# Patient Record
Sex: Female | Born: 1945
Health system: Southern US, Community
[De-identification: ages and names within clinical notes are randomized; demographics above are authoritative.]

## PROBLEM LIST (undated history)

## (undated) DIAGNOSIS — G4733 Obstructive sleep apnea (adult) (pediatric): Secondary | ICD-10-CM

## (undated) DIAGNOSIS — R931 Abnormal findings on diagnostic imaging of heart and coronary circulation: Secondary | ICD-10-CM

## (undated) DIAGNOSIS — I447 Left bundle-branch block, unspecified: Secondary | ICD-10-CM

## (undated) DIAGNOSIS — N393 Stress incontinence (female) (male): Secondary | ICD-10-CM

## (undated) DIAGNOSIS — J302 Other seasonal allergic rhinitis: Secondary | ICD-10-CM

## (undated) DIAGNOSIS — C801 Malignant (primary) neoplasm, unspecified: Secondary | ICD-10-CM

## (undated) DIAGNOSIS — F32A Depression, unspecified: Secondary | ICD-10-CM

## (undated) DIAGNOSIS — I2510 Atherosclerotic heart disease of native coronary artery without angina pectoris: Secondary | ICD-10-CM

## (undated) DIAGNOSIS — E785 Hyperlipidemia, unspecified: Secondary | ICD-10-CM

## (undated) DIAGNOSIS — I420 Dilated cardiomyopathy: Secondary | ICD-10-CM

## (undated) DIAGNOSIS — Z87442 Personal history of urinary calculi: Secondary | ICD-10-CM

## (undated) DIAGNOSIS — F419 Anxiety disorder, unspecified: Secondary | ICD-10-CM

## (undated) DIAGNOSIS — F329 Major depressive disorder, single episode, unspecified: Secondary | ICD-10-CM

## (undated) DIAGNOSIS — R413 Other amnesia: Secondary | ICD-10-CM

## (undated) DIAGNOSIS — D649 Anemia, unspecified: Secondary | ICD-10-CM

## (undated) DIAGNOSIS — I10 Essential (primary) hypertension: Secondary | ICD-10-CM

## (undated) DIAGNOSIS — R4586 Emotional lability: Secondary | ICD-10-CM

## (undated) DIAGNOSIS — H269 Unspecified cataract: Secondary | ICD-10-CM

## (undated) DIAGNOSIS — I2699 Other pulmonary embolism without acute cor pulmonale: Secondary | ICD-10-CM

## (undated) DIAGNOSIS — J45909 Unspecified asthma, uncomplicated: Secondary | ICD-10-CM

## (undated) DIAGNOSIS — I5042 Chronic combined systolic (congestive) and diastolic (congestive) heart failure: Secondary | ICD-10-CM

## (undated) DIAGNOSIS — R7303 Prediabetes: Secondary | ICD-10-CM

## (undated) DIAGNOSIS — E039 Hypothyroidism, unspecified: Secondary | ICD-10-CM

## (undated) DIAGNOSIS — R0989 Other specified symptoms and signs involving the circulatory and respiratory systems: Secondary | ICD-10-CM

## (undated) DIAGNOSIS — E162 Hypoglycemia, unspecified: Secondary | ICD-10-CM

## (undated) DIAGNOSIS — I252 Old myocardial infarction: Secondary | ICD-10-CM

## (undated) DIAGNOSIS — G4730 Sleep apnea, unspecified: Secondary | ICD-10-CM

## (undated) HISTORY — DX: Hyperlipidemia, unspecified: E78.5

## (undated) HISTORY — DX: Unspecified cataract: H26.9

## (undated) HISTORY — PX: CATARACT EXTRACTION W/ INTRAOCULAR LENS  IMPLANT, BILATERAL: SHX1307

## (undated) HISTORY — PX: DILATION AND CURETTAGE OF UTERUS: SHX78

## (undated) HISTORY — DX: Hypoglycemia, unspecified: E16.2

## (undated) HISTORY — DX: Other amnesia: R41.3

## (undated) HISTORY — PX: CARDIAC CATHETERIZATION: SHX172

## (undated) HISTORY — DX: Other pulmonary embolism without acute cor pulmonale: I26.99

## (undated) HISTORY — PX: EYE SURGERY: SHX253

## (undated) HISTORY — PX: TRANSTHORACIC ECHOCARDIOGRAM: SHX275

## (undated) HISTORY — DX: Emotional lability: R45.86

---

## 1979-02-28 HISTORY — PX: NASAL SEPTUM SURGERY: SHX37

## 1983-06-30 HISTORY — PX: VAGINAL HYSTERECTOMY: SUR661

## 1983-06-30 HISTORY — PX: ABDOMINAL HYSTERECTOMY: SHX81

## 1998-03-07 ENCOUNTER — Other Ambulatory Visit: Admission: RE | Admit: 1998-03-07 | Discharge: 1998-03-07 | Payer: Self-pay | Admitting: *Deleted

## 1998-12-22 ENCOUNTER — Ambulatory Visit: Admission: RE | Admit: 1998-12-22 | Discharge: 1998-12-22 | Payer: Self-pay | Admitting: Otolaryngology

## 1999-03-11 ENCOUNTER — Inpatient Hospital Stay (HOSPITAL_COMMUNITY): Admission: EM | Admit: 1999-03-11 | Discharge: 1999-03-12 | Payer: Self-pay | Admitting: *Deleted

## 1999-03-11 ENCOUNTER — Encounter: Payer: Self-pay | Admitting: Cardiovascular Disease

## 1999-06-23 ENCOUNTER — Encounter: Payer: Self-pay | Admitting: Emergency Medicine

## 1999-06-23 ENCOUNTER — Emergency Department (HOSPITAL_COMMUNITY): Admission: EM | Admit: 1999-06-23 | Discharge: 1999-06-23 | Payer: Self-pay | Admitting: Emergency Medicine

## 1999-06-24 ENCOUNTER — Encounter: Payer: Self-pay | Admitting: *Deleted

## 1999-06-24 ENCOUNTER — Emergency Department (HOSPITAL_COMMUNITY): Admission: EM | Admit: 1999-06-24 | Discharge: 1999-06-24 | Payer: Self-pay | Admitting: Emergency Medicine

## 1999-09-03 ENCOUNTER — Encounter: Payer: Self-pay | Admitting: *Deleted

## 1999-09-03 ENCOUNTER — Observation Stay (HOSPITAL_COMMUNITY): Admission: EM | Admit: 1999-09-03 | Discharge: 1999-09-05 | Payer: Self-pay | Admitting: *Deleted

## 1999-10-13 ENCOUNTER — Other Ambulatory Visit: Admission: RE | Admit: 1999-10-13 | Discharge: 1999-10-13 | Payer: Self-pay | Admitting: *Deleted

## 2000-08-26 ENCOUNTER — Emergency Department (HOSPITAL_COMMUNITY): Admission: EM | Admit: 2000-08-26 | Discharge: 2000-08-26 | Payer: Self-pay | Admitting: *Deleted

## 2000-08-27 ENCOUNTER — Encounter: Payer: Self-pay | Admitting: *Deleted

## 2000-08-30 ENCOUNTER — Encounter: Payer: Self-pay | Admitting: *Deleted

## 2000-08-31 ENCOUNTER — Observation Stay (HOSPITAL_COMMUNITY): Admission: RE | Admit: 2000-08-31 | Discharge: 2000-09-01 | Payer: Self-pay | Admitting: *Deleted

## 2000-08-31 ENCOUNTER — Encounter: Payer: Self-pay | Admitting: *Deleted

## 2000-08-31 HISTORY — PX: OTHER SURGICAL HISTORY: SHX169

## 2000-09-06 ENCOUNTER — Encounter: Admission: RE | Admit: 2000-09-06 | Discharge: 2000-09-06 | Payer: Self-pay | Admitting: *Deleted

## 2000-09-06 ENCOUNTER — Encounter: Payer: Self-pay | Admitting: *Deleted

## 2000-09-25 ENCOUNTER — Ambulatory Visit (HOSPITAL_COMMUNITY): Admission: RE | Admit: 2000-09-25 | Discharge: 2000-09-25 | Payer: Self-pay | Admitting: *Deleted

## 2000-09-25 ENCOUNTER — Encounter: Payer: Self-pay | Admitting: *Deleted

## 2001-01-06 ENCOUNTER — Encounter: Admission: RE | Admit: 2001-01-06 | Discharge: 2001-01-06 | Payer: Self-pay | Admitting: *Deleted

## 2001-01-06 ENCOUNTER — Encounter: Payer: Self-pay | Admitting: *Deleted

## 2002-08-01 ENCOUNTER — Other Ambulatory Visit: Admission: RE | Admit: 2002-08-01 | Discharge: 2002-08-01 | Payer: Self-pay | Admitting: Obstetrics and Gynecology

## 2003-10-16 ENCOUNTER — Emergency Department (HOSPITAL_COMMUNITY): Admission: EM | Admit: 2003-10-16 | Discharge: 2003-10-16 | Payer: Self-pay | Admitting: Emergency Medicine

## 2003-12-19 ENCOUNTER — Ambulatory Visit (HOSPITAL_COMMUNITY): Admission: RE | Admit: 2003-12-19 | Discharge: 2003-12-19 | Payer: Self-pay | Admitting: Internal Medicine

## 2003-12-27 ENCOUNTER — Encounter: Payer: Self-pay | Admitting: Internal Medicine

## 2003-12-27 ENCOUNTER — Ambulatory Visit (HOSPITAL_BASED_OUTPATIENT_CLINIC_OR_DEPARTMENT_OTHER): Admission: RE | Admit: 2003-12-27 | Discharge: 2003-12-27 | Payer: Self-pay | Admitting: Internal Medicine

## 2004-05-28 ENCOUNTER — Emergency Department (HOSPITAL_COMMUNITY): Admission: EM | Admit: 2004-05-28 | Discharge: 2004-05-29 | Payer: Self-pay | Admitting: Emergency Medicine

## 2004-08-22 ENCOUNTER — Inpatient Hospital Stay (HOSPITAL_COMMUNITY): Admission: AD | Admit: 2004-08-22 | Discharge: 2004-08-25 | Payer: Self-pay | Admitting: Cardiology

## 2004-08-26 ENCOUNTER — Emergency Department (HOSPITAL_COMMUNITY): Admission: EM | Admit: 2004-08-26 | Discharge: 2004-08-26 | Payer: Self-pay | Admitting: Emergency Medicine

## 2004-09-24 ENCOUNTER — Emergency Department (HOSPITAL_COMMUNITY): Admission: EM | Admit: 2004-09-24 | Discharge: 2004-09-24 | Payer: Self-pay | Admitting: Emergency Medicine

## 2005-06-16 ENCOUNTER — Ambulatory Visit: Payer: Self-pay | Admitting: Family Medicine

## 2006-04-26 ENCOUNTER — Emergency Department (HOSPITAL_COMMUNITY): Admission: EM | Admit: 2006-04-26 | Discharge: 2006-04-26 | Payer: Self-pay | Admitting: Emergency Medicine

## 2009-06-29 DIAGNOSIS — I219 Acute myocardial infarction, unspecified: Secondary | ICD-10-CM

## 2009-06-29 HISTORY — DX: Acute myocardial infarction, unspecified: I21.9

## 2009-11-27 DIAGNOSIS — I252 Old myocardial infarction: Secondary | ICD-10-CM

## 2009-11-27 HISTORY — DX: Old myocardial infarction: I25.2

## 2009-12-07 ENCOUNTER — Inpatient Hospital Stay (HOSPITAL_COMMUNITY): Admission: EM | Admit: 2009-12-07 | Discharge: 2009-12-09 | Payer: Self-pay | Admitting: Emergency Medicine

## 2009-12-08 ENCOUNTER — Encounter (INDEPENDENT_AMBULATORY_CARE_PROVIDER_SITE_OTHER): Payer: Self-pay | Admitting: Cardiovascular Disease

## 2009-12-08 ENCOUNTER — Ambulatory Visit: Payer: Self-pay | Admitting: Vascular Surgery

## 2009-12-11 ENCOUNTER — Emergency Department (HOSPITAL_COMMUNITY): Admission: EM | Admit: 2009-12-11 | Discharge: 2009-12-12 | Payer: Self-pay | Admitting: Emergency Medicine

## 2010-09-15 LAB — CBC
HCT: 30.9 % — ABNORMAL LOW (ref 36.0–46.0)
HCT: 34.8 % — ABNORMAL LOW (ref 36.0–46.0)
Hemoglobin: 10.3 g/dL — ABNORMAL LOW (ref 12.0–15.0)
Hemoglobin: 11.8 g/dL — ABNORMAL LOW (ref 12.0–15.0)
MCHC: 33 g/dL (ref 30.0–36.0)
MCHC: 33.1 g/dL (ref 30.0–36.0)
MCHC: 33.4 g/dL (ref 30.0–36.0)
MCV: 79.8 fL (ref 78.0–100.0)
MCV: 80.6 fL (ref 78.0–100.0)
Platelets: 206 10*3/uL (ref 150–400)
Platelets: 246 10*3/uL (ref 150–400)
RBC: 5.24 MIL/uL — ABNORMAL HIGH (ref 3.87–5.11)
RDW: 14.6 % (ref 11.5–15.5)
RDW: 14.6 % (ref 11.5–15.5)
RDW: 14.7 % (ref 11.5–15.5)
WBC: 7.4 10*3/uL (ref 4.0–10.5)

## 2010-09-15 LAB — HEPARIN LEVEL (UNFRACTIONATED)
Heparin Unfractionated: 0.19 IU/mL — ABNORMAL LOW (ref 0.30–0.70)
Heparin Unfractionated: 0.37 IU/mL (ref 0.30–0.70)

## 2010-09-15 LAB — DIFFERENTIAL
Band Neutrophils: 0 % (ref 0–10)
Basophils Relative: 2 % — ABNORMAL HIGH (ref 0–1)
Eosinophils Absolute: 0.1 10*3/uL (ref 0.0–0.7)
Eosinophils Absolute: 0.4 10*3/uL (ref 0.0–0.7)
Eosinophils Relative: 1 % (ref 0–5)
Eosinophils Relative: 5 % (ref 0–5)
Metamyelocytes Relative: 0 %
Monocytes Absolute: 0.1 10*3/uL (ref 0.1–1.0)
Monocytes Relative: 2 % — ABNORMAL LOW (ref 3–12)
Monocytes Relative: 8 % (ref 3–12)
Neutrophils Relative %: 72 % (ref 43–77)

## 2010-09-15 LAB — BASIC METABOLIC PANEL
BUN: 11 mg/dL (ref 6–23)
BUN: 8 mg/dL (ref 6–23)
CO2: 25 mEq/L (ref 19–32)
CO2: 30 mEq/L (ref 19–32)
Calcium: 8.8 mg/dL (ref 8.4–10.5)
Calcium: 8.8 mg/dL (ref 8.4–10.5)
Chloride: 111 mEq/L (ref 96–112)
Creatinine, Ser: 0.68 mg/dL (ref 0.4–1.2)
Creatinine, Ser: 0.78 mg/dL (ref 0.4–1.2)
Creatinine, Ser: 0.81 mg/dL (ref 0.4–1.2)
GFR calc Af Amer: >60 mL/min (ref >60)
GFR calc non Af Amer: >60 mL/min (ref >60)
GFR calc non Af Amer: >60 mL/min (ref >60)
Glucose, Bld: 105 mg/dL — ABNORMAL HIGH (ref 70–99)
Glucose, Bld: 80 mg/dL (ref 70–99)
Potassium: 2.5 mEq/L — CL (ref 3.5–5.1)
Sodium: 143 mEq/L (ref 135–145)

## 2010-09-15 LAB — POCT CARDIAC MARKERS
Myoglobin, poc: 298 ng/mL (ref 12–200)
Myoglobin, poc: 45.6 ng/mL (ref 12–200)

## 2010-09-15 LAB — GLUCOSE, CAPILLARY: Glucose-Capillary: 109 mg/dL — ABNORMAL HIGH (ref 70–99)

## 2010-09-15 LAB — URINALYSIS, ROUTINE W REFLEX MICROSCOPIC
Ketones, ur: NEGATIVE mg/dL
Nitrite: NEGATIVE
Protein, ur: NEGATIVE mg/dL
Urobilinogen, UA: 0.2 mg/dL (ref 0.0–1.0)
pH: 7.5 (ref 5.0–8.0)

## 2010-09-15 LAB — CARDIAC PANEL(CRET KIN+CKTOT+MB+TROPI)
Relative Index: 8.5 — ABNORMAL HIGH (ref 0.0–2.5)
Total CK: 300 U/L — ABNORMAL HIGH (ref 7–177)
Total CK: 531 U/L — ABNORMAL HIGH (ref 7–177)
Troponin I: 5.05 ng/mL (ref 0.00–0.06)
Troponin I: 5.33 ng/mL (ref 0.00–0.06)

## 2010-09-15 LAB — LIPID PANEL
LDL Cholesterol: 120 mg/dL — ABNORMAL HIGH (ref 0–99)
Total CHOL/HDL Ratio: 3.8 RATIO
VLDL: 24 mg/dL (ref 0–40)

## 2010-09-15 LAB — CULTURE, BLOOD (ROUTINE X 2): Culture: NO GROWTH

## 2010-09-15 LAB — HEPATIC FUNCTION PANEL
AST: 34 U/L (ref 0–37)
Albumin: 3.6 g/dL (ref 3.5–5.2)
Alkaline Phosphatase: 52 U/L (ref 39–117)
Bilirubin, Direct: 0.1 mg/dL (ref 0.0–0.3)
Total Bilirubin: 0.5 mg/dL (ref 0.3–1.2)

## 2010-09-15 LAB — D-DIMER, QUANTITATIVE: D-Dimer, Quant: 0.5 ug/mL-FEU — ABNORMAL HIGH (ref 0.00–0.48)

## 2010-09-15 LAB — TROPONIN I: Troponin I: 3.28 ng/mL (ref 0.00–0.06)

## 2010-09-15 LAB — RAPID URINE DRUG SCREEN, HOSP PERFORMED
Cocaine: NOT DETECTED
Tetrahydrocannabinol: NOT DETECTED

## 2010-09-15 LAB — CK TOTAL AND CKMB (NOT AT ARMC): CK, MB: 20.6 ng/mL (ref 0.3–4.0)

## 2010-09-15 LAB — HEMOGLOBIN A1C: Hgb A1c MFr Bld: 6.3 % — ABNORMAL HIGH (ref <5.7)

## 2010-09-15 LAB — PROTIME-INR: INR: 1.02 (ref 0.00–1.49)

## 2010-09-15 LAB — TSH: TSH: 0.637 u[IU]/mL (ref 0.350–4.500)

## 2010-11-14 NOTE — Op Note (Signed)
[facility]  Patient:    Laura [name], Laura [name]                      MRN: [id number] Proc. Date: 08/31/00 Adm. Date:  [id number] Disc. Date: [id number] Attending:  Dalbert Mayotte                           Operative Report  PREOPERATIVE DIAGNOSES: 1. Right ureteral calculus. 2. Left renal calculus. 3. Chronic back pain secondary to degenerative spine disease. 4. Sleep apnea.  POSTOPERATIVE DIAGNOSES: 1. Right ureteral calculus. 2. Left renal calculus. 3. Chronic back pain secondary to degenerative spine disease. 4. Sleep apnea.  OPERATIVE PROCEDURES: 1. Cystoscopy. 2. Right ureteroscopic stone extraction. 3. Insertion of double J ureteral stent (#6 Kwort).  ANESTHESIA:  General.  DESCRIPTION OF PROCEDURE:  This patient age 65, was brought to the operating room and underwent successful induction of general anesthesia and was prepped and draped in the lithotomy position. Using the #22 cystourethroscope, the #70 and #12 degree lenses, the bladder was carefully inspected and no stone, tumor or ulcer was noted in the bladder. She had mild hyperemia and erythema of the bladder. The ureteral orifices appeared normal. The right ureteral orifice accepted an 0.38 Glidewire through a #5 open ended catheter and it passed easily under fluoroscopic control to the region of the right renal pelvis. The open ended catheter was advanced beyond the stone which was about 10 cm above the right ureterovesical junction and dye was injected and outlined a dilated upper ureter and renal pelvis with some blooding of the calices. The open ended catheter was then used to introduce an 0.35 spring guidewire which was used as a safety wire and was attached to the drapes with a forceps. After dilating the lower ureter for 5 minutes with a UroMax 10 cm x 15 mm balloon. The 9.5 short ureteroscope was then passed alongside the safety wire up to the stone, the stone was engaged  with the 15 mm Pfister-Scwartz basket and extracted on the first pass. The safety wire was then used to introduce the open ended catheter and a pyeloureterogram was then performed which showed no extravasation and again showed dilation of the upper ureter and renal collecting system on the right, but no extravasation was noted. The open ended catheter was then used to insert an 0.38 Glidewire and the over 0.38 Glidewire was passed a #6 Kwort type double J ureteral stent under fluoroscopic control and it was noted to have a good curl in the right renal pelvis and distally cystoscopically, it was noted to have a good curl in the bladder. The pull-string was taped to the suprapubic area. The patients bladder was emptied and she was returned to the recovery room.  DISPOSITION:  The plan is to observe the patient overnight since she has sleep apnea. We will give her Tylox for pain, IV fluids, Cipro and if she is stable and afebrile, she will go home September 01, 2000 and return to the office in a few days to have her stent removed. DD:  08/31/00 TD:  09/01/00 Job: [id number]

## 2010-11-14 NOTE — Discharge Summary (Signed)
NAMESHADI, LARNER               ACCOUNT NO.:  [id number]   MEDICAL RECORD NO.:  [id number]          PATIENT TYPE:  INP   LOCATION:  2024                         FACILITY:  [facility]   PHYSICIAN:  Cristy Hilts. Jacinto Halim, MD       DATE OF BIRTH:  17-Jun-1946   DATE OF ADMISSION:  08/22/2004  DATE OF DISCHARGE:  08/25/2004                                 DISCHARGE SUMMARY   Ms. Danalee Flath is a 65 year old female patient seen in the office by Dr.  Elsie Lincoln and Abelino Derrick, P.A.  She was having chest pain intermittently  for several days.  Thus, she was admitted to the hospital.  She ruled out  for MI.  She went on to have a cardiac catheterization on August 25, 2004.  She was found to have normal coronaries and normal LV function.  She did  have some high blood pressure on her admission.  She was put on Norvasc.  She had a lot of complaints with her back pain, which was a chronic issue.  Her total cholesterol was found to be very elevated.  Thus, she was put on  Lipitor medication.  On the day of discharge, she had been perclosed.  Her  discharge is pending getting up and walking and finishing her bed rest.   LABORATORIES:  CK-MBs were negative.  Her TSH was 0.762.  Her sodium was  138, potassium 3.7, chloride 105, glucose 105, BUN 11 and creatinine 0.7.  Her total cholesterol was 252, her LDL was 168, her LDL was 56 and her  triglycerides were 142.   DISCHARGE MEDICATIONS:  1.  Protonix 4 mg one time per day.  2.  Lipitor 40 mg one time per day.  3.  Motrin or ibuprofen 400 mg three times a day for four days.  4.  Norvasc 5 mg one time per day.   ACTIVITY:  She is to do lifting, pushing, pulling or other strenuous  activity x 1 week.   WOUND CARE:  If she has any problems with her groin, please call.  She  should do not tub baths or sitting in pool of water for one week.   FOLLOWUP:  She will follow up on August 28, 2004, for a groin check.   DISCHARGE DIAGNOSES:  1.  Chest pain,  noncardiac in origin with normal coronary arteries.  2.  Hypertension.  3.  Hyperlipidemia.  4.  Asthma.  5.  Chronic back pain.      BB/MEDQ  D:  08/25/2004  T:  08/25/2004  Job:  [id number]   cc:   Ike Bene, M.D.  [address]  [place]  [place] [address]  Fax: [phone]

## 2010-11-14 NOTE — Discharge Summary (Signed)
[place]  Patient:    Laura [name], Laura [name]                      MRN: [id number] Adm. Date:  [id number] Disc. Date: [id number] Attending:  Ruta Hinds Dictator:   [facility] Celina, P.A. CCDaleen Bo R. Felipa Eth, M.D.             Richard A. Alanda Amass, M.D.                           Discharge Summary  ADMISSION DIAGNOSES: 1. Chest pain. 2. Hypertension. 3. Hyperlipidemia. 4. Obesity. 5. Anxiety disorder. 6. Sleep apnea. 7. Status post total abdominal hysterectomy. 8. Nephrolithiasis.  DISCHARGE DIAGNOSES: 1. Chest pain. 2. Hypertension. 3. Hyperlipidemia. 4. Obesity. 5. Anxiety disorder. 6. Sleep apnea. 7. Status post total abdominal hysterectomy. 8. Nephrolithiasis. 9. Status post cardiac catheterization with normal coronary arteries.  HISTORY OF PRESENT ILLNESS:  Laura [name] is a 65 year old white female with no known heart disease, with a negative cardiac for ischemia and scar in September, 2000, who presented to [facility] Emergency Room on September 03, 1999, with complaints of chest pain.  She stated that she was on her way to work thriving when she had sudden onset of heavy sharp pain in her chest.  She said it was a 10/10.  It was continuous. She sat up straight without relief.  She continued driving to work.  Once at work, he called EMS.  She was given aspirin 81 mg x 4 and one nitroglycerin with some relief of her symptoms.  This past fall, she had some symptoms of chest pain.  She had heaviness only. he then had a negative Cardiolite.  Since the fall, she had done well.  She saw Dr. Felipa Eth on the previous last Monday and had been started on antihypertensives nd statins.  On exam at that time, her blood pressure was 165/95, pulse 73.  Her exam was essentially benign.  Her EKG revealed normal sinus rhythm with nonspecific ST changes.  Her enzymes were pending at that time.  She was started on  aspirin products, heparin, as well as Imdur 30.  As well, she was started on Prevacid 30 b.i.d. for GI prophylaxis.  Otherwise, she was continued on her general home medications and was also started on Altace 2.5 q.d. and Lopressor 12.5 b.i.d. he was planned for cardiac catheterization the following day.  HOSPITAL COURSE:  On September 04, 1999, Laura [name] underwent cardiac catheterization by Dr. Susa Griffins.  She was found to have completely normal coronary arteries and normal LV function with EF of 55%.  On the morning of September 05, 1999, Laura [name] is without complaints.  She is afebrile.  Her blood pressure has been 115 to 150 systolically and 60 to 70 diastolic.  Heart rates in the 70s.  Oxygen saturations have been 98% on room air. Her lungs are clear.  Her heart is in a regular rhythm without murmurs, rubs, or gallops.  Her groin has no ecchymosis or hematoma and she has 2+ distal pulses.  Telemetry continues to show normal sinus rhythm.  It is felt that her chest pain was noncardiac in etiology.  We will plan for her to be discharged home today with GI prophylactic therapy and follow up with Dr. Felipa Eth.  HOSPITAL CONSULTS:  None.  HOSPITAL PROCEDURES: 1. Cardiac catheterization  on September 04, 1999, by Dr. Susa Griffins revealing    normal coronary arteries and normal left ventricular function. 2. EKG revealed normal sinus rhythm with nonspecific ST-T wave changes, no acute    change.  HOSPITAL LABS:  BMP is completely normal as well as CBC.  As well, her TSH is normal at 1.512.  Lipid profile revealed a total cholesterol of 198, triglyceride 137, HDL 45, LDL 126.  DISCHARGE MEDICATIONS: 1. Prevacid 15 mg one p.o. b.i.d. 2. Zocor 40 mg each night. 3. Effexor 37.5 mg two each day. 4. Valium 10 mg twice a day.  ACTIVITY:  No strenuous activity, lifting greater than 5 pounds, driving, or sexual activity for three days.  WOUND CARE:  Patient may wash her groin  with warm water and soap.  FOLLOW-UP:  Call the office at [phone] if any bleeding or increased size or pain of the groin.  Make an appointment to see Dr. Felipa Eth in one to two weeks. DD:  09/05/99 TD:  09/06/99 Job: [id number]

## 2010-11-14 NOTE — Cardiovascular Report (Signed)
Laura, [name]               ACCOUNT NO.:  [id number]   MEDICAL RECORD NO.:  [id number]           PATIENT TYPE:  INP   LOCATION:                               FACILITY:  [facility]   PHYSICIAN:  Vonna Kotyk R. Jacinto Halim, MD       DATE OF BIRTH:  1945/09/08   DATE OF PROCEDURE:  08/25/2004  DATE OF DISCHARGE:                              CARDIAC CATHETERIZATION   PROCEDURES PERFORMED:  1.  Left ventriculography.  2.  Left coronary arteriography.  3.  Ascending aortogram.  4.  Right femoral angiography and closure with right femoral artery access      with Angioseal.   INDICATIONS:  [name] is a 65 year old Caucasian female with  history of anxiety disorder who has had prior cardiovascular evaluation for  chest pain.  She was seen by Dr. Elsie Lincoln in the office with recurrent chest  discomfort on August 22, 2004, and she was found to have a new-onset of  left bundle branch block.  Given her abnormal EKG and although the chest  pain was atypical, was referred for further cardiovascular evaluation.  After myocardial infarction was ruled out, she was brought to the Cardiac  Catheterization Lab for definitive diagnosis of coronary disease.  Ascending  aortogram was performed to evaluate for aortic dissection, for evaluation of  chest pain.   HEMODYNAMIC DATA:  The left ventricular pressures were 120/10 with end-  diastolic pressure of 15 mmHg.  The aortic pressure was 114/70 with a mean  of 89 mmHg.  There was no pressure gradient across the aortic valve.   ANGIOGRAPHIC DATA:  Left ventricle.  The left ventricular systolic function  was normal and the ejection fraction was estimated at 55-60%.  There was no  significant mitral regurgitation.   Right coronary artery.  This a large caliber vessel, no dominant vessel.  It  gives origin to large PDA and PLA branches.  It is normal.   Left main coronary artery.  The left main coronary artery is a large caliber  vessel.  It is normal.   Circumflex.  The circumflex is a large caliber vessel.  It gives origin to  large obtuse marginal one, continues to the large obtuse marginal two.  It  is normal.   Left anterior descending coronary artery is a large caliber vessel.  It  gives origin to large diagonal one and a modest diagonal two and ends at the  apex.  It is mildly tortuous in its distal end, but the vessel is smooth and  normal.   IMPRESSIONS:  1.  Normal left ventricular systolic function ejection fraction 55-60%.  No      significant mitral regurgitation.  Normal left ventricular end-diastolic      pressure.  2.  Normal coronary arteries with right dominant circulation.  3.  Ascending aortogram revealing the presence of three aortic valve cusps,      no evidence of aortic dissection or aortic regurgitation.   RECOMMENDATIONS:  Evaluation for noncardiac cause of chest pain is  indicated.  Reassurance is indicated.  The patient will be  started on  nonsteroidal anti-inflammatories for pleuritic type of chest pain.   TECHNIQUE OF THE PROCEDURE:  Under usual sterile precautions, using a 6  French right femoral artery access, a 6 [place] multipurpose B2 catheter was  advanced into the ascending artery with a 0.035 JL.  The catheter was gently  advanced into the left ventricle.  Left ventricular pressure was monitored.  Hand contrast was performed both in the LAO and RAO projection.  The  catheter was flushed and pulled back into the ascending aorta and PG was  monitored.  Right coronary artery was selectively engaged angiography was  performed.  Then the catheter was utilized to perform the aortic root  injection in the LAO projection.  Then the catheter was pulled in the body  in the usual fashion.  A 6 French Judkins left 4 diagnostic catheter was  utilized to engage the left main coronary artery and angiography was  repeated.  Then the catheter was pulled back in the usual fashion.  Right  femoral angiography was  performed through the arterial access sheath and the  access was closed with Angioseal with excellent hemostasis obtained and the  patient was transferred to the recovery in stable condition.      JRG/MEDQ  D:  08/25/2004  T:  08/25/2004  Job:  [id number]   cc:   Madaline Savage, M.D.  [address]  [place]  [place] [address]  Fax: [phone]

## 2010-11-14 NOTE — H&P (Signed)
[facility]  Patient:    Laura [name], Laura [name]                      MRN: [id number] Adm. Date:  [id number] Disc. Date: [id number] Attending:  Carmelina Peal                         History and Physical  REASON FOR ADMISSION:  This 65 year old female is referred to [facility] for cystoscopy, possible right ureteroscopy, possible stent, possible stone extraction, with a chief complaint of severe pain in the left side, onset August 26, 2000.  PRESENT ILLNESS:  This 65 year old female has had stones dating back to 1974 and has been seen on a number of occasions in our office.  She passed a stone from the left side in December of 2000 and had done reasonably well, she states, until August 26, 2000.  She had severe right-sided pain and presented herself in the [facility] Emergency Room, where a CT scan suggested a 5-mm calculus, upper right ureter, with hydronephrosis on the right and no hydronephrosis on the left, but she did have a calcification in the left kidney, which had been previously noted.  The patient has had some frequency and nocturia, voiding every couple of hours pretty much night and day, but she has not seen gross blood, gravel or stone in the urine.  She has had no fever. She was started on Cipro in the [facility] Emergency Room and given Percocet for pain, which she has continued to use and if she does not take her Percocet regularly, her pain recurs.  She was seen in our office on August 30, 2000. Ultrasound at that time revealed grade 1 to 2 hydronephrosis on the right with dilatation of the pelvis and upper right ureter.  There was no dilatation of the left kidney but the calculus was demonstrated and she was advised to have cystoscopy and insertion of stent or ureteroscopy, depending on the location and size of the stone, and this is scheduled for [facility] on August 31, 2000.  ALLERGIES:  CODEINE.  PRESENT MEDICATIONS 1. Maxidone one q.4h. for  chronic back pain. 2. Percocet one or two q.4-6h. p.r.n. for renal colic. 3. Cipro 500 mg b.i.d. 4. Valium 10 mg p.r.n. 5. Zoloft 50 mg p.r.n. 6. She has recently completed a course of Medrol and Vioxx. 7. She sometimes uses ibuprofen.  PAST MEDICAL HISTORY:  She has had sleep apnea and upper respiratory infections in the past associated with headaches.  She has also had chronic degenerative disk disease since at least 1998.  She had a hysterectomy using laparoscopic technique and had had a previous history of tubal ligation, also laparoscopic.  She has had surgery for deviated septum by Dr. Kristine Garbe. Ezzard Standing and she was evaluated by Dr. Izell [place]. [place] in 1998 regarding her back problems with MRI.  REVIEW OF SYSTEMS:  General health has been poor in recent years.  She has had increasing weight problems and back problems.  HEENT:  She has frequent sinusitis, sleep apnea and headaches.  CARDIORESPIRATORY:  She denies any chest pain or heart attack and has never been a smoker.  She was evaluated once for irregular heart beat and chest pains which turned out to be esophageal reflux.  GI:  She denies any hepatitis.  She has had GERD.  Her bowels are sometimes hard to move, especially when she is on pain medicine. BONES,  JOINTS AND MUSCLES:  She does have some mild arthritis and severe back degeneration.  NEUROPSYCHIATRIC:  She denies any stroke, fainting or falling-out spells but does have headaches, depression and chronic back pain, as noted above.  FAMILY HISTORY:  Positive for heart attack in the father who died at age 35. Mother living, in her 58s.  She has one brother and one sister and two children.  To her knowledge, there is no diabetes in the family but there are stones and cancer in her grandfather, hypertension in her mother and kidney disease in father, sister and daughter.  SOCIAL HISTORY:  She does not abuse alcohol or tobacco.  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  PHYSICAL EXAMINATION  GENERAL:  Physical examination reveals a 65 year old female who has pain in her right side.  VITAL SIGNS:  Blood pressure is 170/86, pulse 88, temperature 97.8, respirations 16.  HEENT:  The ears and tympanic membranes are unremarkable.  Eyes react normally to light and accommodation.  Extraocular movements intact.  Pharynx benign. Teeth in fair condition.  Tongue is dry.  NECK:  No enlargement of thyroid.  No nodes are palpable.  CHEST:  Clear to percussion and auscultation.  HEART:  Normal sinus rhythm.  No murmur.  Not enlarged.  BREASTS:  Not examined.  ABDOMEN:  Mildly to moderately obese.  No masses or tenderness except for a little deep right flank tenderness.  She has laparoscopic scars from her surgery.  PELVIC/RECTAL:  Examination reveals the uterus to be absent.  The vagina and vulva are normal.  The perineum and anal areas are normal.  The rectal tone is good.  No rectal or pelvic masses are detected.  EXTREMITIES:  No edema.  Good peripheral pulses.  NEUROLOGIC:  Grossly normal reflexes and sensation.  IMPRESSION 1. Right ureteral calculus with hydronephrosis. 2. Left renal calculus. 3. Recurrent urinary tract infections. 4. Chronic cystitis. 5. Sleep apnea. 6. Depression. 7. Degenerative spine disease.  PLAN:  Cystoscopy and insertion of right ureteral stent or ureteroscopic stone extraction depending on size and location of stone. DD:  08/30/01 TD:  08/31/00 Job: [id number]

## 2010-11-14 NOTE — H&P (Signed)
[place]. [facility]  Patient:    Laura [name], Laura [name]                      MRN: [id number] Adm. Date:  [id number] Attending:  Ruta Hinds Dictator:   Marya Fossa, P.A. CC:         Richard Mervyn Skeeters. Alanda Amass, M.D.             Ravi R. Felipa Eth, M.D.                         History and Physical  ADMISSION DIAGNOSES: 1. Chest pain, rule out myocardial infarction. 2. Hypertension. 3. Hyperlipidemia. 4. Obesity. 5. Family history of coronary artery disease. 6. History of anxiety disorder.  CHIEF COMPLAINT:  Chest pain.  HISTORY OF PRESENT ILLNESS:  This is a 65 year old white female with no prior known coronary artery disease who had a negative Cardiolite in September 2000 for ischemia and scar.  She does have risk factors for coronary artery disease, including family history, hypertension, hyperlipidemia, obesity.  She is a nonsmoker.  On the way to work today while driving she had the sudden onset of heavy sharp ain in the center of her chest about a 10/10 which was continuous.  There were no associated symptoms.  She tried to sit up straighter to get relief and got none. She thought this may have been indigestion and continued driving to work.  Once at work she called EMS.  She was given four baby aspirin and one nitroglycerin with some relief of her symptoms.  The patient was seen in the emergency department last fall for complaints of chest pain, different in nature, however, with heaviness only in the center of the chest. She was admitted for observation during that hospitalization, and had a Cardiolite which was negative for ischemia.  Since the fall she has done well and has had o other symptoms.  She saw Dr. Felipa Eth last Monday and was started on an antihypertensive and Statin therapy.  She denies diabetes.  ALLERGIES:  CODEINE.  MEDICATIONS: 1. Effexor 37.5 mg two a day. 2. Valium 10 mg b.i.d. 3. Zocor 40 mg q.h.s. 4. Altace 2.5 mg  q.d.  PAST MEDICAL HISTORY: 1. Sleep apnea. 2. Hypertension. 3. Hyperlipidemia. 4. Anxiety disorder. 5. History of nephrolithiasis. 6. History of total abdominal hysterectomy.  SOCIAL HISTORY:  The patient is married, mother of two, grandmother of one, who  denies tobacco or alcohol use.  She works full-time at [employer].  FAMILY HISTORY:  Mother has hypertension.  Father deceased at age 44 of an myocardial infarction.  One brother without heart disease.  Strong paternal family history of heart disease.  REVIEW OF SYSTEMS:  Occasional dizziness.  Age-related vision changes. Occasional headache.  Positive reflux.  Negative syncope.  Negative presyncope.  Negative abdominal pain.  Denies hematuria, dysuria, melena, seizure disorder, or cerebrovascular accident.  Has had increased urine frequency recently.  PHYSICAL EXAMINATION: VITAL SIGNS:  Blood pressure 155/95, pulse 73, respirations 20.  The patient has taken her medication this morning. GENERAL:  The patient is alert and oriented x 3 and in no acute distress laying on a stretcher in the emergency room.  Her husband is in attendance. HEENT:  Normocephalic, atraumatic.  Pupils are equal, round and reactive to light. Extraocular movements intact.  Nares patent.  Pharynx benign. NECK:  Supple without bruits. LUNGS:  Clear to auscultation. HEART:  Regular  rate and rhythm without murmurs, rubs, or gallops. ABDOMEN:  Soft, nontender, nondistended, normal bowel sounds x 4 quadrants. EXTREMITIES:  There are 2+ femoral pulses bilaterally without bruits, 2+ tibial  pulses bilaterally without edema.  LABORATORY DATA:  EKG shows normal sinus rhythm without acute abnormality. Enzymes are pending.  The patient is examined by Dr. Alanda Amass.  IMPRESSION AND PLAN:  Atypical chest pain with multiple cardiac risk factors, but no known coronary artery disease.  We will plan to admit the patient, start intravenous  heparin per pharmacy protocol.  We will give her Imdur, start low dose beta blocker therapy, and continue Altace.  We will add aspirin and Plavix.  We  will check cardiac enzymes to rule out myocardial infarction, and plan cardiac catheterization tomorrow at 1:30. DD:  09/03/99 TD:  09/03/99 Job: [id number]

## 2010-11-14 NOTE — Cardiovascular Report (Signed)
[facility]  Patient:    Laura [name], Laura [name]                      MRN: [id number] Proc. Date: 09/04/99 Adm. Date:  [id number] Disc. Date: [id number] Attending:  Ruta Hinds CC:         Lennon Alstrom. Felipa Eth, M.D.             Richard A. Alanda Amass, M.D.             Cardiac Catheterization Lab                        Cardiac Catheterization  PROCEDURES: 1. Retrograde central aortic catheterization. 2. Selective coronary angiography by Judkins technique. 3. Left ventricular angiogram, LAO and RAO projection. 4. Abdominal aortic angiogram, hand injection, PA projection.  DESCRIPTION OF PROCEDURE:  The patient brought to the second floor cardiopulmonary lab in a postabsorptive state after 5 mg Valium p.o. premedication.  She was given 2 mg Versed and allowed for sedation, then 1% Xylocaine was used for local anesthesia.  The RFA was entered with a single anterior puncture, using an 18-gauge thin-walled needle and a 6-French short Terumo sidearm sheath was inserted without difficulty.  Catheterization was performed with 6-French 4 cm taper, preformed Cordis coronary and pigtail catheters; using Visipaque dye throughout the procedure.  LV angiogram was done by hand injection in the LAO and 25 cc at 14 cc/sec in the RAO projection.  Pullback pressures CA was performed; showed no gradient across the aortic valve.  Abdominal aortic angiogram was done in the midstream PA projection by hand, above the level of the renal arteries; demonstrated single, normal, widely patent renal arteries an no significant inferorenal atherosclerotic cirrhosis.  Catheters removed and sidearm sheath was flushed.  Hand injection of the right femoral artery was done; showing entrance  site into the RCFA, with a good, smooth vessel.  The right femoral artery was closed with a 6-French single stitch.  Perclose device successfully under 1% Xylocaine anesthesia, with sterile  re-prepping.  The patient was transported to the holding area for postoperative care, in stable condition.  PRESSURES: 1. Left ventricular pressure:  170/0. 2. Left ventricular end-diastolic pressure:  14/16 mmHg. 3. Central aortic pressure:  170/85 mmHg. 4. There is no gradient across the aortic valve on catheter pullback.  FLUOROSCOPY:  Did not reveal any coronary intracardiac or valvular calcification.  LEFT VENTRICULOGRAM:  (RAO and LAO projections)  Showed normally contracting ventricle, with EF greater than 55%.  No mitral regurgitation or segmental wall  motion abnormality.  CORONARIES: 1. LEFT MAIN:  Normal. 2. LEFT ANTERIOR DESCENDING:  Widely patent and smooth throughout its course. t    gave off two large septal perforators proximally, three moderate-sized    diagonals from the proximal mid vessel (which were normal). 3. CIRCUMFLEX:  Nondominant, moderate-sized vessel, with a normal marginal and    normal circumflex proper.  This bifurcated distally with normal PAVG branch. 4. RIGHT CORONARY:  A large, widely patent, smooth vessel with normal bifurcating    CLA and normal PDA.  DISCUSSION:  Laura [name] is a 65 year old white married mother of two, and one  grandchild.  She is a nonsmoker.  I have seen her many years ago for symptoms of palpitations, treated with reassurance.  She was admitted to the hospital on September 2000 for chest pain; myocardial infarction was ruled out and an outpatient Cardiolite showed  no ischemia.  She came back in on September 03, 1999 for prolonged episode of substernal chest pain while going to work; relieved with nitroglycerin by the Laura [name].  Myocardial infarction was ruled out by serial enzymes and EKG.  Recent diagnosis of hypertension and hyperlipidemia by Dr. Felipa Eth.  There is a strong family history of coronary disease, with her father dying of MI at the age of 25.  Fortunately, cardiac catheterization shows entirely  normal coronary arteries and left ventricle.  She has systemic hypertension, with normal renal arteries.  RECOMMENDATION:  I would recommend continued treatment of her hypertension medically, weight reduction and an exercise program.  Fortunately she is not a smoker.  Follow up lipids (per Dr. Felipa Eth).  Empiric GI therapy, since the most likely cause of her chest symptoms is upper GI, GERD or spasm.  CATHETERIZATION DIAGNOSES: 1. Chest pain, etiology not determined. 2. Normal coronary arteries of the left ventricle. 3. Systemic hypertension, normal renal arteries. 4. Anxiety disorder, on Effexor (trying to get off this now) and chronic Valium    therapy. 5. Exogenous obesity. 6. Hyperlipidemia. 7. Systemic hypertension. 8. Family history of coronary disease. DD:  09/04/99 TD:  09/05/99 Job: [id number]

## 2010-11-21 LAB — HM COLONOSCOPY

## 2011-05-19 ENCOUNTER — Other Ambulatory Visit (HOSPITAL_COMMUNITY): Payer: Self-pay | Admitting: Internal Medicine

## 2011-05-19 DIAGNOSIS — Z1231 Encounter for screening mammogram for malignant neoplasm of breast: Secondary | ICD-10-CM

## 2011-05-19 DIAGNOSIS — Z0000 Encounter for general adult medical examination without abnormal findings: Secondary | ICD-10-CM

## 2011-06-18 ENCOUNTER — Other Ambulatory Visit: Payer: Self-pay

## 2011-06-18 ENCOUNTER — Emergency Department (HOSPITAL_COMMUNITY): Payer: Medicare Other

## 2011-06-18 ENCOUNTER — Observation Stay (HOSPITAL_COMMUNITY)
Admission: EM | Admit: 2011-06-18 | Discharge: 2011-06-19 | Disposition: A | Discharge status: Home / Self Care | Payer: Medicare Other | Attending: Cardiovascular Disease | Admitting: Cardiovascular Disease

## 2011-06-18 DIAGNOSIS — I509 Heart failure, unspecified: Secondary | ICD-10-CM | POA: Insufficient documentation

## 2011-06-18 DIAGNOSIS — R079 Chest pain, unspecified: Principal | ICD-10-CM | POA: Insufficient documentation

## 2011-06-18 DIAGNOSIS — R0602 Shortness of breath: Secondary | ICD-10-CM | POA: Insufficient documentation

## 2011-06-18 DIAGNOSIS — E876 Hypokalemia: Secondary | ICD-10-CM | POA: Insufficient documentation

## 2011-06-18 DIAGNOSIS — I447 Left bundle-branch block, unspecified: Secondary | ICD-10-CM | POA: Insufficient documentation

## 2011-06-18 DIAGNOSIS — I252 Old myocardial infarction: Secondary | ICD-10-CM | POA: Insufficient documentation

## 2011-06-18 DIAGNOSIS — R110 Nausea: Secondary | ICD-10-CM | POA: Insufficient documentation

## 2011-06-18 HISTORY — DX: Essential (primary) hypertension: I10

## 2011-06-18 HISTORY — DX: Hypothyroidism, unspecified: E03.9

## 2011-06-18 LAB — CARDIAC PANEL(CRET KIN+CKTOT+MB+TROPI)
Relative Index: 2.5 (ref 0.0–2.5)
Total CK: 134 U/L (ref 7–177)
Troponin I: <0.3 ng/mL (ref <0.30)

## 2011-06-18 LAB — MAGNESIUM: Magnesium: 2.2 mg/dL (ref 1.5–2.5)

## 2011-06-18 LAB — BASIC METABOLIC PANEL
CO2: 32 mEq/L (ref 19–32)
Glucose, Bld: 126 mg/dL — ABNORMAL HIGH (ref 70–99)
Potassium: 2.4 mEq/L — CL (ref 3.5–5.1)
Sodium: 137 mEq/L (ref 135–145)

## 2011-06-18 LAB — CBC
Platelets: 260 10*3/uL (ref 150–400)
RBC: 4.9 MIL/uL (ref 3.87–5.11)
WBC: 6.8 10*3/uL (ref 4.0–10.5)

## 2011-06-18 LAB — POCT I-STAT TROPONIN I: Troponin i, poc: 0.01 ng/mL (ref 0.00–0.08)

## 2011-06-18 LAB — DIFFERENTIAL
Lymphocytes Relative: 33 % (ref 12–46)
Lymphs Abs: 2.3 10*3/uL (ref 0.7–4.0)
Neutro Abs: 3.8 10*3/uL (ref 1.7–7.7)
Neutrophils Relative %: 55 % (ref 43–77)

## 2011-06-18 LAB — POTASSIUM: Potassium: 2.5 mEq/L — CL (ref 3.5–5.1)

## 2011-06-18 MED ORDER — SODIUM CHLORIDE 0.9 % IV SOLN
Freq: Once | INTRAVENOUS | Status: AC
  Administered 2011-06-18: 14:00:00 via INTRAVENOUS

## 2011-06-18 MED ORDER — BUMETANIDE 1 MG PO TABS
1.0000 mg | ORAL_TABLET | Freq: Every day | ORAL | Status: DC
  Administered 2011-06-19: 1 mg via ORAL
  Filled 2011-06-18: qty 1

## 2011-06-18 MED ORDER — HYDROCHLOROTHIAZIDE 25 MG PO TABS
25.0000 mg | ORAL_TABLET | Freq: Every day | ORAL | Status: DC
  Administered 2011-06-19: 25 mg via ORAL
  Filled 2011-06-18: qty 1

## 2011-06-18 MED ORDER — POTASSIUM CHLORIDE CRYS ER 20 MEQ PO TBCR
40.0000 meq | EXTENDED_RELEASE_TABLET | Freq: Once | ORAL | Status: AC
  Administered 2011-06-18: 40 meq via ORAL
  Filled 2011-06-18: qty 2

## 2011-06-18 MED ORDER — ONDANSETRON HCL 4 MG/2ML IJ SOLN: 4.0000 mg | Freq: Four times a day (QID) | INTRAMUSCULAR | Status: DC | PRN

## 2011-06-18 MED ORDER — POTASSIUM CHLORIDE CRYS ER 20 MEQ PO TBCR
40.0000 meq | EXTENDED_RELEASE_TABLET | Freq: Once | ORAL | Status: AC
  Administered 2011-06-19: 40 meq via ORAL
  Filled 2011-06-18: qty 2

## 2011-06-18 MED ORDER — NITROGLYCERIN 0.4 MG SL SUBL: 0.4000 mg | SUBLINGUAL_TABLET | SUBLINGUAL | Status: DC | PRN

## 2011-06-18 MED ORDER — METOPROLOL SUCCINATE ER 50 MG PO TB24
50.0000 mg | ORAL_TABLET | Freq: Every day | ORAL | Status: DC
  Administered 2011-06-19: 50 mg via ORAL
  Filled 2011-06-18: qty 1

## 2011-06-18 MED ORDER — ROSUVASTATIN CALCIUM 40 MG PO TABS
40.0000 mg | ORAL_TABLET | Freq: Every day | ORAL | Status: DC
  Administered 2011-06-18: 40 mg via ORAL
  Filled 2011-06-18 (×2): qty 1

## 2011-06-18 MED ORDER — LOSARTAN POTASSIUM-HCTZ 100-25 MG PO TABS: 1.0000 | ORAL_TABLET | Freq: Two times a day (BID) | ORAL | Status: DC

## 2011-06-18 MED ORDER — ASPIRIN 81 MG PO CHEW
324.0000 mg | CHEWABLE_TABLET | Freq: Once | ORAL | Status: AC
  Administered 2011-06-18: 324 mg via ORAL

## 2011-06-18 MED ORDER — ASPIRIN EC 81 MG PO TBEC
81.0000 mg | DELAYED_RELEASE_TABLET | Freq: Every day | ORAL | Status: DC
  Administered 2011-06-19: 81 mg via ORAL
  Filled 2011-06-18: qty 1

## 2011-06-18 MED ORDER — LORAZEPAM 1 MG PO TABS: 1.0000 mg | ORAL_TABLET | Freq: Once | ORAL | Status: DC

## 2011-06-18 MED ORDER — LEVOTHYROXINE SODIUM 50 MCG PO TABS
50.0000 ug | ORAL_TABLET | Freq: Every day | ORAL | Status: DC
  Administered 2011-06-19: 50 ug via ORAL
  Filled 2011-06-18 (×2): qty 1

## 2011-06-18 MED ORDER — LORAZEPAM 2 MG/ML IJ SOLN
INTRAMUSCULAR | Status: AC
  Administered 2011-06-18: 1 mg
  Filled 2011-06-18: qty 1

## 2011-06-18 MED ORDER — NITROGLYCERIN 0.4 MG SL SUBL
0.4000 mg | SUBLINGUAL_TABLET | SUBLINGUAL | Status: DC | PRN
  Filled 2011-06-18: qty 25

## 2011-06-18 MED ORDER — ESTRADIOL 1 MG PO TABS
1.0000 mg | ORAL_TABLET | Freq: Every day | ORAL | Status: DC
  Administered 2011-06-19: 1 mg via ORAL
  Filled 2011-06-18: qty 1

## 2011-06-18 MED ORDER — ACETAMINOPHEN 325 MG PO TABS: 650.0000 mg | ORAL_TABLET | ORAL | Status: DC | PRN

## 2011-06-18 MED ORDER — FAMOTIDINE IN NACL 20-0.9 MG/50ML-% IV SOLN
20.0000 mg | Freq: Once | INTRAVENOUS | Status: AC
  Administered 2011-06-18: 20 mg via INTRAVENOUS
  Filled 2011-06-18: qty 50

## 2011-06-18 MED ORDER — ASPIRIN 81 MG PO CHEW
CHEWABLE_TABLET | ORAL | Status: AC
  Filled 2011-06-18: qty 4

## 2011-06-18 MED ORDER — LOSARTAN POTASSIUM 50 MG PO TABS
100.0000 mg | ORAL_TABLET | Freq: Every day | ORAL | Status: DC
  Administered 2011-06-19: 100 mg via ORAL
  Filled 2011-06-18: qty 2

## 2011-06-18 NOTE — H&P (Signed)
Laura [name] is an 65 y.o. female.   Chief Complaint:  Chest pain HPI:  Patient is a 65 year old Caucasian female whose mother recently passed away approximately 2 weeks ago. Patient has a history of Takosubo syndrome, nonischemic cardiomyopathy with an ejection fraction recently checked by echo of 38%. She had a left heart catheterization in June 2001 which showed normal coronary arteries. She presents today with chest pain she describes as heavy and stabbing feeling substernally. The pain was 9/10 in intensity. There is no associated pain radiation to back neck or left arm. She does report that recently having some cough and just had associated nausea and a little shortness of breath. She denies diaphoresis palpitations vomiting or acid reflux headache and lower extremity edema. She did take 2 TUMS during the course. She does have nitroglycerin sublingual at home but forgot that she recently got. Upon presenting to the ER her chest pain resolved just prior to the nurse bringing in some sublingual nitroglycerin. Initial point-of-care troponins negative. EKG shows a chronic left bundle branch block.  Past Medical History  Diagnosis Date  . Myocardial infarct   . Enlarged heart   . Hypertension   . Thyroid disease     History reviewed. No pertinent past surgical history.  No family history on file. Social History:  reports that she has never smoked. She does not have any smokeless tobacco history on file. She reports that she does not drink alcohol or use illicit drugs.  Allergies:  Allergies  Allergen Reactions  . Codeine Hives    Medications Bumetanide 1 mg daily, Nuvigil 250 mg daily, atorvastatin 40 mg daily, levothyroxine 50 mcg daily, metoprolol succinate 50 mg daily, Abilify 5 mg daily, Cymbalta 60 mg daily, losartan/hydrochlorothiazide 100/25 mg twice daily trazodone 150 mg when necessary, estradiol 1 mg daily, calcium 1000 mg daily vitamin D 3 2000 mg daily, Valium when necessary,  hydrocodone when necessary, laxatives when necessary, potassium over-the-counter when necessary   Medications Prior to Admission  Medication Dose Route Frequency Provider Last Rate Last Dose  . 0.9 %  sodium chloride infusion   Intravenous Once Mihai Croitoru 20 mL/hr at 06/18/11 1359    . aspirin chewable tablet 324 mg  324 mg Oral Once Jethro Bastos, NP   324 mg at 06/18/11 1512  . famotidine (PEPCID) IVPB 20 mg  20 mg Intravenous Once Jethro Bastos, NP   20 mg at 06/18/11 1359  . LORazepam (ATIVAN) 2 MG/ML injection        1 mg at 06/18/11 1400  . nitroGLYCERIN (NITROSTAT) SL tablet 0.4 mg  0.4 mg Sublingual Q5 min PRN Jethro Bastos, NP      . potassium chloride SA (K-DUR,KLOR-CON) CR tablet 40 mEq  40 mEq Oral Once Jethro Bastos, NP   40 mEq at 06/18/11 1514  . DISCONTD: LORazepam (ATIVAN) tablet 1 mg  1 mg Oral Once Jethro Bastos, NP       No current outpatient prescriptions on file as of 06/18/2011.    Results for orders placed during the hospital encounter of 06/18/11 (from the past 48 hour(s))  CBC     Status: Abnormal   Collection Time   06/18/11  2:00 PM      Component Value Range Comment   WBC 6.8  4.0 - 10.5 (K/uL)    RBC 4.90  3.87 - 5.11 (MIL/uL)    Hemoglobin 12.7  12.0 - 15.0 (g/dL)    HCT 45.4  09.8 -  46.0 (%)    MCV 79.4  78.0 - 100.0 (fL)    MCH 25.9 (*) 26.0 - 34.0 (pg)    MCHC 32.6  30.0 - 36.0 (g/dL)    RDW 16.1  09.6 - 04.5 (%)    Platelets 260  150 - 400 (K/uL)   DIFFERENTIAL     Status: Normal   Collection Time   06/18/11  2:00 PM      Component Value Range Comment   Neutrophils Relative 55  43 - 77 (%)    Neutro Abs 3.8  1.7 - 7.7 (K/uL)    Lymphocytes Relative 33  12 - 46 (%)    Lymphs Abs 2.3  0.7 - 4.0 (K/uL)    Monocytes Relative 9  3 - 12 (%)    Monocytes Absolute 0.6  0.1 - 1.0 (K/uL)    Eosinophils Relative 3  0 - 5 (%)    Eosinophils Absolute 0.2  0.0 - 0.7 (K/uL)    Basophils Relative 1  0 - 1 (%)    Basophils Absolute 0.1  0.0 -  0.1 (K/uL)   BASIC METABOLIC PANEL     Status: Abnormal   Collection Time   06/18/11  2:00 PM      Component Value Range Comment   Sodium 137  135 - 145 (mEq/L)    Potassium 2.4 (*) 3.5 - 5.1 (mEq/L)    Chloride 94 (*) 96 - 112 (mEq/L)    CO2 32  19 - 32 (mEq/L)    Glucose, Bld 126 (*) 70 - 99 (mg/dL)    BUN 14  6 - 23 (mg/dL)    Creatinine, Ser 4.09  0.50 - 1.10 (mg/dL)    Calcium 9.5  8.4 - 10.5 (mg/dL)    GFR calc non Af Amer >90  >90 (mL/min)    GFR calc Af Amer >90  >90 (mL/min)   POCT I-STAT TROPONIN I     Status: Normal   Collection Time   06/18/11  2:12 PM      Component Value Range Comment   Troponin i, poc 0.01  0.00 - 0.08 (ng/mL)    Comment 3             Dg Chest Port 1 View  06/18/2011  *RADIOLOGY REPORT*  Clinical Data: 65 year old female with chest pain.  PORTABLE CHEST - 1 VIEW  Comparison: 12/11/2009 and earlier.  Findings: Portable AP semi upright view 1404 hours.  Stable lung volumes.  Stable mild cardiomegaly. Other mediastinal contours are within normal limits.  Visualized tracheal air column is within normal limits.  No pneumothorax, pulmonary edema, pleural effusion or confluent pulmonary opacity.  IMPRESSION: No acute cardiopulmonary abnormality.  Original Report Authenticated By: Harley Hallmark, M.D.    Review of Systems  Constitutional: Negative for diaphoresis.  HENT: Negative for neck pain.   Respiratory: Positive for cough and shortness of breath. Negative for sputum production.        A little SOB  Cardiovascular: Positive for chest pain. Negative for palpitations, orthopnea, leg swelling and PND.  Gastrointestinal: Positive for nausea. Negative for vomiting, abdominal pain, diarrhea, constipation, blood in stool and melena.  Genitourinary: Negative for dysuria and hematuria.  Musculoskeletal: Negative for back pain.  Neurological: Negative for dizziness and weakness.  Psychiatric/Behavioral: Negative.   All other systems reviewed and are  negative.    Blood pressure 94/60, pulse 71, temperature 98.4 F (36.9 C), temperature source Oral, resp. rate 16, height 5\' 4"  (1.626 m),  weight 83.462 kg (184 lb), SpO2 100.00%. Physical Exam  Constitutional: She is oriented to person, place, and time. She appears well-nourished. No distress.  HENT:  Head: Normocephalic and atraumatic.  Eyes: EOM are normal. Pupils are equal, round, and reactive to light. No scleral icterus.  Neck: Neck supple. No JVD present.  Cardiovascular: Normal rate and regular rhythm.   No murmur heard. Respiratory: Effort normal and breath sounds normal. She has no wheezes. She has no rales.  GI: Soft. Bowel sounds are normal.  Musculoskeletal: She exhibits no edema.  Lymphadenopathy:    She has no cervical adenopathy.  Neurological: She is alert and oriented to person, place, and time.  Skin: Skin is warm and dry.  Psychiatric: She has a normal mood and affect. Her behavior is normal.     Assessment/Plan Patient Active Hospital Problem List: Chest pain (06/18/2011) Hypokalemia (06/18/2011) Takotsubo cardiomyopathy, history of (06/18/2011) CHF (congestive heart failure), NYHA class I:  (06/18/2011)  Plan:Patient be admitted to telemetry unit to rule out acute coronary syndrome. We'll cycle her cardiac enzymes every 6 hours x3. Will hold off on any nitrates given her chest pain has resolved. Continue home medications as appropriate.   HAGER,BRYAN W 06/18/2011, 5:39 PM   I was present during his interview, personally examined the patient, and reviewed the chart along with Mr. Leron Croak, [place]. I agree with his findings and assessment.   Mrs. Charnley has a history of likely non-ischemic CM - but did have evidence of myocardial injury with normal coronary angiography in June of 2011.  For this reason, I feel it necessary to monitor her over night while checking cardiac biomarkers.  If normal, she can be discharged safely with outpatient evaluation of likely  non-anginal chest pain.  Marland KitchenMarykay Lex, M.D., M.S. [facility] [address] [place], [place]  [address]  [phone]  12/20.2012 6:20 PM

## 2011-06-18 NOTE — ED Notes (Signed)
Attempted to call report, unable.

## 2011-06-18 NOTE — Progress Notes (Signed)
CRITICAL VALUE ALERT  Critical value received:  Potassium 2.5  Date of notification:  06/18/11  Time of notification:  2300  Critical value read back:yes  Nurse who received alert:  France Ravens  MD notified (1st page):  Wilburt Finlay  Time of first page:  2310  MD notified (2nd page):  Time of second page:  Responding MD:  Wilburt Finlay  Time MD responded:  [phone]

## 2011-06-18 NOTE — ED Notes (Signed)
MD at bedside. Dr. Herbie Baltimore with [facility] at bedside.

## 2011-06-18 NOTE — ED Provider Notes (Addendum)
History     CSN: [id number]  Arrival date & time 06/18/11  1313   First MD Initiated Contact with Patient 06/18/11 1338      Chief Complaint  Patient presents with  . Chest Pain    (Consider location/radiation/quality/duration/timing/severity/associated sxs/prior treatment) Patient is a 65 y.o. female presenting with chest pain. The history is provided by the patient. No language interpreter was used.  Chest Pain The chest pain began 1 - 2 hours ago. Duration of episode(s) is 40 minutes. Chest pain occurs intermittently. The chest pain is resolved. At its most intense, the pain is at 9/10. The pain is currently at 1/10. The severity of the pain is moderate. The quality of the pain is described as pressure-like and sharp. The pain does not radiate. Chest pain is worsened by certain positions. Primary symptoms include shortness of breath, cough, abdominal pain and nausea. Pertinent negatives for primary symptoms include no fever, no fatigue, no syncope, no wheezing, no palpitations, no vomiting, no dizziness and no altered mental status.  Associated symptoms include orthopnea and paroxysmal nocturnal dyspnea.  Pertinent negatives for associated symptoms include no diaphoresis, no lower extremity edema, no near-syncope, no numbness and no weakness. Risk factors include obesity, lack of exercise, hormone replacement therapy, being elderly, post-menopausal, sedentary lifestyle and stress.  Her past medical history is significant for anxiety/panic attacks, CHF, hyperlipidemia, hypertension, sleep apnea and thyroid problem.  Pertinent negatives for past medical history include no aneurysm, no diabetes, no DVT, no seizures, no strokes, no TIA and no valve disorder.  Her family medical history is significant for early MI in family, PE in family and sudden death in family.  Pertinent negatives for family medical history include: no PVD in family, no sickle cell disease in family, no stroke in family  and no TIA in family.  Procedure history is positive for cardiac catheterization, echocardiogram and stress echo.  Procedure history is negative for exercise treadmill test.   States that she was sitting at the kitchen table bent over the table when she started having midsternal chest pain that lasted about .  States that she just did not feel well this am and she was nauseated. States that this pain was worse than the pain she had when she had her MI in 6/11.  Recent stressors in her life including the death of her mother 2 weeks ago.  Past Medical History  Diagnosis Date  . Myocardial infarct   . Enlarged heart   . Hypertension   . Thyroid disease     No past surgical history on file.  No family history on file.  History  Substance Use Topics  . Smoking status: Never Smoker   . Smokeless tobacco: Not on file  . Alcohol Use: No    OB History    Grav Para Term Preterm Abortions TAB SAB Ect Mult Living                  Review of Systems  Constitutional: Negative for fever, diaphoresis and fatigue.  Respiratory: Positive for cough and shortness of breath. Negative for wheezing.   Cardiovascular: Positive for chest pain and orthopnea. Negative for palpitations, syncope and near-syncope.  Gastrointestinal: Positive for nausea and abdominal pain. Negative for vomiting.  Musculoskeletal: Negative for back pain and gait problem.  Neurological: Negative for dizziness, seizures, weakness and numbness.  Psychiatric/Behavioral: Positive for agitation. Negative for behavioral problems, confusion and altered mental status.  All other systems reviewed and are negative.  Allergies  Codeine  Home Medications   Current Outpatient Rx  Name Route Sig Dispense Refill  . ATORVASTATIN CALCIUM 80 MG PO TABS Oral Take 80 mg by mouth daily.      . BUMETANIDE 1 MG PO TABS Oral Take 1 mg by mouth daily.      Marland Kitchen ESTRADIOL 1 MG PO TABS Oral Take 1 mg by mouth daily.      Marland Kitchen  LEVOTHYROXINE SODIUM 50 MCG PO TABS Oral Take 50 mcg by mouth daily.      Marland Kitchen LOSARTAN POTASSIUM-HCTZ 100-25 MG PO TABS Oral Take 1 tablet by mouth 2 (two) times daily.      Marland Kitchen METOPROLOL SUCCINATE ER 50 MG PO TB24 Oral Take 50 mg by mouth daily.        BP 117/69  Pulse 86  Temp(Src) 98.4 F (36.9 C) (Oral)  Resp 13  Ht 5\' 4"  (1.626 m)  Wt 184 lb (83.462 kg)  BMI 31.58 kg/m2  SpO2 99%  Physical Exam  Nursing note and vitals reviewed. Constitutional: She appears well-developed and well-nourished. She appears distressed.  Cardiovascular: Normal pulses.  Exam reveals no gallop, no distant heart sounds and no friction rub.   No murmur heard. Pulses:      Carotid pulses are 2+ on the right side, and 2+ on the left side.      Radial pulses are 2+ on the right side, and 2+ on the left side.       Dorsalis pedis pulses are 2+ on the right side, and 2+ on the left side.       Posterior tibial pulses are 2+ on the right side.    ED Course  Procedures (including critical care time)   Labs Reviewed  CBC  DIFFERENTIAL  BASIC METABOLIC PANEL  I-STAT TROPONIN I   Dg Chest Port 1 View  06/18/2011  *RADIOLOGY REPORT*  Clinical Data: 65 year old female with chest pain.  PORTABLE CHEST - 1 VIEW  Comparison: 12/11/2009 and earlier.  Findings: Portable AP semi upright view 1404 hours.  Stable lung volumes.  Stable mild cardiomegaly. Other mediastinal contours are within normal limits.  Visualized tracheal air column is within normal limits.  No pneumothorax, pulmonary edema, pleural effusion or confluent pulmonary opacity.  IMPRESSION: No acute cardiopulmonary abnormality.  Original Report Authenticated By: Harley Hallmark, M.D.     No diagnosis found.    MDM   Date: 06/18/2011  Rate: 86  Rhythm: normal sinus rhythm  QRS Axis: normal  Intervals: normal  ST/T Wave abnormalities: normal  Conduction Disutrbances:right bundle branch block  Narrative Interpretation:   Old EKG Reviewed:  unchanged  1600:   Patient here today for chest pain midsternal like the pain she had when she had her MI a year and a half ago. Troponins negative and EKG had no changes. Chest x-ray no acute process. Potassium was 2.4 she received of KCL in the ER  for that. All other labs unremarkable abnormals as follows. Pain free presently.  No nitro given in the ER.  Sydell Axon. for [facility] cardiology. Patient will be evaluated and possibly admitted today. Ativan was given in the ER today for crying and anxiety.    Labs Reviewed  CBC - Abnormal; Notable for the following:    MCH 25.9 (*)    All other components within normal limits  BASIC METABOLIC PANEL - Abnormal; Notable for the following:    Potassium 2.4 (*)    Chloride 94 (*)  Glucose, Bld 126 (*)    All other components within normal limits  DIFFERENTIAL  POCT I-STAT TROPONIN I  I-STAT TROPONIN I           Jethro Bastos, NP 06/18/11 1601  Jethro Bastos, NP 06/18/11 1610

## 2011-06-18 NOTE — ED Notes (Signed)
2027-01 READY

## 2011-06-18 NOTE — ED Notes (Signed)
Diet tray ordered 

## 2011-06-18 NOTE — ED Notes (Signed)
2010-01 READY

## 2011-06-18 NOTE — ED Notes (Signed)
Pt reports recent stressors, buried her mother last week.  Pt reports productive cough with clear phlegm.

## 2011-06-18 NOTE — ED Provider Notes (Signed)
Medical screening examination/treatment/procedure(s) were conducted as a shared visit with non-physician practitioner(s) and myself.  I personally evaluated the patient during the encounter.  Chest pain with good history. Will consult cardiologist  Donnetta Hutching, MD 06/18/11 [phone]

## 2011-06-18 NOTE — ED Notes (Signed)
Pt presents with onset of epigastric pain that has been constant x 1 hour.  Pt reports pain has lessened.  Pt reports shortness of breath and nausea.

## 2011-06-18 NOTE — ED Notes (Signed)
Patient denies pain and is resting comfortably. Remains NSR on monitor, Respirations even & unlabored, no distress.

## 2011-06-18 NOTE — ED Notes (Signed)
C/o SSCP radiating thru to back onset 1100. "It feels like a burning pain".  +SOB, nausea, chills. Denies diaphoresis, vomiting.  Also c/o cough onset today. Denies SOB presently. States CP "easing off now".

## 2011-06-18 NOTE — ED Notes (Signed)
Patient denies chest pain and is resting comfortably.

## 2011-06-19 ENCOUNTER — Ambulatory Visit (HOSPITAL_COMMUNITY): Payer: Medicaid Other

## 2011-06-19 ENCOUNTER — Other Ambulatory Visit: Payer: Self-pay

## 2011-06-19 LAB — BASIC METABOLIC PANEL
BUN: 14 mg/dL (ref 6–23)
Creatinine, Ser: 0.87 mg/dL (ref 0.50–1.10)
GFR calc Af Amer: 79 mL/min — ABNORMAL LOW (ref >90)
GFR calc non Af Amer: 68 mL/min — ABNORMAL LOW (ref >90)
Glucose, Bld: 111 mg/dL — ABNORMAL HIGH (ref 70–99)
Potassium: 3.7 mEq/L (ref 3.5–5.1)

## 2011-06-19 LAB — LIPID PANEL
HDL: 50 mg/dL (ref >39)
LDL Cholesterol: 71 mg/dL (ref 0–99)
VLDL: 33 mg/dL (ref 0–40)

## 2011-06-19 LAB — CARDIAC PANEL(CRET KIN+CKTOT+MB+TROPI)
CK, MB: 2.9 ng/mL (ref 0.3–4.0)
Relative Index: 2.6 — ABNORMAL HIGH (ref 0.0–2.5)
Relative Index: 2.2 (ref 0.0–2.5)
Troponin I: <0.3 ng/mL (ref <0.30)

## 2011-06-19 LAB — HEMOGLOBIN A1C: Hgb A1c MFr Bld: 6.6 % — ABNORMAL HIGH (ref <5.7)

## 2011-06-19 MED ORDER — POTASSIUM CHLORIDE ER 10 MEQ PO TBCR: 10.0000 meq | EXTENDED_RELEASE_TABLET | Freq: Every day | ORAL | Status: DC

## 2011-06-19 MED ORDER — ACETAMINOPHEN 325 MG PO TABS: 650.0000 mg | ORAL_TABLET | ORAL | Status: AC | PRN

## 2011-06-19 MED ORDER — ASPIRIN 81 MG PO TBEC: 81.0000 mg | DELAYED_RELEASE_TABLET | Freq: Every day | ORAL | Status: DC

## 2011-06-19 NOTE — Progress Notes (Signed)
Subjective: No chest pain, no SOB.  Objective: Vital signs in last 24 hours: Temp:  [98 F (36.7 C)-98.6 F (37 C)] 98 F (36.7 C) (12/21 0404) Pulse Rate:  [51-88] 70  (12/21 0404) Resp:  [13-22] 16  (12/21 0404) BP: (94-132)/(60-85) 130/77 mmHg (12/21 0404) SpO2:  [97 %-100 %] 99 % (12/21 0404) Weight:  [83.462 kg (184 lb)-86.9 kg (191 lb 9.3 oz)] 191 lb 9.3 oz (86.9 kg) (12/20 2131) Weight change:  Last BM Date: 06/18/11 Intake/Output from previous day: not noted   Intake/Output this shift:    PE: General:A&O X3, pleasant affect. Heart:S1S2, rrr. No obvious murmur. Lungs:clear no wheezes. Abd:+BS, soft non-tender. Ext:no edema.    Lab Results:  Basename 06/18/11 1400  WBC 6.8  HGB 12.7  HCT 38.9  PLT 260   BMET  Basename 06/19/11 0355 06/18/11 2202 06/18/11 1400  NA 139 -- 137  K 3.7 2.5* --  CL 100 -- 94*  CO2 34* -- 32  GLUCOSE 111* -- 126*  BUN 14 -- 14  CREATININE 0.87 -- 0.59  CALCIUM 9.1 -- 9.5    Basename 06/19/11 0355 06/18/11 2202  TROPONINI <0.30 <0.30    Lab Results  Component Value Date   CHOL 154 06/19/2011   HDL 50 06/19/2011   LDLCALC 71 06/19/2011   TRIG 164* 06/19/2011   CHOLHDL 3.1 06/19/2011   Lab Results  Component Value Date   HGBA1C  Value: 6.0 (NOTE)                                                                       According to the ADA Clinical Practice Recommendations for 2011, when HbA1c is used as a screening test:   >=6.5%   Diagnostic of Diabetes Mellitus           (if abnormal result  is confirmed)  5.7-6.4%   Increased risk of developing Diabetes Mellitus  References:Diagnosis and Classification of Diabetes Mellitus,Diabetes Care,2011,34(Suppl 1):S62-S69 and Standards of Medical Care in         Diabetes - 2011,Diabetes Care,2011,34  (Suppl 1):S11-S61.* 12/08/2009       Hepatic Function Panel No results found for this basename: PROT,ALBUMIN,AST,ALT,ALKPHOS,BILITOT,BILIDIR,IBILI in the last 72 hours  Basename  06/19/11 0355  CHOL 154   No results found for this basename: PROTIME in the last 72 hours    EKG: Orders placed during the hospital encounter of 06/18/11  . EKG 12-LEAD  . EKG 12-LEAD    Studies/Results: Dg Chest Port 1 View  06/18/2011  *RADIOLOGY REPORT*  Clinical Data: 65 year old female with chest pain.  PORTABLE CHEST - 1 VIEW  Comparison: 12/11/2009 and earlier.  Findings: Portable AP semi upright view 1404 hours.  Stable lung volumes.  Stable mild cardiomegaly. Other mediastinal contours are within normal limits.  Visualized tracheal air column is within normal limits.  No pneumothorax, pulmonary edema, pleural effusion or confluent pulmonary opacity.  IMPRESSION: No acute cardiopulmonary abnormality.  Original Report Authenticated By: Harley Hallmark, M.D.    Medications: I have reviewed the patient's current medications.  Assessment/Plan: Patient Active Problem List  Diagnoses  . Chest pain  . Hypokalemia  . Takotsubo cardiomyopathy, history of  . CHF (congestive heart failure), NYHA class I:  PLAN: Negative MI, VS stable, Hypokalemia resolved. Was not on K+ as outpt. Is on Bumex and losartan-hctz  No acute chf this admit.  Pain came on suddenly and felt like someone punched her in the chest, then a twisting stabbing pain.  None now. Ambulate in hall, d/c o2.  ? D/c home?    LOS: 1 day   Ciaran Begay R 06/19/2011, 8:54 AM

## 2011-06-19 NOTE — Discharge Summary (Signed)
Physician Discharge Summary  Patient ID: Laura [name] MRN: [id number] DOB/AGE: [birth year] 65 y.o.  Admit date: 06/18/2011 Discharge date: 06/19/2011  Discharge Diagnoses:  Principal Problem:  *Chest pain neg. MI, hx of patent coronary arteries.  Active Problems:  Hypokalemia resolved  Takotsubo cardiomyopathy, history of  CHF (congestive heart failure), NYHA class I:    Discharged Condition: good  Hospital Course: 65 year old white female was admitted 06/18/2011 with chest pain which she described as heavy and stabbing feeling substernally. Initially she said it felt like someone had punched her in the chest and that it then developed into more of a stabbing pain. She's had pain like this before but it is very brief.  With this pain he continued it would not resolve on its own.she rated it a 9 on 1-10 scale and intensity but there was no radiation though she did have mild nausea and mild shortness of breath. She did take 2 tums with her pain which did not relieve the symptoms.  The pain resolved in the emergency room prior to any treatment. EKG revealed a chronic left bundle branch block.  The patient does have a history of Takasubo syndrome with nonischemic cardiomyopathy and an ejection fraction of 38%. Her last heart catheterization was June of 2001 revealing normal coronary arteries.  She also has been under stress recently her mother died 2 weeks ago.  Cardiac enzymes have been negative during this hospitalization.  She's had no further complaints. Her potassium on admission was very low at 2.4 this has been replaced. We have also added potassium to her daily medical regimen.  She was seen by Dr. Rennis Golden today day of discharge and is stable and ready for discharge home. Will have a basic metabolic panel done in a week.  She will followup with her primary cardiologist Dr. Royann Shivers.  She will ambulate in the hall prior to discharge as well.       Consults: none  Significant  Diagnostic Studies: laboratory data admission sodium 137 potassium 2.5 chloride 94 CO2 32 BUN 14 creatinine 0.9 calcium 9.5 and magnesium 2.2  At discharge sodium 139 potassium 3.7 chloride 100 CO2 34 BUN 14 creatinine 0.87 calcium 9.1.   Cardiac enzymes CK ranged 113-134 M.D. 2.9-3.3 a troponin less than 0.30x3.  Triglycerides were 164 cholesterol 154 HDL 50 LDL 71. Hemoglobin 12.7 hematocrit 38.9 WBC 6.8 platelets 260 TSH 1.8 on 4 Portable  chest x-ray: without acute abnormality.  EKG is sinus rhythm with left bundle branch block no acute changes.    Discharge Exam: Blood pressure 130/77, pulse 70, temperature 98 F (36.7 C), temperature source Oral, resp. rate 16, height 5\' 4"  (1.626 m), weight 86.9 kg (191 lb 9.3 oz), SpO2 99.00%.  PE: General:A&O X3, pleasant affect.  Heart:S1S2, rrr. No obvious murmur.  Lungs:clear no wheezes.  Abd:+BS, soft non-tender.  Ext:no edema.    Disposition: To Home   Current Discharge Medication List    START taking these medications   Details  acetaminophen (TYLENOL) 325 MG tablet Take 2 tablets (650 mg total) by mouth every 4 (four) hours as needed. Qty: 30 tablet    aspirin EC 81 MG EC tablet Take 1 tablet (81 mg total) by mouth daily.    potassium chloride (K-DUR) 10 MEQ tablet Take 1 tablet (10 mEq total) by mouth daily. Qty: 30 tablet, Refills: 11      CONTINUE these medications which have NOT CHANGED   Details  atorvastatin (LIPITOR) 80 MG tablet Take 80 mg by  mouth daily.      bumetanide (BUMEX) 1 MG tablet Take 1 mg by mouth daily.      estradiol (ESTRACE) 1 MG tablet Take 1 mg by mouth daily.      levothyroxine (SYNTHROID, LEVOTHROID) 50 MCG tablet Take 50 mcg by mouth daily.      losartan-hydrochlorothiazide (HYZAAR) 100-25 MG per tablet Take 1 tablet by mouth 2 (two) times daily.      metoprolol (TOPROL-XL) 50 MG 24 hr tablet Take 50 mg by mouth daily.         Follow-up Information    Follow up with  CROITORU,MIHAI. (The office will call with the date and time.  If you have not heard from our office by 06/25/11 then please call the office. )    Contact information:   [address] [place] [address] [phone]          Signed: Leone Brand 06/19/2011, 12:17 PM

## 2011-06-19 NOTE — Progress Notes (Signed)
Pt/family given discharge instructions, follow up appts, and medication list.  Pt/family verbalizes understanding.Laura [name]

## 2011-06-19 NOTE — Discharge Summary (Signed)
Kenneth C. Hilty, MD Attending Cardiologist [facility]  

## 2011-06-19 NOTE — Progress Notes (Signed)
Pt. Seen and examined. Agree with the NP/PA-C note as written.  Pain has totally resolved. Was sharp, stabbing, in the lower subxiphoid area. She reports it "hits her all of a sudden" then goes away.  EKG shows no changes. Cardiac enzymes are negative. I feel she could be safely discharged. Follow-up in our office with Dr. Salena Saner after the [facility].  Chrystie Nose, MD Attending Cardiologist [facility]

## 2011-06-24 NOTE — ED Provider Notes (Signed)
Medical screening examination/treatment/procedure(s) were conducted as a shared visit with non-physician practitioner(s) and myself.  I personally evaluated the patient during the encounter.  Patient has history of MI.  Chest pain history of similar. Consult cardiology  Donnetta Hutching, MD 06/24/11 [phone]

## 2011-07-08 DIAGNOSIS — I509 Heart failure, unspecified: Secondary | ICD-10-CM | POA: Diagnosis not present

## 2011-07-08 DIAGNOSIS — I10 Essential (primary) hypertension: Secondary | ICD-10-CM | POA: Diagnosis not present

## 2011-07-16 DIAGNOSIS — IMO0002 Reserved for concepts with insufficient information to code with codable children: Secondary | ICD-10-CM | POA: Diagnosis not present

## 2011-07-20 DIAGNOSIS — E782 Mixed hyperlipidemia: Secondary | ICD-10-CM | POA: Diagnosis not present

## 2011-07-20 DIAGNOSIS — R7309 Other abnormal glucose: Secondary | ICD-10-CM | POA: Diagnosis not present

## 2011-07-20 DIAGNOSIS — I10 Essential (primary) hypertension: Secondary | ICD-10-CM | POA: Diagnosis not present

## 2011-07-20 DIAGNOSIS — E559 Vitamin D deficiency, unspecified: Secondary | ICD-10-CM | POA: Diagnosis not present

## 2011-07-20 DIAGNOSIS — Z79899 Other long term (current) drug therapy: Secondary | ICD-10-CM | POA: Diagnosis not present

## 2011-07-21 ENCOUNTER — Encounter: Payer: Self-pay | Admitting: Physician Assistant

## 2011-07-21 ENCOUNTER — Other Ambulatory Visit: Payer: Self-pay | Admitting: Physician Assistant

## 2011-07-21 ENCOUNTER — Encounter (HOSPITAL_BASED_OUTPATIENT_CLINIC_OR_DEPARTMENT_OTHER): Payer: Self-pay | Admitting: *Deleted

## 2011-07-21 DIAGNOSIS — S83249A Other tear of medial meniscus, current injury, unspecified knee, initial encounter: Secondary | ICD-10-CM

## 2011-07-21 NOTE — Pre-Procedure Instructions (Signed)
To come for BMET 

## 2011-07-21 NOTE — Pre-Procedure Instructions (Addendum)
History discussed with Dr. Chaney Malling, pt. OK to come for surg.  Pt's 1-view CXR from 06/18/2011 is sufficient.

## 2011-07-21 NOTE — H&P (Signed)
Laura [name] is an 66 y.o. female.   Chief Complaint: right knee pain HPI: Laura [name] is a 66 year old seen for evaluation for significant increasing right knee pain. She saw Dr. Kramer on 06/11/11 with medial meniscus tear, her symptoms had improved but over the past 2 weeks pain has gotten significantly worse. She had an MRI on 06/09/11 that showed a medial meniscus tear.  She cannot walk without significant medial joint line pain.   Past Medical History  Diagnosis Date  . Myocardial infarct   . Enlarged heart   . Hypertension   . Thyroid disease   . Hypothyroidism     Past Surgical History  Procedure Date  . Abdominal hysterectomy   . Nasal septum surgery 1980's    Family History  Problem Relation Age of Onset  . Pneumonia Mother   . Hypertension Mother   . Heart attack Father   . Fibromyalgia Brother   . Pulmonary embolism Brother   . Hypertension Brother    Social History:  reports that she has never smoked. She does not have any smokeless tobacco history on file. She reports that she does not drink alcohol or use illicit drugs.  Allergies:  Allergies  Allergen Reactions  . Codeine Hives   Current Outpatient Prescriptions on File Prior to Visit  Medication Sig Dispense Refill  . atorvastatin (LIPITOR) 80 MG tablet Take 80 mg by mouth daily.        . bumetanide (BUMEX) 1 MG tablet Take 1 mg by mouth daily.        . estradiol (ESTRACE) 1 MG tablet Take 1 mg by mouth daily.        . levothyroxine (SYNTHROID, LEVOTHROID) 50 MCG tablet Take 50 mcg by mouth daily.        . losartan-hydrochlorothiazide (HYZAAR) 100-25 MG per tablet Take 1 tablet by mouth 2 (two) times daily.        . metoprolol (TOPROL-XL) 50 MG 24 hr tablet Take 50 mg by mouth daily.        . potassium chloride (K-DUR) 10 MEQ tablet Take 1 tablet (10 mEq total) by mouth daily.  30 tablet  11  . aspirin EC 81 MG EC tablet Take 1 tablet (81 mg total) by mouth daily.        No results found for this or  any previous visit (from the past 48 hour(s)). No results found.  Review of Systems  Constitutional: Negative.   HENT: Negative.   Eyes: Negative.   Respiratory: Negative.   Cardiovascular: Negative.   Gastrointestinal: Negative.   Genitourinary: Negative.   Musculoskeletal:       Right knee medial pain  Skin: Negative.   Neurological: Negative.   Endo/Heme/Allergies: Negative.   Psychiatric/Behavioral: Negative.     There were no vitals taken for this visit. Physical Exam  Constitutional: She is oriented to person, place, and time. She appears well-developed and well-nourished.  HENT:  Head: Normocephalic and atraumatic.  Mouth/Throat: Oropharynx is clear and moist.  Eyes: EOM are normal. Pupils are equal, round, and reactive to light.  Neck: Neck supple.  Cardiovascular: Normal rate.   Respiratory: Effort normal.  GI: Soft.  Genitourinary:       Not pertinent to current symptomatology therefore not examined.  Musculoskeletal:       Examination of her right knee reveals pain over the medial joint line positive medial McMurray's 1+ effusion, range of motion 0-100 degrees knee is stable. Exam of the   left knee reveals full range of motion without pain swelling weakness or instability  Neurological: She is alert and oriented to person, place, and time.  Skin: Skin is warm and dry.  Psychiatric: She has a normal mood and affect. Her behavior is normal. Thought content normal.     Assessment Patient Active Problem List  Diagnoses  . Chest pain neg. MI, hx of patent coronary arteries.   . Hypokalemia resolved  . Takotsubo cardiomyopathy, history of  . CHF (congestive heart failure), NYHA class I:   . Medial meniscus tear right knee     Plan Right knee arthroscopy with medial menisectomy.  The risks, benefits, and possible complications of the procedure were discussed in detail with the patient.  The patient is without question.  Karys Meckley J 07/21/2011, 10:29  AM    

## 2011-07-22 ENCOUNTER — Encounter (HOSPITAL_BASED_OUTPATIENT_CLINIC_OR_DEPARTMENT_OTHER)
Admission: RE | Admit: 2011-07-22 | Discharge: 2011-07-22 | Disposition: A | Discharge status: Home / Self Care | Payer: Medicare Other | Source: Ambulatory Visit | Attending: Orthopedic Surgery | Admitting: Orthopedic Surgery

## 2011-07-22 DIAGNOSIS — G4730 Sleep apnea, unspecified: Secondary | ICD-10-CM | POA: Diagnosis not present

## 2011-07-22 DIAGNOSIS — Z79899 Other long term (current) drug therapy: Secondary | ICD-10-CM | POA: Diagnosis not present

## 2011-07-22 DIAGNOSIS — F329 Major depressive disorder, single episode, unspecified: Secondary | ICD-10-CM | POA: Diagnosis not present

## 2011-07-22 DIAGNOSIS — M23359 Other meniscus derangements, posterior horn of lateral meniscus, unspecified knee: Secondary | ICD-10-CM | POA: Diagnosis not present

## 2011-07-22 DIAGNOSIS — E039 Hypothyroidism, unspecified: Secondary | ICD-10-CM | POA: Diagnosis not present

## 2011-07-22 DIAGNOSIS — M2240 Chondromalacia patellae, unspecified knee: Secondary | ICD-10-CM | POA: Diagnosis not present

## 2011-07-22 DIAGNOSIS — I252 Old myocardial infarction: Secondary | ICD-10-CM | POA: Diagnosis not present

## 2011-07-22 DIAGNOSIS — I10 Essential (primary) hypertension: Secondary | ICD-10-CM | POA: Diagnosis not present

## 2011-07-22 DIAGNOSIS — M23329 Other meniscus derangements, posterior horn of medial meniscus, unspecified knee: Secondary | ICD-10-CM | POA: Diagnosis not present

## 2011-07-22 LAB — BASIC METABOLIC PANEL
BUN: 15 mg/dL (ref 6–23)
Calcium: 9.8 mg/dL (ref 8.4–10.5)
GFR calc Af Amer: >90 mL/min (ref >90)
GFR calc non Af Amer: 88 mL/min — ABNORMAL LOW (ref >90)
Glucose, Bld: 104 mg/dL — ABNORMAL HIGH (ref 70–99)

## 2011-07-27 ENCOUNTER — Encounter (HOSPITAL_BASED_OUTPATIENT_CLINIC_OR_DEPARTMENT_OTHER)
Admission: RE | Disposition: A | Discharge status: Home / Self Care | Payer: Self-pay | Source: Ambulatory Visit | Attending: Orthopedic Surgery

## 2011-07-27 ENCOUNTER — Encounter (HOSPITAL_BASED_OUTPATIENT_CLINIC_OR_DEPARTMENT_OTHER): Payer: Self-pay | Admitting: Certified Registered Nurse Anesthetist

## 2011-07-27 ENCOUNTER — Encounter (HOSPITAL_BASED_OUTPATIENT_CLINIC_OR_DEPARTMENT_OTHER): Payer: Self-pay | Admitting: *Deleted

## 2011-07-27 ENCOUNTER — Ambulatory Visit (HOSPITAL_BASED_OUTPATIENT_CLINIC_OR_DEPARTMENT_OTHER): Payer: Medicare Other | Admitting: Certified Registered Nurse Anesthetist

## 2011-07-27 ENCOUNTER — Ambulatory Visit (HOSPITAL_BASED_OUTPATIENT_CLINIC_OR_DEPARTMENT_OTHER)
Admission: RE | Admit: 2011-07-27 | Discharge: 2011-07-27 | Disposition: A | Discharge status: Home / Self Care | Payer: Medicare Other | Source: Ambulatory Visit | Attending: Orthopedic Surgery | Admitting: Orthopedic Surgery

## 2011-07-27 DIAGNOSIS — E039 Hypothyroidism, unspecified: Secondary | ICD-10-CM | POA: Insufficient documentation

## 2011-07-27 DIAGNOSIS — Z79899 Other long term (current) drug therapy: Secondary | ICD-10-CM | POA: Insufficient documentation

## 2011-07-27 DIAGNOSIS — I252 Old myocardial infarction: Secondary | ICD-10-CM | POA: Insufficient documentation

## 2011-07-27 DIAGNOSIS — M23329 Other meniscus derangements, posterior horn of medial meniscus, unspecified knee: Secondary | ICD-10-CM | POA: Diagnosis not present

## 2011-07-27 DIAGNOSIS — I10 Essential (primary) hypertension: Secondary | ICD-10-CM | POA: Insufficient documentation

## 2011-07-27 DIAGNOSIS — M23359 Other meniscus derangements, posterior horn of lateral meniscus, unspecified knee: Secondary | ICD-10-CM | POA: Insufficient documentation

## 2011-07-27 DIAGNOSIS — F329 Major depressive disorder, single episode, unspecified: Secondary | ICD-10-CM | POA: Insufficient documentation

## 2011-07-27 DIAGNOSIS — IMO0002 Reserved for concepts with insufficient information to code with codable children: Secondary | ICD-10-CM | POA: Diagnosis not present

## 2011-07-27 DIAGNOSIS — S83289A Other tear of lateral meniscus, current injury, unspecified knee, initial encounter: Secondary | ICD-10-CM | POA: Diagnosis not present

## 2011-07-27 DIAGNOSIS — M2240 Chondromalacia patellae, unspecified knee: Secondary | ICD-10-CM | POA: Diagnosis not present

## 2011-07-27 DIAGNOSIS — G4730 Sleep apnea, unspecified: Secondary | ICD-10-CM | POA: Diagnosis not present

## 2011-07-27 DIAGNOSIS — F3289 Other specified depressive episodes: Secondary | ICD-10-CM | POA: Insufficient documentation

## 2011-07-27 HISTORY — DX: Depression, unspecified: F32.A

## 2011-07-27 HISTORY — DX: Major depressive disorder, single episode, unspecified: F32.9

## 2011-07-27 HISTORY — DX: Sleep apnea, unspecified: G47.30

## 2011-07-27 HISTORY — DX: Anemia, unspecified: D64.9

## 2011-07-27 HISTORY — DX: Other seasonal allergic rhinitis: J30.2

## 2011-07-27 HISTORY — PX: KNEE ARTHROSCOPY W/ MENISCECTOMY: SHX1879

## 2011-07-27 SURGERY — ARTHROSCOPY, KNEE, WITH MEDIAL MENISCECTOMY
Anesthesia: General | Site: Knee | Laterality: Right | Wound class: Clean

## 2011-07-27 MED ORDER — PROPOFOL 10 MG/ML IV EMUL
INTRAVENOUS | Status: DC | PRN
  Administered 2011-07-27: 160 mg via INTRAVENOUS

## 2011-07-27 MED ORDER — ONDANSETRON HCL 4 MG/2ML IJ SOLN
INTRAMUSCULAR | Status: DC | PRN
  Administered 2011-07-27: 4 mg via INTRAVENOUS

## 2011-07-27 MED ORDER — LACTATED RINGERS IV SOLN: INTRAVENOUS | Status: DC

## 2011-07-27 MED ORDER — PROMETHAZINE HCL 25 MG/ML IJ SOLN: 6.2500 mg | INTRAMUSCULAR | Status: DC | PRN

## 2011-07-27 MED ORDER — CHLORHEXIDINE GLUCONATE 4 % EX LIQD: 60.0000 mL | Freq: Once | CUTANEOUS | Status: DC

## 2011-07-27 MED ORDER — BUPIVACAINE-EPINEPHRINE PF 0.5-1:200000 % IJ SOLN
INTRAMUSCULAR | Status: DC | PRN
  Administered 2011-07-27: 15 mL
  Administered 2011-07-27: 20 mL

## 2011-07-27 MED ORDER — CEFAZOLIN SODIUM-DEXTROSE 2-3 GM-% IV SOLR
2.0000 g | INTRAVENOUS | Status: AC
  Administered 2011-07-27: 2 g via INTRAVENOUS

## 2011-07-27 MED ORDER — LACTATED RINGERS IV SOLN
INTRAVENOUS | Status: DC
  Administered 2011-07-27: 09:00:00 via INTRAVENOUS

## 2011-07-27 MED ORDER — DEXAMETHASONE SODIUM PHOSPHATE 4 MG/ML IJ SOLN
INTRAMUSCULAR | Status: DC | PRN
  Administered 2011-07-27: 4 mg via INTRAVENOUS

## 2011-07-27 MED ORDER — MIDAZOLAM HCL 2 MG/2ML IJ SOLN
1.0000 mg | INTRAMUSCULAR | Status: DC | PRN
  Administered 2011-07-27: 2 mg via INTRAVENOUS

## 2011-07-27 MED ORDER — HYDROCODONE-ACETAMINOPHEN 5-325 MG PO TABS
1.0000 | ORAL_TABLET | Freq: Once | ORAL | Status: AC
  Administered 2011-07-27: 1 via ORAL

## 2011-07-27 MED ORDER — POVIDONE-IODINE 7.5 % EX SOLN: Freq: Once | CUTANEOUS | Status: DC

## 2011-07-27 MED ORDER — HYDROMORPHONE HCL PF 1 MG/ML IJ SOLN: 0.2500 mg | INTRAMUSCULAR | Status: DC | PRN

## 2011-07-27 MED ORDER — FENTANYL CITRATE 0.05 MG/ML IJ SOLN
50.0000 ug | INTRAMUSCULAR | Status: DC | PRN
  Administered 2011-07-27: 100 ug via INTRAVENOUS

## 2011-07-27 MED ORDER — LIDOCAINE HCL 1.5 % IJ SOLN
INTRAMUSCULAR | Status: DC | PRN
  Administered 2011-07-27: 155 mL via INTRADERMAL

## 2011-07-27 MED ORDER — SODIUM CHLORIDE 0.9 % IR SOLN
Status: DC | PRN
  Administered 2011-07-27: 6000 mL

## 2011-07-27 MED ORDER — EPINEPHRINE HCL 1 MG/ML IJ SOLN
INTRAMUSCULAR | Status: DC | PRN
  Administered 2011-07-27: 1 mg

## 2011-07-27 MED ORDER — EPHEDRINE SULFATE 50 MG/ML IJ SOLN
INTRAMUSCULAR | Status: DC | PRN
  Administered 2011-07-27 (×3): 10 mg via INTRAVENOUS

## 2011-07-27 MED ORDER — FENTANYL CITRATE 0.05 MG/ML IJ SOLN
INTRAMUSCULAR | Status: DC | PRN
  Administered 2011-07-27: 50 ug via INTRAVENOUS

## 2011-07-27 MED ORDER — MIDAZOLAM HCL 5 MG/5ML IJ SOLN
INTRAMUSCULAR | Status: DC | PRN
  Administered 2011-07-27: 1 mg via INTRAVENOUS

## 2011-07-27 MED ORDER — LORAZEPAM 2 MG/ML IJ SOLN: 1.0000 mg | Freq: Once | INTRAMUSCULAR | Status: DC | PRN

## 2011-07-27 MED ORDER — LIDOCAINE HCL (CARDIAC) 20 MG/ML IV SOLN
INTRAVENOUS | Status: DC | PRN
  Administered 2011-07-27: 60 mg via INTRAVENOUS

## 2011-07-27 SURGICAL SUPPLY — 41 items
BANDAGE ELASTIC 6 VELCRO ST LF (GAUZE/BANDAGES/DRESSINGS) ×2 IMPLANT
BLADE CUTTER GATOR 3.5 (BLADE) ×1 IMPLANT
BLADE GREAT WHITE 4.2 (BLADE) ×2 IMPLANT
BLADE SURG 15 STRL LF DISP TIS (BLADE) IMPLANT
BLADE SURG 15 STRL SS (BLADE)
BNDG COHESIVE 4X5 TAN STRL (GAUZE/BANDAGES/DRESSINGS) IMPLANT
CANISTER OMNI JUG 16 LITER (MISCELLANEOUS) IMPLANT
CANISTER SUCTION 2500CC (MISCELLANEOUS) IMPLANT
DRAPE ARTHROSCOPY W/POUCH 90 (DRAPES) ×2 IMPLANT
DURAPREP 26ML APPLICATOR (WOUND CARE) ×2 IMPLANT
GAUZE XEROFORM 1X8 LF (GAUZE/BANDAGES/DRESSINGS) ×1 IMPLANT
GLOVE BIO SURGEON STRL SZ7 (GLOVE) ×1 IMPLANT
GLOVE BIOGEL M 6.5 STRL (GLOVE) ×2 IMPLANT
GLOVE BIOGEL PI IND STRL 7.0 (GLOVE) ×1 IMPLANT
GLOVE BIOGEL PI IND STRL 7.5 (GLOVE) ×1 IMPLANT
GLOVE BIOGEL PI INDICATOR 7.0 (GLOVE) ×2
GLOVE BIOGEL PI INDICATOR 7.5 (GLOVE) ×1
GLOVE SS BIOGEL STRL SZ 7.5 (GLOVE) ×1 IMPLANT
GLOVE SUPERSENSE BIOGEL SZ 7.5 (GLOVE) ×1
GOWN PREVENTION PLUS XLARGE (GOWN DISPOSABLE) ×2 IMPLANT
HOLDER KNEE FOAM BLUE (MISCELLANEOUS) ×2 IMPLANT
KNEE WRAP E Z 3 GEL PACK (MISCELLANEOUS) ×2 IMPLANT
NDL SAFETY ECLIPSE 18X1.5 (NEEDLE) ×2 IMPLANT
NEEDLE HYPO 18GX1.5 SHARP (NEEDLE)
NEEDLE HYPO 22GX1.5 SAFETY (NEEDLE) IMPLANT
PACK ARTHROSCOPY DSU (CUSTOM PROCEDURE TRAY) ×2 IMPLANT
PACK BASIN DAY SURGERY FS (CUSTOM PROCEDURE TRAY) ×2 IMPLANT
PAD ALCOHOL SWAB (MISCELLANEOUS) IMPLANT
SET ARTHROSCOPY TUBING (MISCELLANEOUS) ×2
SET ARTHROSCOPY TUBING LN (MISCELLANEOUS) ×1 IMPLANT
SPONGE GAUZE 4X4 12PLY (GAUZE/BANDAGES/DRESSINGS) ×2 IMPLANT
SUCTION FRAZIER TIP 10 FR DISP (SUCTIONS) IMPLANT
SUT ETHILON 4 0 PS 2 18 (SUTURE) ×2 IMPLANT
SUT PROLENE 3 0 PS 2 (SUTURE) IMPLANT
SUT VIC AB 3-0 PS1 18 (SUTURE)
SUT VIC AB 3-0 PS1 18XBRD (SUTURE) IMPLANT
SYR 20CC LL (SYRINGE) IMPLANT
SYR 5ML LL (SYRINGE) ×2 IMPLANT
TOWEL OR 17X24 6PK STRL BLUE (TOWEL DISPOSABLE) ×2 IMPLANT
WAND STAR VAC 90 (SURGICAL WAND) IMPLANT
WATER STERILE IRR 1000ML POUR (IV SOLUTION) ×2 IMPLANT

## 2011-07-27 NOTE — Interval H&P Note (Signed)
History and Physical Interval Note:  07/27/2011 9:22 AM  Laura [name]  has presented today for surgery, with the diagnosis of right knee mmt  The various methods of treatment have been discussed with the patient and family. After consideration of risks, benefits and other options for treatment, the patient has consented to  Procedure(s): KNEE ARTHROSCOPY RIGHT  WITH MEDIAL MENISECTOMY as a surgical intervention .  The patients' history has been reviewed, patient examined, no change in status, stable for surgery.  I have reviewed the patients' chart and labs.  Questions were answered to the patient's satisfaction.     Salvatore Marvel A

## 2011-07-27 NOTE — H&P (View-Only) (Signed)
Laura [name] is an 66 y.o. female.   Chief Complaint: right knee pain HPI: Laura [name] is a 66 year old seen for evaluation for significant increasing right knee pain. She saw Dr. Farris Has on 06/11/11 with medial meniscus tear, her symptoms had improved but over the past 2 weeks pain has gotten significantly worse. She had an MRI on 06/09/11 that showed a medial meniscus tear.  She cannot walk without significant medial joint line pain.   Past Medical History  Diagnosis Date  . Myocardial infarct   . Enlarged heart   . Hypertension   . Thyroid disease   . Hypothyroidism     Past Surgical History  Procedure Date  . Abdominal hysterectomy   . Nasal septum surgery 1980's    Family History  Problem Relation Age of Onset  . Pneumonia Mother   . Hypertension Mother   . Heart attack Father   . Fibromyalgia Brother   . Pulmonary embolism Brother   . Hypertension Brother    Social History:  reports that she has never smoked. She does not have any smokeless tobacco history on file. She reports that she does not drink alcohol or use illicit drugs.  Allergies:  Allergies  Allergen Reactions  . Codeine Hives   Current Outpatient Prescriptions on File Prior to Visit  Medication Sig Dispense Refill  . atorvastatin (LIPITOR) 80 MG tablet Take 80 mg by mouth daily.        . bumetanide (BUMEX) 1 MG tablet Take 1 mg by mouth daily.        Marland Kitchen estradiol (ESTRACE) 1 MG tablet Take 1 mg by mouth daily.        Marland Kitchen levothyroxine (SYNTHROID, LEVOTHROID) 50 MCG tablet Take 50 mcg by mouth daily.        Marland Kitchen losartan-hydrochlorothiazide (HYZAAR) 100-25 MG per tablet Take 1 tablet by mouth 2 (two) times daily.        . metoprolol (TOPROL-XL) 50 MG 24 hr tablet Take 50 mg by mouth daily.        . potassium chloride (K-DUR) 10 MEQ tablet Take 1 tablet (10 mEq total) by mouth daily.  30 tablet  11  . aspirin EC 81 MG EC tablet Take 1 tablet (81 mg total) by mouth daily.        No results found for this or  any previous visit (from the past 48 hour(s)). No results found.  Review of Systems  Constitutional: Negative.   HENT: Negative.   Eyes: Negative.   Respiratory: Negative.   Cardiovascular: Negative.   Gastrointestinal: Negative.   Genitourinary: Negative.   Musculoskeletal:       Right knee medial pain  Skin: Negative.   Neurological: Negative.   Endo/Heme/Allergies: Negative.   Psychiatric/Behavioral: Negative.     There were no vitals taken for this visit. Physical Exam  Constitutional: She is oriented to person, place, and time. She appears well-developed and well-nourished.  HENT:  Head: Normocephalic and atraumatic.  Mouth/Throat: Oropharynx is clear and moist.  Eyes: EOM are normal. Pupils are equal, round, and reactive to light.  Neck: Neck supple.  Cardiovascular: Normal rate.   Respiratory: Effort normal.  GI: Soft.  Genitourinary:       Not pertinent to current symptomatology therefore not examined.  Musculoskeletal:       Examination of her right knee reveals pain over the medial joint line positive medial McMurray's 1+ effusion, range of motion 0-100 degrees knee is stable. Exam of the  left knee reveals full range of motion without pain swelling weakness or instability  Neurological: She is alert and oriented to person, place, and time.  Skin: Skin is warm and dry.  Psychiatric: She has a normal mood and affect. Her behavior is normal. Thought content normal.     Assessment Patient Active Problem List  Diagnoses  . Chest pain neg. MI, hx of patent coronary arteries.   . Hypokalemia resolved  . Takotsubo cardiomyopathy, history of  . CHF (congestive heart failure), NYHA class I:   . Medial meniscus tear right knee     Plan Right knee arthroscopy with medial menisectomy.  The risks, benefits, and possible complications of the procedure were discussed in detail with the patient.  The patient is without question.  Alaya Iverson J 07/21/2011, 10:29  AM

## 2011-07-27 NOTE — Op Note (Signed)
NAME:  Laura [name], Laura [name]                    ACCOUNT NO.:  MEDICAL RECORD NO.:  [id number]  LOCATION:                                 FACILITY:  PHYSICIAN:  Mlissa Tamayo A. Thurston Hole, M.D. DATE OF BIRTH:  1945-10-13  DATE OF PROCEDURE:  07/27/2011 DATE OF DISCHARGE:                              OPERATIVE REPORT   PREOPERATIVE DIAGNOSIS:  Right knee medial and lateral meniscal tears with chondromalacia.  POSTOPERATIVE DIAGNOSIS:  Right knee medial and lateral meniscal tears with chondromalacia.  PROCEDURE:  Right knee EUA, followed by arthroscopic partial medial and lateral meniscectomies with chondroplasty.  SURGEON:  Elana Alm. Thurston Hole, M.D.  ANESTHESIA:  General.  OPERATIVE TIME:  30 minutes.  COMPLICATIONS:  None.  INDICATION FOR PROCEDURE:  Laura [name] is a 66 year old woman who has had painful recurrent locking and catching in her right knee for the past 3-4 months with exam and MRI documenting meniscal tearing with chondromalacia.  She has failed multiple surgical modalities including medications and injections.  She has significant disability and significant limping and thus, she is now to undergo arthroscopy.  DESCRIPTION OF PROCEDURE:  Laura [name] was brought to the operating room on July 27, 2011, after knee block was placed in the holding by Anesthesia.  She was placed on the operative table in supine position. She received Ancef 1 g IV preoperatively for prophylaxis.  After being placed under general anesthesia, her right knee was examined.  She had full range of motion.  Knee was stable.  Ligamentous exam with normal patellar tracking.  The right leg was prepped using sterile DuraPrep and draped using sterile technique.  A time-out procedure was called and the correct right knee was identified.  Initially, through an anterolateral portal, the arthroscope with a pump attached was placed into an anteromedial portal and an arthroscopic probe was placed.  On  initial inspection of the medial compartment, she had 30% grade 3 chondromalacia, which was debrided.  She had a complex tearing of the posterior medial horn of the medial meniscus of which 40-50% was resected back to a stable rim.  Intercondylar notch was inspected. Anterior and posterior cruciate ligaments were normal.  Lateral compartment was inspected.  The articular cartilage showed 30% grade 3 chondromalacia, which was debrided.  Lateral meniscus showed a partial tearing 25% posterolateral corner, which was resected back to a stable rim.  Patellofemoral joint showed 50% grade 3 chondromalacia on the patella, which was debrided.  Grade 1 and 2 changes noted in the femoral groove.  The patella tracked normally.  Medial and lateral gutters were otherwise free of pathology.  After this was done, it was felt that all pathology had been satisfactorily addressed.  The instruments were removed.  Portals were closed with 3-0 nylon suture.  Sterile dressings were applied.  The patient awakened and taken to recovery room in stable condition.  FOLLOWUP CARE:  Laura [name] will be followed as an outpatient, on Norco for pain.  She will be seen back in the office in a week for sutures out and followup.     Anjelique Makar A. Thurston Hole, M.D.     RAW/MEDQ  D:  07/27/2011  T:  07/27/2011  Job:  [id number]

## 2011-07-27 NOTE — Progress Notes (Signed)
Patient does not have and can not wear CPAP.  Warned patient of increased sedation tonight, tomorrow night due to anesthesia and pain meds.

## 2011-07-27 NOTE — Transfer of Care (Signed)
Immediate Anesthesia Transfer of Care Note  Patient: Laura [name]  Procedure(s) Performed:  KNEE ARTHROSCOPY WITH MEDIAL MENISECTOMY  Patient Location: PACU  Anesthesia Type: General and Regional  Level of Consciousness: awake, alert , oriented and patient cooperative  Airway & Oxygen Therapy: Patient Spontanous Breathing and Patient connected to face mask oxygen  Post-op Assessment: Report given to PACU RN and Post -op Vital signs reviewed and stable  Post vital signs: Reviewed and stable  Complications: No apparent anesthesia complications

## 2011-07-27 NOTE — Anesthesia Postprocedure Evaluation (Signed)
  Anesthesia Post-op Note  Patient: Laura [name]  Procedure(s) Performed:  KNEE ARTHROSCOPY WITH MEDIAL MENISECTOMY - Medial and Lateral Partial Meniscectomy, Chondroplasty  Patient Location: PACU  Anesthesia Type: General  Level of Consciousness: awake and alert   Airway and Oxygen Therapy: Patient Spontanous Breathing  Post-op Pain: mild  Post-op Assessment: Post-op Vital signs reviewed, Patient's Cardiovascular Status Stable, Respiratory Function Stable, Patent Airway, No signs of Nausea or vomiting and Pain level controlled  Post-op Vital Signs: stable  Complications: No apparent anesthesia complications

## 2011-07-27 NOTE — Progress Notes (Signed)
Assisted Dr. Kasik with right knee block. Side rails up, monitors on throughout procedure. See vital signs in flow sheet. Tolerated Procedure well. 

## 2011-07-27 NOTE — Brief Op Note (Signed)
07/27/2011  10:00 AM  PATIENT:  Laura [name]  66 y.o. female  PRE-OPERATIVE DIAGNOSIS:  Right Knee Medial Meniscal Tear  POST-OPERATIVE DIAGNOSIS:  Right Knee Medial and lateral Meniscal Tear  PROCEDURE:  Procedure(s): KNEE ARTHROSCOPY WITH MEDIAL AND LATERAL MENISECTOMY  SURGEON:  Surgeon(s): Nilda Simmer, MD  PHYSICIAN ASSISTANT: NONE  ASSISTANTS: none   ANESTHESIA:   general  EBL:  Total I/O In: 700 [I.V.:700] Out: -   BLOOD ADMINISTERED:none  DRAINS: none   LOCAL MEDICATIONS USED:  NONE  SPECIMEN:  No Specimen  DISPOSITION OF SPECIMEN:  N/A  COUNTS:  YES  TOURNIQUET:  * No tourniquets in log *  DICTATION: .Other Dictation: Dictation Number [id number]  PLAN OF CARE: Discharge to home after PACU  PATIENT DISPOSITION:  PACU - hemodynamically stable.   Delay start of Pharmacological VTE agent (>24hrs) due to surgical blood loss or risk of bleeding:  NA

## 2011-07-27 NOTE — Anesthesia Preprocedure Evaluation (Signed)
Anesthesia Evaluation  Patient identified by MRN, date of birth, ID band Patient awake    Reviewed: Allergy & Precautions, H&P , NPO status , Patient's Chart, lab work & pertinent test results  Airway Mallampati: II TM Distance: >3 FB Neck ROM: Full    Dental   Pulmonary sleep apnea ,    Pulmonary exam normal       Cardiovascular hypertension, +CHF     Neuro/Psych Depression    GI/Hepatic   Endo/Other  Hypothyroidism   Renal/GU      Musculoskeletal   Abdominal (+) obese,   Peds  Hematology   Anesthesia Other Findings   Reproductive/Obstetrics                           Anesthesia Physical Anesthesia Plan  ASA: III  Anesthesia Plan: General   Post-op Pain Management:    Induction: Intravenous  Airway Management Planned: LMA  Additional Equipment:   Intra-op Plan:   Post-operative Plan: Extubation in OR  Informed Consent:   Plan Discussed with: CRNA and Surgeon  Anesthesia Plan Comments: (Will do preop portal infiltration)        Anesthesia Quick Evaluation

## 2011-07-27 NOTE — Anesthesia Procedure Notes (Addendum)
Anesthesia Regional Block:    Pre-Anesthetic Checklist: ,, timeout performed, Correct Patient, Correct Site, Correct Laterality, Correct Procedure, Correct Position, site marked, Risks and benefits discussed, Surgical consent,  Pre-op evaluation,  At surgeon's request  Laterality: Right  Prep: chloraprep       Needles:  Injection technique: Single-shot  Needle Type: Other   (22g straight)      Needle insertion depth: 3 cm   Additional Needles:  Procedures: other  (infiltrative)  Narrative:  Start time: 07/27/2011 8:26 AM End time: 07/27/2011 8:38 AM Anesthesiologist: Dr Gypsy Balsam  Additional Notes: R knee bil inf portal block Fbp, sterile tech, chg prep 22 straight needle with bil inf portal location infiltration Multiple neg asp No compl Marc 0.5% w/epi 1:200000 15cc+ Lido 1.5% 15cc total Dr Gypsy Balsam   Procedure Name: LMA Insertion Date/Time: 07/27/2011 9:35 AM Performed by: Jacksen Isip D Pre-anesthesia Checklist: Patient identified, Emergency Drugs available, Suction available and Patient being monitored Patient Re-evaluated:Patient Re-evaluated prior to inductionOxygen Delivery Method: Circle System Utilized Preoxygenation: Pre-oxygenation with 100% oxygen Intubation Type: IV induction Ventilation: Mask ventilation without difficulty LMA: LMA inserted LMA Size: 4.0 Number of attempts: 1 Placement Confirmation: positive ETCO2 Tube secured with: Tape Dental Injury: Teeth and Oropharynx as per pre-operative assessment

## 2011-09-02 DIAGNOSIS — R0602 Shortness of breath: Secondary | ICD-10-CM | POA: Diagnosis not present

## 2011-09-02 DIAGNOSIS — Z79899 Other long term (current) drug therapy: Secondary | ICD-10-CM | POA: Diagnosis not present

## 2011-09-02 DIAGNOSIS — I447 Left bundle-branch block, unspecified: Secondary | ICD-10-CM | POA: Diagnosis not present

## 2011-10-02 DIAGNOSIS — E782 Mixed hyperlipidemia: Secondary | ICD-10-CM | POA: Diagnosis not present

## 2011-10-02 DIAGNOSIS — R7309 Other abnormal glucose: Secondary | ICD-10-CM | POA: Diagnosis not present

## 2011-10-02 DIAGNOSIS — Z79899 Other long term (current) drug therapy: Secondary | ICD-10-CM | POA: Diagnosis not present

## 2011-10-02 DIAGNOSIS — E559 Vitamin D deficiency, unspecified: Secondary | ICD-10-CM | POA: Diagnosis not present

## 2011-10-02 DIAGNOSIS — R0602 Shortness of breath: Secondary | ICD-10-CM | POA: Diagnosis not present

## 2011-10-02 DIAGNOSIS — I10 Essential (primary) hypertension: Secondary | ICD-10-CM | POA: Diagnosis not present

## 2011-10-02 DIAGNOSIS — J209 Acute bronchitis, unspecified: Secondary | ICD-10-CM | POA: Diagnosis not present

## 2011-10-14 DIAGNOSIS — G4730 Sleep apnea, unspecified: Secondary | ICD-10-CM | POA: Diagnosis not present

## 2011-10-14 DIAGNOSIS — R0609 Other forms of dyspnea: Secondary | ICD-10-CM | POA: Diagnosis not present

## 2011-10-14 DIAGNOSIS — I10 Essential (primary) hypertension: Secondary | ICD-10-CM | POA: Diagnosis not present

## 2011-10-14 DIAGNOSIS — G4710 Hypersomnia, unspecified: Secondary | ICD-10-CM | POA: Diagnosis not present

## 2011-10-21 ENCOUNTER — Other Ambulatory Visit (HOSPITAL_COMMUNITY): Payer: Self-pay | Admitting: Internal Medicine

## 2011-10-21 ENCOUNTER — Ambulatory Visit (HOSPITAL_COMMUNITY)
Admission: RE | Admit: 2011-10-21 | Discharge: 2011-10-21 | Disposition: A | Discharge status: Home / Self Care | Payer: Medicare Other | Source: Ambulatory Visit | Attending: Internal Medicine | Admitting: Internal Medicine

## 2011-10-21 DIAGNOSIS — R05 Cough: Secondary | ICD-10-CM

## 2011-10-21 DIAGNOSIS — R059 Cough, unspecified: Secondary | ICD-10-CM | POA: Diagnosis not present

## 2011-10-21 DIAGNOSIS — I10 Essential (primary) hypertension: Secondary | ICD-10-CM | POA: Diagnosis not present

## 2011-10-28 DIAGNOSIS — M7989 Other specified soft tissue disorders: Secondary | ICD-10-CM | POA: Diagnosis not present

## 2011-10-30 DIAGNOSIS — I509 Heart failure, unspecified: Secondary | ICD-10-CM | POA: Diagnosis not present

## 2011-10-30 DIAGNOSIS — I10 Essential (primary) hypertension: Secondary | ICD-10-CM | POA: Diagnosis not present

## 2011-11-17 DIAGNOSIS — I10 Essential (primary) hypertension: Secondary | ICD-10-CM | POA: Diagnosis not present

## 2011-11-17 DIAGNOSIS — E782 Mixed hyperlipidemia: Secondary | ICD-10-CM | POA: Diagnosis not present

## 2011-11-17 DIAGNOSIS — E559 Vitamin D deficiency, unspecified: Secondary | ICD-10-CM | POA: Diagnosis not present

## 2011-11-17 DIAGNOSIS — Z79899 Other long term (current) drug therapy: Secondary | ICD-10-CM | POA: Diagnosis not present

## 2011-12-22 DIAGNOSIS — M2240 Chondromalacia patellae, unspecified knee: Secondary | ICD-10-CM | POA: Diagnosis not present

## 2011-12-22 DIAGNOSIS — M1710 Unilateral primary osteoarthritis, unspecified knee: Secondary | ICD-10-CM | POA: Diagnosis not present

## 2012-01-12 DIAGNOSIS — M1710 Unilateral primary osteoarthritis, unspecified knee: Secondary | ICD-10-CM | POA: Diagnosis not present

## 2012-01-12 DIAGNOSIS — M19079 Primary osteoarthritis, unspecified ankle and foot: Secondary | ICD-10-CM | POA: Diagnosis not present

## 2012-02-08 DIAGNOSIS — M19079 Primary osteoarthritis, unspecified ankle and foot: Secondary | ICD-10-CM | POA: Diagnosis not present

## 2012-02-08 DIAGNOSIS — M1710 Unilateral primary osteoarthritis, unspecified knee: Secondary | ICD-10-CM | POA: Diagnosis not present

## 2012-02-16 DIAGNOSIS — E782 Mixed hyperlipidemia: Secondary | ICD-10-CM | POA: Diagnosis not present

## 2012-02-16 DIAGNOSIS — R5381 Other malaise: Secondary | ICD-10-CM | POA: Diagnosis not present

## 2012-02-16 DIAGNOSIS — I10 Essential (primary) hypertension: Secondary | ICD-10-CM | POA: Diagnosis not present

## 2012-02-16 DIAGNOSIS — R5383 Other fatigue: Secondary | ICD-10-CM | POA: Diagnosis not present

## 2012-02-16 DIAGNOSIS — D510 Vitamin B12 deficiency anemia due to intrinsic factor deficiency: Secondary | ICD-10-CM | POA: Diagnosis not present

## 2012-02-16 DIAGNOSIS — Z79899 Other long term (current) drug therapy: Secondary | ICD-10-CM | POA: Diagnosis not present

## 2012-02-16 DIAGNOSIS — N3000 Acute cystitis without hematuria: Secondary | ICD-10-CM | POA: Diagnosis not present

## 2012-02-16 DIAGNOSIS — E559 Vitamin D deficiency, unspecified: Secondary | ICD-10-CM | POA: Diagnosis not present

## 2012-02-22 DIAGNOSIS — G4710 Hypersomnia, unspecified: Secondary | ICD-10-CM | POA: Diagnosis not present

## 2012-03-10 DIAGNOSIS — H04129 Dry eye syndrome of unspecified lacrimal gland: Secondary | ICD-10-CM | POA: Diagnosis not present

## 2012-03-31 DIAGNOSIS — H53039 Strabismic amblyopia, unspecified eye: Secondary | ICD-10-CM | POA: Diagnosis not present

## 2012-03-31 DIAGNOSIS — H43819 Vitreous degeneration, unspecified eye: Secondary | ICD-10-CM | POA: Diagnosis not present

## 2012-03-31 DIAGNOSIS — H5010 Unspecified exotropia: Secondary | ICD-10-CM | POA: Diagnosis not present

## 2012-03-31 DIAGNOSIS — Z23 Encounter for immunization: Secondary | ICD-10-CM | POA: Diagnosis not present

## 2012-04-13 DIAGNOSIS — H11829 Conjunctivochalasis, unspecified eye: Secondary | ICD-10-CM | POA: Diagnosis not present

## 2012-04-14 DIAGNOSIS — IMO0002 Reserved for concepts with insufficient information to code with codable children: Secondary | ICD-10-CM | POA: Diagnosis not present

## 2012-04-19 DIAGNOSIS — M1710 Unilateral primary osteoarthritis, unspecified knee: Secondary | ICD-10-CM | POA: Diagnosis not present

## 2012-04-20 DIAGNOSIS — N318 Other neuromuscular dysfunction of bladder: Secondary | ICD-10-CM | POA: Diagnosis not present

## 2012-04-20 DIAGNOSIS — Z130 Encounter for screening for diseases of the blood and blood-forming organs and certain disorders involving the immune mechanism: Secondary | ICD-10-CM | POA: Diagnosis not present

## 2012-04-20 DIAGNOSIS — Z1231 Encounter for screening mammogram for malignant neoplasm of breast: Secondary | ICD-10-CM | POA: Diagnosis not present

## 2012-04-20 DIAGNOSIS — Z01419 Encounter for gynecological examination (general) (routine) without abnormal findings: Secondary | ICD-10-CM | POA: Diagnosis not present

## 2012-04-23 ENCOUNTER — Encounter (HOSPITAL_COMMUNITY): Payer: Self-pay | Admitting: *Deleted

## 2012-04-23 ENCOUNTER — Emergency Department (HOSPITAL_COMMUNITY)
Admission: EM | Admit: 2012-04-23 | Discharge: 2012-04-24 | Disposition: A | Discharge status: Home / Self Care | Payer: Medicare Other | Attending: Emergency Medicine | Admitting: Emergency Medicine

## 2012-04-23 DIAGNOSIS — Z79899 Other long term (current) drug therapy: Secondary | ICD-10-CM | POA: Diagnosis not present

## 2012-04-23 DIAGNOSIS — R079 Chest pain, unspecified: Secondary | ICD-10-CM | POA: Diagnosis not present

## 2012-04-23 DIAGNOSIS — F329 Major depressive disorder, single episode, unspecified: Secondary | ICD-10-CM | POA: Insufficient documentation

## 2012-04-23 DIAGNOSIS — F3289 Other specified depressive episodes: Secondary | ICD-10-CM | POA: Insufficient documentation

## 2012-04-23 DIAGNOSIS — E039 Hypothyroidism, unspecified: Secondary | ICD-10-CM | POA: Diagnosis not present

## 2012-04-23 DIAGNOSIS — G4730 Sleep apnea, unspecified: Secondary | ICD-10-CM | POA: Insufficient documentation

## 2012-04-23 DIAGNOSIS — J301 Allergic rhinitis due to pollen: Secondary | ICD-10-CM | POA: Diagnosis not present

## 2012-04-23 DIAGNOSIS — Z87442 Personal history of urinary calculi: Secondary | ICD-10-CM | POA: Diagnosis not present

## 2012-04-23 DIAGNOSIS — R0602 Shortness of breath: Secondary | ICD-10-CM | POA: Insufficient documentation

## 2012-04-23 DIAGNOSIS — I10 Essential (primary) hypertension: Secondary | ICD-10-CM | POA: Insufficient documentation

## 2012-04-23 DIAGNOSIS — D649 Anemia, unspecified: Secondary | ICD-10-CM | POA: Insufficient documentation

## 2012-04-23 DIAGNOSIS — I509 Heart failure, unspecified: Secondary | ICD-10-CM | POA: Diagnosis not present

## 2012-04-23 LAB — CBC
HCT: 38.4 % (ref 36.0–46.0)
Hemoglobin: 12.2 g/dL (ref 12.0–15.0)
MCH: 24.5 pg — ABNORMAL LOW (ref 26.0–34.0)
MCHC: 31.8 g/dL (ref 30.0–36.0)
MCV: 77.3 fL — ABNORMAL LOW (ref 78.0–100.0)
Platelets: 293 10*3/uL (ref 150–400)
RBC: 4.97 MIL/uL (ref 3.87–5.11)
RDW: 14.1 % (ref 11.5–15.5)
WBC: 7.4 K/uL (ref 4.0–10.5)

## 2012-04-23 LAB — POCT I-STAT TROPONIN I: Troponin i, poc: 0.02 ng/mL (ref 0.00–0.08)

## 2012-04-23 NOTE — ED Notes (Signed)
Pt with hx of MI in 2011 to ED c/o acute onset chest pain at 2330.  She was laying in bed when pain hit the L side of chest.  Pt states feels like her previous MI.  Pt took 1 81 mg asa and  1 0.4 mg SL with some relief.

## 2012-04-24 ENCOUNTER — Encounter (HOSPITAL_COMMUNITY): Payer: Self-pay | Admitting: Emergency Medicine

## 2012-04-24 ENCOUNTER — Emergency Department (HOSPITAL_COMMUNITY): Payer: Medicare Other

## 2012-04-24 DIAGNOSIS — R079 Chest pain, unspecified: Secondary | ICD-10-CM | POA: Diagnosis not present

## 2012-04-24 DIAGNOSIS — R0602 Shortness of breath: Secondary | ICD-10-CM | POA: Diagnosis not present

## 2012-04-24 LAB — COMPREHENSIVE METABOLIC PANEL WITH GFR
Alkaline Phosphatase: 60 U/L (ref 39–117)
BUN: 15 mg/dL (ref 6–23)
CO2: 34 meq/L — ABNORMAL HIGH (ref 19–32)
Chloride: 97 meq/L (ref 96–112)
Creatinine, Ser: 0.71 mg/dL (ref 0.50–1.10)
GFR calc non Af Amer: 88 mL/min — ABNORMAL LOW (ref >90)
Glucose, Bld: 161 mg/dL — ABNORMAL HIGH (ref 70–99)
Potassium: 3.6 meq/L (ref 3.5–5.1)
Total Bilirubin: 0.1 mg/dL — ABNORMAL LOW (ref 0.3–1.2)

## 2012-04-24 LAB — COMPREHENSIVE METABOLIC PANEL
ALT: 20 U/L (ref 0–35)
AST: 22 U/L (ref 0–37)
Albumin: 3.6 g/dL (ref 3.5–5.2)
Calcium: 9.7 mg/dL (ref 8.4–10.5)
GFR calc Af Amer: >90 mL/min (ref >90)
Sodium: 139 mEq/L (ref 135–145)
Total Protein: 7.1 g/dL (ref 6.0–8.3)

## 2012-04-24 LAB — POCT I-STAT TROPONIN I: Troponin i, poc: 0.02 ng/mL (ref 0.00–0.08)

## 2012-04-24 NOTE — Discharge Instructions (Signed)
Chest Pain (Nonspecific) It is often hard to give a specific diagnosis for the cause of chest pain. There is always a chance that your pain could be related to something serious, such as a heart attack or a blood clot in the lungs. You need to follow up with your caregiver for further evaluation. CAUSES   Heartburn.  Pneumonia or bronchitis.  Anxiety or stress.  Inflammation around your heart (pericarditis) or lung (pleuritis or pleurisy).  A blood clot in the lung.  A collapsed lung (pneumothorax). It can develop suddenly on its own (spontaneous pneumothorax) or from injury (trauma) to the chest.  Shingles infection (herpes zoster virus). The chest wall is composed of bones, muscles, and cartilage. Any of these can be the source of the pain.  The bones can be bruised by injury.  The muscles or cartilage can be strained by coughing or overwork.  The cartilage can be affected by inflammation and become sore (costochondritis). DIAGNOSIS  Lab tests or other studies, such as X-rays, electrocardiography, stress testing, or cardiac imaging, may be needed to find the cause of your pain.  TREATMENT   Treatment depends on what may be causing your chest pain. Treatment may include:  Acid blockers for heartburn.  Anti-inflammatory medicine.  Pain medicine for inflammatory conditions.  Antibiotics if an infection is present.  You may be advised to change lifestyle habits. This includes stopping smoking and avoiding alcohol, caffeine, and chocolate.  You may be advised to keep your head raised (elevated) when sleeping. This reduces the chance of acid going backward from your stomach into your esophagus.  Most of the time, nonspecific chest pain will improve within 2 to 3 days with rest and mild pain medicine. HOME CARE INSTRUCTIONS   If antibiotics were prescribed, take your antibiotics as directed. Finish them even if you start to feel better.  For the next few days, avoid physical  activities that bring on chest pain. Continue physical activities as directed.  Do not smoke.  Avoid drinking alcohol.  Only take over-the-counter or prescription medicine for pain, discomfort, or fever as directed by your caregiver.  Follow your caregiver's suggestions for further testing if your chest pain does not go away.  Keep any follow-up appointments you made. If you do not go to an appointment, you could develop lasting (chronic) problems with pain. If there is any problem keeping an appointment, you must call to reschedule. SEEK MEDICAL CARE IF:   You think you are having problems from the medicine you are taking. Read your medicine instructions carefully.  Your chest pain does not go away, even after treatment.  You develop a rash with blisters on your chest. SEEK IMMEDIATE MEDICAL CARE IF:   You have increased chest pain or pain that spreads to your arm, neck, jaw, back, or abdomen.  You develop shortness of breath, an increasing cough, or you are coughing up blood.  You have severe back or abdominal pain, feel nauseous, or vomit.  You develop severe weakness, fainting, or chills.  You have a fever. THIS IS AN EMERGENCY. Do not wait to see if the pain will go away. Get medical help at once. Call your local emergency services (911 in [place] Do not drive yourself to the hospital. MAKE SURE YOU:   Understand these instructions.  Will watch your condition.  Will get help right away if you are not doing well or get worse. Document Released: 03/25/2005 Document Revised: 09/07/2011 Document Reviewed: 01/19/2008 Exit[employer] Patient Information 2013 [employer].  

## 2012-04-24 NOTE — ED Provider Notes (Addendum)
History     CSN: [id number]  Arrival date & time 04/23/12  2312   First MD Initiated Contact with Patient 04/23/12 2327      Chief Complaint  Patient presents with  . Chest Pain  . Shortness of Breath    (Consider location/radiation/quality/duration/timing/severity/associated sxs/prior treatment) HPI Comments: Pt with h/o suspected Takosubo syndrome in the past,  and followed by Dr. Royann Shivers, had a NSTEMI MI with normal cath in June 2011 (with my review, saw a max troponin of >5) normally doesn't have GERD or anginal CP, was in bed, getting settled with CPAP machine and not quite asleep, and had sudden chest pressure and SOB similar to her MI in 2011.  She tried antacids and Coke product which didn't help.  Pain did not radiate to jaw or back.  She took 1 SL NTG which resolved pain.  She has a h/o mild asthma as well, no wheezing, but has a chronic cough for a few months.  No fevers, chills.  No pleuritic CP.  No long distance travel, lower leg pain or swelling. No smoking history   Patient is a 66 y.o. female presenting with chest pain and shortness of breath. The history is provided by the patient and the spouse.  Chest Pain Primary symptoms include shortness of breath and cough. Pertinent negatives for primary symptoms include no fever, no palpitations, no nausea and no vomiting.  Pertinent negatives for associated symptoms include no diaphoresis.    Shortness of Breath  Associated symptoms include chest pain, cough and shortness of breath. Pertinent negatives include no fever.    Past Medical History  Diagnosis Date  . Hypothyroidism   . Anemia   . Seasonal allergies   . Depression   . Sleep apnea     no CPAP use - has apnea, wakes up gasping/choking  . CHF (congestive heart failure)   . Takotsubo cardiomyopathy   . Hypertension     under control; has been on med. x 3 yrs.  . Ejection fraction < 50%     38%  . Kidney stone 2002    Past Surgical History  Procedure  Date  . Nasal septum surgery 1980's  . Abdominal hysterectomy 1985    partial  . Cystoscopy w/ ureteral stent placement 08/31/2000    ureteroscopic extraction of stone  . Cardiac catheterization 08/25/2004; 12/09/2009  . Cataract extraction w/ intraocular lens  implant, bilateral   . Knee surgery   . Cartilage repair     r knee    Family History  Problem Relation Age of Onset  . Pneumonia Mother   . Hypertension Mother   . Heart attack Father   . Fibromyalgia Brother   . Pulmonary embolism Brother   . Hypertension Brother     History  Substance Use Topics  . Smoking status: Never Smoker   . Smokeless tobacco: Never Used  . Alcohol Use: No    OB History    Grav Para Term Preterm Abortions TAB SAB Ect Mult Living                  Review of Systems  Constitutional: Negative for fever, chills and diaphoresis.  Respiratory: Positive for cough and shortness of breath.   Cardiovascular: Positive for chest pain. Negative for palpitations and leg swelling.  Gastrointestinal: Negative for nausea and vomiting.  Musculoskeletal: Negative for back pain.  All other systems reviewed and are negative.    Allergies  Codeine  Home Medications  Current Outpatient Rx  Name Route Sig Dispense Refill  . ALBUTEROL SULFATE HFA 108 (90 BASE) MCG/ACT IN AERS Inhalation Inhale 2 puffs into the lungs as needed. Has not needed inhaler in 2 yrs.    . ARIPIPRAZOLE 5 MG PO TABS Oral Take 5 mg by mouth daily.    . ARMODAFINIL 250 MG PO TABS Oral Take 250 mg by mouth daily. AM    . ATORVASTATIN CALCIUM 80 MG PO TABS Oral Take 40 mg by mouth daily. AM    . BUMETANIDE 1 MG PO TABS Oral Take 1 mg by mouth daily. AM    . CALCIUM CARBONATE-VITAMIN D 500-200 MG-UNIT PO TABS Oral Take 1 tablet by mouth daily.    . DULOXETINE HCL 60 MG PO CPEP Oral Take 60 mg by mouth daily. AM    . ESTRADIOL 1 MG PO TABS Oral Take 1 mg by mouth daily. AM    . HYDROCODONE-ACETAMINOPHEN 5-325 MG PO TABS Oral Take 1  tablet by mouth every 6 (six) hours as needed. For pain    . LEVOTHYROXINE SODIUM 50 MCG PO TABS Oral Take 50 mcg by mouth daily. AM    . LOSARTAN POTASSIUM-HCTZ 100-25 MG PO TABS Oral Take 1 tablet by mouth 2 (two) times daily.     Marland Kitchen METOPROLOL SUCCINATE ER 50 MG PO TB24 Oral Take 50 mg by mouth daily. AM    . POTASSIUM CHLORIDE ER 10 MEQ PO TBCR Oral Take 1 tablet (10 mEq total) by mouth daily. 30 tablet 11    BP 107/60  Pulse 60  Temp 97.9 F (36.6 C) (Oral)  Resp 15  Ht 5\' 4"  (1.626 m)  Wt 185 lb (83.915 kg)  BMI 31.76 kg/m2  SpO2 96%  Physical Exam  Nursing note and vitals reviewed. Constitutional: She appears well-developed and well-nourished.  Non-toxic appearance. She does not have a sickly appearance. She does not appear ill.  HENT:  Head: Normocephalic and atraumatic.  Eyes: Pupils are equal, round, and reactive to light. No scleral icterus.  Neck: Normal range of motion. Neck supple.  Cardiovascular: Normal rate and regular rhythm.   Pulmonary/Chest: Effort normal. No respiratory distress. She has no wheezes. She has no rales.  Abdominal: Soft. She exhibits no distension. There is no tenderness.  Musculoskeletal: She exhibits no edema and no tenderness.       Neg Homan's  Neurological: She is alert. She exhibits normal muscle tone. Coordination and gait normal.  Skin: Skin is warm and dry.  Psychiatric: She has a normal mood and affect.    ED Course  Procedures (including critical care time)  Labs Reviewed  CBC - Abnormal; Notable for the following:    MCV 77.3 (*)     MCH 24.5 (*)     All other components within normal limits  COMPREHENSIVE METABOLIC PANEL - Abnormal; Notable for the following:    CO2 34 (*)     Glucose, Bld 161 (*)     Total Bilirubin 0.1 (*)     GFR calc non Af Amer 88 (*)     All other components within normal limits  POCT I-STAT TROPONIN I  POCT I-STAT TROPONIN I   Dg Chest 2 View  04/24/2012  *RADIOLOGY REPORT*  Clinical Data:  Acute onset chest pain and shortness of breath.  CHEST - 2 VIEW  Comparison: 10/21/2011  Findings: Linear fibrosis in the left midlung is stable since previous study. The heart size and pulmonary vascularity are normal. The  lungs appear clear and expanded without focal air space disease or consolidation. No blunting of the costophrenic angles. No pneumothorax.  Mediastinal contours appear intact. Calcification of the aorta.  Degenerative changes in the thoracic spine.  No significant change since previous study.  IMPRESSION: No evidence of active pulmonary disease.   Original Report Authenticated By: Marlon Pel, M.D.      1. Chest pain     Room air saturation is 93% which is low normal by my interpretation.  EKG performed at time 23:17, shows a normal sinus rhythm at a rate of 66, left bundle branch block is present. Otherwise normal intervals. There is no significant change compared to EKG from 06/19/2011.    3:27 AM Second troponin i stat stable at 0.02.  I spoke to [facility] cardiologist on call who would like to see a third at 0400, and if neg, ok to go home and he will let his clininc know that pt needs follow up with Dr. Salena Saner.  Pt remains symptom free.    4:01 AM Pt reports she is very worried about grandkids at home.  She feels well.  She would rather go home.  She will call cardiologist on Monday.  Given cardiology recommends 3 sets, will sign pt out AMA.    MDM     Pt with h/o NSTEMI and cardiomyopathy.  Pt had CP similar to prior NSTEMI.  However after 1 SL NTG, pain free and symptom free.  Will get 2 serial troponins, if stable, pt would rather go home.  Will discuss with cardiologist for recommendations.       Gavin Pound. Oletta Lamas, MD 04/24/12 0401  Gavin Pound. Oletta Lamas, MD 04/24/12 0401  Gavin Pound. Oletta Lamas, MD 04/24/12 0981

## 2012-04-28 DIAGNOSIS — H16259 Phlyctenular keratoconjunctivitis, unspecified eye: Secondary | ICD-10-CM | POA: Diagnosis not present

## 2012-05-02 DIAGNOSIS — I509 Heart failure, unspecified: Secondary | ICD-10-CM | POA: Diagnosis not present

## 2012-05-12 DIAGNOSIS — H16259 Phlyctenular keratoconjunctivitis, unspecified eye: Secondary | ICD-10-CM | POA: Diagnosis not present

## 2012-06-14 DIAGNOSIS — E782 Mixed hyperlipidemia: Secondary | ICD-10-CM | POA: Diagnosis not present

## 2012-06-14 DIAGNOSIS — R0602 Shortness of breath: Secondary | ICD-10-CM | POA: Diagnosis not present

## 2012-06-14 DIAGNOSIS — D649 Anemia, unspecified: Secondary | ICD-10-CM | POA: Diagnosis not present

## 2012-06-14 DIAGNOSIS — E559 Vitamin D deficiency, unspecified: Secondary | ICD-10-CM | POA: Diagnosis not present

## 2012-06-14 DIAGNOSIS — E538 Deficiency of other specified B group vitamins: Secondary | ICD-10-CM | POA: Diagnosis not present

## 2012-06-14 DIAGNOSIS — R7309 Other abnormal glucose: Secondary | ICD-10-CM | POA: Diagnosis not present

## 2012-06-14 DIAGNOSIS — Z1212 Encounter for screening for malignant neoplasm of rectum: Secondary | ICD-10-CM | POA: Diagnosis not present

## 2012-06-14 DIAGNOSIS — I10 Essential (primary) hypertension: Secondary | ICD-10-CM | POA: Diagnosis not present

## 2012-06-14 DIAGNOSIS — Z79899 Other long term (current) drug therapy: Secondary | ICD-10-CM | POA: Diagnosis not present

## 2012-06-16 ENCOUNTER — Other Ambulatory Visit: Payer: Self-pay | Admitting: Internal Medicine

## 2012-06-16 DIAGNOSIS — R188 Other ascites: Secondary | ICD-10-CM

## 2012-06-16 DIAGNOSIS — I10 Essential (primary) hypertension: Secondary | ICD-10-CM | POA: Diagnosis not present

## 2012-06-16 DIAGNOSIS — Z79899 Other long term (current) drug therapy: Secondary | ICD-10-CM | POA: Diagnosis not present

## 2012-06-17 ENCOUNTER — Ambulatory Visit
Admission: RE | Admit: 2012-06-17 | Discharge: 2012-06-17 | Disposition: A | Discharge status: Home / Self Care | Payer: Medicare Other | Source: Ambulatory Visit | Attending: Internal Medicine | Admitting: Internal Medicine

## 2012-06-17 DIAGNOSIS — R188 Other ascites: Secondary | ICD-10-CM

## 2012-06-27 ENCOUNTER — Ambulatory Visit
Admission: RE | Admit: 2012-06-27 | Discharge: 2012-06-27 | Disposition: A | Discharge status: Home / Self Care | Payer: Medicare Other | Source: Ambulatory Visit | Attending: Internal Medicine | Admitting: Internal Medicine

## 2012-06-27 DIAGNOSIS — K8020 Calculus of gallbladder without cholecystitis without obstruction: Secondary | ICD-10-CM | POA: Diagnosis not present

## 2012-06-27 DIAGNOSIS — K7689 Other specified diseases of liver: Secondary | ICD-10-CM | POA: Diagnosis not present

## 2012-07-12 DIAGNOSIS — G4730 Sleep apnea, unspecified: Secondary | ICD-10-CM | POA: Diagnosis not present

## 2012-07-14 DIAGNOSIS — D649 Anemia, unspecified: Secondary | ICD-10-CM | POA: Diagnosis not present

## 2012-07-14 DIAGNOSIS — I10 Essential (primary) hypertension: Secondary | ICD-10-CM | POA: Diagnosis not present

## 2012-08-09 DIAGNOSIS — G4730 Sleep apnea, unspecified: Secondary | ICD-10-CM | POA: Diagnosis not present

## 2012-08-09 DIAGNOSIS — G4710 Hypersomnia, unspecified: Secondary | ICD-10-CM | POA: Diagnosis not present

## 2012-08-17 DIAGNOSIS — E039 Hypothyroidism, unspecified: Secondary | ICD-10-CM | POA: Diagnosis not present

## 2012-08-17 DIAGNOSIS — Z79899 Other long term (current) drug therapy: Secondary | ICD-10-CM | POA: Diagnosis not present

## 2012-08-17 DIAGNOSIS — R0602 Shortness of breath: Secondary | ICD-10-CM | POA: Diagnosis not present

## 2012-08-23 ENCOUNTER — Other Ambulatory Visit: Payer: Self-pay | Admitting: Urology

## 2012-09-12 ENCOUNTER — Emergency Department (HOSPITAL_COMMUNITY)
Admission: EM | Admit: 2012-09-12 | Discharge: 2012-09-12 | Disposition: A | Discharge status: Home / Self Care | Payer: Medicare Other | Attending: Emergency Medicine | Admitting: Emergency Medicine

## 2012-09-12 ENCOUNTER — Encounter (HOSPITAL_COMMUNITY): Payer: Self-pay | Admitting: Emergency Medicine

## 2012-09-12 ENCOUNTER — Emergency Department (HOSPITAL_COMMUNITY): Payer: Medicare Other

## 2012-09-12 ENCOUNTER — Encounter (HOSPITAL_BASED_OUTPATIENT_CLINIC_OR_DEPARTMENT_OTHER): Payer: Self-pay | Admitting: *Deleted

## 2012-09-12 DIAGNOSIS — R5381 Other malaise: Secondary | ICD-10-CM | POA: Diagnosis not present

## 2012-09-12 DIAGNOSIS — I10 Essential (primary) hypertension: Secondary | ICD-10-CM | POA: Diagnosis present

## 2012-09-12 DIAGNOSIS — R479 Unspecified speech disturbances: Secondary | ICD-10-CM

## 2012-09-12 DIAGNOSIS — Z87442 Personal history of urinary calculi: Secondary | ICD-10-CM | POA: Insufficient documentation

## 2012-09-12 DIAGNOSIS — R072 Precordial pain: Secondary | ICD-10-CM | POA: Insufficient documentation

## 2012-09-12 DIAGNOSIS — R002 Palpitations: Secondary | ICD-10-CM | POA: Insufficient documentation

## 2012-09-12 DIAGNOSIS — I252 Old myocardial infarction: Secondary | ICD-10-CM | POA: Insufficient documentation

## 2012-09-12 DIAGNOSIS — Z8679 Personal history of other diseases of the circulatory system: Secondary | ICD-10-CM | POA: Diagnosis not present

## 2012-09-12 DIAGNOSIS — I5181 Takotsubo syndrome: Secondary | ICD-10-CM | POA: Diagnosis not present

## 2012-09-12 DIAGNOSIS — Z9861 Coronary angioplasty status: Secondary | ICD-10-CM | POA: Insufficient documentation

## 2012-09-12 DIAGNOSIS — Z79899 Other long term (current) drug therapy: Secondary | ICD-10-CM | POA: Diagnosis not present

## 2012-09-12 DIAGNOSIS — G4733 Obstructive sleep apnea (adult) (pediatric): Secondary | ICD-10-CM | POA: Insufficient documentation

## 2012-09-12 DIAGNOSIS — Z8709 Personal history of other diseases of the respiratory system: Secondary | ICD-10-CM | POA: Diagnosis not present

## 2012-09-12 DIAGNOSIS — I5042 Chronic combined systolic (congestive) and diastolic (congestive) heart failure: Secondary | ICD-10-CM | POA: Insufficient documentation

## 2012-09-12 DIAGNOSIS — R000 Tachycardia, unspecified: Secondary | ICD-10-CM | POA: Insufficient documentation

## 2012-09-12 DIAGNOSIS — R4789 Other speech disturbances: Secondary | ICD-10-CM | POA: Insufficient documentation

## 2012-09-12 DIAGNOSIS — Z862 Personal history of diseases of the blood and blood-forming organs and certain disorders involving the immune mechanism: Secondary | ICD-10-CM | POA: Insufficient documentation

## 2012-09-12 DIAGNOSIS — I429 Cardiomyopathy, unspecified: Secondary | ICD-10-CM | POA: Diagnosis present

## 2012-09-12 DIAGNOSIS — E039 Hypothyroidism, unspecified: Secondary | ICD-10-CM | POA: Diagnosis not present

## 2012-09-12 DIAGNOSIS — Z87448 Personal history of other diseases of urinary system: Secondary | ICD-10-CM | POA: Insufficient documentation

## 2012-09-12 DIAGNOSIS — R079 Chest pain, unspecified: Secondary | ICD-10-CM

## 2012-09-12 DIAGNOSIS — J45909 Unspecified asthma, uncomplicated: Secondary | ICD-10-CM | POA: Diagnosis not present

## 2012-09-12 DIAGNOSIS — F411 Generalized anxiety disorder: Secondary | ICD-10-CM | POA: Insufficient documentation

## 2012-09-12 DIAGNOSIS — F419 Anxiety disorder, unspecified: Secondary | ICD-10-CM | POA: Diagnosis present

## 2012-09-12 DIAGNOSIS — Z9119 Patient's noncompliance with other medical treatment and regimen: Secondary | ICD-10-CM | POA: Diagnosis not present

## 2012-09-12 DIAGNOSIS — E669 Obesity, unspecified: Secondary | ICD-10-CM | POA: Diagnosis present

## 2012-09-12 DIAGNOSIS — I428 Other cardiomyopathies: Secondary | ICD-10-CM | POA: Diagnosis present

## 2012-09-12 DIAGNOSIS — I447 Left bundle-branch block, unspecified: Secondary | ICD-10-CM | POA: Diagnosis present

## 2012-09-12 DIAGNOSIS — F329 Major depressive disorder, single episode, unspecified: Secondary | ICD-10-CM | POA: Diagnosis not present

## 2012-09-12 DIAGNOSIS — F3289 Other specified depressive episodes: Secondary | ICD-10-CM | POA: Insufficient documentation

## 2012-09-12 DIAGNOSIS — Z91199 Patient's noncompliance with other medical treatment and regimen due to unspecified reason: Secondary | ICD-10-CM | POA: Insufficient documentation

## 2012-09-12 DIAGNOSIS — G4730 Sleep apnea, unspecified: Secondary | ICD-10-CM | POA: Insufficient documentation

## 2012-09-12 LAB — COMPREHENSIVE METABOLIC PANEL
AST: 25 U/L (ref 0–37)
Albumin: 4 g/dL (ref 3.5–5.2)
Chloride: 97 mEq/L (ref 96–112)
Creatinine, Ser: 0.85 mg/dL (ref 0.50–1.10)
Potassium: 2.9 mEq/L — ABNORMAL LOW (ref 3.5–5.1)
Sodium: 136 mEq/L (ref 135–145)
Total Bilirubin: 0.4 mg/dL (ref 0.3–1.2)

## 2012-09-12 LAB — CBC WITH DIFFERENTIAL/PLATELET
Basophils Absolute: 0.1 K/uL (ref 0.0–0.1)
Basophils Relative: 1 % (ref 0–1)
Eosinophils Absolute: 0.1 10*3/uL (ref 0.0–0.7)
Eosinophils Relative: 1 % (ref 0–5)
HCT: 40.9 % (ref 36.0–46.0)
Hemoglobin: 14.1 g/dL (ref 12.0–15.0)
Lymphocytes Relative: 23 % (ref 12–46)
Lymphs Abs: 2.3 10*3/uL (ref 0.7–4.0)
MCH: 25.9 pg — ABNORMAL LOW (ref 26.0–34.0)
MCHC: 34.5 g/dL (ref 30.0–36.0)
MCV: 75.2 fL — ABNORMAL LOW (ref 78.0–100.0)
Monocytes Absolute: 0.9 K/uL (ref 0.1–1.0)
Monocytes Relative: 9 % (ref 3–12)
Neutro Abs: 6.6 K/uL (ref 1.7–7.7)
Neutrophils Relative %: 66 % (ref 43–77)
Platelets: 259 10*3/uL (ref 150–400)
RBC: 5.44 MIL/uL — ABNORMAL HIGH (ref 3.87–5.11)
RDW: 14.4 % (ref 11.5–15.5)
WBC: 10 K/uL (ref 4.0–10.5)

## 2012-09-12 LAB — COMPREHENSIVE METABOLIC PANEL WITH GFR
ALT: 23 U/L (ref 0–35)
Alkaline Phosphatase: 64 U/L (ref 39–117)
BUN: 15 mg/dL (ref 6–23)
CO2: 26 meq/L (ref 19–32)
Calcium: 9.8 mg/dL (ref 8.4–10.5)
GFR calc Af Amer: 80 mL/min — ABNORMAL LOW (ref >90)
GFR calc non Af Amer: 69 mL/min — ABNORMAL LOW (ref >90)
Glucose, Bld: 108 mg/dL — ABNORMAL HIGH (ref 70–99)
Total Protein: 7.5 g/dL (ref 6.0–8.3)

## 2012-09-12 LAB — URINALYSIS, ROUTINE W REFLEX MICROSCOPIC
Hgb urine dipstick: NEGATIVE
Nitrite: NEGATIVE
Specific Gravity, Urine: 1.017 (ref 1.005–1.030)
Urobilinogen, UA: 1 mg/dL (ref 0.0–1.0)

## 2012-09-12 LAB — TROPONIN I: Troponin I: <0.3 ng/mL (ref <0.30)

## 2012-09-12 LAB — PRO B NATRIURETIC PEPTIDE: Pro B Natriuretic peptide (BNP): 69.2 pg/mL (ref 0–125)

## 2012-09-12 LAB — POCT I-STAT TROPONIN I: Troponin i, poc: 0 ng/mL (ref 0.00–0.08)

## 2012-09-12 MED ORDER — NITROGLYCERIN 0.4 MG SL SUBL: 0.4000 mg | SUBLINGUAL_TABLET | SUBLINGUAL | Status: DC | PRN

## 2012-09-12 MED ORDER — LORAZEPAM 2 MG/ML IJ SOLN
1.0000 mg | Freq: Once | INTRAMUSCULAR | Status: AC
  Administered 2012-09-12: 1 mg via INTRAVENOUS
  Filled 2012-09-12: qty 1

## 2012-09-12 MED ORDER — ASPIRIN EC 325 MG PO TBEC
325.0000 mg | DELAYED_RELEASE_TABLET | Freq: Once | ORAL | Status: AC
  Administered 2012-09-12: 325 mg via ORAL
  Filled 2012-09-12: qty 1

## 2012-09-12 MED ORDER — POTASSIUM CHLORIDE CRYS ER 20 MEQ PO TBCR
40.0000 meq | EXTENDED_RELEASE_TABLET | Freq: Once | ORAL | Status: AC
  Administered 2012-09-12: 40 meq via ORAL
  Filled 2012-09-12: qty 2

## 2012-09-12 NOTE — Consult Note (Signed)
Reason for Consult: SOB  Requesting Physician: ER/ family  HPI: This is a 67 y.o. female with a past medical history significant for Takotsubo cardiomyopathy with an EF of 38% by echo. She has had two prior caths showing no significant epicardial CAD, in 2006 and again 6/11. During her 6/11/ admission she had a Troponin of 3.0 and a new EF of 38%. She also has significant anxiety disorder and has required hospitalizations for this in the past. Today she had some SOB at her psychiatrist office and then began speaking gibberish. She is seen in the ER now at the request of the family. There is no history of chest pain. Labs, and  CXR pending but she does not appear to be in CHF on exam. Her EKG shows sinus tach with LBBB (old). She continues to speak gibberish so history is from her daughter.  PMHx:  Past Medical History  Diagnosis Date  . Hypothyroidism   . Anemia   . Seasonal allergies   . Depression   . SUI (stress urinary incontinence, female)   . OSA (obstructive sleep apnea) MODERATE PER STUDY 2005    CPAP NONCOMPLIANT  . History of kidney stones   . Nonischemic dilated cardiomyopathy     MODERATELY DEPRESSED LVF  . LBBB (left bundle branch block)   . Left ventricular ejection fraction less than 40%     38% PER CARDIOLOGIST NOTE (DR CROITORU)  . Short of breath on exertion   . Chronic combined systolic and diastolic CHF, NYHA class 2 CARDIOLOGIST-  DR ZOXWRUEA  . Hypertension   . History of non-ST elevation myocardial infarction (NSTEMI) JUNE 2011    SECONDARY TO TAKOTSUDO SYNDROME (CARDIAC CATH NORMAL)   Past Surgical History  Procedure Laterality Date  . Nasal septum surgery  1980's  . Abdominal hysterectomy  1985    partial  . Cataract extraction w/ intraocular lens  implant, bilateral    . Right ureteroscopic stone extraction  08-31-2000  . Knee arthroscopy w/ meniscectomy  07-27-2011    MEDIAL AND LATERAL  . Transthoracic echocardiogram  05-27-2011  DR CROITORU   MODERATELY DEPRESSED LVF DUE TO GLOBAL HYPOKINESIS AND MARKED SYSTOLIC ASYNCHRONY/ EF 38%/ MILD LEFT ATRIAL DILATATION  . Cardiac catheterization  09-04-1999;  08/25/2004;   12/09/2009  DR CROITORU    NORMAL CORONARIES/  APICAL BALLOONING OF LV CONSISTENT WITH TAKOTSUBO SYMPTOMS/ EF 30-35%    FAMHx: Family History  Problem Relation Age of Onset  . Pneumonia Mother   . Hypertension Mother   . Heart attack Father   . Fibromyalgia Brother   . Pulmonary embolism Brother   . Hypertension Brother     SOCHx:  reports that she has never smoked. She has never used smokeless tobacco. She reports that she does not drink alcohol or use illicit drugs.  ALLERGIES: Allergies  Allergen Reactions  . Lasix (Furosemide) Other (See Comments)    HEADACHE  . Codeine Hives and Other (See Comments)    Headache     ROS: Review of systems not obtained due to patient factors.  HOME MEDICATIONS:  (Not in a hospital admission)  HOSPITAL MEDICATIONS: I have reviewed the patient's current medications.  VITALS: Blood pressure 115/68, pulse 124, temperature 98.1 F (36.7 C), temperature source Oral, resp. rate 26, SpO2 100.00%.  PHYSICAL EXAM: General appearance: alert, cooperative, no distress and moderately obese Neck: no carotid bruit and no JVD Lungs: clear to auscultation bilaterally Heart: regular rate and rhythm Abdomen: obese Extremities: no edema Pulses:  2+ and symmetric Skin: Skin color, texture, turgor normal. No rashes or lesions or pale Neurologic: awake and alert, calm  LABS: Results for orders placed during the hospital encounter of 09/12/12 (from the past 48 hour(s))  CBC WITH DIFFERENTIAL     Status: Abnormal   Collection Time    09/12/12  3:36 PM      Result Value Range   WBC 10.0  4.0 - 10.5 K/uL   RBC 5.44 (*) 3.87 - 5.11 MIL/uL   Hemoglobin 14.1  12.0 - 15.0 g/dL   HCT 62.1  30.8 - 65.7 %   MCV 75.2 (*) 78.0 - 100.0 fL   MCH 25.9 (*) 26.0 - 34.0 pg   MCHC 34.5  30.0  - 36.0 g/dL   RDW 84.6  96.2 - 95.2 %   Platelets 259  150 - 400 K/uL   Neutrophils Relative 66  43 - 77 %   Neutro Abs 6.6  1.7 - 7.7 K/uL   Lymphocytes Relative 23  12 - 46 %   Lymphs Abs 2.3  0.7 - 4.0 K/uL   Monocytes Relative 9  3 - 12 %   Monocytes Absolute 0.9  0.1 - 1.0 K/uL   Eosinophils Relative 1  0 - 5 %   Eosinophils Absolute 0.1  0.0 - 0.7 K/uL   Basophils Relative 1  0 - 1 %   Basophils Absolute 0.1  0.0 - 0.1 K/uL  POCT I-STAT TROPONIN I     Status: None   Collection Time    09/12/12  3:53 PM      Result Value Range   Troponin i, poc 0.00  0.00 - 0.08 ng/mL   Comment 3            Comment: Due to the release kinetics of cTnI,     a negative result within the first hours     of the onset of symptoms does not rule out     myocardial infarction with certainty.     If myocardial infarction is still suspected,     repeat the test at appropriate intervals.    IMAGING: No results found.  IMPRESSION: Principal Problem:   Dyspnea- probably related to acute psychosis Active Problems:   Anxiety disorder with acute ? medication reaction   Takotsubo syndrome, June 2011.   Cardiomyopathy- EF 38%   HTN (hypertension)   Dyslipidemia   LBBB (left bundle branch block)   Obesity   RECOMMENDATION: Check Troponin, check CXR for CHF. I will notify Dr Royann Shivers. We will be available if needed.  Time Spent Directly with Patient: 45 minutes  Falicia Lizotte K 09/12/2012, 4:23 PM

## 2012-09-12 NOTE — ED Notes (Signed)
Pt ambulated to the bathroom without any assistances. Sister did stay in the bathroom with the patient.

## 2012-09-12 NOTE — ED Notes (Signed)
Pt followed commands such as standing up to get undressed, she undressed herself, somewhat unsteady while standing. Sister is at the bedside. Pt has calmed down since arrival.

## 2012-09-12 NOTE — ED Provider Notes (Signed)
History     CSN: [id number]  Arrival date & time 09/12/12  1428   First MD Initiated Contact with Patient 09/12/12 1516      No chief complaint on file.   (Consider location/radiation/quality/duration/timing/severity/associated sxs/prior treatment) HPI Comments: 67 yo female presenting from her psychiatrist's office secondary to chest pain.  At approximately the time she arrived in the ED, she began "talking gibberish" according to her sister.  Pt does not give any history herself.  Sister and Husband note that she presented similarly when she was told that she had Takotsubo Cardiomyopathy.    Her sister provides most of her history, noting that the patient was feeling poorly this weekend, complaining mostly of fatigue and palpitations.  She did not develop chest pain until she arrived at her psychiatrist's office.    History significantly limited by patient's condition.   Patient is a 67 y.o. female presenting with chest pain. The history is provided by the spouse and a relative. The history is limited by the condition of the patient.  Chest Pain Pain location:  Substernal area Pain quality: pressure   Pain radiates to:  Does not radiate Pain severity:  Severe Onset quality:  Sudden Timing:  Intermittent   Past Medical History  Diagnosis Date  . Hypothyroidism   . Anemia   . Seasonal allergies   . Depression   . SUI (stress urinary incontinence, female)   . OSA (obstructive sleep apnea) MODERATE PER STUDY 2005    CPAP NONCOMPLIANT  . History of kidney stones   . Nonischemic dilated cardiomyopathy     MODERATELY DEPRESSED LVF  . LBBB (left bundle branch block)   . Left ventricular ejection fraction less than 40%     38% PER CARDIOLOGIST NOTE (DR CROITORU)  . Short of breath on exertion   . Chronic combined systolic and diastolic CHF, NYHA class 2 CARDIOLOGIST-  DR [id number]  . Hypertension   . History of non-ST elevation myocardial infarction (NSTEMI) JUNE 2011   SECONDARY TO TAKOTSUDO SYNDROME (CARDIAC CATH NORMAL)    Past Surgical History  Procedure Laterality Date  . Nasal septum surgery  1980's  . Abdominal hysterectomy  1985    partial  . Cataract extraction w/ intraocular lens  implant, bilateral    . Right ureteroscopic stone extraction  08-31-2000  . Knee arthroscopy w/ meniscectomy  07-27-2011    MEDIAL AND LATERAL  . Transthoracic echocardiogram  05-27-2011  DR CROITORU    MODERATELY DEPRESSED LVF DUE TO GLOBAL HYPOKINESIS AND MARKED SYSTOLIC ASYNCHRONY/ EF 38%/ MILD LEFT ATRIAL DILATATION  . Cardiac catheterization  09-04-1999;  08/25/2004;   12/09/2009  DR CROITORU    NORMAL CORONARIES/  APICAL BALLOONING OF LV CONSISTENT WITH TAKOTSUBO SYMPTOMS/ EF 30-35%    Family History  Problem Relation Age of Onset  . Pneumonia Mother   . Hypertension Mother   . Heart attack Father   . Fibromyalgia Brother   . Pulmonary embolism Brother   . Hypertension Brother     History  Substance Use Topics  . Smoking status: Never Smoker   . Smokeless tobacco: Never Used  . Alcohol Use: No    OB History   Grav Para Term Preterm Abortions TAB SAB Ect Mult Living                  Review of Systems  Unable to perform ROS: Patient nonverbal  Cardiovascular: Positive for chest pain.    Allergies  Lasix and Codeine  Home  Medications   Current Outpatient Rx  Name  Route  Sig  Dispense  Refill  . albuterol (PROVENTIL HFA;VENTOLIN HFA) 108 (90 BASE) MCG/ACT inhaler   Inhalation   Inhale 2 puffs into the lungs as needed. Has not needed inhaler in 2 yrs.         . Armodafinil (NUVIGIL) 250 MG tablet   Oral   Take 250 mg by mouth daily. AM         . atorvastatin (LIPITOR) 80 MG tablet   Oral   Take 40 mg by mouth daily. AM         . bumetanide (BUMEX) 1 MG tablet   Oral   Take 1 mg by mouth daily. AM         . calcium-vitamin D (OSCAL WITH D) 500-200 MG-UNIT per tablet   Oral   Take 1 tablet by mouth daily.         .  DULoxetine (CYMBALTA) 60 MG capsule   Oral   Take 60 mg by mouth daily. AM         . estradiol (ESTRACE) 1 MG tablet   Oral   Take 1 mg by mouth daily. AM         . HYDROcodone-acetaminophen (NORCO) 5-325 MG per tablet   Oral   Take 1 tablet by mouth every 6 (six) hours as needed. For pain         . levothyroxine (SYNTHROID, LEVOTHROID) 50 MCG tablet   Oral   Take 50 mcg by mouth daily. AM         . losartan-hydrochlorothiazide (HYZAAR) 100-25 MG per tablet   Oral   Take 1 tablet by mouth 2 (two) times daily.          . metoprolol (TOPROL-XL) 50 MG 24 hr tablet   Oral   Take 50 mg by mouth daily. AM         . EXPIRED: potassium chloride (K-DUR) 10 MEQ tablet   Oral   Take 1 tablet (10 mEq total) by mouth daily.   30 tablet   11     BP 115/68  Pulse 124  Temp(Src) 98.1 F (36.7 C) (Oral)  Resp 26  SpO2 100%  Physical Exam  Nursing note and vitals reviewed. Constitutional: She is oriented to person, place, and time. She appears well-developed and well-nourished. No distress.  HENT:  Head: Normocephalic and atraumatic.  Mouth/Throat: Oropharynx is clear and moist.  Eyes: Conjunctivae are normal. Pupils are equal, round, and reactive to light. No scleral icterus.  Neck: Neck supple.  Cardiovascular: Regular rhythm, normal heart sounds and intact distal pulses.  Tachycardia present.   No murmur heard. Pulmonary/Chest: Effort normal and breath sounds normal. No stridor. No respiratory distress. She has no rales.  Abdominal: Soft. Bowel sounds are normal. She exhibits no distension. There is no tenderness. There is no rebound and no guarding.  Musculoskeletal: Normal range of motion. She exhibits no edema.  Neurological: She is alert and oriented to person, place, and time.  Skin: Skin is warm and dry. No rash noted.  Psychiatric: She has a normal mood and affect. Her behavior is normal.    ED Course  Procedures (including critical care time)  Labs  Reviewed  COMPREHENSIVE METABOLIC PANEL - Abnormal; Notable for the following:    Potassium 2.9 (*)    Glucose, Bld 108 (*)    GFR calc non Af Amer 69 (*)    GFR calc Af Denyse Dago  80 (*)    All other components within normal limits  CBC WITH DIFFERENTIAL - Abnormal; Notable for the following:    RBC 5.44 (*)    MCV 75.2 (*)    MCH 25.9 (*)    All other components within normal limits  URINALYSIS, ROUTINE W REFLEX MICROSCOPIC - Abnormal; Notable for the following:    Ketones, ur 15 (*)    All other components within normal limits  TROPONIN I  PRO B NATRIURETIC PEPTIDE  TROPONIN I  POCT I-STAT TROPONIN I   Ct Head Wo Contrast  09/12/2012  *RADIOLOGY REPORT*  Clinical Data: Headache.  Abnormal speech  CT HEAD WITHOUT CONTRAST  Technique:  Contiguous axial images were obtained from the base of the skull through the vertex without contrast.  Comparison: 12/07/2009  Findings: Ventricle size is normal.  Negative for acute infarct. Negative for hemorrhage or mass.  Calvarium is intact.  IMPRESSION: No acute abnormality.   Original Report Authenticated By: Janeece Riggers, M.D.    Dg Chest Portable 1 View  09/12/2012  *RADIOLOGY REPORT*  Clinical Data: Chest pain  PORTABLE CHEST - 1 VIEW  Comparison: 04/24/2012  Findings: Normal heart size and vascularity.  Chronic left midlung peripheral scarring.  Negative for CHF, pneumonia, collapse, consolidation, effusion or pneumothorax.  Trachea midline.  IMPRESSION: Stable exam.  No superimposed acute process   Original Report Authenticated By: Judie Petit. Miles Costain, M.D.   All radiology studies independently viewed by me.     EKG - Sinus tachy, rate 116, LBBB, no sgarbossa criteria, axis deviated leftward compared to prior.    1. Chest pain   2. Speech problem       MDM  67 yo female with hx of nonischemic cardiomyopathy presenting with chest pain and speech changes.  Her speech changes are felt to reflect an anxiety reaction and not a CVA.  (described by family as  talking gibberish, gradual onset, very atypical presentation).  History is significantly limited because of this.  But plan to check head CT and perform cardiac workup.  3:52 PM sister reports "she can understand [place] when we ask her questions.  I just asked her if she could take aspirin and she said 'yes'."    11:03 PM Pt's speech difficulty completely resolved after several hours in the ED.  Head CT negative.  Delta troponin better.  Pt said CP completely resolved after eating dinner.  Possible GI component.  Her Cardiologist evaluated her in the ED and felt that a negative delta troponin was sufficient to rule out ACS.  Discussed her medication changes with Pharm D, who noted no known medication effect to explain her symptoms, but did suggest that her Burr Medico might be the cause of her persistent (although improved) tachycardia.  Pt completely asymptomatic and tolerating PO's.  DC'd home with PCP and Cardiology followup.        Rennis Petty, MD 09/12/12 2308  Rennis Petty, MD 09/12/12 [phone]

## 2012-09-12 NOTE — ED Provider Notes (Signed)
I saw and evaluated the patient, reviewed the resident's note and I agree with the findings and plan.  I reviewed and agree with ECG interpretation by Dr. Loretha Stapler.   Pt with anxiety, not speaking, prior h/o same, otherwise no weakness, no facial droop.  Symptoms resolved of CP, not speaking after time and IV ativan, IVF's.  Work up for cardiac neg, spoke to her cardiologist team who report due to recent cath that was normal and 2 serial neg troponins, non cardiac chest pain was their assessment.  I spoke to pharmacy who does not feel that her new medication is interacting with any others to cause serotonin or other significant side effect.  Will discharge to home with family and encourage close follow up with her psychiatrist.    Gavin Pound. Oletta Lamas, MD 09/12/12 2318

## 2012-09-12 NOTE — Consult Note (Signed)
I have seen and examined the patient along with Abelino Derrick, PA.  I have reviewed the chart, notes and new data.  I agree with NP's note.  Key new complaints: No complaints of either angina or dyspnea Key examination changes: No evidence of jugular venous distention pulmonary rales or gallop  Key new findings / data: ECG shows a chronic left bundle branch block; the cardiac enzymes are normal and BNP is very low; chest x-ray does not show evidence of congestive heart failure  PLAN: There is no evidence of any acute worsening of her chronic cardiac condition. I'm not sure if her current disorder is neurological or psychiatric. She is going to have a CT of her head shortly. At this point in time there appears to be no reason to change the management of a cardiac disorder. Diuretics should be held until her severe hypokalemia is corrected.  Thurmon Fair, MD, [facility] [phone] 09/12/2012, 6:05 PM

## 2012-09-12 NOTE — ED Notes (Signed)
Sister (darlene) at bedside. Husband on the way (richard)

## 2012-09-12 NOTE — ED Notes (Signed)
Pt presents w/ ems from a dr( psychologist) office for ? Reaction to med Burr Medico that she started on 3/5. Then pt had sudden onset of cp at the dr office took 1 nitro on her. Then she got anxious and tearful  On upon arrival to er pt starting w/gibberish lauguage, sister is w/ pt and sister states she has done this one time in past. Sister Rosalyn Charters states that pt has been out of nuvigil ? X 3-4 days that med is for narcolepsy

## 2012-09-12 NOTE — ED Notes (Signed)
On Friday she was having chills w/ hot and cold flashes it cont and went to see dr Oneta Rack today and then was sent to her pschy dr Evelene Croon) and then was sent to er

## 2012-09-13 ENCOUNTER — Other Ambulatory Visit (HOSPITAL_COMMUNITY): Payer: Self-pay | Admitting: Cardiology

## 2012-09-13 DIAGNOSIS — I5181 Takotsubo syndrome: Secondary | ICD-10-CM

## 2012-09-14 ENCOUNTER — Encounter: Payer: Self-pay | Admitting: *Deleted

## 2012-09-16 ENCOUNTER — Encounter (HOSPITAL_BASED_OUTPATIENT_CLINIC_OR_DEPARTMENT_OTHER): Payer: Self-pay | Admitting: *Deleted

## 2012-09-16 NOTE — Progress Notes (Signed)
Reviewed chart with Dr Inda Merlin to proceed at [facility] arrive at 0730-Istat on arrival-Ekg,Cxr,clearance with chart.Npo after Mn-instructed to take metoprolol,losartan,levothyroxine,valium with small amt water that am-to bring,inhaler ,cpap and mask.Guidelines for RCC reviewed.

## 2012-09-16 NOTE — H&P (Signed)
Patient presents today for elective sub-urethral sling for straightforward stress urinary incontinence.   Reason For Visit  Laura [name] presents today to discuss her recent video urodynamics. She presented with mixed urinary incontinence. There were no obstructive symptoms. On her urodynamic she was initially found to have a minimal postvoid residual. Bladder sensation was hypersensitive and she really did not develop much of a sensation of void until past 400 mL. Her bladder appeared completely stable and there is no evidence of uninhibited bladder contractions. The patient was documented to have stress urinary incontinence. There was evidence of urethral hypermobility. Her leak point pressures were just around 100 cm of water pressure over various fill levels.   History of Present Illness     Laura [name] presented recently to reestablish as a new patient.  In the past, she was seen by Dr. Elige Radon and I did see her on one occasion in 2005.  Her urologic problems in the past have really been related to nephrolithiasis and she has required at least1 ureteroscopic procedure.  She comes in today with a 1+ year history of mixed urinary incontinence.  She clearly has what sounds like moderate to severe stress incontinence, but also seems to have a component of urge leakage as well.  She does wear multiple pads on a regular basis and it is not usual for her to soak herself on occasion.  This usually involves lifting something heavy.  She does not have any pain with voiding and has no obstructive symptoms.  She has little in the way of nocturia, but does have nighttime incontinence as well.  She has had a hysterectomy in the past.  Urinalysis today shows nothing of concern.    Past Medical History Problems  1. History of  Acute Myocardial Infarction V12.59 2. History of  Anxiety (Symptom) 300.00 3. History of  Asthma 493.90 4. History of  Atrial Fibrillation 427.31 5. History of  Congestive Heart Failure  428.0 6. History of  Depression 311 7. History of  Heart Disease 429.9 8. History of  Hypercholesterolemia 272.0 9. History of  Hypertension 401.9 10. History of  Hypothyroidism 244.9 11. History of  Sleep Apnea 780.57  Surgical History Problems  1. History of  Hysterectomy V45.77 2. History of  Knee Surgery 3. History of  Nasal Septal Deviation Repair  Current Meds 1. Abilify 5 MG Oral Tablet; Therapy: 29Oct2013 to 2. Atorvastatin Calcium 80 MG Oral Tablet; Therapy: 07Oct2013 to 3. Bumetanide 1 MG Oral Tablet; Therapy: 29Jul2013 to 4. Calcium TABS; Therapy: (Recorded:28Jan2014) to 5. Cymbalta 60 MG Oral Capsule Delayed Release Particles; Therapy: 21Oct2013 to 6. Estradiol 1 MG Oral Tablet; Therapy: 29Sep2013 to 7. Levothyroxine Sodium 50 MCG Oral Tablet; Therapy: 06Oct2013 to 8. Losartan Potassium-HCTZ 100-25 MG Oral Tablet; Therapy: 09Dec2013 to 9. Metoprolol Succinate ER 50 MG Oral Tablet Extended Release 24 Hour; Therapy: 27Aug2013 to 10. Nitrostat 0.4 MG Sublingual Tablet Sublingual; Therapy: 27Oct2013 to 11. Nuvigil 250 MG Oral Tablet; Therapy: 05Nov2013 to 12. Potassium Chloride Crys ER 10 MEQ Oral Tablet Extended Release; Therapy: 10Oct2013 to 13. Spironolactone 25 MG Oral Tablet; Therapy: 11Jan2014 to 14. Toviaz 4 MG Oral Tablet Extended Release 24 Hour; Therapy: 28Oct2013 to 15. Vitamin D3 1000 UNIT Oral Capsule; Therapy: (Recorded:28Jan2014) to  Allergies Medication  1. Codeine Derivatives  Family History Problems  1. Paternal history of  Acute Myocardial Infarction V17.3 2. Paternal history of  Death In The Family Father 37yrs, MI 3. Maternal history of  Death In The Family Mother 22yrs  4. Family history of  Family Health Status Number Of Children 2 daughters 5. Paternal history of  Heart Disease V17.49 6. Paternal history of  Nephrolithiasis  Social History Problems  1. Caffeine Use 2 qd 2. Marital History - Currently Married 3. Never A Smoker 4.  Occupation: retired Nurse, children's  5. History of  Alcohol Use 6. History of  Tobacco Use  Review of Systems Genitourinary, constitutional, skin, eye, otolaryngeal, hematologic/lymphatic, cardiovascular, pulmonary, endocrine, musculoskeletal, gastrointestinal, neurological and psychiatric system(s) were reviewed and pertinent findings if present are noted.  Genitourinary: urinary frequency, urinary urgency, nocturia and incontinence, but no dysuria, no urinary hesitancy, no hematuria and no pelvic pain.  Gastrointestinal: heartburn and constipation.  Constitutional: feeling tired (fatigue).  Integumentary: pruritus.  Eyes: blurred vision.  Hematologic/Lymphatic: a tendency to easily bruise.  Cardiovascular: chest pain and leg swelling.  Respiratory: shortness of breath and cough.  Endocrine: polydipsia.  Musculoskeletal: back pain and joint pain.  Neurological: dizziness.  Psychiatric: depression and anxiety.    Vitals Vital Signs [Data Includes: Last 1 Day]  24Feb2014 02:46PM  Blood Pressure: 127 / 81 Temperature: 98.6 F Heart Rate: 84  Physical Exam Constitutional: Well nourished and well developed . No acute distress.  ENT:. The ears and nose are normal in appearance.  Neck: The appearance of the neck is normal and no neck mass is present.  Cardiovascular: Heart rate and rhythm are normal . No peripheral edema.  Abdomen: The abdomen is obese. The abdomen is soft and nontender. No masses are palpated. No CVA tenderness. No hernias are palpable. No hepatosplenomegaly noted.  Genitourinary:. [name].  Chaperone Present: brandy. Urethral hypermobility is present. Vaginal exam demonstrates atrophy.  Skin: Normal skin turgor, no visible rash and no visible skin lesions.  Neuro/Psych:. Mood and affect are appropriate.   Assessment Assessed  1. Urge And Stress Incontinence 788.33  Plan Urge And Stress Incontinence (788.33)  1. Follow-up Schedule Surgery Office  Follow-up  Requested  for: 24Feb2014  Discussion/Summary   Evelena has really had some longstanding stress incontinence that really has worsened over the last year. This does seem to be greatly affecting her quality of life. Urodynamics really suggest that she primarily has urethral hypomobility with primary stress incontinence. She does have some urinary frequency during the day, although I suspect that is more due to diuretic use than anything else. She clearly has significant stress incontinence on the urodynamics without any overt evidence of bladder instability. She is unlikely to benefit from any conservative therapy and medication is not going to be helpful in this case. She is a good candidate for suburethral sling. I did explain that procedure to her. We went over the success rates, potential complications. We talked about the issues with regard to mesh. I would keep her overnight for the procedure. She is interested in proceeding. She does have a history of apparently a cardiomyopathy with a reduced ejection fraction and it would be reasonable to make sure that [facility] does not feel she needs any additional testing prior to this procedure. Again, I would anticipate about 30-40 minutes of anesthesia time with no significant fluid shifts or significant blood loss on most situations. Again, she is interested in proceeding and we will get this scheduled for sometime in the next several weeks.

## 2012-09-19 ENCOUNTER — Encounter (HOSPITAL_BASED_OUTPATIENT_CLINIC_OR_DEPARTMENT_OTHER): Payer: Self-pay | Admitting: Anesthesiology

## 2012-09-19 ENCOUNTER — Encounter (HOSPITAL_BASED_OUTPATIENT_CLINIC_OR_DEPARTMENT_OTHER)
Admission: RE | Disposition: A | Discharge status: Home / Self Care | Payer: Self-pay | Source: Ambulatory Visit | Attending: Urology

## 2012-09-19 ENCOUNTER — Ambulatory Visit (HOSPITAL_BASED_OUTPATIENT_CLINIC_OR_DEPARTMENT_OTHER): Payer: Medicare Other | Admitting: Anesthesiology

## 2012-09-19 ENCOUNTER — Encounter (HOSPITAL_BASED_OUTPATIENT_CLINIC_OR_DEPARTMENT_OTHER): Payer: Self-pay

## 2012-09-19 ENCOUNTER — Ambulatory Visit (HOSPITAL_BASED_OUTPATIENT_CLINIC_OR_DEPARTMENT_OTHER)
Admission: RE | Admit: 2012-09-19 | Discharge: 2012-09-20 | Disposition: A | Discharge status: Home / Self Care | Payer: Medicare Other | Source: Ambulatory Visit | Attending: Urology | Admitting: Urology

## 2012-09-19 DIAGNOSIS — E039 Hypothyroidism, unspecified: Secondary | ICD-10-CM | POA: Insufficient documentation

## 2012-09-19 DIAGNOSIS — Z90710 Acquired absence of both cervix and uterus: Secondary | ICD-10-CM | POA: Diagnosis not present

## 2012-09-19 DIAGNOSIS — F3289 Other specified depressive episodes: Secondary | ICD-10-CM | POA: Insufficient documentation

## 2012-09-19 DIAGNOSIS — N393 Stress incontinence (female) (male): Secondary | ICD-10-CM | POA: Diagnosis not present

## 2012-09-19 DIAGNOSIS — I428 Other cardiomyopathies: Secondary | ICD-10-CM | POA: Diagnosis not present

## 2012-09-19 DIAGNOSIS — Z79899 Other long term (current) drug therapy: Secondary | ICD-10-CM | POA: Diagnosis not present

## 2012-09-19 DIAGNOSIS — I10 Essential (primary) hypertension: Secondary | ICD-10-CM | POA: Insufficient documentation

## 2012-09-19 DIAGNOSIS — E7800 Pure hypercholesterolemia, unspecified: Secondary | ICD-10-CM | POA: Diagnosis not present

## 2012-09-19 DIAGNOSIS — I4891 Unspecified atrial fibrillation: Secondary | ICD-10-CM | POA: Insufficient documentation

## 2012-09-19 DIAGNOSIS — G4730 Sleep apnea, unspecified: Secondary | ICD-10-CM | POA: Insufficient documentation

## 2012-09-19 DIAGNOSIS — I509 Heart failure, unspecified: Secondary | ICD-10-CM | POA: Diagnosis not present

## 2012-09-19 DIAGNOSIS — F329 Major depressive disorder, single episode, unspecified: Secondary | ICD-10-CM | POA: Insufficient documentation

## 2012-09-19 DIAGNOSIS — I252 Old myocardial infarction: Secondary | ICD-10-CM | POA: Insufficient documentation

## 2012-09-19 DIAGNOSIS — N3946 Mixed incontinence: Secondary | ICD-10-CM | POA: Diagnosis not present

## 2012-09-19 DIAGNOSIS — J45909 Unspecified asthma, uncomplicated: Secondary | ICD-10-CM | POA: Diagnosis not present

## 2012-09-19 DIAGNOSIS — F411 Generalized anxiety disorder: Secondary | ICD-10-CM | POA: Diagnosis not present

## 2012-09-19 HISTORY — PX: PUBOVAGINAL SLING: SHX1035

## 2012-09-19 HISTORY — DX: Dilated cardiomyopathy: I42.0

## 2012-09-19 HISTORY — DX: Unspecified asthma, uncomplicated: J45.909

## 2012-09-19 HISTORY — DX: Anxiety disorder, unspecified: F41.9

## 2012-09-19 HISTORY — DX: Left bundle-branch block, unspecified: I44.7

## 2012-09-19 HISTORY — DX: Obstructive sleep apnea (adult) (pediatric): G47.33

## 2012-09-19 HISTORY — DX: Chronic combined systolic (congestive) and diastolic (congestive) heart failure: I50.42

## 2012-09-19 HISTORY — DX: Other specified symptoms and signs involving the circulatory and respiratory systems: R09.89

## 2012-09-19 HISTORY — PX: CYSTOSCOPY: SHX5120

## 2012-09-19 HISTORY — DX: Abnormal findings on diagnostic imaging of heart and coronary circulation: R93.1

## 2012-09-19 HISTORY — DX: Stress incontinence (female) (male): N39.3

## 2012-09-19 HISTORY — DX: Old myocardial infarction: I25.2

## 2012-09-19 HISTORY — DX: Personal history of urinary calculi: Z87.442

## 2012-09-19 LAB — POCT I-STAT 4, (NA,K, GLUC, HGB,HCT)
Glucose, Bld: 112 mg/dL — ABNORMAL HIGH (ref 70–99)
HCT: 39 % (ref 36.0–46.0)
Hemoglobin: 12.6 g/dL (ref 12.0–15.0)
Hemoglobin: 13.3 g/dL (ref 12.0–15.0)
Potassium: 2.8 mEq/L — ABNORMAL LOW (ref 3.5–5.1)
Sodium: 137 mEq/L (ref 135–145)

## 2012-09-19 SURGERY — CREATION, PUBOVAGINAL SLING
Anesthesia: General | Site: Vagina | Wound class: Clean Contaminated

## 2012-09-19 MED ORDER — LIDOCAINE HCL (CARDIAC) 20 MG/ML IV SOLN
INTRAVENOUS | Status: DC | PRN
  Administered 2012-09-19: 60 mg via INTRAVENOUS

## 2012-09-19 MED ORDER — LACTATED RINGERS IV SOLN
INTRAVENOUS | Status: DC
  Filled 2012-09-19: qty 1000

## 2012-09-19 MED ORDER — POTASSIUM CHLORIDE ER 10 MEQ PO TBCR
10.0000 meq | EXTENDED_RELEASE_TABLET | Freq: Every day | ORAL | Status: DC
  Filled 2012-09-19: qty 1

## 2012-09-19 MED ORDER — DIAZEPAM 5 MG PO TABS
10.0000 mg | ORAL_TABLET | Freq: Four times a day (QID) | ORAL | Status: DC | PRN
  Filled 2012-09-19: qty 2

## 2012-09-19 MED ORDER — ATORVASTATIN CALCIUM 40 MG PO TABS
40.0000 mg | ORAL_TABLET | Freq: Every day | ORAL | Status: DC
Start: 2012-09-19 — End: 2012-09-20
  Filled 2012-09-19: qty 1

## 2012-09-19 MED ORDER — HYDROCODONE-ACETAMINOPHEN 5-325 MG PO TABS
1.0000 | ORAL_TABLET | ORAL | Status: DC | PRN
  Administered 2012-09-19 (×2): 1 via ORAL
  Filled 2012-09-19: qty 2

## 2012-09-19 MED ORDER — ONDANSETRON HCL 4 MG/2ML IJ SOLN
INTRAMUSCULAR | Status: DC | PRN
  Administered 2012-09-19: 4 mg via INTRAVENOUS

## 2012-09-19 MED ORDER — LACTATED RINGERS IV SOLN
INTRAVENOUS | Status: DC | PRN
  Administered 2012-09-19 (×2): via INTRAVENOUS

## 2012-09-19 MED ORDER — ESTRADIOL 0.1 MG/GM VA CREA
TOPICAL_CREAM | VAGINAL | Status: DC | PRN
  Administered 2012-09-19: 1 via VAGINAL

## 2012-09-19 MED ORDER — LIDOCAINE-EPINEPHRINE (PF) 1 %-1:200000 IJ SOLN
INTRAMUSCULAR | Status: DC | PRN
  Administered 2012-09-19: 5 mL

## 2012-09-19 MED ORDER — BUMETANIDE 1 MG PO TABS
1.0000 mg | ORAL_TABLET | Freq: Every day | ORAL | Status: DC
  Filled 2012-09-19: qty 1

## 2012-09-19 MED ORDER — CEFAZOLIN SODIUM-DEXTROSE 2-3 GM-% IV SOLR
2.0000 g | INTRAVENOUS | Status: DC
  Filled 2012-09-19: qty 50

## 2012-09-19 MED ORDER — CEFAZOLIN SODIUM-DEXTROSE 2-3 GM-% IV SOLR
INTRAVENOUS | Status: DC | PRN
  Administered 2012-09-19: 2 g via INTRAVENOUS

## 2012-09-19 MED ORDER — PROPOFOL 10 MG/ML IV BOLUS
INTRAVENOUS | Status: DC | PRN
  Administered 2012-09-19: 150 mg via INTRAVENOUS

## 2012-09-19 MED ORDER — KCL IN DEXTROSE-NACL 20-5-0.45 MEQ/L-%-% IV SOLN
INTRAVENOUS | Status: DC
  Administered 2012-09-19: 17:00:00 via INTRAVENOUS
  Filled 2012-09-19: qty 1000

## 2012-09-19 MED ORDER — CEFAZOLIN SODIUM 1-5 GM-% IV SOLN
1.0000 g | INTRAVENOUS | Status: DC
  Filled 2012-09-19: qty 50

## 2012-09-19 MED ORDER — SPIRONOLACTONE 25 MG PO TABS
25.0000 mg | ORAL_TABLET | Freq: Every day | ORAL | Status: DC
  Filled 2012-09-19: qty 1

## 2012-09-19 MED ORDER — LACTATED RINGERS IV SOLN
INTRAVENOUS | Status: DC
  Administered 2012-09-19: 08:00:00 via INTRAVENOUS
  Filled 2012-09-19: qty 1000

## 2012-09-19 MED ORDER — CEFAZOLIN SODIUM 1-5 GM-% IV SOLN
1.0000 g | Freq: Three times a day (TID) | INTRAVENOUS | Status: DC
  Filled 2012-09-19: qty 50

## 2012-09-19 MED ORDER — FENTANYL CITRATE 0.05 MG/ML IJ SOLN
INTRAMUSCULAR | Status: DC | PRN
  Administered 2012-09-19: 12.5 ug via INTRAVENOUS
  Administered 2012-09-19: 25 ug via INTRAVENOUS
  Administered 2012-09-19 (×3): 12.5 ug via INTRAVENOUS
  Administered 2012-09-19: 25 ug via INTRAVENOUS

## 2012-09-19 MED ORDER — METOPROLOL SUCCINATE ER 50 MG PO TB24
50.0000 mg | ORAL_TABLET | Freq: Every day | ORAL | Status: DC
  Filled 2012-09-19: qty 1

## 2012-09-19 MED ORDER — MIDAZOLAM HCL 5 MG/5ML IJ SOLN
INTRAMUSCULAR | Status: DC | PRN
  Administered 2012-09-19: 1 mg via INTRAVENOUS
  Administered 2012-09-19 (×2): 0.5 mg via INTRAVENOUS

## 2012-09-19 MED ORDER — CEFAZOLIN SODIUM 1-5 GM-% IV SOLN
1.0000 g | Freq: Three times a day (TID) | INTRAVENOUS | Status: DC
  Administered 2012-09-19 (×2): 1 g via INTRAVENOUS
  Filled 2012-09-19: qty 50

## 2012-09-19 MED ORDER — HEMOSTATIC AGENTS (NO CHARGE) OPTIME
TOPICAL | Status: DC | PRN
  Administered 2012-09-19: 1 via TOPICAL

## 2012-09-19 MED ORDER — STERILE WATER FOR IRRIGATION IR SOLN
Status: DC | PRN
  Administered 2012-09-19: 3000 mL

## 2012-09-19 MED ORDER — DEXAMETHASONE SODIUM PHOSPHATE 4 MG/ML IJ SOLN
INTRAMUSCULAR | Status: DC | PRN
  Administered 2012-09-19: 4 mg via INTRAVENOUS

## 2012-09-19 MED ORDER — KETOROLAC TROMETHAMINE 30 MG/ML IJ SOLN
INTRAMUSCULAR | Status: DC | PRN
  Administered 2012-09-19: 30 mg via INTRAVENOUS

## 2012-09-19 MED ORDER — MORPHINE SULFATE 2 MG/ML IJ SOLN
2.0000 mg | INTRAMUSCULAR | Status: DC | PRN
  Filled 2012-09-19: qty 2

## 2012-09-19 MED ORDER — LEVOTHYROXINE SODIUM 50 MCG PO TABS
50.0000 ug | ORAL_TABLET | Freq: Every day | ORAL | Status: DC
  Filled 2012-09-19: qty 1

## 2012-09-19 MED ORDER — LOSARTAN POTASSIUM-HCTZ 100-25 MG PO TABS: 1.0000 | ORAL_TABLET | Freq: Two times a day (BID) | ORAL | Status: DC

## 2012-09-19 MED ORDER — SODIUM CHLORIDE 0.9 % IR SOLN
Status: DC | PRN
  Administered 2012-09-19: 09:00:00

## 2012-09-19 MED ORDER — PROMETHAZINE HCL 25 MG/ML IJ SOLN
6.2500 mg | INTRAMUSCULAR | Status: DC | PRN
  Filled 2012-09-19: qty 1

## 2012-09-19 MED ORDER — ONDANSETRON HCL 4 MG/2ML IJ SOLN
4.0000 mg | INTRAMUSCULAR | Status: DC | PRN
  Filled 2012-09-19: qty 2

## 2012-09-19 SURGICAL SUPPLY — 41 items
ADH SKN CLS APL DERMABOND .7 (GAUZE/BANDAGES/DRESSINGS) ×2
BAG DRAIN URO-CYSTO SKYTR STRL (DRAIN) ×3 IMPLANT
BAG DRN ANRFLXCHMBR STRAP LEK (BAG)
BAG DRN UROCATH (DRAIN) ×2
BAG URINE LEG 19OZ MD ST LTX (BAG) IMPLANT
BLADE SURG 10 STRL SS (BLADE) ×3 IMPLANT
BLADE SURG 15 STRL LF DISP TIS (BLADE) ×2 IMPLANT
BLADE SURG 15 STRL SS (BLADE) ×3
BLADE SURG ROTATE 9660 (MISCELLANEOUS) ×3 IMPLANT
CATH FOLEY 2WAY SLVR  5CC 16FR (CATHETERS) ×1
CATH FOLEY 2WAY SLVR 5CC 16FR (CATHETERS) ×2 IMPLANT
CLOTH BEACON ORANGE TIMEOUT ST (SAFETY) ×3 IMPLANT
COVER MAYO STAND STRL (DRAPES) ×3 IMPLANT
DERMABOND ADVANCED (GAUZE/BANDAGES/DRESSINGS) ×1
DERMABOND ADVANCED .7 DNX12 (GAUZE/BANDAGES/DRESSINGS) ×2 IMPLANT
ELECT REM PT RETURN 9FT ADLT (ELECTROSURGICAL) ×3
ELECTRODE REM PT RTRN 9FT ADLT (ELECTROSURGICAL) ×2 IMPLANT
FLOSEAL 10ML (HEMOSTASIS) IMPLANT
GLOVE BIO SURGEON STRL SZ7.5 (GLOVE) ×4 IMPLANT
GLOVE BIOGEL PI IND STRL 7.5 (GLOVE) IMPLANT
GLOVE BIOGEL PI INDICATOR 7.5 (GLOVE) ×1
GLOVE ECLIPSE 7.0 STRL STRAW (GLOVE) ×1 IMPLANT
GOWN PREVENTION PLUS LG XLONG (DISPOSABLE) ×3 IMPLANT
GOWN STRL REIN XL XLG (GOWN DISPOSABLE) ×3 IMPLANT
HEMOSTAT SURGICEL 2X14 (HEMOSTASIS) IMPLANT
HEMOSTAT SURGICEL 4X8 (HEMOSTASIS) IMPLANT
NEEDLE HYPO 22GX1.5 SAFETY (NEEDLE) ×3 IMPLANT
NS IRRIG 500ML POUR BTL (IV SOLUTION) IMPLANT
PACK BASIN DAY SURGERY FS (CUSTOM PROCEDURE TRAY) ×3 IMPLANT
PACK CYSTOSCOPY (CUSTOM PROCEDURE TRAY) ×3 IMPLANT
PACKING VAGINAL (PACKING) ×3 IMPLANT
PENCIL BUTTON HOLSTER BLD 10FT (ELECTRODE) ×3 IMPLANT
PLUG CATH AND CAP STER (CATHETERS) ×3 IMPLANT
SLING LYNX SUPRAPUBIC (Sling) ×3 IMPLANT
SUT VIC AB 2-0 UR5 27 (SUTURE) ×3 IMPLANT
SUT VICRYL 3 0 UR 6 27 (SUTURE) IMPLANT
SYR BULB IRRIGATION 50ML (SYRINGE) ×3 IMPLANT
SYRINGE 10CC LL (SYRINGE) ×3 IMPLANT
TUBE CONNECTING 12X1/4 (SUCTIONS) ×3 IMPLANT
WATER STERILE IRR 500ML POUR (IV SOLUTION) ×3 IMPLANT
YANKAUER SUCT BULB TIP NO VENT (SUCTIONS) ×3 IMPLANT

## 2012-09-19 NOTE — Progress Notes (Signed)
Assessment completed Patient alert and oriented  Spouse at bedside repositioned for comfort slight discomfort  At incisional site.  Packing in place peri pad clean and dry no bleeding  Noted. Will continue to monitor.

## 2012-09-19 NOTE — Transfer of Care (Signed)
Immediate Anesthesia Transfer of Care Note  Patient: Laura [name]  Procedure(s) Performed: Procedure(s) (LRB): SUBURETHRAL LYNX SLING (N/A) CYSTOSCOPY FLEXIBLE (N/A)  Patient Location: PACU  Anesthesia Type: General  Level of Consciousness: awake, sedated, patient cooperative and responds to stimulation  Airway & Oxygen Therapy: Patient Spontanous Breathing and Patient connected to face mask oxygen  Post-op Assessment: Report given to PACU RN, Post -op Vital signs reviewed and stable and Patient moving all extremities  Post vital signs: Reviewed and stable  Complications: No apparent anesthesia complications

## 2012-09-19 NOTE — Anesthesia Preprocedure Evaluation (Addendum)
Anesthesia Evaluation  Patient identified by MRN, date of birth, ID band Patient awake    Reviewed: Allergy & Precautions, H&P , NPO status , Patient's Chart, lab work & pertinent test results  Airway Mallampati: II TM Distance: >3 FB Neck ROM: Full    Dental  (+) Teeth Intact and Dental Advisory Given   Pulmonary shortness of breath, asthma , Sleep apnea: Noncompliant with CPAP. ,  breath sounds clear to auscultation  Pulmonary exam normal       Cardiovascular hypertension, + Past MI (NonST elevated MI June 2011) and +CHF + dysrhythmias Rhythm:Regular Rate:Normal  Prior history of Tokotsudo Syndrome with combined systolic, diastolic CHF   Neuro/Psych Anxiety Depression negative neurological ROS  negative psych ROS   GI/Hepatic negative GI ROS, Neg liver ROS,   Endo/Other  Hypothyroidism   Renal/GU negative Renal ROS  negative genitourinary   Musculoskeletal negative musculoskeletal ROS (+)   Abdominal   Peds  Hematology negative hematology ROS (+)   Anesthesia Other Findings Patient states she took her 10mg  dose of Valium this AM  Reproductive/Obstetrics negative OB ROS                         Anesthesia Physical Anesthesia Plan  ASA: III  Anesthesia Plan: General   Post-op Pain Management:    Induction: Intravenous  Airway Management Planned: LMA  Additional Equipment:   Intra-op Plan:   Post-operative Plan: Extubation in OR  Informed Consent: I have reviewed the patients History and Physical, chart, labs and discussed the procedure including the risks, benefits and alternatives for the proposed anesthesia with the patient or authorized representative who has indicated his/her understanding and acceptance.   Dental advisory given  Plan Discussed with: CRNA  Anesthesia Plan Comments:         Anesthesia Quick Evaluation

## 2012-09-19 NOTE — Interval H&P Note (Signed)
History and Physical Interval Note:  09/19/2012 8:37 AM  Laura [name]  has presented today for surgery, with the diagnosis of anal polyp, rectal mucopexy prolapse  The various methods of treatment have been discussed with the patient and family. After consideration of risks, benefits and other options for treatment, the patient has consented to  Procedure(s): SUBURETHRAL LYNX SLING (N/A) as a surgical intervention .  The patient's history has been reviewed, patient examined, no change in status, stable for surgery.  I have reviewed the patient's chart and labs.  Questions were answered to the patient's satisfaction.     Chamberlain Steinborn S

## 2012-09-19 NOTE — Anesthesia Postprocedure Evaluation (Signed)
Anesthesia Post Note  Patient: Laura [name]  Procedure(s) Performed: Procedure(s) (LRB): SUBURETHRAL LYNX SLING (N/A) CYSTOSCOPY FLEXIBLE (N/A)  Anesthesia type: General  Patient location: PACU  Post pain: Pain level controlled  Post assessment: Post-op Vital signs reviewed  Last Vitals:  Filed Vitals:   09/19/12 1006  BP: 106/66  Pulse: 60  Temp: 36.6 C  Resp: 15    Post vital signs: Reviewed  Level of consciousness: sedated  Complications: No apparent anesthesia complications

## 2012-09-19 NOTE — Op Note (Signed)
Preoperative diagnosis: Stress urinary incontinence  Postoperative diagnosis: Same   Procedure: Suburethral retropubic sling Virgina Jock)  Surgeon Valetta Fuller, M.D.  Anesthesia: Gen.  Indication: The patient has had a long-standing stress urinary incontinence. The patient underwent video urodynamics which showed mild bladder instability. Stress urinary incontinence was demonstrated. We discussed the advantages disadvantages as well as the risks of suburethral sling surgery. We went over success rates as well as complications. We discussed the possibility of postoperative urinary retention as well as possible ongoing stress incontinence. She appeared to understand the risks and benefits of this procedure and full informed consent has been obtained.  Technique and findings: The patient was brought to the operating room where she had successful induction of general anesthesia. She had placement of PAS compression boots and received a perioperative antibiotic. She was placed in lithotomy position with all extremities carefully padded. She was then prepped and draped in the usual manner. Appropriate surgical timeout was performed. A weighted vaginal speculum was used. A Foley catheter was inserted and the bladder was completely drained. The anterior vaginal mucosa at the mid urethral level was infiltrated with lidocaine. A small incision was then made over the mid urethra and the dissection carried out bilaterally until we were able to palpate underneath the retropubic space on both sides. 2 small stab incisions were made in the retro pubic area just to the lateral of the midline. Then utilizing direct digital finger control, the needles were passed from the retropubic incision out the right and left sides of the vaginal incision. Flexible cystoscopy was then performed after removal of Foley catheter. Needle passage appeared to be in good position and there was no evidence of perforation of the bladder. A Foley  catheter was reinserted and the bladder again completely drained. The [place] sling was then attached to the needles and the sling was brought out the retropubic incision bilaterally. A right angle clamp was placed at the mid urethra up behind the sling to assure proper tensioning. The sleeves on the sling were then removed in a standard manner and the redundant sling cut at the level of the skin in the retropubic region. The vaginal incision was copiously irrigated with antibiotic solution. A small piece of Surgicel was placed up for some mild vaginal oozing. The vaginal mucosa was then closed with a running 2-0 Vicryl suture. Vaginal packing utilizing bacitracin ointment was then placed. The retropubic incisions were closed with Dermabond. The Foley catheter was left indwelling to gravity drainage. All sponge and needle counts were correct. The patient was brought to recovery room in stable condition.

## 2012-09-19 NOTE — Anesthesia Procedure Notes (Signed)
Procedure Name: LMA Insertion Date/Time: 09/19/2012 9:10 AM Performed by: Jessica Priest Pre-anesthesia Checklist: Patient identified, Emergency Drugs available, Suction available and Patient being monitored Patient Re-evaluated:Patient Re-evaluated prior to inductionOxygen Delivery Method: Circle System Utilized Preoxygenation: Pre-oxygenation with 100% oxygen Intubation Type: IV induction Ventilation: Mask ventilation without difficulty LMA: LMA inserted LMA Size: 4.0 Number of attempts: 1 Airway Equipment and Method: bite block Placement Confirmation: positive ETCO2 Tube secured with: Tape Dental Injury: Teeth and Oropharynx as per pre-operative assessment

## 2012-09-20 ENCOUNTER — Encounter (HOSPITAL_BASED_OUTPATIENT_CLINIC_OR_DEPARTMENT_OTHER): Payer: Self-pay | Admitting: Urology

## 2012-09-20 LAB — BASIC METABOLIC PANEL
CO2: 27 mEq/L (ref 19–32)
Chloride: 97 mEq/L (ref 96–112)
Potassium: 3.2 mEq/L — ABNORMAL LOW (ref 3.5–5.1)
Sodium: 134 mEq/L — ABNORMAL LOW (ref 135–145)

## 2012-09-20 LAB — HEMOGLOBIN AND HEMATOCRIT, BLOOD
HCT: 32.1 % — ABNORMAL LOW (ref 36.0–46.0)
Hemoglobin: 10.8 g/dL — ABNORMAL LOW (ref 12.0–15.0)

## 2012-09-20 MED ORDER — HYDROCODONE-ACETAMINOPHEN 5-325 MG PO TABS: 1.0000 | ORAL_TABLET | ORAL | Status: DC | PRN

## 2012-09-20 NOTE — Discharge Summary (Signed)
Physician Discharge Summary  Patient ID: Laura [name] MRN: [id number] DOB/AGE: [birth year] 67 y.o.  Admit date: 09/19/2012 Discharge date: 09/20/2012  Admission Diagnoses:  Discharge Diagnoses:  Active Problems:   Female stress incontinence   Discharged Condition: good  Hospital Course: Pt. Had sub-urethral sling surgery and kept overnight for observation.No complications. Consults: None  Significant Diagnostic Studies: none  Treatments: surgery: Sling surgery  Discharge Exam: Blood pressure 93/54, pulse 80, temperature 98.3 F (36.8 C), temperature source Oral, resp. rate 18, height 5\' 4"  (1.626 m), weight 90.493 kg (199 lb 8 oz), SpO2 90.00%. General appearance: alert and cooperative Abd: soft NT Resp: nl effort Ext: NT/ no edema  Disposition: 01-Home or Self Care     Medication List    TAKE these medications       albuterol 108 (90 BASE) MCG/ACT inhaler  Commonly known as:  PROVENTIL HFA;VENTOLIN HFA  Inhale 2 puffs into the lungs as needed. Has not needed inhaler in 2 yrs.     atorvastatin 80 MG tablet  Commonly known as:  LIPITOR  Take 40 mg by mouth daily. AM     bumetanide 1 MG tablet  Commonly known as:  BUMEX  Take 1 mg by mouth daily. AM     CALCIUM PO  Take 1 tablet by mouth daily.     diazepam 10 MG tablet  Commonly known as:  VALIUM  Take 10 mg by mouth every 6 (six) hours as needed for anxiety.     DULoxetine 60 MG capsule  Commonly known as:  CYMBALTA  Take 60 mg by mouth daily.     estradiol 1 MG tablet  Commonly known as:  ESTRACE  Take 1 mg by mouth daily. AM     FETZIMA 40 MG Cp24  Generic drug:  Levomilnacipran HCl ER  Take 40 mg by mouth daily.     HYDROcodone-acetaminophen 5-325 MG per tablet  Commonly known as:  NORCO/VICODIN  Take 1-2 tablets by mouth every 4 (four) hours as needed.     levothyroxine 50 MCG tablet  Commonly known as:  SYNTHROID, LEVOTHROID  Take 50 mcg by mouth daily. AM     losartan-hydrochlorothiazide 100-25 MG per tablet  Commonly known as:  HYZAAR  Take 1 tablet by mouth 2 (two) times daily.     metoprolol succinate 50 MG 24 hr tablet  Commonly known as:  TOPROL-XL  Take 50 mg by mouth daily. AM     NUVIGIL 250 MG tablet  Generic drug:  Armodafinil  Take 250 mg by mouth daily. AM     potassium chloride 10 MEQ tablet  Commonly known as:  K-DUR  Take 10 mEq by mouth daily.     spironolactone 25 MG tablet  Commonly known as:  ALDACTONE  Take 25 mg by mouth daily.     Vitamin D3 2000 UNITS capsule  Take 2,000 Units by mouth daily.           Follow-up Information   Follow up On 10/11/2012.      SignedBarron Alvine S 09/20/2012, 7:39 AM

## 2012-09-20 NOTE — Progress Notes (Signed)
Foley cath removed as ordered. Clear yellow urine in drainage bag. Vaginal packing removed. Patient tolerated well.

## 2012-09-21 DIAGNOSIS — R339 Retention of urine, unspecified: Secondary | ICD-10-CM | POA: Diagnosis not present

## 2012-09-26 ENCOUNTER — Ambulatory Visit (HOSPITAL_COMMUNITY)
Admission: RE | Admit: 2012-09-26 | Discharge: 2012-09-26 | Disposition: A | Discharge status: Home / Self Care | Payer: Medicare Other | Source: Ambulatory Visit | Attending: Cardiovascular Disease | Admitting: Cardiovascular Disease

## 2012-09-26 DIAGNOSIS — R609 Edema, unspecified: Secondary | ICD-10-CM | POA: Insufficient documentation

## 2012-09-26 DIAGNOSIS — I517 Cardiomegaly: Secondary | ICD-10-CM | POA: Insufficient documentation

## 2012-09-26 DIAGNOSIS — I447 Left bundle-branch block, unspecified: Secondary | ICD-10-CM | POA: Insufficient documentation

## 2012-09-26 DIAGNOSIS — R079 Chest pain, unspecified: Secondary | ICD-10-CM | POA: Insufficient documentation

## 2012-09-26 DIAGNOSIS — I5181 Takotsubo syndrome: Secondary | ICD-10-CM

## 2012-09-26 DIAGNOSIS — E785 Hyperlipidemia, unspecified: Secondary | ICD-10-CM | POA: Insufficient documentation

## 2012-09-26 DIAGNOSIS — I428 Other cardiomyopathies: Secondary | ICD-10-CM | POA: Insufficient documentation

## 2012-09-26 DIAGNOSIS — I10 Essential (primary) hypertension: Secondary | ICD-10-CM | POA: Insufficient documentation

## 2012-09-26 NOTE — Progress Notes (Signed)
2D Echo Performed 09/26/2012    Ophia Shamoon, RCS  

## 2012-09-27 DIAGNOSIS — E782 Mixed hyperlipidemia: Secondary | ICD-10-CM | POA: Diagnosis not present

## 2012-09-27 DIAGNOSIS — I10 Essential (primary) hypertension: Secondary | ICD-10-CM | POA: Diagnosis not present

## 2012-09-27 DIAGNOSIS — E559 Vitamin D deficiency, unspecified: Secondary | ICD-10-CM | POA: Diagnosis not present

## 2012-09-27 DIAGNOSIS — D649 Anemia, unspecified: Secondary | ICD-10-CM | POA: Diagnosis not present

## 2012-09-27 DIAGNOSIS — R7309 Other abnormal glucose: Secondary | ICD-10-CM | POA: Diagnosis not present

## 2012-09-27 DIAGNOSIS — Z79899 Other long term (current) drug therapy: Secondary | ICD-10-CM | POA: Diagnosis not present

## 2012-09-28 ENCOUNTER — Encounter (HOSPITAL_COMMUNITY): Payer: Self-pay | Admitting: Emergency Medicine

## 2012-09-28 ENCOUNTER — Emergency Department (HOSPITAL_COMMUNITY): Payer: Medicare Other

## 2012-09-28 ENCOUNTER — Inpatient Hospital Stay (HOSPITAL_COMMUNITY)
Admission: EM | Admit: 2012-09-28 | Discharge: 2012-10-01 | DRG: 418 | Disposition: A | Discharge status: Home / Self Care | Payer: Medicare Other | Attending: Surgery | Admitting: Surgery

## 2012-09-28 ENCOUNTER — Other Ambulatory Visit: Payer: Self-pay

## 2012-09-28 DIAGNOSIS — E039 Hypothyroidism, unspecified: Secondary | ICD-10-CM | POA: Diagnosis present

## 2012-09-28 DIAGNOSIS — K8010 Calculus of gallbladder with chronic cholecystitis without obstruction: Secondary | ICD-10-CM | POA: Diagnosis not present

## 2012-09-28 DIAGNOSIS — Z961 Presence of intraocular lens: Secondary | ICD-10-CM | POA: Diagnosis not present

## 2012-09-28 DIAGNOSIS — Z888 Allergy status to other drugs, medicaments and biological substances status: Secondary | ICD-10-CM | POA: Diagnosis not present

## 2012-09-28 DIAGNOSIS — I447 Left bundle-branch block, unspecified: Secondary | ICD-10-CM | POA: Diagnosis present

## 2012-09-28 DIAGNOSIS — K8020 Calculus of gallbladder without cholecystitis without obstruction: Secondary | ICD-10-CM | POA: Diagnosis not present

## 2012-09-28 DIAGNOSIS — I428 Other cardiomyopathies: Secondary | ICD-10-CM | POA: Diagnosis not present

## 2012-09-28 DIAGNOSIS — F411 Generalized anxiety disorder: Secondary | ICD-10-CM | POA: Diagnosis present

## 2012-09-28 DIAGNOSIS — F329 Major depressive disorder, single episode, unspecified: Secondary | ICD-10-CM | POA: Diagnosis present

## 2012-09-28 DIAGNOSIS — E785 Hyperlipidemia, unspecified: Secondary | ICD-10-CM | POA: Diagnosis present

## 2012-09-28 DIAGNOSIS — D360 Benign neoplasm of lymph nodes: Secondary | ICD-10-CM | POA: Diagnosis not present

## 2012-09-28 DIAGNOSIS — I509 Heart failure, unspecified: Secondary | ICD-10-CM | POA: Diagnosis present

## 2012-09-28 DIAGNOSIS — K8000 Calculus of gallbladder with acute cholecystitis without obstruction: Principal | ICD-10-CM | POA: Diagnosis present

## 2012-09-28 DIAGNOSIS — R079 Chest pain, unspecified: Secondary | ICD-10-CM | POA: Diagnosis not present

## 2012-09-28 DIAGNOSIS — G4730 Sleep apnea, unspecified: Secondary | ICD-10-CM | POA: Diagnosis present

## 2012-09-28 DIAGNOSIS — Z9849 Cataract extraction status, unspecified eye: Secondary | ICD-10-CM

## 2012-09-28 DIAGNOSIS — R1013 Epigastric pain: Secondary | ICD-10-CM | POA: Diagnosis not present

## 2012-09-28 DIAGNOSIS — I252 Old myocardial infarction: Secondary | ICD-10-CM | POA: Diagnosis not present

## 2012-09-28 DIAGNOSIS — I10 Essential (primary) hypertension: Secondary | ICD-10-CM | POA: Diagnosis not present

## 2012-09-28 DIAGNOSIS — Z01810 Encounter for preprocedural cardiovascular examination: Secondary | ICD-10-CM | POA: Diagnosis not present

## 2012-09-28 DIAGNOSIS — I429 Cardiomyopathy, unspecified: Secondary | ICD-10-CM | POA: Diagnosis present

## 2012-09-28 DIAGNOSIS — K7581 Nonalcoholic steatohepatitis (NASH): Secondary | ICD-10-CM

## 2012-09-28 DIAGNOSIS — K7689 Other specified diseases of liver: Secondary | ICD-10-CM | POA: Diagnosis present

## 2012-09-28 DIAGNOSIS — I5042 Chronic combined systolic (congestive) and diastolic (congestive) heart failure: Secondary | ICD-10-CM | POA: Diagnosis not present

## 2012-09-28 DIAGNOSIS — G4733 Obstructive sleep apnea (adult) (pediatric): Secondary | ICD-10-CM | POA: Diagnosis present

## 2012-09-28 DIAGNOSIS — J9819 Other pulmonary collapse: Secondary | ICD-10-CM | POA: Diagnosis not present

## 2012-09-28 DIAGNOSIS — Z6833 Body mass index (BMI) 33.0-33.9, adult: Secondary | ICD-10-CM | POA: Diagnosis not present

## 2012-09-28 DIAGNOSIS — K810 Acute cholecystitis: Secondary | ICD-10-CM | POA: Diagnosis not present

## 2012-09-28 DIAGNOSIS — Z8249 Family history of ischemic heart disease and other diseases of the circulatory system: Secondary | ICD-10-CM

## 2012-09-28 DIAGNOSIS — F3289 Other specified depressive episodes: Secondary | ICD-10-CM | POA: Diagnosis present

## 2012-09-28 DIAGNOSIS — F419 Anxiety disorder, unspecified: Secondary | ICD-10-CM | POA: Diagnosis present

## 2012-09-28 DIAGNOSIS — I5181 Takotsubo syndrome: Secondary | ICD-10-CM | POA: Diagnosis present

## 2012-09-28 DIAGNOSIS — E669 Obesity, unspecified: Secondary | ICD-10-CM | POA: Diagnosis present

## 2012-09-28 DIAGNOSIS — K8050 Calculus of bile duct without cholangitis or cholecystitis without obstruction: Secondary | ICD-10-CM

## 2012-09-28 DIAGNOSIS — R109 Unspecified abdominal pain: Secondary | ICD-10-CM | POA: Diagnosis not present

## 2012-09-28 DIAGNOSIS — K829 Disease of gallbladder, unspecified: Secondary | ICD-10-CM | POA: Diagnosis not present

## 2012-09-28 DIAGNOSIS — J45909 Unspecified asthma, uncomplicated: Secondary | ICD-10-CM | POA: Diagnosis not present

## 2012-09-28 LAB — TROPONIN I
Troponin I: <0.3 ng/mL (ref <0.30)
Troponin I: <0.3 ng/mL (ref <0.30)

## 2012-09-28 LAB — BASIC METABOLIC PANEL
CO2: 25 mEq/L (ref 19–32)
GFR calc non Af Amer: 65 mL/min — ABNORMAL LOW (ref >90)
Glucose, Bld: 163 mg/dL — ABNORMAL HIGH (ref 70–99)
Potassium: 3.6 mEq/L (ref 3.5–5.1)
Sodium: 135 mEq/L (ref 135–145)

## 2012-09-28 LAB — CBC
Hemoglobin: 12.3 g/dL (ref 12.0–15.0)
MCHC: 33.5 g/dL (ref 30.0–36.0)
RBC: 4.99 MIL/uL (ref 3.87–5.11)

## 2012-09-28 LAB — HEPATIC FUNCTION PANEL
ALT: 14 U/L (ref 0–35)
Total Protein: 7.4 g/dL (ref 6.0–8.3)

## 2012-09-28 MED ORDER — HYDROMORPHONE HCL PF 1 MG/ML IJ SOLN
0.5000 mg | INTRAMUSCULAR | Status: DC | PRN
  Administered 2012-09-28 (×2): 1 mg via INTRAVENOUS
  Filled 2012-09-28 (×2): qty 1

## 2012-09-28 MED ORDER — MORPHINE SULFATE 4 MG/ML IJ SOLN
4.0000 mg | Freq: Once | INTRAMUSCULAR | Status: AC
  Administered 2012-09-28: 4 mg via INTRAVENOUS
  Filled 2012-09-28: qty 1

## 2012-09-28 MED ORDER — LACTATED RINGERS IV BOLUS (SEPSIS)
1000.0000 mL | Freq: Once | INTRAVENOUS | Status: AC
  Administered 2012-09-28: 1000 mL via INTRAVENOUS

## 2012-09-28 MED ORDER — MORPHINE SULFATE 4 MG/ML IJ SOLN
4.0000 mg | Freq: Once | INTRAMUSCULAR | Status: DC
  Filled 2012-09-28: qty 1

## 2012-09-28 MED ORDER — DIAZEPAM 5 MG PO TABS
5.0000 mg | ORAL_TABLET | Freq: Three times a day (TID) | ORAL | Status: DC | PRN
  Administered 2012-09-29 – 2012-09-30 (×2): 5 mg via ORAL
  Filled 2012-09-28 (×2): qty 1

## 2012-09-28 MED ORDER — ACETAMINOPHEN 325 MG PO TABS
325.0000 mg | ORAL_TABLET | Freq: Four times a day (QID) | ORAL | Status: DC | PRN
  Administered 2012-09-28 – 2012-09-30 (×2): 650 mg via ORAL
  Filled 2012-09-28 (×2): qty 2

## 2012-09-28 MED ORDER — LOSARTAN POTASSIUM-HCTZ 100-25 MG PO TABS: 1.0000 | ORAL_TABLET | Freq: Two times a day (BID) | ORAL | Status: DC

## 2012-09-28 MED ORDER — METOCLOPRAMIDE HCL 5 MG/ML IJ SOLN
10.0000 mg | Freq: Once | INTRAMUSCULAR | Status: AC
  Administered 2012-09-28: 10 mg via INTRAVENOUS
  Filled 2012-09-28: qty 2

## 2012-09-28 MED ORDER — POTASSIUM CHLORIDE ER 10 MEQ PO TBCR
10.0000 meq | EXTENDED_RELEASE_TABLET | Freq: Every day | ORAL | Status: DC
  Administered 2012-09-28: 10 meq via ORAL
  Filled 2012-09-28: qty 1

## 2012-09-28 MED ORDER — SODIUM CHLORIDE 0.9 % IV SOLN
3.0000 g | Freq: Four times a day (QID) | INTRAVENOUS | Status: DC
  Filled 2012-09-28 (×3): qty 3

## 2012-09-28 MED ORDER — DIPHENHYDRAMINE HCL 12.5 MG/5ML PO ELIX: 12.5000 mg | ORAL_SOLUTION | Freq: Four times a day (QID) | ORAL | Status: DC | PRN

## 2012-09-28 MED ORDER — METOPROLOL TARTRATE 12.5 MG HALF TABLET: 12.5000 mg | ORAL_TABLET | Freq: Once | ORAL | Status: DC

## 2012-09-28 MED ORDER — METOPROLOL TARTRATE 12.5 MG HALF TABLET
12.5000 mg | ORAL_TABLET | Freq: Two times a day (BID) | ORAL | Status: DC
  Administered 2012-09-28 – 2012-10-01 (×4): 12.5 mg via ORAL
  Filled 2012-09-28 (×8): qty 1

## 2012-09-28 MED ORDER — FENTANYL CITRATE 0.05 MG/ML IJ SOLN
25.0000 ug | INTRAMUSCULAR | Status: DC | PRN
  Administered 2012-09-29: 23:00:00 via INTRAVENOUS
  Administered 2012-09-29 – 2012-09-30 (×5): 50 ug via INTRAVENOUS
  Filled 2012-09-28 (×7): qty 2

## 2012-09-28 MED ORDER — LOSARTAN POTASSIUM 50 MG PO TABS
100.0000 mg | ORAL_TABLET | Freq: Two times a day (BID) | ORAL | Status: DC
  Administered 2012-09-28 – 2012-10-01 (×5): 100 mg via ORAL
  Filled 2012-09-28 (×7): qty 2

## 2012-09-28 MED ORDER — PANTOPRAZOLE SODIUM 40 MG IV SOLR
40.0000 mg | Freq: Every day | INTRAVENOUS | Status: DC
  Administered 2012-09-28 – 2012-09-30 (×3): 40 mg via INTRAVENOUS
  Filled 2012-09-28 (×4): qty 40

## 2012-09-28 MED ORDER — SODIUM CHLORIDE 0.9 % IV SOLN
1000.0000 mL | Freq: Once | INTRAVENOUS | Status: AC
  Administered 2012-09-28: 1000 mL via INTRAVENOUS

## 2012-09-28 MED ORDER — HYDROMORPHONE HCL PF 1 MG/ML IJ SOLN
1.0000 mg | INTRAMUSCULAR | Status: DC | PRN
Start: 2012-09-28 — End: 2012-09-28

## 2012-09-28 MED ORDER — HEPARIN SODIUM (PORCINE) 5000 UNIT/ML IJ SOLN: 5000.0000 [IU] | Freq: Three times a day (TID) | INTRAMUSCULAR | Status: DC

## 2012-09-28 MED ORDER — HYDROMORPHONE HCL PF 1 MG/ML IJ SOLN: 0.5000 mg | INTRAMUSCULAR | Status: DC | PRN

## 2012-09-28 MED ORDER — SODIUM CHLORIDE 0.9 % IV SOLN
1000.0000 mL | INTRAVENOUS | Status: DC
  Administered 2012-09-28 – 2012-10-01 (×3): 1000 mL via INTRAVENOUS

## 2012-09-28 MED ORDER — ONDANSETRON HCL 4 MG/2ML IJ SOLN
4.0000 mg | Freq: Four times a day (QID) | INTRAMUSCULAR | Status: DC | PRN
  Administered 2012-09-28: 4 mg via INTRAVENOUS
  Filled 2012-09-28: qty 2

## 2012-09-28 MED ORDER — SODIUM CHLORIDE 0.9 % IV BOLUS (SEPSIS)
1000.0000 mL | Freq: Once | INTRAVENOUS | Status: AC
  Administered 2012-09-28: 1000 mL via INTRAVENOUS

## 2012-09-28 MED ORDER — HYDROCHLOROTHIAZIDE 25 MG PO TABS: 25.0000 mg | ORAL_TABLET | Freq: Two times a day (BID) | ORAL | Status: DC

## 2012-09-28 MED ORDER — ALBUTEROL SULFATE HFA 108 (90 BASE) MCG/ACT IN AERS
2.0000 | INHALATION_SPRAY | RESPIRATORY_TRACT | Status: DC | PRN
  Filled 2012-09-28: qty 6.7

## 2012-09-28 MED ORDER — AMPICILLIN-SULBACTAM SODIUM 3 (2-1) G IJ SOLR
3.0000 g | Freq: Four times a day (QID) | INTRAMUSCULAR | Status: DC
  Administered 2012-09-28 – 2012-10-01 (×10): 3 g via INTRAVENOUS
  Filled 2012-09-28 (×14): qty 3

## 2012-09-28 MED ORDER — METOPROLOL TARTRATE 12.5 MG HALF TABLET
12.5000 mg | ORAL_TABLET | Freq: Two times a day (BID) | ORAL | Status: DC | PRN
  Administered 2012-09-30: 12.5 mg via ORAL

## 2012-09-28 MED ORDER — LEVOTHYROXINE SODIUM 50 MCG PO TABS
50.0000 ug | ORAL_TABLET | Freq: Every day | ORAL | Status: DC
  Administered 2012-09-28 – 2012-10-01 (×4): 50 ug via ORAL
  Filled 2012-09-28 (×5): qty 1

## 2012-09-28 MED ORDER — METOPROLOL SUCCINATE ER 50 MG PO TB24
50.0000 mg | ORAL_TABLET | Freq: Every day | ORAL | Status: DC
  Administered 2012-09-28: 50 mg via ORAL
  Filled 2012-09-28: qty 1

## 2012-09-28 MED ORDER — SODIUM CHLORIDE 0.9 % IV SOLN
1.0000 g | INTRAVENOUS | Status: DC
  Administered 2012-09-28: 1 g via INTRAVENOUS
  Filled 2012-09-28 (×2): qty 1

## 2012-09-28 MED ORDER — KETOROLAC TROMETHAMINE 15 MG/ML IJ SOLN
30.0000 mg | Freq: Once | INTRAMUSCULAR | Status: AC
  Administered 2012-09-28: 30 mg via INTRAVENOUS
  Filled 2012-09-28 (×2): qty 1

## 2012-09-28 MED ORDER — ACETAMINOPHEN 650 MG RE SUPP: 650.0000 mg | Freq: Four times a day (QID) | RECTAL | Status: DC | PRN

## 2012-09-28 MED ORDER — POTASSIUM CHLORIDE ER 10 MEQ PO TBCR
10.0000 meq | EXTENDED_RELEASE_TABLET | Freq: Every day | ORAL | Status: DC
  Administered 2012-09-30: 10 meq via ORAL
  Filled 2012-09-28 (×3): qty 1

## 2012-09-28 MED ORDER — PROMETHAZINE HCL 25 MG/ML IJ SOLN
12.5000 mg | Freq: Four times a day (QID) | INTRAMUSCULAR | Status: DC | PRN
  Administered 2012-09-28: 12.5 mg via INTRAVENOUS
  Filled 2012-09-28 (×2): qty 1

## 2012-09-28 MED ORDER — DIPHENHYDRAMINE HCL 50 MG/ML IJ SOLN: 12.5000 mg | Freq: Four times a day (QID) | INTRAMUSCULAR | Status: DC | PRN

## 2012-09-28 MED ORDER — HEPARIN SODIUM (PORCINE) 5000 UNIT/ML IJ SOLN
5000.0000 [IU] | Freq: Three times a day (TID) | INTRAMUSCULAR | Status: AC
  Administered 2012-09-28 (×2): 5000 [IU] via SUBCUTANEOUS

## 2012-09-28 MED ORDER — LOSARTAN POTASSIUM 50 MG PO TABS: 100.0000 mg | ORAL_TABLET | Freq: Two times a day (BID) | ORAL | Status: DC

## 2012-09-28 MED ORDER — MORPHINE SULFATE 4 MG/ML IJ SOLN
6.0000 mg | Freq: Once | INTRAMUSCULAR | Status: AC
  Administered 2012-09-28: 6 mg via INTRAVENOUS
  Filled 2012-09-28: qty 1

## 2012-09-28 MED ORDER — BUMETANIDE 1 MG PO TABS
1.0000 mg | ORAL_TABLET | Freq: Every day | ORAL | Status: DC
  Administered 2012-09-28 – 2012-10-01 (×2): 1 mg via ORAL
  Filled 2012-09-28 (×4): qty 1

## 2012-09-28 MED ORDER — ARMODAFINIL 250 MG PO TABS: 250.0000 mg | ORAL_TABLET | Freq: Every day | ORAL | Status: DC

## 2012-09-28 MED ORDER — SPIRONOLACTONE 25 MG PO TABS: 25.0000 mg | ORAL_TABLET | Freq: Every day | ORAL | Status: DC

## 2012-09-28 NOTE — H&P (Signed)
Laura [name] is an 67 y.o. female.    Chief Complaint: Biliary colic, abdominal pain, nausea, vomiting Requesting MD: Dr. Patria Mane  HPI: 67 yr old female who presented to [facility] with severe chest and upper abdominal pain radiating through to her back that began last night approximate 7 PM. She had severe nausea and vomiting. EMS brought the patient to the emergency department.  She states she's had similar symptoms to this over the past several weeks it is associated with food.  She was in the ER on 3/17 with chest pain symptoms similar but not as severe.  She has not had a history of gallstones as far she knows. She denies fevers or chills.  Her pain is severe and is not being relieved by morphine, she is also continuing to have severe nausea.  She has been having normal BMs.  Food definitely makes her symptoms worse.  She has a history of Takuysubo cardiomyopathy has had 2 heart catheterizations with normal coronary arteries. The most recent catheter in 2011. She is followed by [facility].    She is s/p bladder sling last Monday, 09/19/12, by Dr. Isabel Caprice.  She did have cardiac clearance prior to this procedure.   Past Medical History  Diagnosis Date  . Hypothyroidism   . Anemia   . Seasonal allergies   . Depression   . SUI (stress urinary incontinence, female)   . OSA (obstructive sleep apnea) MODERATE PER STUDY 2005    CPAP NONCOMPLIANT  . History of kidney stones   . Nonischemic dilated cardiomyopathy     MODERATELY DEPRESSED LVF;EF 35-45% by Echo 05/27/11  . LBBB (left bundle branch block)   . Left ventricular ejection fraction less than 40%     38% PER CARDIOLOGIST NOTE (DR CROITORU)  . Short of breath on exertion   . Chronic combined systolic and diastolic CHF, NYHA class 2 CARDIOLOGIST-  DR [id number]  . Hypertension   . History of non-ST elevation myocardial infarction (NSTEMI) JUNE 2011    SECONDARY TO TAKOTSUDO SYNDROME (CARDIAC CATH NORMAL)  . Hyperlipemia   . Anxiety    . Asthma     related to sesonal allergies    Past Surgical History  Procedure Laterality Date  . Nasal septum surgery  1980's  . Abdominal hysterectomy  1985    partial  . Cataract extraction w/ intraocular lens  implant, bilateral    . Right ureteroscopic stone extraction  08-31-2000  . Knee arthroscopy w/ meniscectomy  07-27-2011    MEDIAL AND LATERAL  . Transthoracic echocardiogram  05-27-2011  DR CROITORU    MODERATELY DEPRESSED LVF DUE TO GLOBAL HYPOKINESIS AND MARKED SYSTOLIC ASYNCHRONY/ EF 38%/ MILD LEFT ATRIAL DILATATION  . Cardiac catheterization  09-04-1999;  08/25/2004;   12/09/2009  DR CROITORU    NORMAL CORONARIES/  APICAL BALLOONING OF LV CONSISTENT WITH TAKOTSUBO SYMPTOMS/ EF 30-35%  . Pubovaginal sling N/A 09/19/2012    Procedure: SUBURETHRAL Elio Forget;  Surgeon: Valetta Fuller, MD;  Location: [facility];  Service: Urology;  Laterality: N/A;  . Cystoscopy N/A 09/19/2012    Procedure: CYSTOSCOPY FLEXIBLE;  Surgeon: Valetta Fuller, MD;  Location: [facility];  Service: Urology;  Laterality: N/A;    Family History  Problem Relation Age of Onset  . Pneumonia Mother   . Hypertension Mother   . Heart attack Father   . Fibromyalgia Brother   . Pulmonary embolism Brother   . Hypertension Brother    Social History:  reports that she has never smoked. She has never used smokeless tobacco. She reports that she does not drink alcohol or use illicit drugs.  Allergies:  Allergies  Allergen Reactions  . Ace Inhibitors     unknown  . Fetzima (Levomilnacipran) Other (See Comments)    "Talking out of my head"  . Lasix (Furosemide) Other (See Comments)    HEADACHE  . Codeine Hives and Other (See Comments)    Headache      (Not in a hospital admission)  Results for orders placed during the hospital encounter of 09/28/12 (from the past 48 hour(s))  CBC     Status: Abnormal   Collection Time    09/28/12  1:09 AM      Result Value Range    WBC 9.8  4.0 - 10.5 K/uL   RBC 4.99  3.87 - 5.11 MIL/uL   Hemoglobin 12.3  12.0 - 15.0 g/dL   HCT 16.1  09.6 - 04.5 %   MCV 73.5 (*) 78.0 - 100.0 fL   MCH 24.6 (*) 26.0 - 34.0 pg   MCHC 33.5  30.0 - 36.0 g/dL   RDW 40.9  81.1 - 91.4 %   Platelets 282  150 - 400 K/uL  BASIC METABOLIC PANEL     Status: Abnormal   Collection Time    09/28/12  1:09 AM      Result Value Range   Sodium 135  135 - 145 mEq/L   Potassium 3.6  3.5 - 5.1 mEq/L   Chloride 98  96 - 112 mEq/L   CO2 25  19 - 32 mEq/L   Glucose, Bld 163 (*) 70 - 99 mg/dL   BUN 16  6 - 23 mg/dL   Creatinine, Ser 7.82  0.50 - 1.10 mg/dL   Calcium 9.8  8.4 - 95.6 mg/dL   GFR calc non Af Amer 65 (*) >90 mL/min   GFR calc Af Amer 75 (*) >90 mL/min   Comment:            The eGFR has been calculated     using the CKD EPI equation.     This calculation has not been     validated in all clinical     situations.     eGFR's persistently     <90 mL/min signify     possible Chronic Kidney Disease.  TROPONIN I     Status: None   Collection Time    09/28/12  1:12 AM      Result Value Range   Troponin I <0.30  <0.30 ng/mL   Comment:            Due to the release kinetics of cTnI,     a negative result within the first hours     of the onset of symptoms does not rule out     myocardial infarction with certainty.     If myocardial infarction is still suspected,     repeat the test at appropriate intervals.  TROPONIN I     Status: None   Collection Time    09/28/12  4:17 AM      Result Value Range   Troponin I <0.30  <0.30 ng/mL   Comment:            Due to the release kinetics of cTnI,     a negative result within the first hours     of the onset of symptoms does not rule out  myocardial infarction with certainty.     If myocardial infarction is still suspected,     repeat the test at appropriate intervals.  HEPATIC FUNCTION PANEL     Status: Abnormal   Collection Time    09/28/12  4:17 AM      Result Value Range   Total  Protein 7.4  6.0 - 8.3 g/dL   Albumin 3.7  3.5 - 5.2 g/dL   AST 19  0 - 37 U/L   ALT 14  0 - 35 U/L   Alkaline Phosphatase 76  39 - 117 U/L   Total Bilirubin 0.2 (*) 0.3 - 1.2 mg/dL   Bilirubin, Direct <1.6  0.0 - 0.3 mg/dL   Indirect Bilirubin NOT CALCULATED  0.3 - 0.9 mg/dL  LIPASE, BLOOD     Status: None   Collection Time    09/28/12  4:17 AM      Result Value Range   Lipase 43  11 - 59 U/L   US Abdomen Complete  09/28/2012  *RADIOLOGY REPORT*  Clinical Data: Pain  ABDOMEN ULTRASOUND  Technique:  Complete abdominal ultrasound examination was performed including evaluation of the liver, gallbladder, bile ducts, pancreas, kidneys, spleen, IVC, and abdominal aorta.  Comparison: 06/27/2012  Findings:  Gallbladder:  Multiple echogenic shadowing stones are seen within the lumen of the gallbladder.  Gallbladder wall is mildly thickened at 3 - 4 mm.  No pericholecystic fluid.  The sonographer reports no sonographic Murphy's sign.  Common Bile Duct:  Upper normal diameter is 6 mm.  Liver:  Coarsening of the echotexture suggest fatty infiltration. No intrahepatic biliary dilatation.  No focal intraparenchymal abnormality.  IVC:  Poorly visualized secondary overlying bowel gas.  Pancreas:  Poorly visualized secondary overlying bowel gas.  Spleen:  Normal.  Right kidney:  10.2 cm in long axis.  Normal.  Left kidney:  11.6 cm in long axis. Normal.  Abdominal Aorta:  Difficult to discern given the central abdominal bowel gas.  The visualized portions of the abdominal aorta show no evidence for aneurysm.  IMPRESSION: Cholelithiasis with mild gallbladder wall thickening.  No pericholecystic fluid or sonographic Murphy's sign.  If clinical picture is equivocal for acute cholecystitis, nuclear scintigraphy may prove helpful to further evaluate.   Original Report Authenticated By: Kennith Center, M.D.    Dg Chest Portable 1 View  09/28/2012   *RADIOLOGY REPORT*  Clinical Data: Mid chest pain and nausea.  PORTABLE  CHEST - 1 VIEW  Comparison: 09/12/2012.  Findings: Shallow inspiration with elevation of the left hemidiaphragm.  Probable atelectasis in the left lung base. Interstitial changes likely representing fibrosis.  Normal heart size and pulmonary vascularity.  No focal consolidation or airspace disease.  No blunting of costophrenic angles.  Tortuous and calcified aorta.  No pneumothorax.  No significant change since previous study.  IMPRESSION: Elevation of left hemidiaphragm with atelectasis in the left lung base. No active disease.   Original Report Authenticated By: Burman Nieves, M.D.     Review of Systems  Constitutional: Positive for malaise/fatigue. Negative for fever, chills and weight loss.  HENT: Negative.        Left jaw/ear pain that started in the ambulance today  Eyes: Negative.   Respiratory: Negative.   Cardiovascular: Positive for chest pain (epigastric to lower chest). Negative for palpitations and orthopnea.  Gastrointestinal: Positive for heartburn, nausea, vomiting and abdominal pain.  Genitourinary: Positive for dysuria (recent bladder sling, had to have foley post-op).  Musculoskeletal: Negative.   Skin: Negative.  Neurological: Negative.   Endo/Heme/Allergies: Negative.   Psychiatric/Behavioral: Negative.     Blood pressure 146/69, pulse 91, temperature 98.4 F (36.9 C), temperature source Oral, resp. rate 19, SpO2 99.00%. Physical Exam  Constitutional: She is oriented to person, place, and time. She appears well-developed and well-nourished.  Appears in pain and nauseated.   HENT:  Head: Normocephalic and atraumatic.  Eyes: Conjunctivae are normal. Pupils are equal, round, and reactive to light.  Neck: Normal range of motion. Neck supple.  Cardiovascular: Normal rate and regular rhythm.   Respiratory: Effort normal and breath sounds normal.  GI: Bowel sounds are normal. There is tenderness. There is guarding.  Pain in epigastric/RUQ/lower chest at ribs    Genitourinary:  deferred  Musculoskeletal: Normal range of motion.  Neurological: She is alert and oriented to person, place, and time.  Skin: Skin is warm and dry.  Psychiatric: She has a normal mood and affect. Her behavior is normal. Thought content normal.     Assessment/Plan Biliary colic: the patient's story does seem classic with bilary colic.  Her labs are normal however given her pain level it is concerning that there may be underlying acute cholecystitis that is not seen on U/S.  We will admit the patient for pain control, antiemetics, IV abx and plan for lap cholecystectomy tomorrow.  Howvever given her cardiac history we have asked [facility] to see the patient and give cardiac clearance.  We allow ice chips today but NPO after MN.    WHITE, ELIZABETH 09/28/2012, 10:59 AM

## 2012-09-28 NOTE — ED Provider Notes (Signed)
History     CSN: [id number]  Arrival date & time 09/28/12  4098   First MD Initiated Contact with Patient 09/28/12 0105      Chief Complaint  Patient presents with  . Chest Pain    The history is provided by the patient and medical records.   patient reports developing severe chest and upper abdominal pain radiating through to her back that began last night approximate 7 PM.  She had severe nausea and vomiting.  EMS brought the patient to the emergency department.  They've given aspirin but states because she was vomiting so much they're not sure she kept this down.  She states she's had similar symptoms to this over the past several weeks it is associated with food.  She has not had a history of gallstones as far she knows.  She has a history of Takuysubo cardiomyopathy has had 2 heart catheterizations with normal coronary arteries.  The most recent catheter in 2011.  No fevers or chills.  Past Medical History  Diagnosis Date  . Hypothyroidism   . Anemia   . Seasonal allergies   . Depression   . SUI (stress urinary incontinence, female)   . OSA (obstructive sleep apnea) MODERATE PER STUDY 2005    CPAP NONCOMPLIANT  . History of kidney stones   . Nonischemic dilated cardiomyopathy     MODERATELY DEPRESSED LVF;EF 35-45% by Echo 05/27/11  . LBBB (left bundle branch block)   . Left ventricular ejection fraction less than 40%     38% PER CARDIOLOGIST NOTE (DR CROITORU)  . Short of breath on exertion   . Chronic combined systolic and diastolic CHF, NYHA class 2 CARDIOLOGIST-  DR [id number]  . Hypertension   . History of non-ST elevation myocardial infarction (NSTEMI) JUNE 2011    SECONDARY TO TAKOTSUDO SYNDROME (CARDIAC CATH NORMAL)  . Hyperlipemia   . Anxiety   . Asthma     related to sesonal allergies    Past Surgical History  Procedure Laterality Date  . Nasal septum surgery  1980's  . Abdominal hysterectomy  1985    partial  . Cataract extraction w/ intraocular lens   implant, bilateral    . Right ureteroscopic stone extraction  08-31-2000  . Knee arthroscopy w/ meniscectomy  07-27-2011    MEDIAL AND LATERAL  . Transthoracic echocardiogram  05-27-2011  DR CROITORU    MODERATELY DEPRESSED LVF DUE TO GLOBAL HYPOKINESIS AND MARKED SYSTOLIC ASYNCHRONY/ EF 38%/ MILD LEFT ATRIAL DILATATION  . Cardiac catheterization  09-04-1999;  08/25/2004;   12/09/2009  DR CROITORU    NORMAL CORONARIES/  APICAL BALLOONING OF LV CONSISTENT WITH TAKOTSUBO SYMPTOMS/ EF 30-35%  . Pubovaginal sling N/A 09/19/2012    Procedure: SUBURETHRAL Elio Forget;  Surgeon: Valetta Fuller, MD;  Location: [facility];  Service: Urology;  Laterality: N/A;  . Cystoscopy N/A 09/19/2012    Procedure: CYSTOSCOPY FLEXIBLE;  Surgeon: Valetta Fuller, MD;  Location: [facility];  Service: Urology;  Laterality: N/A;    Family History  Problem Relation Age of Onset  . Pneumonia Mother   . Hypertension Mother   . Heart attack Father   . Fibromyalgia Brother   . Pulmonary embolism Brother   . Hypertension Brother     History  Substance Use Topics  . Smoking status: Never Smoker   . Smokeless tobacco: Never Used  . Alcohol Use: No    OB History   Grav Para Term Preterm Abortions TAB SAB  Ect Mult Living                  Review of Systems  Cardiovascular: Positive for chest pain.  All other systems reviewed and are negative.    Allergies  Ace inhibitors; Fetzima; Lasix; and Codeine  Home Medications   Current Outpatient Rx  Name  Route  Sig  Dispense  Refill  . albuterol (PROVENTIL HFA;VENTOLIN HFA) 108 (90 BASE) MCG/ACT inhaler   Inhalation   Inhale 2 puffs into the lungs as needed. Has not needed inhaler in 2 yrs.         . Armodafinil (NUVIGIL) 250 MG tablet   Oral   Take 250 mg by mouth daily. AM         . atorvastatin (LIPITOR) 80 MG tablet   Oral   Take 40 mg by mouth daily. AM         . bumetanide (BUMEX) 1 MG tablet   Oral   Take 1  mg by mouth daily. AM         . CALCIUM PO   Oral   Take 1 tablet by mouth daily.         . Cholecalciferol (VITAMIN D3) 2000 UNITS capsule   Oral   Take 2,000 Units by mouth daily.         . diazepam (VALIUM) 10 MG tablet   Oral   Take 10 mg by mouth every 6 (six) hours as needed for anxiety.         Marland Kitchen estradiol (ESTRACE) 1 MG tablet   Oral   Take 1 mg by mouth daily. AM         . levothyroxine (SYNTHROID, LEVOTHROID) 50 MCG tablet   Oral   Take 50 mcg by mouth daily. AM         . losartan-hydrochlorothiazide (HYZAAR) 100-25 MG per tablet   Oral   Take 1 tablet by mouth 2 (two) times daily.          . metoprolol (TOPROL-XL) 50 MG 24 hr tablet   Oral   Take 50 mg by mouth daily. AM         . potassium chloride (K-DUR) 10 MEQ tablet   Oral   Take 10 mEq by mouth daily.         Marland Kitchen spironolactone (ALDACTONE) 25 MG tablet   Oral   Take 25 mg by mouth daily.           BP 126/54  Pulse 80  Temp(Src) 97.1 F (36.2 C) (Oral)  Resp 20  SpO2 98%  Physical Exam  Nursing note and vitals reviewed. Constitutional: She is oriented to person, place, and time. She appears well-developed and well-nourished. No distress.  HENT:  Head: Normocephalic and atraumatic.  Eyes: EOM are normal.  Neck: Normal range of motion.  Cardiovascular: Normal rate, regular rhythm and normal heart sounds.   Pulmonary/Chest: Effort normal and breath sounds normal.  Abdominal: Soft. She exhibits no distension.  Mild epigastric tenderness.  No frank right upper quadrant tenderness  Musculoskeletal: Normal range of motion.  Neurological: She is alert and oriented to person, place, and time.  Skin: Skin is warm and dry.  Psychiatric: She has a normal mood and affect. Judgment normal.    ED Course  Procedures (including critical care time)  Labs Reviewed  CBC - Abnormal; Notable for the following:    MCV 73.5 (*)    MCH 24.6 (*)    All  other components within normal  limits  BASIC METABOLIC PANEL - Abnormal; Notable for the following:    Glucose, Bld 163 (*)    GFR calc non Af Amer 65 (*)    GFR calc Af Amer 75 (*)    All other components within normal limits  HEPATIC FUNCTION PANEL - Abnormal; Notable for the following:    Total Bilirubin 0.2 (*)    All other components within normal limits  TROPONIN I  TROPONIN I  LIPASE, BLOOD   US Abdomen Complete  09/28/2012  *RADIOLOGY REPORT*  Clinical Data: Pain  ABDOMEN ULTRASOUND  Technique:  Complete abdominal ultrasound examination was performed including evaluation of the liver, gallbladder, bile ducts, pancreas, kidneys, spleen, IVC, and abdominal aorta.  Comparison: 06/27/2012  Findings:  Gallbladder:  Multiple echogenic shadowing stones are seen within the lumen of the gallbladder.  Gallbladder wall is mildly thickened at 3 - 4 mm.  No pericholecystic fluid.  The sonographer reports no sonographic Murphy's sign.  Common Bile Duct:  Upper normal diameter is 6 mm.  Liver:  Coarsening of the echotexture suggest fatty infiltration. No intrahepatic biliary dilatation.  No focal intraparenchymal abnormality.  IVC:  Poorly visualized secondary overlying bowel gas.  Pancreas:  Poorly visualized secondary overlying bowel gas.  Spleen:  Normal.  Right kidney:  10.2 cm in long axis.  Normal.  Left kidney:  11.6 cm in long axis. Normal.  Abdominal Aorta:  Difficult to discern given the central abdominal bowel gas.  The visualized portions of the abdominal aorta show no evidence for aneurysm.  IMPRESSION: Cholelithiasis with mild gallbladder wall thickening.  No pericholecystic fluid or sonographic Murphy's sign.  If clinical picture is equivocal for acute cholecystitis, nuclear scintigraphy may prove helpful to further evaluate.   Original Report Authenticated By: Kennith Center, M.D.    Dg Chest Portable 1 View  09/28/2012   *RADIOLOGY REPORT*  Clinical Data: Mid chest pain and nausea.  PORTABLE CHEST - 1 VIEW  Comparison:  09/12/2012.  Findings: Shallow inspiration with elevation of the left hemidiaphragm.  Probable atelectasis in the left lung base. Interstitial changes likely representing fibrosis.  Normal heart size and pulmonary vascularity.  No focal consolidation or airspace disease.  No blunting of costophrenic angles.  Tortuous and calcified aorta.  No pneumothorax.  No significant change since previous study.  IMPRESSION: Elevation of left hemidiaphragm with atelectasis in the left lung base. No active disease.   Original Report Authenticated By: Burman Nieves, M.D.    I personally reviewed the imaging tests through PACS system I reviewed available ER/hospitalization records through the EMR   No diagnosis found.    MDM  8:00 AM Patient's had a recurrence of her abdominal pain at this time.  She'll have more morphine at this time.  Given her symptoms are worse with eating and she has gallstones with mild gallbladder wall thickening without pericholecystic fluid or thick with the surgeons regarding symptomatic biliary colic.  She may benefit from having her gallbladder out sooner than later.  From a cardiac standpoint she is clear with 2 negative troponins and a normal EKG         Lyanne Co, MD 09/28/12 [phone]

## 2012-09-28 NOTE — Progress Notes (Signed)
Pt with chills/shakes & temp increased.  HR increased. Groggy & snoring w dilaudid at 1st- better s/p switching to fentanyl/Toradol given Tylenol ordered Unasyn pending EKG c/w ST Cardiology following - ECHO OK Will give IVF bolus & hold diuretics for now.  Keep in B blocker but at intermittent doses Try to get pt to comply w CPAP!

## 2012-09-28 NOTE — ED Notes (Signed)
PT. ARRIVED WITH EMS FROM HOME REPORTS MID CHEST PAIN RADIATING TO MID BACK ONSET 7 PM LAST NIGHT , RECEIVED 3 NTG SL PRIOR TO ARRIVAL WITH SLIGHT RELIEF , EMESIS AT ARRIVAL , ALSO  RECEIVED 4 MG IV ZOFRAN / 324 MG ASA . DESCRIBES PAIN " BURNING".

## 2012-09-28 NOTE — H&P (Addendum)
Story c/w biliary colic but with persistent pain = developing cholecystitis.  Plan lap chole w IOC if cardiology can clear her.  Probably OK given recent surgery OK, but we are cautious w new left jaw/chest pain  The anatomy & physiology of hepatobiliary & pancreatic function was discussed.  The pathophysiology of gallbladder dysfunction was discussed.  Natural history risks without surgery was discussed.   I feel the risks of no intervention will lead to serious problems that outweigh the operative risks; therefore, I recommended cholecystectomy to remove the pathology.  I explained laparoscopic techniques with possible need for an open approach.  Probable cholangiogram to evaluate the bilary tract was explained as well.    Risks such as bleeding, infection, abscess, leak, injury to other organs, need for further treatment, heart attack, death, and other risks were discussed.  I noted a good likelihood this will help address the problem.  Possibility that this will not correct all abdominal symptoms was explained.  Goals of post-operative recovery were discussed as well.  We will work to minimize complications.  An educational handout further explaining the pathology and treatment options was given as well.  Questions were answered.  The patient's sister/husband express understanding & wishes to proceed with surgery.  Hold on diuretics w cholecystitis pre-op - prob OK to resume post-op.  Watch out for dec BP   OSA - NNEDS HER CPAP!!  Groggy w dilaudid - switch to fentanyl.    Chills - try ketorolac x 1 & follow.

## 2012-09-28 NOTE — ED Notes (Signed)
Patient transported to Ultrasound 

## 2012-09-28 NOTE — Progress Notes (Signed)
ANTIBIOTIC CONSULT NOTE - INITIAL  Pharmacy Consult for Unasyn Indication: Cholecystitis - pre-op empiric coverage  Allergies  Allergen Reactions  . Ace Inhibitors     unknown  . Fetzima (Levomilnacipran) Other (See Comments)    "Talking out of my head"  . Lasix (Furosemide) Other (See Comments)    HEADACHE  . Codeine Hives and Other (See Comments)    Headache    Patient Measurements:  Wt Readings from Last 3 Encounters:  09/19/12 199 lb 8 oz (90.493 kg)  09/19/12 199 lb 8 oz (90.493 kg)  04/23/12 185 lb (83.915 kg)   Vital Signs: Temp: 103.1 F (39.5 C) (04/02 1555) Temp src: Oral (04/02 1542) BP: 116/55 mmHg (04/02 1542) Pulse Rate: 136 (04/02 1542)  Labs:  Recent Labs  09/28/12 0109  WBC 9.8  HGB 12.3  PLT 282  CREATININE 0.90    Medical History: Past Medical History  Diagnosis Date  . Hypothyroidism   . Anemia   . Seasonal allergies   . Depression   . SUI (stress urinary incontinence, female)   . OSA (obstructive sleep apnea) MODERATE PER STUDY 2005    CPAP NONCOMPLIANT  . History of kidney stones   . Nonischemic dilated cardiomyopathy     MODERATELY DEPRESSED LVF;EF 35-45% by Echo 05/27/11  . LBBB (left bundle branch block)   . Left ventricular ejection fraction less than 40%     38% PER CARDIOLOGIST NOTE (DR CROITORU)  . Short of breath on exertion   . Chronic combined systolic and diastolic CHF, NYHA class 2 CARDIOLOGIST-  DR [id number]  . Hypertension   . History of non-ST elevation myocardial infarction (NSTEMI) JUNE 2011    SECONDARY TO TAKOTSUDO SYNDROME (CARDIAC CATH NORMAL)  . Hyperlipemia   . Anxiety   . Asthma     related to sesonal allergies   Medications:  Anti-infectives   Start     Dose/Rate Route Frequency Ordered Stop   09/28/12 1800  Ampicillin-Sulbactam (UNASYN) 3 g in sodium chloride 0.9 % 100 mL IVPB  Status:  Discontinued     3 g 100 mL/hr over 60 Minutes Intravenous Every 6 hours 09/28/12 1402 09/28/12 1600   09/28/12  1700  Ampicillin-Sulbactam (UNASYN) 3 g in sodium chloride 0.9 % 100 mL IVPB     3 g 100 mL/hr over 60 Minutes Intravenous Every 6 hours 09/28/12 1600     09/28/12 1200  ertapenem (INVANZ) 1 g in sodium chloride 0.9 % 50 mL IVPB  Status:  Discontinued     1 g 100 mL/hr over 30 Minutes Intravenous Every 24 hours 09/28/12 1101 09/28/12 1402     Assessment: 67 yo who presents with c/o abdominal pain, nausea and vomiting.  She has had some fever, chills and some tachycardia.  She recently had a bladder sling on 3/24 for stress urinary incontinence.  She has been found to have gallbladder dysfunction and will be going to the OR.  We have been asked to maximize her antibiotics for her renal function.  She has a creatinine of ~ 1.0 with an estimated clearance of 60 ml/min.    Goal of Therapy:  Appropriate empiric antibiotics  Plan:  1.  Continue with Unasyn 3 gm every 6 hours. 2.  Monitor renal function and plans for antibiotic de-escalation.  Nadara Mustard, PharmD., MS Clinical Pharmacist Pager:  [phone] Thank you for allowing pharmacy to be part of this patients care team. 09/28/2012,4:53 PM

## 2012-09-28 NOTE — Progress Notes (Signed)
Called to see for tachycardia. EKG shows NSR/ST/LBBB. S Temp 103 rectal  Will follow, Rx Temp with Tylenol.  Corine Shelter PA-C 09/28/2012 3:55 PM

## 2012-09-28 NOTE — Progress Notes (Signed)
Patient with a sustained heart rate of 135. Rhythm SVT. Patient sleeping arouses to voice.No c/os of chest pain or abdominal pain. Kilroy P.A. Notified. Obtain EKG.

## 2012-09-28 NOTE — ED Notes (Signed)
Re[port given to [employer]. To be transported by Fayrene Fearing.

## 2012-09-28 NOTE — Consult Note (Signed)
Reason for Consult: Pre Op cardiac clearance  Requesting Physician: [facility]  HPI: This is a 67 y.o. female with a past medical history significant for Takotsubo Syndrome June 2011. Her EF had recently improved to 45-50% by echo 09/26/12. We recently saw her in there ER after she had a reaction to her psychiatric medications. The family reported she had had some chest pain. We did not feel she had any active cardiac issues at that time. She is admitted now with complaints of epigastric pain. Work up suggest cholecystis. We are asked to see for pre op cardiac clearance. When I saw the pt she had been medicated for pain. She denied any recent chest pain consistent with angina.  PMHx:  Past Medical History  Diagnosis Date  . Hypothyroidism   . Anemia   . Seasonal allergies   . Depression   . SUI (stress urinary incontinence, female)   . OSA (obstructive sleep apnea) MODERATE PER STUDY 2005    CPAP NONCOMPLIANT  . History of kidney stones   . Nonischemic dilated cardiomyopathy     MODERATELY DEPRESSED LVF;EF 35-45% by Echo 05/27/11  . LBBB (left bundle branch block)   . Left ventricular ejection fraction less than 40%     38% PER CARDIOLOGIST NOTE (DR CROITORU)  . Short of breath on exertion   . Chronic combined systolic and diastolic CHF, NYHA class 2 CARDIOLOGIST-  DR ZOXWRUEA  . Hypertension   . History of non-ST elevation myocardial infarction (NSTEMI) JUNE 2011    SECONDARY TO TAKOTSUDO SYNDROME (CARDIAC CATH NORMAL)  . Hyperlipemia   . Anxiety   . Asthma     related to sesonal allergies   Past Surgical History  Procedure Laterality Date  . Nasal septum surgery  1980's  . Abdominal hysterectomy  1985    partial  . Cataract extraction w/ intraocular lens  implant, bilateral    . Right ureteroscopic stone extraction  08-31-2000  . Knee arthroscopy w/ meniscectomy  07-27-2011    MEDIAL AND LATERAL  . Transthoracic echocardiogram  05-27-2011  DR CROITORU    MODERATELY  DEPRESSED LVF DUE TO GLOBAL HYPOKINESIS AND MARKED SYSTOLIC ASYNCHRONY/ EF 38%/ MILD LEFT ATRIAL DILATATION  . Cardiac catheterization  09-04-1999;  08/25/2004;   12/09/2009  DR CROITORU    NORMAL CORONARIES/  APICAL BALLOONING OF LV CONSISTENT WITH TAKOTSUBO SYMPTOMS/ EF 30-35%  . Pubovaginal sling N/A 09/19/2012    Procedure: SUBURETHRAL Elio Forget;  Surgeon: Valetta Fuller, MD;  Location: [facility];  Service: Urology;  Laterality: N/A;  . Cystoscopy N/A 09/19/2012    Procedure: CYSTOSCOPY FLEXIBLE;  Surgeon: Valetta Fuller, MD;  Location: [facility];  Service: Urology;  Laterality: N/A;    FAMHx: Family History  Problem Relation Age of Onset  . Pneumonia Mother   . Hypertension Mother   . Heart attack Father   . Fibromyalgia Brother   . Pulmonary embolism Brother   . Hypertension Brother     SOCHx:  reports that she has never smoked. She has never used smokeless tobacco. She reports that she does not drink alcohol or use illicit drugs.  ALLERGIES: Allergies  Allergen Reactions  . Ace Inhibitors     unknown  . Fetzima (Levomilnacipran) Other (See Comments)    "Talking out of my head"  . Lasix (Furosemide) Other (See Comments)    HEADACHE  . Codeine Hives and Other (See Comments)    Headache     ROS: Pertinent  items are noted in HPI.  HOME MEDICATIONS: Prescriptions prior to admission  Medication Sig Dispense Refill  . albuterol (PROVENTIL HFA;VENTOLIN HFA) 108 (90 BASE) MCG/ACT inhaler Inhale 2 puffs into the lungs as needed. Has not needed inhaler in 2 yrs.      . Armodafinil (NUVIGIL) 250 MG tablet Take 250 mg by mouth daily. AM      . atorvastatin (LIPITOR) 80 MG tablet Take 40 mg by mouth daily. AM      . bumetanide (BUMEX) 1 MG tablet Take 1 mg by mouth daily. AM      . CALCIUM PO Take 1 tablet by mouth daily.      . Cholecalciferol (VITAMIN D3) 2000 UNITS capsule Take 2,000 Units by mouth daily.      . diazepam (VALIUM) 10 MG tablet  Take 10 mg by mouth every 6 (six) hours as needed for anxiety.      Marland Kitchen estradiol (ESTRACE) 1 MG tablet Take 1 mg by mouth daily. AM      . levothyroxine (SYNTHROID, LEVOTHROID) 50 MCG tablet Take 50 mcg by mouth daily. AM      . losartan-hydrochlorothiazide (HYZAAR) 100-25 MG per tablet Take 1 tablet by mouth 2 (two) times daily.       . metoprolol (TOPROL-XL) 50 MG 24 hr tablet Take 50 mg by mouth daily. AM      . potassium chloride (K-DUR) 10 MEQ tablet Take 10 mEq by mouth daily.      Marland Kitchen spironolactone (ALDACTONE) 25 MG tablet Take 25 mg by mouth daily.        HOSPITAL MEDICATIONS: I have reviewed the patient's current medications.  VITALS: Blood pressure 145/69, pulse 91, temperature 99.1 F (37.3 C), temperature source Oral, resp. rate 16, SpO2 93.00%.  PHYSICAL EXAM: General appearance: cooperative, no distress, moderately obese and slowed mentation Neck: no carotid bruit and no JVD Lungs: clear to auscultation bilaterally Heart: regular rate and rhythm, S1, S2 normal, no murmur, click, rub or gallop Abdomen: obese, no RUQ tenderness Extremities: no edema Pulses: 2+ and symmetric Skin: pale. cool and dry Neurologic: Grossly normal, lethargic  LABS: Results for orders placed during the hospital encounter of 09/28/12 (from the past 48 hour(s))  CBC     Status: Abnormal   Collection Time    09/28/12  1:09 AM      Result Value Range   WBC 9.8  4.0 - 10.5 K/uL   RBC 4.99  3.87 - 5.11 MIL/uL   Hemoglobin 12.3  12.0 - 15.0 g/dL   HCT 16.1  09.6 - 04.5 %   MCV 73.5 (*) 78.0 - 100.0 fL   MCH 24.6 (*) 26.0 - 34.0 pg   MCHC 33.5  30.0 - 36.0 g/dL   RDW 40.9  81.1 - 91.4 %   Platelets 282  150 - 400 K/uL  BASIC METABOLIC PANEL     Status: Abnormal   Collection Time    09/28/12  1:09 AM      Result Value Range   Sodium 135  135 - 145 mEq/L   Potassium 3.6  3.5 - 5.1 mEq/L   Chloride 98  96 - 112 mEq/L   CO2 25  19 - 32 mEq/L   Glucose, Bld 163 (*) 70 - 99 mg/dL   BUN 16  6 -  23 mg/dL   Creatinine, Ser 7.82  0.50 - 1.10 mg/dL   Calcium 9.8  8.4 - 95.6 mg/dL   GFR calc non Af Denyse Dago  65 (*) >90 mL/min   GFR calc Af Amer 75 (*) >90 mL/min   Comment:            The eGFR has been calculated     using the CKD EPI equation.     This calculation has not been     validated in all clinical     situations.     eGFR's persistently     <90 mL/min signify     possible Chronic Kidney Disease.  TROPONIN I     Status: None   Collection Time    09/28/12  1:12 AM      Result Value Range   Troponin I <0.30  <0.30 ng/mL   Comment:            Due to the release kinetics of cTnI,     a negative result within the first hours     of the onset of symptoms does not rule out     myocardial infarction with certainty.     If myocardial infarction is still suspected,     repeat the test at appropriate intervals.  TROPONIN I     Status: None   Collection Time    09/28/12  4:17 AM      Result Value Range   Troponin I <0.30  <0.30 ng/mL   Comment:            Due to the release kinetics of cTnI,     a negative result within the first hours     of the onset of symptoms does not rule out     myocardial infarction with certainty.     If myocardial infarction is still suspected,     repeat the test at appropriate intervals.  HEPATIC FUNCTION PANEL     Status: Abnormal   Collection Time    09/28/12  4:17 AM      Result Value Range   Total Protein 7.4  6.0 - 8.3 g/dL   Albumin 3.7  3.5 - 5.2 g/dL   AST 19  0 - 37 U/L   ALT 14  0 - 35 U/L   Alkaline Phosphatase 76  39 - 117 U/L   Total Bilirubin 0.2 (*) 0.3 - 1.2 mg/dL   Bilirubin, Direct <4.5  0.0 - 0.3 mg/dL   Indirect Bilirubin NOT CALCULATED  0.3 - 0.9 mg/dL  LIPASE, BLOOD     Status: None   Collection Time    09/28/12  4:17 AM      Result Value Range   Lipase 43  11 - 59 U/L    IMAGING: US Abdomen Complete  09/28/2012  *RADIOLOGY REPORT*  Clinical Data: Pain  ABDOMEN ULTRASOUND  Technique:  Complete abdominal  ultrasound examination was performed including evaluation of the liver, gallbladder, bile ducts, pancreas, kidneys, spleen, IVC, and abdominal aorta.  Comparison: 06/27/2012  Findings:  Gallbladder:  Multiple echogenic shadowing stones are seen within the lumen of the gallbladder.  Gallbladder wall is mildly thickened at 3 - 4 mm.  No pericholecystic fluid.  The sonographer reports no sonographic Murphy's sign.  Common Bile Duct:  Upper normal diameter is 6 mm.  Liver:  Coarsening of the echotexture suggest fatty infiltration. No intrahepatic biliary dilatation.  No focal intraparenchymal abnormality.  IVC:  Poorly visualized secondary overlying bowel gas.  Pancreas:  Poorly visualized secondary overlying bowel gas.  Spleen:  Normal.  Right kidney:  10.2 cm in long axis.  Normal.  Left kidney:  11.6 cm in long axis. Normal.  Abdominal Aorta:  Difficult to discern given the central abdominal bowel gas.  The visualized portions of the abdominal aorta show no evidence for aneurysm.  IMPRESSION: Cholelithiasis with mild gallbladder wall thickening.  No pericholecystic fluid or sonographic Murphy's sign.  If clinical picture is equivocal for acute cholecystitis, nuclear scintigraphy may prove helpful to further evaluate.   Original Report Authenticated By: Kennith Center, M.D.    Dg Chest Portable 1 View  09/28/2012   *RADIOLOGY REPORT*  Clinical Data: Mid chest pain and nausea.  PORTABLE CHEST - 1 VIEW  Comparison: 09/12/2012.  Findings: Shallow inspiration with elevation of the left hemidiaphragm.  Probable atelectasis in the left lung base. Interstitial changes likely representing fibrosis.  Normal heart size and pulmonary vascularity.  No focal consolidation or airspace disease.  No blunting of costophrenic angles.  Tortuous and calcified aorta.  No pneumothorax.  No significant change since previous study.  IMPRESSION: Elevation of left hemidiaphragm with atelectasis in the left lung base. No active disease.    Original Report Authenticated By: Burman Nieves, M.D.    EKG- NSR, LBBB  IMPRESSION: Principal Problem:   Pre-operative cardiovascular examination Active Problems:   Cholecystitis, acute   Takotsubo syndrome, June 2011.   HTN (hypertension)   Cardiomyopathy- EF 45-50% by echo 09/16/12   Anxiety disorder    Dyslipidemia   LBBB (left bundle branch block)   Obesity   Sleep apnea- non compliant with C-pap   RECOMMENDATION: MD to see, doubt she needs any further cardiac eval pre op. We will be available as needed.  Time Spent Directly with Patient: 35 minutes  KILROY,LUKE K 09/28/2012, 1:12 PM  Agree with note written by [facility]  Takatsubo syndrome with clean cors now with mild LV dysfunction admitted with upper abdominal pain and imaging findings c/w cholelithiasis. Exam benign except for mild epigastric tenderness to palp. EKG chronic LBBB. OK for surgery at low CV risk. Will be available for any questions .  Runell Gess 09/28/2012 2:27 PM

## 2012-09-28 NOTE — Progress Notes (Signed)
Patient placed on CPAP using her home mask and tubing and hospital machine.  Patient was unsure of her settings so RT set machine up in Auto Titration mode with a minimum pressure of 4CMH2O and a maximium pressure of 15 cmH20.  RT will continue to monitor patient.

## 2012-09-28 NOTE — ED Notes (Addendum)
Pt moved from D31 to C27. Family with pt.

## 2012-09-28 NOTE — ED Notes (Signed)
Family at bedside. 

## 2012-09-28 NOTE — ED Notes (Signed)
Report received from primary RN, now assuming care of pt.

## 2012-09-29 ENCOUNTER — Inpatient Hospital Stay (HOSPITAL_COMMUNITY): Payer: Medicare Other | Admitting: Anesthesiology

## 2012-09-29 ENCOUNTER — Encounter (HOSPITAL_COMMUNITY): Payer: Self-pay | Admitting: Certified Registered"

## 2012-09-29 ENCOUNTER — Inpatient Hospital Stay (HOSPITAL_COMMUNITY): Payer: Medicare Other

## 2012-09-29 ENCOUNTER — Encounter (HOSPITAL_COMMUNITY): Payer: Self-pay | Admitting: Anesthesiology

## 2012-09-29 ENCOUNTER — Encounter (HOSPITAL_COMMUNITY): Admission: EM | Disposition: A | Discharge status: Home / Self Care | Payer: Self-pay | Source: Home / Self Care

## 2012-09-29 DIAGNOSIS — I5042 Chronic combined systolic (congestive) and diastolic (congestive) heart failure: Secondary | ICD-10-CM | POA: Diagnosis not present

## 2012-09-29 DIAGNOSIS — K810 Acute cholecystitis: Secondary | ICD-10-CM | POA: Diagnosis not present

## 2012-09-29 DIAGNOSIS — K829 Disease of gallbladder, unspecified: Secondary | ICD-10-CM | POA: Diagnosis not present

## 2012-09-29 DIAGNOSIS — I5181 Takotsubo syndrome: Secondary | ICD-10-CM | POA: Diagnosis not present

## 2012-09-29 DIAGNOSIS — K8010 Calculus of gallbladder with chronic cholecystitis without obstruction: Secondary | ICD-10-CM | POA: Diagnosis not present

## 2012-09-29 DIAGNOSIS — K8000 Calculus of gallbladder with acute cholecystitis without obstruction: Secondary | ICD-10-CM | POA: Diagnosis not present

## 2012-09-29 DIAGNOSIS — D360 Benign neoplasm of lymph nodes: Secondary | ICD-10-CM | POA: Diagnosis not present

## 2012-09-29 DIAGNOSIS — I10 Essential (primary) hypertension: Secondary | ICD-10-CM | POA: Diagnosis not present

## 2012-09-29 DIAGNOSIS — J45909 Unspecified asthma, uncomplicated: Secondary | ICD-10-CM | POA: Diagnosis not present

## 2012-09-29 DIAGNOSIS — I428 Other cardiomyopathies: Secondary | ICD-10-CM | POA: Diagnosis not present

## 2012-09-29 DIAGNOSIS — K7581 Nonalcoholic steatohepatitis (NASH): Secondary | ICD-10-CM

## 2012-09-29 HISTORY — PX: CHOLECYSTECTOMY: SHX55

## 2012-09-29 LAB — CBC
Platelets: 193 10*3/uL (ref 150–400)
RBC: 4.12 MIL/uL (ref 3.87–5.11)
RDW: 15.7 % — ABNORMAL HIGH (ref 11.5–15.5)
WBC: 13.1 10*3/uL — ABNORMAL HIGH (ref 4.0–10.5)

## 2012-09-29 LAB — COMPREHENSIVE METABOLIC PANEL
ALT: 125 U/L — ABNORMAL HIGH (ref 0–35)
AST: 97 U/L — ABNORMAL HIGH (ref 0–37)
Albumin: 2.7 g/dL — ABNORMAL LOW (ref 3.5–5.2)
Chloride: 106 mEq/L (ref 96–112)
Creatinine, Ser: 0.82 mg/dL (ref 0.50–1.10)
Potassium: 4.2 mEq/L (ref 3.5–5.1)
Sodium: 140 mEq/L (ref 135–145)
Total Bilirubin: 1.8 mg/dL — ABNORMAL HIGH (ref 0.3–1.2)

## 2012-09-29 LAB — SURGICAL PCR SCREEN
MRSA, PCR: NEGATIVE
Staphylococcus aureus: NEGATIVE

## 2012-09-29 SURGERY — LAPAROSCOPIC CHOLECYSTECTOMY WITH INTRAOPERATIVE CHOLANGIOGRAM
Anesthesia: General | Site: Abdomen | Wound class: Contaminated

## 2012-09-29 MED ORDER — ROCURONIUM BROMIDE 100 MG/10ML IV SOLN
INTRAVENOUS | Status: DC | PRN
  Administered 2012-09-29: 50 mg via INTRAVENOUS

## 2012-09-29 MED ORDER — SODIUM CHLORIDE 0.9 % IV SOLN
INTRAVENOUS | Status: DC | PRN
  Administered 2012-09-29: 11:00:00

## 2012-09-29 MED ORDER — BUPIVACAINE-EPINEPHRINE 0.25% -1:200000 IJ SOLN
INTRAMUSCULAR | Status: DC | PRN
  Administered 2012-09-29: 50 mL

## 2012-09-29 MED ORDER — FENTANYL CITRATE 0.05 MG/ML IJ SOLN
INTRAMUSCULAR | Status: DC | PRN
  Administered 2012-09-29: 150 ug via INTRAVENOUS
  Administered 2012-09-29: 50 ug via INTRAVENOUS

## 2012-09-29 MED ORDER — 0.9 % SODIUM CHLORIDE (POUR BTL) OPTIME
TOPICAL | Status: DC | PRN
  Administered 2012-09-29: 1000 mL

## 2012-09-29 MED ORDER — LACTATED RINGERS IV SOLN
INTRAVENOUS | Status: DC
  Administered 2012-09-29 – 2012-09-30 (×2): via INTRAVENOUS

## 2012-09-29 MED ORDER — VITAMIN D3 25 MCG (1000 UNIT) PO TABS
2000.0000 [IU] | ORAL_TABLET | Freq: Every day | ORAL | Status: DC
  Administered 2012-09-29 – 2012-10-01 (×3): 2000 [IU] via ORAL
  Filled 2012-09-29 (×3): qty 2

## 2012-09-29 MED ORDER — LIDOCAINE HCL (CARDIAC) 20 MG/ML IV SOLN
INTRAVENOUS | Status: DC | PRN
  Administered 2012-09-29: 80 mg via INTRAVENOUS

## 2012-09-29 MED ORDER — OXYCODONE HCL 5 MG PO TABS: 5.0000 mg | ORAL_TABLET | Freq: Once | ORAL | Status: DC | PRN

## 2012-09-29 MED ORDER — GLYCOPYRROLATE 0.2 MG/ML IJ SOLN
INTRAMUSCULAR | Status: DC | PRN
  Administered 2012-09-29: 0.4 mg via INTRAVENOUS

## 2012-09-29 MED ORDER — PROPOFOL 10 MG/ML IV BOLUS
INTRAVENOUS | Status: DC | PRN
  Administered 2012-09-29: 100 mg via INTRAVENOUS

## 2012-09-29 MED ORDER — OXYCODONE HCL 5 MG PO TABS: 5.0000 mg | ORAL_TABLET | ORAL | Status: DC | PRN

## 2012-09-29 MED ORDER — PROMETHAZINE HCL 12.5 MG PO TABS: 12.5000 mg | ORAL_TABLET | Freq: Four times a day (QID) | ORAL | Status: DC | PRN

## 2012-09-29 MED ORDER — ONDANSETRON HCL 4 MG/2ML IJ SOLN
INTRAMUSCULAR | Status: DC | PRN
  Administered 2012-09-29: 4 mg via INTRAVENOUS

## 2012-09-29 MED ORDER — NEOSTIGMINE METHYLSULFATE 1 MG/ML IJ SOLN
INTRAMUSCULAR | Status: DC | PRN
  Administered 2012-09-29: 3 mg via INTRAVENOUS

## 2012-09-29 MED ORDER — HYDROMORPHONE HCL PF 1 MG/ML IJ SOLN
INTRAMUSCULAR | Status: AC
  Filled 2012-09-29: qty 1

## 2012-09-29 MED ORDER — ACETAMINOPHEN 10 MG/ML IV SOLN
INTRAVENOUS | Status: AC
  Filled 2012-09-29: qty 100

## 2012-09-29 MED ORDER — SODIUM CHLORIDE 0.9 % IR SOLN
Status: DC | PRN
  Administered 2012-09-29: 1000 mL

## 2012-09-29 MED ORDER — ACETAMINOPHEN 10 MG/ML IV SOLN
INTRAVENOUS | Status: DC | PRN
  Administered 2012-09-29: 1000 mg via INTRAVENOUS

## 2012-09-29 MED ORDER — OXYCODONE HCL 5 MG/5ML PO SOLN: 5.0000 mg | Freq: Once | ORAL | Status: DC | PRN

## 2012-09-29 MED ORDER — MIDAZOLAM HCL 5 MG/5ML IJ SOLN
INTRAMUSCULAR | Status: DC | PRN
  Administered 2012-09-29: 2 mg via INTRAVENOUS

## 2012-09-29 MED ORDER — PHENYLEPHRINE HCL 10 MG/ML IJ SOLN
INTRAMUSCULAR | Status: DC | PRN
  Administered 2012-09-29: 120 ug via INTRAVENOUS
  Administered 2012-09-29: 160 ug via INTRAVENOUS
  Administered 2012-09-29: 120 ug via INTRAVENOUS

## 2012-09-29 MED ORDER — HYDROMORPHONE HCL PF 1 MG/ML IJ SOLN
0.2500 mg | INTRAMUSCULAR | Status: DC | PRN
  Administered 2012-09-29: 0.5 mg via INTRAVENOUS

## 2012-09-29 MED ORDER — LACTATED RINGERS IV SOLN
INTRAVENOUS | Status: DC | PRN
  Administered 2012-09-29: 10:00:00 via INTRAVENOUS

## 2012-09-29 MED ORDER — OXYCODONE HCL 5 MG PO TABS
5.0000 mg | ORAL_TABLET | ORAL | Status: DC | PRN
  Administered 2012-09-29 – 2012-10-01 (×5): 10 mg via ORAL
  Filled 2012-09-29 (×5): qty 2

## 2012-09-29 MED ORDER — VITAMIN D3 50 MCG (2000 UT) PO CAPS: 2000.0000 [IU] | ORAL_CAPSULE | Freq: Every day | ORAL | Status: DC

## 2012-09-29 MED ORDER — MEPERIDINE HCL 25 MG/ML IJ SOLN: 6.2500 mg | INTRAMUSCULAR | Status: DC | PRN

## 2012-09-29 MED ORDER — ATORVASTATIN CALCIUM 40 MG PO TABS
40.0000 mg | ORAL_TABLET | Freq: Every day | ORAL | Status: DC
  Administered 2012-09-29 – 2012-09-30 (×2): 40 mg via ORAL
  Filled 2012-09-29 (×3): qty 1

## 2012-09-29 MED ORDER — ONDANSETRON HCL 4 MG/2ML IJ SOLN: 4.0000 mg | Freq: Once | INTRAMUSCULAR | Status: DC | PRN

## 2012-09-29 SURGICAL SUPPLY — 56 items
APPLIER CLIP 5 13 M/L LIGAMAX5 (MISCELLANEOUS) ×2
APPLIER CLIP ROT 10 11.4 M/L (STAPLE) ×2
APR CLP MED LRG 11.4X10 (STAPLE) ×1
APR CLP MED LRG 5 ANG JAW (MISCELLANEOUS) ×1
BAG SPEC RTRVL LRG 6X4 10 (ENDOMECHANICALS) ×1
BLADE SURG ROTATE 9660 (MISCELLANEOUS) IMPLANT
CANISTER SUCTION 2500CC (MISCELLANEOUS) ×2 IMPLANT
CLIP APPLIE 5 13 M/L LIGAMAX5 (MISCELLANEOUS) IMPLANT
CLIP APPLIE ROT 10 11.4 M/L (STAPLE) ×1 IMPLANT
CLOTH BEACON ORANGE TIMEOUT ST (SAFETY) ×2 IMPLANT
COVER MAYO STAND STRL (DRAPES) ×2 IMPLANT
COVER SURGICAL LIGHT HANDLE (MISCELLANEOUS) ×2 IMPLANT
DECANTER SPIKE VIAL GLASS SM (MISCELLANEOUS) ×2 IMPLANT
DRAPE C-ARM 42X72 X-RAY (DRAPES) ×2 IMPLANT
DRAPE WARM FLUID 44X44 (DRAPE) ×2 IMPLANT
DRSG TEGADERM 4X4.75 (GAUZE/BANDAGES/DRESSINGS) ×2 IMPLANT
ELECT REM PT RETURN 9FT ADLT (ELECTROSURGICAL) ×2
ELECTRODE REM PT RTRN 9FT ADLT (ELECTROSURGICAL) ×1 IMPLANT
ENDOLOOP SUT PDS II  0 18 (SUTURE)
ENDOLOOP SUT PDS II 0 18 (SUTURE) IMPLANT
GAUZE SPONGE 2X2 8PLY STRL LF (GAUZE/BANDAGES/DRESSINGS) IMPLANT
GLOVE BIO SURGEON STRL SZ 6.5 (GLOVE) ×1 IMPLANT
GLOVE BIO SURGEON STRL SZ7 (GLOVE) ×2 IMPLANT
GLOVE BIOGEL PI IND STRL 6.5 (GLOVE) IMPLANT
GLOVE BIOGEL PI IND STRL 7.0 (GLOVE) IMPLANT
GLOVE BIOGEL PI IND STRL 8 (GLOVE) ×1 IMPLANT
GLOVE BIOGEL PI INDICATOR 6.5 (GLOVE) ×1
GLOVE BIOGEL PI INDICATOR 7.0 (GLOVE) ×3
GLOVE BIOGEL PI INDICATOR 8 (GLOVE) ×1
GLOVE ECLIPSE 8.0 STRL XLNG CF (GLOVE) ×2 IMPLANT
GLOVE SURG SS PI 7.0 STRL IVOR (GLOVE) ×1 IMPLANT
GOWN PREVENTION PLUS XLARGE (GOWN DISPOSABLE) ×2 IMPLANT
GOWN STRL NON-REIN LRG LVL3 (GOWN DISPOSABLE) ×6 IMPLANT
KIT BASIN OR (CUSTOM PROCEDURE TRAY) ×2 IMPLANT
KIT ROOM TURNOVER OR (KITS) ×2 IMPLANT
NEEDLE 22X1 1/2 (OR ONLY) (NEEDLE) ×2 IMPLANT
NS IRRIG 1000ML POUR BTL (IV SOLUTION) ×2 IMPLANT
PAD ARMBOARD 7.5X6 YLW CONV (MISCELLANEOUS) ×4 IMPLANT
POUCH SPECIMEN RETRIEVAL 10MM (ENDOMECHANICALS) ×1 IMPLANT
SCALPEL HARMONIC ACE (MISCELLANEOUS) ×1 IMPLANT
SCISSORS LAP 5X35 DISP (ENDOMECHANICALS) ×2 IMPLANT
SET CHOLANGIOGRAPH 5 50 .035 (SET/KITS/TRAYS/PACK) ×2 IMPLANT
SET IRRIG TUBING LAPAROSCOPIC (IRRIGATION / IRRIGATOR) ×2 IMPLANT
SLEEVE ENDOPATH XCEL 5M (ENDOMECHANICALS) ×4 IMPLANT
SPECIMEN JAR SMALL (MISCELLANEOUS) ×2 IMPLANT
SPONGE GAUZE 2X2 STER 10/PKG (GAUZE/BANDAGES/DRESSINGS) ×1
SUT MNCRL AB 4-0 PS2 18 (SUTURE) ×2 IMPLANT
SUT VIC AB 0 UR5 27 (SUTURE) ×1 IMPLANT
SUT VICRYL 0 TIES 12 18 (SUTURE) IMPLANT
TOWEL OR 17X24 6PK STRL BLUE (TOWEL DISPOSABLE) ×2 IMPLANT
TOWEL OR 17X26 10 PK STRL BLUE (TOWEL DISPOSABLE) ×2 IMPLANT
TRAY LAPAROSCOPIC (CUSTOM PROCEDURE TRAY) ×2 IMPLANT
TROCAR 5M 150ML BLDLS (TROCAR) ×1 IMPLANT
TROCAR XCEL NON-BLD 11X100MML (ENDOMECHANICALS) ×2 IMPLANT
TROCAR XCEL NON-BLD 5MMX100MML (ENDOMECHANICALS) ×2 IMPLANT
WATER STERILE IRR 1000ML POUR (IV SOLUTION) IMPLANT

## 2012-09-29 NOTE — Progress Notes (Signed)
Received report from Elise RN 

## 2012-09-29 NOTE — Progress Notes (Signed)
The anatomy & physiology of hepatobiliary & pancreatic function was discussed.  The pathophysiology of gallbladder dysfunction was discussed.  Natural history risks without surgery was discussed.   I feel the risks of no intervention will lead to serious problems that outweigh the operative risks; therefore, I recommended cholecystectomy to remove the pathology.  I explained laparoscopic techniques with possible need for an open approach.  Probable cholangiogram to evaluate the bilary tract was explained as well.    Risks such as bleeding, infection, abscess, leak, injury to other organs, need for further treatment, heart attack, death, and other risks were discussed.  I noted a good likelihood this will help address the problem.  Possibility that this will not correct all abdominal symptoms was explained.  Goals of post-operative recovery were discussed as well.  We will work to minimize complications.  An educational handout further explaining the pathology and treatment options was given as well.  Questions were answered.  The patient expresses understanding & wishes to proceed with surgery.  Ardeth Sportsman, M.D., F.A.C.S. Gastrointestinal and Minimally Invasive Surgery [facility] [address], [address] [place], [place] [address] [phone] Main / Paging

## 2012-09-29 NOTE — Anesthesia Preprocedure Evaluation (Signed)
Anesthesia Evaluation    Airway Mallampati: I TM Distance: >3 FB Neck ROM: Full    Dental   Pulmonary asthma , sleep apnea ,          Cardiovascular hypertension, Pt. on medications + Past MI and +CHF + dysrhythmias     Neuro/Psych Anxiety Depression    GI/Hepatic   Endo/Other    Renal/GU      Musculoskeletal   Abdominal   Peds  Hematology   Anesthesia Other Findings   Reproductive/Obstetrics                           Anesthesia Physical Anesthesia Plan  ASA: III  Anesthesia Plan: General   Post-op Pain Management:    Induction: Intravenous  Airway Management Planned: Oral ETT  Additional Equipment:   Intra-op Plan:   Post-operative Plan: Extubation in OR  Informed Consent: I have reviewed the patients History and Physical, chart, labs and discussed the procedure including the risks, benefits and alternatives for the proposed anesthesia with the patient or authorized representative who has indicated his/her understanding and acceptance.     Plan Discussed with: CRNA and Surgeon  Anesthesia Plan Comments:         Anesthesia Quick Evaluation

## 2012-09-29 NOTE — Anesthesia Procedure Notes (Signed)
Procedure Name: Intubation Date/Time: 09/29/2012 10:35 AM Performed by: Jerilee Hoh Pre-anesthesia Checklist: Patient identified, Emergency Drugs available, Suction available and Patient being monitored Patient Re-evaluated:Patient Re-evaluated prior to inductionOxygen Delivery Method: Circle system utilized Preoxygenation: Pre-oxygenation with 100% oxygen Intubation Type: IV induction Ventilation: Mask ventilation without difficulty and Oral airway inserted - appropriate to patient size Laryngoscope Size: Mac and 3 Grade View: Grade I Tube type: Oral Tube size: 7.0 mm Number of attempts: 1 Airway Equipment and Method: Stylet Placement Confirmation: ETT inserted through vocal cords under direct vision,  positive ETCO2 and breath sounds checked- equal and bilateral Secured at: 22 cm Tube secured with: Tape Dental Injury: Teeth and Oropharynx as per pre-operative assessment

## 2012-09-29 NOTE — Anesthesia Postprocedure Evaluation (Signed)
Anesthesia Post Note  Patient: Laura [name]  Procedure(s) Performed: Procedure(s) (LRB): LAPAROSCOPIC CHOLECYSTECTOMY WITH INTRAOPERATIVE CHOLANGIOGRAM (N/A)  Anesthesia type: general  Patient location: PACU  Post pain: Pain level controlled  Post assessment: Patient's Cardiovascular Status Stable  Last Vitals:  Filed Vitals:   09/29/12 1315  BP: 89/51  Pulse: 82  Temp: 36.9 C  Resp: 18    Post vital signs: Reviewed and stable  Level of consciousness: sedated  Complications: No apparent anesthesia complications

## 2012-09-29 NOTE — Progress Notes (Signed)
Subjective: Pt feeling better than yesterday.  More alert today.  Pt's vitals have been much more stable, but BP has been a little low overnight.  No longer febrile or tachycardic.  Pt used her CPAP only part of last night due to discomfort.  Objective: Vital signs in last 24 hours: Temp:  [97.3 F (36.3 C)-103.1 F (39.5 C)] 98.7 F (37.1 C) (04/03 0546) Pulse Rate:  [78-136] 83 (04/03 0546) Resp:  [13-19] 18 (04/03 0546) BP: (79-151)/(46-69) 92/52 mmHg (04/03 0546) SpO2:  [92 %-100 %] 93 % (04/03 0546) Weight:  [194 lb (87.998 kg)] 194 lb (87.998 kg) (04/02 1230) Last BM Date: 09/27/12  Intake/Output from previous day: 04/02 0701 - 04/03 0700 In: 2387.3 [I.V.:2387.3] Out: -  Intake/Output this shift:    PE: Gen:  Alert, NAD, pleasant Card:  RRR, no M/G/R heard Pulm:  CTA, no W/R/R Abd: Soft, NT/ND, +BS, no HSM Ext:  No erythema, edema, or tenderness   Lab Results:   Recent Labs  09/28/12 0109  WBC 9.8  HGB 12.3  HCT 36.7  PLT 282   BMET  Recent Labs  09/28/12 0109  NA 135  K 3.6  CL 98  CO2 25  GLUCOSE 163*  BUN 16  CREATININE 0.90  CALCIUM 9.8   PT/INR No results found for this basename: LABPROT, INR,  in the last 72 hours CMP     Component Value Date/Time   NA 135 09/28/2012 0109   K 3.6 09/28/2012 0109   CL 98 09/28/2012 0109   CO2 25 09/28/2012 0109   GLUCOSE 163* 09/28/2012 0109   BUN 16 09/28/2012 0109   CREATININE 0.90 09/28/2012 0109   CALCIUM 9.8 09/28/2012 0109   PROT 7.4 09/28/2012 0417   ALBUMIN 3.7 09/28/2012 0417   AST 19 09/28/2012 0417   ALT 14 09/28/2012 0417   ALKPHOS 76 09/28/2012 0417   BILITOT 0.2* 09/28/2012 0417   GFRNONAA 65* 09/28/2012 0109   GFRAA 75* 09/28/2012 0109   Lipase     Component Value Date/Time   LIPASE 43 09/28/2012 0417       Studies/Results: US Abdomen Complete  09/28/2012  *RADIOLOGY REPORT*  Clinical Data: Pain  ABDOMEN ULTRASOUND  Technique:  Complete abdominal ultrasound examination was performed including  evaluation of the liver, gallbladder, bile ducts, pancreas, kidneys, spleen, IVC, and abdominal aorta.  Comparison: 06/27/2012  Findings:  Gallbladder:  Multiple echogenic shadowing stones are seen within the lumen of the gallbladder.  Gallbladder wall is mildly thickened at 3 - 4 mm.  No pericholecystic fluid.  The sonographer reports no sonographic Murphy's sign.  Common Bile Duct:  Upper normal diameter is 6 mm.  Liver:  Coarsening of the echotexture suggest fatty infiltration. No intrahepatic biliary dilatation.  No focal intraparenchymal abnormality.  IVC:  Poorly visualized secondary overlying bowel gas.  Pancreas:  Poorly visualized secondary overlying bowel gas.  Spleen:  Normal.  Right kidney:  10.2 cm in long axis.  Normal.  Left kidney:  11.6 cm in long axis. Normal.  Abdominal Aorta:  Difficult to discern given the central abdominal bowel gas.  The visualized portions of the abdominal aorta show no evidence for aneurysm.  IMPRESSION: Cholelithiasis with mild gallbladder wall thickening.  No pericholecystic fluid or sonographic Murphy's sign.  If clinical picture is equivocal for acute cholecystitis, nuclear scintigraphy may prove helpful to further evaluate.   Original Report Authenticated By: Kennith Center, M.D.    Dg Chest Portable 1 View  09/28/2012   *  RADIOLOGY REPORT*  Clinical Data: Mid chest pain and nausea.  PORTABLE CHEST - 1 VIEW  Comparison: 09/12/2012.  Findings: Shallow inspiration with elevation of the left hemidiaphragm.  Probable atelectasis in the left lung base. Interstitial changes likely representing fibrosis.  Normal heart size and pulmonary vascularity.  No focal consolidation or airspace disease.  No blunting of costophrenic angles.  Tortuous and calcified aorta.  No pneumothorax.  No significant change since previous study.  IMPRESSION: Elevation of left hemidiaphragm with atelectasis in the left lung base. No active disease.   Original Report Authenticated By: Burman Nieves,  M.D.     Anti-infectives: Anti-infectives   Start     Dose/Rate Route Frequency Ordered Stop   09/28/12 1800  Ampicillin-Sulbactam (UNASYN) 3 g in sodium chloride 0.9 % 100 mL IVPB  Status:  Discontinued     3 g 100 mL/hr over 60 Minutes Intravenous Every 6 hours 09/28/12 1402 09/28/12 1600   09/28/12 1700  Ampicillin-Sulbactam (UNASYN) 3 g in sodium chloride 0.9 % 100 mL IVPB     3 g 100 mL/hr over 60 Minutes Intravenous Every 6 hours 09/28/12 1600     09/28/12 1200  ertapenem (INVANZ) 1 g in sodium chloride 0.9 % 50 mL IVPB  Status:  Discontinued     1 g 100 mL/hr over 30 Minutes Intravenous Every 24 hours 09/28/12 1101 09/28/12 1402       Assessment/Plan Biliary colic and likely cholecystitis with cholelithiasis 1.  Plan for or today at 10:00am 2.  Held heparin, NPO, and consent form done 3.  Discussed with family and patient who has decided to proceed with surgery  Cardiomyopathy EF 45-50% - cards cleared for surgery, appreciate Dr Allyson Sabal and PA following HTN HLD LBBB Obesity Sleep apnea on Cpap - respiratory following Tokotsubo syndrome Anxiety   LOS: 1 day    DORT, Julianne Chamberlin 09/29/2012, 8:06 AM Pager: [phone]

## 2012-09-29 NOTE — Progress Notes (Signed)
Pt. Refused cpap for tonight. 

## 2012-09-29 NOTE — Op Note (Signed)
09/28/2012 - 09/29/2012  12:14 PM  PATIENT:  Laura [name]  67 y.o. female  Patient Care Team: Lucky Cowboy, MD as PCP - General (Internal Medicine)  PRE-OPERATIVE DIAGNOSIS:  Acute Cholecystitis  POST-OPERATIVE DIAGNOSIS:  Acute Cholecystitis with gallstones Steatohepatitis (fatty liver)  PROCEDURE:  Procedure(s): LAPAROSCOPIC CHOLECYSTECTOMY WITH INTRAOPERATIVE CHOLANGIOGRAM  SURGEON:  Surgeon(s): Ardeth Sportsman, MD  ASSISTANTS:   Megan Dort, PA-C Emina Riebock, ANP   ANESTHESIA:   local and general  EBL:  Total I/O In: 550 [I.V.:550] Out: -   Delay start of Pharmacological VTE agent (>24hrs) due to surgical blood loss or risk of bleeding:  no  DRAINS: none   SPECIMEN:  Source of Specimen:  Gallbladder  DISPOSITION OF SPECIMEN:  PATHOLOGY  COUNTS:  YES  PLAN OF CARE: Admit to inpatient   PATIENT DISPOSITION:  PACU - hemodynamically stable.  INDICATION: Obese female with biliary colic persistent.  Concerning for cholecystitis.  We recommended surgery.  The anatomy & physiology of hepatobiliary & pancreatic function was discussed.  The pathophysiology of gallbladder dysfunction was discussed.  Natural history risks without surgery was discussed.   I feel the risks of no intervention will lead to serious problems that outweigh the operative risks; therefore, I recommended cholecystectomy to remove the pathology.  I explained laparoscopic techniques with possible need for an open approach.  Probable cholangiogram to evaluate the bilary tract was explained as well.    Risks such as bleeding, infection, abscess, leak, injury to other organs, need for further treatment, heart attack, death, and other risks were discussed.  I noted a good likelihood this will help address the problem.  Possibility that this will not correct all abdominal symptoms was explained.  Goals of post-operative recovery were discussed as well.  We will work to minimize complications.  An  educational handout further explaining the pathology and treatment options was given as well.  Questions were answered.  The patient expresses understanding & wishes to proceed with surgery.   OR FINDINGS: Gallbladder wall thickening and edema consistent with acute cholecystitis.  Small gallstones within the gallbladder.  Cholangiogram showing classic anatomy.  No evidence of obstruction or common bile duct stones or other abnormalities.  DESCRIPTION:   The patient was identified & brought in the operating room. The patient was positioned supine with arms tucked. SCDs were active during the entire case. The patient underwent general anesthesia without any difficulty.  Patient was artery on IV Unasyn.  The abdomen was prepped and draped in a sterile fashion. A Surgical Timeout confirmed our plan.  I made a transverse curvilinear incision through the superior umbilical fold.  I placed a 5mm long port through the supraumbilical fascia using a modified Hassan cutdown technique. I began carbon dioxide insufflation. Camera inspection revealed no injury. There were no adhesions to the anterior abdominal wall supraumbilically.  I proceeded to continue with single site technique. I placed a #5 port in left upper aspect of the wound. I placed a 5 mm atraumatic grasper in the right inferior aspect of the wound.  I turned attention to the right upper quadrant.  She had a swollen liver consistent with fatty steatohepatitis.  Somewhat firm but not rigid.  No evidence of cirrhosis grossly.  The greater omentum had dense adhesions to the gallbladder consistent with at least chronic cholecystitis.  Gallbladder had edema consistent with acute cholecystitis.   The gallbladder fundus was elevated cephalad. I freed up adhesions of the greater omentum to the liver and gallbladder  carefully.  I freed a vascular lesions of the duodenal bulb off the gallbladder as well.  I primarily used left eye her dissection and focused  harmonic ultrasonic scalpel dissection.  I freed the peritoneal coverings between the gallbladder and the liver on the posteriolateral and anteriomedial walls. I alternated between Harmonic & blunt Maryland dissection to help get a good critical view of the cystic artery and cystic duct. I did further dissection to free a few centimeters of the  gallbladder off the liver bed to get a good critical view of the infundibulum and cystic duct. I mobilized the cystic artery; and, after getting a good 360 view, ligated the cystic artery using the Harmonic ultrasonic dissection. I skeletonized the cystic duct.  I placed a clip on the infundibulum. I did a partial cystic duct-otomy and ensured patency. I placed a 5 [place] cholangiocatheter through a puncture site at the right subcostal ridge of the abdominal wall and directed it into the cystic duct.  Initially the catheter would not pass.  I repeated ductotomy below an obvious valve.  Catheter advanced much more easily.  We ran a cholangiogram with dilute radio-opaque contrast and continuous fluoroscopy.  Contrast flowed from a side branch consistent with cystic duct cannulization. Contrast flowed up the common hepatic duct into the right and left intrahepatic chains out to secondary radicals. Contrast flowed down the common bile duct easily across the normal ampulla into the duodenum.  Some mild reflux up into the pancreatic duct as well.  This was consistent with a normal cholangiogram.  I removed the cholangiocatheter. I placed clips on the cystic duct x4.   I completed cystic duct transection. I freed the gallbladder from its remaining attachments to the liver. I ensured hemostasis on the gallbladder fossa of the liver and elsewhere. I inspected the rest of the abdomen & detected no injury nor bleeding elsewhere.  I placed the gallbladder inside an Endo Catch bag.  I removed the gallbladder out the supraumbilical fascia. I did not need to dilate the fascia.  A  1 cm fascial defect resulted.  I closed the fascia transversely using 0 Vicryl interrupted stitches. I closed the skin using 4-0 monocryl stitch.  Sterile dressing was applied. The patient was extubated & arrived in the PACU in stable condition..  I had discussed postoperative care with the patient in the holding area.  I am about to locate the patient's family and discuss operative findings and postoperative goals / instructions.  Instructions are written in the chart as well.

## 2012-09-29 NOTE — Preoperative (Signed)
Beta Blockers   Reason not to administer Beta Blockers:Not Applicable 

## 2012-09-29 NOTE — Transfer of Care (Signed)
Immediate Anesthesia Transfer of Care Note  Patient: Laura [name]  Procedure(s) Performed: Procedure(s): LAPAROSCOPIC CHOLECYSTECTOMY WITH INTRAOPERATIVE CHOLANGIOGRAM (N/A)  Patient Location: PACU  Anesthesia Type:General  Level of Consciousness: awake, alert , oriented and patient cooperative  Airway & Oxygen Therapy: Patient Spontanous Breathing and Patient connected to face mask oxygen  Post-op Assessment: Report given to PACU RN, Post -op Vital signs reviewed and stable and Patient moving all extremities  Post vital signs: Reviewed and stable  Complications: No apparent anesthesia complications

## 2012-09-30 ENCOUNTER — Encounter (HOSPITAL_COMMUNITY): Payer: Self-pay | Admitting: Surgery

## 2012-09-30 MED ORDER — ACETAMINOPHEN 500 MG PO TABS
1000.0000 mg | ORAL_TABLET | Freq: Three times a day (TID) | ORAL | Status: DC
  Administered 2012-09-30 – 2012-10-01 (×3): 1000 mg via ORAL
  Filled 2012-09-30 (×6): qty 2

## 2012-09-30 MED ORDER — LACTATED RINGERS IV BOLUS (SEPSIS): 1000.0000 mL | Freq: Three times a day (TID) | INTRAVENOUS | Status: DC | PRN

## 2012-09-30 MED ORDER — WHITE PETROLATUM GEL
Status: AC
  Administered 2012-09-30: 0.2
  Filled 2012-09-30: qty 5

## 2012-09-30 NOTE — Progress Notes (Signed)
ANTIBIOTIC CONSULT NOTE - FOLLOW UP  Pharmacy Consult for Unasyn Indication: Empiric cholecystitis coverage -- now s/p cholecystectomy on 4/3.   Allergies  Allergen Reactions  . Ace Inhibitors     unknown  . Fetzima (Levomilnacipran) Other (See Comments)    "Talking out of my head"  . Lasix (Furosemide) Other (See Comments)    HEADACHE  . Codeine Hives and Other (See Comments)    Headache     Patient Measurements: Height: 5\' 4"  (162.6 cm) Weight: 194 lb (87.998 kg) IBW/kg (Calculated) : 54.7  Vital Signs: Temp: 98.8 F (37.1 C) (04/04 0613) Temp src: Oral (04/04 0613) BP: 105/55 mmHg (04/04 0613) Pulse Rate: 107 (04/04 0613) Intake/Output from previous day: 04/03 0701 - 04/04 0700 In: 2642 [P.O.:240; I.V.:2402] Out: 1700 [Urine:1700] Intake/Output from this shift:    Labs:  Recent Labs  09/28/12 0109 09/29/12 0825  WBC 9.8 13.1*  HGB 12.3 10.2*  PLT 282 193  CREATININE 0.90 0.82   Estimated Creatinine Clearance: 71.5 ml/min (by C-G formula based on Cr of 0.82). No results found for this basename: VANCOTROUGH, Leodis Binet, VANCORANDOM, GENTTROUGH, GENTPEAK, GENTRANDOM, TOBRATROUGH, TOBRAPEAK, TOBRARND, AMIKACINPEAK, AMIKACINTROU, AMIKACIN,  in the last 72 hours   Microbiology: Recent Results (from the past 720 hour(s))  SURGICAL PCR SCREEN     Status: None   Collection Time    09/29/12  6:35 AM      Result Value Range Status   MRSA, PCR NEGATIVE  NEGATIVE Final   Staphylococcus aureus NEGATIVE  NEGATIVE Final   Comment:            The Xpert SA Assay (FDA     approved for NASAL specimens     in patients over 26 years of age),     is one component of     a comprehensive surveillance     program.  Test performance has     been validated by [employer] for patients greater     than or equal to 12 year old.     It is not intended     to diagnose infection nor to     guide or monitor treatment.    Anti-infectives   Start     Dose/Rate Route  Frequency Ordered Stop   09/28/12 1800  Ampicillin-Sulbactam (UNASYN) 3 g in sodium chloride 0.9 % 100 mL IVPB  Status:  Discontinued     3 g 100 mL/hr over 60 Minutes Intravenous Every 6 hours 09/28/12 1402 09/28/12 1600   09/28/12 1700  Ampicillin-Sulbactam (UNASYN) 3 g in sodium chloride 0.9 % 100 mL IVPB     3 g 100 mL/hr over 60 Minutes Intravenous Every 6 hours 09/28/12 1600     09/28/12 1200  ertapenem (INVANZ) 1 g in sodium chloride 0.9 % 50 mL IVPB  Status:  Discontinued     1 g 100 mL/hr over 30 Minutes Intravenous Every 24 hours 09/28/12 1101 09/28/12 1402      Assessment: 67 y.o. F who continues on Unasyn for empiric coverage for cholecystitis now s/p cholecystectomy on 4/3. The patient is now afebrile, no new CBC has been drawn this morning. Consider d/cing or addressing LOT of antibiotics now that the patient is post-op. SCr 0.82, CrCl~70-75 ml/min. Doses remain appropriate.   Goal of Therapy:  Proper antibiotics for infection/cultures adjusted for renal/hepatic function   Plan:  1. Continue Unasyn 3g IV every 6 hours 2. Consider d/cing or addressing LOT of antibiotics now  that the patient is post-op. 3. Will continue to follow renal function, culture results, LOT, and antibiotic de-escalation plans   Georgina Pillion, PharmD, BCPS Clinical Pharmacist Pager: [phone] 09/30/2012 9:18 AM

## 2012-09-30 NOTE — Progress Notes (Addendum)
IV ABX x 5 days  Recheck labs in AM  Crouse diet LOW FAT, sips if worse    -D/C patient from hospital when patient meets criteria:  Tolerating oral intake well Ambulating in walkways Adequate pain control without IV medications Urinating  Having flatus

## 2012-09-30 NOTE — Progress Notes (Signed)
Resp Care Note: Pt refusing to wear CPAP tonight. 

## 2012-09-30 NOTE — Progress Notes (Signed)
1 Day Post-Op  Subjective: Pt in moderate pain still requiring IV pain meds.  Didn't eat anything this am.  Tolerating water and ice chips.  Ambulating in room.  Pt denies flatus or BM.    Objective: Vital signs in last 24 hours: Temp:  [97.2 F (36.2 C)-101.1 F (38.4 C)] 98.6 F (37 C) (04/04 0931) Pulse Rate:  [82-123] 90 (04/04 0931) Resp:  [16-23] 18 (04/04 0931) BP: (85-135)/(49-70) 100/56 mmHg (04/04 0931) SpO2:  [92 %-100 %] 95 % (04/04 0931) Last BM Date: 09/27/12  Intake/Output from previous day: 04/03 0701 - 04/04 0700 In: 2642 [P.O.:240; I.V.:2402] Out: 1700 [Urine:1700] Intake/Output this shift:    PE: Gen:  Alert, NAD, pleasant Abd: Soft, tender especially over periumbilical incision site, mildly distended, +BS, no HSM   Lab Results:   Recent Labs  09/28/12 0109 09/29/12 0825  WBC 9.8 13.1*  HGB 12.3 10.2*  HCT 36.7 31.8*  PLT 282 193   BMET  Recent Labs  09/28/12 0109 09/29/12 0825  NA 135 140  K 3.6 4.2  CL 98 106  CO2 25 25  GLUCOSE 163* 94  BUN 16 15  CREATININE 0.90 0.82  CALCIUM 9.8 8.3*   PT/INR No results found for this basename: LABPROT, INR,  in the last 72 hours CMP     Component Value Date/Time   NA 140 09/29/2012 0825   K 4.2 09/29/2012 0825   CL 106 09/29/2012 0825   CO2 25 09/29/2012 0825   GLUCOSE 94 09/29/2012 0825   BUN 15 09/29/2012 0825   CREATININE 0.82 09/29/2012 0825   CALCIUM 8.3* 09/29/2012 0825   PROT 6.1 09/29/2012 0825   ALBUMIN 2.7* 09/29/2012 0825   AST 97* 09/29/2012 0825   ALT 125* 09/29/2012 0825   ALKPHOS 157* 09/29/2012 0825   BILITOT 1.8* 09/29/2012 0825   GFRNONAA 72* 09/29/2012 0825   GFRAA 84* 09/29/2012 0825   Lipase     Component Value Date/Time   LIPASE 43 09/28/2012 0417       Studies/Results: Dg Cholangiogram Operative  09/29/2012  *RADIOLOGY REPORT*  Intraoperative cholangiogram  History:  Cholecystitis  Findings:  The gallbladder is been removed, and the cystic duct has been cannulated.  The intrahepatic  and extrahepatic biliary ducts appear normal.  No mass or calculus.  There is apparent free flow of contrast via the common bile duct into the duodenum.  There is some retrograde filling of the pancreatic duct; visualized portions of the pancreatic duct are normal.  Impression: No obstructing lesion.  No mass or calculus seen.   Original Report Authenticated By: Bretta Bang, M.D.     Anti-infectives: Anti-infectives   Start     Dose/Rate Route Frequency Ordered Stop   09/28/12 1800  Ampicillin-Sulbactam (UNASYN) 3 g in sodium chloride 0.9 % 100 mL IVPB  Status:  Discontinued     3 g 100 mL/hr over 60 Minutes Intravenous Every 6 hours 09/28/12 1402 09/28/12 1600   09/28/12 1700  Ampicillin-Sulbactam (UNASYN) 3 g in sodium chloride 0.9 % 100 mL IVPB     3 g 100 mL/hr over 60 Minutes Intravenous Every 6 hours 09/28/12 1600     09/28/12 1200  ertapenem (INVANZ) 1 g in sodium chloride 0.9 % 50 mL IVPB  Status:  Discontinued     1 g 100 mL/hr over 30 Minutes Intravenous Every 24 hours 09/28/12 1101 09/28/12 1402       Assessment/Plan Acute cholecystitis with gallstones POD #1 s/p lap chole  with IOC 1.  IVF, pain control, antiemetics 2.  Ambulation, IS, Heparin, scd's 3.  Ordered PT/OT 4.  Can go home when pain controlled on orals, tolerating diet, passing flatus, ambulating; maybe today more likely tomorrow    LOS: 2 days    DORT, Yousef Huge 09/30/2012, 11:02 AM Pager: [phone]

## 2012-10-01 LAB — CBC
HCT: 29.5 % — ABNORMAL LOW (ref 36.0–46.0)
MCHC: 32.2 g/dL (ref 30.0–36.0)
MCV: 77 fL — ABNORMAL LOW (ref 78.0–100.0)
RDW: 15.8 % — ABNORMAL HIGH (ref 11.5–15.5)

## 2012-10-01 LAB — COMPREHENSIVE METABOLIC PANEL
Albumin: 2.5 g/dL — ABNORMAL LOW (ref 3.5–5.2)
BUN: 7 mg/dL (ref 6–23)
Calcium: 8.5 mg/dL (ref 8.4–10.5)
Creatinine, Ser: 0.82 mg/dL (ref 0.50–1.10)
Potassium: 3.2 mEq/L — ABNORMAL LOW (ref 3.5–5.1)
Total Protein: 5.9 g/dL — ABNORMAL LOW (ref 6.0–8.3)

## 2012-10-01 MED ORDER — OXYCODONE HCL 5 MG PO TABS: 5.0000 mg | ORAL_TABLET | Freq: Four times a day (QID) | ORAL | Status: DC | PRN

## 2012-10-01 MED ORDER — POTASSIUM CHLORIDE ER 10 MEQ PO TBCR
40.0000 meq | EXTENDED_RELEASE_TABLET | Freq: Every day | ORAL | Status: DC
  Administered 2012-10-01: 40 meq via ORAL
  Filled 2012-10-01: qty 4

## 2012-10-01 NOTE — Progress Notes (Signed)
Physician Discharge Summary  Patient ID: Laura [name] MRN: [id number] DOB/AGE: April 11, 1946 67 y.o.  Admit date: 09/28/2012 Discharge date: 10/01/2012  Admitting Diagnosis: Abdominal pain Cholelithiasis Biliary colic  Discharge Diagnosis Patient Active Problem List   Diagnosis Date Noted  . Steatohepatitis, nonalcoholic 09/29/2012  . Cholecystitis, acute 09/28/2012  . Pre-operative cardiovascular examination 09/28/2012  . Female stress incontinence 09/19/2012  . Takotsubo syndrome, June 2011.(normal coronaries) 09/12/2012  . Cardiomyopathy- EF 45-50% by echo 09/26/12 09/12/2012  . Anxiety disorder  09/12/2012  . HTN (hypertension) 09/12/2012  . Dyslipidemia 09/12/2012  . LBBB (left bundle branch block) 09/12/2012  . Obesity 09/12/2012  . Sleep apnea- non compliant with C-pap 09/12/2012    Consultants Cardiology  Imaging: Dg Cholangiogram Operative  09/29/2012  *RADIOLOGY REPORT*  Intraoperative cholangiogram  History:  Cholecystitis  Findings:  The gallbladder is been removed, and the cystic duct has been cannulated.  The intrahepatic and extrahepatic biliary ducts appear normal.  No mass or calculus.  There is apparent free flow of contrast via the common bile duct into the duodenum.  There is some retrograde filling of the pancreatic duct; visualized portions of the pancreatic duct are normal.  Impression: No obstructing lesion.  No mass or calculus seen.   Original Report Authenticated By: Bretta Bang, M.D.     Procedures Single site laparoscopic cholecystectomy with IOC (Dr. Michaell Cowing - 09/29/12)  Hospital Course:  67 yr old female who presented to [facility] with severe chest and upper abdominal pain radiating through to her back that began last night approximate 7 PM on 09/27/12. She had severe nausea and vomiting. EMS brought the patient to the emergency department. She states she's had similar symptoms to this over the past several weeks it is associated with food. She was in the  ER on 3/17 with chest pain symptoms similar but not as severe. She has not had a history of gallstones as far she knows. She denies fevers or chills. Her pain is severe and is not being relieved by morphine, she is also continuing to have severe nausea. She has been having normal BMs. Food definitely makes her symptoms worse. She has a history of Takuysubo cardiomyopathy has had 2 heart catheterizations with normal coronary arteries. The most recent catheter in 2011. She is followed by [facility].   Workup showed biliary colic and cholelithiasis and likely cholecystitis.  Cardiology was consulted and they gave recommendation to proceed with the surgery.  Patient was admitted and underwent procedure listed above.  Tolerated procedure well and was transferred to the floor.  Diet was advanced as tolerated.  The patient was seen by Physical therapy and was told she was safe to go home.  The patient had hypokalemia and was supplemented prior to discharge.  She was instructed to eat a banana every day for a week and resume her kdur at home.  On POD #2, the patient was voiding well, tolerating diet, ambulating well, pain well controlled, vital signs stable, incisions c/d/i, labs improve, and felt stable for discharge home.  Patient will follow up in our office in 2-3 weeks and knows to call with questions or concerns.  Physical Exam: General:  Alert, NAD, pleasant, comfortable Abd:  Soft, ND, mild tenderness, incisions C/D/I    Medication List    TAKE these medications       albuterol 108 (90 BASE) MCG/ACT inhaler  Commonly known as:  PROVENTIL HFA;VENTOLIN HFA  Inhale 2 puffs into the lungs as needed. Has not needed inhaler  in 2 yrs.     atorvastatin 80 MG tablet  Commonly known as:  LIPITOR  Take 40 mg by mouth daily. AM     bumetanide 1 MG tablet  Commonly known as:  BUMEX  Take 1 mg by mouth daily. AM     CALCIUM PO  Take 1 tablet by mouth daily.     diazepam 10 MG tablet  Commonly  known as:  VALIUM  Take 10 mg by mouth every 6 (six) hours as needed for anxiety.     estradiol 1 MG tablet  Commonly known as:  ESTRACE  Take 1 mg by mouth daily. AM     levothyroxine 50 MCG tablet  Commonly known as:  SYNTHROID, LEVOTHROID  Take 50 mcg by mouth daily. AM     losartan-hydrochlorothiazide 100-25 MG per tablet  Commonly known as:  HYZAAR  Take 1 tablet by mouth 2 (two) times daily.     metoprolol succinate 50 MG 24 hr tablet  Commonly known as:  TOPROL-XL  Take 50 mg by mouth daily. AM     NUVIGIL 250 MG tablet  Generic drug:  Armodafinil  Take 250 mg by mouth daily. AM     oxyCODONE 5 MG immediate release tablet  Commonly known as:  Oxy IR/ROXICODONE  Take 1-2 tablets (5-10 mg total) by mouth every 6 (six) hours as needed for pain.     potassium chloride 10 MEQ tablet  Commonly known as:  K-DUR  Take 10 mEq by mouth daily.     promethazine 12.5 MG tablet  Commonly known as:  PHENERGAN  Take 1-2 tablets (12.5-25 mg total) by mouth every 6 (six) hours as needed for nausea.     spironolactone 25 MG tablet  Commonly known as:  ALDACTONE  Take 25 mg by mouth daily.     Vitamin D3 2000 UNITS capsule  Take 2,000 Units by mouth daily.         Follow-up Information   Follow up with [facility]. Schedule an appointment as soon as possible for a visit on 10/18/2012. (APPT AT 11:00am, please arrive at 10:30am for check in)    Contact information:   [address]   [place] [address] [phone]       Follow up with Nadean Corwin, MD.   Contact information:   [address] [place] [address] [phone]       Signed: Aris [place], [facility] [phone]  10/01/2012, 10:07 AM

## 2012-10-01 NOTE — Evaluation (Signed)
Occupational Therapy Evaluation and Discharge Patient Details Name: Laura [name] MRN: [id number] DOB: 02-08-1946 Today's Date: 10/01/2012 Time: 6295-2841 OT Time Calculation (min): 30 min  OT Assessment / Plan / Recommendation Clinical Impression  This 67 yo female s/p cholesectomy presents to acute OT with all education completed. Informed PT that pt moving well and was able to do the steps without issue "foot over foot". Acute OT will sign off.    OT Assessment  Patient does not need any further OT services    Follow Up Recommendations  No OT follow up       Equipment Recommendations  None recommended by OT          Precautions / Restrictions Precautions Precaution Comments: midline surgical wound Restrictions Weight Bearing Restrictions: No   Pertinent Vitals/Pain Sore at incision area    ADL  Transfers/Ambulation Related to ADLs: Mod I due to increased time due to soreness from abdominal incision ADL Comments: Cannot don her socks right now, but per her report someone can help her with this until she can do it by herself again        Visit Information  Last OT Received On: 10/01/12 Assistance Needed: +1       Prior Functioning     Home Living Lives With: Spouse Available Help at Discharge: Family;Available PRN/intermittently Type of Home: House Home Access: Stairs to enter [employer] of Steps: 3 Entrance Stairs-Rails: Right;Left;Can reach both Home Layout: Two level;Able to live on main level with bedroom/bathroom Alternate Level Stairs-Rails: Right Bathroom Shower/Tub: Walk-in shower;Curtain Bathroom Toilet: Standard Home Adaptive Equipment: Built-in shower seat Additional Comments:  (hand held shower can be changed from other shower) Prior Function Level of Independence: Independent Able to Take Stairs?: Yes Driving: Yes Vocation: Retired Musician: No difficulties Dominant Hand: Right             Cognition  Cognition Overall Cognitive Status: Appears within functional limits for tasks assessed/performed Arousal/Alertness: Awake/alert Orientation Level: Appears intact for tasks assessed Behavior During Session: [facility] for tasks performed    Extremity/Trunk Assessment Right Upper Extremity Assessment RUE ROM/Strength/Tone: Within functional levels Left Upper Extremity Assessment LUE ROM/Strength/Tone: Within functional levels     Mobility Bed Mobility Bed Mobility: Rolling Left;Left Sidelying to Sit;Sitting - Scoot to Edge of Bed Rolling Left: 6: Modified independent (Device/Increase time) Left Sidelying to Sit: 6: Modified independent (Device/Increase time);HOB flat Sitting - Scoot to Edge of Bed: 6: Modified independent (Device/Increase time) Transfers Transfers: Stand to Sit;Sit to Stand Sit to Stand: 6: Modified independent (Device/Increase time);With upper extremity assist;From bed Stand to Sit: 6: Modified independent (Device/Increase time);With upper extremity assist;With armrests;To chair/3-in-1           End of Session OT - End of Session Equipment Utilized During Treatment:  (None) Activity Tolerance: Patient tolerated treatment well Patient left: in chair;with call bell/phone within reach;with family/visitor present       Evette Georges [phone] 10/01/2012, 10:32 AM

## 2012-10-01 NOTE — Progress Notes (Signed)
Climmie Buelow, MD, MPH, FACS Pager: [phone]  

## 2012-10-10 NOTE — Discharge Summary (Signed)
Physician Discharge Summary  Patient ID: Laura [name] MRN: [id number] DOB/AGE: 02/20/1946 67 y.o.  Admit date: 09/28/2012 Discharge date: 10/01/2012  Admitting Diagnosis: Abdominal pain Cholelithiasis Biliary colic  Discharge Diagnosis Patient Active Problem List   Diagnosis Date Noted  . Steatohepatitis, nonalcoholic 09/29/2012  . Cholecystitis, acute 09/28/2012  . Pre-operative cardiovascular examination 09/28/2012  . Female stress incontinence 09/19/2012  . Takotsubo syndrome, June 2011.(normal coronaries) 09/12/2012  . Cardiomyopathy- EF 45-50% by echo 09/26/12 09/12/2012  . Anxiety disorder  09/12/2012  . HTN (hypertension) 09/12/2012  . Dyslipidemia 09/12/2012  . LBBB (left bundle branch block) 09/12/2012  . Obesity 09/12/2012  . Sleep apnea- non compliant with C-pap 09/12/2012    Consultants Cardiology  Imaging: Dg Cholangiogram Operative  09/29/2012  *RADIOLOGY REPORT*  Intraoperative cholangiogram  History:  Cholecystitis  Findings:  The gallbladder is been removed, and the cystic duct has been cannulated.  The intrahepatic and extrahepatic biliary ducts appear normal.  No mass or calculus.  There is apparent free flow of contrast via the common bile duct into the duodenum.  There is some retrograde filling of the pancreatic duct; visualized portions of the pancreatic duct are normal.  Impression: No obstructing lesion.  No mass or calculus seen.   Original Report Authenticated By: William Woodruff, M.D.     Procedures Single site laparoscopic cholecystectomy with IOC (Dr. Gross - 09/29/12)  Hospital Course:  67 yr old female who presented to [facility] with severe chest and upper abdominal pain radiating through to her back that began last night approximate 7 PM on 09/27/12. She had severe nausea and vomiting. EMS brought the patient to the emergency department. She states she's had similar symptoms to this over the past several weeks it is associated with food. She was in the  ER on 3/17 with chest pain symptoms similar but not as severe. She has not had a history of gallstones as far she knows. She denies fevers or chills. Her pain is severe and is not being relieved by morphine, she is also continuing to have severe nausea. She has been having normal BMs. Food definitely makes her symptoms worse. She has a history of Takuysubo cardiomyopathy has had 2 heart catheterizations with normal coronary arteries. The most recent catheter in 2011. She is followed by [facility].   Workup showed biliary colic and cholelithiasis and likely cholecystitis.  Cardiology was consulted and they gave recommendation to proceed with the surgery.  Patient was admitted and underwent procedure listed above.  Tolerated procedure well and was transferred to the floor.  Diet was advanced as tolerated.  The patient was seen by Physical therapy and was told she was safe to go home.  The patient had hypokalemia and was supplemented prior to discharge.  She was instructed to eat a banana every day for a week and resume her kdur at home.  On POD #2, the patient was voiding well, tolerating diet, ambulating well, pain well controlled, vital signs stable, incisions c/d/i, labs improve, and felt stable for discharge home.  Patient will follow up in our office in 2-3 weeks and knows to call with questions or concerns.  Physical Exam: General:  Alert, NAD, pleasant, comfortable Abd:  Soft, ND, mild tenderness, incisions C/D/I    Medication List    TAKE these medications       albuterol 108 (90 BASE) MCG/ACT inhaler  Commonly known as:  PROVENTIL HFA;VENTOLIN HFA  Inhale 2 puffs into the lungs as needed. Has not needed inhaler   in 2 yrs.     atorvastatin 80 MG tablet  Commonly known as:  LIPITOR  Take 40 mg by mouth daily. AM     bumetanide 1 MG tablet  Commonly known as:  BUMEX  Take 1 mg by mouth daily. AM     CALCIUM PO  Take 1 tablet by mouth daily.     diazepam 10 MG tablet  Commonly  known as:  VALIUM  Take 10 mg by mouth every 6 (six) hours as needed for anxiety.     estradiol 1 MG tablet  Commonly known as:  ESTRACE  Take 1 mg by mouth daily. AM     levothyroxine 50 MCG tablet  Commonly known as:  SYNTHROID, LEVOTHROID  Take 50 mcg by mouth daily. AM     losartan-hydrochlorothiazide 100-25 MG per tablet  Commonly known as:  HYZAAR  Take 1 tablet by mouth 2 (two) times daily.     metoprolol succinate 50 MG 24 hr tablet  Commonly known as:  TOPROL-XL  Take 50 mg by mouth daily. AM     NUVIGIL 250 MG tablet  Generic drug:  Armodafinil  Take 250 mg by mouth daily. AM     oxyCODONE 5 MG immediate release tablet  Commonly known as:  Oxy IR/ROXICODONE  Take 1-2 tablets (5-10 mg total) by mouth every 6 (six) hours as needed for pain.     potassium chloride 10 MEQ tablet  Commonly known as:  K-DUR  Take 10 mEq by mouth daily.     promethazine 12.5 MG tablet  Commonly known as:  PHENERGAN  Take 1-2 tablets (12.5-25 mg total) by mouth every 6 (six) hours as needed for nausea.     spironolactone 25 MG tablet  Commonly known as:  ALDACTONE  Take 25 mg by mouth daily.     Vitamin D3 2000 UNITS capsule  Take 2,000 Units by mouth daily.         Follow-up Information   Follow up with [facility]. Schedule an appointment as soon as possible for a visit on 10/18/2012. (APPT AT 11:00am, please arrive at 10:30am for check in)    Contact information:   [address]   [place] [address] [phone]       Follow up with MCKEOWN,WILLIAM DAVID, MD.   Contact information:   [address] [place]  [address] [phone]       Signed: Deniz Hannan Dort, PA-C [facility] [phone]  10/01/2012, 10:07 AM     

## 2012-10-10 NOTE — Discharge Summary (Signed)
Laura Adcox, MD, MPH, FACS Pager: [phone]  

## 2012-10-11 DIAGNOSIS — R339 Retention of urine, unspecified: Secondary | ICD-10-CM | POA: Diagnosis not present

## 2012-10-11 DIAGNOSIS — N3946 Mixed incontinence: Secondary | ICD-10-CM | POA: Diagnosis not present

## 2012-10-13 ENCOUNTER — Telehealth (INDEPENDENT_AMBULATORY_CARE_PROVIDER_SITE_OTHER): Payer: Self-pay | Admitting: *Deleted

## 2012-10-13 NOTE — Telephone Encounter (Signed)
Rosenbower MD approved to call in Zofran 4mg  1 tablet every 6 hours as needed for nausea #30 no refills.  [facility] [phone]

## 2012-10-13 NOTE — Telephone Encounter (Signed)
Patient called to state that the Phenergan is really helping however it makes her very sleepy.  Patient asking if there is something else that will help with the nausea however not have the side effect of making her sleepy.

## 2012-10-14 ENCOUNTER — Inpatient Hospital Stay (HOSPITAL_COMMUNITY)
Admission: EM | Admit: 2012-10-14 | Discharge: 2012-10-18 | DRG: 392 | Disposition: A | Discharge status: Home / Self Care | Payer: Medicare Other | Attending: General Surgery | Admitting: General Surgery

## 2012-10-14 ENCOUNTER — Emergency Department (HOSPITAL_COMMUNITY): Payer: Medicare Other

## 2012-10-14 ENCOUNTER — Encounter (HOSPITAL_COMMUNITY): Payer: Self-pay | Admitting: Emergency Medicine

## 2012-10-14 DIAGNOSIS — G4733 Obstructive sleep apnea (adult) (pediatric): Secondary | ICD-10-CM | POA: Diagnosis present

## 2012-10-14 DIAGNOSIS — N393 Stress incontinence (female) (male): Secondary | ICD-10-CM | POA: Diagnosis present

## 2012-10-14 DIAGNOSIS — Z9119 Patient's noncompliance with other medical treatment and regimen: Secondary | ICD-10-CM | POA: Diagnosis not present

## 2012-10-14 DIAGNOSIS — I252 Old myocardial infarction: Secondary | ICD-10-CM | POA: Diagnosis not present

## 2012-10-14 DIAGNOSIS — F411 Generalized anxiety disorder: Secondary | ICD-10-CM | POA: Diagnosis present

## 2012-10-14 DIAGNOSIS — D649 Anemia, unspecified: Secondary | ICD-10-CM | POA: Diagnosis present

## 2012-10-14 DIAGNOSIS — K7689 Other specified diseases of liver: Secondary | ICD-10-CM | POA: Diagnosis present

## 2012-10-14 DIAGNOSIS — E785 Hyperlipidemia, unspecified: Secondary | ICD-10-CM | POA: Diagnosis present

## 2012-10-14 DIAGNOSIS — I5042 Chronic combined systolic (congestive) and diastolic (congestive) heart failure: Secondary | ICD-10-CM | POA: Diagnosis present

## 2012-10-14 DIAGNOSIS — I10 Essential (primary) hypertension: Secondary | ICD-10-CM | POA: Diagnosis present

## 2012-10-14 DIAGNOSIS — K859 Acute pancreatitis without necrosis or infection, unspecified: Secondary | ICD-10-CM

## 2012-10-14 DIAGNOSIS — F3289 Other specified depressive episodes: Secondary | ICD-10-CM | POA: Diagnosis present

## 2012-10-14 DIAGNOSIS — I447 Left bundle-branch block, unspecified: Secondary | ICD-10-CM | POA: Diagnosis present

## 2012-10-14 DIAGNOSIS — I509 Heart failure, unspecified: Secondary | ICD-10-CM | POA: Diagnosis present

## 2012-10-14 DIAGNOSIS — E039 Hypothyroidism, unspecified: Secondary | ICD-10-CM | POA: Diagnosis present

## 2012-10-14 DIAGNOSIS — R110 Nausea: Secondary | ICD-10-CM | POA: Diagnosis not present

## 2012-10-14 DIAGNOSIS — F329 Major depressive disorder, single episode, unspecified: Secondary | ICD-10-CM | POA: Diagnosis present

## 2012-10-14 DIAGNOSIS — I428 Other cardiomyopathies: Secondary | ICD-10-CM | POA: Diagnosis present

## 2012-10-14 DIAGNOSIS — Z91199 Patient's noncompliance with other medical treatment and regimen due to unspecified reason: Secondary | ICD-10-CM

## 2012-10-14 DIAGNOSIS — K3189 Other diseases of stomach and duodenum: Principal | ICD-10-CM | POA: Diagnosis present

## 2012-10-14 DIAGNOSIS — R112 Nausea with vomiting, unspecified: Secondary | ICD-10-CM | POA: Diagnosis not present

## 2012-10-14 DIAGNOSIS — R1013 Epigastric pain: Principal | ICD-10-CM | POA: Diagnosis present

## 2012-10-14 DIAGNOSIS — R109 Unspecified abdominal pain: Secondary | ICD-10-CM | POA: Diagnosis not present

## 2012-10-14 DIAGNOSIS — R6881 Early satiety: Secondary | ICD-10-CM | POA: Diagnosis not present

## 2012-10-14 DIAGNOSIS — K30 Functional dyspepsia: Secondary | ICD-10-CM | POA: Clinically undetermined

## 2012-10-14 LAB — URINALYSIS, ROUTINE W REFLEX MICROSCOPIC
Bilirubin Urine: NEGATIVE
Ketones, ur: NEGATIVE mg/dL
Nitrite: NEGATIVE
Protein, ur: NEGATIVE mg/dL
Urobilinogen, UA: 0.2 mg/dL (ref 0.0–1.0)
pH: 7.5 (ref 5.0–8.0)

## 2012-10-14 LAB — COMPREHENSIVE METABOLIC PANEL
Alkaline Phosphatase: 85 U/L (ref 39–117)
BUN: 14 mg/dL (ref 6–23)
CO2: 28 mEq/L (ref 19–32)
GFR calc Af Amer: 66 mL/min — ABNORMAL LOW (ref >90)
GFR calc non Af Amer: 57 mL/min — ABNORMAL LOW (ref >90)
Glucose, Bld: 107 mg/dL — ABNORMAL HIGH (ref 70–99)
Potassium: 3.7 mEq/L (ref 3.5–5.1)
Total Bilirubin: 0.3 mg/dL (ref 0.3–1.2)
Total Protein: 6.9 g/dL (ref 6.0–8.3)

## 2012-10-14 LAB — CBC WITH DIFFERENTIAL/PLATELET
Basophils Relative: 1 % (ref 0–1)
Eosinophils Absolute: 0.1 10*3/uL (ref 0.0–0.7)
Eosinophils Relative: 1 % (ref 0–5)
HCT: 36.9 % (ref 36.0–46.0)
Hemoglobin: 12.4 g/dL (ref 12.0–15.0)
Lymphocytes Relative: 15 % (ref 12–46)
MCH: 25.1 pg — ABNORMAL LOW (ref 26.0–34.0)
MCHC: 33.6 g/dL (ref 30.0–36.0)
Neutro Abs: 6.9 10*3/uL (ref 1.7–7.7)

## 2012-10-14 LAB — CBC
Hemoglobin: 13 g/dL (ref 12.0–15.0)
MCHC: 33.4 g/dL (ref 30.0–36.0)
RDW: 14.8 % (ref 11.5–15.5)
WBC: 7.4 10*3/uL (ref 4.0–10.5)

## 2012-10-14 LAB — CREATININE, SERUM
Creatinine, Ser: 1.04 mg/dL (ref 0.50–1.10)
GFR calc Af Amer: 63 mL/min — ABNORMAL LOW (ref >90)
GFR calc non Af Amer: 54 mL/min — ABNORMAL LOW (ref >90)

## 2012-10-14 LAB — URINE MICROSCOPIC-ADD ON

## 2012-10-14 MED ORDER — MORPHINE SULFATE 2 MG/ML IJ SOLN: 1.0000 mg | INTRAMUSCULAR | Status: DC | PRN

## 2012-10-14 MED ORDER — DIPHENHYDRAMINE HCL 50 MG/ML IJ SOLN: 12.5000 mg | Freq: Four times a day (QID) | INTRAMUSCULAR | Status: DC | PRN

## 2012-10-14 MED ORDER — ATORVASTATIN CALCIUM 40 MG PO TABS
40.0000 mg | ORAL_TABLET | Freq: Every day | ORAL | Status: DC
  Administered 2012-10-15 – 2012-10-18 (×4): 40 mg via ORAL
  Filled 2012-10-14 (×4): qty 1

## 2012-10-14 MED ORDER — SPIRONOLACTONE 25 MG PO TABS
25.0000 mg | ORAL_TABLET | Freq: Every day | ORAL | Status: DC
  Administered 2012-10-15 – 2012-10-18 (×4): 25 mg via ORAL
  Filled 2012-10-14 (×4): qty 1

## 2012-10-14 MED ORDER — LOSARTAN POTASSIUM-HCTZ 100-25 MG PO TABS
1.0000 | ORAL_TABLET | Freq: Two times a day (BID) | ORAL | Status: DC
Start: 2012-10-14 — End: 2012-10-14

## 2012-10-14 MED ORDER — ACETAMINOPHEN 325 MG PO TABS
650.0000 mg | ORAL_TABLET | Freq: Four times a day (QID) | ORAL | Status: DC | PRN
  Administered 2012-10-14 – 2012-10-15 (×2): 650 mg via ORAL
  Filled 2012-10-14 (×2): qty 2

## 2012-10-14 MED ORDER — ARMODAFINIL 250 MG PO TABS: 250.0000 mg | ORAL_TABLET | Freq: Every day | ORAL | Status: DC

## 2012-10-14 MED ORDER — HYDROCHLOROTHIAZIDE 25 MG PO TABS
25.0000 mg | ORAL_TABLET | Freq: Two times a day (BID) | ORAL | Status: DC
  Administered 2012-10-15 – 2012-10-18 (×6): 25 mg via ORAL
  Filled 2012-10-14 (×9): qty 1

## 2012-10-14 MED ORDER — SODIUM CHLORIDE 0.9 % IV BOLUS (SEPSIS)
500.0000 mL | Freq: Once | INTRAVENOUS | Status: AC
  Administered 2012-10-14: 500 mL via INTRAVENOUS

## 2012-10-14 MED ORDER — ONDANSETRON HCL 4 MG/2ML IJ SOLN
4.0000 mg | Freq: Once | INTRAMUSCULAR | Status: AC
  Administered 2012-10-14: 4 mg via INTRAVENOUS
  Filled 2012-10-14: qty 2

## 2012-10-14 MED ORDER — PANTOPRAZOLE SODIUM 40 MG IV SOLR
40.0000 mg | Freq: Every day | INTRAVENOUS | Status: DC
  Administered 2012-10-14 – 2012-10-15 (×2): 40 mg via INTRAVENOUS
  Filled 2012-10-14 (×3): qty 40

## 2012-10-14 MED ORDER — DIPHENHYDRAMINE HCL 12.5 MG/5ML PO ELIX: 12.5000 mg | ORAL_SOLUTION | Freq: Four times a day (QID) | ORAL | Status: DC | PRN

## 2012-10-14 MED ORDER — ONDANSETRON HCL 4 MG/2ML IJ SOLN
4.0000 mg | Freq: Four times a day (QID) | INTRAMUSCULAR | Status: DC | PRN
  Administered 2012-10-15 – 2012-10-17 (×2): 4 mg via INTRAVENOUS
  Filled 2012-10-14 (×2): qty 2

## 2012-10-14 MED ORDER — DIAZEPAM 5 MG PO TABS
10.0000 mg | ORAL_TABLET | Freq: Four times a day (QID) | ORAL | Status: DC | PRN
  Administered 2012-10-14 – 2012-10-17 (×6): 10 mg via ORAL
  Filled 2012-10-14 (×2): qty 2
  Filled 2012-10-14: qty 1
  Filled 2012-10-14 (×2): qty 2
  Filled 2012-10-14: qty 1
  Filled 2012-10-14: qty 2

## 2012-10-14 MED ORDER — ACETAMINOPHEN 650 MG RE SUPP: 650.0000 mg | Freq: Four times a day (QID) | RECTAL | Status: DC | PRN

## 2012-10-14 MED ORDER — KCL IN DEXTROSE-NACL 20-5-0.45 MEQ/L-%-% IV SOLN
INTRAVENOUS | Status: DC
  Administered 2012-10-14 – 2012-10-16 (×4): via INTRAVENOUS
  Administered 2012-10-17: 1000 mL via INTRAVENOUS
  Administered 2012-10-18: 06:00:00 via INTRAVENOUS
  Filled 2012-10-14 (×7): qty 1000

## 2012-10-14 MED ORDER — ALBUTEROL SULFATE HFA 108 (90 BASE) MCG/ACT IN AERS
2.0000 | INHALATION_SPRAY | RESPIRATORY_TRACT | Status: DC | PRN
  Filled 2012-10-14: qty 6.7

## 2012-10-14 MED ORDER — METOPROLOL SUCCINATE ER 50 MG PO TB24
50.0000 mg | ORAL_TABLET | Freq: Every day | ORAL | Status: DC
  Administered 2012-10-15 – 2012-10-18 (×4): 50 mg via ORAL
  Filled 2012-10-14 (×4): qty 1

## 2012-10-14 MED ORDER — LEVOTHYROXINE SODIUM 50 MCG PO TABS
50.0000 ug | ORAL_TABLET | Freq: Every day | ORAL | Status: DC
  Administered 2012-10-15 – 2012-10-18 (×4): 50 ug via ORAL
  Filled 2012-10-14 (×5): qty 1

## 2012-10-14 MED ORDER — ENOXAPARIN SODIUM 40 MG/0.4ML ~~LOC~~ SOLN
40.0000 mg | SUBCUTANEOUS | Status: DC
  Administered 2012-10-14 – 2012-10-17 (×4): 40 mg via SUBCUTANEOUS
  Filled 2012-10-14 (×7): qty 0.4

## 2012-10-14 MED ORDER — CITALOPRAM HYDROBROMIDE 20 MG PO TABS
20.0000 mg | ORAL_TABLET | Freq: Every day | ORAL | Status: DC
  Administered 2012-10-14 – 2012-10-18 (×5): 20 mg via ORAL
  Filled 2012-10-14 (×6): qty 1

## 2012-10-14 MED ORDER — LOSARTAN POTASSIUM 50 MG PO TABS
100.0000 mg | ORAL_TABLET | Freq: Two times a day (BID) | ORAL | Status: DC
  Administered 2012-10-15 – 2012-10-18 (×7): 100 mg via ORAL
  Filled 2012-10-14 (×8): qty 2

## 2012-10-14 NOTE — H&P (Signed)
Laura [name] is an 67 y.o. female.   Chief Complaint: nausea and vomiting HPI:  Pt is a 67 yo female who presents with worsening nausea and vomiting post op from lap chole.  She says she felt good the first 24 hours after surgery with no nausea and with tolerable post op pain, but she stated that she had nausea and vomiting after that every day.  She was given scripts for nausea that helped at first, but got to where they haven't helped at all.  She is no longer able to keep anything down.  She is also having diarrhea with every BM.  She describes this as non bloody and small volume.  She has not taken her temperature, but has had subjective sweats and shaking chills.  She is having minimal abdominal pain, if at all.    Past Medical History  Diagnosis Date  . Hypothyroidism   . Anemia   . Seasonal allergies   . Depression   . SUI (stress urinary incontinence, female)   . OSA (obstructive sleep apnea) MODERATE PER STUDY 2005    CPAP NONCOMPLIANT  . History of kidney stones   . Nonischemic dilated cardiomyopathy     MODERATELY DEPRESSED LVF;EF 35-45% by Echo 05/27/11  . LBBB (left bundle branch block)   . Left ventricular ejection fraction less than 40%     38% PER CARDIOLOGIST NOTE (DR CROITORU)  . Short of breath on exertion   . Chronic combined systolic and diastolic CHF, NYHA class 2 CARDIOLOGIST-  DR [id number]  . Hypertension   . History of non-ST elevation myocardial infarction (NSTEMI) JUNE 2011    SECONDARY TO TAKOTSUDO SYNDROME (CARDIAC CATH NORMAL)  . Hyperlipemia   . Anxiety   . Asthma     related to sesonal allergies    Past Surgical History  Procedure Laterality Date  . Nasal septum surgery  1980's  . Abdominal hysterectomy  1985    partial  . Cataract extraction w/ intraocular lens  implant, bilateral    . Right ureteroscopic stone extraction  08-31-2000  . Knee arthroscopy w/ meniscectomy  07-27-2011    MEDIAL AND LATERAL  . Transthoracic echocardiogram   05-27-2011  DR CROITORU    MODERATELY DEPRESSED LVF DUE TO GLOBAL HYPOKINESIS AND MARKED SYSTOLIC ASYNCHRONY/ EF 38%/ MILD LEFT ATRIAL DILATATION  . Cardiac catheterization  09-04-1999;  08/25/2004;   12/09/2009  DR CROITORU    NORMAL CORONARIES/  APICAL BALLOONING OF LV CONSISTENT WITH TAKOTSUBO SYMPTOMS/ EF 30-35%  . Pubovaginal sling N/A 09/19/2012    Procedure: SUBURETHRAL Elio Forget;  Surgeon: Valetta Fuller, MD;  Location: [facility];  Service: Urology;  Laterality: N/A;  . Cystoscopy N/A 09/19/2012    Procedure: CYSTOSCOPY FLEXIBLE;  Surgeon: Valetta Fuller, MD;  Location: [facility];  Service: Urology;  Laterality: N/A;  . Cholecystectomy N/A 09/29/2012    Procedure: LAPAROSCOPIC CHOLECYSTECTOMY WITH INTRAOPERATIVE CHOLANGIOGRAM;  Surgeon: Ardeth Sportsman, MD;  Location: [facility];  Service: General;  Laterality: N/A;    Family History  Problem Relation Age of Onset  . Pneumonia Mother   . Hypertension Mother   . Heart attack Father   . Fibromyalgia Brother   . Pulmonary embolism Brother   . Hypertension Brother    Social History:  reports that she has never smoked. She has never used smokeless tobacco. She reports that she does not drink alcohol or use illicit drugs.  Allergies:  Allergies  Allergen Reactions  .  Ace Inhibitors     unknown  . Fetzima (Levomilnacipran) Other (See Comments)    "Talking out of my head"  . Lasix (Furosemide) Other (See Comments)    HEADACHE  . Codeine Hives and Other (See Comments)    Headache    MedicationsLong-Term  Prescriptions Show Facility-Administered Medications    acetaminophen (TYLENOL) 325 MG tablet   atorvastatin (LIPITOR) 80 MG tablet   bumetanide (BUMEX) 1 MG tablet   citalopram (CELEXA) 20 MG tablet   diazepam (VALIUM) 10 MG tablet   estradiol (ESTRACE) 1 MG tablet   levothyroxine (SYNTHROID, LEVOTHROID) 50 MCG tablet   losartan-hydrochlorothiazide (HYZAAR) 100-25 MG per tablet   metoprolol  (TOPROL-XL) 50 MG 24 hr tablet   ondansetron (ZOFRAN) 4 MG tablet   oxyCODONE (OXY IR/ROXICODONE) 5 MG immediate release tablet   potassium chloride (K-DUR) 10 MEQ tablet   promethazine (PHENERGAN) 12.5 MG tablet   spironolactone (ALDACTONE) 25 MG tablet   albuterol (PROVENTIL HFA;VENTOLIN HFA) 108 (90 BASE) MCG/ACT inhaler   Armodafinil (NUVIGIL) 250 MG tablet   CALCIUM PO   Cholecalciferol (VITAMIN D3) 2000 UNITS capsule     Results for orders placed during the hospital encounter of 10/14/12 (from the past 48 hour(s))  LIPASE, BLOOD     Status: Abnormal   Collection Time    10/14/12  2:31 PM      Result Value Range   Lipase 89 (*) 11 - 59 U/L  CBC WITH DIFFERENTIAL     Status: Abnormal   Collection Time    10/14/12  2:32 PM      Result Value Range   WBC 9.3  4.0 - 10.5 K/uL   RBC 4.94  3.87 - 5.11 MIL/uL   Hemoglobin 12.4  12.0 - 15.0 g/dL   HCT 78.2  95.6 - 21.3 %   MCV 74.7 (*) 78.0 - 100.0 fL   MCH 25.1 (*) 26.0 - 34.0 pg   MCHC 33.6  30.0 - 36.0 g/dL   RDW 08.6  57.8 - 46.9 %   Platelets 376  150 - 400 K/uL   Neutrophils Relative 74  43 - 77 %   Lymphocytes Relative 15  12 - 46 %   Monocytes Relative 9  3 - 12 %   Eosinophils Relative 1  0 - 5 %   Basophils Relative 1  0 - 1 %   Neutro Abs 6.9  1.7 - 7.7 K/uL   Lymphs Abs 1.4  0.7 - 4.0 K/uL   Monocytes Absolute 0.8  0.1 - 1.0 K/uL   Eosinophils Absolute 0.1  0.0 - 0.7 K/uL   Basophils Absolute 0.1  0.0 - 0.1 K/uL   Smear Review MORPHOLOGY UNREMARKABLE    COMPREHENSIVE METABOLIC PANEL     Status: Abnormal   Collection Time    10/14/12  2:32 PM      Result Value Range   Sodium 137  135 - 145 mEq/L   Potassium 3.7  3.5 - 5.1 mEq/L   Chloride 101  96 - 112 mEq/L   CO2 28  19 - 32 mEq/L   Glucose, Bld 107 (*) 70 - 99 mg/dL   BUN 14  6 - 23 mg/dL   Creatinine, Ser 6.29  0.50 - 1.10 mg/dL   Calcium 9.2  8.4 - 52.8 mg/dL   Total Protein 6.9  6.0 - 8.3 g/dL   Albumin 3.4 (*) 3.5 - 5.2 g/dL   AST 19  0 - 37 U/L  ALT 19  0 - 35 U/L   Alkaline Phosphatase 85  39 - 117 U/L   Total Bilirubin 0.3  0.3 - 1.2 mg/dL   GFR calc non Af Amer 57 (*) >90 mL/min   GFR calc Af Amer 66 (*) >90 mL/min   Comment:            The eGFR has been calculated     using the CKD EPI equation.     This calculation has not been     validated in all clinical     situations.     eGFR's persistently     <90 mL/min signify     possible Chronic Kidney Disease.  URINALYSIS, ROUTINE W REFLEX MICROSCOPIC     Status: Abnormal   Collection Time    10/14/12  5:04 PM      Result Value Range   Color, Urine YELLOW  YELLOW   APPearance HAZY (*) CLEAR   Specific Gravity, Urine 1.011  1.005 - 1.030   pH 7.5  5.0 - 8.0   Glucose, UA NEGATIVE  NEGATIVE mg/dL   Hgb urine dipstick TRACE (*) NEGATIVE   Bilirubin Urine NEGATIVE  NEGATIVE   Ketones, ur NEGATIVE  NEGATIVE mg/dL   Protein, ur NEGATIVE  NEGATIVE mg/dL   Urobilinogen, UA 0.2  0.0 - 1.0 mg/dL   Nitrite NEGATIVE  NEGATIVE   Leukocytes, UA SMALL (*) NEGATIVE  URINE MICROSCOPIC-ADD ON     Status: Abnormal   Collection Time    10/14/12  5:04 PM      Result Value Range   Squamous Epithelial / LPF FEW (*) RARE   WBC, UA 11-20  <3 WBC/hpf   RBC / HPF 0-2  <3 RBC/hpf   Bacteria, UA FEW (*) RARE   Casts HYALINE CASTS (*) NEGATIVE   Dg Abd Acute W/chest  10/14/2012  *RADIOLOGY REPORT*  Clinical Data: Abdominal pain.  Status post cholecystectomy.  ACUTE ABDOMEN SERIES (ABDOMEN 2 VIEW & CHEST 1 VIEW)  Comparison: 09/28/2012  Findings:  Heart size is normal.  No pleural effusions or edema.  There is no airspace consolidation.  Bowel gas pattern appears nonobstructive.  There are no dilated loops of small bowel or air-fluid levels identified.  Cholecystectomy clips identified within the right upper quadrant of the abdomen.  Scoliosis deformity involves the lumbar spine.  There is associated multilevel degenerative disc disease.  IMPRESSION:  1.  No acute cardiopulmonary abnormalities.  2.  Nonobstructive bowel gas pattern.   Original Report Authenticated By: Signa Kell, M.D.     Review of Systems  Constitutional: Positive for fever and chills.  HENT: Negative.   Eyes: Negative.   Respiratory: Negative.   Cardiovascular: Positive for leg swelling.  Gastrointestinal: Positive for nausea, vomiting and diarrhea (every BM.  Small volume, non bloody).  Genitourinary: Negative.   Musculoskeletal: Negative.   Skin: Negative.   Neurological: Negative.   Endo/Heme/Allergies: Negative.   Psychiatric/Behavioral: Positive for depression.    Blood pressure 104/54, pulse 70, temperature 97.7 F (36.5 C), resp. rate 14, SpO2 98.00%. Physical Exam  Constitutional: She is oriented to person, place, and time. She appears well-developed and well-nourished. She appears distressed (looks uncomfortable and weak.  ).  HENT:  Head: Normocephalic and atraumatic.  Eyes: Conjunctivae are normal. Pupils are equal, round, and reactive to light. No scleral icterus.  Neck: Normal range of motion. Neck supple. No tracheal deviation present. No thyromegaly present.  Cardiovascular: Normal rate, regular rhythm and intact distal pulses.  Murmur heard. Respiratory: Effort normal and breath sounds normal. No respiratory distress. She has no wheezes. She has no rales. She exhibits no tenderness.  GI: Soft. Bowel sounds are normal. She exhibits no distension and no mass. Tenderness: mild tenderness. There is no rebound and no guarding.  Musculoskeletal: Normal range of motion. She exhibits no edema and no tenderness.  Neurological: She is alert and oriented to person, place, and time.  Skin: Skin is warm and dry. She is not diaphoretic.     Assessment/Plan Pancreatitis Unclear source, but likely from gallstone pre operatively.  Intraoperative cholangiogram was negative for stones, and LFTs are normal now.  Pancreatitis should have resolved by now.  Will get MRCP to rule out retained stone.  MR  should also show if there are residual fluid collections.   Will keep NPO with bowel rest.   If no prompt improvement, will need nutritional supplementation via panda or TNA.    Will keep on cardiac and anxiety meds.  Hold non essential meds. Follow up lipase in AM.   Kaj Vasil 10/14/2012, 6:11 PM

## 2012-10-14 NOTE — ED Provider Notes (Signed)
History     CSN: [id number]  Arrival date & time 10/14/12  1247   First MD Initiated Contact with Patient 10/14/12 1315      Chief Complaint  Patient presents with  . Emesis     HPI Pt states just had recent surgery for GB and bladder tack states has been n/v since.  No fever chills.  Mild epigastric tenderness. Has had anorexia. Past Medical History  Diagnosis Date  . Hypothyroidism   . Anemia   . Seasonal allergies   . Depression   . SUI (stress urinary incontinence, female)   . OSA (obstructive sleep apnea) MODERATE PER STUDY 2005    CPAP NONCOMPLIANT  . History of kidney stones   . Nonischemic dilated cardiomyopathy     MODERATELY DEPRESSED LVF;EF 35-45% by Echo 05/27/11  . LBBB (left bundle branch block)   . Left ventricular ejection fraction less than 40%     38% PER CARDIOLOGIST NOTE (DR CROITORU)  . Short of breath on exertion   . Chronic combined systolic and diastolic CHF, NYHA class 2 CARDIOLOGIST-  DR [id number]  . Hypertension   . History of non-ST elevation myocardial infarction (NSTEMI) JUNE 2011    SECONDARY TO TAKOTSUDO SYNDROME (CARDIAC CATH NORMAL)  . Hyperlipemia   . Anxiety   . Asthma     related to sesonal allergies    Past Surgical History  Procedure Laterality Date  . Nasal septum surgery  1980's  . Abdominal hysterectomy  1985    partial  . Cataract extraction w/ intraocular lens  implant, bilateral    . Right ureteroscopic stone extraction  08-31-2000  . Knee arthroscopy w/ meniscectomy  07-27-2011    MEDIAL AND LATERAL  . Transthoracic echocardiogram  05-27-2011  DR CROITORU    MODERATELY DEPRESSED LVF DUE TO GLOBAL HYPOKINESIS AND MARKED SYSTOLIC ASYNCHRONY/ EF 38%/ MILD LEFT ATRIAL DILATATION  . Cardiac catheterization  09-04-1999;  08/25/2004;   12/09/2009  DR CROITORU    NORMAL CORONARIES/  APICAL BALLOONING OF LV CONSISTENT WITH TAKOTSUBO SYMPTOMS/ EF 30-35%  . Pubovaginal sling N/A 09/19/2012    Procedure: SUBURETHRAL Elio Forget;   Surgeon: Valetta Fuller, MD;  Location: [facility];  Service: Urology;  Laterality: N/A;  . Cystoscopy N/A 09/19/2012    Procedure: CYSTOSCOPY FLEXIBLE;  Surgeon: Valetta Fuller, MD;  Location: [facility];  Service: Urology;  Laterality: N/A;  . Cholecystectomy N/A 09/29/2012    Procedure: LAPAROSCOPIC CHOLECYSTECTOMY WITH INTRAOPERATIVE CHOLANGIOGRAM;  Surgeon: Ardeth Sportsman, MD;  Location: [facility];  Service: General;  Laterality: N/A;    Family History  Problem Relation Age of Onset  . Pneumonia Mother   . Hypertension Mother   . Heart attack Father   . Fibromyalgia Brother   . Pulmonary embolism Brother   . Hypertension Brother     History  Substance Use Topics  . Smoking status: Never Smoker   . Smokeless tobacco: Never Used  . Alcohol Use: No    OB History   Grav Para Term Preterm Abortions TAB SAB Ect Mult Living                  Review of Systems All other systems reviewed and are negative Allergies  Ace inhibitors; Fetzima; Lasix; and Codeine  Home Medications   Current Outpatient Rx  Name  Route  Sig  Dispense  Refill  . acetaminophen (TYLENOL) 325 MG tablet   Oral   Take 162.5 mg by  mouth every 6 (six) hours as needed for pain (for headache.).         Marland Kitchen atorvastatin (LIPITOR) 80 MG tablet   Oral   Take 40 mg by mouth daily. AM         . bumetanide (BUMEX) 1 MG tablet   Oral   Take 1 mg by mouth daily. AM         . citalopram (CELEXA) 20 MG tablet   Oral   Take 20 mg by mouth daily.         . diazepam (VALIUM) 10 MG tablet   Oral   Take 10 mg by mouth every 6 (six) hours as needed for anxiety.         Marland Kitchen estradiol (ESTRACE) 1 MG tablet   Oral   Take 1 mg by mouth daily. AM         . levothyroxine (SYNTHROID, LEVOTHROID) 50 MCG tablet   Oral   Take 50 mcg by mouth daily. AM         . losartan-hydrochlorothiazide (HYZAAR) 100-25 MG per tablet   Oral   Take 1 tablet by mouth 2 (two) times daily.           . metoprolol (TOPROL-XL) 50 MG 24 hr tablet   Oral   Take 50 mg by mouth daily. AM         . ondansetron (ZOFRAN) 4 MG tablet   Oral   Take 4 mg by mouth every 8 (eight) hours as needed for nausea.         Marland Kitchen oxyCODONE (OXY IR/ROXICODONE) 5 MG immediate release tablet   Oral   Take 1-2 tablets (5-10 mg total) by mouth every 6 (six) hours as needed for pain.   40 tablet   0   . potassium chloride (K-DUR) 10 MEQ tablet   Oral   Take 10 mEq by mouth daily.         . promethazine (PHENERGAN) 12.5 MG tablet   Oral   Take 1-2 tablets (12.5-25 mg total) by mouth every 6 (six) hours as needed for nausea.   20 tablet   3   . spironolactone (ALDACTONE) 25 MG tablet   Oral   Take 25 mg by mouth daily.         Marland Kitchen albuterol (PROVENTIL HFA;VENTOLIN HFA) 108 (90 BASE) MCG/ACT inhaler   Inhalation   Inhale 2 puffs into the lungs as needed. Has not needed inhaler in 2 yrs.         . Armodafinil (NUVIGIL) 250 MG tablet   Oral   Take 250 mg by mouth daily. AM         . CALCIUM PO   Oral   Take 1 tablet by mouth daily.         . Cholecalciferol (VITAMIN D3) 2000 UNITS capsule   Oral   Take 2,000 Units by mouth daily.           BP 111/44  Pulse 68  Temp(Src) 98.3 F (36.8 C) (Oral)  Resp 16  SpO2 96%  Physical Exam  Nursing note and vitals reviewed. Constitutional: She is oriented to person, place, and time. She appears well-developed and well-nourished. No distress.  HENT:  Head: Normocephalic and atraumatic.  Eyes: Pupils are equal, round, and reactive to light.  Neck: Normal range of motion.  Cardiovascular: Normal rate and intact distal pulses.   Pulmonary/Chest: No respiratory distress.  Abdominal: Normal appearance. She exhibits no distension.  There is no tenderness. There is no rebound and no guarding.  Musculoskeletal: Normal range of motion.  Neurological: She is alert and oriented to person, place, and time. No cranial nerve deficit.  Skin:  Skin is warm and dry. No rash noted.  Psychiatric: She has a normal mood and affect. Her behavior is normal.    ED Course  Procedures (including critical care time) Meds ordered this encounter  Medications  . sodium chloride 0.9 % bolus 500 mL    Sig:   . ondansetron (ZOFRAN) injection 4 mg    Sig:     Labs Reviewed  CBC WITH DIFFERENTIAL - Abnormal; Notable for the following:    MCV 74.7 (*)    MCH 25.1 (*)    All other components within normal limits  COMPREHENSIVE METABOLIC PANEL - Abnormal; Notable for the following:    Glucose, Bld 107 (*)    Albumin 3.4 (*)    GFR calc non Af Amer 57 (*)    GFR calc Af Amer 66 (*)    All other components within normal limits  URINALYSIS, ROUTINE W REFLEX MICROSCOPIC - Abnormal; Notable for the following:    APPearance HAZY (*)    Hgb urine dipstick TRACE (*)    Leukocytes, UA SMALL (*)    All other components within normal limits  LIPASE, BLOOD - Abnormal; Notable for the following:    Lipase 89 (*)    All other components within normal limits  URINE MICROSCOPIC-ADD ON - Abnormal; Notable for the following:    Squamous Epithelial / LPF FEW (*)    Bacteria, UA FEW (*)    Casts HYALINE CASTS (*)    All other components within normal limits  URINE CULTURE   Dg Abd Acute W/chest  10/14/2012  *RADIOLOGY REPORT*  Clinical Data: Abdominal pain.  Status post cholecystectomy.  ACUTE ABDOMEN SERIES  .  IMPRESSION:  1.  No acute cardiopulmonary abnormalities. 2.  Nonobstructive bowel gas pattern.   Original Report Authenticated By: Signa Kell, M.D.      1. Pancreatitis   2. Nausea and vomiting       MDM  Consulted general surgery who will see the patient in emergency department        Nelia Shi, MD 10/14/12 [phone]

## 2012-10-14 NOTE — ED Notes (Signed)
Pt states just had recent surgery for GB and bladder tack states has been n/v since cannot seem to shake this

## 2012-10-15 ENCOUNTER — Observation Stay (HOSPITAL_COMMUNITY): Payer: Medicare Other

## 2012-10-15 DIAGNOSIS — K7689 Other specified diseases of liver: Secondary | ICD-10-CM | POA: Diagnosis not present

## 2012-10-15 LAB — PHOSPHORUS: Phosphorus: 3.5 mg/dL (ref 2.3–4.6)

## 2012-10-15 LAB — PROTIME-INR
INR: 1.15 (ref 0.00–1.49)
Prothrombin Time: 14.5 seconds (ref 11.6–15.2)

## 2012-10-15 LAB — CBC
Hemoglobin: 12.5 g/dL (ref 12.0–15.0)
MCHC: 33.3 g/dL (ref 30.0–36.0)
RBC: 5 MIL/uL (ref 3.87–5.11)
WBC: 6 10*3/uL (ref 4.0–10.5)

## 2012-10-15 LAB — COMPREHENSIVE METABOLIC PANEL
AST: 23 U/L (ref 0–37)
Albumin: 3.4 g/dL — ABNORMAL LOW (ref 3.5–5.2)
BUN: 13 mg/dL (ref 6–23)
Calcium: 9.5 mg/dL (ref 8.4–10.5)
Creatinine, Ser: 0.93 mg/dL (ref 0.50–1.10)
Total Protein: 7 g/dL (ref 6.0–8.3)

## 2012-10-15 LAB — MAGNESIUM: Magnesium: 2.2 mg/dL (ref 1.5–2.5)

## 2012-10-15 MED ORDER — GADOBENATE DIMEGLUMINE 529 MG/ML IV SOLN
18.0000 mL | Freq: Once | INTRAVENOUS | Status: AC | PRN
  Administered 2012-10-15: 18 mL via INTRAVENOUS

## 2012-10-15 NOTE — Progress Notes (Signed)
Patient ID: Laura [name], female   DOB: 1946-01-28, 67 y.o.   MRN: [id number]    Subjective: Still nauseated this morning. No abdominal pain. No diarrhea or other GI complaints.  Objective: Vital signs in last 24 hours: Temp:  [97.7 F (36.5 C)-98.7 F (37.1 C)] 98.3 F (36.8 C) (04/19 0518) Pulse Rate:  [68-93] 70 (04/19 0518) Resp:  [14-20] 18 (04/19 0518) BP: (93-123)/(44-65) 103/48 mmHg (04/19 0518) SpO2:  [92 %-98 %] 96 % (04/19 0518) Weight:  [182 lb 12.2 oz (82.9 kg)] 182 lb 12.2 oz (82.9 kg) (04/18 2042) Last BM Date: 10/14/12  Intake/Output from previous day: 04/18 0701 - 04/19 0700 In: 615 [I.V.:615] Out: 300 [Urine:300] Intake/Output this shift:    General appearance: alert, cooperative and mild distress GI: soft and nontender. Nondistended. Incision healing well.  Lab Results:   Recent Labs  10/14/12 2045 10/15/12 0554  WBC 7.4 6.0  HGB 13.0 12.5  HCT 38.9 37.5  PLT 414* 395   BMET  Recent Labs  10/14/12 1432 10/14/12 2045 10/15/12 0554  NA 137  --  136  K 3.7  --  3.5  CL 101  --  99  CO2 28  --  28  GLUCOSE 107*  --  116*  BUN 14  --  13  CREATININE 1.00 1.04 0.93  CALCIUM 9.2  --  9.5   Lipase     Component Value Date/Time   LIPASE 89* 10/14/2012 1431     Studies/Results: Dg Abd Acute W/chest  10/14/2012  *RADIOLOGY REPORT*  Clinical Data: Abdominal pain.  Status post cholecystectomy.  ACUTE ABDOMEN SERIES (ABDOMEN 2 VIEW & CHEST 1 VIEW)  Comparison: 09/28/2012  Findings:  Heart size is normal.  No pleural effusions or edema.  There is no airspace consolidation.  Bowel gas pattern appears nonobstructive.  There are no dilated loops of small bowel or air-fluid levels identified.  Cholecystectomy clips identified within the right upper quadrant of the abdomen.  Scoliosis deformity involves the lumbar spine.  There is associated multilevel degenerative disc disease.  IMPRESSION:  1.  No acute cardiopulmonary abnormalities. 2.  Nonobstructive  bowel gas pattern.   Original Report Authenticated By: Signa Kell, M.D.     Anti-infectives: Anti-infectives   None      Assessment/Plan: 3 weeks of persistent nausea following laparoscopic cholecystectomy. Her only abnormality on workup has a mildly elevated lipase. Question mild pancreatitis? Possible gastroparesis or gastritis. MRI is ordered. If workup is unrevealing and symptoms persist would consider upper endoscopy and gastric emptying study. Discussed with patient and her husband.    LOS: 1 day    Gabreille Dardis T 10/15/2012

## 2012-10-15 NOTE — Progress Notes (Signed)
Utilization Review Completed.   Geoffrey Mankin, RN, BSN Nurse Case Manager  [phone]  

## 2012-10-15 NOTE — Progress Notes (Signed)
INITIAL NUTRITION ASSESSMENT  DOCUMENTATION CODES Per approved criteria  -Obesity Unspecified -Severe malnutrition in the context of acute illness   INTERVENTION:  Recommend diet advancement as medically appropriate to Low Fat diet. May need PO supplements to help meet nutrition needs.  If unable to start PO diet within the next 48 hours, consider nutrition support (Jejunal TF vs. TPN).  NUTRITION DIAGNOSIS: Malnutrition related to inadequate oral intake as evidenced by 9% weight loss in the past month and intake </= 50% of estimated energy requirement for >/= 5 days.  Goal: Intake to meet >90% of estimated nutrition needs.  Monitor:  Diet advancement, PO intake, labs, weight trend.  Reason for Assessment: MST=4  67 y.o. female  Admitting Dx: Worsening nausea and vomiting  ASSESSMENT: Patient presented with worsening nausea and vomiting post op from lap chole. Patient did well without N/V the first 24 hours after surgery, but developed N/V and diarrhea over the past three weeks. Medications for nausea are no longer helping. Minimal intake for the past 3 weeks. Still with nausea this morning, but no abdominal pain, vomiting, or diarrhea. Possible pancreatitis, gastroparesis, or gastritis. MRI has been ordered.  Pt meets criteria for severe MALNUTRITION in the context of acute illness as evidenced by 9% weight loss in the past month and intake </= 50% of estimated energy requirement for >/= 5 days.   Height: Ht Readings from Last 1 Encounters:  10/14/12 5\' 4"  (1.626 m)    Weight: Wt Readings from Last 1 Encounters:  10/14/12 182 lb 12.2 oz (82.9 kg)    Ideal Body Weight: 54.5 kg  % Ideal Body Weight: 152%  Wt Readings from Last 10 Encounters:  10/14/12 182 lb 12.2 oz (82.9 kg)  09/28/12 194 lb (87.998 kg)  09/28/12 194 lb (87.998 kg)  09/19/12 199 lb 8 oz (90.493 kg)  09/19/12 199 lb 8 oz (90.493 kg)  04/23/12 185 lb (83.915 kg)  07/21/11 194 lb (87.998 kg)   07/21/11 194 lb (87.998 kg)  06/18/11 191 lb 9.3 oz (86.9 kg)    Usual Body Weight: 199 lb (3-4 weeks ago)  % Usual Body Weight: 91%  BMI:  Body mass index is 31.36 kg/(m^2). obese, class 1  Estimated Nutritional Needs: Kcal: 1650-1850 Protein: 85-95 gm Fluid: 1.7-1.9 L  Skin: no problems noted  Diet Order: NPO  EDUCATION NEEDS: -Education not appropriate at this time   Intake/Output Summary (Last 24 hours) at 10/15/12 1046 Last data filed at 10/15/12 0700  Gross per 24 hour  Intake    615 ml  Output    300 ml  Net    315 ml    Last BM: 4/18   Labs:   Recent Labs Lab 10/14/12 1432 10/14/12 2045 10/15/12 0554  NA 137  --  136  K 3.7  --  3.5  CL 101  --  99  CO2 28  --  28  BUN 14  --  13  CREATININE 1.00 1.04 0.93  CALCIUM 9.2  --  9.5  MG  --   --  2.2  PHOS  --   --  3.5  GLUCOSE 107*  --  116*    CBG (last 3)  No results found for this basename: GLUCAP,  in the last 72 hours  Scheduled Meds: . Armodafinil  250 mg Oral Daily  . atorvastatin  40 mg Oral Daily  . citalopram  20 mg Oral Daily  . enoxaparin (LOVENOX) injection  40 mg Subcutaneous Q24H  .  losartan  100 mg Oral BID   And  . hydrochlorothiazide  25 mg Oral BID  . levothyroxine  50 mcg Oral QAC breakfast  . metoprolol succinate  50 mg Oral Daily  . pantoprazole (PROTONIX) IV  40 mg Intravenous QHS  . spironolactone  25 mg Oral Daily    Continuous Infusions: . dextrose 5 % and 0.45 % NaCl with KCl 20 mEq/L 75 mL/hr at 10/14/12 2148    Past Medical History  Diagnosis Date  . Hypothyroidism   . Anemia   . Seasonal allergies   . Depression   . SUI (stress urinary incontinence, female)   . OSA (obstructive sleep apnea) MODERATE PER STUDY 2005    CPAP NONCOMPLIANT  . History of kidney stones   . Nonischemic dilated cardiomyopathy     MODERATELY DEPRESSED LVF;EF 35-45% by Echo 05/27/11  . LBBB (left bundle branch block)   . Left ventricular ejection fraction less than 40%      38% PER CARDIOLOGIST NOTE (DR CROITORU)  . Short of breath on exertion   . Chronic combined systolic and diastolic CHF, NYHA class 2 CARDIOLOGIST-  DR ZOXWRUEA  . Hypertension   . History of non-ST elevation myocardial infarction (NSTEMI) JUNE 2011    SECONDARY TO TAKOTSUDO SYNDROME (CARDIAC CATH NORMAL)  . Hyperlipemia   . Anxiety   . Asthma     related to sesonal allergies    Past Surgical History  Procedure Laterality Date  . Nasal septum surgery  1980's  . Abdominal hysterectomy  1985    partial  . Cataract extraction w/ intraocular lens  implant, bilateral    . Right ureteroscopic stone extraction  08-31-2000  . Knee arthroscopy w/ meniscectomy  07-27-2011    MEDIAL AND LATERAL  . Transthoracic echocardiogram  05-27-2011  DR CROITORU    MODERATELY DEPRESSED LVF DUE TO GLOBAL HYPOKINESIS AND MARKED SYSTOLIC ASYNCHRONY/ EF 38%/ MILD LEFT ATRIAL DILATATION  . Cardiac catheterization  09-04-1999;  08/25/2004;   12/09/2009  DR CROITORU    NORMAL CORONARIES/  APICAL BALLOONING OF LV CONSISTENT WITH TAKOTSUBO SYMPTOMS/ EF 30-35%  . Pubovaginal sling N/A 09/19/2012    Procedure: SUBURETHRAL Elio Forget;  Surgeon: Valetta Fuller, MD;  Location: [facility];  Service: Urology;  Laterality: N/A;  . Cystoscopy N/A 09/19/2012    Procedure: CYSTOSCOPY FLEXIBLE;  Surgeon: Valetta Fuller, MD;  Location: [facility];  Service: Urology;  Laterality: N/A;  . Cholecystectomy N/A 09/29/2012    Procedure: LAPAROSCOPIC CHOLECYSTECTOMY WITH INTRAOPERATIVE CHOLANGIOGRAM;  Surgeon: Ardeth Sportsman, MD;  Location: [facility] OR;  Service: General;  Laterality: N/A;    Joaquin Courts, RD, LDN, CNSC Pager [phone] After Hours Pager [phone]

## 2012-10-16 LAB — URINE CULTURE
Colony Count: NO GROWTH
Culture: NO GROWTH

## 2012-10-16 LAB — LIPASE, BLOOD: Lipase: 94 U/L — ABNORMAL HIGH (ref 11–59)

## 2012-10-16 MED ORDER — PANTOPRAZOLE SODIUM 40 MG PO TBEC
40.0000 mg | DELAYED_RELEASE_TABLET | Freq: Two times a day (BID) | ORAL | Status: DC
  Administered 2012-10-16 – 2012-10-18 (×5): 40 mg via ORAL
  Filled 2012-10-16 (×4): qty 1

## 2012-10-16 NOTE — Progress Notes (Signed)
Subjective: Clinically doing better. Tolerating clear liquids, a fair amount. No more nausea or vomiting. Denies pain.  She was upset that no one discussed the MRCP with her. I spent some time going over this.  MRCP looks good. No evidence of pancreatitis. CBD 7 mm.No evidence of surgical complication.  Objective: Vital signs in last 24 hours: Temp:  [97.8 F (36.6 C)-98.3 F (36.8 C)] 97.8 F (36.6 C) (04/20 0548) Pulse Rate:  [60-76] 67 (04/20 0548) Resp:  [18] 18 (04/20 0548) BP: (93-110)/(53-67) 93/53 mmHg (04/20 0548) SpO2:  [95 %-98 %] 95 % (04/20 0548) Last BM Date: 10/15/12  Intake/Output from previous day: 04/19 0701 - 04/20 0700 In: 1892 [P.O.:710; I.V.:1182] Out: -  Intake/Output this shift:    General appearance: alert. Stable. In no distress. Mental status normal. Husband in room. GI: Abdomen is soft. Not distended. Nontender. Normal bowel sounds. Wounds looked fine.  Lab Results:   Recent Labs  10/14/12 2045 10/15/12 0554  WBC 7.4 6.0  HGB 13.0 12.5  HCT 38.9 37.5  PLT 414* 395   BMET  Recent Labs  10/14/12 1432 10/14/12 2045 10/15/12 0554  NA 137  --  136  K 3.7  --  3.5  CL 101  --  99  CO2 28  --  28  GLUCOSE 107*  --  116*  BUN 14  --  13  CREATININE 1.00 1.04 0.93  CALCIUM 9.2  --  9.5   PT/INR  Recent Labs  10/15/12 0554  LABPROT 14.5  INR 1.15   ABG No results found for this basename: PHART, PCO2, PO2, HCO3,  in the last 72 hours  Studies/Results: Mr 3d Recon At Scanner  10/15/2012  *RADIOLOGY REPORT*  Clinical Data:  Status post laparoscopic cholecystectomy, evaluate for retained common duct stone or peripancreatic fluid collection  MRI ABDOMEN WITHOUT AND WITH CONTRAST (INCLUDING MRCP)  Technique:  Multiplanar multisequence MR imaging of the abdomen was performed both before and after the administration of intravenous contrast. Heavily T2-weighted images of the biliary and pancreatic ducts were obtained, and  three-dimensional MRCP images were rendered by post processing.  Contrast: 18mL MULTIHANCE GADOBENATE DIMEGLUMINE 529 MG/ML IV SOLN  Comparison:  None.  Findings:  Liver is notable for suspected postsurgical changes along the gallbladder fossa (series 1603/image 50).  No suspicious/enhancing hepatic lesions.  No hepatic steatosis.  Spleen and adrenal glands are within normal limits.  Pancreas is within normal limits.  Specifically, no peripancreatic stranding/fluid collection to suggest acute pancreatitis.  Status post cholecystectomy.  No intrahepatic or extrahepatic ductal dilatation.  Common duct measures 7 mm.  No choledocholithiasis is seen.  Kidneys are within normal limits.  No hydronephrosis.  No abdominal ascites.  No suspicious abdominal lymphadenopathy.  No focal osseous lesions.  IMPRESSION: Status post cholecystectomy.  Common duct measures 7 mm.  No choledocholithiasis is seen.  Liver is notable for suspected postsurgical changes along the gallbladder fossa.  No MR findings to suggest acute pancreatitis.   Original Report Authenticated By: Charline Bills, M.D.    Dg Abd Acute W/chest  10/14/2012  *RADIOLOGY REPORT*  Clinical Data: Abdominal pain.  Status post cholecystectomy.  ACUTE ABDOMEN SERIES (ABDOMEN 2 VIEW & CHEST 1 VIEW)  Comparison: 09/28/2012  Findings:  Heart size is normal.  No pleural effusions or edema.  There is no airspace consolidation.  Bowel gas pattern appears nonobstructive.  There are no dilated loops of small bowel or air-fluid levels identified.  Cholecystectomy clips identified within the right  upper quadrant of the abdomen.  Scoliosis deformity involves the lumbar spine.  There is associated multilevel degenerative disc disease.  IMPRESSION:  1.  No acute cardiopulmonary abnormalities. 2.  Nonobstructive bowel gas pattern.   Original Report Authenticated By: Signa Kell, M.D.    Mr Abd W/wo Cm/mrcp  10/15/2012  *RADIOLOGY REPORT*  Clinical Data:  Status post  laparoscopic cholecystectomy, evaluate for retained common duct stone or peripancreatic fluid collection  MRI ABDOMEN WITHOUT AND WITH CONTRAST (INCLUDING MRCP)  Technique:  Multiplanar multisequence MR imaging of the abdomen was performed both before and after the administration of intravenous contrast. Heavily T2-weighted images of the biliary and pancreatic ducts were obtained, and three-dimensional MRCP images were rendered by post processing.  Contrast: 18mL MULTIHANCE GADOBENATE DIMEGLUMINE 529 MG/ML IV SOLN  Comparison:  None.  Findings:  Liver is notable for suspected postsurgical changes along the gallbladder fossa (series 1603/image 50).  No suspicious/enhancing hepatic lesions.  No hepatic steatosis.  Spleen and adrenal glands are within normal limits.  Pancreas is within normal limits.  Specifically, no peripancreatic stranding/fluid collection to suggest acute pancreatitis.  Status post cholecystectomy.  No intrahepatic or extrahepatic ductal dilatation.  Common duct measures 7 mm.  No choledocholithiasis is seen.  Kidneys are within normal limits.  No hydronephrosis.  No abdominal ascites.  No suspicious abdominal lymphadenopathy.  No focal osseous lesions.  IMPRESSION: Status post cholecystectomy.  Common duct measures 7 mm.  No choledocholithiasis is seen.  Liver is notable for suspected postsurgical changes along the gallbladder fossa.  No MR findings to suggest acute pancreatitis.   Original Report Authenticated By: Charline Bills, M.D.     Anti-infectives: Anti-infectives   None      Assessment/Plan:  Essentially painless nausea and vomiting 3 weeks postop laparoscopic cholecystectomy. Only abnormality is borderline elevated lipase. MRCP normal. We don't have a good explanation for this, but her symptoms seem to be resolving.  Will proceed with trial of feeding with a low-fat diet. If she becomes symptomatic again that she will probably need a foregut evaluation such as EGD, upper  GI, gastric emptying study. If she remains asymptomatic, she can probably be discharged tomorrow.  Just to be sure, I have ordered one more round of lab work tomorrow after feeding.   LOS: 2 days    Karen Huhta M 10/16/2012

## 2012-10-17 DIAGNOSIS — R110 Nausea: Secondary | ICD-10-CM | POA: Diagnosis not present

## 2012-10-17 DIAGNOSIS — R109 Unspecified abdominal pain: Secondary | ICD-10-CM | POA: Diagnosis not present

## 2012-10-17 DIAGNOSIS — R6881 Early satiety: Secondary | ICD-10-CM | POA: Diagnosis not present

## 2012-10-17 LAB — CBC
MCV: 74.2 fL — ABNORMAL LOW (ref 78.0–100.0)
Platelets: 366 10*3/uL (ref 150–400)
RDW: 15.1 % (ref 11.5–15.5)
WBC: 7 10*3/uL (ref 4.0–10.5)

## 2012-10-17 LAB — COMPREHENSIVE METABOLIC PANEL
ALT: 22 U/L (ref 0–35)
AST: 23 U/L (ref 0–37)
Albumin: 3.5 g/dL (ref 3.5–5.2)
CO2: 28 mEq/L (ref 19–32)
Calcium: 9.3 mg/dL (ref 8.4–10.5)
Chloride: 99 mEq/L (ref 96–112)
Creatinine, Ser: 0.78 mg/dL (ref 0.50–1.10)
GFR calc non Af Amer: 85 mL/min — ABNORMAL LOW (ref >90)
Sodium: 136 mEq/L (ref 135–145)

## 2012-10-17 LAB — LIPASE, BLOOD: Lipase: 120 U/L — ABNORMAL HIGH (ref 11–59)

## 2012-10-17 MED ORDER — ONDANSETRON HCL 4 MG PO TABS
4.0000 mg | ORAL_TABLET | Freq: Two times a day (BID) | ORAL | Status: DC
Start: 2012-10-17 — End: 2012-10-17
  Administered 2012-10-17: 4 mg via ORAL
  Filled 2012-10-17: qty 1

## 2012-10-17 MED ORDER — ONDANSETRON HCL 4 MG/2ML IJ SOLN
4.0000 mg | Freq: Four times a day (QID) | INTRAMUSCULAR | Status: DC
  Administered 2012-10-18 (×3): 4 mg via INTRAVENOUS
  Filled 2012-10-17 (×3): qty 2

## 2012-10-17 NOTE — Progress Notes (Signed)
Patient ID: Laura [name], female   DOB: [birth year], 67 y.o.   MRN: [id number]    Subjective: Unfortunately her symptoms have returned this am, she feels miserable, does not want to eat due to nausea, denies pain, very frustrated at this point.  States if she needs to see GI then she would like Dr. Dulce Sellar from [facility], her mother and sister have seen him.  MRCP looks good. No evidence of pancreatitis. CBD 7 mm.No evidence of surgical complication.  Objective: Vital signs in last 24 hours: Temp:  [97.6 F (36.4 C)-98.3 F (36.8 C)] 97.6 F (36.4 C) (04/21 0558) Pulse Rate:  [66-76] 76 (04/21 0558) Resp:  [18-20] 18 (04/21 0558) BP: (101-116)/(55-65) 101/55 mmHg (04/21 0558) SpO2:  [94 %-96 %] 96 % (04/21 0558) Last BM Date: 10/16/12  Intake/Output from previous day: 04/20 0701 - 04/21 0700 In: 1560 [P.O.:810; I.V.:750] Out: -  Intake/Output this shift:    General appearance: alert. Stable. Tearful,  Mental status normal. Husband in room. GI: Abdomen is soft. nondistended. Normal bowel sounds. Incisions fine.  Lab Results:   Recent Labs  10/15/12 0554 10/17/12 0553  WBC 6.0 7.0  HGB 12.5 12.7  HCT 37.5 37.9  PLT 395 366   BMET  Recent Labs  10/15/12 0554 10/17/12 0553  NA 136 136  K 3.5 3.1*  CL 99 99  CO2 28 28  GLUCOSE 116* 110*  BUN 13 8  CREATININE 0.93 0.78  CALCIUM 9.5 9.3   PT/INR  Recent Labs  10/15/12 0554  LABPROT 14.5  INR 1.15   ABG No results found for this basename: PHART, PCO2, PO2, HCO3,  in the last 72 hours  Studies/Results: Mr 3d Recon At Scanner  10/15/2012  *RADIOLOGY REPORT*  Clinical Data:  Status post laparoscopic cholecystectomy, evaluate for retained common duct stone or peripancreatic fluid collection  MRI ABDOMEN WITHOUT AND WITH CONTRAST (INCLUDING MRCP)  Technique:  Multiplanar multisequence MR imaging of the abdomen was performed both before and after the administration of intravenous contrast. Heavily T2-weighted  images of the biliary and pancreatic ducts were obtained, and three-dimensional MRCP images were rendered by post processing.  Contrast: 18mL MULTIHANCE GADOBENATE DIMEGLUMINE 529 MG/ML IV SOLN  Comparison:  None.  Findings:  Liver is notable for suspected postsurgical changes along the gallbladder fossa (series 1603/image 50).  No suspicious/enhancing hepatic lesions.  No hepatic steatosis.  Spleen and adrenal glands are within normal limits.  Pancreas is within normal limits.  Specifically, no peripancreatic stranding/fluid collection to suggest acute pancreatitis.  Status post cholecystectomy.  No intrahepatic or extrahepatic ductal dilatation.  Common duct measures 7 mm.  No choledocholithiasis is seen.  Kidneys are within normal limits.  No hydronephrosis.  No abdominal ascites.  No suspicious abdominal lymphadenopathy.  No focal osseous lesions.  IMPRESSION: Status post cholecystectomy.  Common duct measures 7 mm.  No choledocholithiasis is seen.  Liver is notable for suspected postsurgical changes along the gallbladder fossa.  No MR findings to suggest acute pancreatitis.   Original Report Authenticated By: Charline Bills, M.D.    Mr Abd W/wo Cm/mrcp  10/15/2012  *RADIOLOGY REPORT*  Clinical Data:  Status post laparoscopic cholecystectomy, evaluate for retained common duct stone or peripancreatic fluid collection  MRI ABDOMEN WITHOUT AND WITH CONTRAST (INCLUDING MRCP)  Technique:  Multiplanar multisequence MR imaging of the abdomen was performed both before and after the administration of intravenous contrast. Heavily T2-weighted images of the biliary and pancreatic ducts were obtained, and three-dimensional MRCP images  were rendered by post processing.  Contrast: 18mL MULTIHANCE GADOBENATE DIMEGLUMINE 529 MG/ML IV SOLN  Comparison:  None.  Findings:  Liver is notable for suspected postsurgical changes along the gallbladder fossa (series 1603/image 50).  No suspicious/enhancing hepatic lesions.  No  hepatic steatosis.  Spleen and adrenal glands are within normal limits.  Pancreas is within normal limits.  Specifically, no peripancreatic stranding/fluid collection to suggest acute pancreatitis.  Status post cholecystectomy.  No intrahepatic or extrahepatic ductal dilatation.  Common duct measures 7 mm.  No choledocholithiasis is seen.  Kidneys are within normal limits.  No hydronephrosis.  No abdominal ascites.  No suspicious abdominal lymphadenopathy.  No focal osseous lesions.  IMPRESSION: Status post cholecystectomy.  Common duct measures 7 mm.  No choledocholithiasis is seen.  Liver is notable for suspected postsurgical changes along the gallbladder fossa.  No MR findings to suggest acute pancreatitis.   Original Report Authenticated By: Charline Bills, M.D.     Anti-infectives: Anti-infectives   None      Assessment/Plan:  Essentially painless nausea and vomiting 3 weeks postop laparoscopic cholecystectomy. Only abnormality is borderline elevated lipase. MRCP normal.   Did not do well with diet and her nausea has returned, she is very frustrated by this, per Dr. Derrell Lolling yesterday 'If she becomes symptomatic again that she will probably need a foregut evaluation such as EGD, upper GI, gastric emptying study'.  Also her lipase is elevated more today to 120 from 90, will make NPO for now and talk with Dr. Lindie Spruce about next step..   LOS: 3 days    WHITE, ELIZABETH 10/17/2012

## 2012-10-17 NOTE — Consult Note (Signed)
[facility] Consultation Note  Referring Provider: Frederik Schmidt, MD (CCS) Primary Care Physician:  Nadean Corwin, MD  Reason for Consultation:  nausea  HPI: Laura [name] is a 67 y.o. female with about 3 months of post-prandial abdominal pain and nausea.  Was ultimately diagnosed with symptomatic cholelithiasis and had cholecystectomy about three weeks ago.  Was discharged home, but, upon discharge began having nausea.  Nausea usually occurs 30-45 minutes after eating. The pre-surgical abdominal pain she was having, has resolved post-cholecystectomy.  No vomiting, but does have early satiety.  Has lost over 20 lbs in the past several weeks.  LFTs normal.  Mild elevation in lipase. Recent MRI/MRCP showed no pancreatitis and no evidence of choledocholithiasis.  Recent bowel movements.  No blood in stool.  Remote dysphagia, resolved inexplicably after cholecystectomy.  Had colonoscopy May 2012 showing sigmoid diverticulosis, otherwise normal.  No prior endoscopy.  Past Medical History  Diagnosis Date  . Hypothyroidism   . Anemia   . Seasonal allergies   . Depression   . SUI (stress urinary incontinence, female)   . OSA (obstructive sleep apnea) MODERATE PER STUDY 2005    CPAP NONCOMPLIANT  . History of kidney stones   . Nonischemic dilated cardiomyopathy     MODERATELY DEPRESSED LVF;EF 35-45% by Echo 05/27/11  . LBBB (left bundle branch block)   . Left ventricular ejection fraction less than 40%     38% PER CARDIOLOGIST NOTE (DR CROITORU)  . Short of breath on exertion   . Chronic combined systolic and diastolic CHF, NYHA class 2 CARDIOLOGIST-  DR ZOXWRUEA  . Hypertension   . History of non-ST elevation myocardial infarction (NSTEMI) JUNE 2011    SECONDARY TO TAKOTSUDO SYNDROME (CARDIAC CATH NORMAL)  . Hyperlipemia   . Anxiety   . Asthma     related to sesonal allergies    Past Surgical History  Procedure Laterality Date  . Nasal septum surgery  1980's  .  Abdominal hysterectomy  1985    partial  . Cataract extraction w/ intraocular lens  implant, bilateral    . Right ureteroscopic stone extraction  08-31-2000  . Knee arthroscopy w/ meniscectomy  07-27-2011    MEDIAL AND LATERAL  . Transthoracic echocardiogram  05-27-2011  DR CROITORU    MODERATELY DEPRESSED LVF DUE TO GLOBAL HYPOKINESIS AND MARKED SYSTOLIC ASYNCHRONY/ EF 38%/ MILD LEFT ATRIAL DILATATION  . Cardiac catheterization  09-04-1999;  08/25/2004;   12/09/2009  DR CROITORU    NORMAL CORONARIES/  APICAL BALLOONING OF LV CONSISTENT WITH TAKOTSUBO SYMPTOMS/ EF 30-35%  . Pubovaginal sling N/A 09/19/2012    Procedure: SUBURETHRAL Elio Forget;  Surgeon: Valetta Fuller, MD;  Location: [facility];  Service: Urology;  Laterality: N/A;  . Cystoscopy N/A 09/19/2012    Procedure: CYSTOSCOPY FLEXIBLE;  Surgeon: Valetta Fuller, MD;  Location: [facility];  Service: Urology;  Laterality: N/A;  . Cholecystectomy N/A 09/29/2012    Procedure: LAPAROSCOPIC CHOLECYSTECTOMY WITH INTRAOPERATIVE CHOLANGIOGRAM;  Surgeon: Ardeth Sportsman, MD;  Location: [facility];  Service: General;  Laterality: N/A;    Prior to Admission medications   Medication Sig Start Date End Date Taking? Authorizing Provider  acetaminophen (TYLENOL) 325 MG tablet Take 162.5 mg by mouth every 6 (six) hours as needed for pain (for headache.).   Yes Historical Provider, MD  atorvastatin (LIPITOR) 80 MG tablet Take 40 mg by mouth daily. AM   Yes Historical Provider, MD  bumetanide (BUMEX) 1 MG tablet Take 1  mg by mouth daily. AM   Yes Historical Provider, MD  citalopram (CELEXA) 20 MG tablet Take 20 mg by mouth daily.   Yes Historical Provider, MD  diazepam (VALIUM) 10 MG tablet Take 10 mg by mouth every 6 (six) hours as needed for anxiety.   Yes Historical Provider, MD  estradiol (ESTRACE) 1 MG tablet Take 1 mg by mouth daily. AM   Yes Historical Provider, MD  levothyroxine (SYNTHROID, LEVOTHROID) 50 MCG tablet Take  50 mcg by mouth daily. AM   Yes Historical Provider, MD  losartan-hydrochlorothiazide (HYZAAR) 100-25 MG per tablet Take 1 tablet by mouth 2 (two) times daily.    Yes Historical Provider, MD  metoprolol (TOPROL-XL) 50 MG 24 hr tablet Take 50 mg by mouth daily. AM   Yes Historical Provider, MD  ondansetron (ZOFRAN) 4 MG tablet Take 4 mg by mouth every 8 (eight) hours as needed for nausea.   Yes Historical Provider, MD  oxyCODONE (OXY IR/ROXICODONE) 5 MG immediate release tablet Take 1-2 tablets (5-10 mg total) by mouth every 6 (six) hours as needed for pain. 10/01/12  Yes Megan Dort, PA-C  potassium chloride (K-DUR) 10 MEQ tablet Take 10 mEq by mouth daily.   Yes Nada Boozer, NP  promethazine (PHENERGAN) 12.5 MG tablet Take 1-2 tablets (12.5-25 mg total) by mouth every 6 (six) hours as needed for nausea. 09/29/12  Yes Ardeth Sportsman, MD  spironolactone (ALDACTONE) 25 MG tablet Take 25 mg by mouth daily.   Yes Historical Provider, MD  albuterol (PROVENTIL HFA;VENTOLIN HFA) 108 (90 BASE) MCG/ACT inhaler Inhale 2 puffs into the lungs as needed. Has not needed inhaler in 2 yrs.    Historical Provider, MD  Armodafinil (NUVIGIL) 250 MG tablet Take 250 mg by mouth daily. AM    Historical Provider, MD  CALCIUM PO Take 1 tablet by mouth daily.    Historical Provider, MD  Cholecalciferol (VITAMIN D3) 2000 UNITS capsule Take 2,000 Units by mouth daily.    Historical Provider, MD    Current Facility-Administered Medications  Medication Dose Route Frequency Provider Last Rate Last Dose  . acetaminophen (TYLENOL) tablet 650 mg  650 mg Oral Q6H PRN Almond Lint, MD   650 mg at 10/15/12 0947   Or  . acetaminophen (TYLENOL) suppository 650 mg  650 mg Rectal Q6H PRN Almond Lint, MD      . albuterol (PROVENTIL HFA;VENTOLIN HFA) 108 (90 BASE) MCG/ACT inhaler 2 puff  2 puff Inhalation Q4H PRN Almond Lint, MD      . atorvastatin (LIPITOR) tablet 40 mg  40 mg Oral Daily Almond Lint, MD   40 mg at 10/17/12 1005  .  citalopram (CELEXA) tablet 20 mg  20 mg Oral Daily Almond Lint, MD   20 mg at 10/17/12 1005  . dextrose 5 % and 0.45 % NaCl with KCl 20 mEq/L infusion   Intravenous Continuous Ernestene Mention, MD 50 mL/hr at 10/17/12 1317 1,000 mL at 10/17/12 1317  . diazepam (VALIUM) tablet 10 mg  10 mg Oral Q6H PRN Almond Lint, MD   10 mg at 10/17/12 1010  . diphenhydrAMINE (BENADRYL) injection 12.5 mg  12.5 mg Intravenous Q6H PRN Almond Lint, MD       Or  . diphenhydrAMINE (BENADRYL) 12.5 MG/5ML elixir 12.5 mg  12.5 mg Oral Q6H PRN Almond Lint, MD      . enoxaparin (LOVENOX) injection 40 mg  40 mg Subcutaneous Q24H Almond Lint, MD   40 mg at 10/16/12 2124  .  losartan (COZAAR) tablet 100 mg  100 mg Oral BID Almond Lint, MD   100 mg at 10/17/12 1008   And  . hydrochlorothiazide (HYDRODIURIL) tablet 25 mg  25 mg Oral BID Almond Lint, MD   25 mg at 10/16/12 2124  . levothyroxine (SYNTHROID, LEVOTHROID) tablet 50 mcg  50 mcg Oral QAC breakfast Almond Lint, MD   50 mcg at 10/17/12 0742  . metoprolol succinate (TOPROL-XL) 24 hr tablet 50 mg  50 mg Oral Daily Almond Lint, MD   50 mg at 10/17/12 1006  . morphine 2 MG/ML injection 1-2 mg  1-2 mg Intravenous Q2H PRN Almond Lint, MD      . ondansetron (ZOFRAN) tablet 4 mg  4 mg Oral Q12H Cherylynn Ridges, MD   4 mg at 10/17/12 1629  . pantoprazole (PROTONIX) EC tablet 40 mg  40 mg Oral BID Ernestene Mention, MD   40 mg at 10/17/12 1007  . spironolactone (ALDACTONE) tablet 25 mg  25 mg Oral Daily Almond Lint, MD   25 mg at 10/17/12 1006    Allergies as of 10/14/2012 - Review Complete 10/14/2012  Allergen Reaction Noted  . Ace inhibitors  09/14/2012  . Fetzima (levomilnacipran) Other (See Comments) 09/16/2012  . Lasix (furosemide) Other (See Comments) 09/12/2012  . Codeine Hives and Other (See Comments) 06/18/2011    Family History  Problem Relation Age of Onset  . Pneumonia Mother   . Hypertension Mother   . Heart attack Father   . Fibromyalgia Brother    . Pulmonary embolism Brother   . Hypertension Brother     History   Social History  . Marital Status: Married    Spouse Name: N/A    Number of Children: N/A  . Years of Education: N/A   Occupational History  . Not on file.   Social History Main Topics  . Smoking status: Never Smoker   . Smokeless tobacco: Never Used  . Alcohol Use: No  . Drug Use: No  . Sexually Active: No   Other Topics Concern  . Not on file   Social History Narrative  . No narrative on file    Review of Systems: ROS 10/14/12 by Dr. Donell Beers reviewed and I agree  Physical Exam: Vital signs in last 24 hours: Temp:  [97.6 F (36.4 C)-98.3 F (36.8 C)] 97.9 F (36.6 C) (04/21 1350) Pulse Rate:  [66-76] 66 (04/21 1350) Resp:  [18] 18 (04/21 1350) BP: (101-116)/(55-69) 107/61 mmHg (04/21 1350) SpO2:  [94 %-98 %] 98 % (04/21 1350) Last BM Date: 10/16/12 General:   Alert,  Well-developed, well-nourished, pleasant and cooperative in NAD Head:  Normocephalic and atraumatic. Eyes:  Sclera clear, no icterus.   Conjunctiva pink. Ears:  Normal auditory acuity. Nose:  No deformity, discharge,  or lesions. Mouth:  No deformity or lesions.  Oropharynx pink & moist. Neck:  Supple; no masses or thyromegaly. Lungs:  Clear throughout to auscultation.   No wheezes, crackles, or rhonchi. No acute distress. Heart:  Regular rate and rhythm; no murmurs, clicks, rubs,  or gallops. Abdomen:  Soft, nontender and nondistended. Well-healing laparoscopic scars; No masses, hepatosplenomegaly or hernias noted. Normal bowel sounds, without guarding, and without rebound.     Msk:  Symmetrical without gross deformities. Normal posture. Pulses:  Normal pulses noted. Extremities:  Without clubbing or edema. Neurologic:  Alert and  oriented x4;  Diffusely weak, otherwise grossly normal neurologically. Skin:  Intact without significant lesions or rashes. Cervical Nodes:  No  significant cervical adenopathy. Psych:  Alert and  cooperative. Depressed mood, flat affect   Lab Results:  Recent Labs  10/14/12 2045 10/15/12 0554 10/17/12 0553  WBC 7.4 6.0 7.0  HGB 13.0 12.5 12.7  HCT 38.9 37.5 37.9  PLT 414* 395 366   BMET  Recent Labs  10/14/12 2045 10/15/12 0554 10/17/12 0553  NA  --  136 136  K  --  3.5 3.1*  CL  --  99 99  CO2  --  28 28  GLUCOSE  --  116* 110*  BUN  --  13 8  CREATININE 1.04 0.93 0.78  CALCIUM  --  9.5 9.3   LFT  Recent Labs  10/17/12 0553  PROT 6.9  ALBUMIN 3.5  AST 23  ALT 22  ALKPHOS 79  BILITOT 0.2*   PT/INR  Recent Labs  10/15/12 0554  LABPROT 14.5  INR 1.15    Studies/Results: No results found.  Impression:  1.  Post-prandial nausea and early satiety.  Suspect component of narcotic- and (?) post-operative gastroparesis and perhaps generalized intestinal post-operative dysmotility (ileus). 2.  Elevated lipase.  Doubt related to pancreatitis, as recent MRCP showed no pancreatitis and patient has no abdominal pain.  Plan:  1.  IV ondansetron scheduled qac/qhs for 48 hours. 2.  Gastric emptying study tomorrow. 3.  Small, frequent meals.  Minimize fatty foods, fibrous meats/fruits/vegetables. 4.  Pending clinical course and gastric emptying study result, consider endoscopy (inpatient versus outpatient). 5.  Will follow.  Thank you for the consult.   LOS: 3 days   Christalyn Goertz M  10/17/2012, 5:21 PM

## 2012-10-18 ENCOUNTER — Encounter (INDEPENDENT_AMBULATORY_CARE_PROVIDER_SITE_OTHER): Payer: Medicare Other

## 2012-10-18 ENCOUNTER — Inpatient Hospital Stay (HOSPITAL_COMMUNITY): Payer: Medicare Other

## 2012-10-18 DIAGNOSIS — R110 Nausea: Secondary | ICD-10-CM | POA: Diagnosis not present

## 2012-10-18 DIAGNOSIS — R6881 Early satiety: Secondary | ICD-10-CM | POA: Diagnosis not present

## 2012-10-18 DIAGNOSIS — K30 Functional dyspepsia: Secondary | ICD-10-CM | POA: Clinically undetermined

## 2012-10-18 DIAGNOSIS — R109 Unspecified abdominal pain: Secondary | ICD-10-CM | POA: Diagnosis not present

## 2012-10-18 MED ORDER — TECHNETIUM TC 99M SULFUR COLLOID
2.0000 | Freq: Once | INTRAVENOUS | Status: AC | PRN
  Administered 2012-10-18: 2 via INTRAVENOUS

## 2012-10-18 MED ORDER — ZOFRAN 4 MG PO TABS: 4.0000 mg | ORAL_TABLET | Freq: Three times a day (TID) | ORAL | Status: DC | PRN

## 2012-10-18 MED ORDER — OMEPRAZOLE 20 MG PO CPDR: 20.0000 mg | DELAYED_RELEASE_CAPSULE | Freq: Every day | ORAL | Status: DC

## 2012-10-18 NOTE — Discharge Summary (Signed)
Physician Discharge Summary  Patient ID: Laura [name] MRN: [id number] DOB/AGE: 08/14/1945 67 y.o.  Admit date: 10/14/2012 Discharge date: 10/18/2012  Admitting Diagnosis: Nausea and Vomiting Elevated lipase S/p lap chole for cholecystitis  Discharge Diagnosis Patient Active Problem List   Diagnosis Date Noted  . Delayed gastric emptying 10/18/2012  . Steatohepatitis, nonalcoholic 09/29/2012  . Cholecystitis, acute 09/28/2012  . Pre-operative cardiovascular examination 09/28/2012  . Female stress incontinence 09/19/2012  . Takotsubo syndrome, June 2011.(normal coronaries) 09/12/2012  . Cardiomyopathy- EF 45-50% by echo 09/26/12 09/12/2012  . Anxiety disorder  09/12/2012  . HTN (hypertension) 09/12/2012  . Dyslipidemia 09/12/2012  . LBBB (left bundle branch block) 09/12/2012  . Obesity 09/12/2012  . Sleep apnea- non compliant with C-pap 09/12/2012    Consultants GI (Dr. Dulce Sellar)  Imaging: Nm Gastric Emptying  10/18/2012  *RADIOLOGY REPORT*  Clinical data: Nausea, early satiety, bloating, question gastroparesis  NUCLEAR MEDICINE GASTRIC EMPTYING EXAM:  Radiopharmaceutical:  2 mCi Tc-57m sulfur colloid labeled egg whites  Technique: The patient ingested a standardized meal containing radiolabeled egg whites. Imaging was performed in the anterior projection for 120 minutes. Gastric emptying is calculated from the obtained images.  Findings: Subjectively decreased emptying of tracer is seen from the stomach after 2 hours of imaging.  Quantitative analysis reveals 53% emptying at 2 hours, abnormally low/delayed.  IMPRESSION: Delayed gastric emptying.   Original Report Authenticated By: Ulyses Southward, M.D.     Procedures None  Hospital Course:  67 yo female who presents with worsening nausea and vomiting post op from lap chole. She says she felt good the first 24 hours after surgery with no nausea and with tolerable post op pain, but she stated that she had nausea and vomiting after  that every day. She was given scripts for nausea that helped at first, but got to where they haven't helped at all. She is no longer able to keep anything down. She is also having diarrhea with every BM. She describes this as non bloody and small volume. She has not taken her temperature, but has had subjective sweats and shaking chills. She is having minimal abdominal pain, if at all.   She was admitted for additional workup to her symptoms.  Lipase was the only lab elevated between 89-120, but was without source.  An MRCP was negative and GI (Dr. Dulce Sellar) was consulted.  A gastric emptying study was accomplished and showed delayed gastric emptying with 47% residual.  Diet was advanced as tolerated, but recommended smaller more frequent meals.  On HD#5, the patient was voiding well, tolerating diet with antiemetics, ambulating well, pain well controlled, vital signs stable, and felt stable for discharge home.  Patient call to reschedule an appt with Dr. Michaell Cowing in a few weeks, and knows to call with questions or concerns.      Medication List    STOP taking these medications       promethazine 12.5 MG tablet  Commonly known as:  PHENERGAN      TAKE these medications       acetaminophen 325 MG tablet  Commonly known as:  TYLENOL  Take 162.5 mg by mouth every 6 (six) hours as needed for pain (for headache.).     albuterol 108 (90 BASE) MCG/ACT inhaler  Commonly known as:  PROVENTIL HFA;VENTOLIN HFA  Inhale 2 puffs into the lungs as needed. Has not needed inhaler in 2 yrs.     atorvastatin 80 MG tablet  Commonly known as:  LIPITOR  Take  40 mg by mouth daily. AM     bumetanide 1 MG tablet  Commonly known as:  BUMEX  Take 1 mg by mouth daily. AM     CALCIUM PO  Take 1 tablet by mouth daily.     citalopram 20 MG tablet  Commonly known as:  CELEXA  Take 20 mg by mouth daily.     diazepam 10 MG tablet  Commonly known as:  VALIUM  Take 10 mg by mouth every 6 (six) hours as needed for  anxiety.     estradiol 1 MG tablet  Commonly known as:  ESTRACE  Take 1 mg by mouth daily. AM     levothyroxine 50 MCG tablet  Commonly known as:  SYNTHROID, LEVOTHROID  Take 50 mcg by mouth daily. AM     losartan-hydrochlorothiazide 100-25 MG per tablet  Commonly known as:  HYZAAR  Take 1 tablet by mouth 2 (two) times daily.     metoprolol succinate 50 MG 24 hr tablet  Commonly known as:  TOPROL-XL  Take 50 mg by mouth daily. AM     NUVIGIL 250 MG tablet  Generic drug:  Armodafinil  Take 250 mg by mouth daily. AM     omeprazole 20 MG capsule  Commonly known as:  PRILOSEC  Take 1 capsule (20 mg total) by mouth daily.     oxyCODONE 5 MG immediate release tablet  Commonly known as:  Oxy IR/ROXICODONE  Take 1-2 tablets (5-10 mg total) by mouth every 6 (six) hours as needed for pain.     potassium chloride 10 MEQ tablet  Commonly known as:  K-DUR  Take 10 mEq by mouth daily.     spironolactone 25 MG tablet  Commonly known as:  ALDACTONE  Take 25 mg by mouth daily.     Vitamin D3 2000 UNITS capsule  Take 2,000 Units by mouth daily.     ZOFRAN 4 MG tablet  Generic drug:  ondansetron  Take 1 tablet (4 mg total) by mouth every 8 (eight) hours as needed for nausea.     ondansetron 4 MG tablet  Commonly known as:  ZOFRAN  Take 4 mg by mouth every 8 (eight) hours as needed for nausea.             Follow-up Information   Follow up with GROSS,STEVEN C., MD. Schedule an appointment as soon as possible for a visit in 2 weeks.   Contact information:   [address] [place] [address] [phone]       Schedule an appointment as soon as possible for a visit with Freddy Jaksch, MD.   Contact information:   [address] [place] [address] [phone]       Signed: Candiss Norse [facility] [phone]  10/18/2012, 3:41 PM

## 2012-10-18 NOTE — Progress Notes (Signed)
Patient better.  Laura [name]. Gae Bon, MD, FACS [phone] [facility]

## 2012-10-18 NOTE — Progress Notes (Signed)
Subjective: Nausea improving.   Objective: Vital signs in last 24 hours: Temp:  [98.1 F (36.7 C)-98.2 F (36.8 C)] 98.1 F (36.7 C) (04/22 0518) Pulse Rate:  [66] 66 (04/22 0518) Resp:  [18] 18 (04/22 0518) BP: (97-98)/(55-56) 97/56 mmHg (04/22 0518) SpO2:  [95 %-96 %] 96 % (04/22 0518) Weight change:  Last BM Date: 10/16/12  PE: GEN:  Somewhat depressed-appearing, NAD  Results: Gastric emptying study:  Delayed gastric emptying.  Assessment:  1.  Post-operative nausea.  Suspect component of gastroparesis.  Plan:  1.  Discussed gastric emptying study results with patient and her sister. 2.  Small, frequent meals. 3.  OK to discharge home from GI perspective; she will drop by our office and pick up some literature and dietary recommendations for gastroparesis.  She has follow-up with me scheduled in early May. 4.  Thank you for the consult; will sign-off; please call with questions.   Freddy Jaksch 10/18/2012, 2:40 PM

## 2012-10-18 NOTE — Progress Notes (Signed)
  Subjective: Pt c/o continued nausea, no vomiting, and epigastric/lower chest "discomfort not pain".  Pt states the lower chest discomfort seems to be improving on Protonix.  Pt having BM's and passing flatus.  Ambulating well, much stronger than during last hospital stay.  Pt tolerating smaller meals intermittently with antiemetics.    Objective: Vital signs in last 24 hours: Temp:  [97.9 F (36.6 C)-98.3 F (36.8 C)] 98.1 F (36.7 C) (04/22 0518) Pulse Rate:  [66-68] 66 (04/22 0518) Resp:  [18] 18 (04/22 0518) BP: (97-108)/(55-69) 97/56 mmHg (04/22 0518) SpO2:  [95 %-98 %] 96 % (04/22 0518) Last BM Date: 10/16/12  Intake/Output from previous day: 04/21 0701 - 04/22 0700 In: 569.2 [P.O.:120; I.V.:449.2] Out: -  Intake/Output this shift:    PE: Gen:  Alert, NAD, pleasant Abd: Soft, NT/ND, +BS, no HSM, umbilical scar well healed   Lab Results:   Recent Labs  10/17/12 0553  WBC 7.0  HGB 12.7  HCT 37.9  PLT 366   BMET  Recent Labs  10/17/12 0553  NA 136  K 3.1*  CL 99  CO2 28  GLUCOSE 110*  BUN 8  CREATININE 0.78  CALCIUM 9.3   PT/INR No results found for this basename: LABPROT, INR,  in the last 72 hours CMP     Component Value Date/Time   NA 136 10/17/2012 0553   K 3.1* 10/17/2012 0553   CL 99 10/17/2012 0553   CO2 28 10/17/2012 0553   GLUCOSE 110* 10/17/2012 0553   BUN 8 10/17/2012 0553   CREATININE 0.78 10/17/2012 0553   CALCIUM 9.3 10/17/2012 0553   PROT 6.9 10/17/2012 0553   ALBUMIN 3.5 10/17/2012 0553   AST 23 10/17/2012 0553   ALT 22 10/17/2012 0553   ALKPHOS 79 10/17/2012 0553   BILITOT 0.2* 10/17/2012 0553   GFRNONAA 85* 10/17/2012 0553   GFRAA >90 10/17/2012 0553   Lipase     Component Value Date/Time   LIPASE 120* 10/17/2012 0553       Studies/Results: No results found.  Anti-infectives: Anti-infectives   None       Assessment/Plan Painless post-prandial nausea and vomiting 3 weeks postop laparoscopic cholecystectomy and  epigastric/lower chest discomfort 1.  Only abnormality is borderline elevated lipase. MRCP normal.  2.  Antiemetics, IVF, smaller frequent meals when able  3.  Pending gastric emptying study per GI; they are considering endoscopy 4.  Await GI workup  GERD? - continue protonix   LOS: 4 days    DORT, Ernestina Joe 10/18/2012, 9:52 AM Pager: [phone]

## 2012-10-28 DIAGNOSIS — I10 Essential (primary) hypertension: Secondary | ICD-10-CM | POA: Diagnosis not present

## 2012-10-28 DIAGNOSIS — E119 Type 2 diabetes mellitus without complications: Secondary | ICD-10-CM | POA: Diagnosis not present

## 2012-10-28 DIAGNOSIS — I509 Heart failure, unspecified: Secondary | ICD-10-CM | POA: Diagnosis not present

## 2012-11-04 DIAGNOSIS — R110 Nausea: Secondary | ICD-10-CM | POA: Diagnosis not present

## 2012-11-04 DIAGNOSIS — R6881 Early satiety: Secondary | ICD-10-CM | POA: Diagnosis not present

## 2012-11-08 ENCOUNTER — Encounter (INDEPENDENT_AMBULATORY_CARE_PROVIDER_SITE_OTHER): Payer: Medicare Other | Admitting: Surgery

## 2012-11-08 DIAGNOSIS — R6881 Early satiety: Secondary | ICD-10-CM | POA: Diagnosis not present

## 2012-11-08 DIAGNOSIS — R112 Nausea with vomiting, unspecified: Secondary | ICD-10-CM | POA: Diagnosis not present

## 2012-11-09 ENCOUNTER — Encounter (INDEPENDENT_AMBULATORY_CARE_PROVIDER_SITE_OTHER): Payer: Self-pay | Admitting: Surgery

## 2012-11-09 ENCOUNTER — Ambulatory Visit (INDEPENDENT_AMBULATORY_CARE_PROVIDER_SITE_OTHER): Payer: Medicare Other | Admitting: Surgery

## 2012-11-09 VITALS — BP 122/70 | HR 80 | Resp 18 | Ht 64.0 in | Wt 183.0 lb

## 2012-11-09 DIAGNOSIS — K30 Functional dyspepsia: Secondary | ICD-10-CM

## 2012-11-09 DIAGNOSIS — K7581 Nonalcoholic steatohepatitis (NASH): Secondary | ICD-10-CM

## 2012-11-09 DIAGNOSIS — K7689 Other specified diseases of liver: Secondary | ICD-10-CM

## 2012-11-09 DIAGNOSIS — R1013 Epigastric pain: Secondary | ICD-10-CM

## 2012-11-09 DIAGNOSIS — K8000 Calculus of gallbladder with acute cholecystitis without obstruction: Secondary | ICD-10-CM

## 2012-11-09 NOTE — Patient Instructions (Addendum)
Gastroparesis  Gastroparesis is also called slowed stomach emptying (delayed gastric emptying). It is a condition in which the stomach takes too long to empty its contents. It often happens in people with diabetes.  CAUSES  Gastroparesis happens when nerves to the stomach are damaged or stop working. When the nerves are damaged, the muscles of the stomach and intestines do not work normally. The movement of food is slowed or stopped. High blood glucose (sugar) causes changes in nerves and can damage the blood vessels that carry oxygen and nutrients to the nerves. RISK FACTORS  Diabetes.  Post-viral syndromes.  Eating disorders (anorexia, bulimia).  Surgery on the stomach or vagus nerve.  Gastroesophageal reflux disease (rarely).  Smooth muscle disorders (amyloidosis, scleroderma).  Metabolic disorders, including hypothyroidism.  Parkinson's disease. SYMPTOMS   Heartburn.  Feeling sick to your stomach (nausea).  Vomiting of undigested food.  An early feeling of fullness when eating.  Weight loss.  Abdominal bloating.  Erratic blood glucose levels.  Lack of appetite.  Gastroesophageal reflux.  Spasms of the stomach wall. Complications can include:  Bacterial overgrowth in stomach. Food stays in the stomach and can ferment and cause bacteria to grow.  Weight loss due to difficulty digesting and absorbing nutrients.  Vomiting.  Obstruction in the stomach. Undigested food can harden and cause nausea and vomiting.  Blood glucose fluctuations caused by inconsistent food absorption. DIAGNOSIS  The diagnosis of gastroparesis is confirmed through one or more of the following tests:  Barium X-rays and scans. These tests look at how long it takes for food to move through the stomach.  Gastric manometry. This test measures electrical and muscular activity in the stomach. A thin tube is passed down the throat into the stomach. The tube contains a wire that takes  measurements of the stomach's electrical and muscular activity as it digests liquids and solid food.  Endoscopy. This procedure is done with a long, thin tube called an endoscope. It is passed through the mouth and gently guides down the esophagus into the stomach. This tube helps the caregiver look at the lining of the stomach to check for any abnormalities.  Ultrasound. This can rule out gallbladder disease or pancreatitis. This test will outline and define the shape of the gallbladder and pancreas. TREATMENT   The primary treatment is to identify the problem and help control blood glucose levels. Treatments include:  Exercise.  Medicines to control nausea and vomiting.  Medicines to stimulate stomach muscles.  Changes in what and when you eat.  Having smaller meals more often.  Eating low-fiber forms of high-fiber foods, such aseating cooked vegetables instead of raw vegetables.  Eating low-fat foods.  Consuming liquids, which are easier to digest.  In severe cases, feeding tubes and intravenous (IV) feeding may be needed. It is important to note that in most cases, treatment does not cure gastroparesis. It is usually a lasting (chronic) condition. Treatment helps you manage the condition so that you can be as healthy and comfortable as possible. NEW TREATMENTS  A gastric neurostimulator has been developed to assist people with gastroparesis. The battery-operated device is surgically implanted. It emits mild electrical pulses to help improve stomach emptying and to control nausea and vomiting.  The use of botulinum toxin has been shown to improve stomach emptying by decreasing the prolonged contractions of the muscle between the stomach and the small intestine (pyloric sphincter). The benefits are temporary. SEEK MEDICAL CARE IF:   You are having problems keeping your blood glucose  in goal range.  You are having nausea, vomiting, bloating, or early feelings of fullness with  eating.  Your symptoms do not change with a change in diet. Document Released: 06/15/2005 Document Revised: 09/07/2011 Document Reviewed: 11/22/2008 [facility] Patient Information 2013 [place], [place].  LAPAROSCOPIC SURGERY: POST OP INSTRUCTIONS  1. DIET: Follow a light bland diet the first 24 hours after arrival home, such as soup, liquids, crackers, etc.  Be sure to include lots of fluids daily.  Avoid fast food or heavy meals as your are more likely to get nauseated.  Eat a low fat the next few days after surgery.   2. Take your usually prescribed home medications unless otherwise directed. 3. PAIN CONTROL: a. Pain is best controlled by a usual combination of three different methods TOGETHER: i. Ice/Heat ii. Over the counter pain medication iii. Prescription pain medication b. Most patients will experience some swelling and bruising around the incisions.  Ice packs or heating pads (30-60 minutes up to 6 times a day) will help. Use ice for the first few days to help decrease swelling and bruising, then switch to heat to help relax tight/sore spots and speed recovery.  Some people prefer to use ice alone, heat alone, alternating between ice & heat.  Experiment to what works for you.  Swelling and bruising can take several weeks to resolve.   c. It is helpful to take an over-the-counter pain medication regularly for the first few weeks.  Choose one of the following that works best for you: i. Naproxen (Aleve, etc)  Two 220mg  tabs twice a day ii. Ibuprofen (Advil, etc) Three 200mg  tabs four times a day (every meal & bedtime) iii. Acetaminophen (Tylenol, etc) 500-650mg  four times a day (every meal & bedtime) d. A  prescription for pain medication (such as oxycodone, hydrocodone, etc) should be given to you upon discharge.  Take your pain medication as prescribed.  i. If you are having problems/concerns with the prescription medicine (does not control pain, nausea, vomiting, rash, itching, etc),  please call us [phone] to see if we need to switch you to a different pain medicine that will work better for you and/or control your side effect better. ii. If you need a refill on your pain medication, please contact your pharmacy.  They will contact our office to request authorization. Prescriptions will not be filled after 5 pm or on week-ends. 4. Avoid getting constipated.  Between the surgery and the pain medications, it is common to experience some constipation.  Increasing fluid intake and taking a fiber supplement (such as Metamucil, Citrucel, FiberCon, MiraLax, etc) 1-2 times a day regularly will usually help prevent this problem from occurring.  A mild laxative (prune juice, Milk of Magnesia, MiraLax, etc) should be taken according to package directions if there are no bowel movements after 48 hours.   5. Watch out for diarrhea.  If you have many loose bowel movements, simplify your diet to bland foods & liquids for a few days.  Stop any stool softeners and decrease your fiber supplement.  Switching to mild anti-diarrheal medications (Kayopectate, Pepto Bismol) can help.  If this worsens or does not improve, please call us. 6. Wash / shower every day.  You may shower over the dressings as they are waterproof.  Continue to shower over incision(s) after the dressing is off. 7. Remove your waterproof bandages 5 days after surgery.  You may leave the incision open to air.  You may replace a dressing/Band-Aid to cover the  incision for comfort if you wish.  8. ACTIVITIES as tolerated:   a. You may resume regular (light) daily activities beginning the next day-such as daily self-care, walking, climbing stairs-gradually increasing activities as tolerated.  If you can walk 30 minutes without difficulty, it is safe to try more intense activity such as jogging, treadmill, bicycling, low-impact aerobics, swimming, etc. b. Save the most intensive and strenuous activity for last such as sit-ups, heavy  lifting, contact sports, etc  Refrain from any heavy lifting or straining until you are off narcotics for pain control.   c. DO NOT PUSH THROUGH PAIN.  Let pain be your guide: If it hurts to do something, don't do it.  Pain is your body warning you to avoid that activity for another week until the pain goes down. d. You may drive when you are no longer taking prescription pain medication, you can comfortably wear a seatbelt, and you can safely maneuver your car and apply brakes. e. Bonita Quin may have sexual intercourse when it is comfortable.  9. FOLLOW UP in our office a. Please call [facility] at [phone] to set up an appointment to see your surgeon in the office for a follow-up appointment approximately 2-3 weeks after your surgery. b. Make sure that you call for this appointment the day you arrive home to insure a convenient appointment time. 10. IF YOU HAVE DISABILITY OR FAMILY LEAVE FORMS, BRING THEM TO THE OFFICE FOR PROCESSING.  DO NOT GIVE THEM TO YOUR DOCTOR.   WHEN TO CALL us [phone]: 1. Poor pain control 2. Reactions / problems with new medications (rash/itching, nausea, etc)  3. Fever over 101.5 F (38.5 C) 4. Inability to urinate 5. Nausea and/or vomiting 6. Worsening swelling or bruising 7. Continued bleeding from incision. 8. Increased pain, redness, or drainage from the incision   The clinic staff is available to answer your questions during regular business hours (8:30am-5pm).  Please don't hesitate to call and ask to speak to one of our nurses for clinical concerns.   If you have a medical emergency, go to the nearest emergency room or call 911.  A surgeon from [facility] is always on call at the [facility], [place] [address], [place], [place]  [address] ? MAIN: [phone] ? TOLL FREE: [phone] ?  FAX [phone] [email or web address]  Cholecystitis Cholecystitis is an inflammation of your  gallbladder. It is usually caused by a buildup of gallstones or sludge (cholelithiasis) in your gallbladder. The gallbladder stores a fluid that helps digest fats (bile). Cholecystitis is serious and needs treatment right away.  CAUSES   Gallstones. Gallstones can block the tube that leads to your gallbladder, causing bile to build up. As bile builds up, the gallbladder becomes inflamed.  Bile duct problems, such as blockage from scarring or kinking.  Tumors. Tumors can stop bile from leaving your gallbladder correctly, causing bile to build up. As bile builds up, the gallbladder becomes inflamed. SYMPTOMS   Nausea.  Vomiting.  Abdominal pain, especially in the upper right area of your abdomen.  Abdominal tenderness or bloating.  Sweating.  Chills.  Fever.  Yellowing of the skin and the whites of the eyes (jaundice). DIAGNOSIS  Your caregiver may order blood tests to look for infection or gallbladder problems. Your caregiver may also order imaging tests, such as an ultrasound or computed tomography (CT) scan. Further tests may include a hepatobiliary iminodiacetic acid (HIDA) scan. This scan allows  your caregiver to see your bile move from the liver to the gallbladder and to the small intestine. TREATMENT  A hospital stay is usually necessary to lessen the inflammation of your gallbladder. You may be required to not eat or drink (fast) for a certain amount of time. You may be given medicine to treat pain or an antibiotic medicine to treat an infection. Surgery may be needed to remove your gallbladder (cholecystectomy) once the inflammation has gone down. Surgery may be needed right away if you develop complications such as death of gallbladder tissue (gangrene) or a tear (perforation) of the gallbladder.  HOME CARE INSTRUCTIONS  Home care will depend on your treatment. In general:  If you were given antibiotics, take them as directed. Finish them even if you start to feel  better.  Only take over-the-counter or prescription medicines for pain, discomfort, or fever as directed by your caregiver.  Follow a low-fat diet until you see your caregiver again.  Keep all follow-up visits as directed by your caregiver. SEEK IMMEDIATE MEDICAL CARE IF:   Your pain is increasing and not controlled by medicines.  Your pain moves to another part of your abdomen or to your back.  You have a fever.  You have nausea and vomiting. MAKE SURE YOU:  Understand these instructions.  Will watch your condition.  Will get help right away if you are not doing well or get worse. Document Released: 06/15/2005 Document Revised: 09/07/2011 Document Reviewed: 05/01/2011 [facility] Patient Information 2013 [place], [place].

## 2012-11-09 NOTE — Progress Notes (Signed)
Subjective:     Patient ID: Laura [name], female   DOB: June 17, 1946, 67 y.o.   MRN: [id number]  HPI  Laura [name]  Aug 16, 1965 [id number]  Patient Care Team: Lucky Cowboy, MD as PCP - General (Internal Medicine)  This patient is a 67 y.o.female who presents today for surgical evaluation s/p lap chole 09/29/2012  Gallbladder - CHRONIC ACTIVE CHOLECYSTITIS. - CHOLELITHIASIS. - ONE BENIGN LYMPH NODE (0/1). [place] RUND DO Pathologist, Electronic Signature (Case signed 10/03/2012)  Patient returns today.  She has had issues.  She required emergent surgery for acute cholecystitis.  Normal cholangiogram.  Went home.  Returned with worsening nausea and vomiting.  Workup for pancreatitis initially suspicious that ultimately negative.  Workup suspicious for gastroparesis.  Confirmed delayed gastric emptying study.  Followed by gastroenterology.  Switch to smaller meals and intermittent Zofran.  Gradually getting better.  No more abdominal pain.  Some heartburn reflux.  Had endoscopy that was otherwise normal.  She feels tired and frustrated.  Does not recall talking to me after surgery.  Family as they are and remembers me talking to them.  Energy level down but gradually getting better.  Still with early satiety and bloating.  Trying to do smaller meals.  Patient Active Problem List   Diagnosis Date Noted  . Delayed gastric emptying 10/18/2012  . Steatohepatitis, nonalcoholic 09/29/2012  . Acute cholecystitis s/p lap chole 09/29/2012 09/28/2012  . Pre-operative cardiovascular examination 09/28/2012  . Female stress incontinence 09/19/2012  . Takotsubo syndrome, June 2011.(normal coronaries) 09/12/2012  . Cardiomyopathy- EF 45-50% by echo 09/26/12 09/12/2012  . Anxiety disorder  09/12/2012  . HTN (hypertension) 09/12/2012  . Dyslipidemia 09/12/2012  . LBBB (left bundle branch block) 09/12/2012  . Obesity 09/12/2012  . Sleep apnea- non compliant with C-pap 09/12/2012    Past Medical  History  Diagnosis Date  . Hypothyroidism   . Anemia   . Seasonal allergies   . Depression   . SUI (stress urinary incontinence, female)   . OSA (obstructive sleep apnea) MODERATE PER STUDY 2005    CPAP NONCOMPLIANT  . History of kidney stones   . Nonischemic dilated cardiomyopathy     MODERATELY DEPRESSED LVF;EF 35-45% by Echo 05/27/11  . LBBB (left bundle branch block)   . Left ventricular ejection fraction less than 40%     38% PER CARDIOLOGIST NOTE (DR CROITORU)  . Short of breath on exertion   . Chronic combined systolic and diastolic CHF, NYHA class 2 CARDIOLOGIST-  DR NWGNFAOZ  . Hypertension   . History of non-ST elevation myocardial infarction (NSTEMI) JUNE 2011    SECONDARY TO TAKOTSUDO SYNDROME (CARDIAC CATH NORMAL)  . Hyperlipemia   . Anxiety   . Asthma     related to sesonal allergies    Past Surgical History  Procedure Laterality Date  . Nasal septum surgery  1980's  . Abdominal hysterectomy  1985    partial  . Cataract extraction w/ intraocular lens  implant, bilateral    . Right ureteroscopic stone extraction  08-31-2000  . Knee arthroscopy w/ meniscectomy  07-27-2011    MEDIAL AND LATERAL  . Transthoracic echocardiogram  05-27-2011  DR CROITORU    MODERATELY DEPRESSED LVF DUE TO GLOBAL HYPOKINESIS AND MARKED SYSTOLIC ASYNCHRONY/ EF 38%/ MILD LEFT ATRIAL DILATATION  . Cardiac catheterization  09-04-1999;  08/25/2004;   12/09/2009  DR CROITORU    NORMAL CORONARIES/  APICAL BALLOONING OF LV CONSISTENT WITH TAKOTSUBO SYMPTOMS/ EF 30-35%  . Pubovaginal sling N/A 09/19/2012  Procedure: SUBURETHRAL Elio Forget;  Surgeon: Valetta Fuller, MD;  Location: [facility];  Service: Urology;  Laterality: N/A;  . Cystoscopy N/A 09/19/2012    Procedure: CYSTOSCOPY FLEXIBLE;  Surgeon: Valetta Fuller, MD;  Location: [facility];  Service: Urology;  Laterality: N/A;  . Cholecystectomy N/A 09/29/2012    Procedure: LAPAROSCOPIC CHOLECYSTECTOMY WITH  INTRAOPERATIVE CHOLANGIOGRAM;  Surgeon: Ardeth Sportsman, MD;  Location: [facility] OR;  Service: General;  Laterality: N/A;    History   Social History  . Marital Status: Married    Spouse Name: N/A    Number of Children: N/A  . Years of Education: N/A   Occupational History  . Not on file.   Social History Main Topics  . Smoking status: Never Smoker   . Smokeless tobacco: Never Used  . Alcohol Use: No  . Drug Use: No  . Sexually Active: No   Other Topics Concern  . Not on file   Social History Narrative  . No narrative on file    Family History  Problem Relation Age of Onset  . Pneumonia Mother   . Hypertension Mother   . Heart attack Father   . Fibromyalgia Brother   . Pulmonary embolism Brother   . Hypertension Brother     Current Outpatient Prescriptions  Medication Sig Dispense Refill  . acetaminophen (TYLENOL) 325 MG tablet Take 162.5 mg by mouth every 6 (six) hours as needed for pain (for headache.).      Marland Kitchen albuterol (PROVENTIL HFA;VENTOLIN HFA) 108 (90 BASE) MCG/ACT inhaler Inhale 2 puffs into the lungs as needed. Has not needed inhaler in 2 yrs.      . Armodafinil (NUVIGIL) 250 MG tablet Take 250 mg by mouth daily. AM      . atorvastatin (LIPITOR) 80 MG tablet Take 40 mg by mouth daily. AM      . bumetanide (BUMEX) 1 MG tablet Take 1 mg by mouth daily. AM      . CALCIUM PO Take 1 tablet by mouth daily.      . Cholecalciferol (VITAMIN D3) 2000 UNITS capsule Take 2,000 Units by mouth daily.      . citalopram (CELEXA) 20 MG tablet Take 20 mg by mouth daily.      . diazepam (VALIUM) 10 MG tablet Take 10 mg by mouth every 6 (six) hours as needed for anxiety.      Marland Kitchen estradiol (ESTRACE) 1 MG tablet Take 1 mg by mouth daily. AM      . levothyroxine (SYNTHROID, LEVOTHROID) 50 MCG tablet Take 50 mcg by mouth daily. AM      . losartan-hydrochlorothiazide (HYZAAR) 100-25 MG per tablet Take 1 tablet by mouth 2 (two) times daily.       . metoprolol (TOPROL-XL) 50 MG 24 hr  tablet Take 50 mg by mouth daily. AM      . omeprazole (PRILOSEC) 20 MG capsule Take 1 capsule (20 mg total) by mouth daily.  30 capsule  0  . ondansetron (ZOFRAN) 4 MG tablet Take 4 mg by mouth every 8 (eight) hours as needed for nausea.      . potassium chloride (K-DUR) 10 MEQ tablet Take 10 mEq by mouth daily.      Marland Kitchen spironolactone (ALDACTONE) 25 MG tablet Take 25 mg by mouth daily.       No current facility-administered medications for this visit.     Allergies  Allergen Reactions  . Ace Inhibitors  unknown  . Fetzima (Levomilnacipran) Other (See Comments)    "Talking out of my head"  . Lasix (Furosemide) Other (See Comments)    HEADACHE  . Codeine Hives and Other (See Comments)    Headache     BP 122/70  Pulse 80  Resp 18  Ht 5\' 4"  (1.626 m)  Wt 183 lb (83.008 kg)  BMI 31.4 kg/m2  Nm Gastric Emptying  10/18/2012   *RADIOLOGY REPORT*  Clinical data: Nausea, early satiety, bloating, question gastroparesis  NUCLEAR MEDICINE GASTRIC EMPTYING EXAM:  Radiopharmaceutical:  2 mCi Tc-2m sulfur colloid labeled egg whites  Technique: The patient ingested a standardized meal containing radiolabeled egg whites. Imaging was performed in the anterior projection for 120 minutes. Gastric emptying is calculated from the obtained images.  Findings: Subjectively decreased emptying of tracer is seen from the stomach after 2 hours of imaging.  Quantitative analysis reveals 53% emptying at 2 hours, abnormally low/delayed.  IMPRESSION: Delayed gastric emptying.   Original Report Authenticated By: Ulyses Southward, M.D.   Mr 3d Recon At Scanner  10/15/2012   *RADIOLOGY REPORT*  Clinical Data:  Status post laparoscopic cholecystectomy, evaluate for retained common duct stone or peripancreatic fluid collection  MRI ABDOMEN WITHOUT AND WITH CONTRAST (INCLUDING MRCP)  Technique:  Multiplanar multisequence MR imaging of the abdomen was performed both before and after the administration of intravenous contrast.  Heavily T2-weighted images of the biliary and pancreatic ducts were obtained, and three-dimensional MRCP images were rendered by post processing.  Contrast: 18mL MULTIHANCE GADOBENATE DIMEGLUMINE 529 MG/ML IV SOLN  Comparison:  None.  Findings:  Liver is notable for suspected postsurgical changes along the gallbladder fossa (series 1603/image 50).  No suspicious/enhancing hepatic lesions.  No hepatic steatosis.  Spleen and adrenal glands are within normal limits.  Pancreas is within normal limits.  Specifically, no peripancreatic stranding/fluid collection to suggest acute pancreatitis.  Status post cholecystectomy.  No intrahepatic or extrahepatic ductal dilatation.  Common duct measures 7 mm.  No choledocholithiasis is seen.  Kidneys are within normal limits.  No hydronephrosis.  No abdominal ascites.  No suspicious abdominal lymphadenopathy.  No focal osseous lesions.  IMPRESSION: Status post cholecystectomy.  Common duct measures 7 mm.  No choledocholithiasis is seen.  Liver is notable for suspected postsurgical changes along the gallbladder fossa.  No MR findings to suggest acute pancreatitis.   Original Report Authenticated By: Charline Bills, M.D.   Dg Abd Acute W/chest  10/14/2012   *RADIOLOGY REPORT*  Clinical Data: Abdominal pain.  Status post cholecystectomy.  ACUTE ABDOMEN SERIES (ABDOMEN 2 VIEW & CHEST 1 VIEW)  Comparison: 09/28/2012  Findings:  Heart size is normal.  No pleural effusions or edema.  There is no airspace consolidation.  Bowel gas pattern appears nonobstructive.  There are no dilated loops of small bowel or air-fluid levels identified.  Cholecystectomy clips identified within the right upper quadrant of the abdomen.  Scoliosis deformity involves the lumbar spine.  There is associated multilevel degenerative disc disease.  IMPRESSION:  1.  No acute cardiopulmonary abnormalities. 2.  Nonobstructive bowel gas pattern.   Original Report Authenticated By: Signa Kell, M.D.   Mr Abd  W/wo Cm/mrcp  10/15/2012   *RADIOLOGY REPORT*  Clinical Data:  Status post laparoscopic cholecystectomy, evaluate for retained common duct stone or peripancreatic fluid collection  MRI ABDOMEN WITHOUT AND WITH CONTRAST (INCLUDING MRCP)  Technique:  Multiplanar multisequence MR imaging of the abdomen was performed both before and after the administration of intravenous contrast. Heavily T2-weighted images  of the biliary and pancreatic ducts were obtained, and three-dimensional MRCP images were rendered by post processing.  Contrast: 18mL MULTIHANCE GADOBENATE DIMEGLUMINE 529 MG/ML IV SOLN  Comparison:  None.  Findings:  Liver is notable for suspected postsurgical changes along the gallbladder fossa (series 1603/image 50).  No suspicious/enhancing hepatic lesions.  No hepatic steatosis.  Spleen and adrenal glands are within normal limits.  Pancreas is within normal limits.  Specifically, no peripancreatic stranding/fluid collection to suggest acute pancreatitis.  Status post cholecystectomy.  No intrahepatic or extrahepatic ductal dilatation.  Common duct measures 7 mm.  No choledocholithiasis is seen.  Kidneys are within normal limits.  No hydronephrosis.  No abdominal ascites.  No suspicious abdominal lymphadenopathy.  No focal osseous lesions.  IMPRESSION: Status post cholecystectomy.  Common duct measures 7 mm.  No choledocholithiasis is seen.  Liver is notable for suspected postsurgical changes along the gallbladder fossa.  No MR findings to suggest acute pancreatitis.   Original Report Authenticated By: Charline Bills, M.D.     Review of Systems  Constitutional: Negative for fever, chills and diaphoresis.  HENT: Negative for ear pain, sore throat and trouble swallowing.   Eyes: Negative for photophobia and visual disturbance.  Respiratory: Negative for cough and choking.   Cardiovascular: Negative for chest pain and palpitations.  Gastrointestinal: Negative for nausea, vomiting, abdominal pain,  diarrhea, constipation, anal bleeding and rectal pain.  Genitourinary: Negative for dysuria, frequency and difficulty urinating.  Musculoskeletal: Negative for myalgias and gait problem.  Skin: Negative for color change, pallor and rash.  Neurological: Negative for dizziness, speech difficulty, weakness and numbness.  Hematological: Negative for adenopathy.  Psychiatric/Behavioral: Negative for confusion and agitation. The patient is not nervous/anxious.        Objective:   Physical Exam  Constitutional: She is oriented to person, place, and time. She appears well-developed and well-nourished. No distress.  HENT:  Head: Normocephalic.  Mouth/Throat: Oropharynx is clear and moist. No oropharyngeal exudate.  Eyes: Conjunctivae and EOM are normal. Pupils are equal, round, and reactive to light. No scleral icterus.  Neck: Normal range of motion. No tracheal deviation present.  Cardiovascular: Normal rate and intact distal pulses.   Pulmonary/Chest: Effort normal. No respiratory distress. She exhibits no tenderness.  Abdominal: Soft. She exhibits no distension. There is no tenderness. Hernia confirmed negative in the right inguinal area and confirmed negative in the left inguinal area.  Incisions clean with normal healing ridges.  No hernias  Genitourinary: No vaginal discharge found.  Musculoskeletal: Normal range of motion. She exhibits no tenderness.  Lymphadenopathy:       Right: No inguinal adenopathy present.       Left: No inguinal adenopathy present.  Neurological: She is alert and oriented to person, place, and time. No cranial nerve deficit. She exhibits normal muscle tone. Coordination normal.  Skin: Skin is warm and dry. No rash noted. She is not diaphoretic.  Psychiatric: She has a normal mood and affect. Her behavior is normal.       Assessment:     Acute cholecystitis status post cholecystectomy.  No evidence of postoperative palpitations.  Delayed gastric emptying with  persistent intermittent nausea and bloating.  It into by gastroenterology.     Plan:     I spent some time talking to her.  I re-explained the pathophysiology of cholecystitis and the need for emergent surgery.  I noted this is a life-threatening problem and that she required emergent surgery.  Therefore, she needed more time and continues  to need more time to recover.  She had just had bladder surgery a little bit before that as well.  She has had a lot happen this year.  This was NOT an elective procedure.    The fact that she had gastroparesis flare up at this time has delayed her recovery as well.    However, I am hopeful that she is making improvements.  I think she has some anxiety and depression issues that have made a challenge for her to cope with this.  However by the end of the visit she seemed to be more positive.  She and her family were appreciative of discussing the reasoning et cetera.   Increase activity as tolerated to regular activity.  Low impact exercise such as walking an hour a day at least ideal.  Do not push through pain.  Diet as tolerated.  Low fat high fiber diet ideal.  Smaller meals and nausea control discussed.  Handout for gastroparesis given as well.  Continue followup with her gastroenterologist to help manage this.  Bowel regimen with 30 g fiber a day and fiber supplement as needed to avoid problems.  Return to clinic as needed.   Instructions discussed.  Followup with primary care physician for other health issues as would normally be done.  Questions answered.  The patient expressed understanding and appreciation

## 2012-11-10 DIAGNOSIS — I10 Essential (primary) hypertension: Secondary | ICD-10-CM | POA: Diagnosis not present

## 2012-12-16 ENCOUNTER — Emergency Department (HOSPITAL_COMMUNITY): Payer: Medicare Other

## 2012-12-16 ENCOUNTER — Emergency Department (HOSPITAL_COMMUNITY)
Admission: EM | Admit: 2012-12-16 | Discharge: 2012-12-16 | Disposition: A | Discharge status: Home / Self Care | Payer: Medicare Other | Attending: Emergency Medicine | Admitting: Emergency Medicine

## 2012-12-16 ENCOUNTER — Encounter (HOSPITAL_COMMUNITY): Payer: Self-pay | Admitting: Emergency Medicine

## 2012-12-16 DIAGNOSIS — Z8709 Personal history of other diseases of the respiratory system: Secondary | ICD-10-CM | POA: Insufficient documentation

## 2012-12-16 DIAGNOSIS — F329 Major depressive disorder, single episode, unspecified: Secondary | ICD-10-CM | POA: Diagnosis not present

## 2012-12-16 DIAGNOSIS — J45909 Unspecified asthma, uncomplicated: Secondary | ICD-10-CM | POA: Insufficient documentation

## 2012-12-16 DIAGNOSIS — Z862 Personal history of diseases of the blood and blood-forming organs and certain disorders involving the immune mechanism: Secondary | ICD-10-CM | POA: Insufficient documentation

## 2012-12-16 DIAGNOSIS — Z87448 Personal history of other diseases of urinary system: Secondary | ICD-10-CM | POA: Diagnosis not present

## 2012-12-16 DIAGNOSIS — R209 Unspecified disturbances of skin sensation: Secondary | ICD-10-CM | POA: Diagnosis not present

## 2012-12-16 DIAGNOSIS — F411 Generalized anxiety disorder: Secondary | ICD-10-CM | POA: Diagnosis not present

## 2012-12-16 DIAGNOSIS — I10 Essential (primary) hypertension: Secondary | ICD-10-CM | POA: Insufficient documentation

## 2012-12-16 DIAGNOSIS — E039 Hypothyroidism, unspecified: Secondary | ICD-10-CM | POA: Diagnosis not present

## 2012-12-16 DIAGNOSIS — Z87442 Personal history of urinary calculi: Secondary | ICD-10-CM | POA: Insufficient documentation

## 2012-12-16 DIAGNOSIS — I252 Old myocardial infarction: Secondary | ICD-10-CM | POA: Insufficient documentation

## 2012-12-16 DIAGNOSIS — F419 Anxiety disorder, unspecified: Secondary | ICD-10-CM

## 2012-12-16 DIAGNOSIS — G4733 Obstructive sleep apnea (adult) (pediatric): Secondary | ICD-10-CM | POA: Insufficient documentation

## 2012-12-16 DIAGNOSIS — R110 Nausea: Secondary | ICD-10-CM | POA: Diagnosis not present

## 2012-12-16 DIAGNOSIS — M79609 Pain in unspecified limb: Secondary | ICD-10-CM | POA: Diagnosis not present

## 2012-12-16 DIAGNOSIS — R450 Nervousness: Secondary | ICD-10-CM | POA: Insufficient documentation

## 2012-12-16 DIAGNOSIS — R079 Chest pain, unspecified: Secondary | ICD-10-CM

## 2012-12-16 DIAGNOSIS — R0602 Shortness of breath: Secondary | ICD-10-CM | POA: Diagnosis not present

## 2012-12-16 DIAGNOSIS — E785 Hyperlipidemia, unspecified: Secondary | ICD-10-CM | POA: Diagnosis not present

## 2012-12-16 DIAGNOSIS — F3289 Other specified depressive episodes: Secondary | ICD-10-CM | POA: Insufficient documentation

## 2012-12-16 DIAGNOSIS — Z8679 Personal history of other diseases of the circulatory system: Secondary | ICD-10-CM | POA: Diagnosis not present

## 2012-12-16 DIAGNOSIS — IMO0002 Reserved for concepts with insufficient information to code with codable children: Secondary | ICD-10-CM | POA: Diagnosis not present

## 2012-12-16 DIAGNOSIS — R259 Unspecified abnormal involuntary movements: Secondary | ICD-10-CM | POA: Insufficient documentation

## 2012-12-16 DIAGNOSIS — Z79899 Other long term (current) drug therapy: Secondary | ICD-10-CM | POA: Diagnosis not present

## 2012-12-16 DIAGNOSIS — I5042 Chronic combined systolic (congestive) and diastolic (congestive) heart failure: Secondary | ICD-10-CM | POA: Insufficient documentation

## 2012-12-16 DIAGNOSIS — Z9861 Coronary angioplasty status: Secondary | ICD-10-CM | POA: Diagnosis not present

## 2012-12-16 LAB — CBC WITH DIFFERENTIAL/PLATELET
Basophils Relative: 1 % (ref 0–1)
Eosinophils Absolute: 0.1 10*3/uL (ref 0.0–0.7)
Eosinophils Relative: 1 % (ref 0–5)
Hemoglobin: 13.4 g/dL (ref 12.0–15.0)
Lymphs Abs: 1.5 10*3/uL (ref 0.7–4.0)
MCH: 25.5 pg — ABNORMAL LOW (ref 26.0–34.0)
MCHC: 33.6 g/dL (ref 30.0–36.0)
MCV: 76 fL — ABNORMAL LOW (ref 78.0–100.0)
Monocytes Absolute: 0.6 10*3/uL (ref 0.1–1.0)
Monocytes Relative: 7 % (ref 3–12)
RBC: 5.25 MIL/uL — ABNORMAL HIGH (ref 3.87–5.11)

## 2012-12-16 LAB — BASIC METABOLIC PANEL
BUN: 17 mg/dL (ref 6–23)
Calcium: 9.3 mg/dL (ref 8.4–10.5)
Creatinine, Ser: 0.86 mg/dL (ref 0.50–1.10)
GFR calc Af Amer: 79 mL/min — ABNORMAL LOW (ref >90)
GFR calc non Af Amer: 68 mL/min — ABNORMAL LOW (ref >90)
Glucose, Bld: 112 mg/dL — ABNORMAL HIGH (ref 70–99)

## 2012-12-16 LAB — POCT I-STAT TROPONIN I: Troponin i, poc: 0.01 ng/mL (ref 0.00–0.08)

## 2012-12-16 MED ORDER — ACETAMINOPHEN 325 MG PO TABS
650.0000 mg | ORAL_TABLET | Freq: Once | ORAL | Status: AC
  Administered 2012-12-16: 650 mg via ORAL
  Filled 2012-12-16: qty 2

## 2012-12-16 MED ORDER — ASPIRIN 81 MG PO CHEW
324.0000 mg | CHEWABLE_TABLET | Freq: Once | ORAL | Status: AC
  Administered 2012-12-16: 324 mg via ORAL
  Filled 2012-12-16: qty 4

## 2012-12-16 MED ORDER — LORAZEPAM 2 MG/ML IJ SOLN
1.0000 mg | Freq: Once | INTRAMUSCULAR | Status: AC
Start: 2012-12-16 — End: 2012-12-16
  Administered 2012-12-16: 1 mg via INTRAVENOUS
  Filled 2012-12-16: qty 1

## 2012-12-16 NOTE — ED Provider Notes (Signed)
Medical screening examination/treatment/procedure(s) were conducted as a shared visit with non-physician practitioner(s) and myself.  I personally evaluated the patient during the encounter.   Derwood Kaplan, MD 12/16/12 [phone]

## 2012-12-16 NOTE — ED Notes (Signed)
Pt presents anxious c/o left arm numbness and tingling and nausea upon waking this am; pt sts hx of gall bladder removal in March; pt shaking and appears anxious; pt sts she has had some medication changes lately and just stopped taking celexa; pt sts "if I cant get this under control I am going to kill myself" and is tearful

## 2012-12-16 NOTE — ED Provider Notes (Signed)
History     CSN: [id number]  Arrival date & time 12/16/12  0718   First MD Initiated Contact with Patient 12/16/12 [phone]      Chief Complaint  Patient presents with  . Arm Pain  . Anxiety  . Nausea    (Consider location/radiation/quality/duration/timing/severity/associated sxs/prior treatment) HPI Comments: Patient with h/o anxiety disorder, questionable takusubo cardiomyopathy -- presents with c/o anxiety, severe nausea and left arm numbness. She is afraid these symptoms are from her heart but also concedes that it might be a panic attack. She denies SOB or chest pain. She denies palpitations, diaphoresis. Symptoms began upon waking 2 hours ago. Patient states that she felt so nauseous she had to lie down immediately after getting up. She did not vomit. She denies abdominal pain or diarrhea. She took a valium and 81mg  aspirin after waking. Patient states that she self-discontinued Celexa 3-4 days ago because it was making her sleep all of the time. The onset of this condition was acute. The course is constant. Aggravating factors: none. Alleviating factors: none.    The history is provided by the patient and medical records.    Past Medical History  Diagnosis Date  . Hypothyroidism   . Anemia   . Seasonal allergies   . Depression   . SUI (stress urinary incontinence, female)   . OSA (obstructive sleep apnea) MODERATE PER STUDY 2005    CPAP NONCOMPLIANT  . History of kidney stones   . Nonischemic dilated cardiomyopathy     MODERATELY DEPRESSED LVF;EF 35-45% by Echo 05/27/11  . LBBB (left bundle branch block)   . Left ventricular ejection fraction less than 40%     38% PER CARDIOLOGIST NOTE (DR CROITORU)  . Short of breath on exertion   . Chronic combined systolic and diastolic CHF, NYHA class 2 CARDIOLOGIST-  DR [id number]  . Hypertension   . History of non-ST elevation myocardial infarction (NSTEMI) JUNE 2011    SECONDARY TO TAKOTSUDO SYNDROME (CARDIAC CATH NORMAL)  .  Hyperlipemia   . Anxiety   . Asthma     related to sesonal allergies    Past Surgical History  Procedure Laterality Date  . Nasal septum surgery  1980's  . Abdominal hysterectomy  1985    partial  . Cataract extraction w/ intraocular lens  implant, bilateral    . Right ureteroscopic stone extraction  08-31-2000  . Knee arthroscopy w/ meniscectomy  07-27-2011    MEDIAL AND LATERAL  . Transthoracic echocardiogram  05-27-2011  DR CROITORU    MODERATELY DEPRESSED LVF DUE TO GLOBAL HYPOKINESIS AND MARKED SYSTOLIC ASYNCHRONY/ EF 38%/ MILD LEFT ATRIAL DILATATION  . Cardiac catheterization  09-04-1999;  08/25/2004;   12/09/2009  DR CROITORU    NORMAL CORONARIES/  APICAL BALLOONING OF LV CONSISTENT WITH TAKOTSUBO SYMPTOMS/ EF 30-35%  . Pubovaginal sling N/A 09/19/2012    Procedure: SUBURETHRAL Elio Forget;  Surgeon: Valetta Fuller, MD;  Location: [facility];  Service: Urology;  Laterality: N/A;  . Cystoscopy N/A 09/19/2012    Procedure: CYSTOSCOPY FLEXIBLE;  Surgeon: Valetta Fuller, MD;  Location: [facility];  Service: Urology;  Laterality: N/A;  . Cholecystectomy N/A 09/29/2012    Procedure: LAPAROSCOPIC CHOLECYSTECTOMY WITH INTRAOPERATIVE CHOLANGIOGRAM;  Surgeon: Ardeth Sportsman, MD;  Location: [facility] OR;  Service: General;  Laterality: N/A;    Family History  Problem Relation Age of Onset  . Pneumonia Mother   . Hypertension Mother   . Heart attack Father   .  Fibromyalgia Brother   . Pulmonary embolism Brother   . Hypertension Brother     History  Substance Use Topics  . Smoking status: Never Smoker   . Smokeless tobacco: Never Used  . Alcohol Use: No    OB History   Grav Para Term Preterm Abortions TAB SAB Ect Mult Living                  Review of Systems  Constitutional: Negative for fever and diaphoresis.  HENT: Negative for neck pain.   Eyes: Negative for redness.  Respiratory: Negative for cough and shortness of breath.   Cardiovascular:  Positive for chest pain. Negative for palpitations and leg swelling.  Gastrointestinal: Negative for nausea, vomiting and abdominal pain.  Genitourinary: Negative for dysuria.  Musculoskeletal: Negative for back pain.  Skin: Negative for rash.  Neurological: Positive for tremors. Negative for syncope and light-headedness.  Psychiatric/Behavioral: Positive for agitation. Negative for suicidal ideas. The patient is nervous/anxious.     Allergies  Ace inhibitors; Fetzima; Lasix; and Codeine  Home Medications   Current Outpatient Rx  Name  Route  Sig  Dispense  Refill  . acetaminophen (TYLENOL) 325 MG tablet   Oral   Take 162.5 mg by mouth every 6 (six) hours as needed for pain (for headache.).         Marland Kitchen albuterol (PROVENTIL HFA;VENTOLIN HFA) 108 (90 BASE) MCG/ACT inhaler   Inhalation   Inhale 2 puffs into the lungs as needed. Has not needed inhaler in 2 yrs.         . Armodafinil (NUVIGIL) 250 MG tablet   Oral   Take 250 mg by mouth daily. AM         . atorvastatin (LIPITOR) 80 MG tablet   Oral   Take 40 mg by mouth daily. AM         . bumetanide (BUMEX) 1 MG tablet   Oral   Take 1 mg by mouth daily. AM         . CALCIUM PO   Oral   Take 1 tablet by mouth daily.         . Cholecalciferol (VITAMIN D3) 2000 UNITS capsule   Oral   Take 2,000 Units by mouth daily.         . citalopram (CELEXA) 20 MG tablet   Oral   Take 20 mg by mouth daily.         . diazepam (VALIUM) 10 MG tablet   Oral   Take 10 mg by mouth every 6 (six) hours as needed for anxiety.         Marland Kitchen estradiol (ESTRACE) 1 MG tablet   Oral   Take 1 mg by mouth daily. AM         . levothyroxine (SYNTHROID, LEVOTHROID) 50 MCG tablet   Oral   Take 50 mcg by mouth daily. AM         . losartan-hydrochlorothiazide (HYZAAR) 100-25 MG per tablet   Oral   Take 1 tablet by mouth 2 (two) times daily.          . metoprolol (TOPROL-XL) 50 MG 24 hr tablet   Oral   Take 50 mg by mouth  daily. AM         . omeprazole (PRILOSEC) 20 MG capsule   Oral   Take 1 capsule (20 mg total) by mouth daily.   30 capsule   0   . ondansetron (ZOFRAN) 4 MG tablet  Oral   Take 4 mg by mouth every 8 (eight) hours as needed for nausea.         . potassium chloride (K-DUR) 10 MEQ tablet   Oral   Take 10 mEq by mouth daily.         Marland Kitchen spironolactone (ALDACTONE) 25 MG tablet   Oral   Take 25 mg by mouth daily.           BP 157/130  Pulse 83  Temp(Src) 97.4 F (36.3 C) (Oral)  Resp 26  SpO2 99%  Physical Exam  Nursing note and vitals reviewed. Constitutional: She appears well-developed and well-nourished.  HENT:  Head: Normocephalic and atraumatic.  Mouth/Throat: Mucous membranes are normal. Mucous membranes are not dry.  Eyes: Conjunctivae are normal.  Neck: Trachea normal and normal range of motion. Neck supple. Normal carotid pulses and no JVD present. No muscular tenderness present. Carotid bruit is not present. No tracheal deviation present.  Cardiovascular: Normal rate, regular rhythm, S1 normal, S2 normal, normal heart sounds and intact distal pulses.  Exam reveals no decreased pulses.   No murmur heard. Pulmonary/Chest: Effort normal. No respiratory distress. She has no wheezes. She exhibits no tenderness.  Abdominal: Soft. Normal aorta and bowel sounds are normal. There is no tenderness. There is no rebound and no guarding.  Musculoskeletal: Normal range of motion.  Neurological: She is alert.  tremulous  Skin: Skin is warm and dry. She is not diaphoretic. No cyanosis. No pallor.  Psychiatric: Her mood appears anxious. She expresses no homicidal and no suicidal ideation.    ED Course  Procedures (including critical care time)  Labs Reviewed  CBC WITH DIFFERENTIAL - Abnormal; Notable for the following:    RBC 5.25 (*)    MCV 76.0 (*)    MCH 25.5 (*)    All other components within normal limits  BASIC METABOLIC PANEL - Abnormal; Notable for the  following:    Sodium 134 (*)    Glucose, Bld 112 (*)    GFR calc non Af Amer 68 (*)    GFR calc Af Amer 79 (*)    All other components within normal limits  POCT I-STAT TROPONIN I  POCT I-STAT TROPONIN I   Dg Chest 2 View  12/16/2012   *RADIOLOGY REPORT*  Clinical Data: Chest pain, weakness, shortness of breath  CHEST - 2 VIEW  Comparison: 10/14/2012; 09/28/2012  Findings:  Grossly unchanged cardiac silhouette and mediastinal contours. Improved aeration of the lungs with persistent left lower lung heterogeneous opacities favored to represent atelectasis or scar. No focal airspace opacity.  No definite pleural effusion or pneumothorax.  No definite evidence of edema.  Mildly accentuated thoracic kyphosis.  Mild to moderate multilevel thoracic spine DDD is suspected.  Post cholecystectomy.  IMPRESSION: 1.  No acute cardiopulmonary disease. 2.  Chronic left basilar atelectasis/scar.   Original Report Authenticated By: Tacey Ruiz, MD     1. Chest pain   2. Anxiety     7:57 AM Patient seen and examined. Work-up initiated. Medications ordered.   Vital signs reviewed and are as follows: Filed Vitals:   12/16/12 0729  BP: 157/130  Pulse: 83  Temp: 97.4 F (36.3 C)  Resp: 26    Date: 12/16/2012  Rate: 94  Rhythm: normal sinus rhythm  QRS Axis: left  Intervals: normal  ST/T Wave abnormalities: normal  Conduction Disutrbances:left bundle branch block  Narrative Interpretation:   Old EKG Reviewed: changed from 09/28/2012 -- resolution of tachycardia  Two sets of troponin are negative. Patient is feeling much better.   Discussed use of Celexa with patient. I encouraged the patient to either take her normally prescribed Valium on a schedule to prevent panic or to resume Celexa at 20mg  until she can f/u with PCP next week. I think she wants to take Valium every 6-8 hrs.   Patient was counseled to return with severe chest pain, especially if the pain is crushing or pressure-like and  spreads to the arms, back, neck, or jaw, or if they have sweating, nausea, or shortness of breath with the pain. They were encouraged to call 911 with these symptoms.   They were also told to return if their chest pain gets worse and does not go away with rest, they have an attack of chest pain lasting longer than usual despite rest and treatment with the medications their caregiver has prescribed, if they wake from sleep with chest pain or shortness of breath, if they feel dizzy or faint, if they have chest pain not typical of their usual pain, or if they have any other emergent concerns regarding their health.  The patient verbalized understanding and agreed.    MDM  Patient with severe anxiety, nausea, and left arm tingling likely due to anxiety brought on by abrupt discontinuation of 40 mg Celexa that she has been taking for the past 6 weeks. EKG is unchanged LBBB. 2 sets of troponins are negative. Chest pain is atypical for cardiac ischemia. Patient has good primary care followup. She would like to go home and follow up with her primary care physician. Remainder of workup is unconcerning. Do not suspect any dangerous or life threatening medical conditions at this time. Appropriate return instructions given.        Renne Crigler, PA-C 12/16/12 1600

## 2012-12-20 DIAGNOSIS — F411 Generalized anxiety disorder: Secondary | ICD-10-CM | POA: Diagnosis not present

## 2012-12-20 DIAGNOSIS — E039 Hypothyroidism, unspecified: Secondary | ICD-10-CM | POA: Diagnosis not present

## 2012-12-20 DIAGNOSIS — D649 Anemia, unspecified: Secondary | ICD-10-CM | POA: Diagnosis not present

## 2012-12-20 DIAGNOSIS — Z79899 Other long term (current) drug therapy: Secondary | ICD-10-CM | POA: Diagnosis not present

## 2012-12-22 ENCOUNTER — Telehealth: Payer: Self-pay | Admitting: Cardiovascular Disease

## 2012-12-22 NOTE — Telephone Encounter (Signed)
Dr. Royann Shivers out of the office.  Will notify an Extender.  Message forwarded to B. Leron Croak, PA-C for further instructions.  Paper chart# [id number] on cart.

## 2012-12-22 NOTE — Telephone Encounter (Signed)
Laura [name] is wanting to know can she take Brintellix-10mg  being that she is a heart patient and would like to know .Says she will feel better if Dr.Croitoru will let her know if this is ok..   Thanks

## 2012-12-23 NOTE — Telephone Encounter (Signed)
Ok to start Brintellix.  Laura [name] 8:24 AM

## 2012-12-23 NOTE — Telephone Encounter (Signed)
Returned call and informed pt per instructions by MD/PA.  Pt verbalized understanding and agreed w/ plan.  

## 2013-01-06 ENCOUNTER — Telehealth: Payer: Self-pay | Admitting: Cardiovascular Disease

## 2013-01-06 NOTE — Telephone Encounter (Deleted)
Pt apparently seen in the ER 01/02/13 and noted to be bradycardic. He was set up for a Holter in our office. Holter recorded HR 31 at midnight. Pt contacted and indicated he was asymptomatic. I discussed with Dr Allyson Sabal- he will see pt next week. The pt should not drive.  Corine Shelter PA-C 01/06/2013 1:04 PM

## 2013-01-06 NOTE — Telephone Encounter (Deleted)
This note was on the wrong pt and I am unable to delete it.  Corine Shelter PA-C 01/06/2013 1:11 PM

## 2013-01-06 NOTE — Telephone Encounter (Signed)
Pt's daughter called stating that her mother has been put on an anti-depressant by her PCP and has some questions about the side effects.

## 2013-01-06 NOTE — Telephone Encounter (Signed)
Returned call and spoke w/ Laura [name], pt's daughter.  Stated pt's PCP started her on Pristiq and she wants to know if it is okay.  Informed Laura [name] that pt called a couple of weeks ago about starting Brintellix.  Laura [name] stated that didn't agree w/ her and they are trying this med.  Reviewed Pristiq and there is a warning to monitor BP and decrease dose or discontinue if elevated BP persists.  Laura [name] informed of this and verbalized understanding.  Informed an Extender will be notified to review this afternoon in clinic and RN will call back when response is received.  Laura [name] also advised to call pt's PCP since he rx'd med and should be aware of pt's complete med list.  Also informed the pharmacy would have the best information in regards to drug interactions/contraindications w/ pt's meds.  Pt verbalized understanding and agreed w/ plan.  Will await response from Extender.  Message forwarded to Laura Glatter, PA-C for further instructions.  Chart # [id number] on triage cart.

## 2013-01-06 NOTE — Telephone Encounter (Signed)
Returned call and informed Tammy per instructions by PA.  Verbalized understanding and agreed w/ plan.  Will have BP checked at PCP if not here and have result sent in.

## 2013-01-06 NOTE — Telephone Encounter (Signed)
I could fid no contraindication to taking Pristiq from a cardiac standpoint. She should have her B/P checked one month after starting treatment.  Corine Shelter PA-C 01/06/2013 1:15 PM

## 2013-01-09 ENCOUNTER — Other Ambulatory Visit: Payer: Self-pay | Admitting: *Deleted

## 2013-01-09 MED ORDER — BUMETANIDE 1 MG PO TABS: 1.0000 mg | ORAL_TABLET | ORAL | Status: DC

## 2013-01-16 DIAGNOSIS — H35379 Puckering of macula, unspecified eye: Secondary | ICD-10-CM | POA: Diagnosis not present

## 2013-01-19 ENCOUNTER — Encounter (INDEPENDENT_AMBULATORY_CARE_PROVIDER_SITE_OTHER): Payer: Self-pay | Admitting: Ophthalmology

## 2013-01-26 DIAGNOSIS — N3946 Mixed incontinence: Secondary | ICD-10-CM | POA: Diagnosis not present

## 2013-02-14 DIAGNOSIS — H35379 Puckering of macula, unspecified eye: Secondary | ICD-10-CM | POA: Diagnosis not present

## 2013-02-14 DIAGNOSIS — H43819 Vitreous degeneration, unspecified eye: Secondary | ICD-10-CM | POA: Diagnosis not present

## 2013-03-10 ENCOUNTER — Encounter: Payer: Self-pay | Admitting: Cardiovascular Disease

## 2013-03-10 ENCOUNTER — Ambulatory Visit (INDEPENDENT_AMBULATORY_CARE_PROVIDER_SITE_OTHER): Payer: Medicare Other | Admitting: Cardiovascular Disease

## 2013-03-10 VITALS — BP 112/72 | HR 77 | Resp 16 | Ht 63.0 in | Wt 169.6 lb

## 2013-03-10 DIAGNOSIS — G4730 Sleep apnea, unspecified: Secondary | ICD-10-CM | POA: Diagnosis not present

## 2013-03-10 DIAGNOSIS — I428 Other cardiomyopathies: Secondary | ICD-10-CM | POA: Diagnosis not present

## 2013-03-10 DIAGNOSIS — I429 Cardiomyopathy, unspecified: Secondary | ICD-10-CM

## 2013-03-10 DIAGNOSIS — E785 Hyperlipidemia, unspecified: Secondary | ICD-10-CM

## 2013-03-10 DIAGNOSIS — I10 Essential (primary) hypertension: Secondary | ICD-10-CM

## 2013-03-10 DIAGNOSIS — R002 Palpitations: Secondary | ICD-10-CM

## 2013-03-10 NOTE — Patient Instructions (Addendum)
Your physician has recommended that you wear a holter monitor. Holter monitors are medical devices that record the heart's electrical activity. Doctors most often use these monitors to diagnose arrhythmias. Arrhythmias are problems with the speed or rhythm of the heartbeat. The monitor is a small, portable device. You can wear one while you do your normal daily activities. This is usually used to diagnose what is causing palpitations/syncope (passing out).  Your physician recommends that you schedule a follow-up appointment in: 6 months  You will be notified with the results of your monitor within 3-5 business days after it is returned.

## 2013-03-12 ENCOUNTER — Encounter: Payer: Self-pay | Admitting: Cardiovascular Disease

## 2013-03-12 DIAGNOSIS — R002 Palpitations: Secondary | ICD-10-CM | POA: Insufficient documentation

## 2013-03-12 NOTE — Assessment & Plan Note (Signed)
She appears to be clinically euvolemic and has NYHA functional class II status at worst. I think her complaints of fatigue are not related to her cardiovascular issues. She is on appropriate therapy with long-acting beta blockers and angiotensin receptor blocker as well as Aldactone and a low dose loop diuretic. She has a chronic left bundle branch block which may be an opportunity for treatments with cardiac resynchronization therapy should her left ventricular ejection fraction and clinical syndrome of heart failure deteriorate.

## 2013-03-12 NOTE — Assessment & Plan Note (Signed)
Good control. Her systolic blood pressure has been low at times which has limited any additional increase in heart failure medications.

## 2013-03-12 NOTE — Progress Notes (Signed)
Patient ID: Laura [name], female   DOB: 1946-04-03, 67 y.o.   MRN: [id number]     Reason for office visit Palpitations  Laura [name] has chronic nonischemic cardiomyopathy. When she first presented in 2011 she had chest pain and normal coronaries by angiography. She was felt to have takotsubo syndrome. However her left ventricular ejection fraction never improved and she was subsequently diagnosed with dilated nonischemic cardiomyopathy. Her ejection fraction has been as low as 25% but on appropriate heart failure medications has increased to 45-50%.  Laura [name] presents today with complaints of frequent and very troublesome palpitations. These occur randomly both at rest and with light activity. Some have woken her from sleep. They're associated with anxiety but not with dyspnea or presyncope. Has not had chest pain. Her biggest complaint is that of fatigue and daytime somnolence. She wakes up feeling tired and does not feel like getting out of bed. She is wearing her CPAP faithfully. She feels is not helping as much as it did when she first started using it. She is the same or so describe her current antidepressant medication: At first it helped but now it is no longer working.    Allergies  Allergen Reactions  . Codeine Anaphylaxis, Hives and Other (See Comments)    Headache. Daughter reported that it caused her throat to swell up   . Ace Inhibitors Other (See Comments)    Unknown- it "didn't agree with her"   . Fetzima [Levomilnacipran] Other (See Comments)    "Talking out of my head"  . Lasix [Furosemide] Other (See Comments)    HEADACHE  . Nsaids Other (See Comments)    Told not to take NSAIDs because of her heart     Current Outpatient Prescriptions  Medication Sig Dispense Refill  . acetaminophen (TYLENOL) 325 MG tablet Take 162.5 mg by mouth every 6 (six) hours as needed for pain (for headache.).      Marland Kitchen albuterol (PROVENTIL HFA;VENTOLIN HFA) 108 (90 BASE) MCG/ACT inhaler Inhale 2 puffs  into the lungs as needed. Has not needed inhaler in 2 yrs.      . Armodafinil (NUVIGIL) 250 MG tablet Take 250 mg by mouth daily. AM      . atorvastatin (LIPITOR) 80 MG tablet Take 40 mg by mouth every morning.       . bumetanide (BUMEX) 1 MG tablet Take 1 tablet (1 mg total) by mouth every morning.  90 tablet  3  . calcium carbonate (TUMS EX) 750 MG chewable tablet Chew 1 tablet by mouth daily.      . Cholecalciferol (VITAMIN D3) 2000 UNITS capsule Take 2,000 Units by mouth daily.      Marland Kitchen desvenlafaxine (PRISTIQ) 50 MG 24 hr tablet Take 50 mg by mouth daily.      . diazepam (VALIUM) 10 MG tablet Take 10 mg by mouth every 6 (six) hours as needed for anxiety.      Marland Kitchen estradiol (ESTRACE) 2 MG tablet Take 2 mg by mouth daily.      Marland Kitchen levothyroxine (SYNTHROID, LEVOTHROID) 50 MCG tablet Take 75 mcg by mouth daily. AM      . losartan-hydrochlorothiazide (HYZAAR) 100-25 MG per tablet Take 1 tablet by mouth 2 (two) times daily.       . metoprolol (TOPROL-XL) 50 MG 24 hr tablet Take 50 mg by mouth every morning.       Marland Kitchen omeprazole (PRILOSEC) 20 MG capsule Take 1 capsule (20 mg total) by mouth daily.  30 capsule  0  . potassium chloride (K-DUR) 10 MEQ tablet Take 20 mEq by mouth daily.       Marland Kitchen spironolactone (ALDACTONE) 25 MG tablet Take 25 mg by mouth daily.       No current facility-administered medications for this visit.    Past Medical History  Diagnosis Date  . Hypothyroidism   . Anemia   . Seasonal allergies   . Depression   . SUI (stress urinary incontinence, female)   . OSA (obstructive sleep apnea) MODERATE PER STUDY 2005    CPAP NONCOMPLIANT  . History of kidney stones   . Nonischemic dilated cardiomyopathy     MODERATELY DEPRESSED LVF;EF 35-45% by Echo 05/27/11  . LBBB (left bundle branch block)   . Left ventricular ejection fraction less than 40%     38% PER CARDIOLOGIST NOTE (DR Norah Fick)  . Short of breath on exertion   . Chronic combined systolic and diastolic CHF, NYHA class 2  CARDIOLOGIST-  DR [id number]  . Hypertension   . History of non-ST elevation myocardial infarction (NSTEMI) JUNE 2011    SECONDARY TO TAKOTSUDO SYNDROME (CARDIAC CATH NORMAL)  . Hyperlipemia   . Anxiety   . Asthma     related to sesonal allergies    Past Surgical History  Procedure Laterality Date  . Nasal septum surgery  1980's  . Abdominal hysterectomy  1985    partial  . Cataract extraction w/ intraocular lens  implant, bilateral    . Right ureteroscopic stone extraction  08-31-2000  . Knee arthroscopy w/ meniscectomy  07-27-2011    MEDIAL AND LATERAL  . Transthoracic echocardiogram  05-27-2011  DR Rashika Bettes    MODERATELY DEPRESSED LVF DUE TO GLOBAL HYPOKINESIS AND MARKED SYSTOLIC ASYNCHRONY/ EF 38%/ MILD LEFT ATRIAL DILATATION  . Cardiac catheterization  09-04-1999;  08/25/2004;   12/09/2009  DR Etan Vasudevan    NORMAL CORONARIES/  APICAL BALLOONING OF LV CONSISTENT WITH TAKOTSUBO SYMPTOMS/ EF 30-35%  . Pubovaginal sling N/A 09/19/2012    Procedure: SUBURETHRAL Elio Forget;  Surgeon: Valetta Fuller, MD;  Location: [facility];  Service: Urology;  Laterality: N/A;  . Cystoscopy N/A 09/19/2012    Procedure: CYSTOSCOPY FLEXIBLE;  Surgeon: Valetta Fuller, MD;  Location: [facility];  Service: Urology;  Laterality: N/A;  . Cholecystectomy N/A 09/29/2012    Procedure: LAPAROSCOPIC CHOLECYSTECTOMY WITH INTRAOPERATIVE CHOLANGIOGRAM;  Surgeon: Ardeth Sportsman, MD;  Location: [facility];  Service: General;  Laterality: N/A;    Family History  Problem Relation Age of Onset  . Pneumonia Mother   . Hypertension Mother   . Heart attack Father   . Fibromyalgia Brother   . Pulmonary embolism Brother   . Hypertension Brother     History   Social History  . Marital Status: Married    Spouse Name: N/A    Number of Children: N/A  . Years of Education: N/A   Occupational History  . Not on file.   Social History Main Topics  . Smoking status: Never Smoker   . Smokeless  tobacco: Never Used  . Alcohol Use: No  . Drug Use: No  . Sexual Activity: No   Other Topics Concern  . Not on file   Social History Narrative  . No narrative on file    Review of systems: The patient specifically denies any chest pain at rest or with exertion, dyspnea at rest or with exertion, orthopnea, paroxysmal nocturnal dyspnea, syncope, focal neurological deficits, intermittent claudication, lower extremity edema,  unexplained weight gain, cough, hemoptysis or wheezing.  The patient also denies abdominal pain, nausea, vomiting, dysphagia, diarrhea, constipation, polyuria, polydipsia, dysuria, hematuria, frequency, urgency, abnormal bleeding or bruising, fever, chills, unexpected weight changes, mood swings, change in skin or hair texture, change in voice quality, auditory or visual problems, allergic reactions or rashes, new musculoskeletal complaints other than usual "aches and pains".   PHYSICAL EXAM BP 112/72  Pulse 77  Resp 16  Ht 5\' 3"  (1.6 m)  Wt 169 lb 9.6 oz (76.93 kg)  BMI 30.05 kg/m2  General: Alert, oriented x3, no distress Head: no evidence of trauma, PERRL, EOMI, no exophtalmos or lid lag, no myxedema, no xanthelasma; normal ears, nose and oropharynx Neck: normal jugular venous pulsations and no hepatojugular reflux; brisk carotid pulses without delay and no carotid bruits Chest: clear to auscultation, no signs of consolidation by percussion or palpation, normal fremitus, symmetrical and full respiratory excursions Cardiovascular: normal position and quality of the apical impulse, regular rhythm, normal first and paradoxically split second heart sounds, no murmurs, rubs or gallops Abdomen: no tenderness or distention, no masses by palpation, no abnormal pulsatility or arterial bruits, normal bowel sounds, no hepatosplenomegaly Extremities: no clubbing, cyanosis or edema; 2+ radial, ulnar and brachial pulses bilaterally; 2+ right femoral, posterior tibial and  dorsalis pedis pulses; 2+ left femoral, posterior tibial and dorsalis pedis pulses; no subclavian or femoral bruits Neurological: grossly nonfocal   EKG: Sinus rhythm, chronic left bundle branch block  Lipid Panel     Component Value Date/Time   CHOL 154 06/19/2011 0355   TRIG 164* 06/19/2011 0355   HDL 50 06/19/2011 0355   CHOLHDL 3.1 06/19/2011 0355   VLDL 33 06/19/2011 0355   LDLCALC 71 06/19/2011 0355    BMET    Component Value Date/Time   NA 134* 12/16/2012 0944   K 3.5 12/16/2012 0944   CL 98 12/16/2012 0944   CO2 26 12/16/2012 0944   GLUCOSE 112* 12/16/2012 0944   BUN 17 12/16/2012 0944   CREATININE 0.86 12/16/2012 0944   CALCIUM 9.3 12/16/2012 0944   GFRNONAA 68* 12/16/2012 0944   GFRAA 79* 12/16/2012 0944     ASSESSMENT AND PLAN Cardiomyopathy- EF 45-50% by echo 09/26/12 She appears to be clinically euvolemic and has NYHA functional class II status at worst. I think her complaints of fatigue are not related to her cardiovascular issues. She is on appropriate therapy with long-acting beta blockers and angiotensin receptor blocker as well as Aldactone and a low dose loop diuretic. She has a chronic left bundle branch block which may be an opportunity for treatments with cardiac resynchronization therapy should her left ventricular ejection fraction and clinical syndrome of heart failure deteriorate.  Palpitations Since she has cardiomyopathy she is at risk for atrophic relation as well as ventricular arrhythmia. I've recommended that she wear an event monitor.  HTN (hypertension) Good control. Her systolic blood pressure has been low at times which has limited any additional increase in heart failure medications.  Sleep apnea- non compliant with C-pap She states that recently she has been 100% compliance with CPAP. This really helped her overall well-being and her hypersomnolence in the past. However she believes that now is not working well and she wakes up feeling  tired. I'm not sure whether her fatigue and somnolence are related to worsening depression or related to ineffective CPAP therapy have recommended that she have her home BiPAP machine memory card reviewed to see if there opportunities for improvement.  Dyslipidemia  09/27/2012 total cholesterol 171 triglycerides 157, HDL 46, LDL 94, all within the desirable range. Hemoglobin A1c was 6.5% at that time.   Orders Placed This Encounter  Procedures  . EKG 12-Lead  . Holter monitor - 24 hour   Meds ordered this encounter  Medications  . estradiol (ESTRACE) 2 MG tablet    Sig: Take 2 mg by mouth daily.  Marland Kitchen desvenlafaxine (PRISTIQ) 50 MG 24 hr tablet    Sig: Take 50 mg by mouth daily.    Junious Silk, MD, [facility] [phone] office [phone] pager

## 2013-03-12 NOTE — Assessment & Plan Note (Signed)
She states that recently she has been 100% compliance with CPAP. This really helped her overall well-being and her hypersomnolence in the past. However she believes that now is not working well and she wakes up feeling tired. I'm not sure whether her fatigue and somnolence are related to worsening depression or related to ineffective CPAP therapy have recommended that she have her home BiPAP machine memory card reviewed to see if there opportunities for improvement.

## 2013-03-12 NOTE — Assessment & Plan Note (Signed)
Since she has cardiomyopathy she is at risk for atrophic relation as well as ventricular arrhythmia. I've recommended that she wear an event monitor.

## 2013-03-12 NOTE — Assessment & Plan Note (Signed)
09/27/2012 total cholesterol 171 triglycerides 157, HDL 46, LDL 94, all within the desirable range. Hemoglobin A1c was 6.5% at that time.

## 2013-03-14 DIAGNOSIS — R7309 Other abnormal glucose: Secondary | ICD-10-CM | POA: Diagnosis not present

## 2013-03-14 DIAGNOSIS — E559 Vitamin D deficiency, unspecified: Secondary | ICD-10-CM | POA: Diagnosis not present

## 2013-03-14 DIAGNOSIS — D649 Anemia, unspecified: Secondary | ICD-10-CM | POA: Diagnosis not present

## 2013-03-14 DIAGNOSIS — E782 Mixed hyperlipidemia: Secondary | ICD-10-CM | POA: Diagnosis not present

## 2013-03-14 DIAGNOSIS — I10 Essential (primary) hypertension: Secondary | ICD-10-CM | POA: Diagnosis not present

## 2013-03-14 DIAGNOSIS — E039 Hypothyroidism, unspecified: Secondary | ICD-10-CM | POA: Diagnosis not present

## 2013-03-14 DIAGNOSIS — E538 Deficiency of other specified B group vitamins: Secondary | ICD-10-CM | POA: Diagnosis not present

## 2013-03-14 DIAGNOSIS — Z79899 Other long term (current) drug therapy: Secondary | ICD-10-CM | POA: Diagnosis not present

## 2013-04-05 DIAGNOSIS — K648 Other hemorrhoids: Secondary | ICD-10-CM | POA: Diagnosis not present

## 2013-04-05 DIAGNOSIS — K5900 Constipation, unspecified: Secondary | ICD-10-CM | POA: Diagnosis not present

## 2013-04-05 DIAGNOSIS — K6289 Other specified diseases of anus and rectum: Secondary | ICD-10-CM | POA: Diagnosis not present

## 2013-04-05 DIAGNOSIS — K625 Hemorrhage of anus and rectum: Secondary | ICD-10-CM | POA: Diagnosis not present

## 2013-04-07 ENCOUNTER — Telehealth: Payer: Self-pay | Admitting: *Deleted

## 2013-04-11 ENCOUNTER — Telehealth: Payer: Self-pay | Admitting: Cardiovascular Disease

## 2013-04-11 NOTE — Telephone Encounter (Signed)
Message forwarded to K. Alvstad, PharmD, who works w/ Chiropractor.

## 2013-04-11 NOTE — Telephone Encounter (Signed)
Would like to know if Dr. Royann Shivers can get her on a program for Nuvigil 250 mg , because the cost of it is so expensive.. Please call  Thanks

## 2013-04-11 NOTE — Telephone Encounter (Signed)
Explained to pt that we can only do medication assistance for meds that are prescribed by our MDs.  Asked her to contact MD who handles her sleep apnea.  She stated that her psychiatrist and another MD dealt with that, but she doesn't see them much.  Explained that Dr. Salena Saner cannot sign the assistance form, that we would mail it to her and she could decide which of her other MDs could help. Form mailed 04/11/13

## 2013-04-12 NOTE — Telephone Encounter (Signed)
No refills needed at this time

## 2013-04-13 ENCOUNTER — Encounter: Payer: Self-pay | Admitting: Cardiovascular Disease

## 2013-04-13 ENCOUNTER — Ambulatory Visit (INDEPENDENT_AMBULATORY_CARE_PROVIDER_SITE_OTHER): Payer: Medicare Other | Admitting: Cardiovascular Disease

## 2013-04-13 VITALS — BP 148/62 | HR 68 | Ht 63.0 in | Wt 166.8 lb

## 2013-04-13 DIAGNOSIS — I499 Cardiac arrhythmia, unspecified: Secondary | ICD-10-CM

## 2013-04-13 DIAGNOSIS — F411 Generalized anxiety disorder: Secondary | ICD-10-CM | POA: Diagnosis not present

## 2013-04-13 DIAGNOSIS — I428 Other cardiomyopathies: Secondary | ICD-10-CM

## 2013-04-13 DIAGNOSIS — F419 Anxiety disorder, unspecified: Secondary | ICD-10-CM

## 2013-04-13 DIAGNOSIS — I429 Cardiomyopathy, unspecified: Secondary | ICD-10-CM

## 2013-04-13 NOTE — Progress Notes (Signed)
Patient ID: Laura [name], female   DOB: 1946-05-13, 67 y.o.   MRN: [id number] Holter monitor showed frequent PVCs during periods of activity, none at night and a single 6 beat run of atrial tachycardia Very upset with her psychiatric meds. Very nervous and angry today. Gave her samples of nuvigil since she is in the donut hole, she has been taking it intermittently and cut in half since she cannot afford it. Feels like she wants to sleep all the time when she doesn't take it.     Reason for office visit Follow up Holter monitor, nonischemic CMP  Holter monitor showed frequent PVCs during periods of activity, none at night and a single 6 beat run of atrial tachycardia. Her patient activated recording shows NSR. Very upset with her psychiatric meds. Very nervous and angry today. Placed on Prozac and feels that it makes her hostile. Gave her samples of her nuvigil since she is in the "doughnut hole" and cannot afford her Rx, she has been taking it intermittently and cut in half since she cannot afford it. Feels like she wants to sleep all the time when she doesn't take it.  Allergies  Allergen Reactions  . Codeine Anaphylaxis, Hives and Other (See Comments)    Headache. Daughter reported that it caused her throat to swell up   . Ace Inhibitors Other (See Comments)    Unknown- it "didn't agree with her"   . Fetzima [Levomilnacipran] Other (See Comments)    "Talking out of my head"  . Lasix [Furosemide] Other (See Comments)    HEADACHE  . Nsaids Other (See Comments)    Told not to take NSAIDs because of her heart     Current Outpatient Prescriptions  Medication Sig Dispense Refill  . acetaminophen (TYLENOL) 325 MG tablet Take 162.5 mg by mouth every 6 (six) hours as needed for pain (for headache.).      Marland Kitchen albuterol (PROVENTIL HFA;VENTOLIN HFA) 108 (90 BASE) MCG/ACT inhaler Inhale 2 puffs into the lungs as needed.       . Armodafinil (NUVIGIL) 250 MG tablet Take 250 mg by mouth daily. AM       . atorvastatin (LIPITOR) 80 MG tablet Take 40 mg by mouth every morning.       . bumetanide (BUMEX) 1 MG tablet Take 1 tablet (1 mg total) by mouth every morning.  90 tablet  3  . calcium carbonate (TUMS EX) 750 MG chewable tablet Chew 1 tablet by mouth daily.      . Cholecalciferol (VITAMIN D3) 2000 UNITS capsule Take 2,000 Units by mouth daily.      . diazepam (VALIUM) 10 MG tablet Take 10 mg by mouth every 6 (six) hours as needed for anxiety.      Marland Kitchen estradiol (ESTRACE) 2 MG tablet Take 2 mg by mouth daily.      Marland Kitchen FLUoxetine (PROZAC) 20 MG capsule Take 20 mg by mouth daily.      Marland Kitchen levothyroxine (SYNTHROID, LEVOTHROID) 50 MCG tablet Take 75 mcg by mouth daily. AM      . losartan-hydrochlorothiazide (HYZAAR) 100-25 MG per tablet Take 1 tablet by mouth daily.       . metoprolol (TOPROL-XL) 50 MG 24 hr tablet Take 50 mg by mouth every morning.       Marland Kitchen omeprazole (PRILOSEC) 20 MG capsule Take 1 capsule (20 mg total) by mouth daily.  30 capsule  0  . potassium chloride (K-DUR) 10 MEQ tablet Take 20 mEq by mouth  daily.       . spironolactone (ALDACTONE) 25 MG tablet Take 25 mg by mouth daily.       No current facility-administered medications for this visit.    Past Medical History  Diagnosis Date  . Hypothyroidism   . Anemia   . Seasonal allergies   . Depression   . SUI (stress urinary incontinence, female)   . OSA (obstructive sleep apnea) MODERATE PER STUDY 2005    CPAP NONCOMPLIANT  . History of kidney stones   . Nonischemic dilated cardiomyopathy     MODERATELY DEPRESSED LVF;EF 35-45% by Echo 05/27/11  . LBBB (left bundle branch block)   . Left ventricular ejection fraction less than 40%     38% PER CARDIOLOGIST NOTE (DR Shuna Tabor)  . Short of breath on exertion   . Chronic combined systolic and diastolic CHF, NYHA class 2 CARDIOLOGIST-  DR [id number]  . Hypertension   . History of non-ST elevation myocardial infarction (NSTEMI) JUNE 2011    SECONDARY TO TAKOTSUDO SYNDROME  (CARDIAC CATH NORMAL)  . Hyperlipemia   . Anxiety   . Asthma     related to sesonal allergies    Past Surgical History  Procedure Laterality Date  . Nasal septum surgery  1980's  . Abdominal hysterectomy  1985    partial  . Cataract extraction w/ intraocular lens  implant, bilateral    . Right ureteroscopic stone extraction  08-31-2000  . Knee arthroscopy w/ meniscectomy  07-27-2011    MEDIAL AND LATERAL  . Transthoracic echocardiogram  05-27-2011  DR Kharter Sestak    MODERATELY DEPRESSED LVF DUE TO GLOBAL HYPOKINESIS AND MARKED SYSTOLIC ASYNCHRONY/ EF 38%/ MILD LEFT ATRIAL DILATATION  . Cardiac catheterization  09-04-1999;  08/25/2004;   12/09/2009  DR Kiril Hippe    NORMAL CORONARIES/  APICAL BALLOONING OF LV CONSISTENT WITH TAKOTSUBO SYMPTOMS/ EF 30-35%  . Pubovaginal sling N/A 09/19/2012    Procedure: SUBURETHRAL Elio Forget;  Surgeon: Valetta Fuller, MD;  Location: [facility];  Service: Urology;  Laterality: N/A;  . Cystoscopy N/A 09/19/2012    Procedure: CYSTOSCOPY FLEXIBLE;  Surgeon: Valetta Fuller, MD;  Location: [facility];  Service: Urology;  Laterality: N/A;  . Cholecystectomy N/A 09/29/2012    Procedure: LAPAROSCOPIC CHOLECYSTECTOMY WITH INTRAOPERATIVE CHOLANGIOGRAM;  Surgeon: Ardeth Sportsman, MD;  Location: [facility];  Service: General;  Laterality: N/A;    Family History  Problem Relation Age of Onset  . Pneumonia Mother   . Hypertension Mother   . Heart attack Father   . Fibromyalgia Brother   . Pulmonary embolism Brother   . Hypertension Brother     History   Social History  . Marital Status: Married    Spouse Name: N/A    Number of Children: N/A  . Years of Education: N/A   Occupational History  . Not on file.   Social History Main Topics  . Smoking status: Never Smoker   . Smokeless tobacco: Never Used  . Alcohol Use: No  . Drug Use: No  . Sexual Activity: No   Other Topics Concern  . Not on file   Social History Narrative  .  No narrative on file    Review of systems: Feels angry and hostile, has threatened people over the phone The patient specifically denies any chest pain at rest or with exertion, dyspnea at rest or with exertion, orthopnea, paroxysmal nocturnal dyspnea, syncope, focal neurological deficits, intermittent claudication, lower extremity edema, unexplained weight gain, cough,  hemoptysis or wheezing.  The patient also denies abdominal pain, nausea, vomiting, dysphagia, diarrhea, constipation, polyuria, polydipsia, dysuria, hematuria, frequency, urgency, abnormal bleeding or bruising, fever, chills, unexpected weight changes, mood swings, change in skin or hair texture, change in voice quality, auditory or visual problems, allergic reactions or rashes, new musculoskeletal complaints other than usual "aches and pains".  PHYSICAL EXAM BP 148/62  Pulse 68  Ht 5\' 3"  (1.6 m)  Wt 166 lb 12.8 oz (75.66 kg)  BMI 29.55 kg/m2 General: Alert, oriented x3, no distress  Head: no evidence of trauma, PERRL, EOMI, no exophtalmos or lid lag, no myxedema, no xanthelasma; normal ears, nose and oropharynx  Neck: normal jugular venous pulsations and no hepatojugular reflux; brisk carotid pulses without delay and no carotid bruits  Chest: clear to auscultation, no signs of consolidation by percussion or palpation, normal fremitus, symmetrical and full respiratory excursions  Cardiovascular: normal position and quality of the apical impulse, regular rhythm, normal first and paradoxically split second heart sounds, no murmurs, rubs or gallops  Abdomen: no tenderness or distention, no masses by palpation, no abnormal pulsatility or arterial bruits, normal bowel sounds, no hepatosplenomegaly  Extremities: no clubbing, cyanosis or edema; 2+ radial, ulnar and brachial pulses bilaterally; 2+ right femoral, posterior tibial and dorsalis pedis pulses; 2+ left femoral, posterior tibial and dorsalis pedis pulses; no subclavian or  femoral bruits  Neurological: grossly nonfocal   EKG: NSR, LBBB  Lipid Panel     Component Value Date/Time   CHOL 154 06/19/2011 0355   TRIG 164* 06/19/2011 0355   HDL 50 06/19/2011 0355   CHOLHDL 3.1 06/19/2011 0355   VLDL 33 06/19/2011 0355   LDLCALC 71 06/19/2011 0355    BMET    Component Value Date/Time   NA 134* 12/16/2012 0944   K 3.5 12/16/2012 0944   CL 98 12/16/2012 0944   CO2 26 12/16/2012 0944   GLUCOSE 112* 12/16/2012 0944   BUN 17 12/16/2012 0944   CREATININE 0.86 12/16/2012 0944   CALCIUM 9.3 12/16/2012 0944   GFRNONAA 68* 12/16/2012 0944   GFRAA 79* 12/16/2012 0944     ASSESSMENT AND PLAN Arrhythmia Palpitations are associated with benign findings. Continue beta blocker. No indication for anticoagulation or antiarrhythmic.  Anxiety disorder  Her psychiatric condition and the medications prescribed for it have been quite complicated. I have advised that these medications be adjusted by a specialist, but I will monitor for potential interactions with her cardiac illness and medications.  Cardiomyopathy- EF 45-50% by echo 09/26/12 NYHA class I-II, euvolemia by exam. On appropriate meds. CRT not necessary with well compensated CHf and only mildly depressed EF, but this may be a future option.    Meds ordered this encounter  Medications  . FLUoxetine (PROZAC) 20 MG capsule    Sig: Take 20 mg by mouth daily.    Junious Silk, MD, [facility] [phone] office [phone] pager

## 2013-04-13 NOTE — Patient Instructions (Signed)
Your physician recommends that you schedule a follow-up appointment in: 6 months  

## 2013-04-14 DIAGNOSIS — I499 Cardiac arrhythmia, unspecified: Secondary | ICD-10-CM | POA: Insufficient documentation

## 2013-04-14 NOTE — Assessment & Plan Note (Signed)
Her psychiatric condition and the medications prescribed for it have been quite complicated. I have advised that these medications be adjusted by a specialist, but I will monitor for potential interactions with her cardiac illness and medications.

## 2013-04-14 NOTE — Assessment & Plan Note (Signed)
NYHA class I-II, euvolemia by exam. On appropriate meds. CRT not necessary with well compensated CHf and only mildly depressed EF, but this may be a future option.

## 2013-04-14 NOTE — Assessment & Plan Note (Signed)
Palpitations are associated with benign findings. Continue beta blocker. No indication for anticoagulation or antiarrhythmic.

## 2013-04-17 ENCOUNTER — Encounter: Payer: Self-pay | Admitting: Cardiovascular Disease

## 2013-04-19 DIAGNOSIS — K648 Other hemorrhoids: Secondary | ICD-10-CM | POA: Diagnosis not present

## 2013-04-25 ENCOUNTER — Encounter: Payer: Self-pay | Admitting: Cardiovascular Disease

## 2013-04-25 ENCOUNTER — Ambulatory Visit (INDEPENDENT_AMBULATORY_CARE_PROVIDER_SITE_OTHER): Payer: Medicare Other | Admitting: Cardiovascular Disease

## 2013-04-25 VITALS — BP 110/60 | HR 72 | Ht 63.0 in | Wt 170.1 lb

## 2013-04-25 DIAGNOSIS — G4733 Obstructive sleep apnea (adult) (pediatric): Secondary | ICD-10-CM

## 2013-04-25 DIAGNOSIS — I499 Cardiac arrhythmia, unspecified: Secondary | ICD-10-CM

## 2013-04-25 DIAGNOSIS — F411 Generalized anxiety disorder: Secondary | ICD-10-CM

## 2013-04-25 DIAGNOSIS — F419 Anxiety disorder, unspecified: Secondary | ICD-10-CM

## 2013-04-25 DIAGNOSIS — I428 Other cardiomyopathies: Secondary | ICD-10-CM | POA: Diagnosis not present

## 2013-04-25 DIAGNOSIS — E785 Hyperlipidemia, unspecified: Secondary | ICD-10-CM

## 2013-04-25 DIAGNOSIS — I429 Cardiomyopathy, unspecified: Secondary | ICD-10-CM

## 2013-04-25 NOTE — Patient Instructions (Signed)
Your physician recommends that you schedule a follow-up appointment in: next [facility].  We will obtain a download from [facility]. Dr. Tresa Endo will make needed adjustments as needed following download results at sleep appointment.

## 2013-05-03 DIAGNOSIS — K648 Other hemorrhoids: Secondary | ICD-10-CM | POA: Diagnosis not present

## 2013-05-04 ENCOUNTER — Other Ambulatory Visit: Payer: Self-pay

## 2013-05-27 ENCOUNTER — Encounter: Payer: Self-pay | Admitting: Cardiovascular Disease

## 2013-05-27 DIAGNOSIS — G4733 Obstructive sleep apnea (adult) (pediatric): Secondary | ICD-10-CM | POA: Insufficient documentation

## 2013-05-27 NOTE — Progress Notes (Signed)
Patient ID: [name], female   DOB: [birth year], 67 y.o.   MRN: [id number]     PATIENT PROFILE: Laura [name] is a 67 year old female who is followed by Dr. Lyda [name] for over for her Cardiologic care. The patient has a history of obstructive sleep apnea. She had an initial sleep study done in the past by Dr. Budd [name]. The patient feels that she has not had any subsequent assessment and has expressed dissatisfaction. She now presents to the office to establish care with me.   HPI: Laura [name] has a history of documented palpitations and remotely has been felt to have a mild nonischemic myopathy. She states she was first told of having sleep apnea 15 years ago by Dr. Narda [name] but never used CPAP therapy. In April 2013, she underwent a sleep study at [facility] for sleep center which was interpreted by Dr. kneeling, and. This reveals severe sleep apnea with AHI of 60. She had significantly reduced oxygen saturation 81% and evidence for severe snoring. A split night protocol was performed that night and she was titrated up to 14 cm pressure. She also was noted to have periodic limb movement disorder of questionable significance. The patient states that she has not really had any significant followup. She's not had any recent download of her CPAP machine. In the past she was noted to have oral venting she presents now for evaluation.  At the time of her sleep study, her Epworth sleepiness scale score indoors to 23 indicative of significant residual daytime sleepiness. In addition, her Laura [name] inventory score indoors at 50 highly suggestive of depression. The patient has taken Laura [name] for residual daytime sleepiness.  The patient has seen Laura [name] on 04/14/2013 and apparently the patient was having issues with some of her psychiatric medications. You were was also noted at palpitations with a Holter monitor showing PVCs during periods of activity, none nocturnally, and a single 6 beat run of atrial  tachycardia. She also has a history of hyperlipidemia, hypertension, hypothyroidism, as well as GERD.   Past Medical History  Diagnosis Date  . Hypothyroidism   . Anemia   . Seasonal allergies   . Depression   . SUI (stress urinary incontinence, female)   . OSA (obstructive sleep apnea) MODERATE PER STUDY 2005    CPAP NONCOMPLIANT  . History of kidney stones   . Nonischemic dilated cardiomyopathy     MODERATELY DEPRESSED LVF;EF 35-45% by Echo 05/27/11  . LBBB (left bundle branch block)   . Left ventricular ejection fraction less than 40%     38% PER CARDIOLOGIST NOTE (DR CROITORU)  . Short of breath on exertion   . Chronic combined systolic and diastolic CHF, NYHA class 2 CARDIOLOGIST-  DR [id number]  . Hypertension   . History of non-ST elevation myocardial infarction (NSTEMI) JUNE 2011    SECONDARY TO TAKOTSUDO SYNDROME (CARDIAC CATH NORMAL)  . Hyperlipemia   . Anxiety   . Asthma     related to sesonal allergies    Past Surgical History  Procedure Laterality Date  . Nasal septum surgery  1980's  . Abdominal hysterectomy  1985    partial  . Cataract extraction w/ intraocular lens  implant, bilateral    . Right ureteroscopic stone extraction  08-31-2000  . Knee arthroscopy w/ meniscectomy  07-27-2011    MEDIAL AND LATERAL  . Transthoracic echocardiogram  05-27-2011  DR CROITORU    MODERATELY DEPRESSED LVF DUE TO GLOBAL HYPOKINESIS AND MARKED SYSTOLIC ASYNCHRONY/ EF 38%/  MILD LEFT ATRIAL DILATATION  . Cardiac catheterization  09-04-1999;  08/25/2004;   12/09/2009  DR CROITORU    NORMAL CORONARIES/  APICAL BALLOONING OF LV CONSISTENT WITH TAKOTSUBO SYMPTOMS/ EF 30-35%  . Pubovaginal sling N/A 09/19/2012    Procedure: SUBURETHRAL Elio Forget;  Surgeon: Laura Fuller, MD;  Location: [name];  Service: Urology;  Laterality: N/A;  . Cystoscopy N/A 09/19/2012    Procedure: CYSTOSCOPY FLEXIBLE;  Surgeon: Laura Fuller, MD;  Location: [name];   Service: Urology;  Laterality: N/A;  . Cholecystectomy N/A 09/29/2012    Procedure: LAPAROSCOPIC CHOLECYSTECTOMY WITH INTRAOPERATIVE CHOLANGIOGRAM;  Surgeon: Laura Sportsman, MD;  Location: [facility];  Service: General;  Laterality: N/A;    Allergies  Allergen Reactions  . Codeine Anaphylaxis, Hives and Other (See Comments)    Headache. Daughter reported that it caused her throat to swell up   . Ace Inhibitors Other (See Comments)    Unknown- it "didn't agree with her"   . Fetzima [Levomilnacipran] Other (See Comments)    "Talking out of my head"  . Lasix [Furosemide] Other (See Comments)    HEADACHE  . Nsaids Other (See Comments)    Told not to take NSAIDs because of her heart     Current Outpatient Prescriptions  Medication Sig Dispense Refill  . acetaminophen (TYLENOL) 325 MG tablet Take 162.5 mg by mouth every 6 (six) hours as needed for pain (for headache.).      Marland Kitchen albuterol (PROVENTIL HFA;VENTOLIN HFA) 108 (90 BASE) MCG/ACT inhaler Inhale 2 puffs into the lungs as needed.       . Armodafinil ([name]) 250 MG tablet Take 125 mg by mouth daily. AM      . atorvastatin (LIPITOR) 80 MG tablet Take 40 mg by mouth every morning.       . bumetanide (BUMEX) 1 MG tablet Take 1 tablet (1 mg total) by mouth every morning.  90 tablet  3  . calcium carbonate (TUMS EX) 750 MG chewable tablet Chew 1 tablet by mouth daily.      . Cholecalciferol (VITAMIN D3) 2000 UNITS capsule Take 4,000 Units by mouth daily.       . diazepam (VALIUM) 10 MG tablet Take 10 mg by mouth every 6 (six) hours as needed for anxiety.      Marland Kitchen estradiol (ESTRACE) 2 MG tablet Take 2 mg by mouth daily.      Marland Kitchen levothyroxine (SYNTHROID, LEVOTHROID) 50 MCG tablet Take 75 mcg by mouth daily. AM      . Linaclotide (LINZESS) 145 MCG CAPS capsule Take 145 mcg by mouth daily.      Marland Kitchen losartan-hydrochlorothiazide (HYZAAR) 100-25 MG per tablet Take 1 tablet by mouth daily.       . metoprolol (TOPROL-XL) 50 MG 24 hr tablet Take 50 mg by  mouth every morning.       Marland Kitchen omeprazole (PRILOSEC) 20 MG capsule Take 1 capsule (20 mg total) by mouth daily.  30 capsule  0  . potassium chloride (K-DUR) 10 MEQ tablet Take 20 mEq by mouth daily.       Marland Kitchen spironolactone (ALDACTONE) 25 MG tablet Take 25 mg by mouth daily.       No current facility-administered medications for this visit.    Social history is notable that she is married has 2 children one grandchild. She does not routinely exercise. Is no tobacco or alcohol use.  Family History  Problem Relation Age of Onset  .  Pneumonia Mother   . Hypertension Mother   . Heart attack Father   . Fibromyalgia Brother   . Pulmonary embolism Brother   . Hypertension Brother     ROS is negative for fever chills or night sweats. She denies skin rash. She denies visual changes. She denies changes in hearing. She denies wheezing. She denies increasing shortness of breath. She doesn't palpitations. She denies chest pressure per she does have GERD symptoms intermittently. She does note some intermittent leg swelling. She does have a history of hypothyroidism. She denies myalgias. She denies claudication. She does have hyperlipidemia. She does have daytime sleepiness and takes individual. She is unaware of breakthrough snoring. She denies painful restless legs but was noted to have nocturnal myoclonus on her sleep study. She does have depression. Time she does have some anxiety .she denies cataplectic spells. She denies paroxysmal. She is unaware of hypnagogic hallucinations. Other comprehensive 14 point system review is negative.  PE BP 110/60  Pulse 72  Ht 5\' 3"  (1.6 m)  Wt 170 lb 1.6 oz (77.157 kg)  BMI 30.14 kg/m2 General: Alert, oriented, no distress.  Skin: normal turgor, no rashes HEENT: Normocephalic, atraumatic. Pupils round and reactive; sclera anicteric; Fundi without hemorrhages or exudates Nose without nasal septal hypertrophy Mouth/Parynx benign; Mallinpatti scale Neck: No JVD, no  carotid bruits Lungs: clear to ausculatation and percussion; no wheezing or rales Heart: RRR, s1 s2 normal possible paradoxically split second sound. No murmurs. Abdomen: soft, nontender; no hepatosplenomehaly, BS+; abdominal aorta nontender and not dilated by palpation. Pulses 2+ Extremities: no clubbinbg cyanosis or edema, Homan's sign negative  Neurologic: grossly nonfocal Psychologic: Normal mood and affect    LABS:  BMET    Component Value Date/Time   NA 134* 12/16/2012 0944   K 3.5 12/16/2012 0944   CL 98 12/16/2012 0944   CO2 26 12/16/2012 0944   GLUCOSE 112* 12/16/2012 0944   BUN 17 12/16/2012 0944   CREATININE 0.86 12/16/2012 0944   CALCIUM 9.3 12/16/2012 0944   GFRNONAA 68* 12/16/2012 0944   GFRAA 79* 12/16/2012 0944     Hepatic Function Panel     Component Value Date/Time   PROT 6.9 10/17/2012 0553   ALBUMIN 3.5 10/17/2012 0553   AST 23 10/17/2012 0553   ALT 22 10/17/2012 0553   ALKPHOS 79 10/17/2012 0553   BILITOT 0.2* 10/17/2012 0553   BILIDIR <0.1 09/28/2012 0417   IBILI NOT CALCULATED 09/28/2012 0417     CBC    Component Value Date/Time   WBC 7.6 12/16/2012 0944   RBC 5.25* 12/16/2012 0944   HGB 13.4 12/16/2012 0944   HCT 39.9 12/16/2012 0944   PLT 245 12/16/2012 0944   MCV 76.0* 12/16/2012 0944   MCH 25.5* 12/16/2012 0944   MCHC 33.6 12/16/2012 0944   RDW 15.1 12/16/2012 0944   LYMPHSABS 1.5 12/16/2012 0944   MONOABS 0.6 12/16/2012 0944   EOSABS 0.1 12/16/2012 0944   BASOSABS 0.0 12/16/2012 0944     BNP    Component Value Date/Time   PROBNP 69.2 09/12/2012 1542    Lipid Panel     Component Value Date/Time   CHOL 154 06/19/2011 0355   TRIG 164* 06/19/2011 0355   HDL 50 06/19/2011 0355   CHOLHDL 3.1 06/19/2011 0355   VLDL 33 06/19/2011 0355   LDLCALC 71 06/19/2011 0355     RADIOLOGY: No results found.   ASSESSMENT AND PLAN: My impression is that Ms. August is a 67 year old female who has documentation  of severe obstructive sleep apnea. She has been  utilizing CPAP therapy and initially was titrated up to 14 cm water pressure. She did have significant nocturnal desaturation. The patient does have episodes of isolated PVCs. She does have daytime sleepiness and also history of depression. She has taken [name] for residual daytime sleepiness. I did review her sleep study findings with her in detail. She now uses a [facility] for her in the company. She apparently has a ResMed unit. I believe she may be currently using a fullface mask. Lasted a [facility] can download of her current unit. I will then see her back in a sleep clinic for followup evaluation.    Laura Bihari, MD, [place] [name] 05/27/2013 11:55 AM

## 2013-06-08 ENCOUNTER — Telehealth: Payer: Self-pay | Admitting: Cardiovascular Disease

## 2013-06-08 NOTE — Telephone Encounter (Signed)
Returned call and pt verified x 2.  Pt informed message received and no samples available.  Pt verbalized understanding.  Pt stated she cannot afford it b/c her insurance won't pay for it b/c it's so expensive.  Stated she needs it b/c it works well w/ her antidepressant.  Pt wants to know if she can be put on some kind of patient assistance program for Nuvigil.  Pt informed our pharmacist, Belenda Cruise, will be notified.  Pt verbalized understanding and agreed w/ plan.  Message forwarded to Belenda Cruise, PharmD.

## 2013-06-08 NOTE — Telephone Encounter (Signed)
Would like samples of Nuvigil 250 mg please.

## 2013-06-13 NOTE — Telephone Encounter (Signed)
Informed pt of possible patient assistance program thru [employer], faxed forms to patient today

## 2013-06-20 ENCOUNTER — Encounter: Payer: Self-pay | Admitting: Physician Assistant

## 2013-07-03 ENCOUNTER — Other Ambulatory Visit: Payer: Self-pay | Admitting: *Deleted

## 2013-07-03 MED ORDER — METOPROLOL SUCCINATE ER 50 MG PO TB24: 50.0000 mg | ORAL_TABLET | ORAL | Status: DC

## 2013-07-09 ENCOUNTER — Other Ambulatory Visit: Payer: Self-pay | Admitting: Internal Medicine

## 2013-07-12 ENCOUNTER — Telehealth: Payer: Self-pay | Admitting: Cardiovascular Disease

## 2013-07-12 NOTE — Telephone Encounter (Signed)
They turned her down for Nuvigil (med for sleep)  Was sent to assistance program by Erasmo Downer.  Wants to know what her other options would be.  Please call

## 2013-07-12 NOTE — Telephone Encounter (Signed)
Forward to [employer]

## 2013-07-12 NOTE — Telephone Encounter (Signed)
Informed patient that Erasmo Downer will call her back. She is aware.

## 2013-07-13 NOTE — Telephone Encounter (Signed)
Spoke with patient - she states cannot afford copay, patient assistance denied due to insurance.  States now she take No-Doz type caffeine tablets when she has to go out, to keep herself awake.  I advised her that this is not a good option for patients with arrhythmias and palpitations.  Suggested if possible to get 1 month supply of Nuvigil and only use on days she has to go out.  Also suggested Provigil, which has a generic and is somewhat cheaper than Nuvigil.  She states Provigil didn't work once before several years ago.  I asked if she would be willing to re-try if Dr. Claiborne Billings approved, and she was willing.  I will review with Dr. Claiborne Billings when he is back, week of 07/24/13

## 2013-07-28 ENCOUNTER — Encounter: Payer: Self-pay | Admitting: Internal Medicine

## 2013-07-28 ENCOUNTER — Ambulatory Visit (INDEPENDENT_AMBULATORY_CARE_PROVIDER_SITE_OTHER): Payer: Medicare Other | Admitting: Internal Medicine

## 2013-07-28 VITALS — BP 128/76 | HR 60 | Temp 98.2°F | Resp 18 | Wt 167.8 lb

## 2013-07-28 DIAGNOSIS — I10 Essential (primary) hypertension: Secondary | ICD-10-CM

## 2013-07-28 DIAGNOSIS — Z79899 Other long term (current) drug therapy: Secondary | ICD-10-CM

## 2013-07-28 DIAGNOSIS — R7309 Other abnormal glucose: Secondary | ICD-10-CM | POA: Diagnosis not present

## 2013-07-28 DIAGNOSIS — E559 Vitamin D deficiency, unspecified: Secondary | ICD-10-CM | POA: Diagnosis not present

## 2013-07-28 DIAGNOSIS — E785 Hyperlipidemia, unspecified: Secondary | ICD-10-CM

## 2013-07-28 DIAGNOSIS — R7303 Prediabetes: Secondary | ICD-10-CM | POA: Insufficient documentation

## 2013-07-28 DIAGNOSIS — F4323 Adjustment disorder with mixed anxiety and depressed mood: Secondary | ICD-10-CM

## 2013-07-28 LAB — HEMOGLOBIN A1C
Hgb A1c MFr Bld: 6.1 % — ABNORMAL HIGH
Mean Plasma Glucose: 128 mg/dL — ABNORMAL HIGH

## 2013-07-28 NOTE — Progress Notes (Signed)
Patient ID: Laura [name], female   DOB: [birth year], 68 y.o.   MRN: [id number]   This very nice 68 y.o. female presents for 3 month follow up with Hypertension, Hyperlipidemia, Pre-Diabetes and Vitamin D Deficiency.    HTN predates since 2009. BP has been controlled at home. Today's BP: 128/76 mmHg. She does has Dx/o Non Ischemic Cardiomyopathy following an admission in 11/2009 when Heart Cath showed normal coronary arteries (Dr Sallyanne Kuster). Patient denies any cardiac type chest pain, palpitations, dyspnea/orthopnea/PND, dizziness, claudication, or dependent edema.   Hyperlipidemia is controlled with diet & meds. Last Cholesterol was  158, Triglycerides were 164, HDL 39 and LDL 86 in Sept 2014 - all at goal. Patient denies myalgias or other med SE's.    Also, the patient has history of PreDiabetes with A1c 6.2% in Mar 2012 and with last A1c of 5.9% in Sept 2014. Patient denies any symptoms of reactive hypoglycemia, diabetic polys, paresthesias or visual blurring. In 2010 she was Dx'd Hypothyroid and has since been on replacement therapy.    Patient also has long Hx/o chronic anxiety and depression and has been followed by Dr Toy Care in the past. She has Hx/o multiple psych med intolerances including Celexa, Xanax XR, Brintellix, and Fetzima. Currently she is on Lexapro and feels improved, but still is occasionally overwhelmed by anxiety attacks.   Further, Patient has history of Vitamin D Deficiency of 24 in 2012 and with last vitamin D still low at 3 in Sept. Patient supplements vitamin D without any suspected side-effects.    Medication List       This list is accurate as of: 07/28/13  2:53 PM.  Always use your most recent med list.               acetaminophen 325 MG tablet  Commonly known as:  TYLENOL  Take 162.5 mg by mouth every 6 (six) hours as needed for pain (for headache.).     albuterol 108 (90 BASE) MCG/ACT inhaler  Commonly known as:  PROVENTIL HFA;VENTOLIN HFA  Inhale 2 puffs  into the lungs as needed.     atorvastatin 80 MG tablet  Commonly known as:  LIPITOR  Take 40 mg by mouth every morning.     bumetanide 1 MG tablet  Commonly known as:  BUMEX  Take 1 tablet (1 mg total) by mouth every morning.     calcium carbonate 750 MG chewable tablet  Commonly known as:  TUMS EX  Chew 1 tablet by mouth daily.     diazepam 10 MG tablet  Commonly known as:  VALIUM  Take 10 mg by mouth every 6 (six) hours as needed for anxiety.     escitalopram 20 MG tablet  Commonly known as:  LEXAPRO  20 mg daily.     estradiol 2 MG tablet  Commonly known as:  ESTRACE  Take 2 mg by mouth daily.     KLOR-CON M10 10 MEQ tablet  Generic drug:  potassium chloride     levothyroxine 75 MCG tablet  Commonly known as:  SYNTHROID, LEVOTHROID  Take 75 mcg by mouth daily before breakfast.     LINZESS 145 MCG Caps capsule  Generic drug:  Linaclotide  Take 145 mcg by mouth daily.     losartan-hydrochlorothiazide 100-25 MG per tablet  Commonly known as:  HYZAAR  Take 1 tablet by mouth daily.     metoprolol succinate 50 MG 24 hr tablet  Commonly known as:  TOPROL-XL  Take 1 tablet (  50 mg total) by mouth every morning.     NUVIGIL 250 MG tablet  Generic drug:  Armodafinil  TAKE 1 TABLET EVERY MORNING FOR MOOD/DEPRESSION     omeprazole 20 MG capsule  Commonly known as:  PRILOSEC  Take 1 capsule (20 mg total) by mouth daily.     potassium chloride 10 MEQ tablet  Commonly known as:  K-DUR  Take 20 mEq by mouth daily.     spironolactone 25 MG tablet  Commonly known as:  ALDACTONE  Take 25 mg by mouth daily.     Vitamin D3 2000 UNITS capsule  Take 4,000 Units by mouth daily.         Allergies  Allergen Reactions  . Codeine Anaphylaxis, Hives and Other (See Comments)    Headache. Daughter reported that it caused her throat to swell up   . Ace Inhibitors Other (See Comments)    Unknown- it "didn't agree with her"   . Brintellix [Vortioxetine] Nausea Only  .  Citalopram     Fatigue  . Fetzima [Levomilnacipran] Other (See Comments)    "Talking out of my head"  . Lasix [Furosemide] Other (See Comments)    HEADACHE  . Nsaids Other (See Comments)    Told not to take NSAIDs because of her heart   . Xanax Starla Link Er] Other (See Comments)    confusion    PMHx:   Past Medical History  Diagnosis Date  . Hypothyroidism   . Anemia   . Seasonal allergies   . Depression   . SUI (stress urinary incontinence, female)   . OSA (obstructive sleep apnea) MODERATE PER STUDY 2005    CPAP NONCOMPLIANT  . History of kidney stones   . Nonischemic dilated cardiomyopathy     MODERATELY DEPRESSED LVF;EF 35-45% by Echo 05/27/11  . LBBB (left bundle branch block)   . Left ventricular ejection fraction less than 40%     38% PER CARDIOLOGIST NOTE (DR CROITORU)  . Short of breath on exertion   . Chronic combined systolic and diastolic CHF, NYHA class 2 CARDIOLOGIST-  DR WCHENIDP  . Hypertension   . History of non-ST elevation myocardial infarction (NSTEMI) JUNE 2011    SECONDARY TO TAKOTSUDO SYNDROME (CARDIAC CATH NORMAL)  . Hyperlipemia   . Anxiety   . Asthma     related to sesonal allergies    FHx:    Reviewed / unchanged  SHx:    Reviewed / unchanged  Systems Review: Constitutional: Denies fever, chills, wt changes, headaches, insomnia, fatigue, night sweats, change in appetite. Eyes: Denies redness, blurred vision, diplopia, discharge, itchy, watery eyes.  ENT: Denies discharge, congestion, post nasal drip, epistaxis, sore throat, earache, hearing loss, dental pain, tinnitus, vertigo, sinus pain, snoring.  CV: Denies chest pain, palpitations, irregular heartbeat, syncope, dyspnea, diaphoresis, orthopnea, PND, claudication, edema. Respiratory: denies cough, dyspnea, DOE, pleurisy, hoarseness, laryngitis, wheezing.  Gastrointestinal: Denies dysphagia, odynophagia, heartburn, reflux, water brash, abdominal pain or cramps, nausea, vomiting,  bloating, diarrhea, constipation, hematemesis, melena, hematochezia,  or hemorrhoids. Genitourinary: Denies dysuria, frequency, urgency, nocturia, hesitancy, discharge, hematuria, flank pain. Musculoskeletal: Denies arthralgias, myalgias, stiffness, jt. swelling, pain, limp, strain/sprain.  Skin: Denies pruritus, rash, hives, warts, acne, eczema, change in skin lesion(s). Neuro: No weakness, tremor, incoordination, spasms, paresthesia, or pain. Psychiatric: Denies confusion, memory loss, or sensory loss. Endo: Denies change in weight, skin, hair change.  Heme/Lymph: No excessive bleeding, bruising, orenlarged lymph nodes.  Filed Vitals:   07/28/13 1125  BP: 128/76  Pulse: 60  Temp: 98.2 F (36.8 C)  Resp: 18    Estimated body mass index is 29.73 kg/(m^2) as calculated from the following:   Height as of 04/25/13: 5\' 3"  (1.6 m).   Weight as of this encounter: 167 lb 12.8 oz (76.114 kg).  On Exam: Appears well nourished - in no distress. Eyes: PERRLA, EOMs, conjunctiva no swelling or erythema. Sinuses: No frontal/maxillary tenderness ENT/Mouth: EAC's clear, TM's nl w/o erythema, bulging. Nares clear w/o erythema, swelling, exudates. Oropharynx clear without erythema or exudates. Oral hygiene is good. Tongue normal, non obstructing. Hearing intact.  Neck: Supple. Thyroid nl. Car 2+/2+ without bruits, nodes or JVD. Chest: Respirations nl with BS clear & equal w/o rales, rhonchi, wheezing or stridor.  Cor: Heart sounds normal w/ regular rate and rhythm without sig. murmurs, gallops, clicks, or rubs. Peripheral pulses normal and equal  without edema.  Abdomen: Soft & bowel sounds normal. Non-tender w/o guarding, rebound, hernias, masses, or organomegaly.  Lymphatics: Unremarkable.  Musculoskeletal: Full ROM all peripheral extremities, joint stability, 5/5 strength, and normal gait.  Skin: Warm, dry without exposed rashes, lesions, ecchymosis apparent.  Neuro: Cranial nerves intact,  reflexes equal bilaterally. Sensory-motor testing grossly intact. Tendon reflexes grossly intact.  Pysch: Alert & oriented x 3. Insight and judgement nl & appropriate. No ideations.  Assessment and Plan:  1. Hypertension - Continue monitor blood pressure at home. Continue diet/meds same.  2. Hyperlipidemia - Continue diet/meds, exercise,& lifestyle modifications. Continue monitor periodic cholesterol/liver & renal functions   3. Pre-diabetes - Continue diet, exercise, lifestyle modifications. Monitor appropriate labs.  4. Vitamin D Deficiency - Continue supplementation  5. Hypothyroidism  6. Chronc Anxiety and Depression  Recommended regular exercise, BP monitoring, weight control, and discussed med and SE's. Recommended labs to assess and monitor clinical status. Further disposition pending results of labs. Patient requests Psych referral fo medication management.

## 2013-07-29 LAB — CBC WITH DIFFERENTIAL/PLATELET
BASOS ABS: 0.1 10*3/uL (ref 0.0–0.1)
Basophils Relative: 1 % (ref 0–1)
EOS PCT: 4 % (ref 0–5)
Eosinophils Absolute: 0.2 10*3/uL (ref 0.0–0.7)
HEMATOCRIT: 38.5 % (ref 36.0–46.0)
Hemoglobin: 12.4 g/dL (ref 12.0–15.0)
LYMPHS ABS: 1.9 10*3/uL (ref 0.7–4.0)
LYMPHS PCT: 28 % (ref 12–46)
MCH: 25.8 pg — ABNORMAL LOW (ref 26.0–34.0)
MCHC: 32.2 g/dL (ref 30.0–36.0)
MCV: 80 fL (ref 78.0–100.0)
Monocytes Absolute: 0.5 10*3/uL (ref 0.1–1.0)
Monocytes Relative: 7 % (ref 3–12)
NEUTROS ABS: 4.1 10*3/uL (ref 1.7–7.7)
Neutrophils Relative %: 60 % (ref 43–77)
PLATELETS: 283 10*3/uL (ref 150–400)
RBC: 4.81 MIL/uL (ref 3.87–5.11)
RDW: 14.9 % (ref 11.5–15.5)
WBC: 6.8 10*3/uL (ref 4.0–10.5)

## 2013-07-29 LAB — BASIC METABOLIC PANEL WITH GFR
BUN: 23 mg/dL (ref 6–23)
CHLORIDE: 98 meq/L (ref 96–112)
CO2: 32 meq/L (ref 19–32)
CREATININE: 0.87 mg/dL (ref 0.50–1.10)
Calcium: 9.5 mg/dL (ref 8.4–10.5)
GFR, EST NON AFRICAN AMERICAN: 69 mL/min
GFR, Est African American: 79 mL/min
Glucose, Bld: 94 mg/dL (ref 70–99)
POTASSIUM: 4.1 meq/L (ref 3.5–5.3)
Sodium: 139 mEq/L (ref 135–145)

## 2013-07-29 LAB — TSH: TSH: 0.801 u[IU]/mL (ref 0.350–4.500)

## 2013-07-29 LAB — HEPATIC FUNCTION PANEL
ALBUMIN: 4.2 g/dL (ref 3.5–5.2)
ALK PHOS: 52 U/L (ref 39–117)
ALT: 20 U/L (ref 0–35)
AST: 25 U/L (ref 0–37)
BILIRUBIN DIRECT: 0.1 mg/dL (ref 0.0–0.3)
BILIRUBIN INDIRECT: 0.2 mg/dL (ref 0.2–1.2)
BILIRUBIN TOTAL: 0.3 mg/dL (ref 0.2–1.2)
Total Protein: 7 g/dL (ref 6.0–8.3)

## 2013-07-29 LAB — LIPID PANEL
CHOL/HDL RATIO: 3.1 ratio
Cholesterol: 144 mg/dL (ref 0–200)
HDL: 46 mg/dL (ref >39)
LDL CALC: 78 mg/dL (ref 0–99)
TRIGLYCERIDES: 101 mg/dL (ref <150)
VLDL: 20 mg/dL (ref 0–40)

## 2013-07-29 LAB — VITAMIN D 25 HYDROXY (VIT D DEFICIENCY, FRACTURES): Vit D, 25-Hydroxy: 52 ng/mL (ref 30–89)

## 2013-07-29 LAB — INSULIN, FASTING: INSULIN FASTING, SERUM: 11 u[IU]/mL (ref 3–28)

## 2013-07-29 LAB — MAGNESIUM: Magnesium: 1.9 mg/dL (ref 1.5–2.5)

## 2013-08-10 ENCOUNTER — Other Ambulatory Visit: Payer: Self-pay | Admitting: Emergency Medicine

## 2013-08-10 ENCOUNTER — Ambulatory Visit: Payer: Self-pay | Admitting: Internal Medicine

## 2013-08-10 MED ORDER — ARMODAFINIL 250 MG PO TABS: 250.0000 mg | ORAL_TABLET | Freq: Every day | ORAL | Status: DC

## 2013-08-16 ENCOUNTER — Other Ambulatory Visit: Payer: Self-pay | Admitting: Physician Assistant

## 2013-08-26 ENCOUNTER — Other Ambulatory Visit: Payer: Self-pay | Admitting: Physician Assistant

## 2013-08-30 DIAGNOSIS — K602 Anal fissure, unspecified: Secondary | ICD-10-CM | POA: Diagnosis not present

## 2013-08-30 DIAGNOSIS — K6289 Other specified diseases of anus and rectum: Secondary | ICD-10-CM | POA: Diagnosis not present

## 2013-08-30 DIAGNOSIS — L290 Pruritus ani: Secondary | ICD-10-CM | POA: Diagnosis not present

## 2013-09-20 DIAGNOSIS — H35379 Puckering of macula, unspecified eye: Secondary | ICD-10-CM | POA: Diagnosis not present

## 2013-09-20 DIAGNOSIS — H26499 Other secondary cataract, unspecified eye: Secondary | ICD-10-CM | POA: Diagnosis not present

## 2013-09-20 DIAGNOSIS — H43819 Vitreous degeneration, unspecified eye: Secondary | ICD-10-CM | POA: Diagnosis not present

## 2013-09-22 ENCOUNTER — Ambulatory Visit (INDEPENDENT_AMBULATORY_CARE_PROVIDER_SITE_OTHER): Payer: Medicare Other | Admitting: Psychiatry

## 2013-09-22 VITALS — BP 128/63 | HR 57 | Ht 63.0 in | Wt 175.2 lb

## 2013-09-22 DIAGNOSIS — F332 Major depressive disorder, recurrent severe without psychotic features: Secondary | ICD-10-CM | POA: Diagnosis not present

## 2013-09-22 MED ORDER — ESCITALOPRAM OXALATE 20 MG PO TABS: 20.0000 mg | ORAL_TABLET | Freq: Every day | ORAL | Status: DC

## 2013-09-22 MED ORDER — DIAZEPAM 10 MG PO TABS: ORAL_TABLET | ORAL | Status: DC

## 2013-09-22 NOTE — Progress Notes (Signed)
Psychiatric Assessment Adult  Patient Identification:  Laura [name] Date of Evaluation:  09/22/2013 Chief Complaint: Anxious This patient is a 68 year old married mother has been diagnosed with chronic depression and has been under the care of Dr.Kaur for years. She says she wants a new provider because her previous psychiatrist charged her $90 just to write her prescriptions. Her previous psychiatrist does not take Medicare. Generally the patient is doing fairly well. A month ago she claims she been persistently depressed for the previous year. Her mood is improved for reasons that are not clear. The patient is happily married 39 years to her husband who is not physically well. She is to stable children and one grandchild is doing great. One of the biggest stresses and traumas in this patient's life is the acute death of her mother due to an anaphylactic reaction to penicillin. This is based upon the patient's understanding of why she died. The patient talked extensively about her mother's death and in fact typically was circumstantial in her thought process. Her story is on with great detail.. Financially the patient is doing okay.  At this time therefore the patient while she describes herself as being moody she denies daily depression. What is significant is the fact that she seems to be anhedonic. She sleeps because she is on a CPAP machine. She does not take naps because she takes Nuvigil which helps that. Her appetite is good as is her energy. The patient energy she says his only good because she takes Nuvigil. The patient's thinking and concentration is good. The patient denies being worthless and denies being suicidal. The patient did make a suicide attempt in [place] but was stopped by her mother. This patient denies the use of alcohol. She's never had any psychotic symptoms. It is difficult to get a clear history of a distinct episode of major depression. It seems that her  depression seems to run together and that in general this patient is a poor historian about her so she did symptoms of depression. Much of the associated symptoms of depression are complicated by the fact that she had an extensive gallbladder procedure over the last year or 2 which affected her energy and her appetite. Nonetheless at this time she is doing better. The patient says her previous psychiatrist tried her on multiple medications none of which she can remember. The patient has had one psychiatric hospitalization but was over 20 years ago for depression. This patient does not smoke she is out of high school education. She has 2 siblings which is very close with. The patient shares with me that she does not trust doctors. It is noted the patient has never been in psychotherapy. History of Chief Complaint:  No chief complaint on file.   HPI Review of Systems Physical Exam  Depressive Symptoms: anhedonia,  (Hypo) Manic Symptoms:   Elevated Mood:  No Irritable Mood:  No Grandiosity:  No Distractibility:  No Labiality of Mood:  No Delusions:  No Hallucinations:  No Impulsivity:  No Sexually Inappropriate Behavior:  No Financial Extravagance:   Flight of Ideas:    Anxiety Symptoms: Excessive Worry:  No Panic Symptoms:  No Agoraphobia:  No Obsessive Compulsive: No  Symptoms: None, Specific Phobias:  No Social Anxiety:  No  Psychotic Symptoms:  Hallucinations: No None Delusions:  No Paranoia:  No   Ideas of Reference:  No  PTSD Symptoms: Ever had a traumatic exposure:  No Had a traumatic exposure in the last month:  No Re-experiencing:   Hypervigilance:   Hyperarousal:   Avoidance:    Traumatic Brain Injury:    Past Psychiatric History: Diagnosis: Major depression   Hospitalizations: One time over 20 years ago   Outpatient Care: None   Substance Abuse Care:   Self-Mutilation:   Suicidal Attempts:   Violent Behaviors:    Past Medical History:   Past Medical  History  Diagnosis Date  . Hypothyroidism   . Anemia   . Seasonal allergies   . Depression   . SUI (stress urinary incontinence, female)   . OSA (obstructive sleep apnea) MODERATE PER STUDY 2005    CPAP NONCOMPLIANT  . History of kidney stones   . Nonischemic dilated cardiomyopathy     MODERATELY DEPRESSED LVF;EF 35-45% by Echo 05/27/11  . LBBB (left bundle branch block)   . Left ventricular ejection fraction less than 40%     38% PER CARDIOLOGIST NOTE (DR CROITORU)  . Short of breath on exertion   . Chronic combined systolic and diastolic CHF, NYHA class 2 CARDIOLOGIST-  DR [id number]  . Hypertension   . History of non-ST elevation myocardial infarction (NSTEMI) JUNE 2011    SECONDARY TO TAKOTSUDO SYNDROME (CARDIAC CATH NORMAL)  . Hyperlipemia   . Anxiety   . Asthma     related to sesonal allergies   History of Loss of Consciousness:   Seizure History:   Cardiac History:   Allergies:   Allergies  Allergen Reactions  . Codeine Anaphylaxis, Hives and Other (See Comments)    Headache. Daughter reported that it caused her throat to swell up   . Ace Inhibitors Other (See Comments)    Unknown- it "didn't agree with her"   . Brintellix [Vortioxetine] Nausea Only  . Citalopram     Fatigue  . Fetzima [Levomilnacipran] Other (See Comments)    "Talking out of my head"  . Lasix [Furosemide] Other (See Comments)    HEADACHE  . Nsaids Other (See Comments)    Told not to take NSAIDs because of her heart   . Xanax Xr [Alprazolam Er] Other (See Comments)    confusion   Current Medications:  Current Outpatient Prescriptions  Medication Sig Dispense Refill  . acetaminophen (TYLENOL) 325 MG tablet Take 162.5 mg by mouth every 6 (six) hours as needed for pain (for headache.).      Marland Kitchen albuterol (PROVENTIL HFA;VENTOLIN HFA) 108 (90 BASE) MCG/ACT inhaler Inhale 2 puffs into the lungs as needed.       . Armodafinil (NUVIGIL) 250 MG tablet Take 1 tablet (250 mg total) by mouth daily.  30  tablet  1  . atorvastatin (LIPITOR) 80 MG tablet Take 40 mg by mouth every morning.       . bumetanide (BUMEX) 1 MG tablet Take 1 tablet (1 mg total) by mouth every morning.  90 tablet  3  . calcium carbonate (TUMS EX) 750 MG chewable tablet Chew 1 tablet by mouth daily.      . Cholecalciferol (VITAMIN D3) 2000 UNITS capsule Take 4,000 Units by mouth daily.       . diazepam (VALIUM) 10 MG tablet 12 bid  1  prn  90 tablet  5  . escitalopram (LEXAPRO) 20 MG tablet Take 1 tablet (20 mg total) by mouth daily.  30 tablet  5  . estradiol (ESTRACE) 2 MG tablet TAKE ONE TABLET BY MOUTH DAILY  90 tablet  0  . KLOR-CON M10 10 MEQ tablet       .  levothyroxine (SYNTHROID, LEVOTHROID) 75 MCG tablet Take 75 mcg by mouth daily before breakfast.      . Linaclotide (LINZESS) 145 MCG CAPS capsule Take 145 mcg by mouth daily.      Marland Kitchen losartan-hydrochlorothiazide (HYZAAR) 100-25 MG per tablet Take 1 tablet by mouth daily.       . metoprolol succinate (TOPROL-XL) 50 MG 24 hr tablet Take 1 tablet (50 mg total) by mouth every morning.  90 tablet  3  . omeprazole (PRILOSEC) 20 MG capsule Take 1 capsule (20 mg total) by mouth daily.  30 capsule  0  . potassium chloride (K-DUR) 10 MEQ tablet Take 20 mEq by mouth daily.       Marland Kitchen spironolactone (ALDACTONE) 25 MG tablet TAKE 1 TABLET EVERY DAY  90 tablet  1   No current facility-administered medications for this visit.    Previous Psychotropic Medications:  Medication Dose   Lexapro 20 mg     Valium 10 mg 4 times a day when necessary    Nuvigil                 Substance Abuse History in the last 12 months:  Legal Consequences of Substance Abuse:   Family Consequences of Substance Abuse:   Blackouts:   DT's:   Withdrawal Symptoms:     Social History: Current Place of Residence: [employer] of Birth:  Family Members:  Marital Status:  Married Children: 2  Sons:   Daughters:  Relationships:  Education:  HS Soil scientist  Problems/Performance:  Religious Beliefs/Practices:  History of Abuse:  Ship broker History:   Legal History:  Hobbies/Interests:   Family History:   Family History  Problem Relation Age of Onset  . Pneumonia Mother   . Hypertension Mother   . Heart attack Father   . Fibromyalgia Brother   . Pulmonary embolism Brother   . Hypertension Brother     Mental Status Examination/Evaluation: Objective:  Appearance: Casual  Eye Contact::  Good  Speech:  Clear and Coherent  Volume:  Normal  Mood:  flat  Affect:  Appropriate  Thought Process:  Coherent  Orientation:  Full (Time, Place, and Person)  Thought nl  Suicidal Thoughts:  No  Homicidal Thoughts:  No  Judgement:  Good  Insight:  Good  Psychomotor Activity:  Normal  Akathisia:  No  Handed:  Right  AIMS (if indicated):   Assets:  Communication Skills    Laboratory/X-Ray Psychological Evaluation(s)        Assessment:  Axis I: Major Depression, Recurrent severe  AXIS I Major Depression, Recurrent severe  AXIS II Deferred  AXIS III Past Medical History  Diagnosis Date  . Hypothyroidism   . Anemia   . Seasonal allergies   . Depression   . SUI (stress urinary incontinence, female)   . OSA (obstructive sleep apnea) MODERATE PER STUDY 2005    CPAP NONCOMPLIANT  . History of kidney stones   . Nonischemic dilated cardiomyopathy     MODERATELY DEPRESSED LVF;EF 35-45% by Echo 05/27/11  . LBBB (left bundle branch block)   . Left ventricular ejection fraction less than 40%     38% PER CARDIOLOGIST NOTE (DR CROITORU)  . Short of breath on exertion   . Chronic combined systolic and diastolic CHF, NYHA class 2 CARDIOLOGIST-  DR [id number]  . Hypertension   . History of non-ST elevation myocardial infarction (NSTEMI) JUNE 2011    SECONDARY TO TAKOTSUDO SYNDROME (CARDIAC CATH NORMAL)  . Hyperlipemia   .  Anxiety   . Asthma     related to sesonal allergies     AXIS IV other psychosocial or  environmental problems  AXIS V 61-70 mild symptoms   Treatment Plan/Recommendations:  Plan of Care: At this time we shall continue 20 mg of Lexapro every day. I am very hesitant to change her antidepressant. Her descriptions of of her side effects seem strange and unusual. There inconsistent with anything I understand to be side effects. 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 Today she agreed to reduce her Valium to take a dose of 10 mg twice a day with 1 when necessary. I do not think this patient would abuse this agent. The medicine Nuvigil as prescribed by her primary care doctor and that she'll continue I will not be prescribing this agent. It is appropriate that she does receive that she does have a sleep disorder. The possibility of psychotherapy the future would be the only intervention I would do I would not be changing any of her antidepressants. Nonetheless I still will get a release of information to get her previous psychiatrist notes. This patient to return to see me in 3 months.    Laboratory:    Psychotherapy:   Medications: Lexapro 20 mg, Valium 10 mg 4 times a day when necessary   Routine PRN Medications:  No  Consultations:   Safety Concerns:    Other:      Haskel Schroeder, MD 3/27/20159:23 AM

## 2013-10-04 ENCOUNTER — Other Ambulatory Visit: Payer: Self-pay | Admitting: Internal Medicine

## 2013-10-04 ENCOUNTER — Other Ambulatory Visit: Payer: Self-pay | Admitting: Physician Assistant

## 2013-10-19 ENCOUNTER — Other Ambulatory Visit (HOSPITAL_COMMUNITY): Payer: Self-pay | Admitting: *Deleted

## 2013-10-19 MED ORDER — ESCITALOPRAM OXALATE 20 MG PO TABS: 20.0000 mg | ORAL_TABLET | Freq: Every day | ORAL | Status: DC

## 2013-10-29 ENCOUNTER — Other Ambulatory Visit: Payer: Self-pay | Admitting: Internal Medicine

## 2013-10-29 ENCOUNTER — Other Ambulatory Visit: Payer: Self-pay | Admitting: Emergency Medicine

## 2013-11-03 ENCOUNTER — Ambulatory Visit: Payer: Self-pay | Admitting: Physician Assistant

## 2013-11-08 ENCOUNTER — Ambulatory Visit (INDEPENDENT_AMBULATORY_CARE_PROVIDER_SITE_OTHER): Payer: Medicare Other | Admitting: Cardiovascular Disease

## 2013-11-08 ENCOUNTER — Encounter: Payer: Self-pay | Admitting: Cardiovascular Disease

## 2013-11-08 VITALS — BP 108/70 | HR 63 | Ht 63.0 in | Wt 170.6 lb

## 2013-11-08 DIAGNOSIS — F332 Major depressive disorder, recurrent severe without psychotic features: Secondary | ICD-10-CM

## 2013-11-08 DIAGNOSIS — G4733 Obstructive sleep apnea (adult) (pediatric): Secondary | ICD-10-CM | POA: Diagnosis not present

## 2013-11-08 DIAGNOSIS — E785 Hyperlipidemia, unspecified: Secondary | ICD-10-CM

## 2013-11-08 DIAGNOSIS — R079 Chest pain, unspecified: Secondary | ICD-10-CM | POA: Diagnosis not present

## 2013-11-08 DIAGNOSIS — I428 Other cardiomyopathies: Secondary | ICD-10-CM

## 2013-11-08 DIAGNOSIS — I429 Cardiomyopathy, unspecified: Secondary | ICD-10-CM

## 2013-11-08 NOTE — Progress Notes (Signed)
Patient ID: Laura [name], female   DOB: 1946-02-28, 68 y.o.   MRN: [id number]      Reason for office visit Nonischemic cardiomyopathy, left bundle branch block, hypertension, PVCs  Laura [name] does not have complaints of shortness of breath, syncope, new neurological issues or any change in her occasional palpitations. She has intermittent (almost daily) episodes of chest discomfort. These uniformly happen at rest and never with exercise. Her biggest complaints are related to anxiety, depression and fatigue. She says that many days "she doesn't really care she lives or dies" and that if it wasn't for her family "she would have no reason to carry on". She blames her depression on her mother's demise 2-1/2 years ago, but her problems and depression date back much farther than that. She has seen numerous psychiatrists and has not found one that has provided satisfactory treatment yet.  Laura [name] has chronic nonischemic cardiomyopathy. When she first presented in 2011 she had chest pain and normal coronaries by angiography. She was felt to have takotsubo syndrome. However, her left ventricular ejection fraction never improved and she was subsequently diagnosed with dilated nonischemic cardiomyopathy. Her ejection fraction has been as low as 25% but on appropriate heart failure medications has increased to 45-50%. She has a history of frequent PVCs that are intermittently symptomatic. She has obstructive sleep apnea and is wearing CPAP. She was evaluated by Dr. Claiborne Billings last October in the sleep clinic.    Allergies  Allergen Reactions  . Codeine Anaphylaxis, Hives and Other (See Comments)    Headache. Daughter reported that it caused her throat to swell up   . Ace Inhibitors Other (See Comments)    Unknown- it "didn't agree with her"   . Brintellix [Vortioxetine] Nausea Only  . Citalopram     Fatigue  . Fetzima [Levomilnacipran] Other (See Comments)    "Talking out of my head"  . Lasix [Furosemide] Other  (See Comments)    HEADACHE  . Nsaids Other (See Comments)    Told not to take NSAIDs because of her heart   . Xanax Starla Link Er] Other (See Comments)    confusion    Current Outpatient Prescriptions  Medication Sig Dispense Refill  . acetaminophen (TYLENOL) 325 MG tablet Take 162.5 mg by mouth every 6 (six) hours as needed for pain (for headache.).      Marland Kitchen albuterol (PROVENTIL HFA;VENTOLIN HFA) 108 (90 BASE) MCG/ACT inhaler Inhale 2 puffs into the lungs as needed.       Marland Kitchen atorvastatin (LIPITOR) 80 MG tablet Take 40 mg by mouth every morning.       . bumetanide (BUMEX) 1 MG tablet Take 1 tablet (1 mg total) by mouth every morning.  90 tablet  3  . calcium carbonate (TUMS EX) 750 MG chewable tablet Chew 1 tablet by mouth daily.      . Cholecalciferol (VITAMIN D3) 2000 UNITS capsule Take 4,000 Units by mouth daily.       . diazepam (VALIUM) 10 MG tablet 12 bid  1  prn  90 tablet  5  . escitalopram (LEXAPRO) 20 MG tablet Take 1 tablet (20 mg total) by mouth daily.  90 tablet  1  . estradiol (ESTRACE) 2 MG tablet TAKE ONE TABLET BY MOUTH DAILY  90 tablet  0  . levothyroxine (SYNTHROID, LEVOTHROID) 75 MCG tablet TAKE 1 TABLET BY MOUTH DAILY  90 tablet  1  . Linaclotide (LINZESS) 145 MCG CAPS capsule Take 145 mcg by mouth daily.      Marland Kitchen  losartan-hydrochlorothiazide (HYZAAR) 100-25 MG per tablet Take 1 tablet by mouth daily.       . metoprolol succinate (TOPROL-XL) 50 MG 24 hr tablet Take 1 tablet (50 mg total) by mouth every morning.  90 tablet  3  . NUVIGIL 250 MG tablet TAKE 1 TABLET BY MOUTH EVERY DAY IN THE MORNING  30 tablet  1  . omeprazole (PRILOSEC) 20 MG capsule TAKE ONE CAPSULE BY MOUTH EVERY DAY  90 capsule  2  . potassium chloride (K-DUR) 10 MEQ tablet Take 20 mEq by mouth daily.       Marland Kitchen spironolactone (ALDACTONE) 25 MG tablet TAKE 1 TABLET EVERY DAY  90 tablet  1   No current facility-administered medications for this visit.    Past Medical History  Diagnosis Date  .  Hypothyroidism   . Anemia   . Seasonal allergies   . Depression   . SUI (stress urinary incontinence, female)   . OSA (obstructive sleep apnea) MODERATE PER STUDY 2005    CPAP NONCOMPLIANT  . History of kidney stones   . Nonischemic dilated cardiomyopathy     MODERATELY DEPRESSED LVF;EF 35-45% by Echo 05/27/11  . LBBB (left bundle branch block)   . Left ventricular ejection fraction less than 40%     38% PER CARDIOLOGIST NOTE (DR Junice Fei)  . Short of breath on exertion   . Chronic combined systolic and diastolic CHF, NYHA class 2 CARDIOLOGIST-  DR [id number]  . Hypertension   . History of non-ST elevation myocardial infarction (NSTEMI) JUNE 2011    SECONDARY TO TAKOTSUDO SYNDROME (CARDIAC CATH NORMAL)  . Hyperlipemia   . Anxiety   . Asthma     related to sesonal allergies    Past Surgical History  Procedure Laterality Date  . Nasal septum surgery  1980's  . Abdominal hysterectomy  1985    partial  . Cataract extraction w/ intraocular lens  implant, bilateral    . Right ureteroscopic stone extraction  08-31-2000  . Knee arthroscopy w/ meniscectomy  07-27-2011    MEDIAL AND LATERAL  . Transthoracic echocardiogram  05-27-2011  DR Lusero Nordlund    MODERATELY DEPRESSED LVF DUE TO GLOBAL HYPOKINESIS AND MARKED SYSTOLIC ASYNCHRONY/ EF 12%/ MILD LEFT ATRIAL DILATATION  . Cardiac catheterization  09-04-1999;  08/25/2004;   12/09/2009  DR Jeanann Balinski    NORMAL CORONARIES/  APICAL BALLOONING OF LV CONSISTENT WITH TAKOTSUBO SYMPTOMS/ EF 30-35%  . Pubovaginal sling N/A 09/19/2012    Procedure: SUBURETHRAL Janyth Pupa;  Surgeon: Bernestine Amass, MD;  Location: [facility];  Service: Urology;  Laterality: N/A;  . Cystoscopy N/A 09/19/2012    Procedure: CYSTOSCOPY FLEXIBLE;  Surgeon: Bernestine Amass, MD;  Location: [facility];  Service: Urology;  Laterality: N/A;  . Cholecystectomy N/A 09/29/2012    Procedure: LAPAROSCOPIC CHOLECYSTECTOMY WITH INTRAOPERATIVE CHOLANGIOGRAM;   Surgeon: Adin Hector, MD;  Location: [facility];  Service: General;  Laterality: N/A;    Family History  Problem Relation Age of Onset  . Pneumonia Mother   . Hypertension Mother   . Heart attack Father   . Fibromyalgia Brother   . Pulmonary embolism Brother   . Hypertension Brother     History   Social History  . Marital Status: Married    Spouse Name: N/A    Number of Children: N/A  . Years of Education: N/A   Occupational History  . Not on file.   Social History Main Topics  . Smoking status: Never  Smoker   . Smokeless tobacco: Never Used  . Alcohol Use: No  . Drug Use: No  . Sexual Activity: No   Other Topics Concern  . Not on file   Social History Narrative  . No narrative on file    Review of systems: The patient specifically denies any chest pain with exertion, dyspnea at rest or with usual exertion, orthopnea, paroxysmal nocturnal dyspnea, syncope, focal neurological deficits, intermittent claudication, lower extremity edema, unexplained weight gain, cough, hemoptysis or wheezing.  The patient also denies abdominal pain, nausea, vomiting, dysphagia, diarrhea, constipation, polyuria, polydipsia, dysuria, hematuria, frequency, urgency, abnormal bleeding or bruising, fever, chills, unexpected weight changes, mood swings, change in skin or hair texture, change in voice quality, auditory or visual problems, allergic reactions or rashes, new musculoskeletal complaints other than usual "aches and pains".   PHYSICAL EXAM BP 108/70  Pulse 63  Ht 5\' 3"  (1.6 m)  Wt 170 lb 9.6 oz (77.384 kg)  BMI 30.23 kg/m2 General: Alert, oriented x3, no distress  Head: no evidence of trauma, PERRL, EOMI, no exophtalmos or lid lag, no myxedema, no xanthelasma; normal ears, nose and oropharynx  Neck: normal jugular venous pulsations and no hepatojugular reflux; brisk carotid pulses without delay and no carotid bruits  Chest: clear to auscultation, no signs of consolidation by  percussion or palpation, normal fremitus, symmetrical and full respiratory excursions  Cardiovascular: normal position and quality of the apical impulse, regular rhythm, normal first and paradoxically split second heart sounds, no murmurs, rubs or gallops  Abdomen: no tenderness or distention, no masses by palpation, no abnormal pulsatility or arterial bruits, normal bowel sounds, no hepatosplenomegaly  Extremities: no clubbing, cyanosis or edema; 2+ radial, ulnar and brachial pulses bilaterally; 2+ right femoral, posterior tibial and dorsalis pedis pulses; 2+ left femoral, posterior tibial and dorsalis pedis pulses; no subclavian or femoral bruits  Neurological: grossly nonfocal   EKG: Sinus rhythm, chronic left bundle branch block   Lipid Panel     Component Value Date/Time   CHOL 144 07/28/2013 1224   TRIG 101 07/28/2013 1224   HDL 46 07/28/2013 1224   CHOLHDL 3.1 07/28/2013 1224   VLDL 20 07/28/2013 1224   LDLCALC 78 07/28/2013 1224    BMET    Component Value Date/Time   NA 139 07/28/2013 1224   K 4.1 07/28/2013 1224   CL 98 07/28/2013 1224   CO2 32 07/28/2013 1224   GLUCOSE 94 07/28/2013 1224   BUN 23 07/28/2013 1224   CREATININE 0.87 07/28/2013 1224   CREATININE 0.86 12/16/2012 0944   CALCIUM 9.5 07/28/2013 1224   GFRNONAA 69 07/28/2013 1224   GFRNONAA 68* 12/16/2012 0944   GFRAA 79 07/28/2013 1224   GFRAA 79* 12/16/2012 0944     ASSESSMENT AND PLAN Cardiomyopathy- EF 45-50% by echo 09/26/12 No signs or symptoms of heart failure exacerbation. No evidence of fluid retention. In like a functional class II (her functional class assessment is difficult since her psychiatric issues cause a lot of confounding symptoms). On appropriate treatment with an intense receptor blocker, beta blocker and on posture and antagonist.  Dyslipidemia Lipid profile on the current regimen is excellent  Severe obstructive sleep apnea Reports compliance with CPAP  Major depressive disorder, recurrent  episode, severe, without mention of psychotic behavior She reports numerous symptoms of severe depression, but does not endorse frankly suicidal ideation or behavior. Encouraged her to continue following up with her psychiatric professional.   Orders Placed This Encounter  Procedures  .  EKG 12-Lead   No orders of the defined types were placed in this encounter.    Denora Wysocki  Sanda Klein, MD, [facility] [phone] office [phone] pager

## 2013-11-08 NOTE — Assessment & Plan Note (Signed)
Lipid profile on the current regimen is excellent

## 2013-11-08 NOTE — Assessment & Plan Note (Signed)
She reports numerous symptoms of severe depression, but does not endorse frankly suicidal ideation or behavior. Encouraged her to continue following up with her psychiatric professional.

## 2013-11-08 NOTE — Assessment & Plan Note (Signed)
No signs or symptoms of heart failure exacerbation. No evidence of fluid retention. In like a functional class II (her functional class assessment is difficult since her psychiatric issues cause a lot of confounding symptoms). On appropriate treatment with an intense receptor blocker, beta blocker and on posture and antagonist.

## 2013-11-08 NOTE — Patient Instructions (Signed)
Dr. Croitoru recommends that you schedule a follow-up appointment in: ONE YEAR   

## 2013-11-08 NOTE — Assessment & Plan Note (Signed)
Reports compliance with CPAP.

## 2013-11-13 ENCOUNTER — Other Ambulatory Visit: Payer: Self-pay | Admitting: Emergency Medicine

## 2013-11-13 ENCOUNTER — Encounter: Payer: Self-pay | Admitting: Emergency Medicine

## 2013-11-13 ENCOUNTER — Ambulatory Visit (INDEPENDENT_AMBULATORY_CARE_PROVIDER_SITE_OTHER): Payer: Medicare Other | Admitting: Emergency Medicine

## 2013-11-13 VITALS — BP 124/68 | HR 70 | Temp 98.6°F | Resp 16 | Ht 63.5 in | Wt 169.0 lb

## 2013-11-13 DIAGNOSIS — E782 Mixed hyperlipidemia: Secondary | ICD-10-CM

## 2013-11-13 DIAGNOSIS — E039 Hypothyroidism, unspecified: Secondary | ICD-10-CM

## 2013-11-13 DIAGNOSIS — E538 Deficiency of other specified B group vitamins: Secondary | ICD-10-CM

## 2013-11-13 DIAGNOSIS — R7309 Other abnormal glucose: Secondary | ICD-10-CM

## 2013-11-13 DIAGNOSIS — H6120 Impacted cerumen, unspecified ear: Secondary | ICD-10-CM

## 2013-11-13 DIAGNOSIS — D649 Anemia, unspecified: Secondary | ICD-10-CM

## 2013-11-13 DIAGNOSIS — H8110 Benign paroxysmal vertigo, unspecified ear: Secondary | ICD-10-CM | POA: Diagnosis not present

## 2013-11-13 DIAGNOSIS — I10 Essential (primary) hypertension: Secondary | ICD-10-CM

## 2013-11-13 LAB — CBC WITH DIFFERENTIAL/PLATELET
BASOS ABS: 0.1 10*3/uL (ref 0.0–0.1)
Basophils Relative: 1 % (ref 0–1)
EOS ABS: 0.1 10*3/uL (ref 0.0–0.7)
EOS PCT: 1 % (ref 0–5)
HCT: 38.5 % (ref 36.0–46.0)
Hemoglobin: 12.9 g/dL (ref 12.0–15.0)
Lymphocytes Relative: 22 % (ref 12–46)
Lymphs Abs: 1.8 10*3/uL (ref 0.7–4.0)
MCH: 26.1 pg (ref 26.0–34.0)
MCHC: 33.5 g/dL (ref 30.0–36.0)
MCV: 77.9 fL — AB (ref 78.0–100.0)
Monocytes Absolute: 0.5 10*3/uL (ref 0.1–1.0)
Monocytes Relative: 6 % (ref 3–12)
NEUTROS PCT: 70 % (ref 43–77)
Neutro Abs: 5.9 10*3/uL (ref 1.7–7.7)
PLATELETS: 275 10*3/uL (ref 150–400)
RBC: 4.94 MIL/uL (ref 3.87–5.11)
RDW: 14.9 % (ref 11.5–15.5)
WBC: 8.4 10*3/uL (ref 4.0–10.5)

## 2013-11-13 LAB — HEMOGLOBIN A1C
HEMOGLOBIN A1C: 6.1 % — AB (ref <5.7)
Mean Plasma Glucose: 128 mg/dL — ABNORMAL HIGH (ref <117)

## 2013-11-13 MED ORDER — MECLIZINE HCL 25 MG PO TABS: 25.0000 mg | ORAL_TABLET | Freq: Three times a day (TID) | ORAL | Status: DC | PRN

## 2013-11-13 MED ORDER — PREDNISONE 10 MG PO TABS: ORAL_TABLET | ORAL | Status: DC

## 2013-11-13 NOTE — Patient Instructions (Signed)

## 2013-11-13 NOTE — Progress Notes (Signed)
Subjective:    Patient ID: Laura [name], female    DOB: 04-20-1946, 68 y.o.   MRN: [id number]  HPI Comments: 68 yo WF presents for 3 month F/U for HTN, Cholesterol, Pre-Dm, D. Deficient. She has been doing well with exercise and diet until recent balance issues. 1 month ago left ear was impacted and used oil to lossen and drain. She then noted mild discomfort for 1 week then resolved. Friday she stood up and was off balance and felt room spinning. She notes symptoms are still present but mild improvement unless turns head or moves to quick. She has noted mild nausea, indigestion and palpitations earlier today but have resolved. She denies previous vertigo episodes. She notes BP 108/60 last week at [facility] appointment and mentioned concerns but there were no medication changes made.   CHOL         144   07/28/2013 HDL           46   07/28/2013 LDLCALC       78   07/28/2013 TRIG         101   07/28/2013 CHOLHDL      3.1   07/28/2013 ALT           20   07/28/2013 AST           25   07/28/2013 ALKPHOS       52   07/28/2013 BILITOT      0.3   07/28/2013 CREATININE     0.87   07/28/2013 BUN              23   07/28/2013 NA              139   07/28/2013 K               4.1   07/28/2013 CL               98   07/28/2013 CO2              32   07/28/2013 WBC      6.8   07/28/2013 HGB     12.4   07/28/2013 HCT     38.5   07/28/2013 MCV     80.0   07/28/2013 PLT      283   07/28/2013 HGBA1C      6.1   07/28/2013 TSH    0.801   07/28/2013    Dizziness Associated symptoms include headaches.  Headache  Associated symptoms include dizziness.      Medication List       This list is accurate as of: 11/13/13 12:06 PM.  Always use your most recent med list.               acetaminophen 325 MG tablet  Commonly known as:  TYLENOL  Take 162.5 mg by mouth every 6 (six) hours as needed for pain (for headache.).     albuterol 108 (90 BASE) MCG/ACT inhaler  Commonly known as:  PROVENTIL HFA;VENTOLIN HFA  Inhale  2 puffs into the lungs as needed.     atorvastatin 80 MG tablet  Commonly known as:  LIPITOR  Take 40 mg by mouth every morning.     bumetanide 1 MG tablet  Commonly known as:  BUMEX  Take 1 tablet (1 mg total) by mouth every morning.     calcium carbonate 750 MG chewable tablet  Commonly known as:  TUMS EX  Chew 1 tablet by mouth daily.     diazepam 10 MG tablet  Commonly known as:  VALIUM  12 bid  1  prn     escitalopram 20 MG tablet  Commonly known as:  LEXAPRO  Take 1 tablet (20 mg total) by mouth daily.     estradiol 2 MG tablet  Commonly known as:  ESTRACE  TAKE ONE TABLET BY MOUTH DAILY     levothyroxine 75 MCG tablet  Commonly known as:  SYNTHROID, LEVOTHROID  TAKE 1 TABLET BY MOUTH DAILY     LINZESS 145 MCG Caps capsule  Generic drug:  Linaclotide  Take 145 mcg by mouth daily.     losartan-hydrochlorothiazide 100-25 MG per tablet  Commonly known as:  HYZAAR  Take 1 tablet by mouth daily.     metoprolol succinate 50 MG 24 hr tablet  Commonly known as:  TOPROL-XL  Take 1 tablet (50 mg total) by mouth every morning.     NUVIGIL 250 MG tablet  Generic drug:  Armodafinil  TAKE 1 TABLET BY MOUTH EVERY DAY IN THE MORNING     omeprazole 20 MG capsule  Commonly known as:  PRILOSEC  TAKE ONE CAPSULE BY MOUTH EVERY DAY     potassium chloride 10 MEQ tablet  Commonly known as:  K-DUR  Take 20 mEq by mouth daily.     spironolactone 25 MG tablet  Commonly known as:  ALDACTONE  TAKE 1 TABLET EVERY DAY     Vitamin D3 2000 UNITS capsule  Take 4,000 Units by mouth daily.       Allergies  Allergen Reactions  . Codeine Anaphylaxis, Hives and Other (See Comments)    Headache. Daughter reported that it caused her throat to swell up   . Ace Inhibitors Other (See Comments)    Unknown- it "didn't agree with her"   . Brintellix [Vortioxetine] Nausea Only  . Citalopram     Fatigue  . Fetzima [Levomilnacipran] Other (See Comments)    "Talking out of my head"  .  Lasix [Furosemide] Other (See Comments)    HEADACHE  . Nsaids Other (See Comments)    Told not to take NSAIDs because of her heart   . Xanax Starla Link Er] Other (See Comments)    confusion   Past Medical History  Diagnosis Date  . Hypothyroidism   . Anemia   . Seasonal allergies   . Depression   . SUI (stress urinary incontinence, female)   . OSA (obstructive sleep apnea) MODERATE PER STUDY 2005    CPAP NONCOMPLIANT  . History of kidney stones   . Nonischemic dilated cardiomyopathy     MODERATELY DEPRESSED LVF;EF 35-45% by Echo 05/27/11  . LBBB (left bundle branch block)   . Left ventricular ejection fraction less than 40%     38% PER CARDIOLOGIST NOTE (DR CROITORU)  . Short of breath on exertion   . Chronic combined systolic and diastolic CHF, NYHA class 2 CARDIOLOGIST-  DR [id number]  . Hypertension   . History of non-ST elevation myocardial infarction (NSTEMI) JUNE 2011    SECONDARY TO TAKOTSUDO SYNDROME (CARDIAC CATH NORMAL)  . Hyperlipemia   . Anxiety   . Asthma     related to sesonal allergies     Review of Systems  Neurological: Positive for dizziness and headaches.   BP 124/68  Pulse 70  Temp(Src) 98.6 F (37 C) (Temporal)  Resp 16  Ht 5' 3.5" (1.613 m)  Wt 169 lb (76.658  kg)  BMI 29.46 kg/m2     Objective:   Physical Exam  Nursing note and vitals reviewed. Constitutional: She is oriented to person, place, and time. She appears well-developed and well-nourished. No distress.  HENT:  Head: Normocephalic and atraumatic.  Right Ear: External ear normal.  Left Ear: External ear normal.  Nose: Nose normal.  Mouth/Throat: Oropharynx is clear and moist. No oropharyngeal exudate.  Bilateral Canals Occluded with soft brown wax. Lavage performed with complete wax resolution. Cloudy TM's bilaterally   Eyes: Conjunctivae and EOM are normal.  Neck: Normal range of motion. Neck supple. No JVD present. No thyromegaly present.  Cardiovascular: Normal rate,  regular rhythm, normal heart sounds and intact distal pulses.   Pulmonary/Chest: Effort normal and breath sounds normal.  Abdominal: Soft. Bowel sounds are normal. She exhibits no distension and no mass. There is no tenderness. There is no rebound and no guarding.  Musculoskeletal: Normal range of motion. She exhibits no edema and no tenderness.  Lymphadenopathy:    She has no cervical adenopathy.  Neurological: She is alert and oriented to person, place, and time. No cranial nerve deficit. Coordination normal.  NEG exam except mild off balance with position change  Skin: Skin is warm and dry. No rash noted. No erythema. No pallor.  Psychiatric: She has a normal mood and affect. Her behavior is normal. Judgment and thought content normal.          Assessment & Plan:  1.  3 month F/U for HTN, Cholesterol, Pre-Dm, D. Deficient. Needs healthy diet, cardio QD and obtain healthy weight. Check Labs, Check BP if >130/80 call office    2. ? Vertigo vs LOW BP episodes vs Fatigue- check labs, increase activity and H2O, Hygiene for vertigo explained. Decrease Hyzaar to 1/2 QD and call with results. w/c if SX increase or ER.   3. Cerumen impaction- Advised of hygiene/ maintenance after lavage with good results performed without any complaints.

## 2013-11-14 LAB — HEPATIC FUNCTION PANEL
ALK PHOS: 47 U/L (ref 39–117)
ALT: 17 U/L (ref 0–35)
AST: 18 U/L (ref 0–37)
Albumin: 4.1 g/dL (ref 3.5–5.2)
BILIRUBIN DIRECT: 0.1 mg/dL (ref 0.0–0.3)
BILIRUBIN INDIRECT: 0.2 mg/dL (ref 0.2–1.2)
TOTAL PROTEIN: 6.9 g/dL (ref 6.0–8.3)
Total Bilirubin: 0.3 mg/dL (ref 0.2–1.2)

## 2013-11-14 LAB — LIPID PANEL
CHOL/HDL RATIO: 3 ratio
Cholesterol: 146 mg/dL (ref 0–200)
HDL: 49 mg/dL (ref >39)
LDL CALC: 66 mg/dL (ref 0–99)
Triglycerides: 156 mg/dL — ABNORMAL HIGH (ref <150)
VLDL: 31 mg/dL (ref 0–40)

## 2013-11-14 LAB — IRON AND TIBC
%SAT: 16 % — AB (ref 20–55)
Iron: 50 ug/dL (ref 42–145)
TIBC: 315 ug/dL (ref 250–470)
UIBC: 265 ug/dL (ref 125–400)

## 2013-11-14 LAB — BASIC METABOLIC PANEL WITH GFR
BUN: 14 mg/dL (ref 6–23)
CO2: 31 mEq/L (ref 19–32)
CREATININE: 0.77 mg/dL (ref 0.50–1.10)
Calcium: 9.5 mg/dL (ref 8.4–10.5)
Chloride: 102 mEq/L (ref 96–112)
GFR, Est Non African American: 80 mL/min
GLUCOSE: 105 mg/dL — AB (ref 70–99)
POTASSIUM: 3.7 meq/L (ref 3.5–5.3)
Sodium: 142 mEq/L (ref 135–145)

## 2013-11-14 LAB — INSULIN, FASTING: INSULIN FASTING, SERUM: 19 u[IU]/mL (ref 3–28)

## 2013-11-14 LAB — TSH: TSH: 0.528 u[IU]/mL (ref 0.350–4.500)

## 2013-11-15 LAB — VITAMIN B12: VITAMIN B 12: 628 pg/mL (ref 211–911)

## 2013-11-29 ENCOUNTER — Other Ambulatory Visit: Payer: Self-pay | Admitting: Internal Medicine

## 2013-11-29 DIAGNOSIS — K625 Hemorrhage of anus and rectum: Secondary | ICD-10-CM | POA: Diagnosis not present

## 2013-11-29 DIAGNOSIS — K602 Anal fissure, unspecified: Secondary | ICD-10-CM | POA: Diagnosis not present

## 2013-11-29 DIAGNOSIS — K6289 Other specified diseases of anus and rectum: Secondary | ICD-10-CM | POA: Diagnosis not present

## 2013-11-29 DIAGNOSIS — L290 Pruritus ani: Secondary | ICD-10-CM | POA: Diagnosis not present

## 2013-12-18 DIAGNOSIS — K5900 Constipation, unspecified: Secondary | ICD-10-CM | POA: Diagnosis not present

## 2013-12-20 DIAGNOSIS — H43819 Vitreous degeneration, unspecified eye: Secondary | ICD-10-CM | POA: Diagnosis not present

## 2013-12-20 DIAGNOSIS — H26499 Other secondary cataract, unspecified eye: Secondary | ICD-10-CM | POA: Diagnosis not present

## 2013-12-20 DIAGNOSIS — H35379 Puckering of macula, unspecified eye: Secondary | ICD-10-CM | POA: Diagnosis not present

## 2013-12-27 ENCOUNTER — Ambulatory Visit (INDEPENDENT_AMBULATORY_CARE_PROVIDER_SITE_OTHER): Payer: Medicare Other | Admitting: Psychiatry

## 2013-12-27 DIAGNOSIS — F332 Major depressive disorder, recurrent severe without psychotic features: Secondary | ICD-10-CM

## 2013-12-27 MED ORDER — ESCITALOPRAM OXALATE 20 MG PO TABS
20.0000 mg | ORAL_TABLET | Freq: Every day | ORAL | Status: DC
Start: 2013-12-27 — End: 2014-04-04

## 2013-12-27 MED ORDER — DIAZEPAM 10 MG PO TABS: ORAL_TABLET | ORAL | Status: DC
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
## 2014-01-01 ENCOUNTER — Telehealth: Payer: Self-pay | Admitting: Cardiovascular Disease

## 2014-01-01 NOTE — Telephone Encounter (Signed)
[facility] says she needs new machine.  She says they cannot get her machine straight, Please call

## 2014-01-01 NOTE — Telephone Encounter (Signed)
LM will send info to [facility] to get Dr. Claiborne Billings to send a new order.

## 2014-01-17 ENCOUNTER — Ambulatory Visit (INDEPENDENT_AMBULATORY_CARE_PROVIDER_SITE_OTHER): Payer: Medicare Other | Admitting: Internal Medicine

## 2014-01-17 ENCOUNTER — Encounter: Payer: Self-pay | Admitting: Internal Medicine

## 2014-01-17 VITALS — BP 126/60 | HR 112 | Temp 99.3°F | Resp 16 | Ht 63.5 in | Wt 173.0 lb

## 2014-01-17 DIAGNOSIS — J028 Acute pharyngitis due to other specified organisms: Secondary | ICD-10-CM

## 2014-01-17 DIAGNOSIS — J0410 Acute tracheitis without obstruction: Secondary | ICD-10-CM | POA: Diagnosis not present

## 2014-01-17 DIAGNOSIS — J029 Acute pharyngitis, unspecified: Secondary | ICD-10-CM | POA: Diagnosis not present

## 2014-01-17 MED ORDER — ARMODAFINIL 250 MG PO TABS: ORAL_TABLET | ORAL | Status: DC

## 2014-01-17 MED ORDER — PREDNISONE 20 MG PO TABS: ORAL_TABLET | ORAL | Status: DC

## 2014-01-17 MED ORDER — HYDROCODONE-ACETAMINOPHEN 5-325 MG PO TABS: ORAL_TABLET | ORAL | Status: DC

## 2014-01-17 MED ORDER — AZITHROMYCIN 250 MG PO TABS: ORAL_TABLET | ORAL | Status: DC

## 2014-01-17 NOTE — Progress Notes (Signed)
Subjective:    Patient ID: Laura [name], female    DOB: Aug 23, 1945, 68 y.o.   MRN: [id number]  Sore Throat  This is a new problem. The current episode started yesterday. The problem has been gradually worsening. Neither side of throat is experiencing more pain than the other. The maximum temperature recorded prior to her arrival was 100 - 100.9 F. The fever has been present for less than 1 day. The pain is mild. Associated symptoms include coughing, ear pain and headaches. Pertinent negatives include no abdominal pain, congestion, diarrhea, ear discharge, hoarse voice, plugged ear sensation, neck pain, shortness of breath, trouble swallowing or vomiting. She has tried nothing for the symptoms.  Cough The current episode started yesterday. The problem has been gradually worsening. The problem occurs every few minutes. The cough is non-productive. Associated symptoms include ear pain, a fever, headaches, myalgias, nasal congestion and a sore throat. Pertinent negatives include no chest pain, chills, heartburn, hemoptysis, postnasal drip, rash, rhinorrhea, shortness of breath, sweats or wheezing. Nothing aggravates the symptoms. She has tried nothing for the symptoms. There is no history of bronchitis.  Otalgia  There is pain in the left (c/o Lt ear pain w/cough) ear. This is a new problem. The current episode started yesterday. The problem occurs every few minutes. The problem has been gradually worsening. The maximum temperature recorded prior to her arrival was 100 - 100.9 F. The fever has been present for less than 1 day. Associated symptoms include coughing, headaches and a sore throat. Pertinent negatives include no abdominal pain, diarrhea, ear discharge, neck pain, rash, rhinorrhea or vomiting. The treatment provided mild relief. There is no history of a chronic ear infection, hearing loss or a tympanostomy tube.   Medication Sig  . acetaminophen (TYLENOL) 325 MG tablet Take 162.5 mg by mouth  every 6 (six) hours as needed for pain (for headache.).  Marland Kitchen albuterol /VENTOLIN HFA Inhale 2 puffs into the lungs as needed.   Marland Kitchen atorvastatin80 MG tablet TAKE 1 TABLET BY MOUTH DAILY FOR CHOLESTEROL  . bumetanide 1 MG tablet Take 1 tablet (1 mg total) by mouth every morning.  . calcium carbonate (TUMS EX) 750  Chew 1 tablet by mouth daily.  Marland Kitchen VITAMIN D 2000 UNITS cap Take 4,000 Units by mouth daily.   Marland Kitchen escitalopram ) 20 MG tab Take 1 tablet (20 mg total) by mouth daily.  Marland Kitchen estradiol  2 MG tablet TAKE ONE TABLET BY MOUTH DAILY  . levothyroxine  75 MCG tablet TAKE 1 TABLET BY MOUTH DAILY  . Linaclotide (LINZESS) 145 MCG  Take 145 mcg by mouth daily.  Marland Kitchen losartan-hctz  100-25 MG Take 1 tablet by mouth daily.   . meclizine  25 MG tablet Take 1 tablet  3 ( times daily as needed  . metoprolol succinate XL 50 MG Take 1 tablet every morning.  Marland Kitchen omeprazole 20 MG capsule TAKE ONE CAPSULE BY MOUTH EVERY DAY  . potassium chl (K-DUR) 10 MEQ  Take 20 mEq by mouth aily.   Marland Kitchen spironolactone  25 MG tab TAKE 1 TABLET EVERY DAY   Allergies  Allergen Reactions  . Codeine Anaphylaxis, Hives and Other (See Comments)    Headache. Daughter reported that it caused her throat to swell up   . Ace Inhibitors Other (See Comments)    Unknown- it "didn't agree with her"   . Brintellix [Vortioxetine] Nausea Only  . Citalopram     Fatigue  . Fetzima [Levomilnacipran] Other (See Comments)    "  Talking out of my head"  . Lasix [Furosemide] Other (See Comments)    HEADACHE  . Nsaids Other (See Comments)    Told not to take NSAIDs because of her heart   . Xanax Starla Link Er] Other (See Comments)    confusion   Past Medical History  Diagnosis Date  . Hypothyroidism   . Anemia   . Seasonal allergies   . Depression   . SUI (stress urinary incontinence, female)   . OSA (obstructive sleep apnea) MODERATE PER STUDY 2005    CPAP NONCOMPLIANT  . History of kidney stones   . Nonischemic dilated cardiomyopathy      MODERATELY DEPRESSED LVF;EF 35-45% by Echo 05/27/11  . LBBB (left bundle branch block)   . Left ventricular ejection fraction less than 40%     38% PER CARDIOLOGIST NOTE (DR CROITORU)  . Short of breath on exertion   . Chronic combined systolic and diastolic CHF, NYHA class 2 CARDIOLOGIST-  DR [id number]  . Hypertension   . History of non-ST elevation myocardial infarction (NSTEMI) JUNE 2011    SECONDARY TO TAKOTSUDO SYNDROME (CARDIAC CATH NORMAL)  . Hyperlipemia   . Anxiety   . Asthma     related to sesonal allergies   Review of Systems  Constitutional: Positive for fever. Negative for chills.  HENT: Positive for ear pain and sore throat. Negative for congestion, ear discharge, hoarse voice, postnasal drip, rhinorrhea and trouble swallowing.   Respiratory: Positive for cough. Negative for hemoptysis, shortness of breath and wheezing.   Cardiovascular: Negative for chest pain.  Gastrointestinal: Negative for heartburn, vomiting, abdominal pain and diarrhea.  Musculoskeletal: Positive for myalgias. Negative for neck pain.  Skin: Negative for rash.  Neurological: Positive for headaches.   BP 126/60  Pulse 112  Temp(Src) 99.3 F (37.4 C) (Temporal)  Resp 16  Ht 5' 3.5" (1.613 m)  Wt 173 lb (78.472 kg)  BMI 30.16 kg/m2 Objective:   Physical Exam  Constitutional: She is oriented to person, place, and time. No distress.  HENT:  Right Ear: External ear normal.  Left Ear: External ear normal.  Nose: Nose normal.  Mouth/Throat: No oropharyngeal exudate.  Posterior pharynx 2+ injected .  Eyes: EOM are normal. Pupils are equal, round, and reactive to light. Right eye exhibits no discharge. Left eye exhibits no discharge.  Neck: Normal range of motion. Neck supple. No JVD present. No thyromegaly present.  Cardiovascular: Normal rate, regular rhythm and normal heart sounds.   No murmur heard. Pulmonary/Chest: Effort normal. No respiratory distress. She has no wheezes. She has no rales.   Abdominal: Soft.  Musculoskeletal: Normal range of motion.  Lymphadenopathy:    She has no cervical adenopathy.  Neurological: She is alert and oriented to person, place, and time. No cranial nerve deficit. Coordination normal.  Skin: Skin is warm and dry. No rash noted. No erythema. No pallor.   Assessment & Plan:   1. Acute tracheitis without mention of obstruction  2. Acute pharyngitis due to other specified organisms  - Rx Z Pak, Prednisone Taper, Norco 5 prn - ROV prn

## 2014-01-23 ENCOUNTER — Other Ambulatory Visit: Payer: Self-pay | Admitting: Emergency Medicine

## 2014-01-23 ENCOUNTER — Other Ambulatory Visit: Payer: Self-pay | Admitting: *Deleted

## 2014-01-23 ENCOUNTER — Telehealth: Payer: Self-pay | Admitting: *Deleted

## 2014-01-23 ENCOUNTER — Telehealth: Payer: Self-pay | Admitting: Cardiovascular Disease

## 2014-01-23 MED ORDER — ARMODAFINIL 250 MG PO TABS: ORAL_TABLET | ORAL | Status: DC

## 2014-01-23 NOTE — Telephone Encounter (Signed)
Patient needs samples of Nuvigil 250 mg

## 2014-01-23 NOTE — Telephone Encounter (Signed)
Patient called and states she lost her written RX for Nuvigil.  OK to call RX in for patient per Dr Melford Aase.

## 2014-01-23 NOTE — Telephone Encounter (Signed)
In formed patient no samples of nuvigil she states she cannot function with out. Patient states  Can not afford the mediaciton it cost $500 and more. She state her psychiatrist started medication ,but she is not seeing her any more. Dr Melford Aase referred her to another psychiatrist at [place] . Patient states He will not fill it. She states Dr Melford Aase fills the [place]. RN asked if she has tried for assistance patient states yes and she was not eligible. RN informed to contact Dr Melford Aase, also will let Dr Claiborne Billings KNOW  Patient also has C-PAP- and information is being download in a few days concerning her pressure settings

## 2014-01-29 NOTE — Telephone Encounter (Signed)
If she cannot afford nuvigil; consider provigil 200 mg daily for initial dosing.

## 2014-01-30 NOTE — Telephone Encounter (Signed)
Still taking Nuvigil.  Hasn't had time to check with Dr. Melford Aase plus he is currently treating her for pneumonia.  She has an appt scheduled w/Dr. Melford Aase this month and will discuss an alternative to the [facility].  States provigil is not on her insurance formulary.

## 2014-02-02 ENCOUNTER — Ambulatory Visit: Payer: Self-pay | Admitting: Internal Medicine

## 2014-02-09 ENCOUNTER — Encounter: Payer: Self-pay | Admitting: Internal Medicine

## 2014-02-09 ENCOUNTER — Ambulatory Visit (INDEPENDENT_AMBULATORY_CARE_PROVIDER_SITE_OTHER): Payer: Medicare Other | Admitting: Internal Medicine

## 2014-02-09 VITALS — BP 138/80 | HR 60 | Temp 98.2°F | Resp 16 | Ht 63.5 in | Wt 171.2 lb

## 2014-02-09 DIAGNOSIS — E559 Vitamin D deficiency, unspecified: Secondary | ICD-10-CM | POA: Diagnosis not present

## 2014-02-09 DIAGNOSIS — R7303 Prediabetes: Secondary | ICD-10-CM

## 2014-02-09 DIAGNOSIS — I10 Essential (primary) hypertension: Secondary | ICD-10-CM | POA: Diagnosis not present

## 2014-02-09 DIAGNOSIS — R7309 Other abnormal glucose: Secondary | ICD-10-CM | POA: Diagnosis not present

## 2014-02-09 DIAGNOSIS — Z79899 Other long term (current) drug therapy: Secondary | ICD-10-CM

## 2014-02-09 DIAGNOSIS — E785 Hyperlipidemia, unspecified: Secondary | ICD-10-CM

## 2014-02-09 LAB — CBC WITH DIFFERENTIAL/PLATELET
BASOS ABS: 0.1 10*3/uL (ref 0.0–0.1)
Basophils Relative: 1 % (ref 0–1)
EOS ABS: 0.3 10*3/uL (ref 0.0–0.7)
Eosinophils Relative: 5 % (ref 0–5)
HEMATOCRIT: 36.5 % (ref 36.0–46.0)
HEMOGLOBIN: 12.3 g/dL (ref 12.0–15.0)
Lymphocytes Relative: 36 % (ref 12–46)
Lymphs Abs: 1.9 10*3/uL (ref 0.7–4.0)
MCH: 26.5 pg (ref 26.0–34.0)
MCHC: 33.7 g/dL (ref 30.0–36.0)
MCV: 78.7 fL (ref 78.0–100.0)
MONO ABS: 0.4 10*3/uL (ref 0.1–1.0)
MONOS PCT: 8 % (ref 3–12)
NEUTROS ABS: 2.7 10*3/uL (ref 1.7–7.7)
NEUTROS PCT: 50 % (ref 43–77)
Platelets: 247 10*3/uL (ref 150–400)
RBC: 4.64 MIL/uL (ref 3.87–5.11)
RDW: 14.6 % (ref 11.5–15.5)
WBC: 5.4 10*3/uL (ref 4.0–10.5)

## 2014-02-09 LAB — HEMOGLOBIN A1C
HEMOGLOBIN A1C: 6.3 % — AB (ref <5.7)
MEAN PLASMA GLUCOSE: 134 mg/dL — AB (ref <117)

## 2014-02-09 NOTE — Progress Notes (Signed)
Patient ID: Laura [name], female   DOB: [birth year], 68 y.o.   MRN: [id number]   This very nice 68 y.o.MWF presents for 3 month follow up with Hypertension, Hyperlipidemia, Pre-Diabetes and Vitamin D Deficiency.    Patient is treated for HTN & BP has been controlled at home. Today's BP: 138/80 mmHg. Patient has hx/o NSTEMI with a normal Heart Cath.Patient denies any cardiac type chest pain, palpitations, dyspnea/orthopnea/PND, dizziness, claudication, or dependent edema.   Hyperlipidemia is controlled with diet & meds. Patient denies myalgias or other med SE's. Last Lipids were at goal with Cholesterol 146; HDL  49; LDL  66; Triglycerides 156 on 11/13/2013.   Also, the patient has history of PreDiabetes and patient denies any symptoms of reactive hypoglycemia, diabetic polys, paresthesias or visual blurring.  Last A1c was 6.1% on 11/13/2013.    Further, Patient has history of Vitamin D Deficiency and patient supplements vitamin D without any suspected side-effects. Last vitamin D was 52 on 07/28/2013.   Medication List   acetaminophen 325 MG tablet  Commonly known as:  TYLENOL  Take 162.5 mg by mouth every 6 (six) hours as needed for pain (for headache.).     albuterol 108 (90 BASE) MCG/ACT inhaler  Commonly known as:  PROVENTIL HFA;VENTOLIN HFA  Inhale 2 puffs into the lungs as needed.     atorvastatin 80 MG tablet  Commonly known as:  LIPITOR  TAKE 1 TABLET BY MOUTH DAILY FOR CHOLESTEROL     bumetanide 1 MG tablet  Commonly known as:  BUMEX  Take 1 tablet (1 mg total) by mouth every morning.     calcium carbonate 750 MG chewable tablet  Commonly known as:  TUMS EX  Chew 1 tablet by mouth daily.     diazepam 10 MG tablet  Commonly known as:  VALIUM  takes 1 1/2 tab in afternoon     escitalopram 20 MG tablet  Commonly known as:  LEXAPRO  Take 1 tablet (20 mg total) by mouth daily.     estradiol 2 MG tablet  Commonly known as:  ESTRACE  TAKE ONE TABLET BY MOUTH DAILY      KLOR-CON M10 10 MEQ tablet  Generic drug:  potassium chloride     levothyroxine 75 MCG tablet  Commonly known as:  SYNTHROID, LEVOTHROID  TAKE 1 TABLET BY MOUTH DAILY     losartan-hydrochlorothiazide 100-25 MG per tablet  Commonly known as:  HYZAAR  Take 1 tablet by mouth daily.     meclizine 25 MG tablet  Commonly known as:  ANTIVERT  Take 1 tablet (25 mg total) by mouth 3 (three) times daily as needed for dizziness or nausea.     metoprolol succinate 50 MG 24 hr tablet  Commonly known as:  TOPROL-XL  Take 1 tablet (50 mg total) by mouth every morning.     NUVIGIL 250 MG tablet  Generic drug:  Armodafinil  TAKE 1 TABLET BY MOUTH EVERY DAY     omeprazole 20 MG capsule  Commonly known as:  PRILOSEC  TAKE ONE CAPSULE BY MOUTH EVERY DAY     potassium chloride 10 MEQ tablet  Commonly known as:  K-DUR  Take 20 mEq by mouth daily.     spironolactone 25 MG tablet  Commonly known as:  ALDACTONE  TAKE 1 TABLET EVERY DAY     Vitamin D3 2000 UNITS capsule  Take 4,000 Units by mouth daily.     Allergies  Allergen Reactions  . Codeine Anaphylaxis,  Hives and Other (See Comments)    Headache. Daughter reported that it caused her throat to swell up   . Ace Inhibitors Other (See Comments)    Unknown- it "didn't agree with her"   . Brintellix [Vortioxetine] Nausea Only  . Citalopram     Fatigue  . Fetzima [Levomilnacipran] Other (See Comments)    "Talking out of my head"  . Lasix [Furosemide] Other (See Comments)    HEADACHE  . Nsaids Other (See Comments)    Told not to take NSAIDs because of her heart   . Xanax Starla Link Er] Other (See Comments)    confusion   PMHx:   Past Medical History  Diagnosis Date  . Hypothyroidism   . Anemia   . Seasonal allergies   . Depression   . SUI (stress urinary incontinence, female)   . OSA (obstructive sleep apnea) MODERATE PER STUDY 2005    CPAP NONCOMPLIANT  . History of kidney stones   . Nonischemic dilated cardiomyopathy      MODERATELY DEPRESSED LVF;EF 35-45% by Echo 05/27/11  . LBBB (left bundle branch block)   . Left ventricular ejection fraction less than 40%     38% PER CARDIOLOGIST NOTE (DR CROITORU)  . Short of breath on exertion   . Chronic combined systolic and diastolic CHF, NYHA class 2 CARDIOLOGIST-  DR NWGNFAOZ  . Hypertension   . History of non-ST elevation myocardial infarction (NSTEMI) JUNE 2011    SECONDARY TO TAKOTSUDO SYNDROME (CARDIAC CATH NORMAL)  . Hyperlipemia   . Anxiety   . Asthma     related to sesonal allergies   FHx:    Reviewed / unchanged SHx:    Reviewed / unchanged  Systems Review:  Constitutional: Denies fever, chills, wt changes, headaches, insomnia, fatigue, night sweats, change in appetite. Eyes: Denies redness, blurred vision, diplopia, discharge, itchy, watery eyes.  ENT: Denies discharge, congestion, post nasal drip, epistaxis, sore throat, earache, hearing loss, dental pain, tinnitus, vertigo, sinus pain, snoring.  CV: Denies chest pain, palpitations, irregular heartbeat, syncope, dyspnea, diaphoresis, orthopnea, PND, claudication or edema. Respiratory: denies cough, dyspnea, DOE, pleurisy, hoarseness, laryngitis, wheezing.  Gastrointestinal: Denies dysphagia, odynophagia, heartburn, reflux, water brash, abdominal pain or cramps, nausea, vomiting, bloating, diarrhea, constipation, hematemesis, melena, hematochezia  or hemorrhoids. Genitourinary: Denies dysuria, frequency, urgency, nocturia, hesitancy, discharge, hematuria or flank pain. Musculoskeletal: Denies arthralgias, myalgias, stiffness, jt. swelling, pain, limping or strain/sprain.  Skin: Denies pruritus, rash, hives, warts, acne, eczema or change in skin lesion(s). Neuro: No weakness, tremor, incoordination, spasms, paresthesia or pain. Psychiatric: Denies confusion, memory loss or sensory loss. Endo: Denies change in weight, skin or hair change.  Heme/Lymph: No excessive bleeding, bruising or enlarged  lymph nodes.  Exam:  BP 138/80  Pulse 60  Temp(Src) 98.2 F (36.8 C) (Temporal)  Resp 16  Ht 5' 3.5" (1.613 m)  Wt 171 lb 3.2 oz (77.656 kg)  BMI 29.85 kg/m2  Appears well nourished and in no distress. Eyes: PERRLA, EOMs, conjunctiva no swelling or erythema. Sinuses: No frontal/maxillary tenderness ENT/Mouth: EAC's clear, TM's nl w/o erythema, bulging. Nares clear w/o erythema, swelling, exudates. Oropharynx clear without erythema or exudates. Oral hygiene is good. Tongue normal, non obstructing. Hearing intact.  Neck: Supple. Thyroid nl. Car 2+/2+ without bruits, nodes or JVD. Chest: Respirations nl with BS clear & equal w/o rales, rhonchi, wheezing or stridor.  Cor: Heart sounds normal w/ regular rate and rhythm without sig. murmurs, gallops, clicks, or rubs. Peripheral pulses normal and equal  without edema.  Abdomen: Soft & bowel sounds normal. Non-tender w/o guarding, rebound, hernias, masses, or organomegaly.  Lymphatics: Unremarkable.  Musculoskeletal: Full ROM all peripheral extremities, joint stability, 5/5 strength, and normal gait.  Skin: Warm, dry without exposed rashes, lesions or ecchymosis apparent.  Neuro: Cranial nerves intact, reflexes equal bilaterally. Sensory-motor testing grossly intact. Tendon reflexes grossly intact.  Pysch: Alert & oriented x 3. Insight and judgement nl & appropriate. No ideations.  Assessment and Plan:  1. Hypertension - Continue monitor blood pressure at home. Continue diet/meds same.  2. Hyperlipidemia - Continue diet/meds, exercise,& lifestyle modifications. Continue monitor periodic cholesterol/liver & renal functions   3. Pre-Diabetes - Continue diet, exercise, lifestyle modifications. Monitor appropriate labs.-    4. Vitamin D Deficiency - Continue supplementation.  Recommended regular exercise, BP monitoring, weight control, and discussed med and SE's. Recommended labs to assess and monitor clinical status. Further disposition  pending results of labs.

## 2014-02-09 NOTE — Patient Instructions (Signed)

## 2014-02-10 LAB — HEPATIC FUNCTION PANEL
ALBUMIN: 4.1 g/dL (ref 3.5–5.2)
ALT: 19 U/L (ref 0–35)
AST: 20 U/L (ref 0–37)
Alkaline Phosphatase: 48 U/L (ref 39–117)
Bilirubin, Direct: 0.1 mg/dL (ref 0.0–0.3)
Indirect Bilirubin: 0.4 mg/dL (ref 0.2–1.2)
TOTAL PROTEIN: 6.7 g/dL (ref 6.0–8.3)
Total Bilirubin: 0.5 mg/dL (ref 0.2–1.2)

## 2014-02-10 LAB — LIPID PANEL
CHOLESTEROL: 139 mg/dL (ref 0–200)
HDL: 52 mg/dL (ref >39)
LDL Cholesterol: 70 mg/dL (ref 0–99)
TRIGLYCERIDES: 85 mg/dL (ref <150)
Total CHOL/HDL Ratio: 2.7 Ratio
VLDL: 17 mg/dL (ref 0–40)

## 2014-02-10 LAB — TSH: TSH: 1.455 u[IU]/mL (ref 0.350–4.500)

## 2014-02-10 LAB — MAGNESIUM: MAGNESIUM: 1.9 mg/dL (ref 1.5–2.5)

## 2014-02-10 LAB — BASIC METABOLIC PANEL WITH GFR
BUN: 21 mg/dL (ref 6–23)
CALCIUM: 9.5 mg/dL (ref 8.4–10.5)
CO2: 31 mEq/L (ref 19–32)
CREATININE: 0.79 mg/dL (ref 0.50–1.10)
Chloride: 97 mEq/L (ref 96–112)
GFR, EST AFRICAN AMERICAN: 89 mL/min
GFR, EST NON AFRICAN AMERICAN: 77 mL/min
GLUCOSE: 87 mg/dL (ref 70–99)
Potassium: 3.5 mEq/L (ref 3.5–5.3)
Sodium: 136 mEq/L (ref 135–145)

## 2014-02-10 LAB — INSULIN, FASTING: Insulin fasting, serum: 6 u[IU]/mL (ref 3–28)

## 2014-02-10 LAB — VITAMIN D 25 HYDROXY (VIT D DEFICIENCY, FRACTURES): Vit D, 25-Hydroxy: 76 ng/mL (ref 30–89)

## 2014-02-16 ENCOUNTER — Telehealth: Payer: Self-pay | Admitting: Cardiovascular Disease

## 2014-02-16 NOTE — Telephone Encounter (Signed)
Please tell her that I have no experience with this substance, but I looked it up and I see no obvious problem for her with this

## 2014-02-16 NOTE — Telephone Encounter (Signed)
Spoke with pt, aware of dr croitoru's recommendations.  

## 2014-02-16 NOTE — Telephone Encounter (Signed)
Wants to know if she can take take a dietary supplement (Serrapeptase)?

## 2014-02-16 NOTE — Telephone Encounter (Signed)
Will forward for dr c review

## 2014-02-23 ENCOUNTER — Other Ambulatory Visit: Payer: Self-pay | Admitting: Emergency Medicine

## 2014-03-12 ENCOUNTER — Other Ambulatory Visit: Payer: Self-pay

## 2014-03-12 MED ORDER — LEVOTHYROXINE SODIUM 75 MCG PO TABS: 75.0000 ug | ORAL_TABLET | Freq: Every day | ORAL | Status: DC

## 2014-03-12 NOTE — Telephone Encounter (Signed)
Patient request refill on Levothyroxine , transferring to [facility]

## 2014-03-13 ENCOUNTER — Telehealth: Payer: Self-pay | Admitting: Cardiovascular Disease

## 2014-03-13 MED ORDER — BUMETANIDE 1 MG PO TABS: 1.0000 mg | ORAL_TABLET | ORAL | Status: DC

## 2014-03-13 MED ORDER — LOSARTAN POTASSIUM-HCTZ 100-25 MG PO TABS
1.0000 | ORAL_TABLET | Freq: Every day | ORAL | Status: DC
Start: 2014-03-13 — End: 2015-02-08

## 2014-03-13 NOTE — Telephone Encounter (Signed)
Pt need a new prescription for her Bumex and Losartan please. She did not know the mg of the medicine.

## 2014-03-13 NOTE — Telephone Encounter (Signed)
Rx refill sent to patient pharmacy   

## 2014-03-21 ENCOUNTER — Telehealth: Payer: Self-pay | Admitting: Cardiovascular Disease

## 2014-03-21 NOTE — Telephone Encounter (Signed)
Returned call to patient. She states that [place] with [facility] has not gotten an answer regarding a different mask for patient. She states she tried the Nurse, adult and she is tired and sleepy all the times, and has not slept in 4 nights. She states Jonni Sanger took a disc out of the machine and sent the report to Dr. Claiborne Billings months ago and she has not heard anything. Informed patient that last download in our computer system is from 12/2012.   Patient has some questions that she would like [place]/Dr. Claiborne Billings to talk with her about.

## 2014-03-21 NOTE — Telephone Encounter (Signed)
Please call,concerning her C-Pap.Pt gave very little details,said Dr Claiborne Billings had took over her C-Pap machine.

## 2014-03-25 NOTE — Telephone Encounter (Signed)
Have Laura [name] f/u when she returns from vacation

## 2014-03-27 ENCOUNTER — Other Ambulatory Visit: Payer: Self-pay | Admitting: *Deleted

## 2014-03-27 MED ORDER — BUMETANIDE 1 MG PO TABS: 1.0000 mg | ORAL_TABLET | ORAL | Status: DC

## 2014-03-28 DIAGNOSIS — H538 Other visual disturbances: Secondary | ICD-10-CM | POA: Diagnosis not present

## 2014-04-02 DIAGNOSIS — H26493 Other secondary cataract, bilateral: Secondary | ICD-10-CM | POA: Diagnosis not present

## 2014-04-02 DIAGNOSIS — H26492 Other secondary cataract, left eye: Secondary | ICD-10-CM | POA: Diagnosis not present

## 2014-04-04 ENCOUNTER — Ambulatory Visit (INDEPENDENT_AMBULATORY_CARE_PROVIDER_SITE_OTHER): Payer: Medicare Other | Admitting: Psychiatry

## 2014-04-04 DIAGNOSIS — F332 Major depressive disorder, recurrent severe without psychotic features: Secondary | ICD-10-CM

## 2014-04-04 DIAGNOSIS — F330 Major depressive disorder, recurrent, mild: Secondary | ICD-10-CM

## 2014-04-04 MED ORDER — DIAZEPAM 10 MG PO TABS: ORAL_TABLET | ORAL | Status: DC

## 2014-04-04 MED ORDER — ESCITALOPRAM OXALATE 20 MG PO TABS: 20.0000 mg | ORAL_TABLET | Freq: Every day | ORAL | Status: DC

## 2014-04-04 NOTE — Progress Notes (Signed)
[facility] MD Progress Note  04/04/2014 1:39 PM Laura [name]  MRN:  [id number] Subjective: The same This patient has had a number of medical problems since I've seen her. She's had her left eye operated on, rather had laser treatment. The patient had pneumonia. The patient is usually is very hyperverbal. She does extensive stories and information that is somewhat excessive. Reassure symptoms she has a lot to tell me. For example she is sleeping fairly well but she is on CPAP. She's very unsatisfied with the people who are adjusting the CPAP pressure. The patient does have an appetite despite the fact that she has a lot of abdominal complaints which she was told to have her long time. The patient is concentrating fairly well. She denies feeling worthless. The patient is unable to describe what she enjoys. It should be noted that her daughter and her granddaughter who 60 lives with them. She could describe it she enjoyed caring for her granddaughter for years but now she is moving emotionally away from her. The patient is missing being closer with her granddaughter. Her granddaughter is quite advanced and is actually going to college. It is noted patient does have a past psychiatric history. She was psychiatrically hospitalized for suicidal ideation. She was on multiple different antidepressants. 2 years ago she began feeling I think somewhat worse crying a great deal of very distressed. Her daughter called her psychiatrist and she was begun then on Lexapro. I do believe the patient got better. It's hard to get this patient to her knowledge that there is differences improvements over the years. The patient didn't time it was that a time when she was psychiatrically hospitalized for suicidal ideation at that time her psychiatrist took her off Paxil. She expressed a great deal of anger at her antidepressant was removed. Today we talked about her Valium being 10 mg one in the morning and noted to be 1-1/2 at night. She  corrected me and said that she sometimes doesn't take it unless she needs it. I shared with her that she should take 1 a day every morning and an excellent if she needs it. The patient sure that attempts to reduce the dose led to excessive anxiety. The patient says the best medication she's on is Nuvigil given by her primary care doctor. She claims that without Nuvigil should be completely energy less in bed ridden. In fact her energy on Nuvigil is pretty good. And the patient is active although she can't exactly describe what she does with her time. The patient is having issues with purpose and passion. I don't think 20 mg of Valium is helpful. The patient is very resistant to reduce it. I am very resistant to reduce her Lexapro. At this time the patient is not suicidal. Diagnosis:   DSM5: Schizophrenia Disorders:   Obsessive-Compulsive Disorders:   Trauma-Stressor Disorders:   Substance/Addictive Disorders:   Depressive Disorders:  Major Depressive Disorder - Mild (296.21) Total Time spent with patient: 30 minutes  Axis I: Major Depression, Recurrent severe  ADL's:  Intact  Sleep: Fair  Appetite:  Good  Suicidal Ideation:  no Homicidal Ideation:  none AEB (as evidenced by):  Psychiatric Specialty Exam: Physical Exam  ROS  There were no vitals taken for this visit.There is no weight on file to calculate BMI.  General Appearance: Casual  Eye Contact::  Good  Speech:  Clear and Coherent  Volume:  Normal  Mood:  Euthymic  Affect:  Congruent  Thought Process:  Coherent  Orientation:  Full (Time, Place, and Person)  Thought Content:  WDL  Suicidal Thoughts:  No  Homicidal Thoughts:  No  Memory:  NA  Judgement:  Good  Insight:  Good  Psychomotor Activity:  Normal  Concentration:  Fair  Recall:  Good  Fund of Knowledge:Fair  Language: Good  Akathisia:  No  Handed:  Right  AIMS (if indicated):     Assets:  Communication Skills  Sleep:      Musculoskeletal: Strength &  Muscle Tone:  Gait & Station:  Patient leans:   Current Medications: Current Outpatient Prescriptions  Medication Sig Dispense Refill  . acetaminophen (TYLENOL) 325 MG tablet Take 162.5 mg by mouth every 6 (six) hours as needed for pain (for headache.).      Marland Kitchen albuterol (PROVENTIL HFA;VENTOLIN HFA) 108 (90 BASE) MCG/ACT inhaler Inhale 2 puffs into the lungs as needed.       Marland Kitchen atorvastatin (LIPITOR) 80 MG tablet TAKE 1 TABLET BY MOUTH DAILY FOR CHOLESTEROL  90 tablet  0  . bumetanide (BUMEX) 1 MG tablet Take 1 tablet (1 mg total) by mouth every morning.  90 tablet  2  . calcium carbonate (TUMS EX) 750 MG chewable tablet Chew 1 tablet by mouth daily.      . Cholecalciferol (VITAMIN D3) 2000 UNITS capsule Take 4,000 Units by mouth daily.       . diazepam (VALIUM) 10 MG tablet 1 qam  1  prn  60 tablet  5  . escitalopram (LEXAPRO) 20 MG tablet Take 1 tablet (20 mg total) by mouth daily.  90 tablet  1  . estradiol (ESTRACE) 2 MG tablet TAKE ONE TABLET BY MOUTH DAILY  90 tablet  0  . KLOR-CON M10 10 MEQ tablet       . levothyroxine (SYNTHROID, LEVOTHROID) 75 MCG tablet Take 1 tablet (75 mcg total) by mouth daily before breakfast.  90 tablet  1  . losartan-hydrochlorothiazide (HYZAAR) 100-25 MG per tablet Take 1 tablet by mouth daily.  90 tablet  2  . meclizine (ANTIVERT) 25 MG tablet Take 1 tablet (25 mg total) by mouth 3 (three) times daily as needed for dizziness or nausea.  30 tablet  1  . metoprolol succinate (TOPROL-XL) 50 MG 24 hr tablet Take 1 tablet (50 mg total) by mouth every morning.  90 tablet  3  . NUVIGIL 250 MG tablet TAKE 1 TABLET BY MOUTH EVERY DAY  30 tablet  1  . omeprazole (PRILOSEC) 20 MG capsule TAKE ONE CAPSULE BY MOUTH EVERY DAY  90 capsule  2  . potassium chloride (K-DUR) 10 MEQ tablet Take 20 mEq by mouth daily.       Marland Kitchen spironolactone (ALDACTONE) 25 MG tablet TAKE 1 TABLET EVERY DAY  90 tablet  1   No current facility-administered medications for this visit.    Lab  Results: No results found for this or any previous visit (from the past 48 hour(s)).  Physical Findings: AIMS:  , ,  ,  ,    CIWA:    COWS:     Treatment Plan Summary: At this time we shall continue her Valium but adjusted to take a 10 mg one in the morning and she may take a half or whole pill later in the day if needed. The patient will continue her Lexapro 20 mg. The patient gets her Nuvigil from her primary care doctor. The patient was referred a psychotherapist. This patient is not suicidal. This patient denies any new  specific physical or neurological symptoms at this time.  Plan:  Medical Decision Making Problem Points:  Established problem, stable/improving (1) Data Points:  Review of medication regiment & side effects (2)  I certify that inpatient services furnished can reasonably be expected to improve the patient's condition.   Jordynn Marcella, [place] 04/04/2014, 1:39 PM

## 2014-04-13 ENCOUNTER — Other Ambulatory Visit: Payer: Self-pay

## 2014-04-18 DIAGNOSIS — H538 Other visual disturbances: Secondary | ICD-10-CM | POA: Diagnosis not present

## 2014-04-26 NOTE — Telephone Encounter (Signed)
Sent a staff message to [place] with [facility] on 03/28/14 to request a download on this patient. On 03/30/14 she returns a message to me stating the RT has already sent two reports to [place]. Informed her that we have never received those reports. Requested for her resend the information. She tells me that he does not still have this information and he will need to re collect a download.

## 2014-04-30 ENCOUNTER — Telehealth: Payer: Self-pay | Admitting: Cardiovascular Disease

## 2014-04-30 NOTE — Telephone Encounter (Signed)
Please have [place] to call her tomorrow,still having problems with her sleep machine Pt says she is very,very dissatified with the way  this office calls her back.

## 2014-04-30 NOTE — Telephone Encounter (Signed)
Download received and placed in scan box with notation to scan into Dr. Jerrol Banana

## 2014-04-30 NOTE — Telephone Encounter (Signed)
Patient had called in on 9/23 with complaints r/t CPAP machine. She reports she never heard back. She states she had 3 sleep studies, the last on [address] and she states those people were NOT doing their job. She reports Jonni Sanger has been working with her and she has a Development worker, international aid and has a nose mask that makes her nose sore (states maybe she is allergic to the material?). She states that [place] told her the settings on the machine were not right. She states a download was done about 1 month ago - RN could not locate this in [place] and they will fax latest download, ending on 04/04/14

## 2014-05-03 DIAGNOSIS — H35373 Puckering of macula, bilateral: Secondary | ICD-10-CM | POA: Diagnosis not present

## 2014-05-10 ENCOUNTER — Encounter: Payer: Self-pay | Admitting: Internal Medicine

## 2014-05-23 ENCOUNTER — Ambulatory Visit: Payer: Self-pay | Admitting: Physician Assistant

## 2014-05-28 DIAGNOSIS — F331 Major depressive disorder, recurrent, moderate: Secondary | ICD-10-CM | POA: Diagnosis not present

## 2014-06-06 DIAGNOSIS — F331 Major depressive disorder, recurrent, moderate: Secondary | ICD-10-CM | POA: Diagnosis not present

## 2014-06-15 ENCOUNTER — Other Ambulatory Visit: Payer: Self-pay | Admitting: Internal Medicine

## 2014-06-18 DIAGNOSIS — F331 Major depressive disorder, recurrent, moderate: Secondary | ICD-10-CM | POA: Diagnosis not present

## 2014-06-19 ENCOUNTER — Other Ambulatory Visit: Payer: Self-pay | Admitting: *Deleted

## 2014-06-19 MED ORDER — ONDANSETRON HCL 4 MG PO TABS: 4.0000 mg | ORAL_TABLET | Freq: Three times a day (TID) | ORAL | Status: DC | PRN

## 2014-06-20 ENCOUNTER — Encounter: Payer: Self-pay | Admitting: Physician Assistant

## 2014-07-03 ENCOUNTER — Institutional Professional Consult (permissible substitution): Payer: Self-pay | Admitting: Internal Medicine

## 2014-07-05 DIAGNOSIS — H35373 Puckering of macula, bilateral: Secondary | ICD-10-CM | POA: Diagnosis not present

## 2014-07-09 ENCOUNTER — Other Ambulatory Visit: Payer: Self-pay | Admitting: Internal Medicine

## 2014-07-09 DIAGNOSIS — F331 Major depressive disorder, recurrent, moderate: Secondary | ICD-10-CM | POA: Diagnosis not present

## 2014-07-16 ENCOUNTER — Other Ambulatory Visit: Payer: Self-pay | Admitting: Obstetrics & Gynecology

## 2014-07-16 DIAGNOSIS — Z1231 Encounter for screening mammogram for malignant neoplasm of breast: Secondary | ICD-10-CM | POA: Diagnosis not present

## 2014-07-16 DIAGNOSIS — Z124 Encounter for screening for malignant neoplasm of cervix: Secondary | ICD-10-CM | POA: Diagnosis not present

## 2014-07-16 DIAGNOSIS — Z779 Other contact with and (suspected) exposures hazardous to health: Secondary | ICD-10-CM | POA: Diagnosis not present

## 2014-07-17 LAB — CYTOLOGY - PAP

## 2014-07-25 ENCOUNTER — Other Ambulatory Visit: Payer: Self-pay | Admitting: Internal Medicine

## 2014-07-25 DIAGNOSIS — F331 Major depressive disorder, recurrent, moderate: Secondary | ICD-10-CM | POA: Diagnosis not present

## 2014-07-31 ENCOUNTER — Ambulatory Visit (INDEPENDENT_AMBULATORY_CARE_PROVIDER_SITE_OTHER): Payer: Medicare Other | Admitting: Internal Medicine

## 2014-07-31 ENCOUNTER — Encounter: Payer: Self-pay | Admitting: Internal Medicine

## 2014-07-31 VITALS — BP 134/68 | HR 77 | Ht 63.5 in | Wt 176.0 lb

## 2014-07-31 DIAGNOSIS — G4733 Obstructive sleep apnea (adult) (pediatric): Secondary | ICD-10-CM | POA: Diagnosis not present

## 2014-07-31 NOTE — Patient Instructions (Signed)
Order- [facility]- request download for CPAP pressure compliance  Dx OSA  Order- referral to Orthodontist Dr Oneal Grout   Dx consider oral appliance for OSA
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
## 2014-07-31 NOTE — Assessment & Plan Note (Signed)
I don't think she needs new sleep study.  We will download her present machine to check pressure and compliance. I talked with her about oral appliance as an alternative. She had tried one from a dentist years ago that didn't work for her. Plan- download CPAP. Refer to Dr Ron Parker to consider oral appliance .

## 2014-08-02 DIAGNOSIS — H35372 Puckering of macula, left eye: Secondary | ICD-10-CM | POA: Diagnosis not present

## 2014-08-15 DIAGNOSIS — F331 Major depressive disorder, recurrent, moderate: Secondary | ICD-10-CM | POA: Diagnosis not present

## 2014-08-23 ENCOUNTER — Ambulatory Visit (INDEPENDENT_AMBULATORY_CARE_PROVIDER_SITE_OTHER): Payer: Medicare Other | Admitting: Internal Medicine

## 2014-08-23 ENCOUNTER — Encounter: Payer: Self-pay | Admitting: Internal Medicine

## 2014-08-23 VITALS — BP 122/70 | HR 76 | Temp 97.7°F | Resp 16 | Ht 63.5 in | Wt 176.8 lb

## 2014-08-23 DIAGNOSIS — Z0000 Encounter for general adult medical examination without abnormal findings: Secondary | ICD-10-CM | POA: Diagnosis not present

## 2014-08-23 DIAGNOSIS — F32A Depression, unspecified: Secondary | ICD-10-CM

## 2014-08-23 DIAGNOSIS — E782 Mixed hyperlipidemia: Secondary | ICD-10-CM | POA: Diagnosis not present

## 2014-08-23 DIAGNOSIS — E669 Obesity, unspecified: Secondary | ICD-10-CM

## 2014-08-23 DIAGNOSIS — Z79899 Other long term (current) drug therapy: Secondary | ICD-10-CM

## 2014-08-23 DIAGNOSIS — I5181 Takotsubo syndrome: Secondary | ICD-10-CM

## 2014-08-23 DIAGNOSIS — Z23 Encounter for immunization: Secondary | ICD-10-CM | POA: Diagnosis not present

## 2014-08-23 DIAGNOSIS — I10 Essential (primary) hypertension: Secondary | ICD-10-CM | POA: Diagnosis not present

## 2014-08-23 DIAGNOSIS — E559 Vitamin D deficiency, unspecified: Secondary | ICD-10-CM | POA: Diagnosis not present

## 2014-08-23 DIAGNOSIS — F332 Major depressive disorder, recurrent severe without psychotic features: Secondary | ICD-10-CM

## 2014-08-23 DIAGNOSIS — F329 Major depressive disorder, single episode, unspecified: Secondary | ICD-10-CM | POA: Diagnosis not present

## 2014-08-23 DIAGNOSIS — Z9181 History of falling: Secondary | ICD-10-CM

## 2014-08-23 DIAGNOSIS — N958 Other specified menopausal and perimenopausal disorders: Secondary | ICD-10-CM | POA: Diagnosis not present

## 2014-08-23 DIAGNOSIS — R7309 Other abnormal glucose: Secondary | ICD-10-CM | POA: Diagnosis not present

## 2014-08-23 DIAGNOSIS — I429 Cardiomyopathy, unspecified: Secondary | ICD-10-CM

## 2014-08-23 DIAGNOSIS — M8588 Other specified disorders of bone density and structure, other site: Secondary | ICD-10-CM | POA: Diagnosis not present

## 2014-08-23 DIAGNOSIS — G4730 Sleep apnea, unspecified: Secondary | ICD-10-CM

## 2014-08-23 DIAGNOSIS — Z1212 Encounter for screening for malignant neoplasm of rectum: Secondary | ICD-10-CM

## 2014-08-23 DIAGNOSIS — K7581 Nonalcoholic steatohepatitis (NASH): Secondary | ICD-10-CM

## 2014-08-23 DIAGNOSIS — Z1331 Encounter for screening for depression: Secondary | ICD-10-CM

## 2014-08-23 DIAGNOSIS — R7303 Prediabetes: Secondary | ICD-10-CM

## 2014-08-23 LAB — CBC WITH DIFFERENTIAL/PLATELET
BASOS PCT: 1 % (ref 0–1)
Basophils Absolute: 0.1 10*3/uL (ref 0.0–0.1)
EOS ABS: 0.2 10*3/uL (ref 0.0–0.7)
Eosinophils Relative: 2 % (ref 0–5)
HCT: 41.6 % (ref 36.0–46.0)
Hemoglobin: 13.4 g/dL (ref 12.0–15.0)
LYMPHS ABS: 2.4 10*3/uL (ref 0.7–4.0)
Lymphocytes Relative: 28 % (ref 12–46)
MCH: 25.8 pg — ABNORMAL LOW (ref 26.0–34.0)
MCHC: 32.2 g/dL (ref 30.0–36.0)
MCV: 80.2 fL (ref 78.0–100.0)
MPV: 9.7 fL (ref 8.6–12.4)
Monocytes Absolute: 0.7 10*3/uL (ref 0.1–1.0)
Monocytes Relative: 8 % (ref 3–12)
Neutro Abs: 5.2 10*3/uL (ref 1.7–7.7)
Neutrophils Relative %: 61 % (ref 43–77)
Platelets: 308 10*3/uL (ref 150–400)
RBC: 5.19 MIL/uL — ABNORMAL HIGH (ref 3.87–5.11)
RDW: 14.8 % (ref 11.5–15.5)
WBC: 8.5 10*3/uL (ref 4.0–10.5)

## 2014-08-23 NOTE — Patient Instructions (Signed)

## 2014-08-23 NOTE — Progress Notes (Signed)
Patient ID: Laura [name], female   DOB: 25-Jan-1946, 69 y.o.   MRN: [id number]  [facility] VISIT AND CPE  Assessment:   1. Essential hypertension  - Microalbumin / creatinine urine ratio - EKG 12-Lead - [place], RETROPERITNL ABD,  LTD - TSH  2. Takotsubo syndrome, June 2011.(normal coronaries)   3. Cardiomyopathy- EF 45-50% by echo 09/26/12   4. Mixed hyperlipidemia  - Lipid panel  5. Obesity   6. Steatohepatitis, nonalcoholic   7. Prediabetes  - Hemoglobin A1c - Insulin, fasting  8. Hx/o BiPolar Manic-Depressive Disorder   9. Depression, controlled   10. Sleep apnea- non compliant with C-pap   11. Screening for rectal cancer  - POC Hemoccult Bld/Stl (3-Cd Home Screen); Future  12. Depression screen   13. Medication management  - Urine Microscopic - CBC with Differential/Platelet - BASIC METABOLIC PANEL WITH GFR - Hepatic function panel - Magnesium  14. Vitamin D deficiency  - Vit D  25 hydroxy (rtn osteoporosis monitoring)  15. At low risk for fall   16. Routine general medical examination at a health care facility   17. Need for prophylactic vaccination against Streptococcus pneumoniae (pneumococcus)  - Pneumococcal conjugate vaccine 13-valent   Plan:   During the course of the visit the patient was educated and counseled about appropriate screening and preventive services including:    Pneumococcal vaccine   Influenza vaccine  Td vaccine  Screening electrocardiogram  Bone densitometry screening  Colorectal cancer screening  Diabetes screening  Glaucoma screening  Nutrition counseling   Advanced directives: requested  Screening recommendations, referrals: Vaccinations:  Immunization History  Administered Date(s) Administered  . Pneumococcal Conjugate-13 08/23/2014  . Pneumococcal Polysaccharide-23 05/18/2011  . Td 05/18/2011  . Zoster 03/31/2012  Hep B vaccine not indicated  Nutrition assessed and  recommended  Colonoscopy 11/21/2010 -Dr Teena Irani Recommended yearly ophthalmology/optometry visit for glaucoma screening and checkup Recommended yearly dental visit for hygiene and checkup Advanced directives - no  Conditions/risks identified: BMI: Discussed weight loss, diet, and increase physical activity.  Increase physical activity: AHA recommends 150 minutes of physical activity a week.  Medications reviewed T2_NIDDM  is not at goal, ACE/ARB therapy: Yes. Urinary Incontinence is not an issue: discussed non pharmacology and pharmacology options.  Fall risk: low- discussed PT, home fall assessment, medications.   Subjective:   Laura [name] is a 69 y.o. MWF with hx/o Bipolar  Manic Depressive Disorder who presents for Medicare Annual Wellness Visit and complete physical.  Date of last medicare wellness visit is unknown. She has had elevated blood pressure for years. Her blood pressure has been controlled at home, & today her BP is BP: 122/70 mmHg She does not workout. She denies chest pain, shortness of breath, dizziness.  She is on cholesterol medication and denies myalgias. Her cholesterol is at goal. The cholesterol last visit was:  Lab Results  Component Value Date   CHOL 139 02/09/2014   HDL 52 02/09/2014   LDLCALC 70 02/09/2014   TRIG 85 02/09/2014   CHOLHDL 2.7 02/09/2014    She has had T2_DM w/Stage 3 CKD for 2+ years  She has not been working on diet and exercise for T2_DM, and denies foot ulcerations, hyperglycemia, hypoglycemia , polydipsia, polyuria, visual disturbances and weight loss. Last A1C in the office was:  Lab Results  Component Value Date   HGBA1C 6.3* 02/09/2014   Patient is on Vitamin D supplement.   Lab Results  Component Value Date   VD25OH 73  02/09/2014     Names of Other Physician/Practitioners you currently use: 1. [facility] here for primary care 2.Dr Bing Plume, eye doctor, last visit last week 3. Dr  Braulio Bosch, dentist, last visit 1 year.  Patient Care Team: Unk Pinto, MD as PCP - General (Internal Medicine) Arta Silence, MD as Consulting Physician (Gastroenterology) Norma Fredrickson, MD as Consulting Physician (Psychiatry) Sanda Klein, MD as Consulting Physician (Cardiology) Linda Hedges, DO as Consulting Physician (Obstetrics and Gynecology) Linton Rump, MD as Consulting Physician (Ophthalmology)  Medication Review: Medication Sig  . acetaminophen (TYLENOL) 325 MG tablet Take 162.5 mg by mouth every 6 (six) hours as needed for pain (for headache.).  Marland Kitchen albuterol  HFA)  inhaler Inhale 2 puffs into the lungs as needed.   Marland Kitchen atorvastatin (LIPITOR) 80 MG tablet TAKE 1 TABLET BY MOUTH DAILY FOR CHOLESTEROL  . bumetanide (BUMEX) 1 MG tablet Take 1 tablet (1 mg total) by mouth every morning.  . TUMS EX  750 MG  Chew 1 tablet by mouth daily.  Marland Kitchen VITAMIN D3 2000 UNITS capsule Take 4,000 Units by mouth daily.   . diazepam (VALIUM) 10 MG tablet 1 qam  1  prn  . escitalopram (LEXAPRO) 20 MG tablet Take 1 tablet (20 mg total) by mouth daily.  Marland Kitchen estradiol (ESTRACE) 2 MG tablet TAKE ONE TABLET BY MOUTH DAILY  . KLOR-CON M10 10 MEQ tablet   . levothyroxine 75 MCG tablet Take 1 tablet (75 mcg total) by mouth daily before breakfast.  . losartan-hctz (HYZAAR) 100-25 MG per tablet Take 1 tablet by mouth daily.  . metoprolol succinate  -XL 50 MG 24 hr tablet Take 1 tablet (50 mg total) by mouth every morning.  Marland Kitchen NUVIGIL 250 MG tablet Take 1 tablet by mouth daily  . omeprazole (PRILOSEC) 20 MG capsule TAKE ONE CAPSULE BY MOUTH EVERY DAY  . spironolactone (ALDACTONE) 25 MG tablet TAKE 1 TABLET BY MOUTH EVERY DAY   Current Problems (verified) Patient Active Problem List   Diagnosis Date Noted  . Mixed hyperlipidemia 08/23/2014  . Major depressive disorder, recurrent episode, severe 09/22/2013  . Vitamin D Deficiency 07/28/2013  . Medication management 07/28/2013  . Prediabetes  07/28/2013  . Severe obstructive sleep apnea 05/27/2013  . Arrhythmia 04/14/2013  . Palpitations 03/12/2013  . Delayed gastric emptying 10/18/2012  . Steatohepatitis, nonalcoholic 43/15/4008  . Female stress incontinence 09/19/2012  . Takotsubo syndrome, June 2011.(normal coronaries) 09/12/2012  . Cardiomyopathy- EF 45-50% by echo 09/26/12 09/12/2012  . Anxiety disorder  09/12/2012  . HTN (hypertension) 09/12/2012  . LBBB (left bundle branch block) 09/12/2012  . Obesity 09/12/2012  . Sleep apnea- non compliant with C-pap 09/12/2012   Screening Tests Health Maintenance  Topic Date Due  . MAMMOGRAM  07/11/1995  . INFLUENZA VACCINE  01/27/2014  . COLONOSCOPY  11/20/2020  . TETANUS/TDAP  05/17/2021  . DEXA SCAN  Completed  . PNEUMOCOCCAL POLYSACCHARIDE VACCINE AGE 60 AND OVER  Completed  . ZOSTAVAX  Completed   Immunization History  Administered Date(s) Administered  . Pneumococcal Conjugate-13 08/23/2014  . Pneumococcal Polysaccharide-23 05/18/2011  . Td 05/18/2011  . Zoster 03/31/2012   Preventative care: Last colonoscopy:11/21/2010 - Dr Teena Irani  History reviewed: allergies, current medications, past family history, past medical history, past social history, past surgical history and problem list  Risk Factors: Tobacco History  Substance Use Topics  . Smoking status: Never Smoker   . Smokeless tobacco: Never Used  . Alcohol Use: No  She does not smoke.  Patient is not a former smoker. Are there smokers in your home (other than you)?  No Alcohol Current alcohol use: none  Caffeine Current caffeine use: coffee 1 cup /day  Exercise Current exercise: none  Nutrition/Diet Current diet: in general, a "healthy" diet    Cardiac risk factors: advanced age (older than 11 for men, 53 for women), diabetes mellitus, dyslipidemia, hypertension, obesity (BMI >= 30 kg/m2) and sedentary lifestyle.  Depression Screen (Note: if answer to either of the following is "Yes", a  more complete depression screening is indicated)   Q1: Over the past two weeks, have you felt down, depressed or hopeless? No  Q2: Over the past two weeks, have you felt little interest or pleasure in doing things? No  Have you lost interest or pleasure in daily life? No  Do you often feel hopeless? No  Do you cry easily over simple problems? No  Activities of Daily Living In your present state of health, do you have any difficulty performing the following activities?:  Driving? No Managing money?  No Feeding yourself? No Getting from bed to chair? No Climbing a flight of stairs? No Preparing food and eating?: No Bathing or showering? No Getting dressed: No Getting to the toilet? No Using the toilet:No Moving around from place to place: No In the past year have you fallen or had a near fall?:No   Are you sexually active?  No  Do you have more than one partner?  No  Vision Difficulties: No  Hearing Difficulties: No Do you often ask people to speak up or repeat themselves? No Do you experience ringing or noises in your ears? No Do you have difficulty understanding soft or whispered voices? Sometimes.  Cognition  Do you feel that you have a problem with memory?No  Do you often misplace items? No  Do you feel safe at home?  Yes  Advanced directives Does patient have a [place]? No - offered forms Does patient have a Living Will? No - offeed forms  Past Medical History  Diagnosis Date  . Hypothyroidism   . Anemia   . Seasonal allergies   . Depression   . SUI (stress urinary incontinence, female)   . OSA (obstructive sleep apnea) MODERATE PER STUDY 2005    CPAP NONCOMPLIANT  . History of kidney stones   . Nonischemic dilated cardiomyopathy     MODERATELY DEPRESSED LVF;EF 35-45% by Echo 05/27/11  . LBBB (left bundle branch block)   . Left ventricular ejection fraction less than 40%     38% PER CARDIOLOGIST NOTE (DR CROITORU)  . Short of breath on  exertion   . Chronic combined systolic and diastolic CHF, NYHA class 2 CARDIOLOGIST-  DR NWGNFAOZ  . Hypertension   . History of non-ST elevation myocardial infarction (NSTEMI) JUNE 2011    SECONDARY TO TAKOTSUDO SYNDROME (CARDIAC CATH NORMAL)  . Hyperlipemia   . Anxiety   . Asthma     related to sesonal allergies   Past Surgical History  Procedure Laterality Date  . Nasal septum surgery  1980's  . Abdominal hysterectomy  1985    partial  . Cataract extraction w/ intraocular lens  implant, bilateral    . Right ureteroscopic stone extraction  08-31-2000  . Knee arthroscopy w/ meniscectomy  07-27-2011    MEDIAL AND LATERAL  . Transthoracic echocardiogram  05-27-2011  DR CROITORU    MODERATELY DEPRESSED LVF DUE TO GLOBAL HYPOKINESIS AND MARKED  SYSTOLIC ASYNCHRONY/ EF 25%/ MILD LEFT ATRIAL DILATATION  . Cardiac catheterization  09-04-1999;  08/25/2004;   12/09/2009  DR CROITORU    NORMAL CORONARIES/  APICAL BALLOONING OF LV CONSISTENT WITH TAKOTSUBO SYMPTOMS/ EF 30-35%  . Pubovaginal sling N/A 09/19/2012    Procedure: SUBURETHRAL Janyth Pupa;  Surgeon: Bernestine Amass, MD;  [facility];  Service: Urology;  Laterality: N/A;  . Cystoscopy N/A 09/19/2012    Procedure: CYSTOSCOPY FLEXIBLE;  Surgeon: Bernestine Amass, MD;  Location: [facility];  Service: Urology;  Laterality: N/A;  . Cholecystectomy N/A 09/29/2012    Procedure: LAPAROSCOPIC CHOLECYSTECTOMY WITH INTRAOPERATIVE CHOLANGIOGRAM;  Surgeon: Adin Hector, MD;  Location: [facility];  Service: General;  Laterality: N/A;    ROS: Constitutional: Denies fever, chills, weight loss/gain, headaches, insomnia, fatigue, night sweats, and change in appetite. Eyes: Denies redness, blurred vision, diplopia, discharge, itchy, watery eyes.  ENT: Denies discharge, congestion, post nasal drip, epistaxis, sore throat, earache, hearing loss, dental pain, Tinnitus, Vertigo, Sinus pain, snoring.  Cardio: Denies chest pain,  palpitations, irregular heartbeat, syncope, dyspnea, diaphoresis, orthopnea, PND, claudication, edema Respiratory: denies cough, dyspnea, DOE, pleurisy, hoarseness, laryngitis, wheezing.  Gastrointestinal: Denies dysphagia, heartburn, reflux, water brash, pain, cramps, nausea, vomiting, bloating, diarrhea, constipation, hematemesis, melena, hematochezia, jaundice, hemorrhoids Genitourinary: Denies dysuria, frequency, urgency, nocturia, hesitancy, discharge, hematuria, flank pain Breast: Breast lumps, nipple discharge, bleeding.  Musculoskeletal: Denies arthralgia, myalgia, stiffness, Jt. Swelling, pain, limp, and strain/sprain. Denies falls. Skin: Denies puritis, rash, hives, warts, acne, eczema, changing in skin lesion Neuro: No weakness, tremor, incoordination, spasms, paresthesia, pain Psychiatric: Denies confusion, memory loss, sensory loss. Denies Depression. Endocrine: Denies change in weight, skin, hair change, nocturia, and paresthesia, diabetic polys, visual blurring, hyper / hypo glycemic episodes.  Heme/Lymph: No excessive bleeding, bruising, enlarged lymph nodes  Objective:     BP 122/70 mmHg  Pulse 76  Temp(Src) 97.7 F (36.5 C)  Resp 16  Ht 5' 3.5" (1.613 m)  Wt 176 lb 12.8 oz (80.196 kg)  BMI 30.82 kg/m2  General Appearance: Well nourished, alert, WD/WN, femaleand in no apparent distress. Eyes: PERRLA, EOMs, conjunctiva no swelling or erythema, normal fundi and vessels. Sinuses: No frontal/maxillary tenderness ENT/Mouth: EACs patent / TMs  nl. Nares clear without erythema, swelling, mucoid exudates. Oral hygiene is good. No erythema, swelling, or exudate. Tongue normal, non-obstructing. Tonsils not swollen or erythematous. Hearing normal.  Neck: Supple, thyroid normal. No bruits, nodes or JVD. Respiratory: Respiratory effort normal.  BS equal and clear bilateral without rales, rhonci, wheezing or stridor. Cardio: Heart sounds are normal with regular rate and rhythm and no  murmurs, rubs or gallops. Peripheral pulses are normal and equal bilaterally without edema. No aortic or femoral bruits. Chest: symmetric with normal excursions and percussion. Breasts: Symmetric, without lumps, nipple discharge, retractions, or fibrocystic changes.  Abdomen: Flat, soft  with nl bowel sounds. Nontender, no guarding, rebound, hernias, masses, or organomegaly.  Lymphatics: Non tender without lymphadenopathy.  Genitourinary:  Musculoskeletal: Full ROM all peripheral extremities, joint stability, 5/5 strength, and normal gait. Skin: Warm and dry without rashes, lesions, cyanosis, clubbing or  ecchymosis.  Neuro: Cranial nerves intact, reflexes equal bilaterally. Normal muscle tone, no cerebellar symptoms. Sensation intact.  Pysch: Awake and oriented X 3, normal affect, Insight and Judgment appropriate.   Cognitive Testing  Alert? Yes  Normal Appearance?Yes  Oriented to person? Yes  Place? Yes   Time? Yes  Recall of three objects?  Yes  Can perform simple calculations?  Yes  Displays appropriate judgment? Yes  Can read the correct time from a watch/clock?Yes  Medicare Attestation I have personally reviewed: The patient's medical and social history Their use of alcohol, tobacco or illicit drugs Their current medications and supplements The patient's functional ability including ADLs,fall risks, home safety risks, cognitive, and hearing and visual impairment Diet and physical activities Evidence for depression or mood disorders  The patient's weight, height, BMI, and visual acuity have been recorded in the chart.  I have made referrals, counseling, and provided education to the patient based on review of the above and I have provided the patient with a written personalized care plan for preventive services.    Imraan Wendell DAVID, MD   08/23/2014   (*) patient seemed to have criticisms of 4-5 of her current /previous physicians. During the early portion of the encounter she  "sang my praises" to me. At the end of the appointment,  she raised criticisms about some billing issues about a recent visit by her son-in-law who had been asked to pay a 33% portion of his deductible for the visit and that her daughter called the insurance department and had a run-in. I explained to the patient that a medical office like any business must try to collect it's accounts if it's to stay in business and also pay it's employees. Patient became indignant and at checkout refused to schedule further recommended follow-ups.

## 2014-08-24 LAB — MICROALBUMIN / CREATININE URINE RATIO
Creatinine, Urine: 169.8 mg/dL
Microalb Creat Ratio: 5.3 mg/g (ref 0.0–30.0)
Microalb, Ur: 0.9 mg/dL (ref <2.0)

## 2014-08-24 LAB — LIPID PANEL
CHOLESTEROL: 253 mg/dL — AB (ref 0–200)
HDL: 50 mg/dL (ref >=46)
LDL Cholesterol: 128 mg/dL — ABNORMAL HIGH (ref 0–99)
Total CHOL/HDL Ratio: 5.1 Ratio
Triglycerides: 373 mg/dL — ABNORMAL HIGH (ref <150)
VLDL: 75 mg/dL — ABNORMAL HIGH (ref 0–40)

## 2014-08-24 LAB — URINALYSIS, MICROSCOPIC ONLY
Bacteria, UA: NONE SEEN
CASTS: NONE SEEN
Crystals: NONE SEEN

## 2014-08-24 LAB — HEPATIC FUNCTION PANEL
ALK PHOS: 76 U/L (ref 39–117)
ALT: 16 U/L (ref 0–35)
AST: 18 U/L (ref 0–37)
Albumin: 4.5 g/dL (ref 3.5–5.2)
BILIRUBIN DIRECT: 0.1 mg/dL (ref 0.0–0.3)
BILIRUBIN INDIRECT: 0.2 mg/dL (ref 0.2–1.2)
TOTAL PROTEIN: 7.7 g/dL (ref 6.0–8.3)
Total Bilirubin: 0.3 mg/dL (ref 0.2–1.2)

## 2014-08-24 LAB — BASIC METABOLIC PANEL WITH GFR
BUN: 19 mg/dL (ref 6–23)
CHLORIDE: 99 meq/L (ref 96–112)
CO2: 28 meq/L (ref 19–32)
Calcium: 9.8 mg/dL (ref 8.4–10.5)
Creat: 1.09 mg/dL (ref 0.50–1.10)
GFR, Est African American: 60 mL/min
GFR, Est Non African American: 52 mL/min — ABNORMAL LOW
Glucose, Bld: 90 mg/dL (ref 70–99)
Potassium: 4.5 mEq/L (ref 3.5–5.3)
Sodium: 140 mEq/L (ref 135–145)

## 2014-08-24 LAB — TSH: TSH: 1.874 u[IU]/mL (ref 0.350–4.500)

## 2014-08-24 LAB — HEMOGLOBIN A1C
HEMOGLOBIN A1C: 5.9 % — AB (ref <5.7)
MEAN PLASMA GLUCOSE: 123 mg/dL — AB (ref <117)

## 2014-08-24 LAB — VITAMIN D 25 HYDROXY (VIT D DEFICIENCY, FRACTURES): VIT D 25 HYDROXY: 40 ng/mL (ref 30–100)

## 2014-08-24 LAB — INSULIN, FASTING: INSULIN FASTING, SERUM: 20.5 u[IU]/mL — AB (ref 2.0–19.6)

## 2014-08-24 LAB — MAGNESIUM: Magnesium: 2.2 mg/dL (ref 1.5–2.5)

## 2014-08-25 ENCOUNTER — Other Ambulatory Visit: Payer: Self-pay | Admitting: Internal Medicine

## 2014-08-27 ENCOUNTER — Telehealth: Payer: Self-pay | Admitting: Cardiovascular Disease

## 2014-08-27 NOTE — Telephone Encounter (Signed)
Renal function was just at the upper and of normal not really in abnormal range, but was a little worse than her baseline. I suspect this is the reason it was recommended that she stop her diuretic. I think it's perfectly reasonable for her to stop the diuretic at least temporarily, as long as she restricts sodium intake and weighs herself on a daily basis. She should call if she develops weight gain over 3 pounds or swelling or shortness of breath. Lipid numbers are much worse than in the past, I suspect because she has stopped cholesterol-lowering medicines

## 2014-08-27 NOTE — Telephone Encounter (Signed)
Returned call to patient. Informed her of advice per Dr. Loletha Grayer. She voiced understanding and was appreciative of explanation.   Patient also reports she was NOT fasting for her lab work. She was advised to make her PCP aware of this.

## 2014-08-27 NOTE — Telephone Encounter (Signed)
Please call,have some question about her medicine.

## 2014-08-27 NOTE — Telephone Encounter (Signed)
Returned call to patient. She states she had some labs done by her PCP and wanted to Dr. Victorino December opinion as to if she should really stop her bumex (this was recommended to her via MyChart from PCP). She wanted some clarification on the other labs, if Dr. Sallyanne Kuster would take a look at them, specifically in regards to her renal function and cholesterol.

## 2014-08-30 DIAGNOSIS — N390 Urinary tract infection, site not specified: Secondary | ICD-10-CM | POA: Diagnosis not present

## 2014-09-05 ENCOUNTER — Ambulatory Visit (INDEPENDENT_AMBULATORY_CARE_PROVIDER_SITE_OTHER): Payer: Medicare Other | Admitting: Psychiatry

## 2014-09-05 ENCOUNTER — Encounter (HOSPITAL_COMMUNITY): Payer: Self-pay | Admitting: Psychiatry

## 2014-09-05 VITALS — BP 154/79 | HR 81 | Ht 63.5 in | Wt 177.8 lb

## 2014-09-05 DIAGNOSIS — F331 Major depressive disorder, recurrent, moderate: Secondary | ICD-10-CM

## 2014-09-05 DIAGNOSIS — F328 Other depressive episodes: Secondary | ICD-10-CM | POA: Diagnosis not present

## 2014-09-05 MED ORDER — ESCITALOPRAM OXALATE 20 MG PO TABS: 20.0000 mg | ORAL_TABLET | Freq: Every day | ORAL | Status: DC

## 2014-09-05 MED ORDER — DIAZEPAM 10 MG PO TABS: ORAL_TABLET | ORAL | Status: DC

## 2014-09-05 NOTE — Progress Notes (Signed)
[facility] MD Progress Note  09/05/2014 1:56 PM Laura [name]  MRN:  [id number] Subjective: Angry This patient is miserable. She is constantly irritable. It is due to multiple factors most likely. Basic knowledge is it likely is related to her problems with sleep. She's having constant adjustments of her mask for sleep apnea. She does not sleep well. She wakes up in the morning and does not feel rested. Her sleep apnea machine is continuously being adjusted. The patient also claims that she has low blood sugar. This is because she has difficulty eating because she has a lot of nausea. She is aware that her blood sugar is low she still has an appetite but she realizes that she is hypoglycemic. She says she feels restless. I think is due to multiple factors including possibly the character trait of being irritable. I do not believe this patient is mania. She does have an appetite. She does have normal energy. I think she can think and concentrate fairly well. She does read her Bible and does watch some TV with her husband. Overall she claims about him does help. She says she's tried many benzodiazepines and Valium is the best one. She says that she wakes up she starts feeling nervous and she takes 10 mg of Valium and by late morning she feels better. She feels calmer. It on the other hand she says she doesn't feel herself and doesn't feel right more than half of the week. It is hard to clarify what exact symptoms she is experiencing. I do not think she is experiencing persistent daily depression. The patient is not suicidal. She claims Lexapro does help but is not clear how. The biggest intervention she gets from her primary care doctor is to take Nuvigil which she says helps her feel alive and awake after she takes it. Overall this patient is not changed. She's not suicidal. She is not psychotic. She denies the use of alcohol or drugs. The patient says she does fine as long as nobody gets in her way. She claims she can  be irritable angry and even at times even violent. The patient has not acted aggressive but she feels aggressive inside and expresses that with me. The patient continued to see her therapist Sula Soda once a month. She says Sula Soda is very good and very kind. I might clear what therapeutic effect Sula Soda is having at this time. Principal Problem: @PPROB @ Diagnosis:   Patient Active Problem List   Diagnosis Date Noted  . Mixed hyperlipidemia [E78.2] 08/23/2014  . Major depressive disorder, recurrent episode, severe [F33.2] 09/22/2013  . Vitamin D Deficiency [E55.9] 07/28/2013  . Medication management [Z79.899] 07/28/2013  . Prediabetes [R73.09] 07/28/2013  . Severe obstructive sleep apnea [G47.33] 05/27/2013  . Arrhythmia [I49.9] 04/14/2013  . Palpitations [R00.2] 03/12/2013  . Delayed gastric emptying [K30] 10/18/2012  . Steatohepatitis, nonalcoholic [Z56.38] 75/64/3329  . Female stress incontinence [N39.3] 09/19/2012  . Takotsubo syndrome, June 2011.(normal coronaries) [I51.81] 09/12/2012  . Cardiomyopathy- EF 45-50% by echo 09/26/12 [I42.9] 09/12/2012  . Anxiety disorder  [F41.9] 09/12/2012  . HTN (hypertension) [I10] 09/12/2012  . LBBB (left bundle branch block) [I44.7] 09/12/2012  . Obesity [E66.9] 09/12/2012  . Sleep apnea- non compliant with C-pap [G47.30] 09/12/2012   Total Time spent with patient: 30 minutes   Past Medical History:  Past Medical History  Diagnosis Date  . Hypothyroidism   . Anemia   . Seasonal allergies   . Depression   . SUI (stress urinary incontinence, female)   .  OSA (obstructive sleep apnea) MODERATE PER STUDY 2005    CPAP NONCOMPLIANT  . History of kidney stones   . Nonischemic dilated cardiomyopathy     MODERATELY DEPRESSED LVF;EF 35-45% by Echo 05/27/11  . LBBB (left bundle branch block)   . Left ventricular ejection fraction less than 40%     38% PER CARDIOLOGIST NOTE (DR CROITORU)  . Short of breath on exertion   . Chronic combined systolic and  diastolic CHF, NYHA class 2 CARDIOLOGIST-  DR [id number]  . Hypertension   . History of non-ST elevation myocardial infarction (NSTEMI) JUNE 2011    SECONDARY TO TAKOTSUDO SYNDROME (CARDIAC CATH NORMAL)  . Hyperlipemia   . Anxiety   . Asthma     related to sesonal allergies    Past Surgical History  Procedure Laterality Date  . Nasal septum surgery  1980's  . Abdominal hysterectomy  1985    partial  . Cataract extraction w/ intraocular lens  implant, bilateral    . Right ureteroscopic stone extraction  08-31-2000  . Knee arthroscopy w/ meniscectomy  07-27-2011    MEDIAL AND LATERAL  . Transthoracic echocardiogram  05-27-2011  DR CROITORU    MODERATELY DEPRESSED LVF DUE TO GLOBAL HYPOKINESIS AND MARKED SYSTOLIC ASYNCHRONY/ EF 94%/ MILD LEFT ATRIAL DILATATION  . Cardiac catheterization  09-04-1999;  08/25/2004;   12/09/2009  DR CROITORU    NORMAL CORONARIES/  APICAL BALLOONING OF LV CONSISTENT WITH TAKOTSUBO SYMPTOMS/ EF 30-35%  . Pubovaginal sling N/A 09/19/2012    Procedure: SUBURETHRAL Janyth Pupa;  Surgeon: Bernestine Amass, MD;  Location: [facility];  Service: Urology;  Laterality: N/A;  . Cystoscopy N/A 09/19/2012    Procedure: CYSTOSCOPY FLEXIBLE;  Surgeon: Bernestine Amass, MD;  [facility];  Service: Urology;  Laterality: N/A;  . Cholecystectomy N/A 09/29/2012    Procedure: LAPAROSCOPIC CHOLECYSTECTOMY WITH INTRAOPERATIVE CHOLANGIOGRAM;  Surgeon: Adin Hector, MD;  Location: [place];  Service: General;  Laterality: N/A;   Family History:  Family History  Problem Relation Age of Onset  . Pneumonia Mother   . Hypertension Mother   . Heart attack Father   . Fibromyalgia Brother   . Pulmonary embolism Brother   . Hypertension Brother    Social History:  History  Alcohol Use No     History  Drug Use No    History   Social History  . Marital Status: Married    Spouse Name: N/A  . Number of Children: N/A  . Years of Education: N/A    Social History Main Topics  . Smoking status: Never Smoker   . Smokeless tobacco: Never Used  . Alcohol Use: No  . Drug Use: No  . Sexual Activity: No   Other Topics Concern  . None   Social History Narrative   Additional History:    Sleep: Poor  Appetite:  Fair   Assessment:   Musculoskeletal: Strength & Muscle Tone:  Gait & Station:  Patient leans:    Psychiatric Specialty Exam: Physical Exam  ROS  Blood pressure 154/79, pulse 81, height 5' 3.5" (1.613 m), weight 177 lb 12.8 oz (80.65 kg).Body mass index is 31 kg/(m^2).  General Appearance: Casual  Eye Contact::  Fair  Speech:  Clear and Coherent  Volume:  Normal  Mood:  Angry  Affect:  Blunt  Thought Proc normal   Orientation:  Full (Time, Place, and Person)  Thought Content:  WDL  Suicidal Thoughts:  No  Homicidal Thoughts:  No  Memory:  NA  Judgement:  Fair  Insight:  Fair  Psychomotor Activity:  Increased  Concentration:  Fair  Recall:  NA  Fund of Knowledge:Good  Language: Good  Akathisia:  No  Handed:  Right  AIMS (if indicated):     Assets:  Communication Skills  ADL's:  Intact  Cognition: WNL  Sleep:        Current Medications: Current Outpatient Prescriptions  Medication Sig Dispense Refill  . acetaminophen (TYLENOL) 325 MG tablet Take 162.5 mg by mouth every 6 (six) hours as needed for pain (for headache.).    Marland Kitchen albuterol (PROVENTIL HFA;VENTOLIN HFA) 108 (90 BASE) MCG/ACT inhaler Inhale 2 puffs into the lungs as needed.     Marland Kitchen atorvastatin (LIPITOR) 80 MG tablet TAKE 1 TABLET BY MOUTH DAILY FOR CHOLESTEROL 90 tablet 0  . bumetanide (BUMEX) 1 MG tablet Take 1 tablet (1 mg total) by mouth every morning. 90 tablet 2  . calcium carbonate (TUMS EX) 750 MG chewable tablet Chew 1 tablet by mouth daily.    . Cholecalciferol (VITAMIN D3) 2000 UNITS capsule Take 4,000 Units by mouth daily.     . diazepam (VALIUM) 10 MG tablet 1 qam  1  prn 60 tablet 5  . escitalopram (LEXAPRO) 20 MG tablet  Take 1 tablet (20 mg total) by mouth daily. 90 tablet 1  . estradiol (ESTRACE) 2 MG tablet TAKE ONE TABLET BY MOUTH DAILY 90 tablet 0  . KLOR-CON M10 10 MEQ tablet     . levothyroxine (SYNTHROID, LEVOTHROID) 75 MCG tablet Take 1 tablet (75 mcg total) by mouth daily before breakfast. 90 tablet 1  . losartan-hydrochlorothiazide (HYZAAR) 100-25 MG per tablet Take 1 tablet by mouth daily. 90 tablet 2  . metoprolol succinate (TOPROL-XL) 50 MG 24 hr tablet Take 1 tablet (50 mg total) by mouth every morning. 90 tablet 3  . NUVIGIL 250 MG tablet Take 1 tablet by mouth daily 30 tablet 2  . omeprazole (PRILOSEC) 20 MG capsule TAKE ONE CAPSULE BY MOUTH EVERY DAY 90 capsule 2  . spironolactone (ALDACTONE) 25 MG tablet TAKE 1 TABLET BY MOUTH EVERY DAY 90 tablet 1   No current facility-administered medications for this visit.    Lab Results: No results found for this or any previous visit (from the past 48 hour(s)).  Physical Findings: AIMS:  , ,  ,  ,    CIWA:    COWS:     Treatment Plan Summary: At this time the patient will continue taking Valium 10 mg twice a day and continue Lexapro 20 mg. She'll continue in therapy and she'll return to see me in 4 months. I think this patient is baseline. This patient has been tried on multiple psychotropic medications at this time Valium and Lexapro seemed to be the best.   Medical Decision Making:  Review of Medication Regimen & Side Effects (2)     Kaedin Hicklin IRVING 09/05/2014, 1:56 PM

## 2014-09-07 DIAGNOSIS — N390 Urinary tract infection, site not specified: Secondary | ICD-10-CM | POA: Diagnosis not present

## 2014-09-12 DIAGNOSIS — F331 Major depressive disorder, recurrent, moderate: Secondary | ICD-10-CM | POA: Diagnosis not present

## 2014-09-17 ENCOUNTER — Other Ambulatory Visit: Payer: Self-pay | Admitting: Cardiovascular Disease

## 2014-09-17 MED ORDER — METOPROLOL SUCCINATE ER 50 MG PO TB24: 50.0000 mg | ORAL_TABLET | ORAL | Status: DC

## 2014-09-17 NOTE — Telephone Encounter (Signed)
Received a call from [facility].Ok to refill metoprolol 50 mg.

## 2014-09-17 NOTE — Telephone Encounter (Signed)
Calling about a medication refill that they have been faxing since about a month on her Metoprolol.Laura [name]

## 2014-09-17 NOTE — Telephone Encounter (Signed)
Pt says she still have not received her Metoprolol.Pt is very upset,says pharmacist have been faxing and faxing refill and still have not received it.

## 2014-10-01 ENCOUNTER — Telehealth: Payer: Self-pay | Admitting: Internal Medicine

## 2014-10-01 ENCOUNTER — Ambulatory Visit (INDEPENDENT_AMBULATORY_CARE_PROVIDER_SITE_OTHER): Payer: Medicare Other | Admitting: Internal Medicine

## 2014-10-01 ENCOUNTER — Encounter: Payer: Self-pay | Admitting: Internal Medicine

## 2014-10-01 VITALS — BP 112/72 | HR 84 | Ht 63.5 in | Wt 172.8 lb

## 2014-10-01 DIAGNOSIS — G4730 Sleep apnea, unspecified: Secondary | ICD-10-CM | POA: Diagnosis not present
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
## 2014-10-01 NOTE — Patient Instructions (Signed)
It sounds as if you are off to a good start with your mouth piece.   Please call us if you need our help.

## 2014-10-01 NOTE — Telephone Encounter (Signed)
Left message to schedule 23mth with Dr Melford Aase, on home and cell phone.  Thank you, Katrina Judeth Horn [facility] [phone] Fax [phone]

## 2014-10-07 NOTE — Assessment & Plan Note (Signed)
Never comfortable with CPAP. Seems much happier and motivated to make mouth piece work for her.

## 2014-10-08 DIAGNOSIS — Z1389 Encounter for screening for other disorder: Secondary | ICD-10-CM | POA: Diagnosis not present

## 2014-10-08 DIAGNOSIS — F329 Major depressive disorder, single episode, unspecified: Secondary | ICD-10-CM | POA: Diagnosis not present

## 2014-10-08 DIAGNOSIS — N390 Urinary tract infection, site not specified: Secondary | ICD-10-CM | POA: Diagnosis not present

## 2014-10-08 DIAGNOSIS — Z6830 Body mass index (BMI) 30.0-30.9, adult: Secondary | ICD-10-CM | POA: Diagnosis not present

## 2014-10-08 DIAGNOSIS — G4730 Sleep apnea, unspecified: Secondary | ICD-10-CM | POA: Diagnosis not present

## 2014-10-08 DIAGNOSIS — E7870 Disorder of bile acid and cholesterol metabolism, unspecified: Secondary | ICD-10-CM | POA: Diagnosis not present

## 2014-10-08 DIAGNOSIS — I2510 Atherosclerotic heart disease of native coronary artery without angina pectoris: Secondary | ICD-10-CM | POA: Diagnosis not present

## 2014-10-11 ENCOUNTER — Ambulatory Visit (INDEPENDENT_AMBULATORY_CARE_PROVIDER_SITE_OTHER): Payer: Medicare Other | Admitting: Cardiovascular Disease

## 2014-10-11 ENCOUNTER — Encounter: Payer: Self-pay | Admitting: Cardiovascular Disease

## 2014-10-11 VITALS — BP 136/76 | HR 72 | Ht 63.0 in | Wt 173.4 lb

## 2014-10-11 DIAGNOSIS — I429 Cardiomyopathy, unspecified: Secondary | ICD-10-CM

## 2014-10-11 DIAGNOSIS — I10 Essential (primary) hypertension: Secondary | ICD-10-CM

## 2014-10-11 DIAGNOSIS — G4733 Obstructive sleep apnea (adult) (pediatric): Secondary | ICD-10-CM

## 2014-10-11 DIAGNOSIS — I447 Left bundle-branch block, unspecified: Secondary | ICD-10-CM | POA: Diagnosis not present

## 2014-10-11 NOTE — Patient Instructions (Signed)
Dr Sallyanne Kuster recommends that you schedule a follow-up appointment in 6 months. You will receive a reminder letter in the mail two months in advance. If you don't receive a letter, please call our office to schedule the follow-up appointment.

## 2014-10-12 ENCOUNTER — Encounter: Payer: Self-pay | Admitting: Internal Medicine

## 2014-10-13 NOTE — Progress Notes (Signed)
Patient ID: Laura [name], female   DOB: 12-Jan-1946, 69 y.o.   MRN: [id number]     Cardiology Office Note   Date:  10/13/2014   ID:  Laura [name], DOB 04-May-1946, MRN [id number]  PCP:  Velna Hatchet, MD  Cardiologist:   Sanda Klein, MD   Chief Complaint  Patient presents with  . Annual Exam    1 year, pt denied chest pain, swelling, dizziness, leg pain. Ran out of Metoprolol for about a week mid March.      History of Present Illness: Laura [name] is a 69 y.o. female who presents for Nonischemic cardiomyopathy, left bundle branch block, hypertension, PVCs.  Laura [name] does not have complaints of shortness of breath, syncope, new neurological issues or any change in her occasional palpitations. She feels great and attributes her improvement to a supplement that she is taking (squid derived omega-3 FFA, resveratrol and CoQ10)  Laura [name] has chronic nonischemic cardiomyopathy. When she first presented in 2011 she had chest pain and normal coronaries by angiography. She was felt to have takotsubo syndrome. However, her left ventricular ejection fraction never improved and she was subsequently diagnosed with dilated nonischemic cardiomyopathy. Her ejection fraction has been as low as 25% but on appropriate heart failure medications has increased to 45-50%. She has a history of frequent PVCs that are intermittently symptomatic. She has obstructive sleep apnea and is wearing CPAP. She is followed by Dr. Annamaria Boots for OSA.    Past Medical History  Diagnosis Date  . Hypothyroidism   . Anemia   . Seasonal allergies   . Depression   . SUI (stress urinary incontinence, female)   . OSA (obstructive sleep apnea) MODERATE PER STUDY 2005    CPAP NONCOMPLIANT  . History of kidney stones   . Nonischemic dilated cardiomyopathy     MODERATELY DEPRESSED LVF;EF 35-45% by Echo 05/27/11  . LBBB (left bundle branch block)   . Left ventricular ejection fraction less than 40%     38% PER CARDIOLOGIST NOTE  (DR Marionette Meskill)  . Short of breath on exertion   . Chronic combined systolic and diastolic CHF, NYHA class 2 CARDIOLOGIST-  DR QMVHQION  . Hypertension   . History of non-ST elevation myocardial infarction (NSTEMI) JUNE 2011    SECONDARY TO TAKOTSUDO SYNDROME (CARDIAC CATH NORMAL)  . Hyperlipemia   . Anxiety   . Asthma     related to sesonal allergies    Past Surgical History  Procedure Laterality Date  . Nasal septum surgery  1980's  . Abdominal hysterectomy  1985    partial  . Cataract extraction w/ intraocular lens  implant, bilateral    . Right ureteroscopic stone extraction  08-31-2000  . Knee arthroscopy w/ meniscectomy  07-27-2011    MEDIAL AND LATERAL  . Transthoracic echocardiogram  05-27-2011  DR Tashya Alberty    MODERATELY DEPRESSED LVF DUE TO GLOBAL HYPOKINESIS AND MARKED SYSTOLIC ASYNCHRONY/ EF 62%/ MILD LEFT ATRIAL DILATATION  . Cardiac catheterization  09-04-1999;  08/25/2004;   12/09/2009  DR Jakori Burkett    NORMAL CORONARIES/  APICAL BALLOONING OF LV CONSISTENT WITH TAKOTSUBO SYMPTOMS/ EF 30-35%  . Pubovaginal sling N/A 09/19/2012    Procedure: SUBURETHRAL Janyth Pupa;  Surgeon: Bernestine Amass, MD;  Location: [place];  Service: Urology;  Laterality: N/A;  . Cystoscopy N/A 09/19/2012    Procedure: CYSTOSCOPY FLEXIBLE;  Surgeon: Bernestine Amass, MD;  Location: [facility];  Service: Urology;  Laterality: N/A;  . Cholecystectomy N/A 09/29/2012  Procedure: LAPAROSCOPIC CHOLECYSTECTOMY WITH INTRAOPERATIVE CHOLANGIOGRAM;  Surgeon: Adin Hector, MD;  Location: [place];  Service: General;  Laterality: N/A;     Current Outpatient Prescriptions  Medication Sig Dispense Refill  . acetaminophen (TYLENOL) 325 MG tablet Take 162.5 mg by mouth every 6 (six) hours as needed for pain (for headache.).    Marland Kitchen albuterol (PROVENTIL HFA;VENTOLIN HFA) 108 (90 BASE) MCG/ACT inhaler Inhale 2 puffs into the lungs as needed.     Marland Kitchen atorvastatin (LIPITOR) 80 MG tablet TAKE 1  TABLET BY MOUTH DAILY FOR CHOLESTEROL (Patient taking differently: TAKE 1/2 TABLET BY MOUTH DAILY FOR CHOLESTEROL) 90 tablet 0  . bumetanide (BUMEX) 1 MG tablet Take 1 tablet (1 mg total) by mouth every morning. 90 tablet 2  . calcium carbonate (TUMS EX) 750 MG chewable tablet Chew 1 tablet by mouth daily.    . Cholecalciferol (VITAMIN D3) 2000 UNITS capsule Take 4,000 Units by mouth daily.     . diazepam (VALIUM) 10 MG tablet 1 qam  1  prn 60 tablet 5  . estradiol (ESTRACE) 2 MG tablet TAKE ONE TABLET BY MOUTH DAILY 90 tablet 0  . KLOR-CON M10 10 MEQ tablet     . levothyroxine (SYNTHROID, LEVOTHROID) 75 MCG tablet Take 1 tablet (75 mcg total) by mouth daily before breakfast. 90 tablet 1  . losartan-hydrochlorothiazide (HYZAAR) 100-25 MG per tablet Take 1 tablet by mouth daily. 90 tablet 2  . metoprolol succinate (TOPROL-XL) 50 MG 24 hr tablet Take 1 tablet (50 mg total) by mouth every morning. 30 tablet 6  . omeprazole (PRILOSEC) 20 MG capsule TAKE ONE CAPSULE BY MOUTH EVERY DAY 90 capsule 2  . spironolactone (ALDACTONE) 25 MG tablet TAKE 1 TABLET BY MOUTH EVERY DAY 90 tablet 1  . NUVIGIL 250 MG tablet Take 1 tablet by mouth daily 30 tablet 2   No current facility-administered medications for this visit.    Allergies:   Codeine; Ace inhibitors; Brintellix; Citalopram; Fetzima; Lasix; Nsaids; and Xanax xr    Social History:  The patient  reports that she has never smoked. She has never used smokeless tobacco. She reports that she does not drink alcohol or use illicit drugs.   Family History:  The patient's family history includes Fibromyalgia in her brother; Heart attack in her father; Hypertension in her brother and mother; Pneumonia in her mother; Pulmonary embolism in her brother.    ROS:  Please see the history of present illness.    Otherwise, review of systems positive for none.   All other systems are reviewed and negative.    PHYSICAL EXAM: VS:  BP 136/76 mmHg  Pulse 72  Ht  5\' 3"  (1.6 m)  Wt 173 lb 6.4 oz (78.654 kg)  BMI 30.72 kg/m2 , BMI Body mass index is 30.72 kg/(m^2).  General: Alert, oriented x3, no distress Head: no evidence of trauma, PERRL, EOMI, no exophtalmos or lid lag, no myxedema, no xanthelasma; normal ears, nose and oropharynx Neck: normal jugular venous pulsations and no hepatojugular reflux; brisk carotid pulses without delay and no carotid bruits Chest: clear to auscultation, no signs of consolidation by percussion or palpation, normal fremitus, symmetrical and full respiratory excursions Cardiovascular: normal position and quality of the apical impulse, regular rhythm, normal first and second heart sounds, no murmurs, rubs or gallops Abdomen: no tenderness or distention, no masses by palpation, no abnormal pulsatility or arterial bruits, normal bowel sounds, no hepatosplenomegaly Extremities: no clubbing, cyanosis or edema; 2+ radial, ulnar and brachial  pulses bilaterally; 2+ right femoral, posterior tibial and dorsalis pedis pulses; 2+ left femoral, posterior tibial and dorsalis pedis pulses; no subclavian or femoral bruits Neurological: grossly nonfocal Psych: euthymic mood, full affect   EKG:  EKG is not ordered today.   Recent Labs: 08/23/2014: ALT 16; BUN 19; Creatinine 1.09; Hemoglobin 13.4; Magnesium 2.2; Platelets 308; Potassium 4.5; Sodium 140; TSH 1.874    Lipid Panel    Component Value Date/Time   CHOL 253* 08/23/2014 1623   TRIG 373* 08/23/2014 1623   HDL 50 08/23/2014 1623   CHOLHDL 5.1 08/23/2014 1623   VLDL 75* 08/23/2014 1623   LDLCALC 128* 08/23/2014 1623      Wt Readings from Last 3 Encounters:  10/11/14 173 lb 6.4 oz (78.654 kg)  10/01/14 172 lb 12.8 oz (78.382 kg)  09/05/14 177 lb 12.8 oz (80.65 kg)      ASSESSMENT AND PLAN:  Cardiomyopathy- EF 45-50% by echo 09/26/12 No signs or symptoms of heart failure exacerbation. No evidence of fluid retention. In like a functional class II (her functional class  assessment is difficult since her psychiatric issues cause a lot of confounding symptoms). On appropriate treatment with an angiotensin receptor blocker, beta blocker and aldosterone antagonist.  Severe obstructive sleep apnea Reports compliance with CPAP  Major depressive disorder, apparently in remission Encouraged her to continue following up with her psychiatric professional.  Had labs on Feb 24, will get copy of results.  Current medicines are reviewed at length with the patient today.  The patient does not have concerns regarding medicines.  The following changes have been made:  no change  Labs/ tests ordered today include:  No orders of the defined types were placed in this encounter.    Patient Instructions  Dr Sallyanne Kuster recommends that you schedule a follow-up appointment in 6 months. You will receive a reminder letter in the mail two months in advance. If you don't receive a letter, please call our office to schedule the follow-up appointment.     Mikael Spray, MD  10/13/2014 8:14 PM    Sanda Klein, MD, [facility] [phone] office [phone] pager

## 2014-10-15 ENCOUNTER — Encounter: Payer: Self-pay | Admitting: Internal Medicine

## 2014-10-15 ENCOUNTER — Other Ambulatory Visit: Payer: Self-pay | Admitting: Internal Medicine

## 2014-10-19 ENCOUNTER — Telehealth: Payer: Self-pay | Admitting: Cardiovascular Disease

## 2014-10-19 MED ORDER — ATORVASTATIN CALCIUM 80 MG PO TABS: ORAL_TABLET | ORAL | Status: DC

## 2014-10-19 NOTE — Telephone Encounter (Signed)
Pt is at the pharmacy now and Atorvastatin is not ready. The pharmacist said they had tried several times and it is still not there.

## 2014-10-19 NOTE — Telephone Encounter (Signed)
Called pt, LMOM that I was filling and submitting to pharmacy. [facility] - they verified that she is attempting to get refill there, they have never filled. Requested clarification on dosing.  I submitted erefill and explained dose instructions to pharmacy tech. Understanding verbalized. She will communicate w/ patient as patient is in store waiting.

## 2014-10-27 ENCOUNTER — Other Ambulatory Visit: Payer: Self-pay | Admitting: Internal Medicine

## 2014-11-06 DIAGNOSIS — M810 Age-related osteoporosis without current pathological fracture: Secondary | ICD-10-CM | POA: Diagnosis not present

## 2014-11-19 ENCOUNTER — Other Ambulatory Visit: Payer: Self-pay | Admitting: *Deleted

## 2014-11-19 MED ORDER — POTASSIUM CHLORIDE CRYS ER 10 MEQ PO TBCR: 10.0000 meq | EXTENDED_RELEASE_TABLET | Freq: Two times a day (BID) | ORAL | Status: DC

## 2014-11-30 ENCOUNTER — Other Ambulatory Visit: Payer: Self-pay | Admitting: Internal Medicine

## 2014-12-04 ENCOUNTER — Telehealth: Payer: Self-pay | Admitting: *Deleted

## 2014-12-04 DIAGNOSIS — Z79899 Other long term (current) drug therapy: Secondary | ICD-10-CM

## 2014-12-04 NOTE — Telephone Encounter (Signed)
Lab order mailed to patient to have drawn in 3 months 02/2015 at [facility] lab.

## 2014-12-17 ENCOUNTER — Telehealth: Payer: Self-pay | Admitting: Cardiovascular Disease

## 2014-12-17 NOTE — Telephone Encounter (Signed)
Spoke with patient's daughter and notified her of info per Dr. Loletha Grayer. She voiced understanding and is aware if events recur, to seek eval by PCP

## 2014-12-17 NOTE — Telephone Encounter (Signed)
Returned call to patient's daughter Laura [name] (DPR on file from another [facility] practice).   She reports yesterday patient was sitting on couch and then got up and when to kitchen. She followed her in kitchen and patient had a coke and crackers. She asked what was wrong and patient reported she "felt funny" she reported that when sitting on sofa "everyone didn't look right" and she took her glasses off and this didn't help. She reported to her daughter that her hand and elbow went numb for a while and then it went away. Tammy asked patient if she knew who she was and she was unsure but then answered all follow up questions appropriately. She had crackers and coke and then felt fine. She did not report unilateral weakness, slurred or difficulty speaking, gait issues, facial droop.Tammy, daughter, reports patient does not eat like she is supposed to and has issues with low blood sugar. Patient is eating better today but is fatigued, but daughter reports this is not out of the ordinary.   Daughter Laura [name] is concerned that patient could have had a mini-stroke v. hypoglycemia. Would like Dr. Victorino December advice.   Will route message to MD for review/advice

## 2014-12-17 NOTE — Telephone Encounter (Signed)
Symptoms more consistent with hypoglycemia than with a stroke, not suggestive of a heart problem.

## 2014-12-17 NOTE — Telephone Encounter (Signed)
Tammy would like to speak to a nurse about her mother's current state. She says that yesterday her blood sugar dropped, and she was having some blurred vision along with some numbness in her hand and elbow. Tammy asked that when you return her call , if she does not answer please have her paged.   Thanks

## 2014-12-19 ENCOUNTER — Emergency Department (HOSPITAL_COMMUNITY): Payer: Medicare Other

## 2014-12-19 ENCOUNTER — Emergency Department (HOSPITAL_COMMUNITY)
Admission: EM | Admit: 2014-12-19 | Discharge: 2014-12-19 | Disposition: A | Discharge status: Home / Self Care | Payer: Medicare Other | Attending: Emergency Medicine | Admitting: Emergency Medicine

## 2014-12-19 ENCOUNTER — Encounter (HOSPITAL_COMMUNITY): Payer: Self-pay | Admitting: *Deleted

## 2014-12-19 DIAGNOSIS — I5042 Chronic combined systolic (congestive) and diastolic (congestive) heart failure: Secondary | ICD-10-CM | POA: Insufficient documentation

## 2014-12-19 DIAGNOSIS — G459 Transient cerebral ischemic attack, unspecified: Secondary | ICD-10-CM

## 2014-12-19 DIAGNOSIS — Z79899 Other long term (current) drug therapy: Secondary | ICD-10-CM | POA: Diagnosis not present

## 2014-12-19 DIAGNOSIS — Z862 Personal history of diseases of the blood and blood-forming organs and certain disorders involving the immune mechanism: Secondary | ICD-10-CM | POA: Diagnosis not present

## 2014-12-19 DIAGNOSIS — R410 Disorientation, unspecified: Secondary | ICD-10-CM | POA: Diagnosis not present

## 2014-12-19 DIAGNOSIS — R401 Stupor: Secondary | ICD-10-CM | POA: Diagnosis not present

## 2014-12-19 DIAGNOSIS — Z9889 Other specified postprocedural states: Secondary | ICD-10-CM | POA: Insufficient documentation

## 2014-12-19 DIAGNOSIS — E785 Hyperlipidemia, unspecified: Secondary | ICD-10-CM | POA: Insufficient documentation

## 2014-12-19 DIAGNOSIS — Z7982 Long term (current) use of aspirin: Secondary | ICD-10-CM | POA: Diagnosis not present

## 2014-12-19 DIAGNOSIS — I252 Old myocardial infarction: Secondary | ICD-10-CM | POA: Insufficient documentation

## 2014-12-19 DIAGNOSIS — E039 Hypothyroidism, unspecified: Secondary | ICD-10-CM | POA: Diagnosis not present

## 2014-12-19 DIAGNOSIS — G4733 Obstructive sleep apnea (adult) (pediatric): Secondary | ICD-10-CM | POA: Diagnosis not present

## 2014-12-19 DIAGNOSIS — Z87442 Personal history of urinary calculi: Secondary | ICD-10-CM | POA: Insufficient documentation

## 2014-12-19 DIAGNOSIS — J45909 Unspecified asthma, uncomplicated: Secondary | ICD-10-CM | POA: Diagnosis not present

## 2014-12-19 DIAGNOSIS — R200 Anesthesia of skin: Secondary | ICD-10-CM | POA: Diagnosis not present

## 2014-12-19 DIAGNOSIS — I10 Essential (primary) hypertension: Secondary | ICD-10-CM | POA: Diagnosis not present

## 2014-12-19 DIAGNOSIS — R4182 Altered mental status, unspecified: Secondary | ICD-10-CM | POA: Diagnosis present

## 2014-12-19 LAB — COMPREHENSIVE METABOLIC PANEL
ALT: 21 U/L (ref 14–54)
AST: 21 U/L (ref 15–41)
Albumin: 3.7 g/dL (ref 3.5–5.0)
Alkaline Phosphatase: 69 U/L (ref 38–126)
Anion gap: 7 (ref 5–15)
BUN: 20 mg/dL (ref 6–20)
CALCIUM: 9.5 mg/dL (ref 8.9–10.3)
CO2: 29 mmol/L (ref 22–32)
CREATININE: 1.07 mg/dL — AB (ref 0.44–1.00)
Chloride: 102 mmol/L (ref 101–111)
GFR calc Af Amer: 60 mL/min — ABNORMAL LOW (ref >60)
GFR calc non Af Amer: 52 mL/min — ABNORMAL LOW (ref >60)
Glucose, Bld: 115 mg/dL — ABNORMAL HIGH (ref 65–99)
Potassium: 3.6 mmol/L (ref 3.5–5.1)
Sodium: 138 mmol/L (ref 135–145)
Total Bilirubin: 0.6 mg/dL (ref 0.3–1.2)
Total Protein: 6.7 g/dL (ref 6.5–8.1)

## 2014-12-19 LAB — PROTIME-INR
INR: 1.06 (ref 0.00–1.49)
Prothrombin Time: 14 seconds (ref 11.6–15.2)

## 2014-12-19 LAB — URINALYSIS, ROUTINE W REFLEX MICROSCOPIC
Bilirubin Urine: NEGATIVE
GLUCOSE, UA: NEGATIVE mg/dL
HGB URINE DIPSTICK: NEGATIVE
Ketones, ur: NEGATIVE mg/dL
NITRITE: NEGATIVE
PROTEIN: NEGATIVE mg/dL
Specific Gravity, Urine: 1.019 (ref 1.005–1.030)
Urobilinogen, UA: 0.2 mg/dL (ref 0.0–1.0)
pH: 6 (ref 5.0–8.0)

## 2014-12-19 LAB — DIFFERENTIAL
BASOS ABS: 0 10*3/uL (ref 0.0–0.1)
BASOS PCT: 1 % (ref 0–1)
Eosinophils Absolute: 0.1 10*3/uL (ref 0.0–0.7)
Eosinophils Relative: 1 % (ref 0–5)
LYMPHS ABS: 2.1 10*3/uL (ref 0.7–4.0)
LYMPHS PCT: 26 % (ref 12–46)
MONO ABS: 0.7 10*3/uL (ref 0.1–1.0)
Monocytes Relative: 9 % (ref 3–12)
Neutro Abs: 5 10*3/uL (ref 1.7–7.7)
Neutrophils Relative %: 63 % (ref 43–77)

## 2014-12-19 LAB — RAPID URINE DRUG SCREEN, HOSP PERFORMED
Amphetamines: NOT DETECTED
Barbiturates: NOT DETECTED
Benzodiazepines: POSITIVE — AB
Cocaine: NOT DETECTED
Opiates: NOT DETECTED
TETRAHYDROCANNABINOL: NOT DETECTED

## 2014-12-19 LAB — CBC
HEMATOCRIT: 37.1 % (ref 36.0–46.0)
Hemoglobin: 12 g/dL (ref 12.0–15.0)
MCH: 25.6 pg — ABNORMAL LOW (ref 26.0–34.0)
MCHC: 32.3 g/dL (ref 30.0–36.0)
MCV: 79.3 fL (ref 78.0–100.0)
Platelets: 228 10*3/uL (ref 150–400)
RBC: 4.68 MIL/uL (ref 3.87–5.11)
RDW: 14.3 % (ref 11.5–15.5)
WBC: 8 10*3/uL (ref 4.0–10.5)

## 2014-12-19 LAB — I-STAT CHEM 8, ED
BUN: 23 mg/dL — AB (ref 6–20)
Calcium, Ion: 1.24 mmol/L (ref 1.13–1.30)
Chloride: 98 mmol/L — ABNORMAL LOW (ref 101–111)
Creatinine, Ser: 1.1 mg/dL — ABNORMAL HIGH (ref 0.44–1.00)
GLUCOSE: 112 mg/dL — AB (ref 65–99)
HCT: 38 % (ref 36.0–46.0)
Hemoglobin: 12.9 g/dL (ref 12.0–15.0)
Potassium: 3.5 mmol/L (ref 3.5–5.1)
SODIUM: 138 mmol/L (ref 135–145)
TCO2: 27 mmol/L (ref 0–100)

## 2014-12-19 LAB — I-STAT TROPONIN, ED: TROPONIN I, POC: 0 ng/mL (ref 0.00–0.08)

## 2014-12-19 LAB — URINE MICROSCOPIC-ADD ON

## 2014-12-19 LAB — APTT: aPTT: 28 seconds (ref 24–37)

## 2014-12-19 MED ORDER — ASPIRIN 81 MG PO CHEW: 81.0000 mg | CHEWABLE_TABLET | Freq: Every day | ORAL | Status: DC

## 2014-12-19 NOTE — ED Notes (Signed)
Pt in with family stating that patient has been having episodes of confusion over the last few days, family states they think she is having "mini strokes", pt states she has had trouble following conversations and responding appropriately at times, pt also reports intermittent right arm numbness, during these episodes patient did not know family members and was confused on events, states this did not last very long, pt alert and oriented on arrival, denies symptoms at this time, no deficits noted. Pt denies headache at this time but states she has had one over the last few days.

## 2014-12-19 NOTE — Discharge Instructions (Signed)
We suspect that you had a TIA. Please see Dr. Ardeth Perfect, we have notified him about your visit to the ER. Return to the ER if you have return of your symptoms or any stroke like symptoms (read below).  Ischemic Stroke A stroke (cerebrovascular accident) is the sudden death of brain tissue. It is a medical emergency. A stroke can cause permanent loss of brain function. This can cause problems with different parts of your body. A transient ischemic attack (TIA) is different because it does not cause permanent damage. A TIA is a short-lived problem of poor blood flow affecting a part of the brain. A TIA is also a serious problem because having a TIA greatly increases the chances of having a stroke. When symptoms first develop, you cannot know if the problem might be a stroke or a TIA. CAUSES  A stroke is caused by a decrease of oxygen supply to an area of your brain. It is usually the result of a small blood clot or collection of cholesterol or fat (plaque) that blocks blood flow in the brain. A stroke can also be caused by blocked or damaged carotid arteries.  RISK FACTORS  High blood pressure (hypertension).  High cholesterol.  Diabetes mellitus.  Heart disease.  The buildup of plaque in the blood vessels (peripheral artery disease or atherosclerosis).  The buildup of plaque in the blood vessels providing blood and oxygen to the brain (carotid artery stenosis).  An abnormal heart rhythm (atrial fibrillation).  Obesity.  Smoking.  Taking oral contraceptives (especially in combination with smoking).  Physical inactivity.  A diet high in fats, salt (sodium), and calories.  Alcohol use.  Use of illegal drugs (especially cocaine and methamphetamine).  Being African American.  Being over the age of 44.  Family history of stroke.  Previous history of blood clots, stroke, TIA, or heart attack.  Sickle cell disease. SYMPTOMS  These symptoms usually develop suddenly, or may be  newly present upon awakening from sleep:  Sudden weakness or numbness of the face, arm, or leg, especially on one side of the body.  Sudden trouble walking or difficulty moving arms or legs.  Sudden confusion.  Sudden personality changes.  Trouble speaking (aphasia) or understanding.  Difficulty swallowing.  Sudden trouble seeing in one or both eyes.  Double vision.  Dizziness.  Loss of balance or coordination.  Sudden severe headache with no known cause.  Trouble reading or writing. DIAGNOSIS  Your health care provider can often determine the presence or absence of a stroke based on your symptoms, history, and physical exam. Computed tomography (CT) of the brain is usually performed to confirm the stroke, determine causes, and determine stroke severity. Other tests may be done to find the cause of the stroke. These tests may include:  Electrocardiography.  Continuous heart monitoring.  Echocardiography.  Carotid ultrasonography.  Magnetic resonance imaging (MRI).  A scan of the brain circulation.  Blood tests. PREVENTION  The risk of a stroke can be decreased by appropriately treating high blood pressure, high cholesterol, diabetes, heart disease, and obesity and by quitting smoking, limiting alcohol, and staying physically active. TREATMENT  Time is of the essence. It is important to seek treatment at the first sign of these symptoms because you may receive a medicine to dissolve the clot (thrombolytic) that cannot be given if too much time has passed since your symptoms began. Even if you do not know when your symptoms began, get treatment as soon as possible as there are other  treatment options available including oxygen, intravenous (IV) fluids, and medicines to thin the blood (anticoagulants). Treatment of stroke depends on the duration, severity, and cause of your symptoms. Medicines and dietary changes may be used to address diabetes, high blood pressure, and other  risk factors. Physical, speech, and occupational therapists will assess you and work with you to improve any functions impaired by the stroke. Measures will be taken to prevent short-term and long-term complications, including infection from breathing foreign material into the lungs (aspiration pneumonia), blood clots in the legs, bedsores, and falls. Rarely, surgery may be needed to remove large blood clots or to open up blocked arteries. HOME CARE INSTRUCTIONS   Take medicines only as directed by your health care provider. Follow the directions carefully. Medicines may be used to control risk factors for a stroke. Be sure you understand all your medicine instructions.  You may be told to take a medicine to thin the blood, such as aspirin or the anticoagulant warfarin. Warfarin needs to be taken exactly as instructed.  Too much and too little warfarin are both dangerous. Too much warfarin increases the risk of bleeding. Too little warfarin continues to allow the risk for blood clots. While taking warfarin, you will need to have regular blood tests to measure your blood clotting time. These blood tests usually include both the PT and INR tests. The PT and INR results allow your health care provider to adjust your dose of warfarin. The dose can change for many reasons. It is critically important that you take warfarin exactly as prescribed, and that you have your PT and INR levels drawn exactly as directed.  Many foods, especially foods high in vitamin K, can interfere with warfarin and affect the PT and INR results. Foods high in vitamin K include spinach, kale, broccoli, cabbage, collard and turnip greens, brussels sprouts, peas, cauliflower, seaweed, and parsley, as well as beef and pork liver, green tea, and soybean oil. You should eat a consistent amount of foods high in vitamin K. Avoid major changes in your diet, or notify your health care provider before changing your diet. Arrange a visit with a  dietitian to answer your questions.  Many medicines can interfere with warfarin and affect the PT and INR results. You must tell your health care provider about any and all medicines you take. This includes all vitamins and supplements. Be especially cautious with aspirin and anti-inflammatory medicines. Do not take or discontinue any prescribed or over-the-counter medicine except on the advice of your health care provider or pharmacist.  Warfarin can have side effects, such as excessive bruising or bleeding. You will need to hold pressure over cuts for longer than usual. Your health care provider or pharmacist will discuss other potential side effects.  Avoid sports or activities that may cause injury or bleeding.  Be mindful when shaving, flossing your teeth, or handling sharp objects.  Alcohol can change the body's ability to handle warfarin. It is best to avoid alcoholic drinks or consume only very small amounts while taking warfarin. Notify your health care provider if you change your alcohol intake.  Notify your dentist or other health care providers before procedures.  If swallow studies have determined that your swallowing reflex is present, you should eat healthy foods. Including 5 or more servings of fruits and vegetables a day may reduce the risk of stroke. Foods may need to be a certain consistency (soft or pureed), or small bites may need to be taken in order to avoid  aspirating or choking. Certain dietary changes may be advised to address high blood pressure, high cholesterol, diabetes, or obesity.  Food choices that are low in sodium, saturated fat, trans fat, and cholesterol are recommended to manage high blood pressure.  Food choies that are high in fiber, and low in saturated fat, trans fat, and cholesterol may control cholesterol levels.  Controlling carbohydrates and sugar intake is recommended to manage diabetes.  Reducing calorie intake and making food choices that are low  in sodium, saturated fat, trans fat, and cholesterol are recommended to manage obesity.  Maintain a healthy weight.  Stay physically active. It is recommended that you get at least 30 minutes of activity on all or most days.  Do not use any tobacco products including cigarettes, chewing tobacco, or electronic cigarettes.  Limit alcohol use even if you are not taking warfarin. Moderate alcohol use is considered to be:  No more than 2 drinks each day for men.  No more than 1 drink each day for nonpregnant women.  Home safety. A safe home environment is important to reduce the risk of falls. Your health care provider may arrange for specialists to evaluate your home. Having grab bars in the bedroom and bathroom is often important. Your health care provider may arrange for equipment to be used at home, such as raised toilets and a seat for the shower.  Physical, occupational, and speech therapy. Ongoing therapy may be needed to maximize your recovery after a stroke. If you have been advised to use a walker or a cane, use it at all times. Be sure to keep your therapy appointments.  Follow all instructions for follow-up with your health care provider. This is very important. This includes any referrals, physical therapy, rehabilitation, and lab tests. Proper follow-up can prevent another stroke from occurring. SEEK MEDICAL CARE IF:  You have personality changes.  You have difficulty swallowing.  You are seeing double.  You have dizziness.  You have a fever.  You have skin breakdown. SEEK IMMEDIATE MEDICAL CARE IF:  Any of these symptoms may represent a serious problem that is an emergency. Do not wait to see if the symptoms will go away. Get medical help right away. Call your local emergency services (911 in [place] Do not drive yourself to the hospital.  You have sudden weakness or numbness of the face, arm, or leg, especially on one side of the body.  You have sudden trouble walking  or difficulty moving arms or legs.  You have sudden confusion.  You have trouble speaking (aphasia) or understanding.  You have sudden trouble seeing in one or both eyes.  You have a loss of balance or coordination.  You have a sudden, severe headache with no known cause.  You have new chest pain or an irregular heartbeat.  You have a partial or total loss of consciousness. Document Released: 06/15/2005 Document Revised: 10/30/2013 Document Reviewed: 01/24/2012 [facility] Patient Information 2015 [place], [place]. This information is not intended to replace advice given to you by your health care provider. Make sure you discuss any questions you have with your health care provider. Transient Ischemic Attack A transient ischemic attack (TIA) is a "warning stroke" that causes stroke-like symptoms. Unlike a stroke, a TIA does not cause permanent damage to the brain. The symptoms of a TIA can happen very fast and do not last long. It is important to know the symptoms of a TIA and what to do. This can help prevent a major stroke  or death. CAUSES   A TIA is caused by a temporary blockage in an artery in the brain or neck (carotid artery). The blockage does not allow the brain to get the blood supply it needs and can cause different symptoms. The blockage can be caused by either:  A blood clot.  Fatty buildup (plaque) in a neck or brain artery. RISK FACTORS  High blood pressure (hypertension).  High cholesterol.  Diabetes mellitus.  Heart disease.  The build up of plaque in the blood vessels (peripheral artery disease or atherosclerosis).  The build up of plaque in the blood vessels providing blood and oxygen to the brain (carotid artery stenosis).  An abnormal heart rhythm (atrial fibrillation).  Obesity.  Smoking.  Taking oral contraceptives (especially in combination with smoking).  Physical inactivity.  A diet high in fats, salt (sodium), and calories.  Alcohol  use.  Use of illegal drugs (especially cocaine and methamphetamine).  Being female.  Being African American.  Being over the age of 34.  Family history of stroke.  Previous history of blood clots, stroke, TIA, or heart attack.  Sickle cell disease. SYMPTOMS  TIA symptoms are the same as a stroke but are temporary. These symptoms usually develop suddenly, or may be newly present upon awakening from sleep:  Sudden weakness or numbness of the face, arm, or leg, especially on one side of the body.  Sudden trouble walking or difficulty moving arms or legs.  Sudden confusion.  Sudden personality changes.  Trouble speaking (aphasia) or understanding.  Difficulty swallowing.  Sudden trouble seeing in one or both eyes.  Double vision.  Dizziness.  Loss of balance or coordination.  Sudden severe headache with no known cause.  Trouble reading or writing.  Loss of bowel or bladder control.  Loss of consciousness. DIAGNOSIS  Your caregiver may be able to determine the presence or absence of a TIA based on your symptoms, history, and physical exam. Computed tomography (CT scan) of the brain is usually performed to help identify a TIA. Other tests may be done to diagnose a TIA. These tests may include:  Electrocardiography.  Continuous heart monitoring.  Echocardiography.  Carotid ultrasonography.  Magnetic resonance imaging (MRI).  A scan of the brain circulation.  Blood tests. PREVENTION  The risk of a TIA can be decreased by appropriately treating high blood pressure, high cholesterol, diabetes, heart disease, and obesity and by quitting smoking, limiting alcohol, and staying physically active. TREATMENT  Time is of the essence. Since the symptoms of TIA are the same as a stroke, it is important to seek treatment as soon as possible because you may need a medicine to dissolve the clot (thrombolytic) that cannot be given if too much time has passed. Treatment options  vary. Treatment options may include rest, oxygen, intravenous (IV) fluids, and medicines to thin the blood (anticoagulants). Medicines and diet may be used to address diabetes, high blood pressure, and other risk factors. Measures will be taken to prevent short-term and long-term complications, including infection from breathing foreign material into the lungs (aspiration pneumonia), blood clots in the legs, and falls. Treatment options include procedures to either remove plaque in the carotid arteries or dilate carotid arteries that have narrowed due to plaque. Those procedures are:  Carotid endarterectomy.  Carotid angioplasty and stenting. HOME CARE INSTRUCTIONS   Take all medicines prescribed by your caregiver. Follow the directions carefully. Medicines may be used to control risk factors for a stroke. Be sure you understand all your medicine  instructions.  You may be told to take aspirin or the anticoagulant warfarin. Warfarin needs to be taken exactly as instructed.  Taking too much or too little warfarin is dangerous. Too much warfarin increases the risk of bleeding. Too little warfarin continues to allow the risk for blood clots. While taking warfarin, you will need to have regular blood tests to measure your blood clotting time. A PT blood test measures how long it takes for blood to clot. Your PT is used to calculate another value called an INR. Your PT and INR help your caregiver to adjust your dose of warfarin. The dose can change for many reasons. It is critically important that you take warfarin exactly as prescribed.  Many foods, especially foods high in vitamin K can interfere with warfarin and affect the PT and INR. Foods high in vitamin K include spinach, kale, broccoli, cabbage, collard and turnip greens, brussels sprouts, peas, cauliflower, seaweed, and parsley as well as beef and pork liver, green tea, and soybean oil. You should eat a consistent amount of foods high in vitamin K.  Avoid major changes in your diet, or notify your caregiver before changing your diet. Arrange a visit with a dietitian to answer your questions.  Many medicines can interfere with warfarin and affect the PT and INR. You must tell your caregiver about any and all medicines you take, this includes all vitamins and supplements. Be especially cautious with aspirin and anti-inflammatory medicines. Do not take or discontinue any prescribed or over-the-counter medicine except on the advice of your caregiver or pharmacist.  Warfarin can have side effects, such as excessive bruising or bleeding. You will need to hold pressure over cuts for longer than usual. Your caregiver or pharmacist will discuss other potential side effects.  Avoid sports or activities that may cause injury or bleeding.  Be mindful when shaving, flossing your teeth, or handling sharp objects.  Alcohol can change the body's ability to handle warfarin. It is best to avoid alcoholic drinks or consume only very small amounts while taking warfarin. Notify your caregiver if you change your alcohol intake.  Notify your dentist or other caregivers before procedures.  Eat a diet that includes 5 or more servings of fruits and vegetables each day. This may reduce the risk of stroke. Certain diets may be prescribed to address high blood pressure, high cholesterol, diabetes, or obesity.  A low-sodium, low-saturated fat, low-trans fat, low-cholesterol diet is recommended to manage high blood pressure.  A low-saturated fat, low-trans fat, low-cholesterol, and high-fiber diet may control cholesterol levels.  A controlled-carbohydrate, controlled-sugar diet is recommended to manage diabetes.  A reduced-calorie, low-sodium, low-saturated fat, low-trans fat, low-cholesterol diet is recommended to manage obesity.  Maintain a healthy weight.  Stay physically active. It is recommended that you get at least 30 minutes of activity on most or all  days.  Do not smoke.  Limit alcohol use even if you are not taking warfarin. Moderate alcohol use is considered to be:  No more than 2 drinks each day for men.  No more than 1 drink each day for nonpregnant women.  Stop drug abuse.  Home safety. A safe home environment is important to reduce the risk of falls. Your caregiver may arrange for specialists to evaluate your home. Having grab bars in the bedroom and bathroom is often important. Your caregiver may arrange for equipment to be used at home, such as raised toilets and a seat for the shower.  Follow all instructions for follow-up  with your caregiver. This is very important. This includes any referrals and lab tests. Proper follow up can prevent a stroke or another TIA from occurring. SEEK MEDICAL CARE IF:  You have personality changes.  You have difficulty swallowing.  You are seeing double.  You have dizziness.  You have a fever.  You have skin breakdown. SEEK IMMEDIATE MEDICAL CARE IF:  Any of these symptoms may represent a serious problem that is an emergency. Do not wait to see if the symptoms will go away. Get medical help right away. Call your local emergency services (911 in [place] Do not drive yourself to the hospital.  You have sudden weakness or numbness of the face, arm, or leg, especially on one side of the body.  You have sudden trouble walking or difficulty moving arms or legs.  You have sudden confusion.  You have trouble speaking (aphasia) or understanding.  You have sudden trouble seeing in one or both eyes.  You have a loss of balance or coordination.  You have a sudden, severe headache with no known cause.  You have new chest pain or an irregular heartbeat.  You have a partial or total loss of consciousness. MAKE SURE YOU:   Understand these instructions.  Will watch your condition.  Will get help right away if you are not doing well or get worse. Document Released: 03/25/2005 Document  Revised: 06/20/2013 Document Reviewed: 09/20/2013 [facility] Patient Information 2015 [place], [place]. This information is not intended to replace advice given to you by your health care provider. Make sure you discuss any questions you have with your health care provider.

## 2014-12-19 NOTE — ED Notes (Signed)
Pt undressed, in gown, on monitor, continuous pulse oximetry and blood pressure cuff; visitor at bedside 

## 2014-12-20 DIAGNOSIS — I2510 Atherosclerotic heart disease of native coronary artery without angina pectoris: Secondary | ICD-10-CM | POA: Diagnosis not present

## 2014-12-20 DIAGNOSIS — G459 Transient cerebral ischemic attack, unspecified: Secondary | ICD-10-CM | POA: Diagnosis not present

## 2014-12-20 DIAGNOSIS — Z6830 Body mass index (BMI) 30.0-30.9, adult: Secondary | ICD-10-CM | POA: Diagnosis not present

## 2014-12-20 DIAGNOSIS — E785 Hyperlipidemia, unspecified: Secondary | ICD-10-CM | POA: Diagnosis not present

## 2014-12-20 NOTE — ED Provider Notes (Signed)
CSN: [id number]     Arrival date & time 12/19/14  1045 History   First MD Initiated Contact with Patient 12/19/14 1140     Chief Complaint  Patient presents with  . Altered Mental Status     (Consider location/radiation/quality/duration/timing/severity/associated sxs/prior Treatment) HPI Comments: Pt comes in with cc of AMS. Pt has hx of HTN, OSA, CHF, CAD. She has no hx of strokes. Reports that in the last 24 hours she has had 2 episodes where she was confused and not speaking coherently. Pt also had right sided numbness with those events. The 2 episodes lasted for 15 minutes or so. There is no headaches, falls, weakness.   ROS 10 Systems reviewed and are negative for acute change except as noted in the HPI.     Patient is a 69 y.o. female presenting with altered mental status. The history is provided by the patient.  Altered Mental Status Associated symptoms: no abdominal pain, no headaches, no nausea and no vomiting     Past Medical History  Diagnosis Date  . Hypothyroidism   . Anemia   . Seasonal allergies   . Depression   . SUI (stress urinary incontinence, female)   . OSA (obstructive sleep apnea) MODERATE PER STUDY 2005    CPAP NONCOMPLIANT  . History of kidney stones   . Nonischemic dilated cardiomyopathy     MODERATELY DEPRESSED LVF;EF 35-45% by Echo 05/27/11  . LBBB (left bundle branch block)   . Left ventricular ejection fraction less than 40%     38% PER CARDIOLOGIST NOTE (DR CROITORU)  . Short of breath on exertion   . Chronic combined systolic and diastolic CHF, NYHA class 2 CARDIOLOGIST-  DR NFAOZHYQ  . Hypertension   . History of non-ST elevation myocardial infarction (NSTEMI) JUNE 2011    SECONDARY TO TAKOTSUDO SYNDROME (CARDIAC CATH NORMAL)  . Hyperlipemia   . Anxiety   . Asthma     related to sesonal allergies   Past Surgical History  Procedure Laterality Date  . Nasal septum surgery  1980's  . Abdominal hysterectomy  1985    partial  .  Cataract extraction w/ intraocular lens  implant, bilateral    . Right ureteroscopic stone extraction  08-31-2000  . Knee arthroscopy w/ meniscectomy  07-27-2011    MEDIAL AND LATERAL  . Transthoracic echocardiogram  05-27-2011  DR CROITORU    MODERATELY DEPRESSED LVF DUE TO GLOBAL HYPOKINESIS AND MARKED SYSTOLIC ASYNCHRONY/ EF 65%/ MILD LEFT ATRIAL DILATATION  . Cardiac catheterization  09-04-1999;  08/25/2004;   12/09/2009  DR CROITORU    NORMAL CORONARIES/  APICAL BALLOONING OF LV CONSISTENT WITH TAKOTSUBO SYMPTOMS/ EF 30-35%  . Pubovaginal sling N/A 09/19/2012    Procedure: SUBURETHRAL Janyth Pupa;  Surgeon: Bernestine Amass, MD;  Location: [facility];  Service: Urology;  Laterality: N/A;  . Cystoscopy N/A 09/19/2012    Procedure: CYSTOSCOPY FLEXIBLE;  Surgeon: Bernestine Amass, MD;  Location: [facility];  Service: Urology;  Laterality: N/A;  . Cholecystectomy N/A 09/29/2012    Procedure: LAPAROSCOPIC CHOLECYSTECTOMY WITH INTRAOPERATIVE CHOLANGIOGRAM;  Surgeon: Adin Hector, MD;  Location: [facility];  Service: General;  Laterality: N/A;   Family History  Problem Relation Age of Onset  . Pneumonia Mother   . Hypertension Mother   . Heart attack Father   . Fibromyalgia Brother   . Pulmonary embolism Brother   . Hypertension Brother    History  Substance Use Topics  . Smoking status:  Never Smoker   . Smokeless tobacco: Never Used  . Alcohol Use: No   OB History    No data available     Review of Systems  Constitutional: Negative for activity change.  Respiratory: Negative for shortness of breath.   Cardiovascular: Negative for chest pain.  Gastrointestinal: Negative for nausea, vomiting and abdominal pain.  Genitourinary: Negative for dysuria.  Musculoskeletal: Negative for neck pain.  Neurological: Negative for headaches.      Allergies  Codeine; Ace inhibitors; Brintellix; Citalopram; Fetzima; Lasix; Nsaids; and Xanax xr  Home Medications    Prior to Admission medications   Medication Sig Start Date End Date Taking? Authorizing Provider  acetaminophen (TYLENOL) 325 MG tablet Take 162.5 mg by mouth every 6 (six) hours as needed for pain (for headache.).   Yes Historical Provider, MD  albuterol (PROVENTIL HFA;VENTOLIN HFA) 108 (90 BASE) MCG/ACT inhaler Inhale 2 puffs into the lungs as needed.    Yes Historical Provider, MD  atorvastatin (LIPITOR) 80 MG tablet TAKE 1/2 TABLET BY MOUTH DAILY FOR CHOLESTEROL 10/19/14  Yes Mihai Croitoru, MD  bumetanide (BUMEX) 1 MG tablet Take 1 tablet (1 mg total) by mouth every morning. 03/27/14  Yes Unk Pinto, MD  calcium carbonate (TUMS EX) 750 MG chewable tablet Chew 1 tablet by mouth daily.   Yes Historical Provider, MD  Cholecalciferol (VITAMIN D3) 2000 UNITS capsule Take 4,000 Units by mouth daily.    Yes Historical Provider, MD  diazepam (VALIUM) 10 MG tablet 1 qam  1  prn Patient taking differently: Take 10 mg by mouth 2 (two) times daily. 1 qam  1  prn 09/05/14  Yes Norma Fredrickson, MD  escitalopram (LEXAPRO) 20 MG tablet Take 20 mg by mouth daily.   Yes Historical Provider, MD  levothyroxine (SYNTHROID, LEVOTHROID) 75 MCG tablet TAKE 1 TABLET BY MOUTH EVERY DAY 10/15/14  Yes Unk Pinto, MD  losartan-hydrochlorothiazide (HYZAAR) 100-25 MG per tablet Take 1 tablet by mouth daily. 03/13/14  Yes Mihai Croitoru, MD  metoprolol succinate (TOPROL-XL) 50 MG 24 hr tablet Take 1 tablet (50 mg total) by mouth every morning. 09/17/14  Yes Mihai Croitoru, MD  NUVIGIL 250 MG tablet Take 1 tablet by mouth daily 06/15/14 12/19/14 Yes Unk Pinto, MD  omeprazole (PRILOSEC) 20 MG capsule TAKE ONE CAPSULE BY MOUTH EVERY DAY   Yes Melissa Smith, PA-C  potassium chloride (KLOR-CON M10) 10 MEQ tablet Take 1 tablet (10 mEq total) by mouth 2 (two) times daily. 11/19/14  Yes Mihai Croitoru, MD  spironolactone (ALDACTONE) 25 MG tablet TAKE 1 TABLET BY MOUTH EVERY DAY 07/09/14  Yes Unk Pinto, MD  aspirin 81  MG chewable tablet Chew 1 tablet (81 mg total) by mouth daily. 12/19/14   Varney Biles, MD  estradiol (ESTRACE) 2 MG tablet TAKE ONE TABLET BY MOUTH DAILY Patient not taking: Reported on 12/19/2014 08/16/13   Unk Pinto, MD   BP 104/47 mmHg  Pulse 57  Temp(Src) 98.1 F (36.7 C) (Oral)  Resp 17  Ht '5\' 3"'$  (1.6 m)  Wt 172 lb (78.019 kg)  BMI 30.48 kg/m2  SpO2 98% Physical Exam  Constitutional: She is oriented to person, place, and time. She appears well-developed.  HENT:  Head: Normocephalic and atraumatic.  Eyes: Conjunctivae and EOM are normal. Pupils are equal, round, and reactive to light.  Neck: Normal range of motion. Neck supple.  Cardiovascular: Normal rate, regular rhythm and normal heart sounds.   Pulmonary/Chest: Effort normal and breath sounds normal. No respiratory distress.  Abdominal: Soft. Bowel sounds are normal. She exhibits no distension. There is no tenderness. There is no rebound and no guarding.  Neurological: She is alert and oriented to person, place, and time. No cranial nerve deficit. Coordination normal.  Skin: Skin is warm and dry.  Nursing note and vitals reviewed.   ED Course  Procedures (including critical care time) Labs Review Labs Reviewed  CBC - Abnormal; Notable for the following:    MCH 25.6 (*)    All other components within normal limits  COMPREHENSIVE METABOLIC PANEL - Abnormal; Notable for the following:    Glucose, Bld 115 (*)    Creatinine, Ser 1.07 (*)    GFR calc non Af Amer 52 (*)    GFR calc Af Amer 60 (*)    All other components within normal limits  URINE RAPID DRUG SCREEN, HOSP PERFORMED - Abnormal; Notable for the following:    Benzodiazepines POSITIVE (*)    All other components within normal limits  URINALYSIS, ROUTINE W REFLEX MICROSCOPIC (NOT AT [facility]) - Abnormal; Notable for the following:    Leukocytes, UA SMALL (*)    All other components within normal limits  I-STAT CHEM 8, ED - Abnormal; Notable for the  following:    Chloride 98 (*)    BUN 23 (*)    Creatinine, Ser 1.10 (*)    Glucose, Bld 112 (*)    All other components within normal limits  PROTIME-INR  APTT  DIFFERENTIAL  URINE MICROSCOPIC-ADD ON  Randolm Idol, ED    Imaging Review Ct Head Wo Contrast  12/19/2014   CLINICAL DATA:  Confusion for past several days. Intermittent right arm numbness.  EXAM: CT HEAD WITHOUT CONTRAST  TECHNIQUE: Contiguous axial images were obtained from the base of the skull through the vertex without intravenous contrast.  COMPARISON:  September 12, 2012  FINDINGS: The ventricles are normal in size and configuration. There is no intracranial mass, hemorrhage, extra-axial fluid collection, or midline shift. Gray-white compartments are normal. No acute infarct evident. The bony calvarium appears intact. The mastoid air cells are clear.  IMPRESSION: Study within normal limits. No intracranial mass, hemorrhage, or focal gray - white compartment lesions/acute appearing infarct.   Electronically Signed   By: Lowella Grip III M.D.   On: 12/19/2014 14:42     EKG Interpretation   Date/Time:  Wednesday December 19 2014 10:55:00 EDT Ventricular Rate:  74 PR Interval:  165 QRS Duration: 156 QT Interval:  439 QTC Calculation: 487 R Axis:   2 Text Interpretation:  Sinus rhythm Left bundle branch block - old lead 1,  2 T eave inversions more pronounced compared to before new changes as  above Confirmed by Kathrynn Humble, MD, Thelma Comp [phone]) on 12/19/2014 11:32:40 AM      MDM   Final diagnoses:  Confusion  Transient cerebral ischemia, unspecified transient cerebral ischemia type    Pt with 2 episodes of confusion and right sided numbness. ? TIA. Neuro exam - normal currently. ABCD2 score is 4. Pt had 2 episodes, and so we asked her if she would like to stay in the ER and get the workup done, or prefer outpatient management. The ABCD2 score of 4 - puts patient at moderate risk for stroke within the next week and  month. She prefers outpatient management, and i have called her PCP's office to set an appointment soon.     Varney Biles, MD 12/20/14 1052

## 2015-01-16 ENCOUNTER — Ambulatory Visit (INDEPENDENT_AMBULATORY_CARE_PROVIDER_SITE_OTHER): Payer: Medicare Other | Admitting: Psychiatry

## 2015-01-16 VITALS — BP 99/63 | HR 84 | Temp 98.0°F | Resp 18 | Ht 63.0 in | Wt 170.0 lb

## 2015-01-16 DIAGNOSIS — F332 Major depressive disorder, recurrent severe without psychotic features: Secondary | ICD-10-CM

## 2015-01-16 DIAGNOSIS — F331 Major depressive disorder, recurrent, moderate: Secondary | ICD-10-CM

## 2015-01-16 MED ORDER — ESCITALOPRAM OXALATE 20 MG PO TABS: 20.0000 mg | ORAL_TABLET | Freq: Every day | ORAL | Status: DC

## 2015-01-16 MED ORDER — DIAZEPAM 10 MG PO TABS: 10.0000 mg | ORAL_TABLET | Freq: Two times a day (BID) | ORAL | Status: DC

## 2015-01-16 NOTE — Progress Notes (Signed)
[facility] MD Progress Note  01/16/2015 3:05 PM Laura [name]  MRN:  [id number] Subjective: Baseline miserable Today the patient says she is no better. It seems a major problem is her problem with sleep. He used to see Dr. Clotilde Dieter who treated her sleep apnea now she sees an orthodontist who is making a structure compliance for her mouth. The patient says that she actually sleeps a normal amount of hours but sometimes has trouble falling asleep and often has problems staying asleep. It's erratic but it does produce sleep dysfunction during the day. She takes dinner cars Nuvigil to get up. Her energy level is obviously low. The patient says she has no appetite. She did prescribes anhedonia. She can think and concentrate fairly well and she would not describe herself as being worthless. She is not suicidal now and never has been. Her anhedonic state is important to consider in this patient. She meets the criteria of generalized anxiety disorder in many ways. She is oriented to undergo she's chronically worried about everything to the point where wakes her up at night. Her sleep is obviously disturbed. She doesn't describe muscle skeletal tension directly but she is very very irritable. The patient describes her self-esteem but he could explode easily. She claims 10 mg of Valium 2 of them at night help contain that. She doesn't believe Lexapro is made any difference. She gets her Nuvigil from her primary care doctor. She demonstrates no psychosis. Her husband is not well he recently had a cardiac stent and placed. Note is the patient is never been on Neurontin. She's been on multiple psychiatric medications in the past. Principal Problem: Major depression, recurrent episode severe Diagnosis:  Major depression, recurrent episode severe Patient Active Problem List   Diagnosis Date Noted  . Mixed hyperlipidemia [E78.2] 08/23/2014  . Major depressive disorder, recurrent episode, severe [F33.2] 09/22/2013  . Vitamin D  Deficiency [E55.9] 07/28/2013  . Medication management [Z79.899] 07/28/2013  . Prediabetes [R73.09] 07/28/2013  . Obstructive sleep apnea [G47.33] 05/27/2013  . Arrhythmia [I49.9] 04/14/2013  . Palpitations [R00.2] 03/12/2013  . Delayed gastric emptying [K30] 10/18/2012  . Steatohepatitis, nonalcoholic [W09.81] 19/14/7829  . Female stress incontinence [N39.3] 09/19/2012  . Takotsubo syndrome, June 2011.(normal coronaries) [I51.81] 09/12/2012  . Cardiomyopathy- EF 45-50% by echo 09/26/12 [I42.9] 09/12/2012  . Anxiety disorder  [F41.9] 09/12/2012  . HTN (hypertension) [I10] 09/12/2012  . LBBB (left bundle branch block) [I44.7] 09/12/2012  . Obesity [E66.9] 09/12/2012  . Sleep apnea- non compliant with C-pap [G47.30] 09/12/2012   Total Time spent with patient: 30 minutes   Past Medical History:  Past Medical History  Diagnosis Date  . Hypothyroidism   . Anemia   . Seasonal allergies   . Depression   . SUI (stress urinary incontinence, female)   . OSA (obstructive sleep apnea) MODERATE PER STUDY 2005    CPAP NONCOMPLIANT  . History of kidney stones   . Nonischemic dilated cardiomyopathy     MODERATELY DEPRESSED LVF;EF 35-45% by Echo 05/27/11  . LBBB (left bundle branch block)   . Left ventricular ejection fraction less than 40%     38% PER CARDIOLOGIST NOTE (DR CROITORU)  . Short of breath on exertion   . Chronic combined systolic and diastolic CHF, NYHA class 2 CARDIOLOGIST-  DR FAOZHYQM  . Hypertension   . History of non-ST elevation myocardial infarction (NSTEMI) JUNE 2011    SECONDARY TO TAKOTSUDO SYNDROME (CARDIAC CATH NORMAL)  . Hyperlipemia   . Anxiety   .  Asthma     related to sesonal allergies    Past Surgical History  Procedure Laterality Date  . Nasal septum surgery  1980's  . Abdominal hysterectomy  1985    partial  . Cataract extraction w/ intraocular lens  implant, bilateral    . Right ureteroscopic stone extraction  08-31-2000  . Knee arthroscopy w/  meniscectomy  07-27-2011    MEDIAL AND LATERAL  . Transthoracic echocardiogram  05-27-2011  DR CROITORU    MODERATELY DEPRESSED LVF DUE TO GLOBAL HYPOKINESIS AND MARKED SYSTOLIC ASYNCHRONY/ EF 24%/ MILD LEFT ATRIAL DILATATION  . Cardiac catheterization  09-04-1999;  08/25/2004;   12/09/2009  DR CROITORU    NORMAL CORONARIES/  APICAL BALLOONING OF LV CONSISTENT WITH TAKOTSUBO SYMPTOMS/ EF 30-35%  . Pubovaginal sling N/A 09/19/2012    Procedure: SUBURETHRAL Janyth Pupa;  Surgeon: Bernestine Amass, MD;  Location: [facility];  Service: Urology;  Laterality: N/A;  . Cystoscopy N/A 09/19/2012    Procedure: CYSTOSCOPY FLEXIBLE;  Surgeon: Bernestine Amass, MD;  Location: [facility];  Service: Urology;  Laterality: N/A;  . Cholecystectomy N/A 09/29/2012    Procedure: LAPAROSCOPIC CHOLECYSTECTOMY WITH INTRAOPERATIVE CHOLANGIOGRAM;  Surgeon: Adin Hector, MD;  Location: [facility];  Service: General;  Laterality: N/A;   Family History:  Family History  Problem Relation Age of Onset  . Pneumonia Mother   . Hypertension Mother   . Heart attack Father   . Fibromyalgia Brother   . Pulmonary embolism Brother   . Hypertension Brother    Social History:  History  Alcohol Use No     History  Drug Use No    History   Social History  . Marital Status: Married    Spouse Name: N/A  . Number of Children: N/A  . Years of Education: N/A   Social History Main Topics  . Smoking status: Never Smoker   . Smokeless tobacco: Never Used  . Alcohol Use: No  . Drug Use: No  . Sexual Activity: No   Other Topics Concern  . Not on file   Social History Narrative   Additional History:    Sleep: Poor  Appetite:  Poor   Assessment:   Musculoskeletal: Strength & Muscle Tone: within normal limits Gait & Station: normal Patient leans: Right   Psychiatric Specialty Exam: Physical Exam  ROS  Blood pressure 99/63, pulse 84, temperature 98 F (36.7 C), resp. rate 18, height 5'  3" (1.6 m), weight 170 lb (77.111 kg), SpO2 100 %.Body mass index is 30.12 kg/(m^2).  General Appearance: Casual  Eye Contact::  Good  Speech:  Normal Rate  Volume:  Normal  Mood:  Angry  Affect:  Non-Congruent and Constricted  Thought Process:  Coherent  Orientation:  Full (Time, Place, and Person)  Thought Content:  WDL  Suicidal Thoughts:  No  Homicidal Thoughts:  No  Memory:  NA  Judgement:  Good  Insight:  Fair  Psychomotor Activity:  Normal  Concentration:  Fair  Recall:  Good  Fund of Knowledge:Fair  Language: Good  Akathisia:  No  Handed:  Right  AIMS (if indicated):     Assets:  Desire for Improvement  ADL's:  Intact  Cognition: WNL  Sleep:        Current Medications: Current Outpatient Prescriptions  Medication Sig Dispense Refill  . acetaminophen (TYLENOL) 325 MG tablet Take 162.5 mg by mouth every 6 (six) hours as needed for pain (for headache.).    Marland Kitchen  albuterol (PROVENTIL HFA;VENTOLIN HFA) 108 (90 BASE) MCG/ACT inhaler Inhale 2 puffs into the lungs as needed.     Marland Kitchen aspirin 81 MG chewable tablet Chew 1 tablet (81 mg total) by mouth daily. 30 tablet 0  . atorvastatin (LIPITOR) 80 MG tablet TAKE 1/2 TABLET BY MOUTH DAILY FOR CHOLESTEROL 45 tablet 3  . bumetanide (BUMEX) 1 MG tablet Take 1 tablet (1 mg total) by mouth every morning. 90 tablet 2  . calcium carbonate (TUMS EX) 750 MG chewable tablet Chew 1 tablet by mouth daily.    . Cholecalciferol (VITAMIN D3) 2000 UNITS capsule Take 4,000 Units by mouth daily.     . diazepam (VALIUM) 10 MG tablet Take 1 tablet (10 mg total) by mouth 2 (two) times daily. 1 qam  1  prn 60 tablet 5  . escitalopram (LEXAPRO) 20 MG tablet Take 1 tablet (20 mg total) by mouth daily. 20 tablet 5  . estradiol (ESTRACE) 2 MG tablet TAKE ONE TABLET BY MOUTH DAILY (Patient not taking: Reported on 12/19/2014) 90 tablet 0  . levothyroxine (SYNTHROID, LEVOTHROID) 75 MCG tablet TAKE 1 TABLET BY MOUTH EVERY DAY 90 tablet 0  .  losartan-hydrochlorothiazide (HYZAAR) 100-25 MG per tablet Take 1 tablet by mouth daily. 90 tablet 2  . metoprolol succinate (TOPROL-XL) 50 MG 24 hr tablet Take 1 tablet (50 mg total) by mouth every morning. 30 tablet 6  . NUVIGIL 250 MG tablet Take 1 tablet by mouth daily 30 tablet 2  . omeprazole (PRILOSEC) 20 MG capsule TAKE ONE CAPSULE BY MOUTH EVERY DAY 90 capsule 2  . potassium chloride (KLOR-CON M10) 10 MEQ tablet Take 1 tablet (10 mEq total) by mouth 2 (two) times daily. 180 tablet 3  . spironolactone (ALDACTONE) 25 MG tablet TAKE 1 TABLET BY MOUTH EVERY DAY 90 tablet 1   No current facility-administered medications for this visit.    Lab Results: No results found for this or any previous visit (from the past 48 hour(s)).  Physical Findings: AIMS:  , ,  ,  ,    CIWA:    COWS:     Treatment Plan Summary: At this time the patient will come off Lexapro. She go to 10 mg for 6 days and then stop it. The patient continue taking Valium 10 mg 2 at night. I've asked the patient to please consider bringing her husband with her. She is hesitant about doing that. When her next visit we would still consider the possibility using Neurontin for anxiety. We'll also reevaluate the possibility of an antidepression perhaps that of Remeron which would help her sleep and appetite. We'll review all her last antidepression's that she's been on. The patient is not suicidal but I am concerned that she is anhedonic and persistently miserable. I do not think she is violent nor to I think she is homicidal and she certainly is not suicidal.   Medical Decision Making:  New problem, with additional work up planned     [place], [place] 01/16/2015, 3:05 PM

## 2015-01-17 DIAGNOSIS — G459 Transient cerebral ischemic attack, unspecified: Secondary | ICD-10-CM | POA: Diagnosis not present

## 2015-01-21 ENCOUNTER — Telehealth: Payer: Self-pay | Admitting: Cardiovascular Disease

## 2015-01-21 NOTE — Telephone Encounter (Signed)
When speaking with the pt on the phone she stated that recently she has been experiencing some episodes to where she starts shaking and she get cold all over. She says that she has had some scans done and nothing has been found. She says that she would like to come in and be checked out by Dr.C. I informed her that Dr.C will not be back in the office until August and even then his next available is in October. She was not happy with that so I offered her an appt with Lurena Joiner on 7/26. She voiced she did not want to see a PA.  I scheduled her appt with Dr.C in October and she replied with "thanks for nothing" and terminated the call. If there is another option for her please call  Thanks

## 2015-01-21 NOTE — Telephone Encounter (Signed)
Outgoing call to patient. Called to inquire on nature of her problem. Pt was very anxious on phone, very upset, stating concerns about calling us and being set up for 6 month f/u and not being able to be worked in sooner - also upset about medication refills not going through correctly. She did not elicit any information on the medical problem she had. After lengthy conversation, did find out she has set up appt to see PCP next Tuesday. At several points, patient expressed that if she was not satisfied with care received from [place] she would seek care under other cardiologist. Attempted mainly to offer reassurance. Appears that October appt is on schedule w/ recommended return for 6 mo f/u from April.   Regarding acute concern, since she is already set up to see PCP soon and not listing any specific cardiac complaints,  I asked pt to f/u or have PCP f/u next week if they feel her complaint requires evaluation by Cardiology. Was able to calm patient, reinforce reasonable expectations of care.  Pt amenable to this plan, will contact us going forward for new problems.

## 2015-01-29 DIAGNOSIS — F8089 Other developmental disorders of speech and language: Secondary | ICD-10-CM | POA: Diagnosis not present

## 2015-01-29 DIAGNOSIS — R6889 Other general symptoms and signs: Secondary | ICD-10-CM | POA: Diagnosis not present

## 2015-01-29 DIAGNOSIS — R300 Dysuria: Secondary | ICD-10-CM | POA: Diagnosis not present

## 2015-01-29 DIAGNOSIS — Z6830 Body mass index (BMI) 30.0-30.9, adult: Secondary | ICD-10-CM | POA: Diagnosis not present

## 2015-01-29 DIAGNOSIS — R252 Cramp and spasm: Secondary | ICD-10-CM | POA: Diagnosis not present

## 2015-01-29 DIAGNOSIS — R5383 Other fatigue: Secondary | ICD-10-CM | POA: Diagnosis not present

## 2015-01-29 DIAGNOSIS — R251 Tremor, unspecified: Secondary | ICD-10-CM | POA: Diagnosis not present

## 2015-01-29 DIAGNOSIS — N390 Urinary tract infection, site not specified: Secondary | ICD-10-CM | POA: Diagnosis not present

## 2015-01-29 DIAGNOSIS — R8290 Unspecified abnormal findings in urine: Secondary | ICD-10-CM | POA: Diagnosis not present

## 2015-02-01 DIAGNOSIS — G3184 Mild cognitive impairment, so stated: Secondary | ICD-10-CM | POA: Diagnosis not present

## 2015-02-02 DIAGNOSIS — A499 Bacterial infection, unspecified: Secondary | ICD-10-CM | POA: Diagnosis not present

## 2015-02-02 DIAGNOSIS — N390 Urinary tract infection, site not specified: Secondary | ICD-10-CM | POA: Diagnosis not present

## 2015-02-08 ENCOUNTER — Encounter: Payer: Self-pay | Admitting: Cardiovascular Disease

## 2015-02-08 ENCOUNTER — Ambulatory Visit (INDEPENDENT_AMBULATORY_CARE_PROVIDER_SITE_OTHER): Payer: Medicare Other | Admitting: Cardiovascular Disease

## 2015-02-08 VITALS — BP 98/74 | HR 84 | Resp 16 | Ht 63.0 in | Wt 169.7 lb

## 2015-02-08 DIAGNOSIS — G4730 Sleep apnea, unspecified: Secondary | ICD-10-CM | POA: Diagnosis not present

## 2015-02-08 DIAGNOSIS — E669 Obesity, unspecified: Secondary | ICD-10-CM

## 2015-02-08 DIAGNOSIS — K7581 Nonalcoholic steatohepatitis (NASH): Secondary | ICD-10-CM

## 2015-02-08 DIAGNOSIS — E782 Mixed hyperlipidemia: Secondary | ICD-10-CM | POA: Diagnosis not present

## 2015-02-08 DIAGNOSIS — R002 Palpitations: Secondary | ICD-10-CM

## 2015-02-08 DIAGNOSIS — I447 Left bundle-branch block, unspecified: Secondary | ICD-10-CM

## 2015-02-08 DIAGNOSIS — I429 Cardiomyopathy, unspecified: Secondary | ICD-10-CM

## 2015-02-08 DIAGNOSIS — G4733 Obstructive sleep apnea (adult) (pediatric): Secondary | ICD-10-CM

## 2015-02-08 MED ORDER — LOSARTAN POTASSIUM 100 MG PO TABS
100.0000 mg | ORAL_TABLET | Freq: Every day | ORAL | Status: DC
Start: 2015-02-08 — End: 2015-04-24

## 2015-02-08 NOTE — Progress Notes (Signed)
Patient ID: Laura [name], female   DOB: 06/04/1946, 69 y.o.   MRN: [id number]     Cardiology Office Note   Date:  02/10/2015   ID:  Laura [name], DOB [birth year], MRN [id number]  PCP:  Velna Hatchet, MD  Cardiologist:   Sanda Klein, MD   Chief Complaint  Patient presents with  . 6 MONTHS    PCP would like to put her on a blood thinner due to possible stroke.  Patient wants to discuss with Dr. Loletha Grayer before she makes a final decision.      History of Present Illness: Laura [name] is a 69 y.o. female who presents for  Follow-up for nonischemic cardiomyopathy. On one occasion she appears to have stress cardiomyopathy (takotsubo syndrome),  But cardiomyopathy recurred couple of years later. She has a chronic left bundle branch block. Treatment for heart failure has led to complete normalization of left ventricular ejection fraction.  I suspect that at least in part to her cardiomyopathy is related to intermittent compliance with treatment of hypertension. She has not had congestive heart failure in a long time.   her major issues today are related to recent neurological symptoms and the recommendation from her primary care provider to start clopidogrel. She does not want to take this medication.  As on previous occasions, Claudett also has multiple other somatic complaints , that she describes with vivid detail, but also with a rather distracted and scattered rendition.   on 12/19/2014 she had 2 episodes of garbled speech ("I was talking [place]") and facial numbness.  Each episode resolved spontaneously after 20 minutes. After one spell she had an incredibly bad headache which she described as her eyes being pulled out of the back of her head. In the emergency room her neurological exam was normal. Her evaluation included a CT scan that showed no acute findings. Subsequently she has undergone a cardiac MRI that shows several very small white matter foci that could represent small ischemic  strokes (reported as nonspecific by radiologist).  Additional workup has included normal bilateral carotid Dopplers and an echocardiogram performed on July 21 at [facility] that documents left ventricular ejection fraction 55-60 percent. Diastolic function is not commented on , but the mitral valve flow velocity suggests impaired relaxation with normal filling pressure.   she started taking aspirin after her emergency room evaluation on June 24. Her family and her primary care provider both seem to believe that she was taking aspirin even before that, but Minah insists that she wasn't.   She also describes episodes of "suddenly freezing to death" where she feels shaky , cloudy and faint and has to cover herself with blankets. The episodes resolve after lying in bed for 20-30 minutes. She has not had fever or diaphoresis.    Past Medical History  Diagnosis Date  . Hypothyroidism   . Anemia   . Seasonal allergies   . Depression   . SUI (stress urinary incontinence, female)   . OSA (obstructive sleep apnea) MODERATE PER STUDY 2005    CPAP NONCOMPLIANT  . History of kidney stones   . Nonischemic dilated cardiomyopathy     MODERATELY DEPRESSED LVF;EF 35-45% by Echo 05/27/11  . LBBB (left bundle branch block)   . Left ventricular ejection fraction less than 40%     38% PER CARDIOLOGIST NOTE (DR Audria Takeshita)  . Short of breath on exertion   . Chronic combined systolic and diastolic CHF, NYHA class 2 CARDIOLOGIST-  DR TDDUKGUR  .  Hypertension   . History of non-ST elevation myocardial infarction (NSTEMI) JUNE 2011    SECONDARY TO TAKOTSUDO SYNDROME (CARDIAC CATH NORMAL)  . Hyperlipemia   . Anxiety   . Asthma     related to sesonal allergies    Past Surgical History  Procedure Laterality Date  . Nasal septum surgery  1980's  . Abdominal hysterectomy  1985    partial  . Cataract extraction w/ intraocular lens  implant, bilateral    . Right ureteroscopic stone extraction  08-31-2000    . Knee arthroscopy w/ meniscectomy  07-27-2011    MEDIAL AND LATERAL  . Transthoracic echocardiogram  05-27-2011  DR Spruha Weight    MODERATELY DEPRESSED LVF DUE TO GLOBAL HYPOKINESIS AND MARKED SYSTOLIC ASYNCHRONY/ EF 63%/ MILD LEFT ATRIAL DILATATION  . Cardiac catheterization  09-04-1999;  08/25/2004;   12/09/2009  DR Marquee Fuchs    NORMAL CORONARIES/  APICAL BALLOONING OF LV CONSISTENT WITH TAKOTSUBO SYMPTOMS/ EF 30-35%  . Pubovaginal sling N/A 09/19/2012    Procedure: SUBURETHRAL Janyth Pupa;  Surgeon: Bernestine Amass, MD;  Location: [facility];  Service: Urology;  Laterality: N/A;  . Cystoscopy N/A 09/19/2012    Procedure: CYSTOSCOPY FLEXIBLE;  Surgeon: Bernestine Amass, MD;  Location: [facility];  Service: Urology;  Laterality: N/A;  . Cholecystectomy N/A 09/29/2012    Procedure: LAPAROSCOPIC CHOLECYSTECTOMY WITH INTRAOPERATIVE CHOLANGIOGRAM;  Surgeon: Adin Hector, MD;  Location: [facility];  Service: General;  Laterality: N/A;     Current Outpatient Prescriptions  Medication Sig Dispense Refill  . acetaminophen (TYLENOL) 325 MG tablet Take 162.5 mg by mouth every 6 (six) hours as needed for pain (for headache.).    Marland Kitchen albuterol (PROVENTIL HFA;VENTOLIN HFA) 108 (90 BASE) MCG/ACT inhaler Inhale 2 puffs into the lungs as needed.     Marland Kitchen aspirin 81 MG chewable tablet Chew 1 tablet (81 mg total) by mouth daily. 30 tablet 0  . atorvastatin (LIPITOR) 80 MG tablet TAKE 1/2 TABLET BY MOUTH DAILY FOR CHOLESTEROL 45 tablet 3  . bumetanide (BUMEX) 1 MG tablet Take 1 tablet (1 mg total) by mouth every morning. 90 tablet 2  . calcium carbonate (TUMS EX) 750 MG chewable tablet Chew 1 tablet by mouth daily.    . Cholecalciferol (VITAMIN D3) 2000 UNITS capsule Take 4,000 Units by mouth daily.     . diazepam (VALIUM) 10 MG tablet Take 1 tablet (10 mg total) by mouth 2 (two) times daily. 1 qam  1  prn 60 tablet 5  . levothyroxine (SYNTHROID, LEVOTHROID) 75 MCG tablet TAKE 1 TABLET BY MOUTH  EVERY DAY 90 tablet 0  . metoprolol succinate (TOPROL-XL) 50 MG 24 hr tablet Take 1 tablet (50 mg total) by mouth every morning. 30 tablet 6  . omeprazole (PRILOSEC) 20 MG capsule TAKE ONE CAPSULE BY MOUTH EVERY DAY 90 capsule 2  . potassium chloride (KLOR-CON M10) 10 MEQ tablet Take 1 tablet (10 mEq total) by mouth 2 (two) times daily. 180 tablet 3  . spironolactone (ALDACTONE) 25 MG tablet TAKE 1 TABLET BY MOUTH EVERY DAY 90 tablet 1  . losartan (COZAAR) 100 MG tablet Take 1 tablet (100 mg total) by mouth daily. 90 tablet 3  . NUVIGIL 250 MG tablet Take 1 tablet by mouth daily 30 tablet 2   No current facility-administered medications for this visit.    Allergies:   Codeine; Ace inhibitors; Brintellix; Citalopram; Fetzima; Lasix; Nsaids; and Xanax xr    Social History:  The patient  reports that she has never smoked. She has never used smokeless tobacco. She reports that she does not drink alcohol or use illicit drugs.   Family History:  The patient's family history includes Fibromyalgia in her brother; Heart attack in her father; Hypertension in her brother and mother; Pneumonia in her mother; Pulmonary embolism in her brother.    ROS:  Please see the history of present illness.    Otherwise, review of systems positive for  Musculoskeletal aches and pains, exertional dyspnea class II (short of breath walking to her mailbox or walking up stairs) , confusion and disorientation at times.   All other systems are reviewed and negative.    PHYSICAL EXAM: VS:  BP 98/74 mmHg  Pulse 84  Resp 16  Ht '5\' 3"'$  (1.6 m)  Wt 169 lb 11.2 oz (76.975 kg)  BMI 30.07 kg/m2 , BMI Body mass index is 30.07 kg/(m^2).  General: Alert, oriented x3, no distress Head: no evidence of trauma, PERRL, EOMI, no exophtalmos or lid lag, no myxedema, no xanthelasma; normal ears, nose and oropharynx Neck: normal jugular venous pulsations and no hepatojugular reflux; brisk carotid pulses without delay and no carotid  bruits Chest: clear to auscultation, no signs of consolidation by percussion or palpation, normal fremitus, symmetrical and full respiratory excursions Cardiovascular: normal position and quality of the apical impulse, regular rhythm, normal first and second heart sounds, no murmurs, rubs or gallops Abdomen: no tenderness or distention, no masses by palpation, no abnormal pulsatility or arterial bruits, normal bowel sounds, no hepatosplenomegaly Extremities: no clubbing, cyanosis or edema; 2+ radial, ulnar and brachial pulses bilaterally; 2+ right femoral, posterior tibial and dorsalis pedis pulses; 2+ left femoral, posterior tibial and dorsalis pedis pulses; no subclavian or femoral bruits Neurological: grossly nonfocal Psych: euthymic mood, full affect   EKG:  EKG is not ordered today. The ekg ordered 6/22 demonstrates  Sinus rhythm, left bundle branch block, QRS 170 ms, QTC 488 ms   Recent Labs: 08/23/2014: Magnesium 2.2; TSH 1.874 12/19/2014: ALT 21; BUN 23*; Creatinine, Ser 1.10*; Hemoglobin 12.9; Platelets 228; Potassium 3.5; Sodium 138    Lipid Panel    Component Value Date/Time   CHOL 253* 08/23/2014 1623   TRIG 373* 08/23/2014 1623   HDL 50 08/23/2014 1623   CHOLHDL 5.1 08/23/2014 1623   VLDL 75* 08/23/2014 1623   LDLCALC 128* 08/23/2014 1623      Wt Readings from Last 3 Encounters:  02/08/15 169 lb 11.2 oz (76.975 kg)  01/16/15 170 lb (77.111 kg)  12/19/14 172 lb (78.019 kg)      ASSESSMENT AND PLAN:  1.  Possible transient ischemic attacks -  Since the 2 episodes were identical in pattern, cardioembolic events would be unlikely. She has never had documented atrial fibrillation. She now has normal left ventricular systolic function. If she was not taking aspirin when they occurred, aspirin would indeed be appropriate therapy at this time. I think the recommendation for switching to clopidogrel was due to the fact that her PCP believed she was 40 on aspirin when she had  the neurological events. Of note, at least on one previous occasion Nataley had unusual neurological manifestations that seemed to be  Related to her depression and most likelya side effect of her numerous psychotropic medications, rather than a TIA/CVA.  Is at least on one occasion a severe headache ensued after the neurological events, complicated migraine should also be considered.  2.  Nonischemic cardiomyopathy, now with completely normalize left ventricular systolic function.  The resolution of my card dysfunction suggests that she may have had hypertensive heart disease. While she does have diastolic dysfunction, at the time of her recent echocardiogram filling pressures appear to be normal. She is taking a loop diuretic and has no clinical evidence of hypervolemia today  3.  Essential hypertension, controlled.  Note that she is taking 3 different diuretics (bumetanide, hydrochlorothiazide and spironolactone) her blood pressure is quite low. Recommend that she stop taking hydrochlorothiazide intake losartan alone. The episodes of dizziness, confusion and "sudden freezing" might be an expression of hypotension.  4. OSA on CPAP  5. Major depressive disorder  Current medicines are reviewed at length with the patient today.  The patient has concerns regarding medicines.   Labs/ tests ordered today include:  No orders of the defined types were placed in this encounter.    Patient Instructions  Medication Instructions:   STOP HYZAAR (LOSARTAN HCTZ)  START LOSARTAN '100MG'$  DAILY   Follow-Up:  October WITH DR. Sinda Du, MD  02/10/2015 2:42 PM    Sanda Klein, MD, [facility] [phone] office [phone] pager

## 2015-02-08 NOTE — Patient Instructions (Signed)
Medication Instructions:   STOP HYZAAR (LOSARTAN HCTZ)  START LOSARTAN '100MG'$  DAILY   Follow-Up:  October WITH DR. Sallyanne Kuster

## 2015-02-10 ENCOUNTER — Encounter: Payer: Self-pay | Admitting: Cardiovascular Disease

## 2015-02-12 ENCOUNTER — Encounter: Payer: Self-pay | Admitting: Cardiovascular Disease

## 2015-02-26 DIAGNOSIS — G459 Transient cerebral ischemic attack, unspecified: Secondary | ICD-10-CM | POA: Diagnosis not present

## 2015-02-26 DIAGNOSIS — F331 Major depressive disorder, recurrent, moderate: Secondary | ICD-10-CM | POA: Diagnosis not present

## 2015-02-26 DIAGNOSIS — Z6830 Body mass index (BMI) 30.0-30.9, adult: Secondary | ICD-10-CM | POA: Diagnosis not present

## 2015-02-26 DIAGNOSIS — N390 Urinary tract infection, site not specified: Secondary | ICD-10-CM | POA: Diagnosis not present

## 2015-03-21 ENCOUNTER — Ambulatory Visit (HOSPITAL_COMMUNITY): Payer: Self-pay | Admitting: Psychiatry

## 2015-04-02 ENCOUNTER — Other Ambulatory Visit: Payer: Self-pay | Admitting: Internal Medicine

## 2015-04-15 ENCOUNTER — Ambulatory Visit: Payer: Self-pay | Admitting: Cardiovascular Disease

## 2015-04-18 ENCOUNTER — Other Ambulatory Visit: Payer: Self-pay

## 2015-04-18 MED ORDER — METOPROLOL SUCCINATE ER 50 MG PO TB24: 50.0000 mg | ORAL_TABLET | ORAL | Status: DC

## 2015-04-24 ENCOUNTER — Ambulatory Visit (INDEPENDENT_AMBULATORY_CARE_PROVIDER_SITE_OTHER): Payer: Medicare Other | Admitting: Cardiovascular Disease

## 2015-04-24 ENCOUNTER — Encounter: Payer: Self-pay | Admitting: Cardiovascular Disease

## 2015-04-24 VITALS — BP 150/66 | HR 63 | Resp 16 | Ht 63.5 in | Wt 181.9 lb

## 2015-04-24 DIAGNOSIS — Z79899 Other long term (current) drug therapy: Secondary | ICD-10-CM

## 2015-04-24 DIAGNOSIS — R0602 Shortness of breath: Secondary | ICD-10-CM

## 2015-04-24 MED ORDER — METOPROLOL SUCCINATE ER 50 MG PO TB24: 50.0000 mg | ORAL_TABLET | ORAL | Status: DC

## 2015-04-24 MED ORDER — POTASSIUM CHLORIDE CRYS ER 10 MEQ PO TBCR: 10.0000 meq | EXTENDED_RELEASE_TABLET | Freq: Two times a day (BID) | ORAL | Status: DC

## 2015-04-24 MED ORDER — ATORVASTATIN CALCIUM 80 MG PO TABS: ORAL_TABLET | ORAL | Status: DC

## 2015-04-24 MED ORDER — LOSARTAN POTASSIUM 100 MG PO TABS: 100.0000 mg | ORAL_TABLET | Freq: Every day | ORAL | Status: DC

## 2015-04-24 MED ORDER — BUMETANIDE 1 MG PO TABS: ORAL_TABLET | ORAL | Status: DC

## 2015-04-24 NOTE — Patient Instructions (Signed)
Your physician has recommended you make the following change in your medication:   [place] 1 TABLET WITH 2 TABLETS  Your physician recommends that you return for lab work in:   3 WEEKS AT [facility]  Dr. Sallyanne Kuster recommends that you schedule a follow-up appointment in: 3 MONTHS

## 2015-04-24 NOTE — Progress Notes (Signed)
Cardiology Office Note   Date:  04/24/2015   ID:  Laura [name], DOB 1946-02-23, MRN [id number]  PCP:  Velna Hatchet, MD  Cardiologist:   Sanda Klein, MD   Chief Complaint  Patient presents with  . Follow-up    patient reports having a unsteady gait. hard to get her breathe at times.      History of Present Illness: Laura [name] is a 69 y.o. female who presents for  Follow-up of diastolic heart failure, obstructive sleep apnea , systemic hypertension. She does not feel as well as at her last appointment. She finds it hard to catch her breath if she has to walk up the slightest incline. She has not had lower extremity edema. She insists that her weight has been steady at home at around 270 pounds. We weighed her twice today and each time confirmed that her weight is now 182 pounds, 13 pounds higher than it was at her last appointment in August , on the same scale.   Her spells of feeling "washed out" have resolved after we discontinued her hydrochlorothiazide. Her potassium was 3.9 in August. A brain MRI showed some small vessel frontal lobe white matter disease, but no acute or old strokes.   She is seeing Dr. Oneal Grout for management of her sleep apnea and interrogation of her machine shows good reduction in the episodes of apnea (AHI decreased from 13.3-5.3. She still has occasional episodes of desaturation. She is only able to sleep on her back because of pain in her hips.   She has not had chest pain. She still has some issues with unsteady gait but has not had syncope or dizziness. She has not had any falls.  She thinks that her fatigue, daytime sleepiness, gait problems, occasional disorientation and many other somatic complaints might all be explained by treatment with spironolactone. She insists on stopping this medication.  She has residual diastolic dysfunction after improvement in systolic  Ventricular dysfunction on medical therapy. She has a chronic left bundle  branch block. Her most recent ejection fraction by echo performed in July at [facility] was 55-60%.  Wide variations in left ventricular ejection fraction over the years were related to a resolved episode of takotsubo cardiomyopathy (2011) as well as variations in compliance with antihypertensive treatment.  She had normal coronary arteries at angiography in 2011.   Past Medical History  Diagnosis Date  . Hypothyroidism   . Anemia   . Seasonal allergies   . Depression   . SUI (stress urinary incontinence, female)   . OSA (obstructive sleep apnea) MODERATE PER STUDY 2005    CPAP NONCOMPLIANT  . History of kidney stones   . Nonischemic dilated cardiomyopathy ([place])     MODERATELY DEPRESSED LVF;EF 35-45% by Echo 05/27/11  . LBBB (left bundle branch block)   . Left ventricular ejection fraction less than 40%     38% PER CARDIOLOGIST NOTE (DR Mashanda Ishibashi)  . Short of breath on exertion   . Chronic combined systolic and diastolic CHF, NYHA class 2 ([place]) CARDIOLOGIST-  DR KGURKYHC  . Hypertension   . History of non-ST elevation myocardial infarction (NSTEMI) JUNE 2011    SECONDARY TO TAKOTSUDO SYNDROME (CARDIAC CATH NORMAL)  . Hyperlipemia   . Anxiety   . Asthma     related to sesonal allergies    Past Surgical History  Procedure Laterality Date  . Nasal septum surgery  1980's  . Abdominal hysterectomy  1985    partial  .  Cataract extraction w/ intraocular lens  implant, bilateral    . Right ureteroscopic stone extraction  08-31-2000  . Knee arthroscopy w/ meniscectomy  07-27-2011    MEDIAL AND LATERAL  . Transthoracic echocardiogram  05-27-2011  DR Thessaly Mccullers    MODERATELY DEPRESSED LVF DUE TO GLOBAL HYPOKINESIS AND MARKED SYSTOLIC ASYNCHRONY/ EF 40%/ MILD LEFT ATRIAL DILATATION  . Cardiac catheterization  09-04-1999;  08/25/2004;   12/09/2009  DR Neill Jurewicz    NORMAL CORONARIES/  APICAL BALLOONING OF LV CONSISTENT WITH TAKOTSUBO SYMPTOMS/ EF 30-35%  . Pubovaginal sling N/A 09/19/2012      Procedure: SUBURETHRAL Janyth Pupa;  Surgeon: Bernestine Amass, MD;  Location: [facility];  Service: Urology;  Laterality: N/A;  . Cystoscopy N/A 09/19/2012    Procedure: CYSTOSCOPY FLEXIBLE;  Surgeon: Bernestine Amass, MD;  Location: [facility];  Service: Urology;  Laterality: N/A;  . Cholecystectomy N/A 09/29/2012    Procedure: LAPAROSCOPIC CHOLECYSTECTOMY WITH INTRAOPERATIVE CHOLANGIOGRAM;  Surgeon: Adin Hector, MD;  Location: [facility];  Service: General;  Laterality: N/A;     Current Outpatient Prescriptions  Medication Sig Dispense Refill  . acetaminophen (TYLENOL) 325 MG tablet Take 162.5 mg by mouth every 6 (six) hours as needed for pain (for headache.).    Marland Kitchen albuterol (PROVENTIL HFA;VENTOLIN HFA) 108 (90 BASE) MCG/ACT inhaler Inhale 2 puffs into the lungs as needed.     Marland Kitchen aspirin 81 MG chewable tablet Chew 1 tablet (81 mg total) by mouth daily. 30 tablet 0  . atorvastatin (LIPITOR) 80 MG tablet TAKE 1/2 TABLET BY MOUTH DAILY FOR CHOLESTEROL 45 tablet 3  . bumetanide (BUMEX) 1 MG tablet ALTERNATE ONE TABLET WITH TWO TABLETS 135 tablet 3  . diazepam (VALIUM) 10 MG tablet Take 1 tablet (10 mg total) by mouth 2 (two) times daily. 1 qam  1  prn 60 tablet 5  . levothyroxine (SYNTHROID, LEVOTHROID) 75 MCG tablet TAKE 1 TABLET BY MOUTH EVERY DAY 90 tablet 0  . losartan (COZAAR) 100 MG tablet Take 1 tablet (100 mg total) by mouth daily. 90 tablet 3  . metoprolol succinate (TOPROL-XL) 50 MG 24 hr tablet Take 1 tablet (50 mg total) by mouth every morning. 90 tablet 3  . omeprazole (PRILOSEC) 20 MG capsule TAKE ONE CAPSULE BY MOUTH EVERY DAY 90 capsule 2  . potassium chloride (KLOR-CON M10) 10 MEQ tablet Take 1 tablet (10 mEq total) by mouth 2 (two) times daily. 180 tablet 3  . NUVIGIL 250 MG tablet Take 1 tablet by mouth daily 30 tablet 2   No current facility-administered medications for this visit.    Allergies:   Codeine; Ace inhibitors; Brintellix; Citalopram;  Fetzima; Lasix; Nsaids; and Xanax xr    Social History:  The patient  reports that she has never smoked. She has never used smokeless tobacco. She reports that she does not drink alcohol or use illicit drugs.   Family History:  The patient's family history includes Fibromyalgia in her brother; Heart attack in her father; Hypertension in her brother and mother; Pneumonia in her mother; Pulmonary embolism in her brother.    ROS:  Please see the history of present illness.    Otherwise, review of systems positive for  Headaches, occasional disorientation , anxiety.   All other systems are reviewed and negative.    PHYSICAL EXAM: VS:  BP 153/66 mmHg  Pulse 63  Ht 5' 3.5" (1.613 m)  Wt 181 lb 14.4 oz (82.509 kg)  BMI 31.71 kg/m2 ,  BMI Body mass index is 31.71 kg/(m^2).  General: Alert, oriented x3, no distress Head: no evidence of trauma, PERRL, EOMI, no exophtalmos or lid lag, no myxedema, no xanthelasma; normal ears, nose and oropharynx Neck: normal jugular venous pulsations and no hepatojugular reflux; brisk carotid pulses without delay and no carotid bruits Chest: clear to auscultation, no signs of consolidation by percussion or palpation, normal fremitus, symmetrical and full respiratory excursions Cardiovascular: normal position and quality of the apical impulse, regular rhythm, normal first and  Paradoxically split second heart sounds, no murmurs, rubs or gallops Abdomen: no tenderness or distention, no masses by palpation, no abnormal pulsatility or arterial bruits, normal bowel sounds, no hepatosplenomegaly Extremities: no clubbing, cyanosis or edema; 2+ radial, ulnar and brachial pulses bilaterally; 2+ right femoral, posterior tibial and dorsalis pedis pulses; 2+ left femoral, posterior tibial and dorsalis pedis pulses; no subclavian or femoral bruits Neurological: grossly nonfocal Psych: euthymic mood, full affect   EKG:  EKG is not ordered today.   Recent Labs: 08/23/2014:  Magnesium 2.2; TSH 1.874 12/19/2014: ALT 21; BUN 23*; Creatinine, Ser 1.10*; Hemoglobin 12.9; Platelets 228; Potassium 3.5; Sodium 138    Lipid Panel    Component Value Date/Time   CHOL 253* 08/23/2014 1623   TRIG 373* 08/23/2014 1623   HDL 50 08/23/2014 1623   CHOLHDL 5.1 08/23/2014 1623   VLDL 75* 08/23/2014 1623   LDLCALC 128* 08/23/2014 1623      Wt Readings from Last 3 Encounters:  04/24/15 181 lb 14.4 oz (82.509 kg)  02/08/15 169 lb 11.2 oz (76.975 kg)  01/16/15 170 lb (77.111 kg)     .   ASSESSMENT AND PLAN:  1.  Chronic diastolic heart failure. Despite edema , her weight has increased by 13 pounds in about 2 months and I suspect this is at least partly due to fluid retention. Have asked her to increase her diuretic to 2 mg alternating with 1 mg daily. Reevaluate labs in 2-3 weeks , focusing especially in potassium level and creatinine level.  it is possible that she began to have more fluid retention when we discontinued hydrochlorothiazide in August , even though she continues to take the loop diuretic  At her request we'll discontinue the spironolactone which she thinks is responsible for many side effects.  2.  Essential hypertension, slightly elevated blood pressure today. We'll wait to see what her blood pressure does with the changes in her diuretic prescription.  3.  Obstructive sleep apnea with at least moderate improvement in apnea-hypopnea index with the treatment provided by Dr. Ron Parker. She continues to have excessive daytime sleepiness and needs to take Nuvigil.  4.  Long-standing left bundle branch block  5.  Major depressive disorder on multiple medications  Current medicines are reviewed at length with the patient today.  The patient has concerns regarding medicines.  The following changes have been made:   Stop spironolactone, increase Bumex to 1 mg alternating with 2 mg daily  Labs/ tests ordered today include:  Orders Placed This Encounter    Procedures  . Basic metabolic panel  . Brain natriuretic peptide     Patient Instructions  Your physician has recommended you make the following change in your medication:   [place] 1 TABLET WITH 2 TABLETS  Your physician recommends that you return for lab work in:   3 WEEKS AT [facility]  Dr. Sallyanne Kuster recommends that you schedule a follow-up appointment in: 3 MONTHS  Mikael Spray, MD  04/24/2015 2:09 PM    Sanda Klein, MD, [facility] [phone] office [phone] pager

## 2015-04-29 ENCOUNTER — Encounter: Payer: Self-pay | Admitting: Cardiovascular Disease

## 2015-05-06 ENCOUNTER — Other Ambulatory Visit: Payer: Self-pay | Admitting: Internal Medicine

## 2015-05-15 DIAGNOSIS — Z79899 Other long term (current) drug therapy: Secondary | ICD-10-CM | POA: Diagnosis not present

## 2015-05-15 DIAGNOSIS — R0602 Shortness of breath: Secondary | ICD-10-CM | POA: Diagnosis not present

## 2015-05-16 LAB — BASIC METABOLIC PANEL
BUN: 18 mg/dL (ref 7–25)
CO2: 30 mmol/L (ref 20–31)
CREATININE: 0.75 mg/dL (ref 0.50–0.99)
Calcium: 9.3 mg/dL (ref 8.6–10.4)
Chloride: 103 mmol/L (ref 98–110)
Glucose, Bld: 122 mg/dL — ABNORMAL HIGH (ref 65–99)
Potassium: 4 mmol/L (ref 3.5–5.3)
Sodium: 140 mmol/L (ref 135–146)

## 2015-05-16 LAB — BRAIN NATRIURETIC PEPTIDE: BRAIN NATRIURETIC PEPTIDE: 21.7 pg/mL (ref 0.0–100.0)

## 2015-07-02 ENCOUNTER — Telehealth: Payer: Self-pay | Admitting: Cardiovascular Disease

## 2015-07-02 NOTE — Telephone Encounter (Signed)
Pt found a company that she can get her medicine at a lower price. She have some paperwork that she needs Dr C to fill out.do you think he will do it?

## 2015-07-02 NOTE — Telephone Encounter (Signed)
Returned call to patient.She stated she is changing Primary care MD.Stated she needs a form filled out to get Nuvigal.She wanted to know if Dr.Croitoru will fill out form.Message sent to Dr.Croitoru.

## 2015-07-03 NOTE — Telephone Encounter (Signed)
I am afraid I cannot do that. I believe it was prescribed by her neurologist. She should call there.

## 2015-07-03 NOTE — Telephone Encounter (Signed)
This medication should be prescribed by a neurologist or pulmonary/sleep specialist. I cannot help.

## 2015-07-03 NOTE — Telephone Encounter (Signed)
Returned call to patient.Dr.Croitoru's unable to sign paperwork.Advised to have prescribing M.D.neurologist sign.Patient stated psychiatrist she no longer sees prescribed and then PCP see no longer sees refilled.Stated all Dr.Croitoru has to do is sign to verify she is taking nuvigal.Stated she wanted to ask him again she has no other Dr.

## 2015-07-04 NOTE — Telephone Encounter (Signed)
Returned call to patient.Left message on personal voice mail Dr.Croitoru's recommendations.

## 2015-07-18 ENCOUNTER — Ambulatory Visit: Payer: Self-pay | Admitting: Cardiovascular Disease

## 2015-07-24 DIAGNOSIS — F331 Major depressive disorder, recurrent, moderate: Secondary | ICD-10-CM | POA: Diagnosis not present

## 2015-08-08 ENCOUNTER — Encounter: Payer: Self-pay | Admitting: Cardiovascular Disease

## 2015-08-08 ENCOUNTER — Ambulatory Visit (INDEPENDENT_AMBULATORY_CARE_PROVIDER_SITE_OTHER): Payer: Medicare Other | Admitting: Cardiovascular Disease

## 2015-08-08 VITALS — BP 150/74 | HR 84 | Ht 63.5 in | Wt 179.1 lb

## 2015-08-08 DIAGNOSIS — I429 Cardiomyopathy, unspecified: Secondary | ICD-10-CM

## 2015-08-08 DIAGNOSIS — G4733 Obstructive sleep apnea (adult) (pediatric): Secondary | ICD-10-CM

## 2015-08-08 DIAGNOSIS — E782 Mixed hyperlipidemia: Secondary | ICD-10-CM | POA: Diagnosis not present

## 2015-08-08 DIAGNOSIS — I10 Essential (primary) hypertension: Secondary | ICD-10-CM

## 2015-08-08 DIAGNOSIS — I447 Left bundle-branch block, unspecified: Secondary | ICD-10-CM

## 2015-08-08 DIAGNOSIS — E669 Obesity, unspecified: Secondary | ICD-10-CM

## 2015-08-08 NOTE — Progress Notes (Signed)
Patient ID: Laura [name], female   DOB: 1946/03/07, 70 y.o.   MRN: [id number]    Cardiology Office Note    Date:  08/08/2015   ID:  Laura [name], DOB March 20, 1946, MRN [id number]  PCP:  Velna Hatchet, MD  Cardiologist:   Sanda Klein, MD   Chief Complaint  Patient presents with  . Follow-up    3 month//pt c/o SOB on exertion, no other Sx.    History of Present Illness:  KOBI ALLER is a 70 y.o. female with a diagnosis of mild nonischemic cardiomyopathy, EF 30-35 percent by LV angiography in June 2011, follow up echo EF 38% in November 2012, most recently estimated left ventricular ejection fraction 45-50 percent by echo performed in our practice in March 2014. She has a long-standing left bundle branch block. She did have another echocardiogram performed at [place] which interpreted her ejection fraction has been 55-60 percent. I do not have access to those images.  She has been on treatment with angiotensin receptor blocker and beta blockers and a low dose of loop diuretic and potassium supplement. She reports that she has no cardiovascular complaints other than mild exertional dyspnea, NYHA functional class II. She also bears a diagnosis of obstructive sleep apnea and has had difficulty with CPAP. She is currently seeing a pain orthodontist specialist to discuss alternative treatments. She has been prescribed Nuvigil.  Over the years she has been on a variety of psychotropic medications. Currently, other than Nuvigil, she is only taking diazepam.  She has numerous questions today about her diagnosis and is upset with the fact that she has had difficulty acquiring both medical insurance coverage and life insurance coverage due to the fact that she has "heart disease". The discussion strayed into the recent diagnosis of coronary disease in her brother and his need for bypass surgery. She is upset about increasingly high costs for acquiring insurance coverage. She began  expressing her problems with care received from several other physicians. The discussion became very circuitous and we really did not have any time to discuss her current symptoms and medical condition.  Past Medical History  Diagnosis Date  . Hypothyroidism   . Anemia   . Seasonal allergies   . Depression   . SUI (stress urinary incontinence, female)   . OSA (obstructive sleep apnea) MODERATE PER STUDY 2005    CPAP NONCOMPLIANT  . History of kidney stones   . Nonischemic dilated cardiomyopathy ([place])     MODERATELY DEPRESSED LVF;EF 35-45% by Echo 05/27/11  . LBBB (left bundle branch block)   . Left ventricular ejection fraction less than 40%     38% PER CARDIOLOGIST NOTE (DR Traeger Sultana)  . Short of breath on exertion   . Chronic combined systolic and diastolic CHF, NYHA class 2 ([place]) CARDIOLOGIST-  DR [id number]  . Hypertension   . History of non-ST elevation myocardial infarction (NSTEMI) JUNE 2011    SECONDARY TO TAKOTSUDO SYNDROME (CARDIAC CATH NORMAL)  . Hyperlipemia   . Anxiety   . Asthma     related to sesonal allergies    Past Surgical History  Procedure Laterality Date  . Nasal septum surgery  1980's  . Abdominal hysterectomy  1985    partial  . Cataract extraction w/ intraocular lens  implant, bilateral    . Right ureteroscopic stone extraction  08-31-2000  . Knee arthroscopy w/ meniscectomy  07-27-2011    MEDIAL AND LATERAL  . Transthoracic echocardiogram  05-27-2011  DR Eve Rey  MODERATELY DEPRESSED LVF DUE TO GLOBAL HYPOKINESIS AND MARKED SYSTOLIC ASYNCHRONY/ EF 78%/ MILD LEFT ATRIAL DILATATION  . Cardiac catheterization  09-04-1999;  08/25/2004;   12/09/2009  DR Darreon Lutes    NORMAL CORONARIES/  APICAL BALLOONING OF LV CONSISTENT WITH TAKOTSUBO SYMPTOMS/ EF 30-35%  . Pubovaginal sling N/A 09/19/2012    Procedure: SUBURETHRAL Janyth Pupa;  Surgeon: Bernestine Amass, MD;  Location: [facility];  Service: Urology;  Laterality: N/A;  . Cystoscopy N/A  09/19/2012    Procedure: CYSTOSCOPY FLEXIBLE;  Surgeon: Bernestine Amass, MD;  Location: [facility];  Service: Urology;  Laterality: N/A;  . Cholecystectomy N/A 09/29/2012    Procedure: LAPAROSCOPIC CHOLECYSTECTOMY WITH INTRAOPERATIVE CHOLANGIOGRAM;  Surgeon: Adin Hector, MD;  Location: [place] [place];  Service: General;  Laterality: N/A;    Outpatient Prescriptions Prior to Visit  Medication Sig Dispense Refill  . albuterol (PROVENTIL HFA;VENTOLIN HFA) 108 (90 BASE) MCG/ACT inhaler Inhale 2 puffs into the lungs as needed.     Marland Kitchen atorvastatin (LIPITOR) 80 MG tablet TAKE 1/2 TABLET BY MOUTH DAILY FOR CHOLESTEROL 45 tablet 3  . bumetanide (BUMEX) 1 MG tablet ALTERNATE ONE TABLET WITH TWO TABLETS 135 tablet 3  . diazepam (VALIUM) 10 MG tablet Take 1 tablet (10 mg total) by mouth 2 (two) times daily. 1 qam  1  prn 60 tablet 5  . levothyroxine (SYNTHROID, LEVOTHROID) 75 MCG tablet TAKE 1 TABLET BY MOUTH EVERY DAY 90 tablet 0  . losartan (COZAAR) 100 MG tablet Take 1 tablet (100 mg total) by mouth daily. 90 tablet 3  . metoprolol succinate (TOPROL-XL) 50 MG 24 hr tablet Take 1 tablet (50 mg total) by mouth every morning. 90 tablet 3  . potassium chloride (KLOR-CON M10) 10 MEQ tablet Take 1 tablet (10 mEq total) by mouth 2 (two) times daily. 180 tablet 3  . acetaminophen (TYLENOL) 325 MG tablet Take 162.5 mg by mouth every 6 (six) hours as needed for pain (for headache.).    Marland Kitchen aspirin 81 MG chewable tablet Chew 1 tablet (81 mg total) by mouth daily. 30 tablet 0  . NUVIGIL 250 MG tablet Take 1 tablet by mouth daily 30 tablet 2  . omeprazole (PRILOSEC) 20 MG capsule TAKE ONE CAPSULE BY MOUTH EVERY DAY 90 capsule 2   No facility-administered medications prior to visit.     Allergies:   Codeine; Ace inhibitors; Brintellix; Citalopram; Fetzima; Lasix; Nsaids; and Xanax xr   Social History   Social History  . Marital Status: Married    Spouse Name: N/A  . Number of Children: N/A  . Years of  Education: N/A   Social History Main Topics  . Smoking status: Never Smoker   . Smokeless tobacco: Never Used  . Alcohol Use: No  . Drug Use: No  . Sexual Activity: No   Other Topics Concern  . None   Social History Narrative     Family History:  The patient's family history includes Fibromyalgia in her brother; Heart attack in her father; Hypertension in her brother and mother; Pneumonia in her mother; Pulmonary embolism in her brother. her brother recently had coronary bypass surgery  ROS:   Please see the history of present illness.    ROS All other systems reviewed and are negative.   PHYSICAL EXAM:   VS:  BP 150/74 mmHg  Pulse 84  Ht 5' 3.5" (1.613 m)  Wt 81.239 kg (179 lb 1.6 oz)  BMI 31.22 kg/m2   She is  very emotional today, trembling when she speaks  Wt Readings from Last 3 Encounters:  08/08/15 81.239 kg (179 lb 1.6 oz)  04/24/15 82.509 kg (181 lb 14.4 oz)  02/08/15 76.975 kg (169 lb 11.2 oz)      Studies/Labs Reviewed:   EKG:  EKG is ordered today.  The ekg ordered today demonstrates sinus rhythm and left bundle branch block  Recent Labs: 08/23/2014: Magnesium 2.2; TSH 1.874 12/19/2014: ALT 21; Hemoglobin 12.9; Platelets 228 05/15/2015: BUN 18; Creat 0.75; Potassium 4.0; Sodium 140   Lipid Panel    Component Value Date/Time   CHOL 253* 08/23/2014 1623   TRIG 373* 08/23/2014 1623   HDL 50 08/23/2014 1623   CHOLHDL 5.1 08/23/2014 1623   VLDL 75* 08/23/2014 1623   LDLCALC 128* 08/23/2014 1623    ASSESSMENT:    1. Cardiomyopathy- EF 45-50% by echo 09/26/12   2. LBBB (left bundle branch block)   3. Essential hypertension   4. Mixed hyperlipidemia   5. Obesity   6. Obstructive sleep apnea      PLAN:  In order of problems listed above:  1. Not withstanding the echocardiogram performed at [facility] in 2016, I believe that Mrs. Clemson's diagnosis of dilated cardiomyopathy holds true. Numerous imaging studies have confirmed varying degrees  of left ventricular dysfunction over the years. Generally, when she has been compliant with treatment for cardiomyopathy (beta blockers and angiotensin receptor blockers) left ventricular systolic function has improved. I would be very pleased to know that her left ventricular ejection fraction is now completely normal, although this would be a surprisingly positive development. Her heart failure symptoms are well compensated and is in far as I can tell with the limited evaluation that was possible today, she is euvolemic. Therefore, I believe she is on the right medical treatment 2. Chronic left bundle branch block would offer her the option for resynchronization therapy should she develop worsening ejection fraction and heart failure 3. Her blood pressure is elevated today, but I think this is due to the fact that she is clearly upset. I would not change her medications based on today's blood pressure reading 4. Her lipid profile parameters were more recently checked by her primary care provider per her report. I have labs from August 2016 but these do not have a fasting lipid profile. Will request those results. She does not have known coronary or other vascular obstructive disease. 5. Mild obesity, weight loss would be beneficial 6. Exploring treatments other than CPAP  I tried to guide the discussion towards Mrs. Primmer's medical problems, although the discussion repeatedly deviated in other directions such as her brother's medical problems or her own financial concerns. She is upset with the fact that her diagnosis of cardiomyopathy is causing financial issues. I strongly encouraged her to seek another opinion from either one of my partners or from a physician outside our practice, and will be glad to provide all the information we have to the cardiologist of her choice.    Medication Adjustments/Labs and Tests Ordered: Current medicines are reviewed at length with the patient today.  Concerns  regarding medicines are outlined above.  Medication changes, Labs and Tests ordered today are listed in the Patient Instructions below. There are no Patient Instructions on file for this visit.     Mikael Spray, MD  08/08/2015 8:17 AM    [facility] [place], [place], [place]  [address] Phone: [phone]; Fax: [phone]

## 2015-08-08 NOTE — Patient Instructions (Signed)
Your physician wants you to follow-up in: [place] will receive a reminder letter in the mail two months in advance. If you don't receive a letter, please call our office to schedule the follow-up appointment.

## 2015-08-14 DIAGNOSIS — F331 Major depressive disorder, recurrent, moderate: Secondary | ICD-10-CM | POA: Diagnosis not present

## 2015-08-20 DIAGNOSIS — F39 Unspecified mood [affective] disorder: Secondary | ICD-10-CM | POA: Diagnosis not present

## 2015-09-04 DIAGNOSIS — Z0000 Encounter for general adult medical examination without abnormal findings: Secondary | ICD-10-CM | POA: Diagnosis not present

## 2015-09-04 DIAGNOSIS — R229 Localized swelling, mass and lump, unspecified: Secondary | ICD-10-CM | POA: Diagnosis not present

## 2015-09-04 DIAGNOSIS — E039 Hypothyroidism, unspecified: Secondary | ICD-10-CM | POA: Diagnosis not present

## 2015-09-04 DIAGNOSIS — I10 Essential (primary) hypertension: Secondary | ICD-10-CM | POA: Diagnosis not present

## 2015-09-04 DIAGNOSIS — Z1159 Encounter for screening for other viral diseases: Secondary | ICD-10-CM | POA: Diagnosis not present

## 2015-09-04 DIAGNOSIS — G4730 Sleep apnea, unspecified: Secondary | ICD-10-CM | POA: Diagnosis not present

## 2015-09-04 DIAGNOSIS — M545 Low back pain: Secondary | ICD-10-CM | POA: Diagnosis not present

## 2015-09-04 DIAGNOSIS — E7800 Pure hypercholesterolemia, unspecified: Secondary | ICD-10-CM | POA: Diagnosis not present

## 2015-09-04 DIAGNOSIS — R2689 Other abnormalities of gait and mobility: Secondary | ICD-10-CM | POA: Diagnosis not present

## 2015-09-10 ENCOUNTER — Encounter: Payer: Self-pay | Admitting: Internal Medicine

## 2015-09-11 DIAGNOSIS — F331 Major depressive disorder, recurrent, moderate: Secondary | ICD-10-CM | POA: Diagnosis not present

## 2015-09-13 DIAGNOSIS — L820 Inflamed seborrheic keratosis: Secondary | ICD-10-CM | POA: Diagnosis not present

## 2015-09-13 DIAGNOSIS — L814 Other melanin hyperpigmentation: Secondary | ICD-10-CM | POA: Diagnosis not present

## 2015-09-13 DIAGNOSIS — D489 Neoplasm of uncertain behavior, unspecified: Secondary | ICD-10-CM | POA: Diagnosis not present

## 2015-09-16 DIAGNOSIS — F332 Major depressive disorder, recurrent severe without psychotic features: Secondary | ICD-10-CM | POA: Diagnosis not present

## 2015-09-17 ENCOUNTER — Ambulatory Visit: Payer: Medicare Other | Attending: Family Medicine | Admitting: Physical Therapy

## 2015-09-17 DIAGNOSIS — M545 Low back pain: Secondary | ICD-10-CM | POA: Diagnosis not present

## 2015-09-17 DIAGNOSIS — R2681 Unsteadiness on feet: Secondary | ICD-10-CM | POA: Diagnosis not present

## 2015-09-17 DIAGNOSIS — R269 Unspecified abnormalities of gait and mobility: Secondary | ICD-10-CM | POA: Insufficient documentation

## 2015-09-18 NOTE — Therapy (Signed)
[name] [address] Laura [name], [place], [address] Phone: [phone]   Fax:  [phone]  Physical Therapy Evaluation  Patient Details  Name: Laura [name] MRN: [id number] Date of Birth: 1946-05-10 Referring Provider: Dr. Shirline Frees  Encounter Date: 09/17/2015      PT End of Session - 09/18/15 2049    Visit Number 1   Number of Visits 9   Date for PT Re-Evaluation 10/18/15   Authorization Type Medicare   Authorization Time Period 09-17-15 - 11-16-15   PT Start Time 1147   PT Stop Time 1236   PT Time Calculation (min) 49 min      Past Medical History  Diagnosis Date  . Hypothyroidism   . Anemia   . Seasonal allergies   . Depression   . SUI (stress urinary incontinence, female)   . OSA (obstructive sleep apnea) MODERATE PER STUDY 2005    CPAP NONCOMPLIANT  . History of kidney stones   . Nonischemic dilated cardiomyopathy ([place])     MODERATELY DEPRESSED LVF;EF 35-45% by Echo 05/27/11  . LBBB (left bundle branch block)   . Left ventricular ejection fraction less than 40%     38% PER CARDIOLOGIST NOTE (DR CROITORU)  . Short of breath on exertion   . Chronic combined systolic and diastolic CHF, NYHA class 2 ([place]) CARDIOLOGIST-  DR [id number]  . Hypertension   . History of non-ST elevation myocardial infarction (NSTEMI) JUNE 2011    SECONDARY TO TAKOTSUDO SYNDROME (CARDIAC CATH NORMAL)  . Hyperlipemia   . Anxiety   . Asthma     related to sesonal allergies    Past Surgical History  Procedure Laterality Date  . Nasal septum surgery  1980's  . Abdominal hysterectomy  1985    partial  . Cataract extraction w/ intraocular lens  implant, bilateral    . Right ureteroscopic stone extraction  08-31-2000  . Knee arthroscopy w/ meniscectomy  07-27-2011    MEDIAL AND LATERAL  . Transthoracic echocardiogram  05-27-2011  DR CROITORU    MODERATELY DEPRESSED LVF DUE TO GLOBAL HYPOKINESIS AND MARKED SYSTOLIC  ASYNCHRONY/ EF 50%/ MILD LEFT ATRIAL DILATATION  . Cardiac catheterization  09-04-1999;  08/25/2004;   12/09/2009  DR CROITORU    NORMAL CORONARIES/  APICAL BALLOONING OF LV CONSISTENT WITH TAKOTSUBO SYMPTOMS/ EF 30-35%  . Pubovaginal sling N/A 09/19/2012    Procedure: SUBURETHRAL Janyth Pupa;  Surgeon: Bernestine Amass, MD;  [facility];  Service: Urology;  Laterality: N/A;  . Cystoscopy N/A 09/19/2012    Procedure: CYSTOSCOPY FLEXIBLE;  Surgeon: Bernestine Amass, MD;  Location: [facility];  Service: Urology;  Laterality: N/A;  . Cholecystectomy N/A 09/29/2012    Procedure: LAPAROSCOPIC CHOLECYSTECTOMY WITH INTRAOPERATIVE CHOLANGIOGRAM;  Surgeon: Adin Hector, MD;  Location: [place];  Service: General;  Laterality: N/A;    There were no vitals filed for this visit.  Visit Diagnosis:  Low back pain without sciatica, unspecified back pain laterality - Plan: PT plan of care cert/re-cert  Abnormality of gait - Plan: PT plan of care cert/re-cert  Unsteadiness - Plan: PT plan of care cert/re-cert      Subjective Assessment - 09/18/15 2035    Subjective Pt reports she has had problems since 2012; thinks she has anxiety/panic attacks; has episodes of unsteadiness resulting in bumping into walls and hits doorways   Pertinent History hypoglycemia, LBBB, depression, anxiety, panic attacks, nonischemic dilated cardiomyopathy, HTN, OSA, chronic LBP  Patient Stated Goals improve balance            [facility] PT Assessment - 09/18/15 0001    Assessment   Medical Diagnosis Imbalance:  Falls   Referring Provider Dr. Shirline Frees   Onset Date/Surgical Date --  2014   Balance Screen   Has the patient fallen in the past 6 months No   Has the patient had a decrease in activity level because of a fear of falling?  No   Is the patient reluctant to leave their home because of a fear of falling?  No   Home Environment   Living Environment Private residence   Type of  [place] Access Stairs to enter  2   Entrance Stairs-Number of Steps 2   Entrance Stairs-Rails Can reach both   Home Layout Two level;Laundry or work area in basement   Prior Function   Level of Independence Independent   Vocation Retired   Advice worker Within functional limits for tasks performed  for bil. LE's   Ambulation/Gait   Ambulation/Gait Yes   Ambulation Distance (Feet) 100 Feet   Assistive device None   Gait Pattern Within Functional Limits   Ambulation Surface Level;Indoor   Gait velocity 3.71  8.84   Standardized Balance Assessment   Standardized Balance Assessment Timed Up and Go Test   Timed Up and Go Test   Normal TUG (seconds) 9.97                                PT Long Term Goals - 09/18/15 2054    PT LONG TERM GOAL #1   Title Incr. gait velocity to >/= 4.0 ft/sec for incr. gait efficiency.  (10-18-15)   Baseline 3.71 ft/sec   Time 4   Period Weeks   Status New   PT LONG TERM GOAL #2   Title Incr. SLS on RLE to >/= 5 secs to demo improved balance.  (10-18-15)   Time 4   Period Weeks   Status New   PT LONG TERM GOAL #3   Title Pt will report at least 30% improvement in balance and steadiness with ambulation.  (10-18-15)   Time 4   Period Weeks   Status New   PT LONG TERM GOAL #4   Title Independent in HEP for back and balance exercises.  (10-18-15)   Time 4   Period Weeks   Status New               Plan - 09/18/15 2049    Clinical Impression Statement Pt is a 70 year old lady with c/o LBP and unsteadiness that impact balance; comorbidities include dilated cardiomyopathy, depression, anxiety/panic attacks, LBBB, HTN and OSA.  Status is evolving with moderate complex decision making required in POC.   Pt will benefit from skilled therapeutic intervention in order to improve on the following deficits Difficulty walking;Abnormal gait;Decreased balance;Decreased mobility;Decreased strength;Decreased  activity tolerance;Pain;Cardiopulmonary status limiting activity   Rehab Potential Good   PT Frequency 2x / week   PT Duration 4 weeks   PT Treatment/Interventions ADLs/Self Care Home Management;Therapeutic exercise;Therapeutic activities;Functional mobility training;Stair training;Gait training;Balance training;Neuromuscular re-education;Patient/family education   PT Next Visit Plan begin HEP - back exercises and high level balance    PT Home Exercise Plan see above   Consulted and Agree with Plan of Care Patient  G-Codes - 09/17/15 2101    Functional Assessment Tool Used TUG 9.97 without device;  gait velocity 3.71 ft/sec ; c/o LBP   Functional Limitation Mobility: Walking and moving around   Mobility: Walking and Moving Around Current Status [phone]) At least 20 percent but less than 40 percent impaired, limited or restricted   Mobility: Walking and Moving Around Goal Status [phone]) At least 1 percent but less than 20 percent impaired, limited or restricted       Problem List Patient Active Problem List   Diagnosis Date Noted  . Mixed hyperlipidemia 08/23/2014  . Major depressive disorder, recurrent episode, severe ([place]) 09/22/2013  . Vitamin D Deficiency 07/28/2013  . Medication management 07/28/2013  . Prediabetes 07/28/2013  . Obstructive sleep apnea 05/27/2013  . Arrhythmia 04/14/2013  . Palpitations 03/12/2013  . Delayed gastric emptying 10/18/2012  . Steatohepatitis, nonalcoholic 41/96/2229  . Female stress incontinence 09/19/2012  . Takotsubo syndrome, June 2011.(normal coronaries) 09/12/2012  . Cardiomyopathy- EF 45-50% by echo 09/26/12 09/12/2012  . Anxiety disorder  09/12/2012  . HTN (hypertension) 09/12/2012  . LBBB (left bundle branch block) 09/12/2012  . Obesity 09/12/2012  . Sleep apnea- non compliant with C-pap 09/12/2012    Umar Patmon, [place], PT 09/18/2015, 9:06 PM  [facility] [address] [place], [place], [address] Phone: [phone]   Fax:  [phone]  Name: ZENDAYA GROSECLOSE MRN: [id number] Date of Birth: [birth year]

## 2015-09-20 DIAGNOSIS — F331 Major depressive disorder, recurrent, moderate: Secondary | ICD-10-CM | POA: Diagnosis not present

## 2015-09-24 ENCOUNTER — Ambulatory Visit: Payer: Medicare Other | Admitting: Physical Therapy

## 2015-09-24 DIAGNOSIS — R2681 Unsteadiness on feet: Secondary | ICD-10-CM

## 2015-09-24 DIAGNOSIS — M545 Low back pain: Secondary | ICD-10-CM

## 2015-09-24 DIAGNOSIS — R269 Unspecified abnormalities of gait and mobility: Secondary | ICD-10-CM | POA: Diagnosis not present

## 2015-09-25 NOTE — Patient Instructions (Signed)
Pt given packet for back exercises - strengthening and stretching

## 2015-09-25 NOTE — Therapy (Signed)
[facility] [address] [place], [place], [address] Phone: [phone]   Fax:  [phone]  Physical Therapy Treatment  Patient Details  Name: Laura [name] MRN: [id number] Date of Birth: [birth year] Referring Provider: Dr. Shirline Frees  Encounter Date: 09/24/2015      PT End of Session - 09/25/15 1507    Visit Number 2   Number of Visits 9   Date for PT Re-Evaluation 10/18/15   Authorization Type Medicare   Authorization Time Period 09-17-15 - 11-16-15   PT Start Time 1445   PT Stop Time 1540   PT Time Calculation (min) 55 min      Past Medical History  Diagnosis Date  . Hypothyroidism   . Anemia   . Seasonal allergies   . Depression   . SUI (stress urinary incontinence, female)   . OSA (obstructive sleep apnea) MODERATE PER STUDY 2005    CPAP NONCOMPLIANT  . History of kidney stones   . Nonischemic dilated cardiomyopathy ([place])     MODERATELY DEPRESSED LVF;EF 35-45% by Echo 05/27/11  . LBBB (left bundle branch block)   . Left ventricular ejection fraction less than 40%     38% PER CARDIOLOGIST NOTE (DR CROITORU)  . Short of breath on exertion   . Chronic combined systolic and diastolic CHF, NYHA class 2 ([place]) CARDIOLOGIST-  DR [id number]  . Hypertension   . History of non-ST elevation myocardial infarction (NSTEMI) JUNE 2011    SECONDARY TO TAKOTSUDO SYNDROME (CARDIAC CATH NORMAL)  . Hyperlipemia   . Anxiety   . Asthma     related to sesonal allergies    Past Surgical History  Procedure Laterality Date  . Nasal septum surgery  1980's  . Abdominal hysterectomy  1985    partial  . Cataract extraction w/ intraocular lens  implant, bilateral    . Right ureteroscopic stone extraction  08-31-2000  . Knee arthroscopy w/ meniscectomy  07-27-2011    MEDIAL AND LATERAL  . Transthoracic echocardiogram  05-27-2011  DR CROITORU    MODERATELY DEPRESSED LVF DUE TO GLOBAL HYPOKINESIS AND MARKED SYSTOLIC  ASYNCHRONY/ EF 30%/ MILD LEFT ATRIAL DILATATION  . Cardiac catheterization  09-04-1999;  08/25/2004;   12/09/2009  DR CROITORU    NORMAL CORONARIES/  APICAL BALLOONING OF LV CONSISTENT WITH TAKOTSUBO SYMPTOMS/ EF 30-35%  . Pubovaginal sling N/A 09/19/2012    Procedure: SUBURETHRAL Janyth Pupa;  Surgeon: Bernestine Amass, MD;  Location: [facility];  Service: Urology;  Laterality: N/A;  . Cystoscopy N/A 09/19/2012    Procedure: CYSTOSCOPY FLEXIBLE;  Surgeon: Bernestine Amass, MD;  Location: [facility];  Service: Urology;  Laterality: N/A;  . Cholecystectomy N/A 09/29/2012    Procedure: LAPAROSCOPIC CHOLECYSTECTOMY WITH INTRAOPERATIVE CHOLANGIOGRAM;  Surgeon: Adin Hector, MD;  Location: [facility];  Service: General;  Laterality: N/A;    There were no vitals filed for this visit.  Visit Diagnosis:  Low back pain without sciatica, unspecified back pain laterality  Unsteadiness  Abnormality of gait      Subjective Assessment - 09/25/15 1500    Subjective Pt states she wants to increase flexibility and get to where she can move easier;  "can we work on some back exercises?"   Pertinent History hypoglycemia, LBBB, depression, anxiety, panic attacks, nonischemic dilated cardiomyopathy, HTN, OSA, chronic LBP   Patient Stated Goals improve balance   Currently in Pain? Yes   Pain Score 2    Pain Location  Back   Pain Orientation Lower   Pain Descriptors / Indicators Aching;Discomfort   Pain Type Chronic pain   Pain Onset More than a month ago   Pain Frequency Intermittent                         [facility] Adult PT Treatment/Exercise - 09/25/15 0001    Lumbar Exercises: Stretches   Active Hamstring Stretch 1 rep;20 seconds   Single Knee to Chest Stretch 1 rep;20 seconds   Double Knee to Chest Stretch 1 rep;20 seconds   Lower Trunk Rotation 1 rep;30 seconds   Pelvic Tilt 5 reps   Standing Extension 1 rep   Lumbar Exercises: Aerobic   Stationary Bike  SciFit level 1.5 x 9"  with UE's & LE's   Lumbar Exercises: Standing   Functional Squats 10 reps   Lumbar Exercises: Supine   Ab Set 5 reps   Bridge 10 reps   Other Supine Lumbar Exercises Pt performed bridging with hip abduciton/adduction, bridging with marching and bridging with LE extension x 10 reps each     Pelvic rocking x 5 reps in seated position Sit to stand x 5 reps without UE support; added partial squats with 2-3 sec hold x 5 reps rather than wall squats as in back packet  Pt performed heel cord stretch on each leg - used bottom shelf - 30 sec hold each leg Runner's stretch 30 sec hold in standing each leg            PT Education - 09/25/15 1507    Education provided Yes   Education Details back exercises packet   Person(s) Educated Patient   Methods Explanation;Demonstration;Handout   Comprehension Verbalized understanding;Returned demonstration             PT Long Term Goals - 09/18/15 2054    PT LONG TERM GOAL #1   Title Incr. gait velocity to >/= 4.0 ft/sec for incr. gait efficiency.  (10-18-15)   Baseline 3.71 ft/sec   Time 4   Period Weeks   Status New   PT LONG TERM GOAL #2   Title Incr. SLS on RLE to >/= 5 secs to demo improved balance.  (10-18-15)   Time 4   Period Weeks   Status New   PT LONG TERM GOAL #3   Title Pt will report at least 30% improvement in balance and steadiness with ambulation.  (10-18-15)   Time 4   Period Weeks   Status New   PT LONG TERM GOAL #4   Title Independent in HEP for back and balance exercises.  (10-18-15)   Time 4   Period Weeks   Status New               Plan - 09/25/15 1508    Clinical Impression Statement Pt tolerated back exercises well - had minimal c/o pain while doing exercises; reported minimal fatigue during PT session   Pt will benefit from skilled therapeutic intervention in order to improve on the following deficits Difficulty walking;Abnormal gait;Decreased balance;Decreased  mobility;Decreased strength;Decreased activity tolerance;Pain;Cardiopulmonary status limiting activity   Rehab Potential Good   PT Frequency 2x / week   PT Duration 4 weeks   PT Treatment/Interventions ADLs/Self Care Home Management;Therapeutic exercise;Therapeutic activities;Functional mobility training;Stair training;Gait training;Balance training;Neuromuscular re-education;Patient/family education   PT Next Visit Plan check back exercises; add high level balance exercises to HEP   PT Home Exercise Plan see above   Consulted and  Agree with Plan of Care Patient        Problem List Patient Active Problem List   Diagnosis Date Noted  . Mixed hyperlipidemia 08/23/2014  . Major depressive disorder, recurrent episode, severe ([place]) 09/22/2013  . Vitamin D Deficiency 07/28/2013  . Medication management 07/28/2013  . Prediabetes 07/28/2013  . Obstructive sleep apnea 05/27/2013  . Arrhythmia 04/14/2013  . Palpitations 03/12/2013  . Delayed gastric emptying 10/18/2012  . Steatohepatitis, nonalcoholic 73/71/0626  . Female stress incontinence 09/19/2012  . Takotsubo syndrome, June 2011.(normal coronaries) 09/12/2012  . Cardiomyopathy- EF 45-50% by echo 09/26/12 09/12/2012  . Anxiety disorder  09/12/2012  . HTN (hypertension) 09/12/2012  . LBBB (left bundle branch block) 09/12/2012  . Obesity 09/12/2012  . Sleep apnea- non compliant with C-pap 09/12/2012    Daiveon Markman, [place], PT 09/25/2015, 3:11 PM  [facility] [address] [place], [place], [address] Phone: [phone]   Fax:  [phone]  Name: Laura [name] MRN: [id number] Date of Birth: 1946/06/06

## 2015-10-03 ENCOUNTER — Ambulatory Visit: Payer: Medicare Other | Attending: Family Medicine | Admitting: Physical Therapy

## 2015-10-03 DIAGNOSIS — M545 Low back pain, unspecified: Secondary | ICD-10-CM

## 2015-10-03 DIAGNOSIS — M6281 Muscle weakness (generalized): Secondary | ICD-10-CM | POA: Diagnosis not present

## 2015-10-03 DIAGNOSIS — R2689 Other abnormalities of gait and mobility: Secondary | ICD-10-CM | POA: Insufficient documentation

## 2015-10-03 DIAGNOSIS — R252 Cramp and spasm: Secondary | ICD-10-CM | POA: Diagnosis not present

## 2015-10-03 NOTE — Patient Instructions (Signed)
FLEXION: Sitting - Resistance Band (Active)    Sit with right leg extended. Against yellow resistance band, bend knee and draw foot backward. Complete _1__ sets of 10__ repetitions. Perform _1-2__ sessions per day.  http://gtsc.exer.us/231   Copyright  VHI. All rights reserved.  EXTENSION: Sitting - Resistance Band (Active)    Sit with feet flat. Against yellow resistance band, straighten right knee. Complete __1_ sets of _10__ repetitions. Perform _1-2__ sessions per day.  Copyright  VHI. All rights reserved.

## 2015-10-04 DIAGNOSIS — D485 Neoplasm of uncertain behavior of skin: Secondary | ICD-10-CM | POA: Diagnosis not present

## 2015-10-04 DIAGNOSIS — F331 Major depressive disorder, recurrent, moderate: Secondary | ICD-10-CM | POA: Diagnosis not present

## 2015-10-05 NOTE — Therapy (Signed)
[facility] [address] [place], [place], [address] Phone: [phone]   Fax:  [phone]  Physical Therapy Treatment  Patient Details  Name: Laura [name] MRN: [id number] Date of Birth: 26-Mar-1946 Referring Provider: Dr. Shirline Frees  Encounter Date: 10/03/2015      PT End of Session - 10/05/15 1808    Visit Number 3   Number of Visits 9   Date for PT Re-Evaluation 10/18/15   Authorization Type Medicare   Authorization Time Period 09-17-15 - 11-16-15   PT Start Time 1104   PT Stop Time 1151   PT Time Calculation (min) 47 min      Past Medical History  Diagnosis Date  . Hypothyroidism   . Anemia   . Seasonal allergies   . Depression   . SUI (stress urinary incontinence, female)   . OSA (obstructive sleep apnea) MODERATE PER STUDY 2005    CPAP NONCOMPLIANT  . History of kidney stones   . Nonischemic dilated cardiomyopathy ([place])     MODERATELY DEPRESSED LVF;EF 35-45% by Echo 05/27/11  . LBBB (left bundle branch block)   . Left ventricular ejection fraction less than 40%     38% PER CARDIOLOGIST NOTE (DR CROITORU)  . Short of breath on exertion   . Chronic combined systolic and diastolic CHF, NYHA class 2 ([place]) CARDIOLOGIST-  DR [id number]  . Hypertension   . History of non-ST elevation myocardial infarction (NSTEMI) JUNE 2011    SECONDARY TO TAKOTSUDO SYNDROME (CARDIAC CATH NORMAL)  . Hyperlipemia   . Anxiety   . Asthma     related to sesonal allergies    Past Surgical History  Procedure Laterality Date  . Nasal septum surgery  1980's  . Abdominal hysterectomy  1985    partial  . Cataract extraction w/ intraocular lens  implant, bilateral    . Right ureteroscopic stone extraction  08-31-2000  . Knee arthroscopy w/ meniscectomy  07-27-2011    MEDIAL AND LATERAL  . Transthoracic echocardiogram  05-27-2011  DR CROITORU    MODERATELY DEPRESSED LVF DUE TO GLOBAL HYPOKINESIS AND MARKED SYSTOLIC  ASYNCHRONY/ EF 42%/ MILD LEFT ATRIAL DILATATION  . Cardiac catheterization  09-04-1999;  08/25/2004;   12/09/2009  DR CROITORU    NORMAL CORONARIES/  APICAL BALLOONING OF LV CONSISTENT WITH TAKOTSUBO SYMPTOMS/ EF 30-35%  . Pubovaginal sling N/A 09/19/2012    Procedure: SUBURETHRAL Janyth Pupa;  Surgeon: Bernestine Amass, MD;  Location: [facility];  Service: Urology;  Laterality: N/A;  . Cystoscopy N/A 09/19/2012    Procedure: CYSTOSCOPY FLEXIBLE;  Surgeon: Bernestine Amass, MD;  Location: [facility];  Service: Urology;  Laterality: N/A;  . Cholecystectomy N/A 09/29/2012    Procedure: LAPAROSCOPIC CHOLECYSTECTOMY WITH INTRAOPERATIVE CHOLANGIOGRAM;  Surgeon: Adin Hector, MD;  Location: [facility];  Service: General;  Laterality: N/A;    There were no vitals filed for this visit.      Subjective Assessment - 10/05/15 1759    Subjective Pt states she did the back exercises at home - has a couple of questions about 2 different exercises given but feels that they are helping overall   Pertinent History hypoglycemia, LBBB, depression, anxiety, panic attacks, nonischemic dilated cardiomyopathy, HTN, OSA, chronic LBP   Patient Stated Goals improve balance   Currently in Pain? Yes   Pain Score 2    Pain Location Back   Pain Orientation Lower   Pain Descriptors / Indicators Aching;Discomfort  Pain Type Chronic pain   Pain Onset More than a month ago   Pain Frequency Intermittent   Aggravating Factors  weight bearing   Pain Relieving Factors lying down   Multiple Pain Sites No                         [facility] Adult PT Treatment/Exercise - 10/05/15 0001    Exercises   Exercises Knee/Hip;Lumbar   Lumbar Exercises: Aerobic   Stationary Bike SciFit level 1.5 x 5"  with UE's & LE's   Lumbar Exercises: Standing   Heel Raises 10 reps   Functional Squats 10 reps   Lumbar Exercises: Seated   Sit to Stand 10 reps   Lumbar Exercises: Supine   Bridge 10 reps    Lumbar Exercises: Sidelying   Hip Abduction 10 reps   Knee/Hip Exercises: Standing   Hip Extension Stengthening;Both;10 reps  red theraband   Knee/Hip Exercises: Seated   Long Arc Quad Strengthening;Both;1 set;10 reps  green theraband   Hamstring Curl Strengthening;Both;1 set;10 reps  green theraband     Pt also performed hamstring stretch for each LE- 30 sec hold in seated position; runner's stretch for each leg in standing  30 sec hold each LE; heel cord stretch in standing with 2" block 30 sec hold - added these stretches to HEP  Hip abduction performed in hooklying with green theraband x 10 reps each leg         PT Education - 10/05/15 1807    Education provided Yes   Education Details see pt instructions   Person(s) Educated Patient   Methods Explanation;Demonstration;Handout   Comprehension Verbalized understanding;Returned demonstration             PT Long Term Goals - 09/18/15 2054    PT LONG TERM GOAL #1   Title Incr. gait velocity to >/= 4.0 ft/sec for incr. gait efficiency.  (10-18-15)   Baseline 3.71 ft/sec   Time 4   Period Weeks   Status New   PT LONG TERM GOAL #2   Title Incr. SLS on RLE to >/= 5 secs to demo improved balance.  (10-18-15)   Time 4   Period Weeks   Status New   PT LONG TERM GOAL #3   Title Pt will report at least 30% improvement in balance and steadiness with ambulation.  (10-18-15)   Time 4   Period Weeks   Status New   PT LONG TERM GOAL #4   Title Independent in HEP for back and balance exercises.  (10-18-15)   Time 4   Period Weeks   Status New               Plan - 10/05/15 1808    Clinical Impression Statement Pt c/o cramps in hamstrings with SLR exercise - instructed in stretches to help relieve tightness; pt tolerated PRE's well with use of red theraband   Rehab Potential Good   PT Frequency 2x / week   PT Duration 4 weeks   PT Treatment/Interventions ADLs/Self Care Home Management;Therapeutic  exercise;Therapeutic activities;Functional mobility training;Stair training;Gait training;Balance training;Neuromuscular re-education;Patient/family education   PT Next Visit Plan check PRE's with red theraband and stretches added to HEP - begin high level balance activities   PT Home Exercise Plan see above   Consulted and Agree with Plan of Care Patient      Patient will benefit from skilled therapeutic intervention in order to improve the following deficits and  impairments:  Difficulty walking, Abnormal gait, Decreased balance, Decreased mobility, Decreased strength, Decreased activity tolerance, Pain, Cardiopulmonary status limiting activity  Visit Diagnosis: Muscle weakness (generalized) - Plan: PT plan of care cert/re-cert  Other abnormalities of gait and mobility - Plan: PT plan of care cert/re-cert  Cramp and spasm - Plan: PT plan of care cert/re-cert  Midline low back pain without sciatica - Plan: PT plan of care cert/re-cert     Problem List Patient Active Problem List   Diagnosis Date Noted  . Mixed hyperlipidemia 08/23/2014  . Major depressive disorder, recurrent episode, severe ([place]) 09/22/2013  . Vitamin D Deficiency 07/28/2013  . Medication management 07/28/2013  . Prediabetes 07/28/2013  . Obstructive sleep apnea 05/27/2013  . Arrhythmia 04/14/2013  . Palpitations 03/12/2013  . Delayed gastric emptying 10/18/2012  . Steatohepatitis, nonalcoholic 49/82/6415  . Female stress incontinence 09/19/2012  . Takotsubo syndrome, June 2011.(normal coronaries) 09/12/2012  . Cardiomyopathy- EF 45-50% by echo 09/26/12 09/12/2012  . Anxiety disorder  09/12/2012  . HTN (hypertension) 09/12/2012  . LBBB (left bundle branch block) 09/12/2012  . Obesity 09/12/2012  . Sleep apnea- non compliant with C-pap 09/12/2012    Ajdin Macke, [place], PT 10/05/2015, 6:28 PM  [facility] [address] [place], [place],  [address] Phone: [phone]   Fax:  [phone]  Name: ANTONELLA UPSON MRN: [id number] Date of Birth: Aug 28, 1945

## 2015-10-07 DIAGNOSIS — D2321 Other benign neoplasm of skin of right ear and external auricular canal: Secondary | ICD-10-CM | POA: Diagnosis not present

## 2015-10-08 DIAGNOSIS — F332 Major depressive disorder, recurrent severe without psychotic features: Secondary | ICD-10-CM | POA: Diagnosis not present

## 2015-10-09 ENCOUNTER — Encounter: Payer: Self-pay | Admitting: Cardiology

## 2015-10-09 DIAGNOSIS — R110 Nausea: Secondary | ICD-10-CM | POA: Diagnosis not present

## 2015-10-09 DIAGNOSIS — R140 Abdominal distension (gaseous): Secondary | ICD-10-CM | POA: Diagnosis not present

## 2015-10-09 DIAGNOSIS — R102 Pelvic and perineal pain: Secondary | ICD-10-CM | POA: Diagnosis not present

## 2015-10-11 DIAGNOSIS — Z5189 Encounter for other specified aftercare: Secondary | ICD-10-CM | POA: Diagnosis not present

## 2015-10-14 ENCOUNTER — Ambulatory Visit (INDEPENDENT_AMBULATORY_CARE_PROVIDER_SITE_OTHER): Payer: Medicare Other | Admitting: Neurology

## 2015-10-14 ENCOUNTER — Encounter: Payer: Self-pay | Admitting: Neurology

## 2015-10-14 VITALS — BP 132/76 | HR 88 | Ht 63.5 in | Wt 181.0 lb

## 2015-10-14 DIAGNOSIS — R413 Other amnesia: Secondary | ICD-10-CM | POA: Diagnosis not present

## 2015-10-14 DIAGNOSIS — F411 Generalized anxiety disorder: Secondary | ICD-10-CM

## 2015-10-14 NOTE — Progress Notes (Signed)
PATIENT: Laura [name] DOB: 12-16-1945  Chief Complaint  Patient presents with  . Memory Loss    MMSE - animals.  She has noticed a decline in her memory since 2012, after her mother passed away.  Also, states she has had worsening of her mood swings and anxiety for the last six months.  She is currently under the care of [facility].     HISTORICAL  Laura [name] is a 70 years old right handed female, seen in refer by her primary care physician Dr. [place] Kingfisher for evaluation of memory loss In October 14 2015  I reviewed and summarized her primary care referring note, she had a history of hypertension, hypothyroidism, history of coronary artery disease, seasonal allergy, asthma, severe obstructive sleep apnea, but could not tolerate CPAP machine because of claustrophobia, she is taking Nuvigil 250 mg every morning, which has been very helpful, she also had a history of anxiety disorder, had a history of kidney stone,  Laboratory evaluation in April 2017 showed normal free T4, LDL was 86, cholesterol was 169, normal CBC, with hemoglobin of 12.3, normal CMP, glucose level was 98, creatinine level was 0.70, normal TSH  She complains of memory trouble since 2012, she had experienced anxiety after her mother passed away, recently experienced a lot of sibling stress, she complains of anxiety, mind racing fast, focus on certain idea, difficulty getting it off her mind, difficulty sleeping, get agitated easily, worsening memory trouble since January 2017, she would forget what she has done yesterday, trouble sleeping, lost appetite  She had 13 years of education, used to work as a Pensions consultant, since retirement, she remaining active, independent in her daily activity, she is driving without getting lost.  She denied family history of dementia    REVIEW OF SYSTEMS: Full 14 system review of systems performed and notable only for: Circled positive on every system of  reviewing, fever, chill, weight gain, fatigue, palpitation, ringing in ear, itching, blurry vision, eye pain, shortness of breath, cough, snoring, incontinence, constipation, anemia, increased thirst, joint pain, cramps, allergies, runny nose, frequent infection, memory loss, confusion, numbness, weakness, snoring, restless leg, anxiety, too much sleep, decreased appetite, disinteresting activities, hallucinations, racing thoughts  ALLERGIES: Allergies  Allergen Reactions  . Codeine Anaphylaxis, Hives and Other (See Comments)    Headache. Daughter reported that it caused her throat to swell up   . Ace Inhibitors Other (See Comments)    Unknown- it "didn't agree with her"   . Citalopram     Fatigue  . Fetzima [Levomilnacipran] Other (See Comments)    "Talking out of my head"  . Lasix [Furosemide] Other (See Comments)    HEADACHE  . Nsaids Other (See Comments)    Told not to take NSAIDs because of her heart   . Xanax Starla Link Er] Other (See Comments)    confusion    HOME MEDICATIONS: Current Outpatient Prescriptions  Medication Sig Dispense Refill  . albuterol (PROVENTIL HFA;VENTOLIN HFA) 108 (90 BASE) MCG/ACT inhaler Inhale 2 puffs into the lungs as needed.     Marland Kitchen atorvastatin (LIPITOR) 80 MG tablet TAKE 1/2 TABLET BY MOUTH DAILY FOR CHOLESTEROL 45 tablet 3  . bumetanide (BUMEX) 1 MG tablet ALTERNATE ONE TABLET WITH TWO TABLETS 135 tablet 3  . cetirizine (ZYRTEC) 10 MG tablet Take 10 mg by mouth as needed. Take 1/2 tab daily    . levothyroxine (SYNTHROID, LEVOTHROID) 75 MCG tablet TAKE 1 TABLET BY  MOUTH EVERY DAY 90 tablet 0  . LORazepam (ATIVAN) 1 MG tablet Take 1 mg by mouth as needed for anxiety.    Marland Kitchen losartan (COZAAR) 100 MG tablet Take 1 tablet (100 mg total) by mouth daily. 90 tablet 3  . metoprolol succinate (TOPROL-XL) 50 MG 24 hr tablet Take 1 tablet (50 mg total) by mouth every morning. 90 tablet 3  . NUVIGIL 250 MG tablet Take 250 mg by mouth as needed.  3  . potassium  chloride (KLOR-CON M10) 10 MEQ tablet Take 1 tablet (10 mEq total) by mouth 2 (two) times daily. 180 tablet 3   No current facility-administered medications for this visit.    PAST MEDICAL HISTORY: Past Medical History  Diagnosis Date  . Hypothyroidism   . Anemia   . Seasonal allergies   . Depression   . SUI (stress urinary incontinence, female)   . OSA (obstructive sleep apnea) MODERATE PER STUDY 2005    CPAP NONCOMPLIANT  . History of kidney stones   . Nonischemic dilated cardiomyopathy ([place])     MODERATELY DEPRESSED LVF;EF 35-45% by Echo 05/27/11  . LBBB (left bundle branch block)   . Left ventricular ejection fraction less than 40%     38% PER CARDIOLOGIST NOTE (DR CROITORU)  . Chronic combined systolic and diastolic CHF, NYHA class 2 ([place]) CARDIOLOGIST-  DR [id number]  . Hypertension   . History of non-ST elevation myocardial infarction (NSTEMI) JUNE 2011    SECONDARY TO TAKOTSUDO SYNDROME (CARDIAC CATH NORMAL)  . Hyperlipemia   . Anxiety   . Asthma     related to sesonal allergies  . Mood swings ([place])   . Memory loss   . Hypoglycemia     PAST SURGICAL HISTORY: Past Surgical History  Procedure Laterality Date  . Nasal septum surgery  1980's  . Abdominal hysterectomy  1985    partial  . Cataract extraction w/ intraocular lens  implant, bilateral    . Right ureteroscopic stone extraction  08-31-2000  . Knee arthroscopy w/ meniscectomy  07-27-2011    MEDIAL AND LATERAL  . Transthoracic echocardiogram  05-27-2011  DR CROITORU    MODERATELY DEPRESSED LVF DUE TO GLOBAL HYPOKINESIS AND MARKED SYSTOLIC ASYNCHRONY/ EF 25%/ MILD LEFT ATRIAL DILATATION  . Cardiac catheterization  09-04-1999;  08/25/2004;   12/09/2009  DR CROITORU    NORMAL CORONARIES/  APICAL BALLOONING OF LV CONSISTENT WITH TAKOTSUBO SYMPTOMS/ EF 30-35%  . Pubovaginal sling N/A 09/19/2012    Procedure: SUBURETHRAL Janyth Pupa;  Surgeon: Bernestine Amass, MD;  Location: [facility];  Service:  Urology;  Laterality: N/A;  . Cystoscopy N/A 09/19/2012    Procedure: CYSTOSCOPY FLEXIBLE;  Surgeon: Bernestine Amass, MD;  [facility]: [facility];  Service: Urology;  Laterality: N/A;  . Cholecystectomy N/A 09/29/2012    Procedure: LAPAROSCOPIC CHOLECYSTECTOMY WITH INTRAOPERATIVE CHOLANGIOGRAM;  Surgeon: Adin Hector, MD;  Location: [facility];  Service: General;  Laterality: N/A;    FAMILY HISTORY: Family History  Problem Relation Age of Onset  . Pneumonia Mother   . Hypertension Mother   . Heart attack Father   . Fibromyalgia Brother   . Pulmonary embolism Brother   . Hypertension Brother   . Heart attack Paternal Grandfather     SOCIAL HISTORY:  Social History   Social History  . Marital Status: Married    Spouse Name: N/A  . Number of Children: 2  . Years of Education: 12+   Occupational History  .  Retired    Social History Main Topics  . Smoking status: Never Smoker   . Smokeless tobacco: Never Used  . Alcohol Use: No  . Drug Use: No  . Sexual Activity: No   Other Topics Concern  . Not on file   Social History Narrative   Lives at home with husband.   Right-handed.   Drinks 2-3 cups caffeine per day.     PHYSICAL EXAM   Filed Vitals:   10/14/15 1535  BP: 132/76  Pulse: 88  Height: 5' 3.5" (1.613 m)  Weight: 181 lb (82.101 kg)    Not recorded      Body mass index is 31.56 kg/(m^2).  PHYSICAL EXAMNIATION:  Gen: NAD, conversant, well nourised, obese, well groomed                     Cardiovascular: Regular rate rhythm, no peripheral edema, warm, nontender. Eyes: Conjunctivae clear without exudates or hemorrhage Neck: Supple, no carotid bruise. Pulmonary: Clear to auscultation bilaterally   NEUROLOGICAL EXAM:  MENTAL STATUS:Anxious looking elderly female Speech:    Speech is normal; fluent and spontaneous with normal comprehension.  Cognition: Mini-Mental Status Examination 29/30         Orientation to time, place and person: She  is not oriented to clinic     Normal recent and remote memory     Normal Attention span and concentration     Normal Language, naming, repeating,spontaneous speech     Fund of knowledge   CRANIAL NERVES: CN II: Visual fields are full to confrontation. Fundoscopic exam is normal with sharp discs and no vascular changes. Pupils are round equal and briskly reactive to light. CN III, IV, VI: extraocular movement are normal. No ptosis. CN V: Facial sensation is intact to pinprick in all 3 divisions bilaterally. Corneal responses are intact.  CN VII: Face is symmetric with normal eye closure and smile. CN VIII: Hearing is normal to rubbing fingers CN IX, X: Palate elevates symmetrically. Phonation is normal. CN XI: Head turning and shoulder shrug are intact CN XII: Tongue is midline with normal movements and no atrophy.  MOTOR: There is no pronator drift of out-stretched arms. Muscle bulk and tone are normal. Muscle strength is normal.  REFLEXES: Reflexes are 2+ and symmetric at the biceps, triceps, knees, and ankles. Plantar responses are flexor.  SENSORY: Intact to light touch, pinprick, positional sensation and vibratory sensation are intact in fingers and toes.  COORDINATION: Rapid alternating movements and fine finger movements are intact. There is no dysmetria on finger-to-nose and heel-knee-shin.    GAIT/STANCE: Posture is normal. Gait is steady with normal steps, base, arm swing, and turning. Heel and toe walking are normal. Tandem gait is normal.  Romberg is absent.   DIAGNOSTIC DATA (LABS, IMAGING, TESTING) - I reviewed patient records, labs, notes, testing and imaging myself where available.   ASSESSMENT AND PLAN  YANICE MAQUEDA is a 70 y.o. female    Memory loss  Mini-Mental Status Examination is 29/30,  Her anxiety likely plays a major role in her complains, I have suggested her continue follow-up with her primary care for the treatment of anxiety  MRI of the brain  without contrast  Check vitamin B12 level   Marcial Pacas, M.D. Ph.D.  [facility] [address] [place], [place] [address] Ph: [phone] Fax: [phone]  CC: Shirline Frees, MD

## 2015-10-15 DIAGNOSIS — F39 Unspecified mood [affective] disorder: Secondary | ICD-10-CM | POA: Diagnosis not present

## 2015-10-15 DIAGNOSIS — F331 Major depressive disorder, recurrent, moderate: Secondary | ICD-10-CM | POA: Diagnosis not present

## 2015-10-15 LAB — VITAMIN B12: Vitamin B-12: 566 pg/mL (ref 211–946)

## 2015-10-17 ENCOUNTER — Ambulatory Visit: Payer: Medicare Other | Admitting: Physical Therapy

## 2015-10-17 DIAGNOSIS — R2689 Other abnormalities of gait and mobility: Secondary | ICD-10-CM | POA: Diagnosis not present

## 2015-10-17 DIAGNOSIS — R252 Cramp and spasm: Secondary | ICD-10-CM | POA: Diagnosis not present

## 2015-10-17 DIAGNOSIS — M6281 Muscle weakness (generalized): Secondary | ICD-10-CM | POA: Diagnosis not present

## 2015-10-17 DIAGNOSIS — M545 Low back pain: Secondary | ICD-10-CM | POA: Diagnosis not present

## 2015-10-20 NOTE — Therapy (Signed)
[facility] [address] [place], [place], [address] Phone: [phone]   Fax:  [phone]  Physical Therapy Treatment  Patient Details  Name: Laura [name] MRN: [id number] Date of Birth: 03-07-1946 Referring Provider: Dr. Shirline Frees  Encounter Date: 10/17/2015      PT End of Session - 10/20/15 2206    Visit Number 4   Number of Visits 9   Date for PT Re-Evaluation 10/18/15   Authorization Type Medicare   Authorization Time Period 09-17-15 - 11-16-15   PT Start Time 1103   PT Stop Time 1150   PT Time Calculation (min) 47 min      Past Medical History  Diagnosis Date  . Hypothyroidism   . Anemia   . Seasonal allergies   . Depression   . SUI (stress urinary incontinence, female)   . OSA (obstructive sleep apnea) MODERATE PER STUDY 2005    CPAP NONCOMPLIANT  . History of kidney stones   . Nonischemic dilated cardiomyopathy ([place])     MODERATELY DEPRESSED LVF;EF 35-45% by Echo 05/27/11  . LBBB (left bundle branch block)   . Left ventricular ejection fraction less than 40%     38% PER CARDIOLOGIST NOTE (DR CROITORU)  . Chronic combined systolic and diastolic CHF, NYHA class 2 ([place]) CARDIOLOGIST-  DR IRCVELFY  . Hypertension   . History of non-ST elevation myocardial infarction (NSTEMI) JUNE 2011    SECONDARY TO TAKOTSUDO SYNDROME (CARDIAC CATH NORMAL)  . Hyperlipemia   . Anxiety   . Asthma     related to sesonal allergies  . Mood swings ([place])   . Memory loss   . Hypoglycemia     Past Surgical History  Procedure Laterality Date  . Nasal septum surgery  1980's  . Abdominal hysterectomy  1985    partial  . Cataract extraction w/ intraocular lens  implant, bilateral    . Right ureteroscopic stone extraction  08-31-2000  . Knee arthroscopy w/ meniscectomy  07-27-2011    MEDIAL AND LATERAL  . Transthoracic echocardiogram  05-27-2011  DR CROITORU    MODERATELY DEPRESSED LVF DUE TO GLOBAL HYPOKINESIS  AND MARKED SYSTOLIC ASYNCHRONY/ EF 10%/ MILD LEFT ATRIAL DILATATION  . Cardiac catheterization  09-04-1999;  08/25/2004;   12/09/2009  DR CROITORU    NORMAL CORONARIES/  APICAL BALLOONING OF LV CONSISTENT WITH TAKOTSUBO SYMPTOMS/ EF 30-35%  . Pubovaginal sling N/A 09/19/2012    Procedure: SUBURETHRAL Janyth Pupa;  Surgeon: Bernestine Amass, MD;  Location: [facility];  Service: Urology;  Laterality: N/A;  . Cystoscopy N/A 09/19/2012    Procedure: CYSTOSCOPY FLEXIBLE;  Surgeon: Bernestine Amass, MD;  Location: [facility];  Service: Urology;  Laterality: N/A;  . Cholecystectomy N/A 09/29/2012    Procedure: LAPAROSCOPIC CHOLECYSTECTOMY WITH INTRAOPERATIVE CHOLANGIOGRAM;  Surgeon: Adin Hector, MD;  Location: [facility];  Service: General;  Laterality: N/A;    There were no vitals filed for this visit.      Subjective Assessment - 10/20/15 2203    Subjective Pt reports she is moving much better   Pertinent History hypoglycemia, LBBB, depression, anxiety, panic attacks, nonischemic dilated cardiomyopathy, HTN, OSA, chronic LBP   Patient Stated Goals improve balance   Currently in Pain? No/denies                         [facility] Adult PT Treatment/Exercise - 10/20/15 0001    Lumbar Exercises: Stretches  Active Hamstring Stretch 1 rep;20 seconds   Lumbar Exercises: Aerobic   Stationary Bike SciFit level 1.5 x 5"  with UE's & LE's   Lumbar Exercises: Machines for Strengthening   Leg Press 50# 3 sets 10 reps bil. LE's   Lumbar Exercises: Standing   Heel Raises 10 reps   Functional Squats 10 reps   Lumbar Exercises: Seated   Sit to Stand 10 reps   Knee/Hip Exercises: Seated   Long Arc Quad Strengthening;Both;1 set;10 reps  green theraband   Hamstring Curl Strengthening;Both;1 set;10 reps  green theraband      Bil. Hip flexion, abdct., and extension with red theraband x 10 reps each in standing                 PT Long Term Goals - 09/18/15  2054    PT LONG TERM GOAL #1   Title Incr. gait velocity to >/= 4.0 ft/sec for incr. gait efficiency.  (10-18-15)   Baseline 3.71 ft/sec   Time 4   Period Weeks   Status New   PT LONG TERM GOAL #2   Title Incr. SLS on RLE to >/= 5 secs to demo improved balance.  (10-18-15)   Time 4   Period Weeks   Status New   PT LONG TERM GOAL #3   Title Pt will report at least 30% improvement in balance and steadiness with ambulation.  (10-18-15)   Time 4   Period Weeks   Status New   PT LONG TERM GOAL #4   Title Independent in HEP for back and balance exercises.  (10-18-15)   Time 4   Period Weeks   Status New               Plan - 10/20/15 2207    Clinical Impression Statement Pt progressing well towards LTG's - plan D/C next session   Rehab Potential Good   PT Frequency 2x / week   PT Duration 4 weeks   PT Treatment/Interventions ADLs/Self Care Home Management;Therapeutic exercise;Therapeutic activities;Functional mobility training;Stair training;Gait training;Balance training;Neuromuscular re-education;Patient/family education   PT Next Visit Plan check PRE's with red theraband and stretches added to HEP - begin high level balance activities; check LTG's   PT Home Exercise Plan see above      Patient will benefit from skilled therapeutic intervention in order to improve the following deficits and impairments:  Difficulty walking, Abnormal gait, Decreased balance, Decreased mobility, Decreased strength, Decreased activity tolerance, Pain, Cardiopulmonary status limiting activity  Visit Diagnosis: Other abnormalities of gait and mobility  Muscle weakness (generalized)     Problem List Patient Active Problem List   Diagnosis Date Noted  . Memory loss 10/14/2015  . Mixed hyperlipidemia 08/23/2014  . Major depressive disorder, recurrent episode, severe ([place]) 09/22/2013  . Vitamin D Deficiency 07/28/2013  . Medication management 07/28/2013  . Prediabetes 07/28/2013  .  Obstructive sleep apnea 05/27/2013  . Arrhythmia 04/14/2013  . Palpitations 03/12/2013  . Delayed gastric emptying 10/18/2012  . Steatohepatitis, nonalcoholic 44/31/5400  . Female stress incontinence 09/19/2012  . Takotsubo syndrome, June 2011.(normal coronaries) 09/12/2012  . Cardiomyopathy- EF 45-50% by echo 09/26/12 09/12/2012  . Anxiety disorder  09/12/2012  . HTN (hypertension) 09/12/2012  . LBBB (left bundle branch block) 09/12/2012  . Obesity 09/12/2012  . Sleep apnea- non compliant with C-pap 09/12/2012    Vin Yonke, [place], PT 10/20/2015, 10:09 PM  [facility] [address] [place], [place], [address] Phone: [phone]  Fax:  [phone]  Name: TIKA HANNIS MRN: [id number] Date of Birth: 09-11-1945

## 2015-10-22 DIAGNOSIS — I447 Left bundle-branch block, unspecified: Secondary | ICD-10-CM | POA: Diagnosis not present

## 2015-10-22 DIAGNOSIS — I428 Other cardiomyopathies: Secondary | ICD-10-CM | POA: Diagnosis not present

## 2015-10-22 DIAGNOSIS — E039 Hypothyroidism, unspecified: Secondary | ICD-10-CM | POA: Diagnosis not present

## 2015-10-22 DIAGNOSIS — I10 Essential (primary) hypertension: Secondary | ICD-10-CM | POA: Diagnosis not present

## 2015-10-22 DIAGNOSIS — E668 Other obesity: Secondary | ICD-10-CM | POA: Diagnosis not present

## 2015-10-22 DIAGNOSIS — I2510 Atherosclerotic heart disease of native coronary artery without angina pectoris: Secondary | ICD-10-CM | POA: Diagnosis not present

## 2015-10-22 DIAGNOSIS — G4733 Obstructive sleep apnea (adult) (pediatric): Secondary | ICD-10-CM | POA: Diagnosis not present

## 2015-10-22 DIAGNOSIS — E785 Hyperlipidemia, unspecified: Secondary | ICD-10-CM | POA: Diagnosis not present

## 2015-10-22 DIAGNOSIS — N183 Chronic kidney disease, stage 3 (moderate): Secondary | ICD-10-CM | POA: Diagnosis not present

## 2015-10-23 DIAGNOSIS — H61031 Chondritis of right external ear: Secondary | ICD-10-CM | POA: Diagnosis not present

## 2015-10-23 DIAGNOSIS — H61001 Unspecified perichondritis of right external ear: Secondary | ICD-10-CM | POA: Diagnosis not present

## 2015-10-23 DIAGNOSIS — B999 Unspecified infectious disease: Secondary | ICD-10-CM | POA: Diagnosis not present

## 2015-10-24 ENCOUNTER — Ambulatory Visit: Payer: Medicare Other | Admitting: Physical Therapy

## 2015-10-24 DIAGNOSIS — R2689 Other abnormalities of gait and mobility: Secondary | ICD-10-CM | POA: Diagnosis not present

## 2015-10-24 DIAGNOSIS — M545 Low back pain: Secondary | ICD-10-CM | POA: Diagnosis not present

## 2015-10-24 DIAGNOSIS — M6281 Muscle weakness (generalized): Secondary | ICD-10-CM | POA: Diagnosis not present

## 2015-10-24 DIAGNOSIS — R252 Cramp and spasm: Secondary | ICD-10-CM | POA: Diagnosis not present

## 2015-10-25 ENCOUNTER — Encounter (HOSPITAL_COMMUNITY): Payer: Self-pay | Admitting: *Deleted

## 2015-10-25 ENCOUNTER — Emergency Department (HOSPITAL_COMMUNITY)
Admission: EM | Admit: 2015-10-25 | Discharge: 2015-10-25 | Disposition: A | Discharge status: Home / Self Care | Payer: Medicare Other | Attending: Emergency Medicine | Admitting: Emergency Medicine

## 2015-10-25 DIAGNOSIS — Z9889 Other specified postprocedural states: Secondary | ICD-10-CM | POA: Diagnosis not present

## 2015-10-25 DIAGNOSIS — E785 Hyperlipidemia, unspecified: Secondary | ICD-10-CM | POA: Diagnosis not present

## 2015-10-25 DIAGNOSIS — I252 Old myocardial infarction: Secondary | ICD-10-CM | POA: Insufficient documentation

## 2015-10-25 DIAGNOSIS — F411 Generalized anxiety disorder: Secondary | ICD-10-CM | POA: Insufficient documentation

## 2015-10-25 DIAGNOSIS — Z8742 Personal history of other diseases of the female genital tract: Secondary | ICD-10-CM | POA: Diagnosis not present

## 2015-10-25 DIAGNOSIS — I10 Essential (primary) hypertension: Secondary | ICD-10-CM | POA: Diagnosis not present

## 2015-10-25 DIAGNOSIS — F419 Anxiety disorder, unspecified: Secondary | ICD-10-CM | POA: Diagnosis present

## 2015-10-25 DIAGNOSIS — I5042 Chronic combined systolic (congestive) and diastolic (congestive) heart failure: Secondary | ICD-10-CM | POA: Diagnosis not present

## 2015-10-25 DIAGNOSIS — F329 Major depressive disorder, single episode, unspecified: Secondary | ICD-10-CM | POA: Insufficient documentation

## 2015-10-25 DIAGNOSIS — J45909 Unspecified asthma, uncomplicated: Secondary | ICD-10-CM | POA: Insufficient documentation

## 2015-10-25 DIAGNOSIS — R251 Tremor, unspecified: Secondary | ICD-10-CM | POA: Insufficient documentation

## 2015-10-25 DIAGNOSIS — Z87442 Personal history of urinary calculi: Secondary | ICD-10-CM | POA: Diagnosis not present

## 2015-10-25 DIAGNOSIS — G4733 Obstructive sleep apnea (adult) (pediatric): Secondary | ICD-10-CM | POA: Diagnosis not present

## 2015-10-25 DIAGNOSIS — Z862 Personal history of diseases of the blood and blood-forming organs and certain disorders involving the immune mechanism: Secondary | ICD-10-CM | POA: Insufficient documentation

## 2015-10-25 DIAGNOSIS — Z79899 Other long term (current) drug therapy: Secondary | ICD-10-CM | POA: Diagnosis not present

## 2015-10-25 DIAGNOSIS — Z9981 Dependence on supplemental oxygen: Secondary | ICD-10-CM | POA: Insufficient documentation

## 2015-10-25 LAB — COMPREHENSIVE METABOLIC PANEL
ALBUMIN: 4.1 g/dL (ref 3.5–5.0)
ALT: 24 U/L (ref 14–54)
AST: 24 U/L (ref 15–41)
Alkaline Phosphatase: 78 U/L (ref 38–126)
Anion gap: 10 (ref 5–15)
BUN: 13 mg/dL (ref 6–20)
CHLORIDE: 108 mmol/L (ref 101–111)
CO2: 24 mmol/L (ref 22–32)
CREATININE: 0.71 mg/dL (ref 0.44–1.00)
Calcium: 9.9 mg/dL (ref 8.9–10.3)
GFR calc Af Amer: >60 mL/min (ref >60)
GFR calc non Af Amer: >60 mL/min (ref >60)
GLUCOSE: 116 mg/dL — AB (ref 65–99)
Potassium: 3.4 mmol/L — ABNORMAL LOW (ref 3.5–5.1)
Sodium: 142 mmol/L (ref 135–145)
Total Bilirubin: 0.3 mg/dL (ref 0.3–1.2)
Total Protein: 7 g/dL (ref 6.5–8.1)

## 2015-10-25 LAB — CBC
HEMATOCRIT: 40.9 % (ref 36.0–46.0)
Hemoglobin: 13.2 g/dL (ref 12.0–15.0)
MCH: 25.7 pg — ABNORMAL LOW (ref 26.0–34.0)
MCHC: 32.3 g/dL (ref 30.0–36.0)
MCV: 79.7 fL (ref 78.0–100.0)
PLATELETS: 268 10*3/uL (ref 150–400)
RBC: 5.13 MIL/uL — AB (ref 3.87–5.11)
RDW: 14.5 % (ref 11.5–15.5)
WBC: 8.5 10*3/uL (ref 4.0–10.5)

## 2015-10-25 MED ORDER — ACETAMINOPHEN 325 MG PO TABS
650.0000 mg | ORAL_TABLET | Freq: Once | ORAL | Status: AC
  Administered 2015-10-25: 650 mg via ORAL
  Filled 2015-10-25: qty 2

## 2015-10-25 MED ORDER — DIAZEPAM 5 MG PO TABS: 10.0000 mg | ORAL_TABLET | Freq: Two times a day (BID) | ORAL | Status: DC | PRN

## 2015-10-25 NOTE — Discharge Instructions (Signed)
Generalized Anxiety Disorder Generalized anxiety disorder (GAD) is a mental disorder. It interferes with life functions, including relationships, work, and school. GAD is different from normal anxiety, which everyone experiences at some point in their lives in response to specific life events and activities. Normal anxiety actually helps us prepare for and get through these life events and activities. Normal anxiety goes away after the event or activity is over.  GAD causes anxiety that is not necessarily related to specific events or activities. It also causes excess anxiety in proportion to specific events or activities. The anxiety associated with GAD is also difficult to control. GAD can vary from mild to severe. People with severe GAD can have intense waves of anxiety with physical symptoms (panic attacks).  SYMPTOMS The anxiety and worry associated with GAD are difficult to control. This anxiety and worry are related to many life events and activities and also occur more days than not for 6 months or longer. People with GAD also have three or more of the following symptoms (one or more in children):  Restlessness.   Fatigue.  Difficulty concentrating.   Irritability.  Muscle tension.  Difficulty sleeping or unsatisfying sleep. DIAGNOSIS GAD is diagnosed through an assessment by your health care provider. Your health care provider will ask you questions aboutyour mood,physical symptoms, and events in your life. Your health care provider may ask you about your medical history and use of alcohol or drugs, including prescription medicines. Your health care provider may also do a physical exam and blood tests. Certain medical conditions and the use of certain substances can cause symptoms similar to those associated with GAD. Your health care provider may refer you to a mental health specialist for further evaluation. TREATMENT The following therapies are usually used to treat GAD:    Medication. Antidepressant medication usually is prescribed for long-term daily control. Antianxiety medicines may be added in severe cases, especially when panic attacks occur.   Talk therapy (psychotherapy). Certain types of talk therapy can be helpful in treating GAD by providing support, education, and guidance. A form of talk therapy called cognitive behavioral therapy can teach you healthy ways to think about and react to daily life events and activities.  Stress managementtechniques. These include yoga, meditation, and exercise and can be very helpful when they are practiced regularly. A mental health specialist can help determine which treatment is best for you. Some people see improvement with one therapy. However, other people require a combination of therapies.   This information is not intended to replace advice given to you by your health care provider. Make sure you discuss any questions you have with your health care provider.   Document Released: 10/10/2012 Document Revised: 07/06/2014 Document Reviewed: 10/10/2012 Elsevier Interactive Patient Education 2016 [employer]  

## 2015-10-25 NOTE — ED Provider Notes (Signed)
CSN: [id number]     Arrival date & time 10/25/15  1749 History   First MD Initiated Contact with Patient 10/25/15 2037     Chief Complaint  Patient presents with  . Depression  . Anxiety     (Consider location/radiation/quality/duration/timing/severity/associated sxs/prior Treatment) HPI Comments: 70 year old female with a history of depression, anxiety, nonischemic dilated cardiomyopathy, hypertension, and dyslipidemia presents to the emergency department for evaluation of anxiety. Patient states that she has had worsening episodes of anxiety and depression since her mother passed away a few months ago. She states that she saw a therapist who was helping her, so she discontinued care from a: However, symptoms worsened. Patient reports returning to see her therapist who recommended that she also see a psychiatrist. Since seeing this psychiatrist, patient has had multiple medication changes. She states that she has been on 10 mg Valium twice a day since the age of 50 which has adequately manage her anxiety. She was recently changed one week ago from 10 mg Valium tablets to 1 mg Ativan twice a day. Since this time, patient states that she has had increasing anxiety and agitation. She reports getting mad at family members and yelling more frequently. She complains of racing thoughts. She believes that her increasing anxiety has contributed to a headache. She currently complains of a 4/10 occipital headache which is consistent with prior stress-related headaches. She denies taking any medications for symptoms today. She has had no fever, vision loss, hearing loss, difficulty speaking or swallowing, extremity numbness/weakness, or nausea/vomiting. She believes that her symptoms will resolve if placed back on her Valium prescription.  Patient is a 70 y.o. female presenting with depression and anxiety. The history is provided by the patient. No language interpreter was used.  Depression Associated symptoms  include headaches. Pertinent negatives include no nausea, numbness, vomiting or weakness.  Anxiety Associated symptoms include headaches. Pertinent negatives include no nausea, numbness, vomiting or weakness.    Past Medical History  Diagnosis Date  . Hypothyroidism   . Anemia   . Seasonal allergies   . Depression   . SUI (stress urinary incontinence, female)   . OSA (obstructive sleep apnea) MODERATE PER STUDY 2005    CPAP NONCOMPLIANT  . History of kidney stones   . Nonischemic dilated cardiomyopathy ([place])     MODERATELY DEPRESSED LVF;EF 35-45% by Echo 05/27/11  . LBBB (left bundle branch block)   . Left ventricular ejection fraction less than 40%     38% PER CARDIOLOGIST NOTE (DR CROITORU)  . Chronic combined systolic and diastolic CHF, NYHA class 2 ([place]) CARDIOLOGIST-  DR [id number]  . Hypertension   . History of non-ST elevation myocardial infarction (NSTEMI) JUNE 2011    SECONDARY TO TAKOTSUDO SYNDROME (CARDIAC CATH NORMAL)  . Hyperlipemia   . Anxiety   . Asthma     related to sesonal allergies  . Mood swings ([place])   . Memory loss   . Hypoglycemia    Past Surgical History  Procedure Laterality Date  . Nasal septum surgery  1980's  . Abdominal hysterectomy  1985    partial  . Cataract extraction w/ intraocular lens  implant, bilateral    . Right ureteroscopic stone extraction  08-31-2000  . Knee arthroscopy w/ meniscectomy  07-27-2011    MEDIAL AND LATERAL  . Transthoracic echocardiogram  05-27-2011  DR CROITORU    MODERATELY DEPRESSED LVF DUE TO GLOBAL HYPOKINESIS AND MARKED SYSTOLIC ASYNCHRONY/ EF 57%/ MILD LEFT ATRIAL DILATATION  . Cardiac catheterization  09-04-1999;  08/25/2004;   12/09/2009  DR CROITORU    NORMAL CORONARIES/  APICAL BALLOONING OF LV CONSISTENT WITH TAKOTSUBO SYMPTOMS/ EF 30-35%  . Pubovaginal sling N/A 09/19/2012    Procedure: SUBURETHRAL Janyth Pupa;  Surgeon: Bernestine Amass, MD;  Location: [facility];  Service: Urology;   Laterality: N/A;  . Cystoscopy N/A 09/19/2012    Procedure: CYSTOSCOPY FLEXIBLE;  Surgeon: Bernestine Amass, MD;  Location[facility];  Service: Urology;  Laterality: N/A;  . Cholecystectomy N/A 09/29/2012    Procedure: LAPAROSCOPIC CHOLECYSTECTOMY WITH INTRAOPERATIVE CHOLANGIOGRAM;  Surgeon: Adin Hector, MD;  Location: [facility];  Service: General;  Laterality: N/A;   Family History  Problem Relation Age of Onset  . Pneumonia Mother   . Hypertension Mother   . Heart attack Father   . Fibromyalgia Brother   . Pulmonary embolism Brother   . Hypertension Brother   . Heart attack Paternal Grandfather    Social History  Substance Use Topics  . Smoking status: Never Smoker   . Smokeless tobacco: Never Used  . Alcohol Use: No   OB History    No data available      Review of Systems  HENT: Negative for hearing loss.   Eyes: Negative for photophobia.  Gastrointestinal: Negative for nausea and vomiting.  Neurological: Positive for headaches. Negative for weakness and numbness.  Psychiatric/Behavioral: Positive for depression, sleep disturbance and agitation. Negative for suicidal ideas. The patient is nervous/anxious.   All other systems reviewed and are negative.   Allergies  Codeine; Ace inhibitors; Citalopram; Fetzima; Lasix; Nsaids; and Xanax xr  Home Medications   Prior to Admission medications   Medication Sig Start Date End Date Taking? Authorizing Provider  albuterol (PROVENTIL HFA;VENTOLIN HFA) 108 (90 BASE) MCG/ACT inhaler Inhale 2 puffs into the lungs as needed.     Historical Provider, MD  atorvastatin (LIPITOR) 80 MG tablet TAKE 1/2 TABLET BY MOUTH DAILY FOR CHOLESTEROL 04/24/15   Mihai Croitoru, MD  bumetanide (BUMEX) 1 MG tablet ALTERNATE ONE TABLET WITH TWO TABLETS 04/24/15   Mihai Croitoru, MD  cetirizine (ZYRTEC) 10 MG tablet Take 10 mg by mouth as needed. Take 1/2 tab daily    Historical Provider, MD  diazepam (VALIUM) 5 MG tablet Take 2 tablets  (10 mg total) by mouth every 12 (twelve) hours as needed for anxiety. 10/25/15   Antonietta Breach, PA-C  levothyroxine (SYNTHROID, LEVOTHROID) 75 MCG tablet TAKE 1 TABLET BY MOUTH EVERY DAY 10/15/14   Unk Pinto, MD  LORazepam (ATIVAN) 1 MG tablet Take 1 mg by mouth as needed for anxiety.    Historical Provider, MD  losartan (COZAAR) 100 MG tablet Take 1 tablet (100 mg total) by mouth daily. 04/24/15   Mihai Croitoru, MD  metoprolol succinate (TOPROL-XL) 50 MG 24 hr tablet Take 1 tablet (50 mg total) by mouth every morning. 04/24/15   Mihai Croitoru, MD  NUVIGIL 250 MG tablet Take 250 mg by mouth as needed. 06/14/15   Historical Provider, MD  potassium chloride (KLOR-CON M10) 10 MEQ tablet Take 1 tablet (10 mEq total) by mouth 2 (two) times daily. 04/24/15   Mihai Croitoru, MD   BP 148/77 mmHg  Pulse 73  Temp(Src) 98.2 F (36.8 C) (Oral)  Resp 20  SpO2 98%   Physical Exam  Constitutional: She is oriented to person, place, and time. She appears well-developed and well-nourished. No distress.  Nontoxic/nonseptic appearing  HENT:  Head: Normocephalic and atraumatic.  Mouth/Throat: Oropharynx is  clear and moist. No oropharyngeal exudate.  Symmetric rise of the uvula with phonation  Eyes: Conjunctivae and EOM are normal. Pupils are equal, round, and reactive to light. No scleral icterus.  Neck: Normal range of motion.  No nuchal rigidity or meningismus  Cardiovascular: Normal rate, regular rhythm and intact distal pulses.   Pulmonary/Chest: Effort normal. No respiratory distress. She has no wheezes.  Respirations even and unlabored  Musculoskeletal: Normal range of motion.  Neurological: She is alert and oriented to person, place, and time. No cranial nerve deficit. She exhibits normal muscle tone. Coordination normal.  GCS 15. Speech is goal oriented. No cranial nerve deficits appreciated; symmetric eyebrow raise, no facial drooping, tongue midline. Patient has equal grip strength bilaterally  with 5/5 strength against resistance in all major muscle groups bilaterally. Sensation to light touch intact. Reflexes 5/5 in the bilateral lower extremities. Patient moves extremities without ataxia.  Skin: Skin is warm and dry. No rash noted. She is not diaphoretic. No erythema. No pallor.  Psychiatric: Her speech is normal and behavior is normal. Her mood appears anxious.  Nursing note and vitals reviewed.   ED Course  Procedures (including critical care time) Labs Review Labs Reviewed  CBC - Abnormal; Notable for the following:    RBC 5.13 (*)    MCH 25.7 (*)    All other components within normal limits  COMPREHENSIVE METABOLIC PANEL - Abnormal; Notable for the following:    Potassium 3.4 (*)    Glucose, Bld 116 (*)    All other components within normal limits    Imaging Review No results found.   I have personally reviewed and evaluated these images and lab results as part of my medical decision-making.   EKG Interpretation None      MDM   Final diagnoses:  Generalized anxiety disorder    70 year old female percent to the emergency department for evaluation of worsening anxiety with tremors and racing thoughts. She reports recent passing of her mother. The patient was recently changed from 10 mg Valium twice a day to 1 mg Ativan twice a day by her psychiatrist. Patient complaining of a headache consistent with prior headaches associated with stress. She has a nonfocal neurologic exam today. Patient is also afebrile with no nuchal rigidity or meningismus to suggest meningitis. Her laboratory workup is noncontributory. Suspect that her symptoms are secondary to poorly controlled anxiety secondary to recent medication change. Patient has been advised to discontinue Ativan and to restart her Valium which she has been taking since the age of 77. Primary care father advised and return precautions given. Patient discharged in satisfactory condition. Patient and family with no  unaddressed concerns.   Filed Vitals:   10/25/15 1756 10/25/15 2030 10/25/15 2100  BP: 124/100 137/77 148/77  Pulse: 92 68 73  Temp: 98.2 F (36.8 C)    TempSrc: Oral    Resp: '21 22 20  '$ SpO2: 100% 99% 98%     Antonietta Breach, PA-C 10/25/15 2201  Elnora Morrison, MD 10/26/15 [phone]

## 2015-10-25 NOTE — ED Notes (Addendum)
Pt reports being switched from valium to ativan 1 week ago. Now has tremors, headache, anxiety, unable to sleep due to her "mind racing" and increase in depression. Denies SI or HI. Pt tearful at triage.

## 2015-10-25 NOTE — ED Notes (Addendum)
Pt here for increasing anxiousness and tremors and headache since 3 days ago; family reports patient was changed from valium to ativan one week ago and that she wasn't "weaned" off of valium; family requests patient "needs something for depression"; pt holding head and describes headache as stabbing pain and as a 4/10 on the 0-10 pain scale.

## 2015-10-26 NOTE — Therapy (Signed)
[facility] [address] [place], [place], [address] Phone: [phone]   Fax:  [phone]  Physical Therapy Treatment  Patient Details  Name: Laura [name] MRN: [id number] Date of Birth: April 16, 1946 Referring Provider: Dr. Shirline Frees  Encounter Date: 10/24/2015      PT End of Session - 10/26/15 1624    Visit Number 5   Number of Visits 9   Date for PT Re-Evaluation 10/18/15   Authorization Type Medicare   Authorization Time Period 09-17-15 - 11-16-15   PT Start Time 1315   PT Stop Time 1400   PT Time Calculation (min) 45 min      Past Medical History  Diagnosis Date  . Hypothyroidism   . Anemia   . Seasonal allergies   . Depression   . SUI (stress urinary incontinence, female)   . OSA (obstructive sleep apnea) MODERATE PER STUDY 2005    CPAP NONCOMPLIANT  . History of kidney stones   . Nonischemic dilated cardiomyopathy ([place])     MODERATELY DEPRESSED LVF;EF 35-45% by Echo 05/27/11  . LBBB (left bundle branch block)   . Left ventricular ejection fraction less than 40%     38% PER CARDIOLOGIST NOTE (DR CROITORU)  . Chronic combined systolic and diastolic CHF, NYHA class 2 ([place]) CARDIOLOGIST-  DR [id number]  . Hypertension   . History of non-ST elevation myocardial infarction (NSTEMI) JUNE 2011    SECONDARY TO TAKOTSUDO SYNDROME (CARDIAC CATH NORMAL)  . Hyperlipemia   . Anxiety   . Asthma     related to sesonal allergies  . Mood swings ([place])   . Memory loss   . Hypoglycemia     Past Surgical History  Procedure Laterality Date  . Nasal septum surgery  1980's  . Abdominal hysterectomy  1985    partial  . Cataract extraction w/ intraocular lens  implant, bilateral    . Right ureteroscopic stone extraction  08-31-2000  . Knee arthroscopy w/ meniscectomy  07-27-2011    MEDIAL AND LATERAL  . Transthoracic echocardiogram  05-27-2011  DR CROITORU    MODERATELY DEPRESSED LVF DUE TO GLOBAL HYPOKINESIS  AND MARKED SYSTOLIC ASYNCHRONY/ EF 03%/ MILD LEFT ATRIAL DILATATION  . Cardiac catheterization  09-04-1999;  08/25/2004;   12/09/2009  DR CROITORU    NORMAL CORONARIES/  APICAL BALLOONING OF LV CONSISTENT WITH TAKOTSUBO SYMPTOMS/ EF 30-35%  . Pubovaginal sling N/A 09/19/2012    Procedure: SUBURETHRAL Janyth Pupa;  Surgeon: Bernestine Amass, MD;  Location: [facility];  Service: Urology;  Laterality: N/A;  . Cystoscopy N/A 09/19/2012    Procedure: CYSTOSCOPY FLEXIBLE;  Surgeon: Bernestine Amass, MD;  Location: [facility];  Service: Urology;  Laterality: N/A;  . Cholecystectomy N/A 09/29/2012    Procedure: LAPAROSCOPIC CHOLECYSTECTOMY WITH INTRAOPERATIVE CHOLANGIOGRAM;  Surgeon: Adin Hector, MD;  Location: [facility];  Service: General;  Laterality: N/A;    There were no vitals filed for this visit.      Subjective Assessment - 10/26/15 1602    Subjective Pt states she is doing well - ready to discharge   Pertinent History hypoglycemia, LBBB, depression, anxiety, panic attacks, nonischemic dilated cardiomyopathy, HTN, OSA, chronic LBP   Patient Stated Goals improve balance   Currently in Pain? No/denies     Self care; discussed LTG's and progress; reviewed HEP with patient    Neuro Re-ed:  SLS on RLS - 5.16 secs  Gait velocity 7.97 secs = 4.1 ft/sec with  no device  Leg press 50# 3 sets 10 reps - bil. LE's   SciFit level 1.6 x 6" with UE's and LE's                      PT Long Term Goals - 10/26/15 1603    PT LONG TERM GOAL #1   Title Incr. gait velocity to >/= 4.0 ft/sec for incr. gait efficiency.  (10-18-15)   Baseline 7.97 secs= 4.1 secs   Status Achieved   PT LONG TERM GOAL #2   Title Incr. SLS on RLE to >/= 5 secs to demo improved balance.  (10-18-15)   Baseline 5.16 secs on RLE  - 10-24-15   Status Achieved   PT LONG TERM GOAL #3   Title Pt will report at least 30% improvement in balance and steadiness with ambulation.  (10-18-15)    Baseline met 10-24-15   Status Achieved   PT LONG TERM GOAL #4   Title Independent in HEP for back and balance exercises.  (10-18-15)   Baseline met 10-24-15   Status Achieved               Plan - 10/26/15 1625    Clinical Impression Statement Pt has met all LTG's - ready for D/C   Rehab Potential Good   PT Frequency 2x / week   PT Duration 4 weeks   PT Treatment/Interventions ADLs/Self Care Home Management;Therapeutic exercise;Therapeutic activities;Functional mobility training;Stair training;Gait training;Balance training;Neuromuscular re-education;Patient/family education   PT Next Visit Plan D/C   PT Home Exercise Plan see above   Consulted and Agree with Plan of Care Patient      Patient will benefit from skilled therapeutic intervention in order to improve the following deficits and impairments:  Difficulty walking, Abnormal gait, Decreased balance, Decreased mobility, Decreased strength, Decreased activity tolerance, Pain, Cardiopulmonary status limiting activity  Visit Diagnosis: Other abnormalities of gait and mobility     Problem List Patient Active Problem List   Diagnosis Date Noted  . Memory loss 10/14/2015  . Mixed hyperlipidemia 08/23/2014  . Major depressive disorder, recurrent episode, severe ([place]) 09/22/2013  . Vitamin D Deficiency 07/28/2013  . Medication management 07/28/2013  . Prediabetes 07/28/2013  . Obstructive sleep apnea 05/27/2013  . Arrhythmia 04/14/2013  . Palpitations 03/12/2013  . Delayed gastric emptying 10/18/2012  . Steatohepatitis, nonalcoholic 78/29/5621  . Female stress incontinence 09/19/2012  . Takotsubo syndrome, June 2011.(normal coronaries) 09/12/2012  . Cardiomyopathy- EF 45-50% by echo 09/26/12 09/12/2012  . Anxiety disorder  09/12/2012  . HTN (hypertension) 09/12/2012  . LBBB (left bundle branch block) 09/12/2012  . Obesity 09/12/2012  . Sleep apnea- non compliant with C-pap 09/12/2012    PHYSICAL THERAPY DISCHARGE  SUMMARY  Visits from Start of Care: 5  Current functional level related to goals / functional outcomes: See above for progress towards LTG's   Remaining deficits: Very minimal decreased high level balance skills   Education / Equipment: Pt has been instructed in a HEP for balance and low back exercises and for LE strengthening Plan: Patient agrees to discharge.  Patient goals were met. Patient is being discharged due to meeting the stated rehab goals.  ?????       Alda Lea, PT 10/26/2015, 4:27 PM  [facility] [address] [place], [place], [address] Phone: [phone]   Fax:  [phone]  Name: TIMBERLYN PICKFORD MRN: [id number] Date of Birth: [birth year]

## 2015-10-28 DIAGNOSIS — F411 Generalized anxiety disorder: Secondary | ICD-10-CM | POA: Diagnosis not present

## 2015-10-28 DIAGNOSIS — F329 Major depressive disorder, single episode, unspecified: Secondary | ICD-10-CM | POA: Diagnosis not present

## 2015-10-30 DIAGNOSIS — L089 Local infection of the skin and subcutaneous tissue, unspecified: Secondary | ICD-10-CM | POA: Diagnosis not present

## 2015-11-04 DIAGNOSIS — I428 Other cardiomyopathies: Secondary | ICD-10-CM | POA: Diagnosis not present

## 2015-11-13 DIAGNOSIS — T148 Other injury of unspecified body region: Secondary | ICD-10-CM | POA: Diagnosis not present

## 2015-12-03 DIAGNOSIS — E7800 Pure hypercholesterolemia, unspecified: Secondary | ICD-10-CM | POA: Diagnosis not present

## 2015-12-03 DIAGNOSIS — F411 Generalized anxiety disorder: Secondary | ICD-10-CM | POA: Diagnosis not present

## 2015-12-03 DIAGNOSIS — I10 Essential (primary) hypertension: Secondary | ICD-10-CM | POA: Diagnosis not present

## 2015-12-03 DIAGNOSIS — E039 Hypothyroidism, unspecified: Secondary | ICD-10-CM | POA: Diagnosis not present

## 2015-12-03 DIAGNOSIS — G4730 Sleep apnea, unspecified: Secondary | ICD-10-CM | POA: Diagnosis not present

## 2015-12-03 DIAGNOSIS — F329 Major depressive disorder, single episode, unspecified: Secondary | ICD-10-CM | POA: Diagnosis not present

## 2016-01-06 DIAGNOSIS — Z1231 Encounter for screening mammogram for malignant neoplasm of breast: Secondary | ICD-10-CM | POA: Diagnosis not present

## 2016-01-06 DIAGNOSIS — Z01419 Encounter for gynecological examination (general) (routine) without abnormal findings: Secondary | ICD-10-CM | POA: Diagnosis not present

## 2016-01-06 DIAGNOSIS — Z6831 Body mass index (BMI) 31.0-31.9, adult: Secondary | ICD-10-CM | POA: Diagnosis not present

## 2016-01-13 ENCOUNTER — Ambulatory Visit: Payer: Medicare Other | Admitting: Neurology

## 2016-02-14 DIAGNOSIS — E039 Hypothyroidism, unspecified: Secondary | ICD-10-CM | POA: Diagnosis not present

## 2016-02-14 DIAGNOSIS — E785 Hyperlipidemia, unspecified: Secondary | ICD-10-CM | POA: Diagnosis not present

## 2016-02-14 DIAGNOSIS — I10 Essential (primary) hypertension: Secondary | ICD-10-CM | POA: Diagnosis not present

## 2016-02-14 DIAGNOSIS — I2510 Atherosclerotic heart disease of native coronary artery without angina pectoris: Secondary | ICD-10-CM | POA: Diagnosis not present

## 2016-02-14 DIAGNOSIS — I428 Other cardiomyopathies: Secondary | ICD-10-CM | POA: Diagnosis not present

## 2016-02-14 DIAGNOSIS — N183 Chronic kidney disease, stage 3 (moderate): Secondary | ICD-10-CM | POA: Diagnosis not present

## 2016-02-14 DIAGNOSIS — G4733 Obstructive sleep apnea (adult) (pediatric): Secondary | ICD-10-CM | POA: Diagnosis not present

## 2016-02-14 DIAGNOSIS — E668 Other obesity: Secondary | ICD-10-CM | POA: Diagnosis not present

## 2016-02-14 DIAGNOSIS — I447 Left bundle-branch block, unspecified: Secondary | ICD-10-CM | POA: Diagnosis not present

## 2016-02-18 ENCOUNTER — Encounter: Payer: Self-pay | Admitting: Internal Medicine

## 2016-02-18 ENCOUNTER — Other Ambulatory Visit: Payer: Self-pay | Admitting: Internal Medicine

## 2016-02-24 DIAGNOSIS — M1711 Unilateral primary osteoarthritis, right knee: Secondary | ICD-10-CM | POA: Diagnosis not present

## 2016-06-03 DIAGNOSIS — R232 Flushing: Secondary | ICD-10-CM | POA: Diagnosis not present

## 2016-06-03 DIAGNOSIS — E7800 Pure hypercholesterolemia, unspecified: Secondary | ICD-10-CM | POA: Diagnosis not present

## 2016-06-03 DIAGNOSIS — G4730 Sleep apnea, unspecified: Secondary | ICD-10-CM | POA: Diagnosis not present

## 2016-06-03 DIAGNOSIS — I10 Essential (primary) hypertension: Secondary | ICD-10-CM | POA: Diagnosis not present

## 2016-06-03 DIAGNOSIS — E039 Hypothyroidism, unspecified: Secondary | ICD-10-CM | POA: Diagnosis not present

## 2016-06-03 DIAGNOSIS — F329 Major depressive disorder, single episode, unspecified: Secondary | ICD-10-CM | POA: Diagnosis not present

## 2016-07-21 DIAGNOSIS — I10 Essential (primary) hypertension: Secondary | ICD-10-CM | POA: Diagnosis not present

## 2016-07-21 DIAGNOSIS — E039 Hypothyroidism, unspecified: Secondary | ICD-10-CM | POA: Diagnosis not present

## 2016-07-21 DIAGNOSIS — R252 Cramp and spasm: Secondary | ICD-10-CM | POA: Diagnosis not present

## 2016-07-21 DIAGNOSIS — F329 Major depressive disorder, single episode, unspecified: Secondary | ICD-10-CM | POA: Diagnosis not present

## 2016-07-21 DIAGNOSIS — E7800 Pure hypercholesterolemia, unspecified: Secondary | ICD-10-CM | POA: Diagnosis not present

## 2016-08-11 DIAGNOSIS — E039 Hypothyroidism, unspecified: Secondary | ICD-10-CM | POA: Diagnosis not present

## 2016-08-11 DIAGNOSIS — N183 Chronic kidney disease, stage 3 (moderate): Secondary | ICD-10-CM | POA: Diagnosis not present

## 2016-08-11 DIAGNOSIS — G4733 Obstructive sleep apnea (adult) (pediatric): Secondary | ICD-10-CM | POA: Diagnosis not present

## 2016-08-11 DIAGNOSIS — I428 Other cardiomyopathies: Secondary | ICD-10-CM | POA: Diagnosis not present

## 2016-08-11 DIAGNOSIS — I429 Cardiomyopathy, unspecified: Secondary | ICD-10-CM | POA: Diagnosis not present

## 2016-08-11 DIAGNOSIS — E785 Hyperlipidemia, unspecified: Secondary | ICD-10-CM | POA: Diagnosis not present

## 2016-08-11 DIAGNOSIS — E668 Other obesity: Secondary | ICD-10-CM | POA: Diagnosis not present

## 2016-08-11 DIAGNOSIS — I2510 Atherosclerotic heart disease of native coronary artery without angina pectoris: Secondary | ICD-10-CM | POA: Diagnosis not present

## 2016-08-11 DIAGNOSIS — I447 Left bundle-branch block, unspecified: Secondary | ICD-10-CM | POA: Diagnosis not present

## 2016-08-11 DIAGNOSIS — I10 Essential (primary) hypertension: Secondary | ICD-10-CM | POA: Diagnosis not present

## 2016-11-19 DIAGNOSIS — R5383 Other fatigue: Secondary | ICD-10-CM | POA: Diagnosis not present

## 2016-11-19 DIAGNOSIS — G4730 Sleep apnea, unspecified: Secondary | ICD-10-CM | POA: Diagnosis not present

## 2016-11-19 DIAGNOSIS — I10 Essential (primary) hypertension: Secondary | ICD-10-CM | POA: Diagnosis not present

## 2016-11-19 DIAGNOSIS — E7800 Pure hypercholesterolemia, unspecified: Secondary | ICD-10-CM | POA: Diagnosis not present

## 2016-11-19 DIAGNOSIS — I255 Ischemic cardiomyopathy: Secondary | ICD-10-CM | POA: Diagnosis not present

## 2016-11-19 DIAGNOSIS — E039 Hypothyroidism, unspecified: Secondary | ICD-10-CM | POA: Diagnosis not present

## 2016-11-19 DIAGNOSIS — F411 Generalized anxiety disorder: Secondary | ICD-10-CM | POA: Diagnosis not present

## 2016-11-19 DIAGNOSIS — F330 Major depressive disorder, recurrent, mild: Secondary | ICD-10-CM | POA: Diagnosis not present

## 2017-02-05 DIAGNOSIS — E039 Hypothyroidism, unspecified: Secondary | ICD-10-CM | POA: Diagnosis not present

## 2017-02-05 DIAGNOSIS — R5383 Other fatigue: Secondary | ICD-10-CM | POA: Diagnosis not present

## 2017-02-08 DIAGNOSIS — R5383 Other fatigue: Secondary | ICD-10-CM | POA: Diagnosis not present

## 2017-02-08 DIAGNOSIS — E039 Hypothyroidism, unspecified: Secondary | ICD-10-CM | POA: Diagnosis not present

## 2017-02-11 DIAGNOSIS — H524 Presbyopia: Secondary | ICD-10-CM | POA: Diagnosis not present

## 2017-02-11 DIAGNOSIS — H26491 Other secondary cataract, right eye: Secondary | ICD-10-CM | POA: Diagnosis not present

## 2017-02-11 DIAGNOSIS — H35373 Puckering of macula, bilateral: Secondary | ICD-10-CM | POA: Diagnosis not present

## 2017-02-15 DIAGNOSIS — I119 Hypertensive heart disease without heart failure: Secondary | ICD-10-CM | POA: Diagnosis not present

## 2017-02-15 DIAGNOSIS — I447 Left bundle-branch block, unspecified: Secondary | ICD-10-CM | POA: Diagnosis not present

## 2017-02-15 DIAGNOSIS — E785 Hyperlipidemia, unspecified: Secondary | ICD-10-CM | POA: Diagnosis not present

## 2017-02-15 DIAGNOSIS — E668 Other obesity: Secondary | ICD-10-CM | POA: Diagnosis not present

## 2017-02-15 DIAGNOSIS — G4733 Obstructive sleep apnea (adult) (pediatric): Secondary | ICD-10-CM | POA: Diagnosis not present

## 2017-02-15 DIAGNOSIS — I428 Other cardiomyopathies: Secondary | ICD-10-CM | POA: Diagnosis not present

## 2017-02-15 DIAGNOSIS — E039 Hypothyroidism, unspecified: Secondary | ICD-10-CM | POA: Diagnosis not present

## 2017-02-15 DIAGNOSIS — I2510 Atherosclerotic heart disease of native coronary artery without angina pectoris: Secondary | ICD-10-CM | POA: Diagnosis not present

## 2017-02-15 DIAGNOSIS — N183 Chronic kidney disease, stage 3 (moderate): Secondary | ICD-10-CM | POA: Diagnosis not present

## 2017-03-03 ENCOUNTER — Encounter (INDEPENDENT_AMBULATORY_CARE_PROVIDER_SITE_OTHER): Payer: Medicare Other | Admitting: Ophthalmology

## 2017-03-03 DIAGNOSIS — H35033 Hypertensive retinopathy, bilateral: Secondary | ICD-10-CM

## 2017-03-03 DIAGNOSIS — I10 Essential (primary) hypertension: Secondary | ICD-10-CM

## 2017-03-03 DIAGNOSIS — H35372 Puckering of macula, left eye: Secondary | ICD-10-CM

## 2017-03-03 DIAGNOSIS — H43813 Vitreous degeneration, bilateral: Secondary | ICD-10-CM | POA: Diagnosis not present

## 2017-03-23 DIAGNOSIS — H35372 Puckering of macula, left eye: Secondary | ICD-10-CM | POA: Diagnosis not present

## 2017-03-23 DIAGNOSIS — H5030 Unspecified intermittent heterotropia: Secondary | ICD-10-CM | POA: Diagnosis not present

## 2017-03-23 DIAGNOSIS — Z961 Presence of intraocular lens: Secondary | ICD-10-CM | POA: Diagnosis not present

## 2017-03-23 DIAGNOSIS — Z9842 Cataract extraction status, left eye: Secondary | ICD-10-CM | POA: Diagnosis not present

## 2017-03-23 DIAGNOSIS — Z885 Allergy status to narcotic agent status: Secondary | ICD-10-CM | POA: Diagnosis not present

## 2017-03-23 DIAGNOSIS — H53122 Transient visual loss, left eye: Secondary | ICD-10-CM | POA: Diagnosis not present

## 2017-03-23 DIAGNOSIS — I10 Essential (primary) hypertension: Secondary | ICD-10-CM | POA: Diagnosis not present

## 2017-03-23 DIAGNOSIS — Z888 Allergy status to other drugs, medicaments and biological substances status: Secondary | ICD-10-CM | POA: Diagnosis not present

## 2017-03-23 DIAGNOSIS — H53132 Sudden visual loss, left eye: Secondary | ICD-10-CM | POA: Diagnosis not present

## 2017-03-25 DIAGNOSIS — G4733 Obstructive sleep apnea (adult) (pediatric): Secondary | ICD-10-CM | POA: Diagnosis not present

## 2017-03-25 DIAGNOSIS — N183 Chronic kidney disease, stage 3 (moderate): Secondary | ICD-10-CM | POA: Diagnosis not present

## 2017-03-25 DIAGNOSIS — I119 Hypertensive heart disease without heart failure: Secondary | ICD-10-CM | POA: Diagnosis not present

## 2017-03-25 DIAGNOSIS — I447 Left bundle-branch block, unspecified: Secondary | ICD-10-CM | POA: Diagnosis not present

## 2017-03-25 DIAGNOSIS — I428 Other cardiomyopathies: Secondary | ICD-10-CM | POA: Diagnosis not present

## 2017-03-25 DIAGNOSIS — I2510 Atherosclerotic heart disease of native coronary artery without angina pectoris: Secondary | ICD-10-CM | POA: Diagnosis not present

## 2017-03-25 DIAGNOSIS — E668 Other obesity: Secondary | ICD-10-CM | POA: Diagnosis not present

## 2017-03-25 DIAGNOSIS — E785 Hyperlipidemia, unspecified: Secondary | ICD-10-CM | POA: Diagnosis not present

## 2017-03-25 DIAGNOSIS — E039 Hypothyroidism, unspecified: Secondary | ICD-10-CM | POA: Diagnosis not present

## 2017-04-01 ENCOUNTER — Ambulatory Visit (HOSPITAL_COMMUNITY)
Admission: RE | Admit: 2017-04-01 | Discharge: 2017-04-01 | Disposition: A | Discharge status: Home / Self Care | Payer: Medicare Other | Source: Ambulatory Visit | Attending: Vascular Surgery | Admitting: Vascular Surgery

## 2017-04-01 ENCOUNTER — Other Ambulatory Visit: Payer: Self-pay | Admitting: Cardiology

## 2017-04-01 DIAGNOSIS — G453 Amaurosis fugax: Secondary | ICD-10-CM

## 2017-04-01 LAB — VAS US CAROTID
LCCADDIAS: -18 cm/s
LCCAPDIAS: 16 cm/s
LCCAPSYS: 169 cm/s
LEFT ECA DIAS: -9 cm/s
LICADSYS: -73 cm/s
LICAPSYS: -80 cm/s
Left CCA dist sys: -86 cm/s
Left ICA dist dias: -22 cm/s
Left ICA prox dias: -20 cm/s
RCCAPSYS: 119 cm/s
RIGHT CCA MID DIAS: -18 cm/s
RIGHT ECA DIAS: 14 cm/s
Right CCA prox dias: 15 cm/s
Right cca dist sys: 99 cm/s

## 2017-04-06 DIAGNOSIS — E039 Hypothyroidism, unspecified: Secondary | ICD-10-CM | POA: Diagnosis not present

## 2017-04-06 DIAGNOSIS — G453 Amaurosis fugax: Secondary | ICD-10-CM | POA: Diagnosis not present

## 2017-05-04 DIAGNOSIS — R5383 Other fatigue: Secondary | ICD-10-CM | POA: Diagnosis not present

## 2017-05-04 DIAGNOSIS — F411 Generalized anxiety disorder: Secondary | ICD-10-CM | POA: Diagnosis not present

## 2017-05-04 DIAGNOSIS — F329 Major depressive disorder, single episode, unspecified: Secondary | ICD-10-CM | POA: Diagnosis not present

## 2017-05-04 DIAGNOSIS — E039 Hypothyroidism, unspecified: Secondary | ICD-10-CM | POA: Diagnosis not present

## 2017-05-04 DIAGNOSIS — Z1389 Encounter for screening for other disorder: Secondary | ICD-10-CM | POA: Diagnosis not present

## 2017-05-11 DIAGNOSIS — F331 Major depressive disorder, recurrent, moderate: Secondary | ICD-10-CM | POA: Diagnosis not present

## 2017-05-11 DIAGNOSIS — F411 Generalized anxiety disorder: Secondary | ICD-10-CM | POA: Diagnosis not present

## 2017-05-11 DIAGNOSIS — E039 Hypothyroidism, unspecified: Secondary | ICD-10-CM | POA: Diagnosis not present

## 2017-05-11 DIAGNOSIS — I10 Essential (primary) hypertension: Secondary | ICD-10-CM | POA: Diagnosis not present

## 2017-05-11 DIAGNOSIS — G4730 Sleep apnea, unspecified: Secondary | ICD-10-CM | POA: Diagnosis not present

## 2017-05-11 DIAGNOSIS — E7800 Pure hypercholesterolemia, unspecified: Secondary | ICD-10-CM | POA: Diagnosis not present

## 2017-06-14 DIAGNOSIS — I10 Essential (primary) hypertension: Secondary | ICD-10-CM | POA: Diagnosis not present

## 2017-06-14 DIAGNOSIS — F411 Generalized anxiety disorder: Secondary | ICD-10-CM | POA: Diagnosis not present

## 2017-06-14 DIAGNOSIS — F331 Major depressive disorder, recurrent, moderate: Secondary | ICD-10-CM | POA: Diagnosis not present

## 2017-06-14 DIAGNOSIS — I255 Ischemic cardiomyopathy: Secondary | ICD-10-CM | POA: Diagnosis not present

## 2017-06-14 DIAGNOSIS — E7800 Pure hypercholesterolemia, unspecified: Secondary | ICD-10-CM | POA: Diagnosis not present

## 2017-06-14 DIAGNOSIS — G4730 Sleep apnea, unspecified: Secondary | ICD-10-CM | POA: Diagnosis not present

## 2017-06-14 DIAGNOSIS — E039 Hypothyroidism, unspecified: Secondary | ICD-10-CM | POA: Diagnosis not present

## 2017-07-20 DIAGNOSIS — R7303 Prediabetes: Secondary | ICD-10-CM | POA: Diagnosis not present

## 2017-07-20 DIAGNOSIS — R251 Tremor, unspecified: Secondary | ICD-10-CM | POA: Diagnosis not present

## 2017-07-20 DIAGNOSIS — R55 Syncope and collapse: Secondary | ICD-10-CM | POA: Diagnosis not present

## 2017-07-20 DIAGNOSIS — H9313 Tinnitus, bilateral: Secondary | ICD-10-CM | POA: Diagnosis not present

## 2017-07-20 DIAGNOSIS — G5603 Carpal tunnel syndrome, bilateral upper limbs: Secondary | ICD-10-CM | POA: Diagnosis not present

## 2017-07-20 DIAGNOSIS — R635 Abnormal weight gain: Secondary | ICD-10-CM | POA: Diagnosis not present

## 2017-07-21 DIAGNOSIS — R251 Tremor, unspecified: Secondary | ICD-10-CM | POA: Diagnosis not present

## 2017-07-21 DIAGNOSIS — R635 Abnormal weight gain: Secondary | ICD-10-CM | POA: Diagnosis not present

## 2017-07-21 DIAGNOSIS — R739 Hyperglycemia, unspecified: Secondary | ICD-10-CM | POA: Diagnosis not present

## 2017-07-21 DIAGNOSIS — R7303 Prediabetes: Secondary | ICD-10-CM | POA: Diagnosis not present

## 2017-07-21 DIAGNOSIS — G5603 Carpal tunnel syndrome, bilateral upper limbs: Secondary | ICD-10-CM | POA: Diagnosis not present

## 2017-07-21 DIAGNOSIS — R55 Syncope and collapse: Secondary | ICD-10-CM | POA: Diagnosis not present

## 2017-07-28 DIAGNOSIS — R635 Abnormal weight gain: Secondary | ICD-10-CM | POA: Diagnosis not present

## 2017-07-28 DIAGNOSIS — R55 Syncope and collapse: Secondary | ICD-10-CM | POA: Diagnosis not present

## 2017-07-28 DIAGNOSIS — Z6834 Body mass index (BMI) 34.0-34.9, adult: Secondary | ICD-10-CM | POA: Diagnosis not present

## 2017-08-03 ENCOUNTER — Other Ambulatory Visit: Payer: Self-pay

## 2017-08-03 ENCOUNTER — Emergency Department (HOSPITAL_COMMUNITY): Payer: Medicare Other

## 2017-08-03 ENCOUNTER — Emergency Department (HOSPITAL_COMMUNITY)
Admission: EM | Admit: 2017-08-03 | Discharge: 2017-08-03 | Disposition: A | Discharge status: Home / Self Care | Payer: Medicare Other | Attending: Emergency Medicine | Admitting: Emergency Medicine

## 2017-08-03 DIAGNOSIS — R51 Headache: Secondary | ICD-10-CM | POA: Diagnosis not present

## 2017-08-03 DIAGNOSIS — F419 Anxiety disorder, unspecified: Secondary | ICD-10-CM | POA: Insufficient documentation

## 2017-08-03 DIAGNOSIS — I6789 Other cerebrovascular disease: Secondary | ICD-10-CM | POA: Diagnosis not present

## 2017-08-03 DIAGNOSIS — J45909 Unspecified asthma, uncomplicated: Secondary | ICD-10-CM | POA: Insufficient documentation

## 2017-08-03 DIAGNOSIS — Z79899 Other long term (current) drug therapy: Secondary | ICD-10-CM | POA: Insufficient documentation

## 2017-08-03 DIAGNOSIS — R410 Disorientation, unspecified: Secondary | ICD-10-CM | POA: Diagnosis not present

## 2017-08-03 DIAGNOSIS — I252 Old myocardial infarction: Secondary | ICD-10-CM | POA: Diagnosis not present

## 2017-08-03 DIAGNOSIS — I5042 Chronic combined systolic (congestive) and diastolic (congestive) heart failure: Secondary | ICD-10-CM | POA: Diagnosis not present

## 2017-08-03 DIAGNOSIS — R4781 Slurred speech: Secondary | ICD-10-CM | POA: Insufficient documentation

## 2017-08-03 DIAGNOSIS — I110 Hypertensive heart disease with heart failure: Secondary | ICD-10-CM | POA: Diagnosis not present

## 2017-08-03 DIAGNOSIS — E039 Hypothyroidism, unspecified: Secondary | ICD-10-CM | POA: Diagnosis not present

## 2017-08-03 DIAGNOSIS — I10 Essential (primary) hypertension: Secondary | ICD-10-CM | POA: Diagnosis not present

## 2017-08-03 LAB — COMPREHENSIVE METABOLIC PANEL
ALBUMIN: 4.2 g/dL (ref 3.5–5.0)
ALT: 27 U/L (ref 14–54)
ANION GAP: 12 (ref 5–15)
AST: 26 U/L (ref 15–41)
Alkaline Phosphatase: 81 U/L (ref 38–126)
BUN: 16 mg/dL (ref 6–20)
CO2: 24 mmol/L (ref 22–32)
Calcium: 9.8 mg/dL (ref 8.9–10.3)
Chloride: 104 mmol/L (ref 101–111)
Creatinine, Ser: 0.81 mg/dL (ref 0.44–1.00)
GFR calc non Af Amer: >60 mL/min (ref >60)
GLUCOSE: 107 mg/dL — AB (ref 65–99)
POTASSIUM: 3.8 mmol/L (ref 3.5–5.1)
SODIUM: 140 mmol/L (ref 135–145)
Total Bilirubin: 0.6 mg/dL (ref 0.3–1.2)
Total Protein: 7.6 g/dL (ref 6.5–8.1)

## 2017-08-03 LAB — CBC
HCT: 41 % (ref 36.0–46.0)
Hemoglobin: 13.1 g/dL (ref 12.0–15.0)
MCH: 25.1 pg — AB (ref 26.0–34.0)
MCHC: 32 g/dL (ref 30.0–36.0)
MCV: 78.5 fL (ref 78.0–100.0)
PLATELETS: 264 10*3/uL (ref 150–400)
RBC: 5.22 MIL/uL — AB (ref 3.87–5.11)
RDW: 14.5 % (ref 11.5–15.5)
WBC: 8 10*3/uL (ref 4.0–10.5)

## 2017-08-03 LAB — DIFFERENTIAL
BASOS PCT: 1 %
Basophils Absolute: 0.1 10*3/uL (ref 0.0–0.1)
EOS ABS: 0.2 10*3/uL (ref 0.0–0.7)
EOS PCT: 2 %
Lymphocytes Relative: 28 %
Lymphs Abs: 2.2 10*3/uL (ref 0.7–4.0)
MONO ABS: 0.8 10*3/uL (ref 0.1–1.0)
Monocytes Relative: 10 %
NEUTROS PCT: 59 %
Neutro Abs: 4.8 10*3/uL (ref 1.7–7.7)

## 2017-08-03 LAB — I-STAT CHEM 8, ED
BUN: 18 mg/dL (ref 6–20)
CALCIUM ION: 1.17 mmol/L (ref 1.15–1.40)
CHLORIDE: 104 mmol/L (ref 101–111)
Creatinine, Ser: 0.8 mg/dL (ref 0.44–1.00)
Glucose, Bld: 105 mg/dL — ABNORMAL HIGH (ref 65–99)
HEMATOCRIT: 41 % (ref 36.0–46.0)
Hemoglobin: 13.9 g/dL (ref 12.0–15.0)
Potassium: 3.8 mmol/L (ref 3.5–5.1)
SODIUM: 142 mmol/L (ref 135–145)
TCO2: 27 mmol/L (ref 22–32)

## 2017-08-03 LAB — I-STAT CG4 LACTIC ACID, ED: LACTIC ACID, VENOUS: 0.77 mmol/L (ref 0.5–1.9)

## 2017-08-03 LAB — APTT: aPTT: 28 seconds (ref 24–36)

## 2017-08-03 LAB — I-STAT TROPONIN, ED: Troponin i, poc: 0.01 ng/mL (ref 0.00–0.08)

## 2017-08-03 LAB — PROTIME-INR
INR: 0.95
PROTHROMBIN TIME: 12.6 s (ref 11.4–15.2)

## 2017-08-03 LAB — ETHANOL

## 2017-08-03 MED ORDER — METOCLOPRAMIDE HCL 5 MG/ML IJ SOLN
10.0000 mg | Freq: Once | INTRAMUSCULAR | Status: AC
  Administered 2017-08-03: 10 mg via INTRAVENOUS
  Filled 2017-08-03: qty 2

## 2017-08-03 NOTE — Code Documentation (Signed)
72yo female arriving to [facility] via [place] at 72.  Patient was getting labs drawn when staff noticed slurred speech and called EMS. LKW 1300 when patient arrived for labs. Patient also reporting left hand numbness and central headache.  Code stroke activated by EMS.  Patient's husband reported patient with history of similar episode that resolved after laying down.  Stroke team at the bedside on patient arrival.  Labs drawn and patient to CT.  CT completed.  NIHSS 0, see documentation for details and code stroke times.  Patient with slow speech, however, able to name and read NIHSS cards.  Patient reporting symptoms occur with stress.  No acute stroke treatment at this time.  Code stroke canceled.  Bedside handoff with ED RN Vikki Ports.

## 2017-08-03 NOTE — Discharge Instructions (Signed)
Please follow-up with one of the neurology offices outlined for further evaluation and treatment of your symptoms.  Please return to the emergency department if you develop any new or worsening symptoms.

## 2017-08-03 NOTE — Consult Note (Signed)
Requesting Physician: Dr. Ashok Cordia    Chief Complaint: Code stroke  History obtained from:  Patient     HPI:                                                                                                                                         Laura [name] is an 72 y.o. female with stroke risk factors of hyperlipidemia.  Patient apparently was at her PCP getting some blood work and dropping off urine today at approximately 1:00 and then was noted to have some abnormal speech.  EMS was called and patient was brought to [facility] is a code stroke.  On arrival patient showed slow methodic speech but no dysarthria or aphasia.  Patient was able to move all extremities and showed no other localizing or lateralizing symptoms.  Patient was not a TPA candidate nor was she a interventional candidate.  Date last known well: Date: 07/03/2017 Time last known well: Time: 13:00 tPA Given: No: Minimal symptoms NIH stroke scale of 0 Modified Rankin: Rankin Score=0  I  Past Medical History:  Diagnosis Date  . Anemia   . Anxiety   . Asthma    related to sesonal allergies  . Chronic combined systolic and diastolic CHF, NYHA class 2 ([place]) CARDIOLOGIST-  DR IRCVELFY  . Depression   . History of kidney stones   . History of non-ST elevation myocardial infarction (NSTEMI) JUNE 2011   SECONDARY TO TAKOTSUDO SYNDROME (CARDIAC CATH NORMAL)  . Hyperlipemia   . Hypertension   . Hypoglycemia   . Hypothyroidism   . LBBB (left bundle branch block)   . Left ventricular ejection fraction less than 40%    38% PER CARDIOLOGIST NOTE (DR CROITORU)  . Memory loss   . Mood swings ([place])   . Nonischemic dilated cardiomyopathy ([place])    MODERATELY DEPRESSED LVF;EF 35-45% by Echo 05/27/11  . OSA (obstructive sleep apnea) MODERATE PER STUDY 2005   CPAP NONCOMPLIANT  . Seasonal allergies   . SUI (stress urinary incontinence, female)     Past Surgical History:  Procedure Laterality Date  . ABDOMINAL HYSTERECTOMY   1985   partial  . CARDIAC CATHETERIZATION  09-04-1999;  08/25/2004;   12/09/2009  DR CROITORU   NORMAL CORONARIES/  APICAL BALLOONING OF LV CONSISTENT WITH TAKOTSUBO SYMPTOMS/ EF 30-35%  . CATARACT EXTRACTION W/ INTRAOCULAR LENS  IMPLANT, BILATERAL    . CHOLECYSTECTOMY N/A 09/29/2012   Procedure: LAPAROSCOPIC CHOLECYSTECTOMY WITH INTRAOPERATIVE CHOLANGIOGRAM;  Surgeon: Adin Hector, MD;  Location: [facility];  Service: General;  Laterality: N/A;  . CYSTOSCOPY N/A 09/19/2012   Procedure: Erlene Quan;  Surgeon: Bernestine Amass, MD;  Location: [facility];  Service: Urology;  Laterality: N/A;  . KNEE ARTHROSCOPY W/ MENISCECTOMY  07-27-2011   MEDIAL AND LATERAL  . NASAL SEPTUM SURGERY  1980's  . PUBOVAGINAL SLING N/A 09/19/2012  Procedure: SUBURETHRAL Janyth Pupa;  Surgeon: Bernestine Amass, MD;  Location: [facility];  Service: Urology;  Laterality: N/A;  . RIGHT URETEROSCOPIC STONE EXTRACTION  08-31-2000  . TRANSTHORACIC ECHOCARDIOGRAM  05-27-2011  DR CROITORU   MODERATELY DEPRESSED LVF DUE TO GLOBAL HYPOKINESIS AND MARKED SYSTOLIC ASYNCHRONY/ EF 30%/ MILD LEFT ATRIAL DILATATION    Family History  Problem Relation Age of Onset  . Pneumonia Mother   . Hypertension Mother   . Heart attack Father   . Fibromyalgia Brother   . Pulmonary embolism Brother   . Hypertension Brother   . Heart attack Paternal Grandfather    Social History:  reports that  has never smoked. she has never used smokeless tobacco. She reports that she does not drink alcohol or use drugs.  Allergies:  Allergies  Allergen Reactions  . Codeine Anaphylaxis, Hives and Other (See Comments)    Headache. Daughter reported that it caused her throat to swell up   . Ace Inhibitors Other (See Comments)    Unknown- it "didn't agree with her"   . Citalopram     Fatigue  . Fetzima [Levomilnacipran] Other (See Comments)    "Talking out of my head"  . Lasix [Furosemide] Other (See Comments)     HEADACHE  . Nsaids Other (See Comments)    Told not to take NSAIDs because of her heart   . Xanax Xr [Alprazolam Er] Other (See Comments)    confusion    Medications:                                                                                                                           No current facility-administered medications for this encounter.    Current Outpatient Medications  Medication Sig Dispense Refill  . albuterol (PROVENTIL HFA;VENTOLIN HFA) 108 (90 BASE) MCG/ACT inhaler Inhale 2 puffs into the lungs as needed.     Marland Kitchen atorvastatin (LIPITOR) 80 MG tablet TAKE 1/2 TABLET BY MOUTH DAILY FOR CHOLESTEROL 45 tablet 3  . bumetanide (BUMEX) 1 MG tablet ALTERNATE ONE TABLET WITH TWO TABLETS 135 tablet 3  . cetirizine (ZYRTEC) 10 MG tablet Take 10 mg by mouth as needed. Take 1/2 tab daily    . diazepam (VALIUM) 5 MG tablet Take 2 tablets (10 mg total) by mouth every 12 (twelve) hours as needed for anxiety. 20 tablet 0  . levothyroxine (SYNTHROID, LEVOTHROID) 75 MCG tablet TAKE 1 TABLET BY MOUTH EVERY DAY 90 tablet 0  . LORazepam (ATIVAN) 1 MG tablet Take 1 mg by mouth as needed for anxiety.    Marland Kitchen losartan (COZAAR) 100 MG tablet Take 1 tablet (100 mg total) by mouth daily. 90 tablet 3  . metoprolol succinate (TOPROL-XL) 50 MG 24 hr tablet Take 1 tablet (50 mg total) by mouth every morning. 90 tablet 3  . NUVIGIL 250 MG tablet Take 250 mg by mouth as needed.  3  . potassium chloride (KLOR-CON M10) 10 MEQ tablet Take  1 tablet (10 mEq total) by mouth 2 (two) times daily. 180 tablet 3     ROS:                                                                                                                                       History obtained from the patient  General ROS: negative for - chills, fatigue, fever, night sweats, weight gain or weight loss Psychological ROS: negative for - , hallucinations, memory difficulties, mood swings or  Ophthalmic ROS: negative for - blurry vision,  double vision, eye pain or loss of vision ENT ROS: negative for - epistaxis, nasal discharge, oral lesions, sore throat, tinnitus or vertigo Respiratory ROS: negative for - cough,  shortness of breath or wheezing Cardiovascular ROS: negative for - chest pain, dyspnea on exertion,  Gastrointestinal ROS: negative for - abdominal pain, diarrhea,  nausea/vomiting or stool incontinence Genito-Urinary ROS: negative for - dysuria, hematuria, incontinence or urinary frequency/urgency Musculoskeletal ROS: negative for - joint swelling or muscular weakness Neurological ROS: as noted in HPI   General Examination:                                                                                                      Weight 85.6 kg (188 lb 11.4 oz).  HEENT-  Normocephalic, no lesions, without obvious abnormality.  Normal external eye and conjunctiva.   Cardiovascular- S1-S2 audible, pulses palpable throughout   Lungs-no rhonchi or wheezing noted, no excessive working breathing.  Saturations within normal limits Abdomen- All 4 quadrants palpated and nontender Extremities- Warm, dry and intact Musculoskeletal-no joint tenderness, deformity or swelling Skin-warm and dry, no hyperpigmentation, vitiligo, or suspicious lesions  Neurological Examination Mental Status: Alert, oriented, thought content appropriate.  Speech slow and methodical but fluent without evidence of aphasia.  Able to follow 3 step commands without difficulty. Cranial Nerves: II:  Visual fields grossly normal,  III,IV, VI: ptosis not present, extra-ocular motions intact bilaterally, pupils equal, round, reactive to light and accommodation V,VII: smile symmetric, facial light touch sensation normal bilaterally VIII: hearing normal bilaterally IX,X: uvula rises symmetrically XI: bilateral shoulder shrug XII: midline tongue extension Motor: Right : Upper extremity   5/5    Left:     Upper extremity   5/5  Lower extremity   5/5     Lower  extremity   5/5 Tone and bulk:normal tone throughout; no atrophy noted Sensory: Pinprick and light touch intact throughout, bilaterally Deep Tendon Reflexes: 2+ and symmetric throughout  with no ankle jerk Plantars: Right: downgoing   Left: downgoing Cerebellar: normal finger-to-nose,and normal heel-to-shin test Gait: Not tested   Lab Results: Basic Metabolic Panel: Recent Labs  Lab 08/03/17 1416  NA 142  K 3.8  CL 104  GLUCOSE 105*  BUN 18  CREATININE 0.80    CBC: Recent Labs  Lab 08/03/17 1413 08/03/17 1416  WBC 8.0  --   NEUTROABS 4.8  --   HGB 13.1 13.9  HCT 41.0 41.0  MCV 78.5  --   PLT 264  --     Lipid Panel: No results for input(s): CHOL, TRIG, HDL, CHOLHDL, VLDL, LDLCALC in the last 168 hours.  CBG: No results for input(s): GLUCAP in the last 168 hours.  Imaging: No results found.  Assessment and plan discussed with with attending physician and they are in agreement.    Etta Quill PA-C [facility] [phone]  08/03/2017, 2:26 PM   I have seen the patient and reviewed the above note.  She has had multiple episodes (she estimates much greater than 10) of mild confusion, and these seem to be brought on by stress.  Assessment: 72 y.o. female with recurrent spells of abnormal speech in the setting of headache and stress.  Speech pattern is most consistent with a nonorganic physiology, however difficult to say if migraine may be playing a role given her headache.  As long as her symptoms return to normal, I do not think any further evaluation is necessary at this time.  Stroke Risk Factors - hyperlipidemia  Recommend 1) as long as patient returns to baseline, no further testing 2) if she remains persistently altered, and MRI of the brain would be reasonable. 3) as long as this is negative, I would not favor admission, could consider treatment of her headache.   Roland Rack, MD [facility] [phone]  If 7pm- 7am,  please page neurology on call as listed in [place].

## 2017-08-03 NOTE — ED Provider Notes (Signed)
[facility] EMERGENCY DEPARTMENT Provider Note   CSN: [id number] Arrival date & time: 08/03/17  1409     History   Chief Complaint Chief Complaint  Patient presents with  . Code Stroke    HPI Laura [name] is a 72 y.o. female with history of hypertension, hypothyroidism, hyperlipidemia, anxiety, CHF who presents with speech difficulty and confusion.  Patient has had 4 episodes of of the same exact symptoms in the past and has been worked up 3 different times with no outcome.  The symptoms usually come when the patient is stressed.  She has been more stressed over the past 5 years since her brother died and her sister almost died.  She also has trouble with her relationship with her daughter.  Patient reports that she was driving into town today to go to a doctor's appointment when she began having trouble speaking and becoming confused.  She got herself to the doctor's office, however did not understand what was going on and could not communicate well.  EMS was called.  Code stroke was called.  Patient has 4/10 headache.  Patient has history of migraines and feels similar, although she has not had them in a while.  HPI  Past Medical History:  Diagnosis Date  . Anemia   . Anxiety   . Asthma    related to sesonal allergies  . Chronic combined systolic and diastolic CHF, NYHA class 2 ([place]) CARDIOLOGIST-  DR VELFYBOF  . Depression   . History of kidney stones   . History of non-ST elevation myocardial infarction (NSTEMI) JUNE 2011   SECONDARY TO TAKOTSUDO SYNDROME (CARDIAC CATH NORMAL)  . Hyperlipemia   . Hypertension   . Hypoglycemia   . Hypothyroidism   . LBBB (left bundle branch block)   . Left ventricular ejection fraction less than 40%    38% PER CARDIOLOGIST NOTE (DR CROITORU)  . Memory loss   . Mood swings ([place])   . Nonischemic dilated cardiomyopathy ([place])    MODERATELY DEPRESSED LVF;EF 35-45% by Echo 05/27/11  . OSA (obstructive sleep apnea) MODERATE  PER STUDY 2005   CPAP NONCOMPLIANT  . Seasonal allergies   . SUI (stress urinary incontinence, female)     Patient Active Problem List   Diagnosis Date Noted  . Memory loss 10/14/2015  . Mixed hyperlipidemia 08/23/2014  . Major depressive disorder, recurrent episode, severe ([place]) 09/22/2013  . Vitamin D Deficiency 07/28/2013  . Medication management 07/28/2013  . Prediabetes 07/28/2013  . Obstructive sleep apnea 05/27/2013  . Arrhythmia 04/14/2013  . Palpitations 03/12/2013  . Delayed gastric emptying 10/18/2012  . Steatohepatitis, nonalcoholic 75/03/2584  . Female stress incontinence 09/19/2012  . Takotsubo syndrome, June 2011.(normal coronaries) 09/12/2012  . Cardiomyopathy- EF 45-50% by echo 09/26/12 09/12/2012  . Anxiety disorder  09/12/2012  . HTN (hypertension) 09/12/2012  . LBBB (left bundle branch block) 09/12/2012  . Obesity 09/12/2012  . Sleep apnea- non compliant with C-pap 09/12/2012    Past Surgical History:  Procedure Laterality Date  . ABDOMINAL HYSTERECTOMY  1985   partial  . CARDIAC CATHETERIZATION  09-04-1999;  08/25/2004;   12/09/2009  DR CROITORU   NORMAL CORONARIES/  APICAL BALLOONING OF LV CONSISTENT WITH TAKOTSUBO SYMPTOMS/ EF 30-35%  . CATARACT EXTRACTION W/ INTRAOCULAR LENS  IMPLANT, BILATERAL    . CHOLECYSTECTOMY N/A 09/29/2012   Procedure: LAPAROSCOPIC CHOLECYSTECTOMY WITH INTRAOPERATIVE CHOLANGIOGRAM;  Surgeon: Adin Hector, MD;  Location: [facility];  Service: General;  Laterality: N/A;  . CYSTOSCOPY  N/A 09/19/2012   Procedure: CYSTOSCOPY FLEXIBLE;  Surgeon: Bernestine Amass, MD;  Location: [facility];  Service: Urology;  Laterality: N/A;  . KNEE ARTHROSCOPY W/ MENISCECTOMY  07-27-2011   MEDIAL AND LATERAL  . NASAL SEPTUM SURGERY  1980's  . PUBOVAGINAL SLING N/A 09/19/2012   Procedure: SUBURETHRAL Janyth Pupa;  Surgeon: Bernestine Amass, MD;  Location: [facility];  Service: Urology;  Laterality: N/A;  . RIGHT URETEROSCOPIC  STONE EXTRACTION  08-31-2000  . TRANSTHORACIC ECHOCARDIOGRAM  05-27-2011  DR CROITORU   MODERATELY DEPRESSED LVF DUE TO GLOBAL HYPOKINESIS AND MARKED SYSTOLIC ASYNCHRONY/ EF 78%/ MILD LEFT ATRIAL DILATATION    OB History    No data available       Home Medications    Prior to Admission medications   Medication Sig Start Date End Date Taking? Authorizing Provider  albuterol (PROVENTIL HFA;VENTOLIN HFA) 108 (90 BASE) MCG/ACT inhaler Inhale 2 puffs into the lungs as needed.     [provider]  atorvastatin (LIPITOR) 80 MG tablet TAKE 1/2 TABLET BY MOUTH DAILY FOR CHOLESTEROL 04/24/15   Croitoru, Mihai, MD  bumetanide (BUMEX) 1 MG tablet ALTERNATE ONE TABLET WITH TWO TABLETS 04/24/15   Croitoru, Mihai, MD  cetirizine (ZYRTEC) 10 MG tablet Take 10 mg by mouth as needed. Take 1/2 tab daily    [provider]  diazepam (VALIUM) 5 MG tablet Take 2 tablets (10 mg total) by mouth every 12 (twelve) hours as needed for anxiety. 10/25/15   Antonietta Breach, PA-C  levothyroxine (SYNTHROID, LEVOTHROID) 75 MCG tablet TAKE 1 TABLET BY MOUTH EVERY DAY 10/15/14   Unk Pinto, MD  LORazepam (ATIVAN) 1 MG tablet Take 1 mg by mouth as needed for anxiety.    [provider]  losartan (COZAAR) 100 MG tablet Take 1 tablet (100 mg total) by mouth daily. 04/24/15   Croitoru, Mihai, MD  metoprolol succinate (TOPROL-XL) 50 MG 24 hr tablet Take 1 tablet (50 mg total) by mouth every morning. 04/24/15   Croitoru, Mihai, MD  NUVIGIL 250 MG tablet Take 250 mg by mouth as needed. 06/14/15   [provider]  potassium chloride (KLOR-CON M10) 10 MEQ tablet Take 1 tablet (10 mEq total) by mouth 2 (two) times daily. 04/24/15   Croitoru, Dani Gobble, MD    Family History Family History  Problem Relation Age of Onset  . Pneumonia Mother   . Hypertension Mother   . Heart attack Father   . Fibromyalgia Brother   . Pulmonary embolism Brother   . Hypertension Brother   . Heart attack Paternal  Grandfather     Social History Social History   Tobacco Use  . Smoking status: Never Smoker  . Smokeless tobacco: Never Used  Substance Use Topics  . Alcohol use: No    Alcohol/week: 0.0 oz  . Drug use: No     Allergies   Codeine; Ace inhibitors; Citalopram; Fetzima [levomilnacipran]; Lasix [furosemide]; Nsaids; and Xanax xr [alprazolam er]   Review of Systems Review of Systems  Constitutional: Negative for chills and fever.  HENT: Negative for facial swelling and sore throat.   Respiratory: Negative for shortness of breath.   Cardiovascular: Negative for chest pain.  Gastrointestinal: Negative for abdominal pain, nausea and vomiting.  Genitourinary: Negative for dysuria.  Musculoskeletal: Negative for back pain.  Skin: Negative for rash and wound.  Neurological: Positive for speech difficulty and headaches. Negative for weakness and numbness.  Psychiatric/Behavioral: Positive for confusion. The patient is not nervous/anxious.  Physical Exam Updated Vital Signs BP (!) 157/87   Pulse 75   Temp 98 F (36.7 C) (Oral)   Resp 13   Ht 5\' 3"  (1.6 m)   Wt 85.6 kg (188 lb 11.4 oz)   SpO2 99%   BMI 33.43 kg/m   Physical Exam  Constitutional: She appears well-developed and well-nourished. No distress.  HENT:  Head: Normocephalic and atraumatic.  Mouth/Throat: Oropharynx is clear and moist. No oropharyngeal exudate.  Eyes: Conjunctivae are normal. Pupils are equal, round, and reactive to light. Right eye exhibits no discharge. Left eye exhibits no discharge. No scleral icterus.  Neck: Normal range of motion. Neck supple. No thyromegaly present.  Cardiovascular: Normal rate, regular rhythm, normal heart sounds and intact distal pulses. Exam reveals no gallop and no friction rub.  No murmur heard. Pulmonary/Chest: Effort normal and breath sounds normal. No stridor. No respiratory distress. She has no wheezes. She has no rales.  Abdominal: Soft. Bowel sounds are normal.  She exhibits no distension. There is no tenderness. There is no rebound and no guarding.  Musculoskeletal: She exhibits no edema.  Lymphadenopathy:    She has no cervical adenopathy.  Neurological: She is alert. Coordination normal.  CN 3-12 intact; normal sensation throughout; 5/5 strength in all 4 extremities; equal bilateral grip strength; no ataxia on finger to nose; speech was mildly garbled in the beginning of our conversation, however return to normal at the end of my exam  Skin: Skin is warm and dry. No rash noted. She is not diaphoretic. No pallor.  Psychiatric: She has a normal mood and affect.  Nursing note and vitals reviewed.    ED Treatments / Results  Labs (all labs ordered are listed, but only abnormal results are displayed) Labs Reviewed  CBC - Abnormal; Notable for the following components:      Result Value   RBC 5.22 (*)    MCH 25.1 (*)    All other components within normal limits  COMPREHENSIVE METABOLIC PANEL - Abnormal; Notable for the following components:   Glucose, Bld 107 (*)    All other components within normal limits  I-STAT CHEM 8, ED - Abnormal; Notable for the following components:   Glucose, Bld 105 (*)    All other components within normal limits  PROTIME-INR  APTT  DIFFERENTIAL  ETHANOL  I-STAT TROPONIN, ED  CBG MONITORING, ED  I-STAT CG4 LACTIC ACID, ED    EKG  EKG Interpretation  Date/Time:  Tuesday August 03 2017 14:31:07 EST Ventricular Rate:  80 PR Interval:    QRS Duration: 143 QT Interval:  443 QTC Calculation: 512 R Axis:   54 Text Interpretation:  Sinus rhythm Premature ventricular complexes Left bundle branch block Non-specific ST-t changes Confirmed by Lajean Saver [phone]) on 08/03/2017 3:24:29 PM       Radiology Ct Head Code Stroke Wo Contrast  Result Date: 08/03/2017 CLINICAL DATA:  Code stroke. Headaches, slurred speech, and right hand tingling. Focal neuro deficit, less than 6 hours. EXAM: CT HEAD WITHOUT  CONTRAST TECHNIQUE: Contiguous axial images were obtained from the base of the skull through the vertex without intravenous contrast. COMPARISON:  CT head without contrast 12/19/2014. FINDINGS: Brain: No acute infarct, hemorrhage, or mass lesion is present. The ventricles are of normal size. No significant extraaxial fluid collection is present. No significant white matter disease is present. The brainstem and cerebellum is normal. Vascular: Atherosclerotic calcifications are present within the cavernous internal carotid arteries bilaterally. There is no hyperdense vessel. Skull:  Calvarium is intact. No focal lytic or blastic lesions are present. No acute or healing fractures are present. Sinuses/Orbits: The paranasal sinuses and mastoid air cells are clear. Bilateral lens replacements are present. Globes and orbits are otherwise within normal limits. ASPECTS [facility] Stroke Program Early CT Score) - Ganglionic level infarction (caudate, lentiform nuclei, internal capsule, insula, M1-M3 cortex): 7/7 - Supraganglionic infarction (M4-M6 cortex): 3/3 Total score (0-10 with 10 being normal): 10/10 IMPRESSION: 1. Stable normal CT appearance of the brain. 2. Atherosclerosis. 3. No acute intracranial abnormality 4. ASPECTS is 10/10 The above was relayed via text pager to Dr. Leonel Ramsay on 08/03/2017 at 14:30 . Electronically Signed   By: San Morelle M.D.   On: 08/03/2017 14:31    Procedures Procedures (including critical care time)  Medications Ordered in ED Medications  metoCLOPramide (REGLAN) injection 10 mg (10 mg Intravenous Given 08/03/17 1459)     Initial Impression / Assessment and Plan / ED Course  I have reviewed the triage vital signs and the nursing notes.  Pertinent labs & imaging results that were available during my care of the patient were reviewed by me and considered in my medical decision making (see chart for details).  Clinical Course as of Aug 04 1555  Tue Aug 03, 2017  1426 I  spoke with neurologist, Dr. Leonel Ramsay, who advised that patient has history of psychogenic speech patterns and conversion reactions when she is under stress.  This is reportedly similar.  He recommends observation in the ED for return to baseline.  If patient does not return to baseline, recommend MRI.  He does not feel patient needs admission unless patient symptoms persisting and abnormal MRI.  [AL]    Clinical Course User Index [AL] Frederica Kuster, PA-C    Code stroke was called by EMS in route.  Patient evaluated by neurologist, Dr. Leonel Ramsay, who advised that stroke was unlikely and most likely psychogenic and a conversion disorder.  Patient has had 4 other episodes of this all resolved without intervention.  Patient has been worked up 3 other times for stroke in the setting of the symptoms.  Patient has not seen outpatient neurology.  CT and labs unremarkable today.  Patient has returned to baseline and is feeling better.  Patient was given Reglan for mild headache which she reports has improved.  Strict return precautions given.  Patient advised to follow-up with outpatient neurology for further management.  Patient understands and agrees with plan.  Patient vitals stable throughout ED course and discharged in satisfactory condition.  Final Clinical Impressions(s) / ED Diagnoses   Final diagnoses:  Slurred speech  Confusion  Anxiety    ED Discharge Orders    None       Frederica Kuster, PA-C 08/03/17 1557    Lajean Saver, MD 08/03/17 [phone]

## 2017-08-03 NOTE — ED Notes (Signed)
Pt arrives via EMS from MD office across the street as a code stroke with complaints of confusion, headache, left head numbness, and gargled speech. On time of arrival pt denies numbness, tingling, a&ox4. Gargled speech remains at this time. Code stroke cancelled by Dr. Leonel Ramsay.

## 2017-08-05 ENCOUNTER — Encounter: Payer: Self-pay | Admitting: Neurology

## 2017-08-05 ENCOUNTER — Ambulatory Visit (INDEPENDENT_AMBULATORY_CARE_PROVIDER_SITE_OTHER): Payer: Medicare Other | Admitting: Neurology

## 2017-08-05 VITALS — BP 128/78 | HR 104 | Ht 62.5 in | Wt 186.4 lb

## 2017-08-05 DIAGNOSIS — R479 Unspecified speech disturbances: Secondary | ICD-10-CM | POA: Diagnosis not present

## 2017-08-05 DIAGNOSIS — F411 Generalized anxiety disorder: Secondary | ICD-10-CM | POA: Diagnosis not present
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
## 2017-08-05 NOTE — Addendum Note (Signed)
Addended by: Clois Comber on: 08/05/2017 01:04 PM   Modules accepted: Orders

## 2017-08-05 NOTE — Patient Instructions (Signed)
1.  We will get an MRI of the brain 2.  We will check an EEG 3.  If testing negative, I would say that the spells are related to anxiety

## 2017-08-09 ENCOUNTER — Ambulatory Visit (INDEPENDENT_AMBULATORY_CARE_PROVIDER_SITE_OTHER): Payer: Medicare Other | Admitting: Neurology

## 2017-08-09 DIAGNOSIS — R479 Unspecified speech disturbances: Secondary | ICD-10-CM | POA: Diagnosis not present

## 2017-08-16 DIAGNOSIS — I428 Other cardiomyopathies: Secondary | ICD-10-CM | POA: Diagnosis not present

## 2017-08-16 DIAGNOSIS — E039 Hypothyroidism, unspecified: Secondary | ICD-10-CM | POA: Diagnosis not present

## 2017-08-16 DIAGNOSIS — I447 Left bundle-branch block, unspecified: Secondary | ICD-10-CM | POA: Diagnosis not present

## 2017-08-16 DIAGNOSIS — H53122 Transient visual loss, left eye: Secondary | ICD-10-CM | POA: Diagnosis not present

## 2017-08-16 DIAGNOSIS — I119 Hypertensive heart disease without heart failure: Secondary | ICD-10-CM | POA: Diagnosis not present

## 2017-08-16 DIAGNOSIS — E785 Hyperlipidemia, unspecified: Secondary | ICD-10-CM | POA: Diagnosis not present

## 2017-08-16 DIAGNOSIS — G453 Amaurosis fugax: Secondary | ICD-10-CM | POA: Diagnosis not present

## 2017-08-16 DIAGNOSIS — R635 Abnormal weight gain: Secondary | ICD-10-CM | POA: Diagnosis not present

## 2017-08-16 DIAGNOSIS — E668 Other obesity: Secondary | ICD-10-CM | POA: Diagnosis not present

## 2017-08-16 DIAGNOSIS — G4733 Obstructive sleep apnea (adult) (pediatric): Secondary | ICD-10-CM | POA: Diagnosis not present

## 2017-08-16 DIAGNOSIS — N183 Chronic kidney disease, stage 3 (moderate): Secondary | ICD-10-CM | POA: Diagnosis not present

## 2017-08-16 DIAGNOSIS — I2510 Atherosclerotic heart disease of native coronary artery without angina pectoris: Secondary | ICD-10-CM | POA: Diagnosis not present

## 2017-08-16 DIAGNOSIS — E7849 Other hyperlipidemia: Secondary | ICD-10-CM | POA: Diagnosis not present

## 2017-08-19 ENCOUNTER — Telehealth: Payer: Self-pay

## 2017-08-19 ENCOUNTER — Ambulatory Visit
Admission: RE | Admit: 2017-08-19 | Discharge: 2017-08-19 | Disposition: A | Discharge status: Home / Self Care | Payer: Medicare Other | Source: Ambulatory Visit | Attending: Neurology | Admitting: Neurology

## 2017-08-19 DIAGNOSIS — R479 Unspecified speech disturbances: Secondary | ICD-10-CM | POA: Diagnosis not present

## 2017-08-19 NOTE — Telephone Encounter (Signed)
Called and spoke with Pt, gave her MRI results

## 2017-08-19 NOTE — Telephone Encounter (Signed)
-----   Message from Pieter Partridge, DO sent at 08/19/2017  4:02 PM EST ----- MRI of brain looks unremarkable

## 2017-09-02 ENCOUNTER — Encounter (INDEPENDENT_AMBULATORY_CARE_PROVIDER_SITE_OTHER): Payer: Medicare Other | Admitting: Ophthalmology

## 2017-09-06 DIAGNOSIS — R05 Cough: Secondary | ICD-10-CM | POA: Diagnosis not present

## 2017-09-06 DIAGNOSIS — J069 Acute upper respiratory infection, unspecified: Secondary | ICD-10-CM | POA: Diagnosis not present

## 2017-09-13 DIAGNOSIS — E039 Hypothyroidism, unspecified: Secondary | ICD-10-CM | POA: Diagnosis not present

## 2017-09-13 DIAGNOSIS — G4730 Sleep apnea, unspecified: Secondary | ICD-10-CM | POA: Diagnosis not present

## 2017-09-13 DIAGNOSIS — I10 Essential (primary) hypertension: Secondary | ICD-10-CM | POA: Diagnosis not present

## 2017-09-13 DIAGNOSIS — R000 Tachycardia, unspecified: Secondary | ICD-10-CM | POA: Diagnosis not present

## 2017-09-13 DIAGNOSIS — I255 Ischemic cardiomyopathy: Secondary | ICD-10-CM | POA: Diagnosis not present

## 2017-09-13 DIAGNOSIS — F411 Generalized anxiety disorder: Secondary | ICD-10-CM | POA: Diagnosis not present

## 2017-09-13 DIAGNOSIS — J209 Acute bronchitis, unspecified: Secondary | ICD-10-CM | POA: Diagnosis not present

## 2017-09-13 DIAGNOSIS — E7800 Pure hypercholesterolemia, unspecified: Secondary | ICD-10-CM | POA: Diagnosis not present

## 2017-09-24 DIAGNOSIS — N952 Postmenopausal atrophic vaginitis: Secondary | ICD-10-CM | POA: Diagnosis not present

## 2017-09-24 DIAGNOSIS — N760 Acute vaginitis: Secondary | ICD-10-CM | POA: Diagnosis not present

## 2017-09-28 ENCOUNTER — Institutional Professional Consult (permissible substitution): Payer: Medicare Other | Admitting: Neurology

## 2017-10-11 ENCOUNTER — Encounter (INDEPENDENT_AMBULATORY_CARE_PROVIDER_SITE_OTHER): Payer: Medicare Other | Admitting: Ophthalmology

## 2017-10-11 DIAGNOSIS — H35372 Puckering of macula, left eye: Secondary | ICD-10-CM | POA: Diagnosis not present

## 2017-10-11 DIAGNOSIS — I10 Essential (primary) hypertension: Secondary | ICD-10-CM | POA: Diagnosis not present

## 2017-10-11 DIAGNOSIS — H35033 Hypertensive retinopathy, bilateral: Secondary | ICD-10-CM | POA: Diagnosis not present

## 2017-10-11 DIAGNOSIS — H26491 Other secondary cataract, right eye: Secondary | ICD-10-CM | POA: Diagnosis not present

## 2017-10-18 DIAGNOSIS — N952 Postmenopausal atrophic vaginitis: Secondary | ICD-10-CM | POA: Diagnosis not present

## 2017-10-18 DIAGNOSIS — N760 Acute vaginitis: Secondary | ICD-10-CM | POA: Diagnosis not present

## 2017-10-25 ENCOUNTER — Encounter (INDEPENDENT_AMBULATORY_CARE_PROVIDER_SITE_OTHER): Payer: Medicare Other | Admitting: Ophthalmology

## 2017-10-25 NOTE — Progress Notes (Signed)
ELECTROENCEPHALOGRAM REPORT  Date of Study: 08/09/2017  Patient's Name: Laura [name] MRN: [id number] Date of Birth: [birth year]  Clinical History: 72 year old female with transient spells  Medications: CYMBALTA 60 MG capsule   SYNTHROID, LEVOTHROID 88 MCG tablet   PROVENTIL HFA;VENTOLIN HFA 108 (90 BASE) MCG/ACT inhaler   LIPITOR 80 MG tablet  ZYRTEC 10 MG tablet VALIUM 5 MG tablet  ATIVAN 1 MG tablet COZAAR 100 MG tablet  TOPROL-XL 50 MG 24 hr tablet  NUVIGIL 250 MG tablet   KLOR-CON M10 10 MEQ tablet  Technical Summary: A multichannel digital EEG recording measured by the international 10-20 system with electrodes applied with paste and impedances below 5000 ohms performed in our laboratory with EKG monitoring in an awake and drowsy patient.  Hyperventilation was not performed due to cardiac history.  Photic stimulation was performed.  The digital EEG was referentially recorded, reformatted, and digitally filtered in a variety of bipolar and referential montages for optimal display.    Description: The patient is awake and drowsy during the recording.  During maximal wakefulness, there is a symmetric, medium voltage 11 Hz posterior dominant rhythm that attenuates with eye opening.  The record is symmetric.  During drowsiness, there is an increase in theta slowing of the background.  Stage 2 sleep was not seen.  Photic stimulation did not elicit any abnormalities.  There were no epileptiform discharges or electrographic seizures seen.    EKG lead was unremarkable.  Impression: This awake and drowsy EEG is normal.    Clinical Correlation: A normal EEG does not exclude a clinical diagnosis of epilepsy.  If further clinical questions remain, prolonged EEG may be helpful.  Clinical correlation is advised.   Metta Clines, DO

## 2017-10-27 ENCOUNTER — Encounter (INDEPENDENT_AMBULATORY_CARE_PROVIDER_SITE_OTHER): Payer: Medicare Other | Admitting: Ophthalmology

## 2017-10-27 DIAGNOSIS — N952 Postmenopausal atrophic vaginitis: Secondary | ICD-10-CM | POA: Diagnosis not present

## 2017-10-27 DIAGNOSIS — R350 Frequency of micturition: Secondary | ICD-10-CM | POA: Diagnosis not present

## 2017-10-27 DIAGNOSIS — R600 Localized edema: Secondary | ICD-10-CM | POA: Diagnosis not present

## 2017-11-10 ENCOUNTER — Encounter (INDEPENDENT_AMBULATORY_CARE_PROVIDER_SITE_OTHER): Payer: Medicare Other | Admitting: Ophthalmology

## 2017-11-10 DIAGNOSIS — H2701 Aphakia, right eye: Secondary | ICD-10-CM

## 2017-11-24 DIAGNOSIS — R05 Cough: Secondary | ICD-10-CM | POA: Diagnosis not present

## 2017-11-24 DIAGNOSIS — J069 Acute upper respiratory infection, unspecified: Secondary | ICD-10-CM | POA: Diagnosis not present

## 2017-11-24 DIAGNOSIS — B9789 Other viral agents as the cause of diseases classified elsewhere: Secondary | ICD-10-CM | POA: Diagnosis not present

## 2017-12-03 DIAGNOSIS — J4541 Moderate persistent asthma with (acute) exacerbation: Secondary | ICD-10-CM | POA: Diagnosis not present

## 2017-12-06 ENCOUNTER — Ambulatory Visit
Admission: RE | Admit: 2017-12-06 | Discharge: 2017-12-06 | Disposition: A | Discharge status: Home / Self Care | Payer: Medicare Other | Source: Ambulatory Visit | Attending: Family Medicine | Admitting: Family Medicine

## 2017-12-06 ENCOUNTER — Other Ambulatory Visit: Payer: Self-pay | Admitting: Family Medicine

## 2017-12-06 DIAGNOSIS — J4541 Moderate persistent asthma with (acute) exacerbation: Secondary | ICD-10-CM

## 2017-12-06 DIAGNOSIS — R05 Cough: Secondary | ICD-10-CM | POA: Diagnosis not present

## 2017-12-15 DIAGNOSIS — I10 Essential (primary) hypertension: Secondary | ICD-10-CM | POA: Diagnosis not present

## 2017-12-15 DIAGNOSIS — F330 Major depressive disorder, recurrent, mild: Secondary | ICD-10-CM | POA: Diagnosis not present

## 2017-12-15 DIAGNOSIS — R600 Localized edema: Secondary | ICD-10-CM | POA: Diagnosis not present

## 2017-12-15 DIAGNOSIS — F411 Generalized anxiety disorder: Secondary | ICD-10-CM | POA: Diagnosis not present

## 2017-12-15 DIAGNOSIS — E039 Hypothyroidism, unspecified: Secondary | ICD-10-CM | POA: Diagnosis not present

## 2017-12-15 DIAGNOSIS — E7800 Pure hypercholesterolemia, unspecified: Secondary | ICD-10-CM | POA: Diagnosis not present

## 2017-12-15 DIAGNOSIS — G4730 Sleep apnea, unspecified: Secondary | ICD-10-CM | POA: Diagnosis not present

## 2018-01-05 DIAGNOSIS — K130 Diseases of lips: Secondary | ICD-10-CM | POA: Diagnosis not present

## 2018-01-05 DIAGNOSIS — B001 Herpesviral vesicular dermatitis: Secondary | ICD-10-CM | POA: Diagnosis not present

## 2018-01-22 DIAGNOSIS — R0689 Other abnormalities of breathing: Secondary | ICD-10-CM | POA: Diagnosis not present

## 2018-01-22 DIAGNOSIS — I4430 Unspecified atrioventricular block: Secondary | ICD-10-CM | POA: Diagnosis not present

## 2018-01-22 DIAGNOSIS — R457 State of emotional shock and stress, unspecified: Secondary | ICD-10-CM | POA: Diagnosis not present

## 2018-01-22 DIAGNOSIS — I447 Left bundle-branch block, unspecified: Secondary | ICD-10-CM | POA: Diagnosis not present

## 2018-01-22 DIAGNOSIS — I440 Atrioventricular block, first degree: Secondary | ICD-10-CM | POA: Diagnosis not present

## 2018-01-27 DIAGNOSIS — K130 Diseases of lips: Secondary | ICD-10-CM | POA: Diagnosis not present

## 2018-01-27 DIAGNOSIS — R112 Nausea with vomiting, unspecified: Secondary | ICD-10-CM | POA: Diagnosis not present

## 2018-01-27 DIAGNOSIS — E039 Hypothyroidism, unspecified: Secondary | ICD-10-CM | POA: Diagnosis not present

## 2018-01-31 DIAGNOSIS — K130 Diseases of lips: Secondary | ICD-10-CM | POA: Diagnosis not present

## 2018-01-31 DIAGNOSIS — B079 Viral wart, unspecified: Secondary | ICD-10-CM | POA: Diagnosis not present

## 2018-01-31 DIAGNOSIS — T148XXA Other injury of unspecified body region, initial encounter: Secondary | ICD-10-CM | POA: Diagnosis not present

## 2018-01-31 DIAGNOSIS — L821 Other seborrheic keratosis: Secondary | ICD-10-CM | POA: Diagnosis not present

## 2018-02-14 DIAGNOSIS — I429 Cardiomyopathy, unspecified: Secondary | ICD-10-CM | POA: Diagnosis not present

## 2018-02-14 DIAGNOSIS — I428 Other cardiomyopathies: Secondary | ICD-10-CM | POA: Diagnosis not present

## 2018-02-14 DIAGNOSIS — I2510 Atherosclerotic heart disease of native coronary artery without angina pectoris: Secondary | ICD-10-CM | POA: Diagnosis not present

## 2018-02-14 DIAGNOSIS — E7849 Other hyperlipidemia: Secondary | ICD-10-CM | POA: Diagnosis not present

## 2018-02-14 DIAGNOSIS — I447 Left bundle-branch block, unspecified: Secondary | ICD-10-CM | POA: Diagnosis not present

## 2018-02-14 DIAGNOSIS — G4733 Obstructive sleep apnea (adult) (pediatric): Secondary | ICD-10-CM | POA: Diagnosis not present

## 2018-02-14 DIAGNOSIS — N183 Chronic kidney disease, stage 3 (moderate): Secondary | ICD-10-CM | POA: Diagnosis not present

## 2018-02-14 DIAGNOSIS — I119 Hypertensive heart disease without heart failure: Secondary | ICD-10-CM | POA: Diagnosis not present

## 2018-02-14 DIAGNOSIS — E039 Hypothyroidism, unspecified: Secondary | ICD-10-CM | POA: Diagnosis not present

## 2018-02-14 DIAGNOSIS — E668 Other obesity: Secondary | ICD-10-CM | POA: Diagnosis not present

## 2018-03-09 DIAGNOSIS — T148XXA Other injury of unspecified body region, initial encounter: Secondary | ICD-10-CM | POA: Diagnosis not present

## 2018-03-09 DIAGNOSIS — K130 Diseases of lips: Secondary | ICD-10-CM | POA: Diagnosis not present

## 2018-03-09 DIAGNOSIS — L821 Other seborrheic keratosis: Secondary | ICD-10-CM | POA: Diagnosis not present

## 2018-03-09 DIAGNOSIS — B079 Viral wart, unspecified: Secondary | ICD-10-CM | POA: Diagnosis not present

## 2018-03-21 DIAGNOSIS — E039 Hypothyroidism, unspecified: Secondary | ICD-10-CM | POA: Diagnosis not present

## 2018-03-21 DIAGNOSIS — F411 Generalized anxiety disorder: Secondary | ICD-10-CM | POA: Diagnosis not present

## 2018-03-21 DIAGNOSIS — I10 Essential (primary) hypertension: Secondary | ICD-10-CM | POA: Diagnosis not present

## 2018-03-21 DIAGNOSIS — E7800 Pure hypercholesterolemia, unspecified: Secondary | ICD-10-CM | POA: Diagnosis not present

## 2018-03-23 ENCOUNTER — Encounter: Payer: Self-pay | Admitting: Internal Medicine

## 2018-04-08 DIAGNOSIS — B079 Viral wart, unspecified: Secondary | ICD-10-CM | POA: Diagnosis not present

## 2018-04-08 DIAGNOSIS — K130 Diseases of lips: Secondary | ICD-10-CM | POA: Diagnosis not present

## 2018-04-08 DIAGNOSIS — L821 Other seborrheic keratosis: Secondary | ICD-10-CM | POA: Diagnosis not present

## 2018-05-04 DIAGNOSIS — Z1231 Encounter for screening mammogram for malignant neoplasm of breast: Secondary | ICD-10-CM | POA: Diagnosis not present

## 2018-05-04 DIAGNOSIS — Z124 Encounter for screening for malignant neoplasm of cervix: Secondary | ICD-10-CM | POA: Diagnosis not present

## 2018-05-04 DIAGNOSIS — N958 Other specified menopausal and perimenopausal disorders: Secondary | ICD-10-CM | POA: Diagnosis not present

## 2018-05-04 DIAGNOSIS — M816 Localized osteoporosis [Lequesne]: Secondary | ICD-10-CM | POA: Diagnosis not present

## 2018-05-04 DIAGNOSIS — Z6831 Body mass index (BMI) 31.0-31.9, adult: Secondary | ICD-10-CM | POA: Diagnosis not present

## 2018-06-01 DIAGNOSIS — K130 Diseases of lips: Secondary | ICD-10-CM | POA: Diagnosis not present

## 2018-06-01 DIAGNOSIS — B079 Viral wart, unspecified: Secondary | ICD-10-CM | POA: Diagnosis not present

## 2018-06-27 DIAGNOSIS — E7800 Pure hypercholesterolemia, unspecified: Secondary | ICD-10-CM | POA: Diagnosis not present

## 2018-06-27 DIAGNOSIS — Z0000 Encounter for general adult medical examination without abnormal findings: Secondary | ICD-10-CM | POA: Diagnosis not present

## 2018-06-27 DIAGNOSIS — F411 Generalized anxiety disorder: Secondary | ICD-10-CM | POA: Diagnosis not present

## 2018-06-27 DIAGNOSIS — I10 Essential (primary) hypertension: Secondary | ICD-10-CM | POA: Diagnosis not present

## 2018-06-27 DIAGNOSIS — E039 Hypothyroidism, unspecified: Secondary | ICD-10-CM | POA: Diagnosis not present

## 2018-06-27 DIAGNOSIS — R350 Frequency of micturition: Secondary | ICD-10-CM | POA: Diagnosis not present

## 2018-06-27 DIAGNOSIS — R7303 Prediabetes: Secondary | ICD-10-CM | POA: Diagnosis not present

## 2018-07-20 DIAGNOSIS — K130 Diseases of lips: Secondary | ICD-10-CM | POA: Diagnosis not present

## 2018-07-20 DIAGNOSIS — B079 Viral wart, unspecified: Secondary | ICD-10-CM | POA: Diagnosis not present

## 2018-07-26 DIAGNOSIS — L239 Allergic contact dermatitis, unspecified cause: Secondary | ICD-10-CM | POA: Diagnosis not present

## 2018-07-26 DIAGNOSIS — J301 Allergic rhinitis due to pollen: Secondary | ICD-10-CM | POA: Diagnosis not present

## 2018-07-26 DIAGNOSIS — J3089 Other allergic rhinitis: Secondary | ICD-10-CM | POA: Diagnosis not present

## 2018-07-26 DIAGNOSIS — R05 Cough: Secondary | ICD-10-CM | POA: Diagnosis not present

## 2018-07-28 DIAGNOSIS — R05 Cough: Secondary | ICD-10-CM | POA: Diagnosis not present

## 2018-07-28 DIAGNOSIS — J301 Allergic rhinitis due to pollen: Secondary | ICD-10-CM | POA: Diagnosis not present

## 2018-07-28 DIAGNOSIS — L239 Allergic contact dermatitis, unspecified cause: Secondary | ICD-10-CM | POA: Diagnosis not present

## 2018-07-28 DIAGNOSIS — J3089 Other allergic rhinitis: Secondary | ICD-10-CM | POA: Diagnosis not present

## 2018-07-29 DIAGNOSIS — E039 Hypothyroidism, unspecified: Secondary | ICD-10-CM | POA: Diagnosis not present

## 2018-07-29 DIAGNOSIS — J45909 Unspecified asthma, uncomplicated: Secondary | ICD-10-CM | POA: Diagnosis not present

## 2018-07-29 DIAGNOSIS — K130 Diseases of lips: Secondary | ICD-10-CM | POA: Diagnosis not present

## 2018-07-29 DIAGNOSIS — K1379 Other lesions of oral mucosa: Secondary | ICD-10-CM | POA: Diagnosis not present

## 2018-07-29 DIAGNOSIS — I10 Essential (primary) hypertension: Secondary | ICD-10-CM | POA: Diagnosis not present

## 2018-08-16 DIAGNOSIS — R05 Cough: Secondary | ICD-10-CM | POA: Diagnosis not present

## 2018-08-16 DIAGNOSIS — J209 Acute bronchitis, unspecified: Secondary | ICD-10-CM | POA: Diagnosis not present

## 2018-08-16 DIAGNOSIS — R067 Sneezing: Secondary | ICD-10-CM | POA: Diagnosis not present

## 2018-08-30 DIAGNOSIS — K130 Diseases of lips: Secondary | ICD-10-CM | POA: Diagnosis not present

## 2018-08-30 DIAGNOSIS — B079 Viral wart, unspecified: Secondary | ICD-10-CM | POA: Diagnosis not present

## 2018-09-12 DIAGNOSIS — J209 Acute bronchitis, unspecified: Secondary | ICD-10-CM | POA: Diagnosis not present

## 2018-09-12 DIAGNOSIS — R067 Sneezing: Secondary | ICD-10-CM | POA: Diagnosis not present

## 2018-09-12 DIAGNOSIS — R9389 Abnormal findings on diagnostic imaging of other specified body structures: Secondary | ICD-10-CM | POA: Diagnosis not present

## 2018-09-12 DIAGNOSIS — R05 Cough: Secondary | ICD-10-CM | POA: Diagnosis not present

## 2018-09-12 DIAGNOSIS — M545 Low back pain: Secondary | ICD-10-CM | POA: Diagnosis not present

## 2018-09-19 ENCOUNTER — Encounter: Payer: Self-pay | Admitting: Internal Medicine

## 2018-09-19 ENCOUNTER — Ambulatory Visit (INDEPENDENT_AMBULATORY_CARE_PROVIDER_SITE_OTHER): Payer: Medicare Other | Admitting: Internal Medicine

## 2018-09-19 ENCOUNTER — Other Ambulatory Visit: Payer: Self-pay

## 2018-09-19 VITALS — BP 160/98 | HR 77 | Temp 98.1°F | Ht 62.5 in | Wt 181.6 lb

## 2018-09-19 DIAGNOSIS — I10 Essential (primary) hypertension: Secondary | ICD-10-CM | POA: Diagnosis not present

## 2018-09-19 DIAGNOSIS — R0609 Other forms of dyspnea: Secondary | ICD-10-CM | POA: Diagnosis not present

## 2018-09-19 DIAGNOSIS — J45991 Cough variant asthma: Secondary | ICD-10-CM | POA: Insufficient documentation

## 2018-09-19 LAB — CBC WITH DIFFERENTIAL/PLATELET
BASOS PCT: 0.8 % (ref 0.0–3.0)
Basophils Absolute: 0.1 10*3/uL (ref 0.0–0.1)
Eosinophils Absolute: 0.2 10*3/uL (ref 0.0–0.7)
Eosinophils Relative: 1.7 % (ref 0.0–5.0)
HEMATOCRIT: 39 % (ref 36.0–46.0)
HEMOGLOBIN: 13 g/dL (ref 12.0–15.0)
LYMPHS PCT: 36.8 % (ref 12.0–46.0)
Lymphs Abs: 4.6 10*3/uL — ABNORMAL HIGH (ref 0.7–4.0)
MCHC: 33.4 g/dL (ref 30.0–36.0)
MCV: 74.9 fl — AB (ref 78.0–100.0)
Monocytes Absolute: 1 10*3/uL (ref 0.1–1.0)
Monocytes Relative: 8.2 % (ref 3.0–12.0)
NEUTROS ABS: 6.6 10*3/uL (ref 1.4–7.7)
Neutrophils Relative %: 52.5 % (ref 43.0–77.0)
Platelets: 262 10*3/uL (ref 150.0–400.0)
RBC: 5.2 Mil/uL — AB (ref 3.87–5.11)
RDW: 14.8 % (ref 11.5–15.5)
WBC: 12.6 10*3/uL — ABNORMAL HIGH (ref 4.0–10.5)

## 2018-09-19 LAB — BASIC METABOLIC PANEL
BUN: 18 mg/dL (ref 6–23)
CO2: 24 mEq/L (ref 19–32)
CREATININE: 0.9 mg/dL (ref 0.40–1.20)
Calcium: 9.9 mg/dL (ref 8.4–10.5)
Chloride: 106 mEq/L (ref 96–112)
GFR: 61.34 mL/min (ref >60.00)
Glucose, Bld: 91 mg/dL (ref 70–99)
Potassium: 3.6 mEq/L (ref 3.5–5.1)
Sodium: 141 mEq/L (ref 135–145)

## 2018-09-19 LAB — BRAIN NATRIURETIC PEPTIDE: PRO B NATRI PEPTIDE: 65 pg/mL (ref 0.0–100.0)

## 2018-09-19 LAB — TSH: TSH: 1.02 u[IU]/mL (ref 0.35–4.50)

## 2018-09-19 MED ORDER — PANTOPRAZOLE SODIUM 40 MG PO TBEC: 40.0000 mg | DELAYED_RELEASE_TABLET | Freq: Every day | ORAL | 2 refills | Status: DC

## 2018-09-19 MED ORDER — MOMETASONE FURO-FORMOTEROL FUM 100-5 MCG/ACT IN AERO: 2.0000 | INHALATION_SPRAY | Freq: Two times a day (BID) | RESPIRATORY_TRACT | 0 refills | Status: DC

## 2018-09-19 MED ORDER — FAMOTIDINE 20 MG PO TABS: ORAL_TABLET | ORAL | 11 refills | Status: DC

## 2018-09-19 NOTE — Patient Instructions (Addendum)
Dulera 100 Take 2 puffs first thing in am and then another 2 puffs about 12 hours later.   Work on inhaler technique:  relax and gently blow all the way out then take a nice smooth deep breath back in, triggering the inhaler at same time you start breathing in.  Hold for up to 5 seconds if you can. Blow out thru nose. Rinse and gargle with water when done  Only use your albuterol as a rescue medication to be used if you can't catch your breath by resting or doing a relaxed purse lip breathing pattern.  - The less you use it, the better it will work when you need it. - Ok to use up to 2 puffs  every 4 hours if you must but call for immediate appointment if use goes up over your usual need - Don't leave home without it !!  (think of it like the spare tire for your car)    Pantoprazole (protonix) 40 mg   Take  30-60 min before first meal of the day and Pepcid (famotidine)  20 mg one after supper until return to office - this is the best way to tell whether stomach acid is contributing to your problem.  GERD (REFLUX)  is an extremely common cause of respiratory symptoms just like yours , many times with no obvious heartburn at all.    It can be treated with medication, but also with lifestyle changes including elevation of the head of your bed (ideally with 6 -8inch blocks under the headboard of your bed),  Smoking cessation, avoidance of late meals, excessive alcohol, and avoid fatty foods, chocolate, peppermint, colas, red wine, and acidic juices such as orange juice.  NO MINT OR MENTHOL PRODUCTS SO NO COUGH DROPS  USE SUGARLESS CANDY INSTEAD (Jolley ranchers or Stover's or Life Savers) or even ice chips will also do - the key is to swallow to prevent all throat clearing. NO OIL BASED VITAMINS - use powdered substitutes.  Avoid fish oil when coughing.   Ok to use Nyquil as needed    Please remember to go to the lab department   for your tests - we will call you with the results when they are  available.      Please schedule a follow up office visit in 2 weeks, sooner if needed  - bring all inhalers with you to each office visit.

## 2018-09-19 NOTE — Progress Notes (Signed)
Laura [name], female    DOB: Nov 05, 1945, 73 y.o.   MRN: [id number]   Brief patient profile:  71 yowf never smoker last seen in pulmonary clinic by Dr Annamaria Boots:  10/01/14 noncompliant with cpap and referred to pulmonary clinic 09/19/2018 by Dr Samara Snide re: recurrent cough and sob.   Pt developed allergies/asthma in her 35's (maybe even back further but never saw specialist) had sinus surgery in 1985 and saw Dr Minette Brine shots seemed to helped x maybe 1.5 years much less need for inhalers (some symptoms but never inhalers) then around 2017 worse trouble with sob and cough different from prior years but still only used intermitterently until  December 2019 and never better since with sooner after lying down nightly and forced to sleep in 60 recliner / has not been able to tol cpap as above.    History of Present Illness  09/19/2018  Pulmonary/ 1st office eval/Katheryne Gorr  Chief Complaint  Patient presents with  . Pulmonary Consult    Self referral. Pt c/o recurrent bronchitis over the past 3 years. She states at least 3 x per year she gets SOB and cough. She c/o persisent cough and SOB since 09/12/2018. Cough is prod with light yellow sputu. Cough is worse when she lies.   Dyspnea: indolent onset gradually worse to point now [facility] = can't walk 100 yards even at a slow pace at a flat grade s stopping due to sob   Cough:  Almost immediate hs / light yellow  Sleep: 60 degrees in recliner SABA use: albuterol no helping  sev rounds of prednisone smidge better transiently "but not like in past when worked well"   No obvious day to day or daytime variability or assoc  mucus plugs or hemoptysis or cp or chest tightness, subjective wheeze or overt sinus or hb symptoms.    . Also denies any obvious fluctuation of symptoms with weather or environmental changes or other aggravating or alleviating factors except as outlined above   No unusual exposure hx or h/o childhood pna/ asthma or knowledge of  premature birth.  Current Allergies, Complete Past Medical History, Past Surgical History, Family History, and Social History were reviewed in [employer] record.  ROS  The following are not active complaints unless bolded Hoarseness, sore throat, dysphagia, dental problems, itching, sneezing,  nasal congestion or discharge of excess mucus or purulent secretions, ear ache,   fever, chills, sweats, unintended wt loss or wt gain, classically pleuritic or exertional cp,  orthopnea pnd or arm/hand swelling  or leg swelling, presyncope, palpitations, abdominal pain, anorexia, nausea, vomiting, diarrhea  or change in bowel habits or change in bladder habits, change in stools or change in urine, dysuria, hematuria,  rash, arthralgias, visual complaints, headache, numbness, weakness or ataxia or problems with walking or coordination,  change in mood or  memory.                Past Medical History:  Diagnosis Date  . Anemia   . Anxiety   . Asthma    related to sesonal allergies  . Chronic combined systolic and diastolic CHF, NYHA class 2 ([place]) CARDIOLOGIST-  DR JOINOMVE  . Depression   . History of kidney stones   . History of non-ST elevation myocardial infarction (NSTEMI) JUNE 2011   SECONDARY TO TAKOTSUDO SYNDROME (CARDIAC CATH NORMAL)  . Hyperlipemia   . Hypertension   . Hypoglycemia   . Hypothyroidism   . LBBB (left  bundle branch block)   . Left ventricular ejection fraction less than 40%    38% PER CARDIOLOGIST NOTE (DR CROITORU)  . Memory loss   . Mood swings   . Nonischemic dilated cardiomyopathy ([place])    MODERATELY DEPRESSED LVF;EF 35-45% by Echo 05/27/11  . OSA (obstructive sleep apnea) MODERATE PER STUDY 2005   CPAP NONCOMPLIANT  . Seasonal allergies   . SUI (stress urinary incontinence, female)     Outpatient Medications Prior to Visit  Medication Sig Dispense Refill  . albuterol (PROVENTIL HFA;VENTOLIN HFA) 108 (90 BASE) MCG/ACT inhaler Inhale 2  puffs into the lungs as needed.     Marland Kitchen atorvastatin (LIPITOR) 80 MG tablet TAKE 1/2 TABLET BY MOUTH DAILY FOR CHOLESTEROL 45 tablet 3  . bumetanide (BUMEX) 1 MG tablet ALTERNATE ONE TABLET WITH TWO TABLETS (Patient not taking: Reported on 08/05/2017) 135 tablet 3  . cetirizine (ZYRTEC) 10 MG tablet Take 10 mg by mouth as needed. Take 1/2 tab daily    . diazepam (VALIUM) 5 MG tablet Take 2 tablets (10 mg total) by mouth every 12 (twelve) hours as needed for anxiety. 20 tablet 0  . DULoxetine (CYMBALTA) 60 MG capsule Take 60 mg by mouth daily.    Marland Kitchen levothyroxine (SYNTHROID, LEVOTHROID) 88 MCG tablet Take 88 mcg by mouth daily before breakfast.    . LORazepam (ATIVAN) 1 MG tablet Take 1 mg by mouth as needed for anxiety.    Marland Kitchen losartan (COZAAR) 100 MG tablet Take 1 tablet (100 mg total) by mouth daily. 90 tablet 3  . metoprolol succinate (TOPROL-XL) 50 MG 24 hr tablet Take 1 tablet (50 mg total) by mouth every morning. 90 tablet 3  . NUVIGIL 250 MG tablet Take 250 mg by mouth as needed.  3  . potassium chloride (KLOR-CON M10) 10 MEQ tablet Take 1 tablet (10 mEq total) by mouth 2 (two) times daily. (Patient not taking: Reported on 08/05/2017) 180 tablet 3      Objective:     BP (!) 160/98 (BP Location: Left Arm, Cuff Size: Normal)   Pulse 77   Temp 98.1 F (36.7 C) (Oral)   Ht 5' 2.5" (1.588 m)   Wt 181 lb 9.6 oz (82.4 kg)   SpO2 97%   BMI 32.69 kg/m    amb wf with incessant cough and resting tachypnea      09/19/18 181 lb 9.6 oz (82.4 kg)  08/05/17 186 lb 6.4 oz (84.6 kg)  08/03/17 188 lb 11.4 oz (85.6 kg)     HEENT: nl dentition, turbinates bilaterally, and oropharynx. Nl external ear canals without cough reflex   NECK :  without JVD/Nodes/TM/ nl carotid upstrokes bilaterally   LUNGS: no acc muscle use,  Nl contour chest which is clear to A and P bilaterally without cough on insp or exp maneuvers   CV:  RRR  no s3 or murmur or increase in P2, and no edema   ABD:  Mod obese  soft and nontender with limited inspiratory excursion in the supine position. No bruits or organomegaly appreciated, bowel sounds nl  MS:  Nl gait/ ext warm without deformities, calf tenderness, cyanosis or clubbing No obvious joint restrictions   SKIN: warm and dry without lesions    NEURO:  alert, approp, nl sensorium with  no motor or cerebellar deficits apparent.       I personally reviewed   radiology impression as follows:  CXR:   Scarring in  LLL s change vs priors and  overall hyperinflation/ no acute process   Labs ordered/ reviewed:    Chemistry      Component Value Date/Time   NA 141 09/19/2018 1532   K 3.6 09/19/2018 1532   CL 106 09/19/2018 1532   CO2 24 09/19/2018 1532   BUN 18 09/19/2018 1532   CREATININE 0.90 09/19/2018 1532   CREATININE 0.75 05/15/2015 1306      Component Value Date/Time   CALCIUM 9.9 09/19/2018 1532   ALKPHOS 81 08/03/2017 1413   AST 26 08/03/2017 1413   ALT 27 08/03/2017 1413   BILITOT 0.6 08/03/2017 1413        Lab Results  Component Value Date   WBC 12.6 (H) 09/19/2018   HGB 13.0 09/19/2018   HCT 39.0 09/19/2018   MCV 74.9 (L) 09/19/2018   PLT 262.0 09/19/2018          Lab Results  Component Value Date   TSH 1.02 09/19/2018     Lab Results  Component Value Date   PROBNP 65.0 09/19/2018       Sinus MRI  08/19/17  taken with brain scan : Sinuses/Orbits: Negative         Assessment   Cough variant asthma Dx in her 40's ? Pos resp to allergy shots  - 09/19/2018  After extensive coaching inhaler device,  effectiveness =    50% with baseline < 25% so try diulera 100 2bid  - Allergy profile 09/19/2018 >  Eos 0.2 /  IgE    Absence of response to prednisone is against asthma / allergy being the main problem but we can't say she's not responding to saba when she uses them at baseline so poorly so reasonable to try dulera as above and return in 2 weeks if possible(given corona restrictions)  with all meds in hand using a  trust but verify approach to confirm accurate Medication  Reconciliation The principal here is that until we are certain that the  patients are doing what we've asked, it makes no sense to ask them to do more.       DOE (dyspnea on exertion) Gradually worse ex tol  since 2017 / orthopnea since 2020 - trial of max gerd rx 09/19/2018   Symptoms are markedly disproportionate to objective findings and not clear to what extent this is actually a pulmonary  problem but pt does appear to have difficult to sort out respiratory symptoms of unknown origin for which  DDX  = almost all start with A and  include Adherence, Ace Inhibitors, Acid Reflux, Active Sinus Disease, Alpha 1 Antitripsin deficiency, Anxiety masquerading as Airways dz,  ABPA,  Allergy(esp in young), Aspiration (esp in elderly), Adverse effects of meds,  Active smoking or Vaping, A bunch of PE's/clot burden (a few small clots can't cause this syndrome unless there is already severe underlying pulm or vascular dz with poor reserve),  Anemia or thyroid disorder, plus two Bs  = Bronchiectasis and Beta blocker use..and one C= CHF     Adherence is always the initial "prime suspect" and is a multilayered concern that requires a "trust but verify" approach in every patient - starting with knowing how to use medications, especially inhalers, correctly, keeping up with refills and understanding the fundamental difference between maintenance and prns vs those medications only taken for a very short course and then stopped and not refilled.  - see hfa teaching - return with all meds to all visits   ? Acid (or non-acid) GERD > always  difficult to exclude as up to 75% of pts in some series report no assoc GI/ Heartburn symptoms> rec max (24h)  acid suppression and diet restrictions/ reviewed and instructions given in writing.   ? Allergy > check profile pending though lack of resp to prednisone is against this  ? Active sinus dz > neg sinus MRI 08/19/17  noted, may need repeat sinus CT if nasal symptoms persist.  ? Anxiety related to obesity/ depression/deconditioning.  usually at the bottom of this list of usual suspects but should be much higher on this pt's based on H and P and note already on psychotropics and may interfere with adherence and also interpretation of response or lack thereof to symptom management which can be quite subjective.  ? Beta blocker effects > probably not an issue on relatively low doses of toprol but In the setting of respiratory symptoms of unknown etiology,  It is sometimes  preferable to use bystolic, the most beta -1  selective Beta blocker available in sample form, with bisoprolol the most selective generic choice  on the market, at least on a trial basis, to make sure the spillover Beta 2 effects of the less specific Beta blockers are not contributing to this patient's symptoms.   ? Anemia/ thyroid disorder:  Ruled out today - she is a bit microcyctic but nl MCHC so likely a thalasemia minor trait.   ? chf / cardiac asthma > very unlikely with bnp so low though note bp up and has h/o chf with no f/u by Dr C since 08/08/2015    She says bp is up as anxious re eval today and will monitor at home and so defer rx  to Dr Kenton Kingfisher        Total time devoted to counseling  > 50 % of initial 60 min office visit:  review case with pt/husband/ device teaching which extended face to face time for this visit  discussion of options/alternatives/ personally creating written customized instructions  in presence of pt  then going over those specific  Instructions directly with the pt including how to use all of the meds but in particular covering each new medication in detail and the difference between the maintenance= "automatic" meds and the prns using an action plan format for the latter (If this problem/symptom => do that organization reading Left to right).  Please see AVS from this visit for a full list of these instructions  which I personally wrote for this pt and  are unique to this visit.       Essential hypertension Not optimal at present ? White coat hypertension > defer to Dr Kenton Kingfisher and pt will self monitor between ov's     Christinia Gully, MD 09/19/2018

## 2018-09-20 ENCOUNTER — Telehealth: Payer: Self-pay | Admitting: Internal Medicine

## 2018-09-20 ENCOUNTER — Telehealth: Payer: Self-pay

## 2018-09-20 ENCOUNTER — Encounter: Payer: Self-pay | Admitting: Internal Medicine

## 2018-09-20 LAB — RESPIRATORY ALLERGY PROFILE REGION II ~~LOC~~
Allergen, A. alternata, m6: <0.1 kU/L
Allergen, Cedar tree, t12: <0.1 kU/L
Allergen, Cottonwood, t14: <0.1 kU/L
Allergen, D pternoyssinus,d7: <0.1 kU/L
Allergen, Mouse Urine Protein, e78: <0.1 kU/L
Allergen, Mulberry, t76: <0.1 kU/L
Allergen, Oak,t7: <0.1 kU/L
Aspergillus fumigatus, m3: <0.1 kU/L
CLADOSPORIUM HERBARUM (M2) IGE: <0.1 kU/L
CLASS: 0
CLASS: 0
CLASS: 0
CLASS: 0
CLASS: 0
CLASS: 0
CLASS: 0
CLASS: 0
CLASS: 0
CLASS: 0
Class: 0
Class: 0
Class: 0
Class: 0
Class: 0
Class: 0
Class: 0
Class: 0
Class: 0
Class: 0
Class: 0
Class: 0
Class: 0
Class: 0
Cockroach: <0.1 kU/L
D. farinae: <0.1 kU/L
Dog Dander: <0.1 kU/L
IGE (IMMUNOGLOBULIN E), SERUM: 60 kU/L (ref <=114)
Rough Pigweed  IgE: <0.1 kU/L
Timothy Grass: <0.1 kU/L

## 2018-09-20 LAB — INTERPRETATION:

## 2018-09-20 NOTE — Progress Notes (Signed)
Atc, someone answers and then hangs up immediately

## 2018-09-20 NOTE — Progress Notes (Signed)
ATC x 2- someone answers the phone and then hangs up

## 2018-09-20 NOTE — Telephone Encounter (Signed)
Patient would like to keep upcoming appointment for tomorrow-   COVID-19 Pre-Screening Questions:  . Do you currently have a fever? no (yes = cancel and refer to pcp for e-visit) . Have you recently travelled on a cruise, internationally, or to [place], [place], [place], [place], [place], or [place], [place] [employer]) ? no (yes = cancel, stay home, monitor symptoms, and contact pcp or initiate e-visit if symptoms develop) . Have you been in contact with someone that is currently pending confirmation of Covid19 testing or has been confirmed to have the [place] virus?  no (yes = cancel, stay home, away from tested individual, monitor symptoms, and contact pcp or initiate e-visit if symptoms develop) . Are you currently experiencing fatigue or cough? no (yes = pt should be prepared to have a mask placed at the time of their visit).

## 2018-09-20 NOTE — Assessment & Plan Note (Addendum)
Gradually worse ex tol  since 2017 / orthopnea since 2020 - trial of max gerd rx 09/19/2018    Symptoms are markedly disproportionate to objective findings and not clear to what extent this is actually a pulmonary  problem but pt does appear to have difficult to sort out respiratory symptoms of unknown origin for which  DDX  = almost all start with A and  include Adherence, Ace Inhibitors, Acid Reflux, Active Sinus Disease, Alpha 1 Antitripsin deficiency, Anxiety masquerading as Airways dz,  ABPA,  Allergy(esp in young), Aspiration (esp in elderly), Adverse effects of meds,  Active smoking or Vaping, A bunch of PE's/clot burden (a few small clots can't cause this syndrome unless there is already severe underlying pulm or vascular dz with poor reserve),  Anemia or thyroid disorder, plus two Bs  = Bronchiectasis and Beta blocker use..and one C= CHF     Adherence is always the initial "prime suspect" and is a multilayered concern that requires a "trust but verify" approach in every patient - starting with knowing how to use medications, especially inhalers, correctly, keeping up with refills and understanding the fundamental difference between maintenance and prns vs those medications only taken for a very short course and then stopped and not refilled.  - see hfa teaching - return with all meds to all visits   ? Acid (or non-acid) GERD > always difficult to exclude as up to 75% of pts in some series report no assoc GI/ Heartburn symptoms> rec max (24h)  acid suppression and diet restrictions/ reviewed and instructions given in writing.   ? Allergy > check profile pending though lack of resp to prednisone is against this  ? Active sinus dz > neg sinus MRI 08/19/17 noted, may need repeat sinus CT if nasal symptoms persist.    ? Anxiety related to obesity/ depression/deconditioning.  usually at the bottom of this list of usual suspects but should be much higher on this pt's based on H and P and note  already on psychotropics and may interfere with adherence and also interpretation of response or lack thereof to symptom management which can be quite subjective.  ? Beta blocker effects > probably not an issue on relatively low doses of toprol but In the setting of respiratory symptoms of unknown etiology,  It is sometimes  preferable to use bystolic, the most beta -1  selective Beta blocker available in sample form, with bisoprolol the most selective generic choice  on the market, at least on a trial basis, to make sure the spillover Beta 2 effects of the less specific Beta blockers are not contributing to this patient's symptoms.   ? Anemia/ thyroid disorder:  Ruled out today - she is a bit microcyctic but nl MCHC so likely a thalasemia minor trait.   ? chf / cardiac asthma > very unlikely with bnp so low though note bp up and has h/o chf with no f/u by Dr C since 08/08/2015     She says bp is up as anxious re eval today and will monitor at home and so defer rx  to Dr Kenton Kingfisher     Total time devoted to counseling  > 50 % of initial 60 min office visit:  review case with pt/husband/ device teaching which extended face to face time for this visit  discussion of options/alternatives/ personally creating written customized instructions  in presence of pt  then going over those specific  Instructions directly with the pt including how to use  all of the meds but in particular covering each new medication in detail and the difference between the maintenance= "automatic" meds and the prns using an action plan format for the latter (If this problem/symptom => do that organization reading Left to right).  Please see AVS from this visit for a full list of these instructions which I personally wrote for this pt and  are unique to this visit.

## 2018-09-20 NOTE — Assessment & Plan Note (Signed)
Not optimal at present ? White coat hypertension > defer to Dr Kenton Kingfisher and pt will self monitor between ov's

## 2018-09-20 NOTE — Assessment & Plan Note (Signed)
Dx in her 15's ? Pos resp to allergy shots  - 09/19/2018  After extensive coaching inhaler device,  effectiveness =    50% with baseline < 25% so try diulera 100 2bid  - Allergy profile 09/19/2018 >  Eos 0.2 /  IgE    Absence of response to prednisone is against asthma / allergy being the main problem but we can't say she's not responding to saba when she uses them at baseline so poorly so reasonable to try dulera as above and return in 2 weeks if possible(given corona restrictions)  with all meds in hand using a trust but verify approach to confirm accurate Medication  Reconciliation The principal here is that until we are certain that the  patients are doing what we've asked, it makes no sense to ask them to do more.

## 2018-09-20 NOTE — Telephone Encounter (Signed)
Attempted to contact patient, patient answered phone then hung up. Attempted to call back but got voicemail. Will await a call back. Will schedule patient a tele visit with MW as directed on AVS.

## 2018-09-21 ENCOUNTER — Other Ambulatory Visit: Payer: Self-pay

## 2018-09-21 ENCOUNTER — Encounter: Payer: Self-pay | Admitting: Cardiovascular Disease

## 2018-09-21 ENCOUNTER — Ambulatory Visit: Payer: Self-pay | Admitting: Cardiovascular Disease

## 2018-09-21 ENCOUNTER — Ambulatory Visit (INDEPENDENT_AMBULATORY_CARE_PROVIDER_SITE_OTHER): Payer: Medicare Other | Admitting: Cardiovascular Disease

## 2018-09-21 DIAGNOSIS — G4733 Obstructive sleep apnea (adult) (pediatric): Secondary | ICD-10-CM

## 2018-09-21 DIAGNOSIS — E669 Obesity, unspecified: Secondary | ICD-10-CM

## 2018-09-21 DIAGNOSIS — J45909 Unspecified asthma, uncomplicated: Secondary | ICD-10-CM

## 2018-09-21 DIAGNOSIS — Z8679 Personal history of other diseases of the circulatory system: Secondary | ICD-10-CM

## 2018-09-21 DIAGNOSIS — I447 Left bundle-branch block, unspecified: Secondary | ICD-10-CM | POA: Diagnosis not present

## 2018-09-21 DIAGNOSIS — E785 Hyperlipidemia, unspecified: Secondary | ICD-10-CM

## 2018-09-21 DIAGNOSIS — I10 Essential (primary) hypertension: Secondary | ICD-10-CM | POA: Diagnosis not present

## 2018-09-21 MED ORDER — AMLODIPINE BESYLATE 5 MG PO TABS: 5.0000 mg | ORAL_TABLET | Freq: Every day | ORAL | 3 refills | Status: DC

## 2018-09-21 NOTE — Progress Notes (Signed)
ATC still unable to reach the pt b/c someone picks up the phone and then hangs up

## 2018-09-21 NOTE — Progress Notes (Signed)
Cardiology Office Note    Date:  09/21/2018   ID:  Laura [name], DOB September 14, 1945, MRN [id number]  PCP:  Shirline Frees, MD  Cardiologist:  Shelva Majestic, MD   New cardiology evaluation; former patient of Dr. Tollie Eth.  History of Present Illness:  Laura [name] is a 73 y.o. female who presents to the office today to establish cardiology care with me. She is the wife of my patient Mr. Denee Boeder.  Laura [name] is a remote patient of Dr. Sallyanne Kuster.  Apparently, she has a history of left bundle branch block, and apparently was felt to have a Takotsubo cardiomyopathy after she presented with a non-ST segment delegation MI with a peak troponin of 5.33.  She underwent cardiac catheterization by Dr. Sallyanne Kuster on December 09, 2009 and was found to have normal coronary arteries and classic findings of Takotsubo cardiomyopathy with apical ballooning of the left ventricle.  Her EF by echo was 30 to 35%.  At that time she was started on lisinopril, Lasix and low-dose metoprolol.  She also has a history of asthma and bronchitis.  Over the past several years she has been followed by Dr. Tollie Eth for cardiology care.  She has a history of hypertension and has been on [employer] 300 mg, hydralazine 50 mg twice a day, metoprolol succinate 12.5 mg daily.  She has a history of hypothyroidism and has been on levothyroxine 88 mcg.  There also was a history of some anxiety for which she had been given diazepam as well as citalopram.  Upon further questioning, the patient has a history of severe sleep apnea and states that she is had 3 sleep studies with her last being many years ago by Dr. Annamaria Boots.  She reports an AHI initially of 66.  She had been on CPAP therapy remotely remembers a pressure of 14 cm but then became not tolerant to CPAP mask.  As result she later obtained an oral appliance by Dr. Oneal Grout.  However she has had some difficulty with some teeth fractures and and has not used her oral  appliance in well over a year.  She admits to snoring.  Her sleep is nonrestorative.  She was evaluated by Dr. Christinia Gully this week for cough bronchitis/asthma.  She was started on [facility] for asthma.  She was also recommended to take both pantoprazole and famotidine for GERD and its effect on her cough.  She presents for evaluation.  Past Medical History:  Diagnosis Date   Anemia    Anxiety    Asthma    related to sesonal allergies   Chronic combined systolic and diastolic CHF, NYHA class 2 ([place]) CARDIOLOGIST-  DR PRFFMBWG   Depression    History of kidney stones    History of non-ST elevation myocardial infarction (NSTEMI) JUNE 2011   SECONDARY TO TAKOTSUDO SYNDROME (CARDIAC CATH NORMAL)   Hyperlipemia    Hypertension    Hypoglycemia    Hypothyroidism    LBBB (left bundle branch block)    Left ventricular ejection fraction less than 40%    38% PER CARDIOLOGIST NOTE (DR CROITORU)   Memory loss    Mood swings    Nonischemic dilated cardiomyopathy ([place])    MODERATELY DEPRESSED LVF;EF 35-45% by Echo 05/27/11   OSA (obstructive sleep apnea) MODERATE PER STUDY 2005   CPAP NONCOMPLIANT   Seasonal allergies    SUI (stress urinary incontinence, female)     Past Surgical History:  Procedure Laterality Date  ABDOMINAL HYSTERECTOMY  1985   partial   CARDIAC CATHETERIZATION  09-04-1999;  08/25/2004;   12/09/2009  DR CROITORU   NORMAL CORONARIES/  APICAL BALLOONING OF LV CONSISTENT WITH TAKOTSUBO SYMPTOMS/ EF 30-35%   CATARACT EXTRACTION W/ INTRAOCULAR LENS  IMPLANT, BILATERAL     CHOLECYSTECTOMY N/A 09/29/2012   Procedure: LAPAROSCOPIC CHOLECYSTECTOMY WITH INTRAOPERATIVE CHOLANGIOGRAM;  Surgeon: Adin Hector, MD;  Location: [place];  Service: General;  Laterality: N/A;   CYSTOSCOPY N/A 09/19/2012   Procedure: Erlene Quan;  Surgeon: Bernestine Amass, MD;  Location: [facility];  Service: Urology;  Laterality: N/A;   KNEE ARTHROSCOPY W/  MENISCECTOMY  07-27-2011   MEDIAL AND LATERAL   NASAL SEPTUM SURGERY  1980's   PUBOVAGINAL SLING N/A 09/19/2012   Procedure: SUBURETHRAL Janyth Pupa;  Surgeon: Bernestine Amass, MD;  [facility];  Service: Urology;  Laterality: N/A;   RIGHT URETEROSCOPIC STONE EXTRACTION  08-31-2000   TRANSTHORACIC ECHOCARDIOGRAM  05-27-2011  DR CROITORU   MODERATELY DEPRESSED LVF DUE TO GLOBAL HYPOKINESIS AND MARKED SYSTOLIC ASYNCHRONY/ EF 11%/ MILD LEFT ATRIAL DILATATION    Current Medications: Outpatient Medications Prior to Visit  Medication Sig Dispense Refill   albuterol (PROVENTIL HFA;VENTOLIN HFA) 108 (90 BASE) MCG/ACT inhaler Inhale 2 puffs into the lungs as needed.      atorvastatin (LIPITOR) 80 MG tablet TAKE 1/2 TABLET BY MOUTH DAILY FOR CHOLESTEROL 45 tablet 3   bumetanide (BUMEX) 1 MG tablet Take 1 mg by mouth daily as needed.     diazepam (VALIUM) 5 MG tablet Take 2 tablets (10 mg total) by mouth every 12 (twelve) hours as needed for anxiety. 20 tablet 0   famotidine (PEPCID) 20 MG tablet One after supper 30 tablet 11   hydrALAZINE (APRESOLINE) 50 MG tablet Take 50 mg by mouth 2 (two) times daily.     irbesartan (AVAPRO) 300 MG tablet Take 300 mg by mouth daily.     levothyroxine (SYNTHROID, LEVOTHROID) 88 MCG tablet Take 88 mcg by mouth daily before breakfast.     metoprolol succinate (TOPROL-XL) 50 MG 24 hr tablet Take 25 mg by mouth daily. Take with or immediately following a meal.     mometasone-formoterol (DULERA) 100-5 MCG/ACT AERO Inhale 2 puffs into the lungs 2 (two) times daily. 1 Inhaler 0   NUVIGIL 250 MG tablet Take 250 mg by mouth as needed.  3   pantoprazole (PROTONIX) 40 MG tablet Take 1 tablet (40 mg total) by mouth daily. Take 30-60 min before first meal of the day 30 tablet 2   potassium chloride (K-DUR,KLOR-CON) 10 MEQ tablet Take 10 mEq by mouth daily as needed.     bumetanide (BUMEX) 1 MG tablet ALTERNATE ONE TABLET WITH TWO TABLETS  (Patient taking differently: Take 1 mg by mouth daily as needed. ALTERNATE ONE TABLET WITH TWO TABLETS) 135 tablet 3   potassium chloride (KLOR-CON M10) 10 MEQ tablet Take 1 tablet (10 mEq total) by mouth 2 (two) times daily. (Patient taking differently: Take 10 mEq by mouth daily as needed. ) 180 tablet 3   metoprolol succinate (TOPROL-XL) 50 MG 24 hr tablet Take 1 tablet (50 mg total) by mouth every morning. (Patient not taking: Reported on 09/21/2018) 90 tablet 3   No facility-administered medications prior to visit.      Allergies:   Codeine; Ace inhibitors; Citalopram; Fetzima [levomilnacipran]; Lasix [furosemide]; Nsaids; and Xanax xr [alprazolam er]   Social History   Socioeconomic History   Marital  status: Married    Spouse name: Richard   Number of children: 2   Years of education: 12+   Highest education level: Some college, no degree  Occupational History   Occupation: Retired  Scientist, product/process development strain: Not on Training and development officer insecurity:    Worry: Not on file    Inability: Not on Lexicographer needs:    Medical: Not on file    Non-medical: Not on file  Tobacco Use   Smoking status: Never Smoker   Smokeless tobacco: Never Used  Substance and Sexual Activity   Alcohol use: No    Alcohol/week: 0.0 standard drinks   Drug use: No   Sexual activity: Never    Birth control/protection: Post-menopausal  Lifestyle   Physical activity:    Days per week: Not on file    Minutes per session: Not on file   Stress: Not on file  Relationships   Social connections:    Talks on phone: Not on file    Gets together: Not on file    Attends religious service: Not on file    Active member of club or organization: Not on file    Attends meetings of clubs or organizations: Not on file    Relationship status: Not on file  Other Topics Concern   Not on file  Social History Narrative   Lives at home with husband.   Right-handed.   Drinks 2-3  cups caffeine per day.   No regular exercise.     She is married for 55 years to my patient Mr. Tameyah Koch.  She has 2 children and 1 grandchild.  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  She completed 12th grade of education.  She does not exercise.  She does not drink alcohol.  Family History:  The patient's family history includes Fibromyalgia in her brother; Heart attack in her father and paternal grandfather; Heart disease in her brother; Hypertension in her brother and mother; Pneumonia in her mother; Pulmonary embolism in her brother.   Her mother died at age 26 secondary to anaphylaxis.  Her father died at age 16 with a heart attack.  A brother died at age 54 with heart problems.  She has a living sister age 3.  ROS General: Negative; No fevers, chills, or night sweats;  HEENT: Negative; No changes in vision or hearing, sinus congestion, difficulty swallowing Pulmonary: Positive for cough, asthma and bronchitis Cardiovascular: Negative; No chest pain, presyncope, syncope, palpitations GI: Negative; No nausea, vomiting, diarrhea, or abdominal pain GU: Negative; No dysuria, hematuria, or difficulty voiding Musculoskeletal: Negative; no myalgias, joint pain, or weakness Hematologic/Oncology: Negative; no easy bruising, bleeding Endocrine: Negative; no heat/cold intolerance; no diabetes Neuro: Negative; no changes in balance, headaches Skin: Negative; No rashes or skin lesions Psychiatric: Negative; No behavioral problems, depression Sleep: History of OSA, previously documented to be severe.  Remotely used CPAP and remotely had a customized oral appliance.  Currently snores, nocturia 2 times per night, nonrestorative sleep, and she experiences daytime sleepiness.  norestless legs, hypnogognic hallucinations, no cataplexy Other comprehensive 14 point system review is negative.   PHYSICAL EXAM:   VS:  Pulse 73    Ht '5\' 3"'  (1.6 m)    Wt 181 lb  3.2 oz (82.2 kg)    BMI 32.10 kg/m     Blood pressure taken by me is elevated at 158/78.  Wt Readings from Last 3 Encounters:  09/21/18 181 lb 3.2 oz (82.2 kg)  09/19/18 181 lb 9.6 oz (82.4 kg)  08/05/17 186 lb 6.4 oz (84.6 kg)    General: Alert, oriented, no distress.  Skin: normal turgor, no rashes, warm and dry HEENT: Normocephalic, atraumatic. Pupils equal round and reactive to light; sclera anicteric; extraocular muscles intact; Fundi no hemorrhages or exudates. Nose without nasal septal hypertrophy Mouth/Parynx benign; Mallinpatti scale 3 Neck: No JVD, no carotid bruits; normal carotid upstroke Lungs: clear to ausculatation and percussion; no wheezing or rales Chest wall: without tenderness to palpitation Heart: PMI not displaced, regular with occasional ectopy transiently every fourth or fifth beat., s1 s2 normal, 1/6 systolic murmur, no diastolic murmur, no rubs, gallops, thrills, or heaves Abdomen: soft, nontender; no hepatosplenomehaly, BS+; abdominal aorta nontender and not dilated by palpation. Back: no CVA tenderness Pulses 2+ Musculoskeletal: full range of motion, normal strength, no joint deformities Extremities: no clubbing cyanosis or edema, Homan's sign negative  Neurologic: grossly nonfocal; Cranial nerves grossly wnl Psychologic: Normal mood and affect   Studies/Labs Reviewed:   EKG:  EKG is ordered today.  ECG (independently read by me): Sinus rhythm at 73 bpm with PACs, left bundle branch block with repolarization changes.  Recent Labs: BMP Latest Ref Rng & Units 09/19/2018 08/03/2017 08/03/2017  Glucose 70 - 99 mg/dL 91 105(H) 107(H)  BUN 6 - 23 mg/dL '18 18 16  ' Creatinine 0.40 - 1.20 mg/dL 0.90 0.80 0.81  Sodium 135 - 145 mEq/L 141 142 140  Potassium 3.5 - 5.1 mEq/L 3.6 3.8 3.8  Chloride 96 - 112 mEq/L 106 104 104  CO2 19 - 32 mEq/L 24 - 24  Calcium 8.4 - 10.5 mg/dL 9.9 - 9.8     Hepatic Function Latest Ref Rng & Units 08/03/2017 10/25/2015 12/19/2014    Total Protein 6.5 - 8.1 g/dL 7.6 7.0 6.7  Albumin 3.5 - 5.0 g/dL 4.2 4.1 3.7  AST 15 - 41 U/L '26 24 21  ' ALT 14 - 54 U/L '27 24 21  ' Alk Phosphatase 38 - 126 U/L 81 78 69  Total Bilirubin 0.3 - 1.2 mg/dL 0.6 0.3 0.6  Bilirubin, Direct 0.0 - 0.3 mg/dL - - -    CBC Latest Ref Rng & Units 09/19/2018 08/03/2017 08/03/2017  WBC 4.0 - 10.5 K/uL 12.6(H) - 8.0  Hemoglobin 12.0 - 15.0 g/dL 13.0 13.9 13.1  Hematocrit 36.0 - 46.0 % 39.0 41.0 41.0  Platelets 150.0 - 400.0 K/uL 262.0 - 264   Lab Results  Component Value Date   MCV 74.9 (L) 09/19/2018   MCV 78.5 08/03/2017   MCV 79.7 10/25/2015   Lab Results  Component Value Date   TSH 1.02 09/19/2018   Lab Results  Component Value Date   HGBA1C 5.9 (H) 08/23/2014     BNP    Component Value Date/Time   BNP 21.7 05/15/2015 1339    ProBNP    Component Value Date/Time   PROBNP 65.0 09/19/2018 1532     Lipid Panel     Component Value Date/Time   CHOL 253 (H) 08/23/2014 1623   TRIG 373 (H) 08/23/2014 1623   HDL 50 08/23/2014 1623   CHOLHDL 5.1 08/23/2014 1623   VLDL 75 (H) 08/23/2014 1623   LDLCALC 128 (H) 08/23/2014 1623     RADIOLOGY: No results found.   Additional studies/ records that were reviewed today include:  I have reviewed the records of Dr. Viona Gilmore. Tollie Eth.   ECHO Doppler study was in August 2018 which showed  moderate concentric LVH with abnormal septal wall motion due to left bundle branch block.  EF 50%.  Moderate LA dilation.  Mild to moderate MR.  Carotid duplex imaging from April 01, 2017: 1 to 39% right and left proximal internal carotid stenoses  Lexiscan Myoview studies March 25, 2017: Normal perfusion with EF at 43% with abnormal septal motion consistent with left bundle branch block.  The visual EF appeared higher.  It was felt that a low EF was affected by the bundle branch block and  gating artifact due to PVCs.   ASSESSMENT:    1. History of cardiomyopathy: Takotsubo cardiomyopathy 2011    2. Essential hypertension   3. Left bundle branch block   4. OSA (obstructive sleep apnea)   5. Hyperlipidemia with target LDL less than 70   6. Uncomplicated asthma, unspecified asthma severity, unspecified whether persistent   7. Obesity, mild     PLAN:  Laura [name] is a 73 year old female who has remote history of ruling in for a non-ST segment elevation myocardial infarction at which time she was found to have findings consistent with Takotsubo cardiomyopathy.  EF at that time was 30 to 35%.  Coronary angiography revealed normal coronary arteries.  Her cardiomyopathy was nonobstructive.  She was treated with initial medical therapy including ACE inhibition, low-dose beta-blocker therapy as well as diuretic.  Over the last several years she has been followed by Dr. Tollie Eth and on last echo Doppler assessment EF was approximately 50%.  She has chronic left bundle branch block.  She has a history of hypertension and most recently has been managed with hydralazine 50 mg twice a day, Erbe Sartain 300 mg daily in addition to metoprolol succinate 25 mg.  Her blood pressure today is elevated.  Her resting heart rate is in the 70s and she has chronic left bundle branch block.  Apparently, her primary physician had reduced her beta-blocker in the past.  I do not know the specifics as to why this was done.  With her elevated blood pressure I am therefore adding amlodipine 5 mg to her medical regimen.  Alternatively, if she continues to experience heart rate irregularity or blood pressure elevation perhaps additional titration of beta-blocker can be  Undertaken.  She has recently seen Dr. Melvyn Novas for her asthma/bronchitis and chronic cough.  She is now has GERD which may be contributing to her cough and she was initiated on both PPI with pantoprazole and H2 blockade with famotidine.  Upon further review she has a history consistent with severe sleep apnea.  She tells me she had undergone at least 3  sleep studies in the past with her last study many years ago.  By her description her AHI was 66 and she believes she was on a CPAP pressure of 14 cm.  She has not used CPAP for years and recently had been using an oral appliance with some benefit.  However she has not used this in well over a year and had issues regarding her teeth.  I discussed with her the new technology both with the CPAP machine as well as the significant new mask technology for which she may be able to tolerate.  Presently, I am recommending she undergo a follow-up echo Doppler study to reassess systolic and diastolic function chamber dimensions and valvular architecture.  I have recommended a sleep study be undertaken to reassess her previously noted severe sleep apnea.  At present, due to the COVID-19 pandemic, elective studies are not  allowed to be scheduled and these will be scheduled once the situation allows.  She will monitor her blood pressure.  She will contact me if she notes significant swelling with her ankles and she has a previous prescription for bumetanide which she has available to take if needed.  She also has a history of hyperlipidemia and is on atorvastatin 80 mg daily.  I will try to obtain records of recent laboratory. I will see her in 3 months for follow-up evaluation or sooner if problems arise.   Medication Adjustments/Labs and Tests Ordered: Current medicines are reviewed at length with the patient today.  Concerns regarding medicines are outlined above.  Medication changes, Labs and Tests ordered today are listed in the Patient Instructions below. Patient Instructions  Medication Instructions:  Amlodipine 5 mg daily.  If you need a refill on your cardiac medications before your next appointment, please call your pharmacy.   Testing/Procedures: Echocardiogram (2 months) - Your physician has requested that you have an echocardiogram. Echocardiography is a painless test that uses sound waves to create  images of your heart. It provides your doctor with information about the size and shape of your heart and how well your hearts chambers and valves are working. This procedure takes approximately one hour. There are no restrictions for this procedure. This will be performed at our [facility] location - [address], [address].   Follow-Up: At [facility], you and your health needs are our priority.  As part of our continuing mission to provide you with exceptional heart care, we have created designated Provider Care Teams.  These Care Teams include your primary Cardiologist (physician) and Advanced Practice Providers (APPs -  Physician Assistants and Nurse Practitioners) who all work together to provide you with the care you need, when you need it. You will need a follow up appointment in 3 months. You may see Dr.Kingston Shawgo or one of the following Advanced Practice Providers on your designated Care Team: Almyra Deforest, PA-C  Fabian Sharp, [place]        Signed, Shelva Majestic, MD  09/21/2018 3:32 PM    [place] [address] [place], [place],   [address] Phone: [phone]

## 2018-09-21 NOTE — Telephone Encounter (Signed)
LMTCB x1 for pt.  

## 2018-09-21 NOTE — Patient Instructions (Signed)
Medication Instructions:  Amlodipine 5 mg daily.  If you need a refill on your cardiac medications before your next appointment, please call your pharmacy.   Testing/Procedures: Echocardiogram (2 months) - Your physician has requested that you have an echocardiogram. Echocardiography is a painless test that uses sound waves to create images of your heart. It provides your doctor with information about the size and shape of your heart and how well your heart's chambers and valves are working. This procedure takes approximately one hour. There are no restrictions for this procedure. This will be performed at our [facility] location - [address], [address].   Follow-Up: At [facility], you and your health needs are our priority.  As part of our continuing mission to provide you with exceptional heart care, we have created designated Provider Care Teams.  These Care Teams include your primary Cardiologist (physician) and Advanced Practice Providers (APPs -  Physician Assistants and Nurse Practitioners) who all work together to provide you with the care you need, when you need it. You will need a follow up appointment in 3 months. You may see Dr.Kelly or one of the following Advanced Practice Providers on your designated Care Team: Almyra Deforest, [place] . Fabian Sharp, PA-C

## 2018-09-22 NOTE — Telephone Encounter (Signed)
Patient is returning phone call.  Patient phone number is [phone].

## 2018-09-22 NOTE — Telephone Encounter (Signed)
ATC- pt continues to pick up the phone and hang right up   IF Brady MW IN 2 WKS  THANKS

## 2018-09-22 NOTE — Telephone Encounter (Signed)
ATC pt, line rang was picked up then hung up x3. Will try back.

## 2018-09-23 NOTE — Telephone Encounter (Signed)
Spoke with pt. She has been scheduled for a televisit with MW on 10/20/2018. Nothing further was needed.

## 2018-10-19 NOTE — Progress Notes (Signed)
Virtual Visit via Telephone Note  I connected with Laura [name] on 10/20/18 at 10:15 AM EDT by telephone and verified that I am speaking with the correct person using two identifiers.   I discussed the limitations, risks, security and privacy concerns of performing an evaluation and management service by telephone and the availability of in person appointments. I also discussed with the patient that there may be a patient responsible charge related to this service. The patient expressed understanding and agreed to proceed.  History of Present Illness: 89 yowf never smoker last seen in pulmonary clinic by Dr Annamaria Boots:  10/01/14 noncompliant with cpap and referred to pulmonary clinic 09/19/2018 by Dr Samara Snide re: recurrent cough and sob.  Pt developed allergies/asthma in her 71's (maybe even back further but never saw specialist) had sinus surgery in 1985 and saw Dr Minette Brine shots seemed to helped x maybe 1.5 years much less need for inhalers (some symptoms but never inhalers) then around 2017 worse trouble with sob and cough different from prior years but still only used intermitterently until  December 2019 and never better since with sooner after lying down nightly and forced to sleep in 60 recliner / has not been able to tol cpap as above.  Patient consented to consult via telephone: Yes  People present and their role in pt care: Pt   Chief complaint: Cough variant asthma  73 year old female never smoker followed in our office for cough and suspected cough variant asthma.  Patient was last seen by Dr. Melvyn Novas on 09/19/2018 for an initial consult for the patient's chronic cough.  At that point in time patient obtained blood work in our clinic to assess for allergies, no spirometry was performed due to COVID-19 restrictions, and patient was started on max GERD treatment (Protonix as well as Pepcid).  Patient was also given a Dulera 100 inhaler to start using.  Patient reports that she never started  taking the Dulera 100 inhaler.  Patient reports that her cough has completely gone away without the use of the inhaler.  Patient has been adherent to her GERD treatment.  She attributes her success and resolution of cough to the Protonix as well as Pepcid.  Patient has history of allergies but does not manage on a daily antihistamine.  Patient also reports that in 1990s or 1980s she had surgery to correct a deviated septum.  She reports that she has had extensive follow-up with allergist before.  After seeing Dr. Melvyn Novas in clinic patient then went to go see her cardiologist who started her on amlodipine 5 mg for management of blood pressure.  She reports that her blood pressure has been more stable since then.  Patient also has concerns regarding chest x-ray results that she obtained from [facility]  She reports that they recommended that she follow-up with a pulmonologist.  We do not have these records on file here.  Last chest x-ray result in our system showing 11/2017 showing unchanged peripheral scar of left lung and no active cardiopulmonary disease.   10/20/18 - Cough ROS:  When to the symptoms start: Around 9 months ago How are you today: Cough has completely resolved  Have you had fever/sore throat (first 5 to 7 days of URI) or Have you had cough/nasal congestion (10 to 14 days of URI) : No current symptoms Have you used anything to treat the cough, as anything improved : Protonix and Pepcid Is it a dry or wet cough: It was productive Does  the cough happen when your breathing or when you breathe out: Unsure Other any triggers to your cough, or any aggravating factors: Unsure  Daily antihistamine: none GERD treatment: Protonix / Pepcid Singulair: none  Cough checklist (bolded indicates presence):  Adherence, acid reflux, ACE inhibitor, ?active sinus disease, active smoking, adverse effects of medications (amiodarone/Macrodantin/bb), alpha 1, allergies, aspiration, anxiety,  ?bronchiectasis, congestive heart failure (diastolic)   With patient's occasional productive cough especially with exacerbating fits that require recurrent antibiotics and occasional prednisone she may have bronchiectasis especially with chronic postnasal drip and sinus disease, will monitor clinically at this point in time, can evaluate with a CT if symptoms worsen   Observations/Objective:  Sinus MRI  08/19/17  taken with brain scan : Sinuses/Orbits: Negative      09/19/2018-CBC with differential-eosinophils 0.2, eosinophils relative 1.7  09/19/2018-respiratory allergy panel- no elevations, IgE 60  12/06/2017-chest x-ray - unchanged peripheral scar on left lung, no active cardiopulmonary disease  No results found for: NITRICOXIDE  Assessment and Plan:  Essential hypertension Patient reporting that blood pressure has improved since cardiology added amlodipine 5 mg last month  Plan: Continue follow-up with primary care as well as with cardiology  Cough variant asthma Assessment: Improved symptoms with max GERD treatment Allergy profile negative, IgE 60 CBC with differential showing eosinophils 0.2 Patient never started Dulera 100 inhaler  Plan: We will continue to monitor cough at this time his symptoms have resolved We will have patient follow-up in 3 months to see Dr. Melvyn Novas, could consider spirometry at that time if symptoms have worsened  Abnormal chest x-ray Assessment: Patient reporting that she has had abnormal chest x-rays at [facility], patient is unsure what is abnormal but her chest x-ray we do not have these results Last chest x-ray in our chart was from June/2019 that shows unchanged peripheral scar of left lung as well as no active cardiopulmonary disease  Plan: Patient [facility] to have them faxed over to our office Continue to monitor clinically Follow-up in 3 months with Dr. Melvyn Novas  Follow Up Instructions:  Return  in about 3 months (around 01/19/2019), or if symptoms worsen or fail to improve, for Follow up with Dr. Melvyn Novas.  I discussed the assessment and treatment plan with the patient. The patient was provided an opportunity to ask questions and all were answered. The patient agreed with the plan and demonstrated an understanding of the instructions.   The patient was advised to call back or seek an in-person evaluation if the symptoms worsen or if the condition fails to improve as anticipated.  I provided 35 minutes of non-face-to-face time during this encounter.   Lauraine Rinne, NP

## 2018-10-20 ENCOUNTER — Encounter: Payer: Self-pay | Admitting: Pulmonary Disease

## 2018-10-20 ENCOUNTER — Ambulatory Visit: Payer: Self-pay | Admitting: Internal Medicine

## 2018-10-20 ENCOUNTER — Ambulatory Visit (INDEPENDENT_AMBULATORY_CARE_PROVIDER_SITE_OTHER): Payer: Medicare Other | Admitting: Pulmonary Disease

## 2018-10-20 ENCOUNTER — Other Ambulatory Visit: Payer: Self-pay

## 2018-10-20 ENCOUNTER — Ambulatory Visit: Payer: Self-pay | Admitting: Pulmonary Disease

## 2018-10-20 DIAGNOSIS — R9389 Abnormal findings on diagnostic imaging of other specified body structures: Secondary | ICD-10-CM | POA: Diagnosis not present

## 2018-10-20 DIAGNOSIS — I10 Essential (primary) hypertension: Secondary | ICD-10-CM

## 2018-10-20 DIAGNOSIS — J45991 Cough variant asthma: Secondary | ICD-10-CM

## 2018-10-20 NOTE — Assessment & Plan Note (Signed)
Assessment: Patient reporting that she has had abnormal chest x-rays at [facility], patient is unsure what is abnormal but her chest x-ray we do not have these results Last chest x-ray in our chart was from June/2019 that shows unchanged peripheral scar of left lung as well as no active cardiopulmonary disease  Plan: Patient request records from [facility] to have them faxed over to our office Continue to monitor clinically Follow-up in 3 months with Dr. Melvyn Novas

## 2018-10-20 NOTE — Assessment & Plan Note (Signed)
Assessment: Improved symptoms with max GERD treatment Allergy profile negative, IgE 60 CBC with differential showing eosinophils 0.2 Patient never started Dulera 100 inhaler  Plan: We will continue to monitor cough at this time his symptoms have resolved We will have patient follow-up in 3 months to see Dr. Melvyn Novas, could consider spirometry at that time if symptoms have worsened

## 2018-10-20 NOTE — Assessment & Plan Note (Signed)
Patient reporting that blood pressure has improved since cardiology added amlodipine 5 mg last month  Plan: Continue follow-up with primary care as well as with cardiology

## 2018-10-20 NOTE — Patient Instructions (Addendum)
Please start taking a daily antihistamine:  >>>choose one of: zyrtec, claritin, allegra, or xyzal  >>>these are over the counter medications  >>>can choose generic option  >>>take daily  >>>this medication helps with allergies, post nasal drip, and cough   Continue Protonix 40 mg tablet  >>>Please take 1 tablet daily 15 minutes to 30 minutes before your first meal of the day as well as before your other medications >>>Try to take at the same time each day >>>take this medication daily  Continue Pepcid 20mg  tablet take at night   GERD management: >>>Avoid laying flat until 2 hours after meals >>>Elevate head of the bed including entire chest >>>Reduce size of meals and amount of fat, acid, spices, caffeine and sweets >>>If you are smoking, Please stop! >>>Decrease alcohol consumption >>>Work on maintaining a healthy weight with normal BMI   Do not need to start Dulera 100, hold on to it at home   [facility] - inform them to send [place] your chest xray results   [facility]  Phone # [phone] Fax # [phone]   Return in about 3 months (around 01/19/2019), or if symptoms worsen or fail to improve, for Follow up with Dr. Melvyn Novas.   Coronavirus (COVID-19) Are you at risk?  Are you at risk for the Coronavirus (COVID-19)?  To be considered HIGH RISK for Coronavirus (COVID-19), you have to meet the following criteria:  . Traveled to [place], [place], [place], [place] or [place]; or in the [place] to [place], [place], [place], or [place]; and have fever, cough, and shortness of breath within the last 2 weeks of travel OR . Been in close contact with a person diagnosed with COVID-19 within the last 2 weeks and have fever, cough, and shortness of breath . IF YOU DO NOT MEET THESE CRITERIA, YOU ARE CONSIDERED LOW RISK FOR COVID-19.  What to do if you are HIGH RISK for COVID-19?  Marland Kitchen If you are having a medical emergency, call 911. . Seek medical  care right away. Before you go to a doctor's office, urgent care or emergency department, call ahead and tell them about your recent travel, contact with someone diagnosed with COVID-19, and your symptoms. You should receive instructions from your physician's office regarding next steps of care.  . When you arrive at healthcare provider, tell the healthcare staff immediately you have returned from visiting [place], [place], [place], [place] or [place]; or traveled in the [place] to [place], [place], [place], or [place]; in the last two weeks or you have been in close contact with a person diagnosed with COVID-19 in the last 2 weeks.   . Tell the health care staff about your symptoms: fever, cough and shortness of breath. . After you have been seen by a medical provider, you will be either: o Tested for (COVID-19) and discharged home on quarantine except to seek medical care if symptoms worsen, and asked to  - Stay home and avoid contact with others until you get your results (4-5 days)  - Avoid travel on public transportation if possible (such as bus, train, or airplane) or o Sent to the Emergency Department by EMS for evaluation, COVID-19 testing, and possible admission depending on your condition and test results.  What to do if you are LOW RISK for COVID-19?  Reduce your risk of any infection by using the same precautions used for avoiding the common cold or flu:  .  Wash your hands often with soap and warm water for at least 20 seconds.  If soap and water are not readily available, use an alcohol-based hand sanitizer with at least 60% alcohol.  . If coughing or sneezing, cover your mouth and nose by coughing or sneezing into the elbow areas of your shirt or coat, into a tissue or into your sleeve (not your hands). . Avoid shaking hands with others and consider head nods or verbal greetings only. . Avoid touching your eyes, nose, or mouth with unwashed hands.  . Avoid close contact with  people who are sick. . Avoid places or events with large numbers of people in one location, like concerts or sporting events. . Carefully consider travel plans you have or are making. . If you are planning any travel outside or inside the [place], visit the CDC's Travelers' Health webpage for the latest health notices. . If you have some symptoms but not all symptoms, continue to monitor at home and seek medical attention if your symptoms worsen. . If you are having a medical emergency, call 911.   Success / e-Visit: [email or web address]         [facility]: [facility]: [phone]                   [facility]: [phone]           It is flu season:   >>> Best ways to protect herself from the flu: Receive the yearly flu vaccine, practice good hand hygiene washing with soap and also using hand sanitizer when available, eat a nutritious meals, get adequate rest, hydrate appropriately   Please contact the office if your symptoms worsen or you have concerns that you are not improving.   Thank you for choosing [facility] for your healthcare, and for allowing [place] to partner with you on your healthcare journey. I am thankful to be able to provide care to you today.   Wyn Quaker FNP-C

## 2018-10-23 NOTE — Progress Notes (Signed)
Chart and office note reviewed in detail  > agree with a/p as outlined    

## 2018-11-10 ENCOUNTER — Telehealth: Payer: Self-pay | Admitting: Cardiovascular Disease

## 2018-11-10 DIAGNOSIS — I10 Essential (primary) hypertension: Secondary | ICD-10-CM

## 2018-11-10 MED ORDER — SPIRONOLACTONE 25 MG PO TABS: 12.5000 mg | ORAL_TABLET | Freq: Every day | ORAL | 0 refills | Status: DC

## 2018-11-10 NOTE — Telephone Encounter (Signed)
Noted patient "allregic" to ACEi and furosemide. Already on max dose Irbesartan.   Recommendation:  1. STOP taking amlodipine  2. START spironolactone 12.5mg  daily  3. Repeat BMET in 2-3 weeks

## 2018-11-10 NOTE — Telephone Encounter (Signed)
  Pt c/o medication issue:  1. Name of Medication: amLODipine (NORVASC) 5 MG tablet  2. How are you currently taking this medication (dosage and times per day)? As directed  3. Are you having a reaction (difficulty breathing--STAT)?  Lightheaded, headaches, anxiety, bad dreams, some dizziness  4. What is your medication issue? Patient states that she is having headaches on the top of her head when she wakes up in the mornings. These headaches are lasting all through the day and go away toward end of the evening and then come back at night she is also having worsening anxiety and bad dreams. She feels that it may be coming from this medication. Call to discuss her options.

## 2018-11-10 NOTE — Telephone Encounter (Signed)
Any advice for medication issue??  Thank you!

## 2018-11-10 NOTE — Telephone Encounter (Signed)
Pt was started on Amlodipine 5mg  a day and her BP has been doing good 120's/60's..  But she has been having headaches and dizziness when taking it and she read the package insert and she is very anxious about it... she says she always has increased anxiety but feels firmly the Norvasc is causing her to feel bad... she denies any recent illness but says symptoms start not long after she takes it and lasts for several hours.   Will forward to Dr. Claiborne Billings for his review.

## 2018-11-10 NOTE — Telephone Encounter (Signed)
Attempted to contact patient, unable to reach regarding change in medication. Sent new Med to pharmacy- and ordered BMET, left call back number to notify patient of changes.

## 2018-11-11 MED ORDER — SPIRONOLACTONE 25 MG PO TABS: 12.5000 mg | ORAL_TABLET | Freq: Every day | ORAL | 6 refills | Status: DC

## 2018-11-11 NOTE — Telephone Encounter (Signed)
Spoke to patient advised to stop amlodipine and start spirolactone 25 mg 1/2 tablet daily.Advised to have bmet in 2 to 3 weeks.

## 2018-11-11 NOTE — Telephone Encounter (Signed)
° ° °  Patient returned call stating she is not sure if she wants to try spironolactone (ALDACTONE) 25 MG tablet Please call to discuss

## 2018-11-14 ENCOUNTER — Encounter (INDEPENDENT_AMBULATORY_CARE_PROVIDER_SITE_OTHER): Payer: Medicare Other | Admitting: Ophthalmology

## 2018-11-18 ENCOUNTER — Telehealth: Payer: Self-pay | Admitting: Internal Medicine

## 2018-11-18 NOTE — Telephone Encounter (Signed)
Call returned to patient, made aware we do have the paper copy uploaded into epic but we did not receive the disc. She states she will go pick it up and bring it by. Nothing further is needed at this time.

## 2018-12-08 ENCOUNTER — Telehealth (HOSPITAL_COMMUNITY): Payer: Self-pay | Admitting: *Deleted

## 2018-12-08 NOTE — Telephone Encounter (Signed)

## 2018-12-09 ENCOUNTER — Other Ambulatory Visit: Payer: Self-pay

## 2018-12-09 ENCOUNTER — Ambulatory Visit (HOSPITAL_COMMUNITY): Payer: Medicare Other | Attending: Cardiology

## 2018-12-09 DIAGNOSIS — Z8679 Personal history of other diseases of the circulatory system: Secondary | ICD-10-CM | POA: Diagnosis not present

## 2018-12-09 DIAGNOSIS — I10 Essential (primary) hypertension: Secondary | ICD-10-CM | POA: Diagnosis not present

## 2018-12-09 LAB — BASIC METABOLIC PANEL
BUN/Creatinine Ratio: 16 (ref 12–28)
BUN: 15 mg/dL (ref 8–27)
CO2: 24 mmol/L (ref 20–29)
Calcium: 9.5 mg/dL (ref 8.7–10.3)
Chloride: 106 mmol/L (ref 96–106)
Creatinine, Ser: 0.91 mg/dL (ref 0.57–1.00)
GFR calc Af Amer: 72 mL/min/{1.73_m2} (ref >59)
GFR calc non Af Amer: 63 mL/min/{1.73_m2} (ref >59)
Glucose: 95 mg/dL (ref 65–99)
Potassium: 4.5 mmol/L (ref 3.5–5.2)
Sodium: 144 mmol/L (ref 134–144)

## 2018-12-09 MED ORDER — PERFLUTREN LIPID MICROSPHERE
1.0000 mL | INTRAVENOUS | Status: AC | PRN
  Administered 2018-12-09: 2 mL via INTRAVENOUS

## 2018-12-14 ENCOUNTER — Telehealth: Payer: Self-pay | Admitting: Cardiovascular Disease

## 2018-12-15 ENCOUNTER — Telehealth: Payer: Self-pay | Admitting: Cardiovascular Disease

## 2018-12-15 NOTE — Telephone Encounter (Signed)
called x3 for pre reg, sent message to my chart & email

## 2018-12-15 NOTE — Telephone Encounter (Signed)
LMTCB on home and cell vm to see if pt would like to do virtual visit with Dr. Claiborne Billings tomorrow instead of Monday, 6/22.

## 2018-12-16 ENCOUNTER — Telehealth: Payer: Self-pay | Admitting: Physician Assistant

## 2018-12-16 NOTE — Telephone Encounter (Signed)
LVM for patient to confirm appt on 12-19-18

## 2018-12-18 NOTE — Progress Notes (Addendum)
Cardiology Office Note:    Date:  12/19/2018   ID:  Laura [name], DOB [birth year], MRN [id number]  PCP:  Shirline Frees, MD  Cardiologist:  Shelva Majestic, MD   Referring MD: Shirline Frees, MD   Chief Complaint  Patient presents with  . Follow-up  fatigue  History of Present Illness:    Laura [name] is a 73 y.o. female with a hx of HTN, HLD, anxiety/depression, OSA not on CPAP, takotsubo cardiomyopathy 11/2009 cath with normal coronaries (EF at that time was 30-35%), nonischemic cardiomyopathy, recent echo 12/09/18 with EF of 78%, diastolic dysfunction, septal-lateral dyssynchrony/LBBB. She has followed with Dr. Sallyanne Kuster and then Dr. Wynonia Lawman in the past. She recently reestablished care with Dr. Claiborne Billings on 09/21/18.  HTN managed with hydralazine 50 mg BID, irbesartan 300 mg daily, lopressor 25 mg, and norvasc 5 mg was added during her appt with Dr. Claiborne Billings. Repeat sleep study is pending (pandemic).  She returns today for follow up. She did not tolerate norvasc (headache). She was prescribed spironolactone. Her blood pressure log with labile pressures, but generally well-controlled. Unfortunately, she has multiple complaints. She states she can't work in the yard because she's too tired. Her legs hurt, she gets red-faced working in the yard. But she doesn't "care for anything."  This has been going on for years. She has many complaints of being tired and DOE. She can't walk her dog and doesn't feel like walking. She also states that she has had chest pain since 2014 which also coincided with her lap chole. She states this CP occurs with eating and is felt in the epigastric region. She saw Dr. Paulita Fujita who recommended vitamins.    She is only taking hydralazine once per day in the morning, but it does not look like she is having rebound hypertension in the evenings. She does not complain of exertional chest pain, but does have DOE. She appears euvolemic.   She also states she was told she was  pre-diabetic, I do not see a recent A1c.   Overall, she has high anxiety.  Past Medical History:  Diagnosis Date  . Anemia   . Anxiety   . Asthma    related to sesonal allergies  . Chronic combined systolic and diastolic CHF, NYHA class 2 ([place]) CARDIOLOGIST-  DR IONGEXBM  . Depression   . History of kidney stones   . History of non-ST elevation myocardial infarction (NSTEMI) JUNE 2011   SECONDARY TO TAKOTSUDO SYNDROME (CARDIAC CATH NORMAL)  . Hyperlipemia   . Hypertension   . Hypoglycemia   . Hypothyroidism   . LBBB (left bundle branch block)   . Left ventricular ejection fraction less than 40%    38% PER CARDIOLOGIST NOTE (DR CROITORU)  . Memory loss   . Mood swings   . Nonischemic dilated cardiomyopathy ([place])    MODERATELY DEPRESSED LVF;EF 35-45% by Echo 05/27/11  . OSA (obstructive sleep apnea) MODERATE PER STUDY 2005   CPAP NONCOMPLIANT  . Seasonal allergies   . SUI (stress urinary incontinence, female)     Past Surgical History:  Procedure Laterality Date  . ABDOMINAL HYSTERECTOMY  1985   partial  . CARDIAC CATHETERIZATION  09-04-1999;  08/25/2004;   12/09/2009  DR CROITORU   NORMAL CORONARIES/  APICAL BALLOONING OF LV CONSISTENT WITH TAKOTSUBO SYMPTOMS/ EF 30-35%  . CATARACT EXTRACTION W/ INTRAOCULAR LENS  IMPLANT, BILATERAL    . CHOLECYSTECTOMY N/A 09/29/2012   Procedure: LAPAROSCOPIC CHOLECYSTECTOMY WITH INTRAOPERATIVE CHOLANGIOGRAM;  Surgeon: Adin Hector,  MD;  Location: [facility];  Service: General;  Laterality: N/A;  . CYSTOSCOPY N/A 09/19/2012   Procedure: Erlene Quan;  Surgeon: Bernestine Amass, MD;  Location[facility];  Service: Urology;  Laterality: N/A;  . KNEE ARTHROSCOPY W/ MENISCECTOMY  07-27-2011   MEDIAL AND LATERAL  . NASAL SEPTUM SURGERY  1980's  . PUBOVAGINAL SLING N/A 09/19/2012   Procedure: SUBURETHRAL Janyth Pupa;  Surgeon: Bernestine Amass, MD;  Location: [facility];  Service: Urology;  Laterality: N/A;  . RIGHT  URETEROSCOPIC STONE EXTRACTION  08-31-2000  . TRANSTHORACIC ECHOCARDIOGRAM  05-27-2011  DR CROITORU   MODERATELY DEPRESSED LVF DUE TO GLOBAL HYPOKINESIS AND MARKED SYSTOLIC ASYNCHRONY/ EF 53%/ MILD LEFT ATRIAL DILATATION    Current Medications: Current Meds  Medication Sig  . atorvastatin (LIPITOR) 80 MG tablet TAKE 1/2 TABLET BY MOUTH DAILY FOR CHOLESTEROL  . diazepam (VALIUM) 5 MG tablet Take 2 tablets (10 mg total) by mouth every 12 (twelve) hours as needed for anxiety.  . famotidine (PEPCID) 20 MG tablet One after supper  . hydrALAZINE (APRESOLINE) 50 MG tablet Take 50 mg by mouth 2 (two) times daily.  . irbesartan (AVAPRO) 300 MG tablet Take 300 mg by mouth daily.  Marland Kitchen levothyroxine (SYNTHROID, LEVOTHROID) 88 MCG tablet Take 88 mcg by mouth daily before breakfast.  . metoprolol succinate (TOPROL-XL) 50 MG 24 hr tablet Take 25 mg by mouth daily. Take with or immediately following a meal.  . mometasone-formoterol (DULERA) 100-5 MCG/ACT AERO Inhale 2 puffs into the lungs 2 (two) times daily.  Marland Kitchen NUVIGIL 250 MG tablet Take 250 mg by mouth as needed.  . pantoprazole (PROTONIX) 40 MG tablet Take 1 tablet (40 mg total) by mouth daily. Take 30-60 min before first meal of the day  . spironolactone (ALDACTONE) 25 MG tablet Take 0.5 tablets (12.5 mg total) by mouth daily.  . [DISCONTINUED] albuterol (PROVENTIL HFA;VENTOLIN HFA) 108 (90 BASE) MCG/ACT inhaler Inhale 2 puffs into the lungs as needed.   . [DISCONTINUED] bumetanide (BUMEX) 1 MG tablet Take 1 mg by mouth daily as needed.  . [DISCONTINUED] potassium chloride (K-DUR,KLOR-CON) 10 MEQ tablet Take 10 mEq by mouth daily as needed.     Allergies:   Codeine, Ace inhibitors, Citalopram, Fetzima [levomilnacipran], Lasix [furosemide], Nsaids, and Xanax xr [alprazolam er]   Social History   Socioeconomic History  . Marital status: Married    Spouse name: Richard  . Number of children: 2  . Years of education: 12+  . Highest education level:  Some college, no degree  Occupational History  . Occupation: Retired  Scientific laboratory technician  . Financial resource strain: Not on file  . Food insecurity    Worry: Not on file    Inability: Not on file  . Transportation needs    Medical: Not on file    Non-medical: Not on file  Tobacco Use  . Smoking status: Never Smoker  . Smokeless tobacco: Never Used  Substance and Sexual Activity  . Alcohol use: No    Alcohol/week: 0.0 standard drinks  . Drug use: No  . Sexual activity: Never    Birth control/protection: Post-menopausal  Lifestyle  . Physical activity    Days per week: Not on file    Minutes per session: Not on file  . Stress: Not on file  Relationships  . Social Herbalist on phone: Not on file    Gets together: Not on file    Attends religious service:  Not on file    Active member of club or organization: Not on file    Attends meetings of clubs or organizations: Not on file    Relationship status: Not on file  Other Topics Concern  . Not on file  Social History Narrative   Lives at home with husband.   Right-handed.   Drinks 2-3 cups caffeine per day.   No regular exercise.     Family History: The patient's family history includes Fibromyalgia in her brother; Heart attack in her father and paternal grandfather; Heart disease in her brother; Hypertension in her brother and mother; Pneumonia in her mother; Pulmonary embolism in her brother.  ROS:   Please see the history of present illness.     All other systems reviewed and are negative.  EKGs/Labs/Other Studies Reviewed:    The following studies were reviewed today:  Echo 12/09/18: 1. The left ventricle has a visually estimated ejection fraction of 45%. The cavity size was normal. There is moderately increased left ventricular wall thickness. Left ventricular diastolic Doppler parameters are consistent with impaired relaxation.  Left ventrical global hypokinesis without regional wall motion abnormalities.  Septal-lateral dyssynchrony consistent with LBBB.  2. The right ventricle has normal systolic function. The cavity was normal. There is no increase in right ventricular wall thickness.  3. Left atrial size was mildly dilated.  4. No evidence of mitral valve stenosis. Trivial mitral regurgitation.  5. The aortic valve is tricuspid. Mild calcification of the aortic valve. No stenosis of the aortic valve.  6. The aortic root is normal in size and structure.  7. The IVC was normal in size. No complete TR doppler jet so unable to estimate PA systolic pressure.  EKG:  EKG is ordered today.  The ekg ordered today demonstrates sinus rhythm HR 69, with PVCs, LBBB (old)  Recent Labs: 09/19/2018: Hemoglobin 13.0; Platelets 262.0; Pro B Natriuretic peptide (BNP) 65.0; TSH 1.02 12/09/2018: BUN 15; Creatinine, Ser 0.91; Potassium 4.5; Sodium 144  Recent Lipid Panel    Component Value Date/Time   CHOL 253 (H) 08/23/2014 1623   TRIG 373 (H) 08/23/2014 1623   HDL 50 08/23/2014 1623   CHOLHDL 5.1 08/23/2014 1623   VLDL 75 (H) 08/23/2014 1623   LDLCALC 128 (H) 08/23/2014 1623    Physical Exam:    VS:  BP (!) 144/92   Pulse 70   Temp 98.4 F (36.9 C)   Ht 5\' 3"  (1.6 m)   Wt 182 lb 12.8 oz (82.9 kg)   SpO2 98%   BMI 32.38 kg/m     Wt Readings from Last 3 Encounters:  12/19/18 182 lb 12.8 oz (82.9 kg)  09/21/18 181 lb 3.2 oz (82.2 kg)  09/19/18 181 lb 9.6 oz (82.4 kg)     GEN: Well nourished, well developed in no acute distress HEENT: Normal NECK: No JVD; No carotid bruits CARDIAC: irregular rhythm, regular rate, no murmurs, rubs, gallops RESPIRATORY:  Clear to auscultation without rales, wheezing or rhonchi  ABDOMEN: Soft, non-tender, non-distended MUSCULOSKELETAL:  No edema; No deformity  SKIN: Warm and dry NEUROLOGIC:  Alert and oriented x 3 PSYCHIATRIC:  Normal affect   ASSESSMENT:    1. Fatigue, unspecified type   2. PVC (premature ventricular contraction)   3. History of  cardiomyopathy: Takotsubo cardiomyopathy 2011   4. Takotsubo syndrome, June 2011.(normal coronaries)   5. Essential hypertension   6. OSA (obstructive sleep apnea)   7. Mixed hyperlipidemia   8. Obstructive sleep apnea  PLAN:    In order of problems listed above:  Fatigue  Untreated sleep apnea Obesity Sounds like she is having extreme daytime fatigue due to untreated sleep apnea. Recent labs for these complaints have been normal, including BNP, TSH, Hb, and BMP. It sounds as though she needs to have her sleep apnea treated. She also likely has a component of deconditioning contributing to her fatigue with walking. She does not have cardiac complaints. No symptoms consistent with angina or heart failure. I will message Dr. Claiborne Billings to see when she can proceed with a sleep study. We discussed ways to increase her activity.    Irregular heart beat PVCs I detected irregular rhythm on exam. EKG with sinus rhythm and PVCs. Given her relatively normal workup for fatigue, I will place a zio patch to assess her PVC burden. This could be contributing to her fatigue. May need to titrate her BB.   Hypertension She is maintained on 12.5 mg spironolactone, 300 mg irbesartan, and 25 mg toprol.  She is only taking her hydralazine once per day (50 mg). We discussed BID dosing for this medications. She no longer takes bumex or potassium supplementation.  Will obtain a BMP today.   Nonischemic cardiomyopathy EF 32%, diastolic dysfuction Doing well on spironolactone, no bumex. Euvolemic today.    Hyperlipidemia Last LDL was 76 in 2019. Continue 80 mg lipitor   Follow up with Dr. Claiborne Billings in 3 months.     Medication Adjustments/Labs and Tests Ordered: Current medicines are reviewed at length with the patient today.  Concerns regarding medicines are outlined above.  Orders Placed This Encounter  Procedures  . Basic metabolic panel  . Hemoglobin A1c  . Lipid panel  . LONG TERM MONITOR-LIVE  TELEMETRY (3-14 DAYS)   No orders of the defined types were placed in this encounter.   Signed, Ledora Bottcher, [place]  12/19/2018 12:35 PM    [facility]

## 2018-12-19 ENCOUNTER — Telehealth: Payer: Medicare Other | Admitting: Cardiovascular Disease

## 2018-12-19 ENCOUNTER — Encounter: Payer: Self-pay | Admitting: Physician Assistant

## 2018-12-19 ENCOUNTER — Ambulatory Visit (INDEPENDENT_AMBULATORY_CARE_PROVIDER_SITE_OTHER): Payer: Medicare Other | Admitting: Physician Assistant

## 2018-12-19 ENCOUNTER — Other Ambulatory Visit: Payer: Self-pay

## 2018-12-19 DIAGNOSIS — I5181 Takotsubo syndrome: Secondary | ICD-10-CM

## 2018-12-19 DIAGNOSIS — E782 Mixed hyperlipidemia: Secondary | ICD-10-CM

## 2018-12-19 DIAGNOSIS — I10 Essential (primary) hypertension: Secondary | ICD-10-CM

## 2018-12-19 DIAGNOSIS — Z8679 Personal history of other diseases of the circulatory system: Secondary | ICD-10-CM

## 2018-12-19 DIAGNOSIS — I493 Ventricular premature depolarization: Secondary | ICD-10-CM

## 2018-12-19 DIAGNOSIS — R5383 Other fatigue: Secondary | ICD-10-CM

## 2018-12-19 DIAGNOSIS — G4733 Obstructive sleep apnea (adult) (pediatric): Secondary | ICD-10-CM | POA: Diagnosis not present

## 2018-12-19 NOTE — Patient Instructions (Signed)
Medication Instructions:  Take Hydralazine twice daily  If you need a refill on your cardiac medications before your next appointment, please call your pharmacy.   Lab work: Your physician recommends that you return for a FASTING lipid profile, BMET, and Hemoglobin A1C  If you have labs (blood work) drawn today and your tests are completely normal, you will receive your results only by: Marland Kitchen MyChart Message (if you have MyChart) OR . A paper copy in the mail If you have any lab test that is abnormal or we need to change your treatment, we will call you to review the results.  Testing/Procedures: Your physician has recommended that you wear a 14 day ZIO event monitor (today if possible). Event monitors are medical devices that record the heart's electrical activity. Doctors most often [place] these monitors to diagnose arrhythmias. Arrhythmias are problems with the speed or rhythm of the heartbeat. The monitor is a small, portable device. You can wear one while you do your normal daily activities. This is usually used to diagnose what is causing palpitations/syncope (passing out).   Follow-Up: At [facility], you and your health needs are our priority.  As part of our continuing mission to provide you with exceptional heart care, we have created designated Provider Care Teams.  These Care Teams include your primary Cardiologist (physician) and Advanced Practice Providers (APPs -  Physician Assistants and Nurse Practitioners) who all work together to provide you with the care you need, when you need it. You will need a follow up appointment in 6 months.  Please call our office 2 months in advance to schedule this appointment.  You may see Shelva Majestic, MD or one of the following Advanced Practice Providers on your designated Care Team: [place], [place] . Fabian Sharp, PA-C  Any Other Special Instructions Will Be Listed Below (If Applicable). None

## 2018-12-20 ENCOUNTER — Telehealth: Payer: Self-pay | Admitting: Radiology

## 2018-12-20 LAB — BASIC METABOLIC PANEL
BUN/Creatinine Ratio: 14 (ref 12–28)
BUN: 12 mg/dL (ref 8–27)
CO2: 25 mmol/L (ref 20–29)
Calcium: 9.4 mg/dL (ref 8.7–10.3)
Chloride: 105 mmol/L (ref 96–106)
Creatinine, Ser: 0.83 mg/dL (ref 0.57–1.00)
GFR calc Af Amer: 81 mL/min/{1.73_m2} (ref >59)
GFR calc non Af Amer: 70 mL/min/{1.73_m2} (ref >59)
Glucose: 89 mg/dL (ref 65–99)
Potassium: 4.1 mmol/L (ref 3.5–5.2)
Sodium: 143 mmol/L (ref 134–144)

## 2018-12-20 LAB — LIPID PANEL
Chol/HDL Ratio: 2.8 ratio (ref 0.0–4.4)
Cholesterol, Total: 158 mg/dL (ref 100–199)
HDL: 57 mg/dL (ref >39)
LDL Calculated: 78 mg/dL (ref 0–99)
Triglycerides: 113 mg/dL (ref 0–149)
VLDL Cholesterol Cal: 23 mg/dL (ref 5–40)

## 2018-12-20 LAB — HEMOGLOBIN A1C
Est. average glucose Bld gHb Est-mCnc: 126 mg/dL
Hgb A1c MFr Bld: 6 % — ABNORMAL HIGH (ref 4.8–5.6)

## 2018-12-20 NOTE — Telephone Encounter (Signed)
Enrolled patient for a 14 Day Zio Telemetry monitor to be mailed. Brief instructions were gone over witht he patient and she knows to expect the monitor to arrive in 3-4 days

## 2018-12-21 NOTE — Addendum Note (Signed)
Addended by: Crissie Reese on: 12/21/2018 01:13 PM   Modules accepted: Orders

## 2018-12-25 ENCOUNTER — Ambulatory Visit (INDEPENDENT_AMBULATORY_CARE_PROVIDER_SITE_OTHER): Payer: Medicare Other

## 2018-12-25 DIAGNOSIS — I493 Ventricular premature depolarization: Secondary | ICD-10-CM

## 2018-12-25 DIAGNOSIS — E782 Mixed hyperlipidemia: Secondary | ICD-10-CM | POA: Diagnosis not present

## 2018-12-25 DIAGNOSIS — R5383 Other fatigue: Secondary | ICD-10-CM | POA: Diagnosis not present

## 2018-12-25 DIAGNOSIS — I5181 Takotsubo syndrome: Secondary | ICD-10-CM | POA: Diagnosis not present

## 2018-12-25 DIAGNOSIS — I10 Essential (primary) hypertension: Secondary | ICD-10-CM

## 2018-12-25 DIAGNOSIS — G4733 Obstructive sleep apnea (adult) (pediatric): Secondary | ICD-10-CM | POA: Diagnosis not present

## 2018-12-25 DIAGNOSIS — Z8679 Personal history of other diseases of the circulatory system: Secondary | ICD-10-CM

## 2018-12-27 DIAGNOSIS — H9313 Tinnitus, bilateral: Secondary | ICD-10-CM | POA: Diagnosis not present

## 2018-12-27 DIAGNOSIS — E039 Hypothyroidism, unspecified: Secondary | ICD-10-CM | POA: Diagnosis not present

## 2018-12-27 DIAGNOSIS — F411 Generalized anxiety disorder: Secondary | ICD-10-CM | POA: Diagnosis not present

## 2018-12-27 DIAGNOSIS — I10 Essential (primary) hypertension: Secondary | ICD-10-CM | POA: Diagnosis not present

## 2018-12-27 DIAGNOSIS — J4541 Moderate persistent asthma with (acute) exacerbation: Secondary | ICD-10-CM | POA: Diagnosis not present

## 2018-12-27 DIAGNOSIS — R1011 Right upper quadrant pain: Secondary | ICD-10-CM | POA: Diagnosis not present

## 2018-12-27 DIAGNOSIS — E7800 Pure hypercholesterolemia, unspecified: Secondary | ICD-10-CM | POA: Diagnosis not present

## 2018-12-27 DIAGNOSIS — F331 Major depressive disorder, recurrent, moderate: Secondary | ICD-10-CM | POA: Diagnosis not present

## 2019-01-12 ENCOUNTER — Other Ambulatory Visit: Payer: Self-pay | Admitting: Family Medicine

## 2019-01-12 DIAGNOSIS — R1011 Right upper quadrant pain: Secondary | ICD-10-CM

## 2019-01-19 ENCOUNTER — Other Ambulatory Visit: Payer: Self-pay

## 2019-01-20 ENCOUNTER — Other Ambulatory Visit: Payer: Self-pay

## 2019-01-20 ENCOUNTER — Ambulatory Visit (INDEPENDENT_AMBULATORY_CARE_PROVIDER_SITE_OTHER): Payer: Medicare Other | Admitting: Pulmonary Disease

## 2019-01-20 ENCOUNTER — Encounter: Payer: Self-pay | Admitting: Pulmonary Disease

## 2019-01-20 ENCOUNTER — Telehealth: Payer: Self-pay | Admitting: Physician Assistant

## 2019-01-20 ENCOUNTER — Ambulatory Visit: Payer: Self-pay | Admitting: Internal Medicine

## 2019-01-20 DIAGNOSIS — J45991 Cough variant asthma: Secondary | ICD-10-CM | POA: Diagnosis not present

## 2019-01-20 DIAGNOSIS — R002 Palpitations: Secondary | ICD-10-CM

## 2019-01-20 DIAGNOSIS — K219 Gastro-esophageal reflux disease without esophagitis: Secondary | ICD-10-CM | POA: Diagnosis not present

## 2019-01-20 NOTE — Progress Notes (Signed)
Virtual Visit via Telephone Note  I connected with Laura [name] on 01/20/19 at 10:00 AM EDT by telephone and verified that I am speaking with the correct person using two identifiers.  Location: Patient: Home Provider: [facility] - [address], [place], [place], [place] [address]   I discussed the limitations, risks, security and privacy concerns of performing an evaluation and management service by telephone and the availability of in person appointments. I also discussed with the patient that there may be a patient responsible charge related to this service. The patient expressed understanding and agreed to proceed.  Patient consented to consult via telephone: Yes People present and their role in pt care: Pt     History of Present Illness: 50 yowf never smoker last seen in pulmonary clinic by Dr Annamaria Boots:  10/01/14 noncompliant with cpap and referred to pulmonary clinic 09/19/2018 by Dr Samara Snide re: recurrent cough and sob.  Pt developed allergies/asthma in her 33's (maybe even back further but never saw specialist) had sinus surgery in 1985 and saw Dr Minette Brine shots seemed to helped x maybe 1.5 years much less need for inhalers (some symptoms but never inhalers) then around 2017 worse trouble with sob and cough different from prior years but still only used intermitterently until  December 2019 and never better since with sooner after lying down nightly and forced to sleep in 60 recliner / has not been able to tol cpap as above.  Chief complaint: 3 mo follow up, Cough Variant Asthma   73 year old female followed in our office for cough variant asthma.  Patient last completed tele-visit in April/2020.  Patient was never started on her Dulera 100 inhaler.  She reports that her cough has improved significantly.  She still has an occasional dry cough sporadically..  Patient also has likely diagnosis of acid reflux.  She had an episode last week where she had chest pain which  was partially relieved by Tums.  She is managed on a PPI as well as Pepcid at night.  Patient contacted gastroenterology with [facility] and they encouraged her to complete cardiology follow-ups before they saw her in office.  Patient was also in the process of wearing a 14-day Holter monitor from cardiology because she had an EKG that showed PVCs.  Those results are in the system.  But she has not had follow-up from cardiology yet.  Patient is waiting to follow-up with cardiology as they had discussed they may be changing her medications based off that.  Observations/Objective:  Sinus MRI  08/19/17  taken with brain scan : Sinuses/Orbits: Negative      09/19/2018-CBC with differential-eosinophils 0.2, eosinophils relative 1.7  09/19/2018-respiratory allergy panel- no elevations, IgE 60  12/06/2017-chest x-ray - unchanged peripheral scar on left lung, no active cardiopulmonary disease  12/09/2018 - echocardiogram -ejection fraction 45%, left ventricular global hypokinesis, left atrial size mildly dilated, right ventricle with normal systolic function  11/28/930-TFTD-DUKG monitor telemetry 14 days- minimum heart rate of 48 beats per minutes, maximum heart rate 231 beats per minutes, average heart rate 71, predominant underlying rhythm was sinus rhythm, bundle branch block, V. tach and SVT did occur  Assessment and Plan:  Cough variant asthma Plan: Continue monitor cough Likely contributor to cough management is GERD and acid reflux Patient needs follow-up with gastroenterology Follow-up with Dr. Melvyn Novas in 6 to 8 weeks in office  GERD (gastroesophageal reflux disease) Plan: Likely diagnosis of acid reflux Needs formal evaluation by gastroenterology  Continue Protonix 40 mg in the morning, Pepcid 20 mg at night Follow lifestyle recommendations  Palpitations Plan: Follow-up with cardiology regarding results of your 14-day monitor study Schedule follow-up with cardiology to  review as well as to consider medication changes  Follow Up Instructions:  Return in about 6 weeks (around 03/03/2019), or if symptoms worsen or fail to improve, for Follow up with Dr. Melvyn Novas.   I discussed the assessment and treatment plan with the patient. The patient was provided an opportunity to ask questions and all were answered. The patient agreed with the plan and demonstrated an understanding of the instructions.   The patient was advised to call back or seek an in-person evaluation if the symptoms worsen or if the condition fails to improve as anticipated.  I provided 26 minutes of non-face-to-face time during this encounter.   Lauraine Rinne, NP

## 2019-01-20 NOTE — Telephone Encounter (Signed)
Spoke with pt. She report Dr. Melvyn Novas is wanting to know results of monitor. Informed pt that preliminary reports was just uploaded to her chart yesterday but Nurse will route message to MD and PA to review results. Pt voiced understanding.

## 2019-01-20 NOTE — Assessment & Plan Note (Signed)
Plan: Follow-up with cardiology regarding results of your 14-day monitor study Schedule follow-up with cardiology to review as well as to consider medication changes

## 2019-01-20 NOTE — Assessment & Plan Note (Signed)
Plan: Continue monitor cough Likely contributor to cough management is GERD and acid reflux Patient needs follow-up with gastroenterology Follow-up with Dr. Melvyn Novas in 6 to 8 weeks in office

## 2019-01-20 NOTE — Telephone Encounter (Signed)
° °  Patient went to see Dr. Gustavus Bryant NP  this morning, and Dr. Gustavus Bryant NP told the patient to call our office to see if we received the results of her heart monitor yet. Dr. Melvyn Novas is the patient's Pulmonary doctor, and she sees him to get her breathing issues checked out. Dr. Melvyn Novas wants to make sure that her issues are not Cardiac related.   The patient has not seen her results on MyChart yet either.

## 2019-01-20 NOTE — Assessment & Plan Note (Signed)
Plan: Likely diagnosis of acid reflux Needs formal evaluation by gastroenterology Continue Protonix 40 mg in the morning, Pepcid 20 mg at night Follow lifestyle recommendations

## 2019-01-20 NOTE — Patient Instructions (Addendum)
Call Cardiology to get results of monitor  >>>make appt to discuss   Schedule appointment with gastroenterology when she complete follow-up with cardiology  Please start taking a daily antihistamine:  >>>choose one of: zyrtec, claritin, allegra, or xyzal  >>>these are over the counter medications  >>>can choose generic option  >>>take daily  >>>this medication helps with allergies, post nasal drip, and cough   Continue Protonix 40 mg tablet  >>>Please take 1 tablet daily 15 minutes to 30 minutes before your first meal of the day as well as before your other medications >>>Try to take at the same time each day >>>take this medication daily  Continue Pepcid 20mg  tablet take at night   GERD management: >>>Avoid laying flat until 2 hours after meals >>>Elevate head of the bed including entire chest >>>Reduce size of meals and amount of fat, acid, spices, caffeine and sweets >>>If you are smoking, Please stop! >>>Decrease alcohol consumption >>>Work on maintaining a healthy weight with normal BMI    Return in about 2 months (around 03/23/2019), or if symptoms worsen or fail to improve, for Follow up with Dr. Melvyn Novas.    Coronavirus (COVID-19) Are you at risk?  Are you at risk for the Coronavirus (COVID-19)?  To be considered HIGH RISK for Coronavirus (COVID-19), you have to meet the following criteria:  . Traveled to [place], [place], [place], [place] or [place]; or in the [place] to [place], [place], [place], or [place]; and have fever, cough, and shortness of breath within the last 2 weeks of travel OR . Been in close contact with a person diagnosed with COVID-19 within the last 2 weeks and have fever, cough, and shortness of breath . IF YOU DO NOT MEET THESE CRITERIA, YOU ARE CONSIDERED LOW RISK FOR COVID-19.  What to do if you are HIGH RISK for COVID-19?  Marland Kitchen If you are having a medical emergency, call 911. . Seek medical care right away. Before you go to a  doctor's office, urgent care or emergency department, call ahead and tell them about your recent travel, contact with someone diagnosed with COVID-19, and your symptoms. You should receive instructions from your physician's office regarding next steps of care.  . When you arrive at healthcare provider, tell the healthcare staff immediately you have returned from visiting [place], [place], [place], [place] or [place]; or traveled in the [place] to [place], [place], [place], or [place]; in the last two weeks or you have been in close contact with a person diagnosed with COVID-19 in the last 2 weeks.   . Tell the health care staff about your symptoms: fever, cough and shortness of breath. . After you have been seen by a medical provider, you will be either: o Tested for (COVID-19) and discharged home on quarantine except to seek medical care if symptoms worsen, and asked to  - Stay home and avoid contact with others until you get your results (4-5 days)  - Avoid travel on public transportation if possible (such as bus, train, or airplane) or o Sent to the Emergency Department by EMS for evaluation, COVID-19 testing, and possible admission depending on your condition and test results.  What to do if you are LOW RISK for COVID-19?  Reduce your risk of any infection by using the same precautions used for avoiding the common cold or flu:  Marland Kitchen Wash your hands often with soap and warm water for at least 20 seconds.  If soap and water  are not readily available, use an alcohol-based hand sanitizer with at least 60% alcohol.  . If coughing or sneezing, cover your mouth and nose by coughing or sneezing into the elbow areas of your shirt or coat, into a tissue or into your sleeve (not your hands). . Avoid shaking hands with others and consider head nods or verbal greetings only. . Avoid touching your eyes, nose, or mouth with unwashed hands.  . Avoid close contact with people who are sick. . Avoid  places or events with large numbers of people in one location, like concerts or sporting events. . Carefully consider travel plans you have or are making. . If you are planning any travel outside or inside the [place], visit the CDC's Travelers' Health webpage for the latest health notices. . If you have some symptoms but not all symptoms, continue to monitor at home and seek medical attention if your symptoms worsen. . If you are having a medical emergency, call 911.   [place] / e-Visit: [email or web address]         [facility]: [facility]: [phone]                   [facility]: [phone]           It is flu season:   >>> Best ways to protect herself from the flu: Receive the yearly flu vaccine, practice good hand hygiene washing with soap and also using hand sanitizer when available, eat a nutritious meals, get adequate rest, hydrate appropriately   Please contact the office if your symptoms worsen or you have concerns that you are not improving.   Thank you for choosing [facility] for your healthcare, and for allowing [place] to partner with you on your healthcare journey. I am thankful to be able to provide care to you today.   Wyn Quaker FNP-C      Gastroesophageal Reflux Disease, Adult Gastroesophageal reflux (GER) happens when acid from the stomach flows up into the tube that connects the mouth and the stomach (esophagus). Normally, food travels down the esophagus and stays in the stomach to be digested. With GER, food and stomach acid sometimes move back up into the esophagus. You may have a disease called gastroesophageal reflux disease (GERD) if the reflux:  Happens often.  Causes frequent or very bad symptoms.  Causes problems such as damage to the esophagus. When this happens, the esophagus  becomes sore and swollen (inflamed). Over time, GERD can make small holes (ulcers) in the lining of the esophagus. What are the causes? This condition is caused by a problem with the muscle between the esophagus and the stomach. When this muscle is weak or not normal, it does not close properly to keep food and acid from coming back up from the stomach. The muscle can be weak because of:  Tobacco use.  Pregnancy.  Having a certain type of hernia (hiatal hernia).  Alcohol use.  Certain foods and drinks, such as coffee, chocolate, onions, and peppermint. What increases the risk? You are more likely to develop this condition if you:  Are overweight.  Have a disease that affects your connective tissue.  Use NSAID medicines. What are the signs or symptoms? Symptoms of this condition include:  Heartburn.  Difficult or painful swallowing.  The feeling of having a lump in the throat.  A bitter taste in the mouth.  Bad  breath.  Having a lot of saliva.  Having an upset or bloated stomach.  Belching.  Chest pain. Different conditions can cause chest pain. Make sure you see your doctor if you have chest pain.  Shortness of breath or noisy breathing (wheezing).  Ongoing (chronic) cough or a cough at night.  Wearing away of the surface of teeth (tooth enamel).  Weight loss. How is this treated? Treatment will depend on how bad your symptoms are. Your doctor may suggest:  Changes to your diet.  Medicine.  Surgery. Follow these instructions at home: Eating and drinking   Follow a diet as told by your doctor. You may need to avoid foods and drinks such as: ? Coffee and tea (with or without caffeine). ? Drinks that contain alcohol. ? Energy drinks and sports drinks. ? Bubbly (carbonated) drinks or sodas. ? Chocolate and cocoa. ? Peppermint and mint flavorings. ? Garlic and onions. ? Horseradish. ? Spicy and acidic foods. These include peppers, chili powder, curry  powder, vinegar, hot sauces, and BBQ sauce. ? Citrus fruit juices and citrus fruits, such as oranges, lemons, and limes. ? Tomato-based foods. These include red sauce, chili, salsa, and pizza with red sauce. ? Fried and fatty foods. These include donuts, french fries, potato chips, and high-fat dressings. ? High-fat meats. These include hot dogs, rib eye steak, sausage, ham, and bacon. ? High-fat dairy items, such as whole milk, butter, and cream cheese.  Eat small meals often. Avoid eating large meals.  Avoid drinking large amounts of liquid with your meals.  Avoid eating meals during the 2-3 hours before bedtime.  Avoid lying down right after you eat.  Do not exercise right after you eat. Lifestyle   Do not use any products that contain nicotine or tobacco. These include cigarettes, e-cigarettes, and chewing tobacco. If you need help quitting, ask your doctor.  Try to lower your stress. If you need help doing this, ask your doctor.  If you are overweight, lose an amount of weight that is healthy for you. Ask your doctor about a safe weight loss goal. General instructions  Pay attention to any changes in your symptoms.  Take over-the-counter and prescription medicines only as told by your doctor. Do not take aspirin, ibuprofen, or other NSAIDs unless your doctor says it is okay.  Wear loose clothes. Do not wear anything tight around your waist.  Raise (elevate) the head of your bed about 6 inches (15 cm).  Avoid bending over if this makes your symptoms worse.  Keep all follow-up visits as told by your doctor. This is important. Contact a doctor if:  You have new symptoms.  You lose weight and you do not know why.  You have trouble swallowing or it hurts to swallow.  You have wheezing or a cough that keeps happening.  Your symptoms do not get better with treatment.  You have a hoarse voice. Get help right away if:  You have pain in your arms, neck, jaw, teeth, or  back.  You feel sweaty, dizzy, or light-headed.  You have chest pain or shortness of breath.  You throw up (vomit) and your throw-up looks like blood or coffee grounds.  You pass out (faint).  Your poop (stool) is bloody or black.  You cannot swallow, drink, or eat. Summary  If a person has gastroesophageal reflux disease (GERD), food and stomach acid move back up into the esophagus and cause symptoms or problems such as damage to the esophagus.  Treatment  will depend on how bad your symptoms are.  Follow a diet as told by your doctor.  Take all medicines only as told by your doctor. This information is not intended to replace advice given to you by your health care provider. Make sure you discuss any questions you have with your health care provider. Document Released: 12/02/2007 Document Revised: 12/22/2017 Document Reviewed: 12/22/2017 Elsevier Patient Education  2020 [place] for Gastroesophageal Reflux Disease, Adult When you have gastroesophageal reflux disease (GERD), the foods you eat and your eating habits are very important. Choosing the right foods can help ease your discomfort. Think about working with a nutrition specialist (dietitian) to help you make good choices. What are tips for following this plan?  Meals  Choose healthy foods that are low in fat, such as fruits, vegetables, whole grains, low-fat dairy products, and lean meat, fish, and poultry.  Eat small meals often instead of 3 large meals a day. Eat your meals slowly, and in a place where you are relaxed. Avoid bending over or lying down until 2-3 hours after eating.  Avoid eating meals 2-3 hours before bed.  Avoid drinking a lot of liquid with meals.  Cook foods using methods other than frying. Bake, grill, or broil food instead.  Avoid or limit: ? Chocolate. ? Peppermint or spearmint. ? Alcohol. ? Pepper. ? Black and decaffeinated coffee. ? Black and decaffeinated  tea. ? Bubbly (carbonated) soft drinks. ? Caffeinated energy drinks and soft drinks.  Limit high-fat foods such as: ? Fatty meat or fried foods. ? Whole milk, cream, butter, or ice cream. ? Nuts and nut butters. ? Pastries, donuts, and sweets made with butter or shortening.  Avoid foods that cause symptoms. These foods may be different for everyone. Common foods that cause symptoms include: ? Tomatoes. ? Oranges, lemons, and limes. ? Peppers. ? Spicy food. ? Onions and garlic. ? Vinegar. Lifestyle  Maintain a healthy weight. Ask your doctor what weight is healthy for you. If you need to lose weight, work with your doctor to do so safely.  Exercise for at least 30 minutes for 5 or more days each week, or as told by your doctor.  Wear loose-fitting clothes.  Do not smoke. If you need help quitting, ask your doctor.  Sleep with the head of your bed higher than your feet. Use a wedge under the mattress or blocks under the bed frame to raise the head of the bed. Summary  When you have gastroesophageal reflux disease (GERD), food and lifestyle choices are very important in easing your symptoms.  Eat small meals often instead of 3 large meals a day. Eat your meals slowly, and in a place where you are relaxed.  Limit high-fat foods such as fatty meat or fried foods.  Avoid bending over or lying down until 2-3 hours after eating.  Avoid peppermint and spearmint, caffeine, alcohol, and chocolate. This information is not intended to replace advice given to you by your health care provider. Make sure you discuss any questions you have with your health care provider. Document Released: 12/15/2011 Document Revised: 10/06/2018 Document Reviewed: 07/21/2016 Elsevier Patient Education  2020 [employer].

## 2019-01-27 ENCOUNTER — Telehealth: Payer: Self-pay | Admitting: Cardiovascular Disease

## 2019-01-27 NOTE — Telephone Encounter (Signed)
Follow-up:   Patient called the office asking if we have received her monitor results yet.

## 2019-01-27 NOTE — Telephone Encounter (Signed)
° °  Pt c/o Shortness Of Breath: STAT if SOB developed within the last 24 hours or pt is noticeably SOB on the phone  1. Are you currently SOB (can you hear that pt is SOB on the phone)? no  2. How long have you been experiencing SOB? "quite a while, but it comes and goes''  3. Are you SOB when sitting or when up moving around? Up and moving around.  4. Are you currently experiencing any other symptoms? Headache, fatigue, short temper   Patient called and states she had some SOB when at the store wearing a mask.She had to hurry and finish her errand to be able to take her mask off.  She has not been out of the house, except for doctor's appointments. She just feels like there is something stuck in her chest that isn't supposed to be there. She does state that she has some anziety in general, and it has gotten worse since the pandemic started.

## 2019-01-27 NOTE — Telephone Encounter (Signed)
Returned call to patient she stated she has had 2 episodes of chest pain within the past 2 weeks.Stated she had a episode this morning with sob lasted over 1 hour.Stated no chest pain at present.Stated she is waiting for monitor results.Appointment scheduled with Fabian Sharp PA 8/4 at 11:30 am.Advised if she has any more chest pain to go to ED.

## 2019-01-28 ENCOUNTER — Other Ambulatory Visit: Payer: Self-pay | Admitting: Internal Medicine

## 2019-01-30 NOTE — Progress Notes (Signed)
Chart and office note reviewed in detail  > agree with a/p as outlined    

## 2019-01-30 NOTE — Progress Notes (Signed)
Cardiology Office Note:    Date:  01/31/2019   ID:  TIANNAH GREENLY, DOB 08/13/1945, MRN [id number]  PCP:  Shirline Frees, MD  Cardiologist:  Shelva Majestic, MD   Referring MD: Shirline Frees, MD   Chief Complaint  Patient presents with   Follow-up    palpitations, frequent PVCs    History of Present Illness:    Laura [name] is a 73 y.o. female with a hx of HTN, HLD, anxiety/depression, OSA not on CPA  P, takotsubo cardiomyopathy 11/2009 cath with normal coronaries (EF at that time was 30-35%), nonischemic cardiomyopathy, recent echo 12/09/18 with EF of 27%, diastolic dysfunction, septal-lateral dyssynchrony/LBBB. She has followed with Dr. Sallyanne Kuster and then Dr. Wynonia Lawman in the past. She recently reestablished care with Dr. Claiborne Billings on 09/21/18.  HTN managed with hydralazine 50 mg BID, irbesartan 300 mg daily, lopressor 25 mg, and norvasc 5 mg was added during her appt with Dr. Claiborne Billings. Repeat sleep study is pending (pandemic). I saw her in clinic on 12/19/18 and she had multiple complaints, including fatigue, leg-pain, DOE. She has had a decline in her activity level, but also possibly some symptoms of low mood. She also complained of CP after eating in the epigastric region. She has high anxiety. Echocardiogram in 12/09/18 with EF of 45% and grade 1 DD and LBBB.  With these complaints, I ordered a 14-day zio patch which showed 9.5% PVC burden with occasional couplet and rare triplets; rare episodes of ventricular bigeminy and trigeminy, 2 SVT episodes and 1 run of irregular ventricular tachycardia. I advised her to increase her lopressor.   She presents today for follow up. She is tearful and has multiple complaints. She is currently undergoing workup for nodule on her lung - seeing Dr. Melvyn Novas. Apparently some of her imaging was lost and she has been stressed about this. She is also seeing GI for epigastric pain. GI was waiting until heart monitor is completed. She is also stressed about her daughter's  medical problems, which includes significant anxiety and was just placed on an antidepressant. Laura [name] describes depressive and anxiety symptoms, as well as possible OCD with high irritability. She is tearful today about family problems involving her granddaughter, who she helped raise. The majority of the visit was spent discussing her anxiety.  She continues to report palpitations. It is difficult to determine cardiac symptoms given her other social problems. She did increased her toprol to 25 mg BID but has not noticed improvement in her palpitations. She denies SOB. She wants to start taking a supplement. She also wants to take potassium. She is on spironolactone. I reviewed her labs in June and her K was 4.1. I advised against extra potassium for now.   I tried to discuss her palpitations, her EF, and her medications. I discussed my conversation with Dr. Claiborne Billings on the previous day, which included possible EP referral. EKG today with 4 PVCs. I strongly urged her to see EP, but she repeatedly declined. We discussed possible outcomes. She only wants something for anxiety and OCD. She is already under the care of a psychiatrist.      Past Medical History:  Diagnosis Date   Anemia    Anxiety    Asthma    related to sesonal allergies   Chronic combined systolic and diastolic CHF, NYHA class 2 ([place]) CARDIOLOGIST-  DR POEUMPNT   Depression    History of kidney stones    History of non-ST elevation myocardial infarction (NSTEMI) JUNE 2011  SECONDARY TO TAKOTSUDO SYNDROME (CARDIAC CATH NORMAL)   Hyperlipemia    Hypertension    Hypoglycemia    Hypothyroidism    LBBB (left bundle branch block)    Left ventricular ejection fraction less than 40%    38% PER CARDIOLOGIST NOTE (DR CROITORU)   Memory loss    Mood swings    Nonischemic dilated cardiomyopathy ([place])    MODERATELY DEPRESSED LVF;EF 35-45% by Echo 05/27/11   OSA (obstructive sleep apnea) MODERATE PER STUDY 2005   CPAP  NONCOMPLIANT   Seasonal allergies    SUI (stress urinary incontinence, female)     Past Surgical History:  Procedure Laterality Date   ABDOMINAL HYSTERECTOMY  1985   partial   CARDIAC CATHETERIZATION  09-04-1999;  08/25/2004;   12/09/2009  DR Sallyanne Kuster   NORMAL CORONARIES/  APICAL BALLOONING OF LV CONSISTENT WITH TAKOTSUBO SYMPTOMS/ EF 30-35%   CATARACT EXTRACTION W/ INTRAOCULAR LENS  IMPLANT, BILATERAL     CHOLECYSTECTOMY N/A 09/29/2012   Procedure: LAPAROSCOPIC CHOLECYSTECTOMY WITH INTRAOPERATIVE CHOLANGIOGRAM;  Surgeon: Adin Hector, MD;  Location: [facility];  Service: General;  Laterality: N/A;   CYSTOSCOPY N/A 09/19/2012   Procedure: Erlene Quan;  Surgeon: Bernestine Amass, MD;  Location: [facility];  Service: Urology;  Laterality: N/A;   KNEE ARTHROSCOPY W/ MENISCECTOMY  07-27-2011   MEDIAL AND LATERAL   NASAL SEPTUM SURGERY  1980's   PUBOVAGINAL SLING N/A 09/19/2012   Procedure: SUBURETHRAL Janyth Pupa;  Surgeon: Bernestine Amass, MD;  Location: [facility];  Service: Urology;  Laterality: N/A;   RIGHT URETEROSCOPIC STONE EXTRACTION  08-31-2000   TRANSTHORACIC ECHOCARDIOGRAM  05-27-2011  DR CROITORU   MODERATELY DEPRESSED LVF DUE TO GLOBAL HYPOKINESIS AND MARKED SYSTOLIC ASYNCHRONY/ EF 09%/ MILD LEFT ATRIAL DILATATION    Current Medications: Current Meds  Medication Sig   atorvastatin (LIPITOR) 80 MG tablet TAKE 1/2 TABLET BY MOUTH DAILY FOR CHOLESTEROL   diazepam (VALIUM) 5 MG tablet Take 2 tablets (10 mg total) by mouth every 12 (twelve) hours as needed for anxiety.   famotidine (PEPCID) 20 MG tablet One after supper   hydrALAZINE (APRESOLINE) 50 MG tablet Take 50 mg by mouth 2 (two) times daily.   irbesartan (AVAPRO) 300 MG tablet Take 300 mg by mouth daily.   levothyroxine (SYNTHROID, LEVOTHROID) 88 MCG tablet Take 88 mcg by mouth daily before breakfast.   metoprolol succinate (TOPROL-XL) 25 MG 24 hr tablet Take 1 tablet (25  mg total) by mouth 2 (two) times daily. Take with or immediately following a meal.   mometasone-formoterol (DULERA) 100-5 MCG/ACT AERO Inhale 2 puffs into the lungs 2 (two) times daily.   NUVIGIL 250 MG tablet Take 250 mg by mouth as needed.   pantoprazole (PROTONIX) 40 MG tablet TAKE ONE TABLET BY MOUTH DAILY.   TAKE 30-60 MINUTES BEFORE FIRST MEAL OF THE DAY   spironolactone (ALDACTONE) 25 MG tablet Take 0.5 tablets (12.5 mg total) by mouth daily.   [DISCONTINUED] metoprolol succinate (TOPROL-XL) 50 MG 24 hr tablet Take 25 mg by mouth daily. Take with or immediately following a meal.     Allergies:   Codeine, Ace inhibitors, Citalopram, Fetzima [levomilnacipran], Lasix [furosemide], Nsaids, and Xanax xr Staci Acosta er]   Social History   Socioeconomic History   Marital status: Married    Spouse name: Richard   Number of children: 2   Years of education: 12+   Highest education level: Some college, no degree  Occupational History   Occupation:  Retired  Scientist, product/process development strain: Not on [employer] insecurity    Worry: Not on file    Inability: Not on Lexicographer needs    Medical: Not on file    Non-medical: Not on file  Tobacco Use   Smoking status: Never Smoker   Smokeless tobacco: Never Used  Substance and Sexual Activity   Alcohol use: No    Alcohol/week: 0.0 standard drinks   Drug use: No   Sexual activity: Never    Birth control/protection: Post-menopausal  Lifestyle   Physical activity    Days per week: Not on file    Minutes per session: Not on file   Stress: Not on file  Relationships   Social connections    Talks on phone: Not on file    Gets together: Not on file    Attends religious service: Not on file    Active member of club or organization: Not on file    Attends meetings of clubs or organizations: Not on file    Relationship status: Not on file  Other Topics Concern   Not on file  Social History  Narrative   Lives at home with husband.   Right-handed.   Drinks 2-3 cups caffeine per day.   No regular exercise.     Family History: The patient's family history includes Fibromyalgia in her brother; Heart attack in her father and paternal grandfather; Heart disease in her brother; Hypertension in her brother and mother; Pneumonia in her mother; Pulmonary embolism in her brother.  ROS:   Please see the history of present illness.     All other systems reviewed and are negative.  EKGs/Labs/Other Studies Reviewed:    The following studies were reviewed today:  Echo 12/09/18: 1. The left ventricle has a visually estimated ejection fraction of 45%. The cavity size was normal. There is moderately increased left ventricular wall thickness. Left ventricular diastolic Doppler parameters are consistent with impaired relaxation.  Left ventrical global hypokinesis without regional wall motion abnormalities. Septal-lateral dyssynchrony consistent with LBBB.  2. The right ventricle has normal systolic function. The cavity was normal. There is no increase in right ventricular wall thickness.  3. Left atrial size was mildly dilated.  4. No evidence of mitral valve stenosis. Trivial mitral regurgitation.  5. The aortic valve is tricuspid. Mild calcification of the aortic valve. No stenosis of the aortic valve.  6. The aortic root is normal in size and structure.  7. The IVC was normal in size. No complete TR doppler jet so unable to estimate PA systolic pressure.   Heart monitor 01/19/19: The patient was monitored from June 28 through January 08, 2019 for 13 days and 12 hours.  The predominant rhythm was sinus rhythm with an average heart rate of 71 bpm.  There was bundle branch block/I VCD.  There were frequent isolated PVCs at 9.5%, occasional couplet at 1.5% and rare triplets less than 1%.   There were rare episodes of ventricular bigeminy and trigeminy.  There was 1 run of irregular ventricular  tachycardia lasting 4 beats with an average rate of 128 with a maximum rate at 174.  There were 2 supraventricular tachycardic episodes with the fastest interval 14 beats (average rate 120  EKG:  EKG is ordered today.  The ekg ordered today demonstrates sinus rhythm, frequent PVCs, LBBB (old), HR 68  Recent Labs: 09/19/2018: Hemoglobin 13.0; Platelets 262.0; Pro B Natriuretic peptide (BNP) 65.0; TSH 1.02  12/19/2018: BUN 12; Creatinine, Ser 0.83; Potassium 4.1; Sodium 143  Recent Lipid Panel    Component Value Date/Time   CHOL 158 12/19/2018 1358   TRIG 113 12/19/2018 1358   HDL 57 12/19/2018 1358   CHOLHDL 2.8 12/19/2018 1358   CHOLHDL 5.1 08/23/2014 1623   VLDL 75 (H) 08/23/2014 1623   LDLCALC 78 12/19/2018 1358    Physical Exam:    VS:  BP 139/80    Pulse 68    Temp (!) 97.2 F (36.2 C)    Ht 5\' 3"  (1.6 m)    Wt 181 lb (82.1 kg)    BMI 32.06 kg/m     Wt Readings from Last 3 Encounters:  01/31/19 181 lb (82.1 kg)  12/19/18 182 lb 12.8 oz (82.9 kg)  09/21/18 181 lb 3.2 oz (82.2 kg)     GEN: Well nourished, well developed in no acute distress HEENT: Normal NECK: No JVD; No carotid bruits LYMPHATICS: No lymphadenopathy CARDIAC:  Irregular rhythm, regular rate, no murmurs, rubs, gallops RESPIRATORY:  Clear to auscultation without rales, wheezing or rhonchi  ABDOMEN: Soft, non-tender, non-distended MUSCULOSKELETAL:  No edema; No deformity  SKIN: Warm and dry NEUROLOGIC:  Alert and oriented x 3 PSYCHIATRIC:  Normal affect   ASSESSMENT:    1. Frequent PVCs   2. Essential hypertension   3. Chronic combined systolic and diastolic heart failure ([place])   4. OSA (obstructive sleep apnea)   5. Mixed hyperlipidemia   6. Takotsubo syndrome, June 2011.(normal coronaries)    PLAN:    In order of problems listed above:  Frequent PVCs Increasing toprol to 25 mg BID has not helped. She does not want to further increase. We had a long discussion about seeing EP, including her last  echo results with mildly reduced EF. She refuses to see EP. I am not sure how else to help her at this point. She only wants to focus on treating her anxiety, which I think may help but will not completely resolve her ongoing cardiac problems.    Nonischemic cardiomyopathy Hx of Takotsubo EF 48%, diastolic dysfuction May be related to her frequent PVCs. I am concerned her PVC burden could worsen heart failure. We discussed this. She is unwilling to see EP or consider other treatments.    Hyperlipidemia 12/19/2018: Cholesterol, Total 158; HDL 57; LDL Calculated 78; Triglycerides 113 80 mg lipitor   OSA Would benefit from treating OSA. She is awaiting a sleep study. I will send to [facility] to see if we can expedite this.    Hypertension Pressure is reasonably controlled.  Continue spironolactone. Reviewed labs from June. No changes. Advised against taking a potassium supplement.    Follow up with Dr. Claiborne Billings in 1 month.   Medication Adjustments/Labs and Tests Ordered: Current medicines are reviewed at length with the patient today.  Concerns regarding medicines are outlined above.  Orders Placed This Encounter  Procedures   EKG 12-Lead   Meds ordered this encounter  Medications   metoprolol succinate (TOPROL-XL) 25 MG 24 hr tablet    Sig: Take 1 tablet (25 mg total) by mouth 2 (two) times daily. Take with or immediately following a meal.    Dispense:  30 tablet    Refill:  6    Signed, Ledora Bottcher, [place]  01/31/2019 1:18 PM    [facility]

## 2019-01-30 NOTE — Telephone Encounter (Signed)
Monitor results are in epic

## 2019-01-31 ENCOUNTER — Ambulatory Visit (INDEPENDENT_AMBULATORY_CARE_PROVIDER_SITE_OTHER): Payer: Medicare Other | Admitting: Physician Assistant

## 2019-01-31 ENCOUNTER — Other Ambulatory Visit: Payer: Self-pay

## 2019-01-31 ENCOUNTER — Encounter: Payer: Self-pay | Admitting: Physician Assistant

## 2019-01-31 VITALS — BP 139/80 | HR 68 | Temp 97.2°F | Ht 63.0 in | Wt 181.0 lb

## 2019-01-31 DIAGNOSIS — I5181 Takotsubo syndrome: Secondary | ICD-10-CM | POA: Diagnosis not present

## 2019-01-31 DIAGNOSIS — E782 Mixed hyperlipidemia: Secondary | ICD-10-CM | POA: Diagnosis not present

## 2019-01-31 DIAGNOSIS — I5042 Chronic combined systolic (congestive) and diastolic (congestive) heart failure: Secondary | ICD-10-CM | POA: Diagnosis not present

## 2019-01-31 DIAGNOSIS — I493 Ventricular premature depolarization: Secondary | ICD-10-CM

## 2019-01-31 DIAGNOSIS — G4733 Obstructive sleep apnea (adult) (pediatric): Secondary | ICD-10-CM

## 2019-01-31 DIAGNOSIS — I10 Essential (primary) hypertension: Secondary | ICD-10-CM

## 2019-01-31 MED ORDER — METOPROLOL SUCCINATE ER 25 MG PO TB24: 25.0000 mg | ORAL_TABLET | Freq: Two times a day (BID) | ORAL | 6 refills | Status: DC

## 2019-01-31 NOTE — Patient Instructions (Addendum)
Medication Instructions:  Take Metoprolol 25 mg by mouth two times daily.  If you need a refill on your cardiac medications before your next appointment, please call your pharmacy.    Follow-Up: At [facility], you and your health needs are our priority.  As part of our continuing mission to provide you with exceptional heart care, we have created designated Provider Care Teams.  These Care Teams include your primary Cardiologist (physician) and Advanced Practice Providers (APPs -  Physician Assistants and Nurse Practitioners) who all work together to provide you with the care you need, when you need it. . You have been scheduled for a 1 month follow-up office visit with Dr. Claiborne Billings on Wednesday, 03/14/19 at 3:20 PM.

## 2019-01-31 NOTE — Telephone Encounter (Signed)
Noted. Thanks.

## 2019-02-01 ENCOUNTER — Other Ambulatory Visit: Payer: Self-pay | Admitting: Physician Assistant

## 2019-02-01 DIAGNOSIS — I498 Other specified cardiac arrhythmias: Secondary | ICD-10-CM

## 2019-02-01 DIAGNOSIS — G4733 Obstructive sleep apnea (adult) (pediatric): Secondary | ICD-10-CM

## 2019-02-01 DIAGNOSIS — I10 Essential (primary) hypertension: Secondary | ICD-10-CM

## 2019-02-10 ENCOUNTER — Other Ambulatory Visit (HOSPITAL_COMMUNITY): Payer: Medicare Other

## 2019-02-14 ENCOUNTER — Encounter (HOSPITAL_BASED_OUTPATIENT_CLINIC_OR_DEPARTMENT_OTHER): Payer: Medicare Other | Admitting: Cardiovascular Disease

## 2019-03-13 DIAGNOSIS — F411 Generalized anxiety disorder: Secondary | ICD-10-CM | POA: Diagnosis not present

## 2019-03-13 DIAGNOSIS — F4310 Post-traumatic stress disorder, unspecified: Secondary | ICD-10-CM | POA: Diagnosis not present

## 2019-03-13 DIAGNOSIS — F332 Major depressive disorder, recurrent severe without psychotic features: Secondary | ICD-10-CM | POA: Diagnosis not present

## 2019-03-15 ENCOUNTER — Ambulatory Visit (INDEPENDENT_AMBULATORY_CARE_PROVIDER_SITE_OTHER): Payer: Medicare Other | Admitting: Cardiovascular Disease

## 2019-03-15 ENCOUNTER — Other Ambulatory Visit: Payer: Self-pay

## 2019-03-15 ENCOUNTER — Encounter: Payer: Self-pay | Admitting: Cardiovascular Disease

## 2019-03-15 VITALS — BP 125/67 | HR 71 | Temp 97.0°F | Ht 63.5 in | Wt 181.8 lb

## 2019-03-15 DIAGNOSIS — I5181 Takotsubo syndrome: Secondary | ICD-10-CM

## 2019-03-15 DIAGNOSIS — I10 Essential (primary) hypertension: Secondary | ICD-10-CM

## 2019-03-15 DIAGNOSIS — G4730 Sleep apnea, unspecified: Secondary | ICD-10-CM | POA: Diagnosis not present

## 2019-03-15 DIAGNOSIS — I428 Other cardiomyopathies: Secondary | ICD-10-CM

## 2019-03-15 DIAGNOSIS — I447 Left bundle-branch block, unspecified: Secondary | ICD-10-CM

## 2019-03-15 DIAGNOSIS — I493 Ventricular premature depolarization: Secondary | ICD-10-CM

## 2019-03-15 DIAGNOSIS — E785 Hyperlipidemia, unspecified: Secondary | ICD-10-CM

## 2019-03-15 MED ORDER — METOPROLOL SUCCINATE ER 25 MG PO TB24: ORAL_TABLET | ORAL | 6 refills | Status: DC

## 2019-03-15 NOTE — Patient Instructions (Signed)
Medication Instructions:  INCREASE METOPROLOL 37.5MG  (1-1/2 TAB) IN THE AM AND 50MG  IN THE PM  If you need a refill on your cardiac medications before your next appointment, please call your pharmacy.  Special Instructions: SOMEONE WILL CALL YOU TO DISCUSS SCHEDULING  Follow-Up: You will need a follow up appointment in 6 months.  Please call our office 2 months in advance, December 2021 to schedule this, MARCH 2021 appointment.  You may see Shelva Majestic, MD or one of the following Advanced Practice Providers on your designated Care Team:  Almyra Deforest, PA-C  Fabian Sharp, PA-C     At [facility], you and your health needs are our priority.  As part of our continuing mission to provide you with exceptional heart care, we have created designated Provider Care Teams.  These Care Teams include your primary Cardiologist (physician) and Advanced Practice Providers (APPs -  Physician Assistants and Nurse Practitioners) who all work together to provide you with the care you need, when you need it.  Thank you for choosing [facility] at [facility]!!

## 2019-03-15 NOTE — Progress Notes (Signed)
Cardiology Office Note    Date:  03/17/2019   ID:  Laura [name], DOB 02-04-1946, MRN [id number]  PCP:  Shirline Frees, MD  Cardiologist:  Shelva Majestic, MD   New cardiology evaluation; former patient of Dr. Tollie Eth.  History of Present Illness:  Laura [name] is a 73 y.o. female who is the wife of my patient Mr. Thomasenia Dowse.  I saw her for initial cardiology evaluation on September 21, 2018.  She presents for follow-up evaluation.    Laura [name] is a remote patient of Dr. Sallyanne Kuster.  Apparently, she has a history of left bundle branch block, and apparently was felt to have a Takotsubo cardiomyopathy after she presented with a non-ST segment delegation MI with a peak troponin of 5.33.  She underwent cardiac catheterization by Dr. Sallyanne Kuster on December 09, 2009 and was found to have normal coronary arteries and classic findings of Takotsubo cardiomyopathy with apical ballooning of the left ventricle.  Her EF by echo was 30 to 35%.  At that time she was started on lisinopril, Lasix and low-dose metoprolol.  She also has a history of asthma and bronchitis.  Over the past several years she has been followed by Dr. Tollie Eth for cardiology care.  She has a history of hypertension and has been on irbesartan 300 mg, hydralazine 50 mg twice a day, metoprolol succinate 12.5 mg daily.  She has a history of hypothyroidism and has been on levothyroxine 88 mcg.  There also was a history of some anxiety for which she had been given diazepam as well as citalopram.  I initially saw her in March 2020. Upon further questioning, the patient has a history of severe sleep apnea and states that she is had 3 sleep studies with her last being many years ago by Dr. Annamaria Boots.  She reports an AHI initially of 66.  She had been on CPAP therapy remotely remembers a pressure of 14 cm but then became not tolerant to CPAP mask.  As result she later obtained an oral appliance by Dr. Oneal Grout.  However she has had  difficulty with some teeth fractures and and has not used her oral appliance in well over a year.  She admits to snoring.  Her sleep is nonrestorative.  She was evaluated by Dr. Christinia Gully  week for cough bronchitis/asthma.  She was started on [facility] for asthma.  She was also recommended to take both pantoprazole and famotidine for GERD and its effect on her cough.    When I initially saw her, I spent considerable time with her discussing her untreated sleep apnea and the significant improvements in mask and CPAP technology since her initial trial.  I recommended she undergo a follow-up echo Doppler study.  She was on atorvastatin for hyperlipidemia.  Due to the COVID pandemic, she never underwent a sleep study and upon further questioning today does not want to have one as long as she would have to have a COVID test.  She underwent an echo Doppler study on December 09, 2018 which revealed an EF of 45% with septal lateral dyssynchrony consistent with left bundle branch block.  There was mild aortic sclerosis without stenosis.  She was seen by Doreene Adas on January 31, 2019 and at that time was having palpitations.  She admitted to being under significant recent stress.  There was some discussion concerning EP valuation of her PVCs but she declined.  She presents today for evaluation.   Past Medical  History:  Diagnosis Date  . Anemia   . Anxiety   . Asthma    related to sesonal allergies  . Chronic combined systolic and diastolic CHF, NYHA class 2 ([place]) CARDIOLOGIST-  DR [id number]  . Depression   . History of kidney stones   . History of non-ST elevation myocardial infarction (NSTEMI) JUNE 2011   SECONDARY TO TAKOTSUDO SYNDROME (CARDIAC CATH NORMAL)  . Hyperlipemia   . Hypertension   . Hypoglycemia   . Hypothyroidism   . LBBB (left bundle branch block)   . Left ventricular ejection fraction less than 40%    38% PER CARDIOLOGIST NOTE (DR CROITORU)  . Memory loss   . Mood swings   . Nonischemic  dilated cardiomyopathy ([place])    MODERATELY DEPRESSED LVF;EF 35-45% by Echo 05/27/11  . OSA (obstructive sleep apnea) MODERATE PER STUDY 2005   CPAP NONCOMPLIANT  . Seasonal allergies   . SUI (stress urinary incontinence, female)     Past Surgical History:  Procedure Laterality Date  . ABDOMINAL HYSTERECTOMY  1985   partial  . CARDIAC CATHETERIZATION  09-04-1999;  08/25/2004;   12/09/2009  DR CROITORU   NORMAL CORONARIES/  APICAL BALLOONING OF LV CONSISTENT WITH TAKOTSUBO SYMPTOMS/ EF 30-35%  . CATARACT EXTRACTION W/ INTRAOCULAR LENS  IMPLANT, BILATERAL    . CHOLECYSTECTOMY N/A 09/29/2012   Procedure: LAPAROSCOPIC CHOLECYSTECTOMY WITH INTRAOPERATIVE CHOLANGIOGRAM;  Surgeon: Adin Hector, MD;  Location: [facility];  Service: General;  Laterality: N/A;  . CYSTOSCOPY N/A 09/19/2012   Procedure: Erlene Quan;  Surgeon: Bernestine Amass, MD;  Location[facility];  Service: Urology;  Laterality: N/A;  . KNEE ARTHROSCOPY W/ MENISCECTOMY  07-27-2011   MEDIAL AND LATERAL  . NASAL SEPTUM SURGERY  1980's  . PUBOVAGINAL SLING N/A 09/19/2012   Procedure: SUBURETHRAL Janyth Pupa;  Surgeon: Bernestine Amass, MD;  Location: [facility];  Service: Urology;  Laterality: N/A;  . RIGHT URETEROSCOPIC STONE EXTRACTION  08-31-2000  . TRANSTHORACIC ECHOCARDIOGRAM  05-27-2011  DR CROITORU   MODERATELY DEPRESSED LVF DUE TO GLOBAL HYPOKINESIS AND MARKED SYSTOLIC ASYNCHRONY/ EF 35%/ MILD LEFT ATRIAL DILATATION    Current Medications: Outpatient Medications Prior to Visit  Medication Sig Dispense Refill  . atorvastatin (LIPITOR) 80 MG tablet TAKE 1/2 TABLET BY MOUTH DAILY FOR CHOLESTEROL 45 tablet 3  . diazepam (VALIUM) 5 MG tablet Take 2 tablets (10 mg total) by mouth every 12 (twelve) hours as needed for anxiety. 20 tablet 0  . famotidine (PEPCID) 20 MG tablet One after supper 30 tablet 11  . hydrALAZINE (APRESOLINE) 50 MG tablet Take 50 mg by mouth daily.     . irbesartan (AVAPRO)  300 MG tablet Take 300 mg by mouth daily.    Marland Kitchen levothyroxine (SYNTHROID, LEVOTHROID) 88 MCG tablet Take 88 mcg by mouth daily before breakfast.    . mometasone-formoterol (DULERA) 100-5 MCG/ACT AERO Inhale 2 puffs into the lungs 2 (two) times daily. 1 Inhaler 0  . NUVIGIL 250 MG tablet Take 250 mg by mouth as needed.  3  . pantoprazole (PROTONIX) 40 MG tablet TAKE ONE TABLET BY MOUTH DAILY.   TAKE 30-60 MINUTES BEFORE FIRST MEAL OF THE DAY 30 tablet 3  . spironolactone (ALDACTONE) 25 MG tablet Take 0.5 tablets (12.5 mg total) by mouth daily. 30 tablet 6  . metoprolol succinate (TOPROL-XL) 25 MG 24 hr tablet Take 1 tablet (25 mg total) by mouth 2 (two) times daily. Take with or immediately following a meal. (  Patient taking differently: Take 25 mg by mouth daily. Take with or immediately following a meal. ) 30 tablet 6   No facility-administered medications prior to visit.      Allergies:   Codeine, Ace inhibitors, Citalopram, Fetzima [levomilnacipran], Lasix [furosemide], Nsaids, and Xanax xr [alprazolam er]   Social History   Socioeconomic History  . Marital status: Married    Spouse name: Richard  . Number of children: 2  . Years of education: 12+  . Highest education level: Some college, no degree  Occupational History  . Occupation: Retired  Scientific laboratory technician  . Financial resource strain: Not on file  . Food insecurity    Worry: Not on file    Inability: Not on file  . Transportation needs    Medical: Not on file    Non-medical: Not on file  Tobacco Use  . Smoking status: Never Smoker  . Smokeless tobacco: Never Used  Substance and Sexual Activity  . Alcohol use: No    Alcohol/week: 0.0 standard drinks  . Drug use: No  . Sexual activity: Never    Birth control/protection: Post-menopausal  Lifestyle  . Physical activity    Days per week: Not on file    Minutes per session: Not on file  . Stress: Not on file  Relationships  . Social Herbalist on phone: Not on  file    Gets together: Not on file    Attends religious service: Not on file    Active member of club or organization: Not on file    Attends meetings of clubs or organizations: Not on file    Relationship status: Not on file  Other Topics Concern  . Not on file  Social History Narrative   Lives at home with husband.   Right-handed.   Drinks 2-3 cups caffeine per day.   No regular exercise.     She is married for 55 years to my patient Mr. Bevin Das.  She has 2 children and 1 grandchild.  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  She completed 12th grade of education.  She does not exercise.  She does not drink alcohol.  Family History:  The patient's family history includes Fibromyalgia in her brother; Heart attack in her father and paternal grandfather; Heart disease in her brother; Hypertension in her brother and mother; Pneumonia in her mother; Pulmonary embolism in her brother.   Her mother died at age 50 secondary to anaphylaxis.  Her father died at age 61 with a heart attack.  A brother died at age 24 with heart problems.  She has a living sister age 46.  ROS General: Negative; No fevers, chills, or night sweats;  HEENT: Negative; No changes in vision or hearing, sinus congestion, difficulty swallowing Pulmonary: Positive for cough, asthma and bronchitis Cardiovascular: No chest pain, positive for palpitations GI: Negative; No nausea, vomiting, diarrhea, or abdominal pain GU: Negative; No dysuria, hematuria, or difficulty voiding Musculoskeletal: Negative; no myalgias, joint pain, or weakness Hematologic/Oncology: Negative; no easy bruising, bleeding Endocrine: Negative; no heat/cold intolerance; no diabetes Neuro: Negative; no changes in balance, headaches Skin: Negative; No rashes or skin lesions Psychiatric: Negative; No behavioral problems, depression Sleep: History of OSA, previously documented to be severe.  Remotely used CPAP  and remotely had a customized oral appliance.  Currently snores, nocturia 2 times per night, nonrestorative sleep, and she experiences daytime sleepiness.  norestless legs, hypnogognic hallucinations, no cataplexy Other comprehensive  14 point system review is negative.   PHYSICAL EXAM:   VS:  BP 125/67   Pulse 71   Temp (!) 97 F (36.1 C)   Ht 5' 3.5" (1.613 m)   Wt 181 lb 12.8 oz (82.5 kg)   SpO2 100%   BMI 31.70 kg/m     Repeat blood pressure by me 148/70  Wt Readings from Last 3 Encounters:  03/15/19 181 lb 12.8 oz (82.5 kg)  01/31/19 181 lb (82.1 kg)  12/19/18 182 lb 12.8 oz (82.9 kg)     General: Alert, oriented, no distress.  Skin: normal turgor, no rashes, warm and dry HEENT: Normocephalic, atraumatic. Pupils equal round and reactive to light; sclera anicteric; extraocular muscles intact;  Nose without nasal septal hypertrophy Mouth/Parynx benign; Mallinpatti scale 3 Neck: No JVD, no carotid bruits; normal carotid upstroke Lungs: clear to ausculatation and percussion; no wheezing or rales Chest wall: without tenderness to palpitation Heart: PMI not displaced, RRR, s1 s2 normal, 1/6 systolic murmur, no diastolic murmur, no rubs, gallops, thrills, or heaves Abdomen: soft, nontender; no hepatosplenomehaly, BS+; abdominal aorta nontender and not dilated by palpation. Back: no CVA tenderness Pulses 2+ Musculoskeletal: full range of motion, normal strength, no joint deformities Extremities: no clubbing cyanosis or edema, Homan's sign negative  Neurologic: grossly nonfocal; Cranial nerves grossly wnl Psychologic: Normal mood and affect   Studies/Labs Reviewed:   EKG:  EKG is ordered today.  ECG (independently read by me): Sinus rhythm at 68 bpm, PVC, left bundle branch block with repolarization changes.  September 21, 2018 ECG (independently read by me): Sinus rhythm at 73 bpm with PACs, left bundle branch block with repolarization changes.  Recent Labs: BMP Latest Ref Rng  & Units 12/19/2018 12/09/2018 09/19/2018  Glucose 65 - 99 mg/dL 89 95 91  BUN 8 - 27 mg/dL '12 15 18  ' Creatinine 0.57 - 1.00 mg/dL 0.83 0.91 0.90  BUN/Creat Ratio 12 - '28 14 16 ' -  Sodium 134 - 144 mmol/L 143 144 141  Potassium 3.5 - 5.2 mmol/L 4.1 4.5 3.6  Chloride 96 - 106 mmol/L 105 106 106  CO2 20 - 29 mmol/L '25 24 24  ' Calcium 8.7 - 10.3 mg/dL 9.4 9.5 9.9     Hepatic Function Latest Ref Rng & Units 08/03/2017 10/25/2015 12/19/2014  Total Protein 6.5 - 8.1 g/dL 7.6 7.0 6.7  Albumin 3.5 - 5.0 g/dL 4.2 4.1 3.7  AST 15 - 41 U/L '26 24 21  ' ALT 14 - 54 U/L '27 24 21  ' Alk Phosphatase 38 - 126 U/L 81 78 69  Total Bilirubin 0.3 - 1.2 mg/dL 0.6 0.3 0.6  Bilirubin, Direct 0.0 - 0.3 mg/dL - - -    CBC Latest Ref Rng & Units 09/19/2018 08/03/2017 08/03/2017  WBC 4.0 - 10.5 K/uL 12.6(H) - 8.0  Hemoglobin 12.0 - 15.0 g/dL 13.0 13.9 13.1  Hematocrit 36.0 - 46.0 % 39.0 41.0 41.0  Platelets 150.0 - 400.0 K/uL 262.0 - 264   Lab Results  Component Value Date   MCV 74.9 (L) 09/19/2018   MCV 78.5 08/03/2017   MCV 79.7 10/25/2015   Lab Results  Component Value Date   TSH 1.02 09/19/2018   Lab Results  Component Value Date   HGBA1C 6.0 (H) 12/19/2018     BNP    Component Value Date/Time   BNP 21.7 05/15/2015 1339    ProBNP    Component Value Date/Time   PROBNP 65.0 09/19/2018 1532     Lipid Panel  Component Value Date/Time   CHOL 158 12/19/2018 1358   TRIG 113 12/19/2018 1358   HDL 57 12/19/2018 1358   CHOLHDL 2.8 12/19/2018 1358   CHOLHDL 5.1 08/23/2014 1623   VLDL 75 (H) 08/23/2014 1623   LDLCALC 78 12/19/2018 1358     RADIOLOGY: No results found.   Additional studies/ records that were reviewed today include:  I have reviewed the records of Dr. Viona Gilmore. Tollie Eth.   ECHO Doppler study was in August 2018 which showed moderate concentric LVH with abnormal septal wall motion due to left bundle branch block.  EF 50%.  Moderate LA dilation.  Mild to moderate MR.  Carotid  duplex imaging from April 01, 2017: 1 to 39% right and left proximal internal carotid stenoses  Lexiscan Myoview studies March 25, 2017: Normal perfusion with EF at 43% with abnormal septal motion consistent with left bundle branch block.  The visual EF appeared higher.  It was felt that a low EF was affected by the bundle branch block and  gating artifact due to PVCs.   ASSESSMENT:    1. Essential hypertension   2. Nonischemic cardiomyopathy ([place])   3. Takotsubo syndrome, June 2011 (normal coronaries)   4. Sleep apnea, unspecified type   5. Frequent PVCs   6. Hyperlipidemia with target LDL less than 70   7. Left bundle branch block     PLAN:  Laura [name] is a 74 year old female who suffered a non-ST segment elevation myocardial infarction at which time she was found to have findings consistent with Takotsubo cardiomyopathy.  EF at that time was 30 to 35%.  Coronary angiography revealed normal coronary arteries.  Her cardiomyopathy was nonobstructive.  She was treated with initial medical therapy including ACE inhibition, low-dose beta-blocker therapy as well as diuretic.  Subsequently, she was followed  by Dr. Tollie Eth and on last echo Doppler assessment EF was approximately 50%.  She has chronic left bundle branch block.  She has a history of hypertension and recently has been managed with hydralazine 50 mg daily, irbesartan 300 mg, Toprol XL 25 mg in addition to spironolactone 12.5 mg daily.  With her palpitations, she was advised to increase her Toprol to 25 mg twice a day which she has not done.  Reviewed her most recent echo Doppler study from June 2020 which now reveals an EF of approximately 45% and findings consistent with her left bundle branch block with septal dyssynergy.  There is grade 1 diastolic dysfunction.  I have recommended further titration of Toprol-XL to at least 37.5 mg once in the morning and she has agreed to do this.  I also again had a lengthy discussion  with her concerning her untreated sleep apnea which may be contributing to some of her palpitations and blood pressure elevation in addition to potential cardiovascular adverse consequences.  Since she does not want to have a COVID test due to the nasopharyngeal swab she is agreed to have a home study and I will try to get this arranged as soon as possible.  Again discussed new improvements in equipment and particularly the masks.  I reviewed laboratory from June 2020.  LDL cholesterol was 78 on her current dose of atorvastatin.  She admits to legs cramping at times with walking but also can occur at night.  We discussed potential future lower extremity Doppler assessment but she prefers not to do this presently.  I will see her back in the office in 6 months or sooner as  needed.  Time spent: 25 minutes  Medication Adjustments/Labs and Tests Ordered: Current medicines are reviewed at length with the patient today.  Concerns regarding medicines are outlined above.  Medication changes, Labs and Tests ordered today are listed in the Patient Instructions below. Patient Instructions  Medication Instructions:  INCREASE METOPROLOL 37.5MG (1-1/2 TAB) IN THE AM AND 50MG IN THE PM  If you need a refill on your cardiac medications before your next appointment, please call your pharmacy.  Special Instructions: SOMEONE WILL CALL YOU TO DISCUSS SCHEDULING  Follow-Up: You will need a follow up appointment in 6 months.  Please call our office 2 months in advance, December 2021 to schedule this, MARCH 2021 appointment.  You may see Shelva Majestic, MD or one of the following Advanced Practice Providers on your designated Care Team:  Almyra Deforest, PA-C  Fabian Sharp, PA-C     At [facility], you and your health needs are our priority.  As part of our continuing mission to provide you with exceptional heart care, we have created designated Provider Care Teams.  These Care Teams include your primary Cardiologist  (physician) and Advanced Practice Providers (APPs -  Physician Assistants and Nurse Practitioners) who all work together to provide you with the care you need, when you need it.  Thank you for choosing [facility] at [facility]!!        Signed, Shelva Majestic, MD  03/17/2019 7:12 PM    [facility] [address], [place], [place], [place]  [address] Phone: [phone]

## 2019-03-17 ENCOUNTER — Encounter: Payer: Self-pay | Admitting: Cardiovascular Disease

## 2019-03-21 ENCOUNTER — Other Ambulatory Visit: Payer: Self-pay

## 2019-03-21 ENCOUNTER — Ambulatory Visit (INDEPENDENT_AMBULATORY_CARE_PROVIDER_SITE_OTHER): Payer: Medicare Other

## 2019-03-21 ENCOUNTER — Ambulatory Visit (INDEPENDENT_AMBULATORY_CARE_PROVIDER_SITE_OTHER): Payer: Medicare Other | Admitting: Internal Medicine

## 2019-03-21 ENCOUNTER — Encounter: Payer: Self-pay | Admitting: Internal Medicine

## 2019-03-21 DIAGNOSIS — J45991 Cough variant asthma: Secondary | ICD-10-CM | POA: Diagnosis not present

## 2019-03-21 DIAGNOSIS — R0609 Other forms of dyspnea: Secondary | ICD-10-CM | POA: Diagnosis not present

## 2019-03-21 DIAGNOSIS — R05 Cough: Secondary | ICD-10-CM | POA: Diagnosis not present

## 2019-03-21 NOTE — Progress Notes (Signed)
Laura [name], female    DOB: 1945/07/24, 73 y.o.   MRN: [id number]   Brief patient profile:  83 yowf never smoker last seen in pulmonary clinic by Dr Annamaria Boots:  10/01/14 noncompliant with cpap and referred to pulmonary clinic 09/19/2018 by Dr Samara Snide re: recurrent cough and sob.   Pt developed allergies/asthma in her 64's (maybe even back further but never saw specialist) had sinus surgery in 1985 and saw Dr Minette Brine shots seemed to helped x maybe 1.5 years much less need for inhalers (some symptoms but never inhalers) then around 2017 worse trouble with sob and cough different from prior years but still only used intermitterently until  December 2019 and never better since with sooner after lying down nightly and forced to sleep in 60 degrees recliner / has not been able to tol cpap as above.  [facility] eval "couldn't read what I was allergic to" rec zyrtec (req records 03/21/2019 )     History of Present Illness  09/19/2018  Pulmonary/ 1st office eval/Laura [name]  Chief Complaint  Patient presents with  . Pulmonary Consult    Self referral. Pt c/o recurrent bronchitis over the past 3 years. She states at least 3 x per year she gets SOB and cough. She c/o persisent cough and SOB since 09/12/2018. Cough is prod with light yellow sputu. Cough is worse when she lies.   Dyspnea: indolent onset gradually worse to point now [facility] = can't walk 100 yards even at a slow pace at a flat grade s stopping due to sob   Cough:  Almost immediate hs / light yellow  Sleep: 60 degrees in recliner SABA use: albuterol no helping  sev rounds of prednisone smidge better transiently "but not like in past when worked well" rec Dulera 100 Take 2 puffs first thing in am and then another 2 puffs about 12 hours later.  Work on inhaler technique:  Only use your albuterol as a rescue medication Pantoprazole (protonix) 40 mg   Take  30-60 min before first meal of the day and Pepcid (famotidine)  20 mg one after supper  until return to office  Ok to use Nyquil as needed  Please remember to go to the lab department   for your tests - we will call you with the results when they are available. Please schedule a follow up office visit in 2 weeks, sooner if needed  - bring all inhalers with you to each office visit.   NP recs 01/20/19  Please start taking a daily antihistamine:  >>>choose one of: zyrtec, claritin, allegra, or xyzal  >>>these are over the counter medications  >>>can choose generic option  >>>take daily  >>>this medication helps with allergies, post nasal drip, and cough   Continue Protonix 40 mg tablet  >>>Please take 1 tablet daily 15 minutes to 30 minutes before your first meal of the day as well as before your other medications >>>Try to take at the same time each day >>>take this medication daily  Continue Pepcid 20mg  tablet take at night   GERD management: >>>Avoid laying flat until 2 hours after meals >>>Elevate head of the bed including entire chest >>>Reduce size of meals and amount of fat, acid, spices, caffeine and sweets >>>If you are smoking, Please stop! >>>Decrease alcohol consumption >>>Work on maintaining a healthy weight with normal BMI      03/21/2019  f/u ov/Laura [name] re: rhinitis/cough and sob  - now thinks her symptoms might be coming  back due to change of seasons(note the original pattern of symptoms  was not a seasonal / recurrent pattern but rather refractory chronic sob/ cough as above which she says resolved on gerd rx and never took dulera or zyrtec as rec  Chief Complaint  Patient presents with  . Follow-up    Pt c/o nasal congestion, runny nose, and non prod cough over the past few days. She states she has to constantly clear her throat.   Dyspnea:   Working in yard x sev years doe - also problems with steps since Jan 2020  Cough: fine until week prior to ov and has not tried the zytect  Sleeping: no / still able to lie flat SABA use: has dulera never used   02: no    No obvious day to day or daytime variability or assoc excess/ purulent sputum or mucus plugs or hemoptysis or cp or chest tightness, subjective wheeze or overt sinus or hb symptoms.   Sleeping now  without nocturnal  or early am exacerbation  of respiratory  c/o's or need for noct saba. Also denies any obvious fluctuation of symptoms with weather or environmental changes or other aggravating or alleviating factors except as outlined above   No unusual exposure hx or h/o childhood pna/ asthma or knowledge of premature birth.  Current Allergies, Complete Past Medical History, Past Surgical History, Family History, and Social History were reviewed in [employer] record.  ROS  The following are not active complaints unless bolded Hoarseness, sore throat, dysphagia, dental problems, itching, sneezing,  nasal congestion or discharge of excess mucus or purulent secretions, ear ache,   fever, chills, sweats, unintended wt loss or wt gain, classically pleuritic or exertional cp,  orthopnea pnd or arm/hand swelling  or leg swelling, presyncope, palpitations, abdominal pain, anorexia, nausea, vomiting, diarrhea  or change in bowel habits or change in bladder habits, change in stools or change in urine, dysuria, hematuria,  rash, arthralgias, visual complaints, headache, numbness, weakness or ataxia or problems with walking or coordination,  change in mood or  memory.        Current Meds  Medication Sig  . atorvastatin (LIPITOR) 80 MG tablet TAKE 1/2 TABLET BY MOUTH DAILY FOR CHOLESTEROL  . diazepam (VALIUM) 10 MG tablet Take 10 mg by mouth every 12 (twelve) hours as needed for anxiety.  . famotidine (PEPCID) 20 MG tablet One after supper  . fexofenadine (ALLEGRA) 180 MG tablet Take 180 mg by mouth daily as needed for allergies or rhinitis.  . hydrALAZINE (APRESOLINE) 50 MG tablet Take 50 mg by mouth daily.   . irbesartan (AVAPRO) 300 MG tablet Take 300 mg by mouth daily.   Marland Kitchen levothyroxine (SYNTHROID, LEVOTHROID) 88 MCG tablet Take 88 mcg by mouth daily before breakfast.  . metoprolol succinate (TOPROL-XL) 25 MG 24 hr tablet Take 37.5 mg by mouth daily.  . mometasone-formoterol (DULERA) 100-5 MCG/ACT AERO Inhale 2 puffs into the lungs 2 (two) times daily.  Marland Kitchen NUVIGIL 250 MG tablet Take 250 mg by mouth as needed.  . pantoprazole (PROTONIX) 40 MG tablet TAKE ONE TABLET BY MOUTH DAILY.   TAKE 30-60 MINUTES BEFORE FIRST MEAL OF THE DAY  . [DISCONTINUED] metoprolol succinate (TOPROL-XL) 25 MG 24 hr tablet Take 1.5 tablets (37.5 mg total) by mouth every morning AND 1 tablet (25 mg total) every evening. Take with or immediately following a meal. .  Past Medical History:  Diagnosis Date  . Anemia   . Anxiety   . Asthma    related to sesonal allergies  . Chronic combined systolic and diastolic CHF, NYHA class 2 ([place]) CARDIOLOGIST-  DR XBWIOMBT  . Depression   . History of kidney stones   . History of non-ST elevation myocardial infarction (NSTEMI) JUNE 2011   SECONDARY TO TAKOTSUDO SYNDROME (CARDIAC CATH NORMAL)  . Hyperlipemia   . Hypertension   . Hypoglycemia   . Hypothyroidism   . LBBB (left bundle branch block)   . Left ventricular ejection fraction less than 40%    38% PER CARDIOLOGIST NOTE (DR CROITORU)  . Memory loss   . Mood swings   . Nonischemic dilated cardiomyopathy ([place])    MODERATELY DEPRESSED LVF;EF 35-45% by Echo 05/27/11  . OSA (obstructive sleep apnea) MODERATE PER STUDY 2005   CPAP NONCOMPLIANT  . Seasonal allergies   . SUI (stress urinary incontinence, female)       Objective:        amb wf no longer coughing / very easily gets off topic answering questions re her symptoms    03/21/2019      180   09/19/18 181 lb 9.6 oz (82.4 kg)  08/05/17 186 lb 6.4 oz (84.6 kg)  08/03/17 188 lb 11.4 oz (85.6 kg)     Vital signs reviewed - Note on arrival 02 sats  99% on RA      HEENT : pt wearing mask not removed  for exam due to covid -19 concerns.    NECK :  without JVD/Nodes/TM/ nl carotid upstrokes bilaterally   LUNGS: no acc muscle use,  Nl contour chest which is clear to A and P bilaterally without cough on insp or exp maneuvers   CV:  RRR  no s3 or murmur or increase in P2, and no edema   ABD:  soft and nontender with nl inspiratory excursion in the supine position. No bruits or organomegaly appreciated, bowel sounds nl  MS:  Nl gait/ ext warm without deformities, calf tenderness, cyanosis or clubbing No obvious joint restrictions   SKIN: warm and dry without lesions    NEURO:  alert, approp, nl sensorium with  no motor or cerebellar deficits apparent.               CXR PA and Lateral:   03/21/2019 :    I personally reviewed images and  impression as follows:   1.  No radiographic evidence of acute cardiopulmonary disease. 2. Aortic atherosclerosis.          Assessment

## 2019-03-21 NOTE — Assessment & Plan Note (Signed)
Dx in her 84's ? Pos resp to allergy shots  Sinus MRI  08/19/17  taken with brain scan : Sinuses/Orbits: Negative- 09/19/2018  After extensive coaching inhaler device,  effectiveness =    50% with baseline < 25% so try diulera 100 2bid > did not take - Allergy profile 09/19/2018 >  Eos 0.2 /  IgE  60 RAST neg   Clearly better on gerd rx but insisting it's all a seasonal allergy which seem unlikely and instead strongly favor uacs/ cyclical coughing  Of the three most common causes of  Sub-acute / recurrent or chronic cough, only one (GERD)  can actually contribute to/ trigger  the other two (asthma and post nasal drip syndrome)  and perpetuate the cylce of cough.  While not intuitively obvious, many patients with chronic low grade reflux do not cough until there is a primary insult that disturbs the protective epithelial barrier and exposes sensitive nerve endings.   This is typically viral but can due to PNDS(from "allergies" or non-specific irritants) and  either may apply here.     >>> The point is that once this occurs, it is difficult to eliminate the cycle  using anything but a maximally effective acid suppression regimen at least in the short run, accompanied by an appropriate diet to address non acid GERD and eliminate the pnds with zyrtec as the newer antihistamines although less sedating are also less effective in this setting.   >>> keep dulera 100 on hand in case she has more symptoms while on gerd rx  >>> req Dr Seward Meth eval    F/u in 3 m, call sooner if needed

## 2019-03-21 NOTE — Patient Instructions (Addendum)
Continue your protonix 40 mg Take 30-60 min before first meal of the day and pepcid 20 mg one after supper    Try zyrtec 10 mg one daily as needed for itchy/nasal congestion/throat drainage  If breathing getting worse, start dulera 100 2puff every 12 hours as needed   I will see if I can help you with your allergies after I review Dr Seward Meth records   Please remember to go to the  x-ray department  for your tests - we will call you with the results when they are available     Please schedule a follow up office visit in 6 weeks, call sooner if needed  - walking sats next ov

## 2019-03-21 NOTE — Progress Notes (Signed)
Called and left a detailed msg on machine with cxr results ok per [facility]

## 2019-03-21 NOTE — Assessment & Plan Note (Signed)
Gradually worse ex tol  since 2017 / orthopnea since 2020> resolved on gerd rx 03/21/2019  - trial of max gerd rx 09/19/2018   Needs walking sats next ov

## 2019-03-29 DIAGNOSIS — F4310 Post-traumatic stress disorder, unspecified: Secondary | ICD-10-CM | POA: Diagnosis not present

## 2019-03-29 DIAGNOSIS — F332 Major depressive disorder, recurrent severe without psychotic features: Secondary | ICD-10-CM | POA: Diagnosis not present

## 2019-03-29 DIAGNOSIS — F411 Generalized anxiety disorder: Secondary | ICD-10-CM | POA: Diagnosis not present

## 2019-04-11 DIAGNOSIS — F4310 Post-traumatic stress disorder, unspecified: Secondary | ICD-10-CM | POA: Diagnosis not present

## 2019-04-11 DIAGNOSIS — F332 Major depressive disorder, recurrent severe without psychotic features: Secondary | ICD-10-CM | POA: Diagnosis not present

## 2019-04-11 DIAGNOSIS — F411 Generalized anxiety disorder: Secondary | ICD-10-CM | POA: Diagnosis not present

## 2019-04-24 DIAGNOSIS — F332 Major depressive disorder, recurrent severe without psychotic features: Secondary | ICD-10-CM | POA: Diagnosis not present

## 2019-04-24 DIAGNOSIS — F411 Generalized anxiety disorder: Secondary | ICD-10-CM | POA: Diagnosis not present

## 2019-04-24 DIAGNOSIS — F4310 Post-traumatic stress disorder, unspecified: Secondary | ICD-10-CM | POA: Diagnosis not present

## 2019-04-26 DIAGNOSIS — F332 Major depressive disorder, recurrent severe without psychotic features: Secondary | ICD-10-CM | POA: Diagnosis not present

## 2019-04-26 DIAGNOSIS — F411 Generalized anxiety disorder: Secondary | ICD-10-CM | POA: Diagnosis not present

## 2019-04-26 DIAGNOSIS — F4310 Post-traumatic stress disorder, unspecified: Secondary | ICD-10-CM | POA: Diagnosis not present

## 2019-05-02 ENCOUNTER — Encounter: Payer: Self-pay | Admitting: Internal Medicine

## 2019-05-02 ENCOUNTER — Other Ambulatory Visit: Payer: Self-pay

## 2019-05-02 ENCOUNTER — Ambulatory Visit (INDEPENDENT_AMBULATORY_CARE_PROVIDER_SITE_OTHER): Payer: Medicare Other | Admitting: Internal Medicine

## 2019-05-02 DIAGNOSIS — J45991 Cough variant asthma: Secondary | ICD-10-CM | POA: Diagnosis not present

## 2019-05-02 NOTE — Patient Instructions (Addendum)
For drainage / throat tickle try take CHLORPHENIRAMINE  4 mg  (Chlortab 4mg   at [employer] should be easiest to find in the green box)  take one every 4 hours as needed - available over the counter- may cause drowsiness so start with just a bedtime dose or two and see how you tolerate it before trying in daytime     If not better to your satisfaction you need to see ENT re your throat symptoms   If you are satisfied with your treatment plan,  let your doctor know and he/she can either refill your medications or you can return here when your prescription runs out.     If in any way you are not 100% satisfied,  please tell us.  If 100% better, tell your friends!  Pulmonary follow up is as needed

## 2019-05-02 NOTE — Progress Notes (Signed)
Laura [name], female    DOB: Aug 05, 1945, 73 y.o.   MRN: [id number]   Brief patient profile:  26 yowf never smoker last seen in pulmonary clinic by Dr Annamaria Boots:  10/01/14 noncompliant with cpap and referred to pulmonary clinic 09/19/2018 by Dr Samara Snide re: recurrent cough and sob.   Pt developed allergies/asthma in her 10's (maybe even back further but never saw specialist) had sinus surgery in 1985 and saw Dr Minette Brine shots seemed to helped x maybe 1.5 years much less need for inhalers (some symptoms but never inhalers) then around 2017 worse trouble with sob and cough different from prior years but still only used intermitterently until  December 2019 and never better since with sooner after lying down nightly and forced to sleep in 60 recliner / has not been able to tol cpap as above.    History of Present Illness  09/19/2018  Pulmonary/ 1st office eval/Kin Galbraith  Chief Complaint  Patient presents with  . Pulmonary Consult    Self referral. Pt c/o recurrent bronchitis over the past 3 years. She states at least 3 x per year she gets SOB and cough. She c/o persisent cough and SOB since 09/12/2018. Cough is prod with light yellow sputu. Cough is worse when she lies.   Dyspnea: indolent onset gradually worse to point now [facility] = can't walk 100 yards even at a slow pace at a flat grade s stopping due to sob   Cough:  Almost immediate hs / light yellow  Sleep: 60 degrees in recliner SABA use: albuterol no helping  sev rounds of prednisone smidge better transiently "but not like in past when worked well" rec Continue your protonix 40 mg Take 30-60 min before first meal of the day and pepcid 20 mg one after supper  Try zyrtec 10 mg one daily as needed for itchy/nasal congestion/throat drainage If breathing getting worse, start dulera 100 2puff every 12 hours as needed     05/02/2019  f/u ov/Rhythm [facility]fall re: uacs  - doubt cough variant asthma never took [employer]  Patient presents with   . Follow-up    Cough has improved some since the last visit.   Dyspnea:  Still MMRC2 = can't walk a nl pace on a flat grade s sob but does fine slow and flat  Cough: mostly just sensation of globus no better allegra or zyrtec Sleeping: no resp symptoms, 60 degrees  X years  SABA use: none  02: none     No obvious day to day or daytime variability or assoc excess/ purulent sputum or mucus plugs or hemoptysis or cp or chest tightness, subjective wheeze or overt sinus or hb symptoms.   Sleeping as above  without nocturnal  or early am exacerbation  of respiratory  c/o's or need for noct saba. Also denies any obvious fluctuation of symptoms with weather or environmental changes or other aggravating or alleviating factors except as outlined above   No unusual exposure hx or h/o childhood pna/ asthma or knowledge of premature birth.  Current Allergies, Complete Past Medical History, Past Surgical History, Family History, and Social History were reviewed in [employer] record.  ROS  The following are not active complaints unless bolded Hoarseness, sore throat, dysphagia, dental problems, itching, sneezing,  nasal congestion or discharge of excess mucus or purulent secretions, ear ache,   fever, chills, sweats, unintended wt loss or wt gain, classically pleuritic or exertional cp,  orthopnea pnd  or arm/hand swelling  or leg swelling, presyncope, palpitations, abdominal pain, anorexia, nausea, vomiting, diarrhea  or change in bowel habits or change in bladder habits, change in stools or change in urine, dysuria, hematuria,  rash, arthralgias, visual complaints, headache, numbness, weakness or ataxia or problems with walking or coordination,  change in mood or  memory.       Not clear what she is actually taking at this point for meds  No outpatient medications have been marked as taking for the 05/02/19 encounter (Office Visit) with Tanda Rockers, MD.                 Past Medical History:  Diagnosis Date  . Anemia   . Anxiety   . Asthma    related to sesonal allergies  . Chronic combined systolic and diastolic CHF, NYHA class 2 ([place]) CARDIOLOGIST-  DR UXNATFTD  . Depression   . History of kidney stones   . History of non-ST elevation myocardial infarction (NSTEMI) JUNE 2011   SECONDARY TO TAKOTSUDO SYNDROME (CARDIAC CATH NORMAL)  . Hyperlipemia   . Hypertension   . Hypoglycemia   . Hypothyroidism   . LBBB (left bundle branch block)   . Left ventricular ejection fraction less than 40%    38% PER CARDIOLOGIST NOTE (DR CROITORU)  . Memory loss   . Mood swings   . Nonischemic dilated cardiomyopathy ([place])    MODERATELY DEPRESSED LVF;EF 35-45% by Echo 05/27/11  . OSA (obstructive sleep apnea) MODERATE PER STUDY 2005   CPAP NONCOMPLIANT  . Seasonal allergies   . SUI (stress urinary incontinence, female)        Objective:        05/02/2019       180  09/19/18 181 lb 9.6 oz (82.4 kg)  08/05/17 186 lb 6.4 oz (84.6 kg)  08/03/17 188 lb 11.4 oz (85.6 kg)     amb wf nad occ throat clearing   BP 124/64 (BP Location: Left Arm, Cuff Size: Normal)   Pulse (!) 54   Temp (!) 97.4 F (36.3 C) (Temporal)   Ht 5' 3.5" (1.613 m)   Wt 180 lb 9.6 oz (81.9 kg)   SpO2 100%   BMI 31.49 kg/m     HEENT : pt wearing mask not removed for exam due to covid -19 concerns.    NECK :  without JVD/Nodes/TM/ nl carotid upstrokes bilaterally   LUNGS: no acc muscle use,  Nl contour chest which is clear to A and P bilaterally without cough on insp or exp maneuvers   CV:  RRR  no s3 or murmur or increase in P2, and no edema   ABD:  soft and nontender with nl inspiratory excursion in the supine position. No bruits or organomegaly appreciated, bowel sounds nl  MS:  Nl gait/ ext warm without deformities, calf tenderness, cyanosis or clubbing No obvious joint restrictions   SKIN: warm and dry without lesions    NEURO:  alert, approp, nl sensorium with  no  motor or cerebellar deficits apparent.               Assessment

## 2019-05-03 ENCOUNTER — Encounter: Payer: Self-pay | Admitting: Internal Medicine

## 2019-05-03 ENCOUNTER — Ambulatory Visit (HOSPITAL_BASED_OUTPATIENT_CLINIC_OR_DEPARTMENT_OTHER): Payer: Medicare Other | Attending: Cardiovascular Disease | Admitting: Cardiovascular Disease

## 2019-05-03 DIAGNOSIS — G4730 Sleep apnea, unspecified: Secondary | ICD-10-CM | POA: Diagnosis not present

## 2019-05-03 DIAGNOSIS — G4733 Obstructive sleep apnea (adult) (pediatric): Secondary | ICD-10-CM | POA: Diagnosis not present

## 2019-05-03 NOTE — Assessment & Plan Note (Signed)
Dx in her 44's ? Pos resp to allergy shots  [facility] eval 07/26/18   FENO 9 / skin testing positive mold spores and grass pollens rec prn saba and allegra Sinus MRI  08/19/17  taken with brain scan : Sinuses/Orbits: Negative - 09/19/2018  After extensive coaching inhaler device,  effectiveness =    50% with baseline < 25% so try diulera 100 2bid > did not take - Allergy profile 09/19/2018 >  Eos 0.2 /  IgE  60 RAST neg  - 05/02/2019 rec 1st gen H1 blockers per guidelines  And f/u ent   She may have some pnds allergy related but most of her problem appears to be Upper airway cough syndrome (previously labeled PNDS),  is so named because it's frequently impossible to sort out how much is  CR/sinusitis with freq throat clearing (which can be related to primary GERD)   vs  causing  secondary (" extra esophageal")  GERD from wide swings in gastric pressure that occur with throat clearing, often  promoting self use of mint and menthol lozenges that reduce the lower esophageal sphincter tone and exacerbate the problem further in a cyclical fashion.   These are the same pts (now being labeled as having "irritable larynx syndrome" by some cough centers) who not infrequently have a history of having failed to tolerate ace inhibitors,  dry powder inhalers or biphosphonates or report having atypical/extraesophageal reflux symptoms that don't respond to standard doses of PPI  and are easily confused as having aecopd or asthma flares by even experienced allergists/ pulmonologists (myself included).    rec 1st gen H1 blockers per guidelines  And f/u with ent  Pulmonary f/u is prn

## 2019-05-09 ENCOUNTER — Encounter (HOSPITAL_BASED_OUTPATIENT_CLINIC_OR_DEPARTMENT_OTHER): Payer: Self-pay | Admitting: Cardiovascular Disease

## 2019-05-09 NOTE — Procedures (Signed)
     Patient Name: Laura [name], Laura [name] Date: 05/03/2019 Gender: Female D.O.B: 08-27-1945 Age (years): 34 Referring Provider: Shelva Majestic MD, ABSM Height (inches): 11 Interpreting Physician: Shelva Majestic MD, ABSM Weight (lbs): 181 RPSGT: Jacolyn Reedy BMI: 32 MRN: [id number] Neck Size: 14.50  CLINICAL INFORMATION Sleep Study Type: HST  Indication for sleep study: OSA. snoring; failed oral appliance  Epworth Sleepiness Score: 4  SLEEP STUDY TECHNIQUE A multi-channel overnight portable sleep study was performed. The channels recorded were: nasal airflow, thoracic respiratory movement, and oxygen saturation with a pulse oximetry. Snoring was also monitored.  MEDICATIONS     atorvastatin (LIPITOR) 80 MG tablet             diazepam (VALIUM) 10 MG tablet         hydrALAZINE (APRESOLINE) 50 MG tablet         irbesartan (AVAPRO) 300 MG tablet         levothyroxine (SYNTHROID, LEVOTHROID) 88 MCG tablet         metoprolol succinate (TOPROL-XL) 25 MG 24 hr tablet         NUVIGIL 250 MG tablet         spironolactone (ALDACTONE) 25 MG tablet (Expired)      Patient self administered medications include: N/A.  SLEEP ARCHITECTURE Patient was studied for 372.7 minutes. The sleep efficiency was 98.8 % and the patient was supine for 99.9%. The arousal index was 0.0 per hour.  RESPIRATORY PARAMETERS The overall AHI was 26.9 per hour, with a central apnea index of 0.0 per hour.  The oxygen nadir was 74% during sleep.  CARDIAC DATA Mean heart rate during sleep was 69.3 bpm.  IMPRESSIONS - Moderate obstructive sleep apnea occurred during this study (AHI 26.9/h). The severity of sleep apnea during REM sleep cannot be assessed on this home study. - No significant central sleep apnea occurred during this study (CAI 0.0/h). - Severe oxygen desaturation to a nadir of 74%; time spent below 89% 7.3 minutes - Patient snored 32.3% during the sleep.  DIAGNOSIS - Obstructive Sleep  Apnea (327.23 [G47.33 ICD-10]) - Nocturnal Hypoxemia (327.26 [G47.36 ICD-10])  RECOMMENDATIONS - In this patient with previously documendted severe sleep apnea and significant oxygen desaturation to 74%, recommend an in-lab CPAP titration study.   - Effort should be made to optimize nasal and oropharyngeal patency. - Positional therapy avoiding supine position during sleep. - Avoid alcohol, sedatives and other CNS depressants that may worsen sleep apnea and disrupt normal sleep architecture. - Sleep hygiene should be reviewed to assess factors that may improve sleep quality. - Weight management (BMI 32) and regular exercise should be initiated or continued. - Recommend a download after 30 days of CPAP and sleep clinic evaluation after 4 weeks of therapy.   [Electronically signed] 05/09/2019 10:56 AM  Shelva Majestic MD, [facility], [place], American Board of Sleep Medicine   NPI: [id number] [place] PH: [phone]   FX: [phone] [place]

## 2019-05-12 ENCOUNTER — Other Ambulatory Visit: Payer: Self-pay

## 2019-05-12 MED ORDER — HYDRALAZINE HCL 50 MG PO TABS: 50.0000 mg | ORAL_TABLET | Freq: Every day | ORAL | 1 refills | Status: DC

## 2019-05-15 ENCOUNTER — Other Ambulatory Visit: Payer: Self-pay

## 2019-05-15 MED ORDER — HYDRALAZINE HCL 50 MG PO TABS: 50.0000 mg | ORAL_TABLET | Freq: Every day | ORAL | 1 refills | Status: DC

## 2019-05-29 DIAGNOSIS — F332 Major depressive disorder, recurrent severe without psychotic features: Secondary | ICD-10-CM | POA: Diagnosis not present

## 2019-05-29 DIAGNOSIS — F4310 Post-traumatic stress disorder, unspecified: Secondary | ICD-10-CM | POA: Diagnosis not present

## 2019-05-29 DIAGNOSIS — F411 Generalized anxiety disorder: Secondary | ICD-10-CM | POA: Diagnosis not present

## 2019-06-01 DIAGNOSIS — F4310 Post-traumatic stress disorder, unspecified: Secondary | ICD-10-CM | POA: Diagnosis not present

## 2019-06-01 DIAGNOSIS — F411 Generalized anxiety disorder: Secondary | ICD-10-CM | POA: Diagnosis not present

## 2019-06-01 DIAGNOSIS — F332 Major depressive disorder, recurrent severe without psychotic features: Secondary | ICD-10-CM | POA: Diagnosis not present

## 2019-07-19 DIAGNOSIS — F331 Major depressive disorder, recurrent, moderate: Secondary | ICD-10-CM | POA: Diagnosis not present

## 2019-07-19 DIAGNOSIS — R110 Nausea: Secondary | ICD-10-CM | POA: Diagnosis not present

## 2019-07-19 DIAGNOSIS — E7800 Pure hypercholesterolemia, unspecified: Secondary | ICD-10-CM | POA: Diagnosis not present

## 2019-07-19 DIAGNOSIS — I10 Essential (primary) hypertension: Secondary | ICD-10-CM | POA: Diagnosis not present

## 2019-07-19 DIAGNOSIS — Z0000 Encounter for general adult medical examination without abnormal findings: Secondary | ICD-10-CM | POA: Diagnosis not present

## 2019-07-19 DIAGNOSIS — E039 Hypothyroidism, unspecified: Secondary | ICD-10-CM | POA: Diagnosis not present

## 2019-07-19 DIAGNOSIS — F411 Generalized anxiety disorder: Secondary | ICD-10-CM | POA: Diagnosis not present

## 2019-07-19 DIAGNOSIS — R109 Unspecified abdominal pain: Secondary | ICD-10-CM | POA: Diagnosis not present

## 2019-07-19 DIAGNOSIS — K921 Melena: Secondary | ICD-10-CM | POA: Diagnosis not present

## 2019-07-20 DIAGNOSIS — F332 Major depressive disorder, recurrent severe without psychotic features: Secondary | ICD-10-CM | POA: Diagnosis not present

## 2019-07-20 DIAGNOSIS — F411 Generalized anxiety disorder: Secondary | ICD-10-CM | POA: Diagnosis not present

## 2019-07-20 DIAGNOSIS — F4310 Post-traumatic stress disorder, unspecified: Secondary | ICD-10-CM | POA: Diagnosis not present

## 2019-07-21 DIAGNOSIS — R1013 Epigastric pain: Secondary | ICD-10-CM | POA: Diagnosis not present

## 2019-07-21 DIAGNOSIS — F4310 Post-traumatic stress disorder, unspecified: Secondary | ICD-10-CM | POA: Diagnosis not present

## 2019-07-21 DIAGNOSIS — R110 Nausea: Secondary | ICD-10-CM | POA: Diagnosis not present

## 2019-07-21 DIAGNOSIS — F411 Generalized anxiety disorder: Secondary | ICD-10-CM | POA: Diagnosis not present

## 2019-07-21 DIAGNOSIS — F332 Major depressive disorder, recurrent severe without psychotic features: Secondary | ICD-10-CM | POA: Diagnosis not present

## 2019-07-28 DIAGNOSIS — Z01818 Encounter for other preprocedural examination: Secondary | ICD-10-CM | POA: Diagnosis not present

## 2019-07-31 ENCOUNTER — Other Ambulatory Visit: Payer: Self-pay | Admitting: Cardiovascular Disease

## 2019-07-31 DIAGNOSIS — Z860100 Personal history of colon polyps, unspecified: Secondary | ICD-10-CM

## 2019-07-31 DIAGNOSIS — G4733 Obstructive sleep apnea (adult) (pediatric): Secondary | ICD-10-CM

## 2019-07-31 HISTORY — DX: Personal history of colon polyps, unspecified: Z86.0100

## 2019-08-03 DIAGNOSIS — Z1211 Encounter for screening for malignant neoplasm of colon: Secondary | ICD-10-CM | POA: Diagnosis not present

## 2019-08-03 DIAGNOSIS — R1915 Other abnormal bowel sounds: Secondary | ICD-10-CM | POA: Diagnosis not present

## 2019-08-03 DIAGNOSIS — K449 Diaphragmatic hernia without obstruction or gangrene: Secondary | ICD-10-CM | POA: Diagnosis not present

## 2019-08-03 DIAGNOSIS — R110 Nausea: Secondary | ICD-10-CM | POA: Diagnosis not present

## 2019-08-03 DIAGNOSIS — D122 Benign neoplasm of ascending colon: Secondary | ICD-10-CM | POA: Diagnosis not present

## 2019-08-03 DIAGNOSIS — K5730 Diverticulosis of large intestine without perforation or abscess without bleeding: Secondary | ICD-10-CM | POA: Diagnosis not present

## 2019-08-03 DIAGNOSIS — K648 Other hemorrhoids: Secondary | ICD-10-CM | POA: Diagnosis not present

## 2019-08-03 DIAGNOSIS — K3189 Other diseases of stomach and duodenum: Secondary | ICD-10-CM | POA: Diagnosis not present

## 2019-08-03 DIAGNOSIS — D123 Benign neoplasm of transverse colon: Secondary | ICD-10-CM | POA: Diagnosis not present

## 2019-08-03 DIAGNOSIS — K2930 Chronic superficial gastritis without bleeding: Secondary | ICD-10-CM | POA: Diagnosis not present

## 2019-08-03 DIAGNOSIS — K317 Polyp of stomach and duodenum: Secondary | ICD-10-CM | POA: Diagnosis not present

## 2019-08-03 DIAGNOSIS — K921 Melena: Secondary | ICD-10-CM | POA: Diagnosis not present

## 2019-08-03 DIAGNOSIS — K635 Polyp of colon: Secondary | ICD-10-CM | POA: Diagnosis not present

## 2019-08-03 DIAGNOSIS — R109 Unspecified abdominal pain: Secondary | ICD-10-CM | POA: Diagnosis not present

## 2019-08-03 DIAGNOSIS — R1013 Epigastric pain: Secondary | ICD-10-CM | POA: Diagnosis not present

## 2019-08-03 LAB — HM COLONOSCOPY

## 2019-08-07 ENCOUNTER — Other Ambulatory Visit (HOSPITAL_COMMUNITY)
Admission: RE | Admit: 2019-08-07 | Discharge: 2019-08-07 | Disposition: A | Discharge status: Home / Self Care | Payer: Medicare Other | Source: Ambulatory Visit | Attending: Cardiovascular Disease | Admitting: Cardiovascular Disease

## 2019-08-07 DIAGNOSIS — Z20822 Contact with and (suspected) exposure to covid-19: Secondary | ICD-10-CM | POA: Diagnosis not present

## 2019-08-07 DIAGNOSIS — Z01812 Encounter for preprocedural laboratory examination: Secondary | ICD-10-CM | POA: Insufficient documentation

## 2019-08-07 LAB — SARS CORONAVIRUS 2 (TAT 6-24 HRS): SARS Coronavirus 2: NEGATIVE

## 2019-08-10 ENCOUNTER — Other Ambulatory Visit: Payer: Self-pay

## 2019-08-10 ENCOUNTER — Ambulatory Visit (HOSPITAL_BASED_OUTPATIENT_CLINIC_OR_DEPARTMENT_OTHER): Payer: Medicare Other | Attending: Cardiovascular Disease | Admitting: Cardiovascular Disease

## 2019-08-10 DIAGNOSIS — I493 Ventricular premature depolarization: Secondary | ICD-10-CM | POA: Diagnosis not present

## 2019-08-10 DIAGNOSIS — G4733 Obstructive sleep apnea (adult) (pediatric): Secondary | ICD-10-CM | POA: Diagnosis not present

## 2019-08-21 ENCOUNTER — Encounter (HOSPITAL_BASED_OUTPATIENT_CLINIC_OR_DEPARTMENT_OTHER): Payer: Self-pay | Admitting: Cardiovascular Disease

## 2019-08-21 DIAGNOSIS — F332 Major depressive disorder, recurrent severe without psychotic features: Secondary | ICD-10-CM | POA: Diagnosis not present

## 2019-08-21 DIAGNOSIS — F411 Generalized anxiety disorder: Secondary | ICD-10-CM | POA: Diagnosis not present

## 2019-08-21 DIAGNOSIS — F4310 Post-traumatic stress disorder, unspecified: Secondary | ICD-10-CM | POA: Diagnosis not present

## 2019-08-21 NOTE — Procedures (Signed)
Patient Name: Laura [name], Laura [name] Date: 08/10/2019 Gender: Female D.O.B: 1946/06/05 Age (years): 46 Referring Provider: Shelva Majestic MD, ABSM Height (inches): 63 Interpreting Physician: Shelva Majestic MD, ABSM Weight (lbs): 124 RPSGT: Zadie Rhine BMI: 22 MRN: [id number] Neck Size: 14.00  CLINICAL INFORMATION The patient is referred for a CPAP titration to treat sleep apnea.  Date of HST: 05/03/2019:  AHI 26.9/h; O2 nadir 74%.  SLEEP STUDY TECHNIQUE As per the AASM Manual for the Scoring of Sleep and Associated Events v2.3 (April 2016) with a hypopnea requiring 4% desaturations.  The channels recorded and monitored were frontal, central and occipital EEG, electrooculogram (EOG), submentalis EMG (chin), nasal and oral airflow, thoracic and abdominal wall motion, anterior tibialis EMG, snore microphone, electrocardiogram, and pulse oximetry. Continuous positive airway pressure (CPAP) was initiated at the beginning of the study and titrated to treat sleep-disordered breathing.  MEDICATIONS Medications self-administered by patient taken the night of the study : [place] Comments added by technician: NO RESTROOM VISTED. Patient had difficulty initiating sleep. Patient was restless all through the night. Comments added by scorer: N/A  RESPIRATORY PARAMETERS Optimal PAP Pressure (cm): 11 AHI at Optimal Pressure (/hr): 0.0 Overall Minimal O2 (%): 92.0 Supine % at Optimal Pressure (%): 0 Minimal O2 at Optimal Pressure (%): 94.0   SLEEP ARCHITECTURE The study was initiated at 10:35:57 PM and ended at 4:31:29 AM.  Sleep onset time was 66.5 minutes and the sleep efficiency was 59.1%%. The total sleep time was 210 minutes.  The patient spent 24.0%% of the night in stage N1 sleep, 60.7%% in stage N2 sleep, 0.0%% in stage N3 and 15.2% in REM.Stage REM latency was 239.0 minutes  Wake after sleep onset was 79.1. Alpha intrusion was absent. Supine sleep was  0.00%.  CARDIAC DATA The 2 lead EKG demonstrated sinus rhythm. The mean heart rate was 61.8 beats per minute. Other EKG findings include: PVCs.  LEG MOVEMENT DATA The total Periodic Limb Movements of Sleep (PLMS) were 0. The PLMS index was 0.0. A PLMS index of <15 is considered normal in adults.  IMPRESSIONS - CPAP was initated at 5 cm and was titrated to optimal PAP pressure at 11 cm of water; AHI 0; O2 nadir 94%. - Central sleep apnea was not noted during this titration (CAI = 0.0/h). - Significant oxygen desaturations were not observed during this titration (min O2 = 92.0%). - No snoring was audible during this study. - 2-lead EKG demonstrated: frequent PVCs with rare multifocal couplets. - Clinically significant periodic limb movements were not noted during this study. Arousals associated with PLMs were rare.  DIAGNOSIS - Obstructive Sleep Apnea (327.23 [G47.33 ICD-10])  RECOMMENDATIONS - Recommend an initial trial of CPAP therapy with EPR at 11 cm H2O with heated humidification. A Small size Resmed Full Face Mask AirFit F30 mask was used for the titration. Effort should be made to optimize nasal and oropharyngeal patency. - Avoid alcohol, sedatives and other CNS depressants that may worsen sleep apnea and disrupt normal sleep architecture. - Sleep hygiene should be reviewed to assess factors that may improve sleep quality. - Weight management and regular exercise should be initiated or continued. - Recommend a download in 30 days and sleep clinic evaluation after 4 weeks of therapy.   [Electronically signed] 08/21/2019 09:57 AM  Shelva Majestic MD, [facility], [place], American Board of Sleep Medicine   NPI: [id number] [place] PH: [phone]   FX: [phone]  ACADEMY OF SLEEP MEDICINE

## 2019-08-23 ENCOUNTER — Telehealth: Payer: Self-pay | Admitting: *Deleted

## 2019-08-23 NOTE — Telephone Encounter (Signed)
Patient notified sleep study has been completed. CPAP set up order sent to [facility].

## 2019-09-01 ENCOUNTER — Ambulatory Visit: Payer: Medicare Other | Admitting: Cardiovascular Disease

## 2019-09-27 ENCOUNTER — Telehealth: Payer: Self-pay | Admitting: Cardiovascular Disease

## 2019-09-27 DIAGNOSIS — Z8719 Personal history of other diseases of the digestive system: Secondary | ICD-10-CM | POA: Diagnosis not present

## 2019-09-27 DIAGNOSIS — R198 Other specified symptoms and signs involving the digestive system and abdomen: Secondary | ICD-10-CM | POA: Diagnosis not present

## 2019-09-27 DIAGNOSIS — K5900 Constipation, unspecified: Secondary | ICD-10-CM | POA: Diagnosis not present

## 2019-09-27 DIAGNOSIS — R110 Nausea: Secondary | ICD-10-CM | POA: Diagnosis not present

## 2019-09-27 NOTE — Telephone Encounter (Signed)
Left message for patient to call and schedule Sleep Compliance appointment between 10/06/19 and 6/8/21with Dr. Agnes Lawrence of CPAP set up 09/05/19

## 2019-09-29 NOTE — Telephone Encounter (Signed)
Left message for patient to call and schedule Sleep Compliance appointment with Dr. Agnes Lawrence of CPAP set up 09/05/19---appointment has to be within 90 days of set up

## 2019-10-06 DIAGNOSIS — F411 Generalized anxiety disorder: Secondary | ICD-10-CM | POA: Diagnosis not present

## 2019-10-06 DIAGNOSIS — F4310 Post-traumatic stress disorder, unspecified: Secondary | ICD-10-CM | POA: Diagnosis not present

## 2019-10-06 DIAGNOSIS — F332 Major depressive disorder, recurrent severe without psychotic features: Secondary | ICD-10-CM | POA: Diagnosis not present

## 2019-10-11 ENCOUNTER — Telehealth: Payer: Self-pay | Admitting: Cardiovascular Disease

## 2019-10-11 MED ORDER — METOPROLOL SUCCINATE ER 25 MG PO TB24: 37.5000 mg | ORAL_TABLET | Freq: Every day | ORAL | 1 refills | Status: DC

## 2019-10-11 NOTE — Telephone Encounter (Signed)
Pt c/o medication issue:  1. Name of Medication: metoprolol succinate (TOPROL-XL) 25 MG 24 hr tablet  2. How are you currently taking this medication (dosage and times per day)? 37.5 mg daily  3. Are you having a reaction (difficulty breathing--STAT)? no  4. What is your medication issue? Pt is having a hard time getting the correct dosage from the pharmacy. The patient has a 50 mg pill that she cuts herself twice to get the correct dosage of the medication. The instructions tell her to take 1.5 tablets daily, but it is 1.5 of the 50 mg tablets not 1.5 of the 25 mg tablets  The patient needs to get a 25 mg tablet so that she only has to cut it once. Pharmacy information has been confirmed with the patient    Pt c/o medication issue:  1. Name of Medication: potassium chloride (K-DUR) CR tablet 10 mEq  2. How are you currently taking this medication (dosage and times per day)? Pt is not taking   3. Are you having a reaction (difficulty breathing--STAT)? Leg cramps  4. What is your medication issue? Pt would like to be put back on this medication. She has been having severe leg cramps and would like to be back on this

## 2019-10-11 NOTE — Telephone Encounter (Signed)
Spoke with patient about metoprolol succinate discrepancy. She was given 50mg  tablets previously. She needs 25mg  tablets. Per 02/2019 note: With her palpitations, she was advised to increase her Toprol to 25 mg twice a day which she has not done.  Reviewed her most recent echo Doppler study from June 2020 which now reveals an EF of approximately 45% and findings consistent with her left bundle branch block with septal dyssynergy.  There is grade 1 diastolic dysfunction.  I have recommended further titration of Toprol-XL to at least 37.5 mg once in the morning and she has agreed to do this.   But AVS from this same day says  "Medication Instructions:  INCREASE METOPROLOL 37.5MG  (1-1/2 TAB) IN THE AM AND 50MG  IN THE PM"   Patient also reports leg, ankle, toe cramps. Her PCP stopped potassium 76mEq. She has been off this for 2-3 years. She tries mustard at home for cramping remedy.    Routed to MD concerning these issues

## 2019-10-11 NOTE — Telephone Encounter (Signed)
Okay to prescribe 25 mg tablets.  Alternatively if patient wants to stay on 50 mg tablets taking 50 mg in the morning and 25 mg at night.

## 2019-10-12 NOTE — Telephone Encounter (Signed)
Spironolactone can increase serum potassium. I would avoid concomitant potassium supplementation.

## 2019-10-12 NOTE — Telephone Encounter (Signed)
Called and spoke with pt. She states a nurse from our office was able to straighten out her medications yesterday. She stated she was originally placed on Toprol 50mg  and then her PCP changed her to 25mg , and then Dr.Kelly increased her to 37.5mg  daily. She reports that multiple pharmacies have messed this medication up for her.   She states that she takes all of Dr.Kelly's prescribed medications once in the morning. Reports only being on Toprol 37.5mg  once daily.  She also reports cramps and states that she used to take 75mEq of potassium but had stopped because she was afraid it would hurt her kidney's. She reports that Dr.Kelly placed her on spironolactone. She states she has put herself back on the potassium and when she has cramps she will eat mustard and this combination with the potassium seems to help these cramps.  Notified I would ask Dr.Kelly if this is okay considering she is on a potassium sparing diuretic.   Pt reports that her husband sees Dr.kelly as well and thinks he is due for an appt. Notified that Dr.Kelly is full until July but that he should be opening one week in office. Notified for her to have her husband call in to make an appt. Wife verbalized understanding with no other questions at this time. Will forward to MD to address the potassium intake and cramping.

## 2019-10-13 NOTE — Telephone Encounter (Signed)
Called and left detailed message on home phone per DPR of Dr.Kelly's recommendations for not taking potassium supplements with her spironolactone. And to call her PCP to follow up on cramping. Advised to call us back with any questions.

## 2019-10-18 DIAGNOSIS — F411 Generalized anxiety disorder: Secondary | ICD-10-CM | POA: Diagnosis not present

## 2019-10-18 DIAGNOSIS — F332 Major depressive disorder, recurrent severe without psychotic features: Secondary | ICD-10-CM | POA: Diagnosis not present

## 2019-10-18 DIAGNOSIS — F4310 Post-traumatic stress disorder, unspecified: Secondary | ICD-10-CM | POA: Diagnosis not present

## 2019-10-25 DIAGNOSIS — K5904 Chronic idiopathic constipation: Secondary | ICD-10-CM | POA: Diagnosis not present

## 2019-10-25 DIAGNOSIS — R110 Nausea: Secondary | ICD-10-CM | POA: Diagnosis not present

## 2019-11-02 DIAGNOSIS — M19072 Primary osteoarthritis, left ankle and foot: Secondary | ICD-10-CM | POA: Diagnosis not present

## 2019-11-02 DIAGNOSIS — M25571 Pain in right ankle and joints of right foot: Secondary | ICD-10-CM | POA: Diagnosis not present

## 2019-11-02 DIAGNOSIS — S63641A Sprain of metacarpophalangeal joint of right thumb, initial encounter: Secondary | ICD-10-CM | POA: Diagnosis not present

## 2019-11-02 DIAGNOSIS — M1711 Unilateral primary osteoarthritis, right knee: Secondary | ICD-10-CM | POA: Diagnosis not present

## 2019-11-09 DIAGNOSIS — F411 Generalized anxiety disorder: Secondary | ICD-10-CM | POA: Diagnosis not present

## 2019-11-09 DIAGNOSIS — F4310 Post-traumatic stress disorder, unspecified: Secondary | ICD-10-CM | POA: Diagnosis not present

## 2019-11-09 DIAGNOSIS — M79644 Pain in right finger(s): Secondary | ICD-10-CM | POA: Diagnosis not present

## 2019-11-09 DIAGNOSIS — F332 Major depressive disorder, recurrent severe without psychotic features: Secondary | ICD-10-CM | POA: Diagnosis not present

## 2019-11-10 DIAGNOSIS — F4310 Post-traumatic stress disorder, unspecified: Secondary | ICD-10-CM | POA: Diagnosis not present

## 2019-11-10 DIAGNOSIS — F411 Generalized anxiety disorder: Secondary | ICD-10-CM | POA: Diagnosis not present

## 2019-11-10 DIAGNOSIS — F332 Major depressive disorder, recurrent severe without psychotic features: Secondary | ICD-10-CM | POA: Diagnosis not present

## 2019-11-21 DIAGNOSIS — F4310 Post-traumatic stress disorder, unspecified: Secondary | ICD-10-CM | POA: Diagnosis not present

## 2019-11-21 DIAGNOSIS — F411 Generalized anxiety disorder: Secondary | ICD-10-CM | POA: Diagnosis not present

## 2019-11-21 DIAGNOSIS — F332 Major depressive disorder, recurrent severe without psychotic features: Secondary | ICD-10-CM | POA: Diagnosis not present

## 2019-11-28 DIAGNOSIS — R194 Change in bowel habit: Secondary | ICD-10-CM | POA: Diagnosis not present

## 2019-11-28 DIAGNOSIS — K5900 Constipation, unspecified: Secondary | ICD-10-CM | POA: Diagnosis not present

## 2019-12-19 ENCOUNTER — Telehealth: Payer: Self-pay | Admitting: *Deleted

## 2019-12-19 NOTE — Telephone Encounter (Signed)
Returned a call to patient. She informed me that [facility] picked up her CPAP machine today. I recommended that she keeps her sleep appointment scheduled for this Thursday with Dr Claiborne Billings to discuss what she will need to do going forward.

## 2019-12-21 ENCOUNTER — Ambulatory Visit (INDEPENDENT_AMBULATORY_CARE_PROVIDER_SITE_OTHER): Payer: Medicare Other | Admitting: Cardiovascular Disease

## 2019-12-21 ENCOUNTER — Other Ambulatory Visit: Payer: Self-pay

## 2019-12-21 ENCOUNTER — Encounter: Payer: Self-pay | Admitting: Cardiovascular Disease

## 2019-12-21 VITALS — BP 130/74 | HR 78 | Temp 98.0°F | Ht 63.0 in | Wt 174.2 lb

## 2019-12-21 DIAGNOSIS — I10 Essential (primary) hypertension: Secondary | ICD-10-CM

## 2019-12-21 DIAGNOSIS — R400 Somnolence: Secondary | ICD-10-CM

## 2019-12-21 DIAGNOSIS — I428 Other cardiomyopathies: Secondary | ICD-10-CM | POA: Diagnosis not present

## 2019-12-21 DIAGNOSIS — G4733 Obstructive sleep apnea (adult) (pediatric): Secondary | ICD-10-CM

## 2019-12-21 DIAGNOSIS — E785 Hyperlipidemia, unspecified: Secondary | ICD-10-CM

## 2019-12-21 DIAGNOSIS — I493 Ventricular premature depolarization: Secondary | ICD-10-CM | POA: Diagnosis not present

## 2019-12-21 MED ORDER — METOPROLOL SUCCINATE ER 50 MG PO TB24: 50.0000 mg | ORAL_TABLET | Freq: Every day | ORAL | 3 refills | Status: DC

## 2019-12-21 NOTE — Patient Instructions (Signed)
Medication Instructions:  INCREASE YOUR METOPROLOL TO 50MG  DAILY  *If you need a refill on your cardiac medications before your next appointment, please call your pharmacy*     Follow-Up: At [facility], you and your health needs are our priority.  As part of our continuing mission to provide you with exceptional heart care, we have created designated Provider Care Teams.  These Care Teams include your primary Cardiologist (physician) and Advanced Practice Providers (APPs -  Physician Assistants and Nurse Practitioners) who all work together to provide you with the care you need, when you need it.  We recommend signing up for the patient portal called "MyChart".  Sign up information is provided on this After Visit Summary.  MyChart is used to connect with patients for Virtual Visits (Telemedicine).  Patients are able to view lab/test results, encounter notes, upcoming appointments, etc.  Non-urgent messages can be sent to your provider as well.   To learn more about what you can do with MyChart, go to [email or web address].    Your next appointment:   3 month(s)  The format for your next appointment:   In Person  Provider:   Shelva Majestic, MD   Other Instructions NOTIFY OUR OFFICE IF YOU WOULD LIKE THE REFERRAL FOR THE ORAL APPLIANCE OR FOR THE CPAP

## 2019-12-23 NOTE — Progress Notes (Signed)
Cardiology Office Note    Date:  12/23/2019   ID:  GENAVIVE KUBICKI, DOB 11/15/1945, MRN [id number]  PCP:  Shirline Frees, MD  Cardiologist:  Shelva Majestic, MD   Cardiology/Sleep evaluation; former patient of Dr. Tollie Eth.  History of Present Illness:  Laura [name] is a 74 y.o. female who is the wife of my patient Mr. Laverne Hursey.  I saw her for initial cardiology evaluation on September 21, 2018 saw for follow-up on March 15, 2019.  She presents now to sleep clinic for evaluation following CPAP therapy.    Ms. Ritisha Deitrick is a remote patient of Dr. Sallyanne Kuster.  Apparently, she has a history of left bundle branch block, and apparently was felt to have a Takotsubo cardiomyopathy after she presented with a non-ST segment delegation MI with a peak troponin of 5.33.  She underwent cardiac catheterization by Dr. Sallyanne Kuster on December 09, 2009 and was found to have normal coronary arteries and classic findings of Takotsubo cardiomyopathy with apical ballooning of the left ventricle.  Her EF by echo was 30 to 35%.  At that time she was started on lisinopril, Lasix and low-dose metoprolol.  She also has a history of asthma and bronchitis.  Over the past several years she has been followed by Dr. Tollie Eth for cardiology care.  She has a history of hypertension and has been on irbesartan 300 mg, hydralazine 50 mg twice a day, metoprolol succinate 12.5 mg daily.  She has a history of hypothyroidism and has been on levothyroxine 88 mcg.  There also was a history of some anxiety for which she had been given diazepam as well as citalopram.  I initially saw her in March 2020. Upon further questioning, the patient has a history of severe sleep apnea and states that she is had 3 sleep studies with her last being many years ago by Dr. Annamaria Boots.  She reports an AHI initially of 66.  She had been on CPAP therapy remotely remembers a pressure of 14 cm but then became not tolerant to CPAP mask.  As result she  later obtained an oral appliance by Dr. Oneal Grout.  However she has had difficulty with some teeth fractures and and has not used her oral appliance in well over a year.  She admits to snoring.  Her sleep is nonrestorative.  She was evaluated by Dr. Christinia Gully  week for cough bronchitis/asthma.  She was started on [facility] for asthma.  She was also recommended to take both pantoprazole and famotidine for GERD and its effect on her cough.    When I initially saw her, I spent considerable time with her discussing her untreated sleep apnea and the significant improvements in mask and CPAP technology since her initial trial.  I recommended she undergo a follow-up echo Doppler study.  She was on atorvastatin for hyperlipidemia.  Due to the COVID pandemic, she never underwent a sleep study and upon further questioning today does not want to have one as long as she would have to have a COVID test.  She underwent an echo Doppler study on December 09, 2018 which revealed an EF of 45% with septal lateral dyssynchrony consistent with left bundle branch block.  There was mild aortic sclerosis without stenosis.  She was seen by Doreene Adas on January 31, 2019 and at that time was having palpitations.  She admitted to being under significant recent stress.  There was some discussion concerning EP evaluation of her PVCs but  she declined.   She underwent a home sleep study on May 03, 2019 which showed moderate overall sleep apnea with an AHI of 26.9/h but since it was a home study the severity of sleep apnea during REM sleep was unable to be assessed.  Of note was that she had severe oxygen desaturation to a nadir of 74%.  She snored 32.3% during sleep.  She was referred to undergo a CPAP titration study which was done on August 10, 2019.  Pressure was titrated to 11 cm with excellent result with O2 saturation 94%.  Apparently, she received a new CPAP machine with set up date September 25, 2019.  [employer] was her DME company.   Apparently, the patient never used the machine until May 25.  She had been contacted by the DME company of the importance of meeting compliance and that she will need to meet compliance by the 90-day window.  If she had continued use consistently over the past month she would have met compliance but unfortunately usage was only 17 of 30 days.  At 11 cm, pressure was still elevated at 13.2.  Her machine was picked up by her DME company and she no longer has therapy.  She continues to notice occasional palpitations.  She denies presyncope or syncope.  She denies chest pain.  She presents for evaluation.  Past Medical History:  Diagnosis Date  . Anemia   . Anxiety   . Asthma    related to sesonal allergies  . Chronic combined systolic and diastolic CHF, NYHA class 2 ([place]) CARDIOLOGIST-  DR BPZWCHEN  . Depression   . History of kidney stones   . History of non-ST elevation myocardial infarction (NSTEMI) JUNE 2011   SECONDARY TO TAKOTSUDO SYNDROME (CARDIAC CATH NORMAL)  . Hyperlipemia   . Hypertension   . Hypoglycemia   . Hypothyroidism   . LBBB (left bundle branch block)   . Left ventricular ejection fraction less than 40%    38% PER CARDIOLOGIST NOTE (DR CROITORU)  . Memory loss   . Mood swings   . Nonischemic dilated cardiomyopathy ([place])    MODERATELY DEPRESSED LVF;EF 35-45% by Echo 05/27/11  . OSA (obstructive sleep apnea) MODERATE PER STUDY 2005   CPAP NONCOMPLIANT  . Seasonal allergies   . SUI (stress urinary incontinence, female)     Past Surgical History:  Procedure Laterality Date  . ABDOMINAL HYSTERECTOMY  1985   partial  . CARDIAC CATHETERIZATION  09-04-1999;  08/25/2004;   12/09/2009  DR CROITORU   NORMAL CORONARIES/  APICAL BALLOONING OF LV CONSISTENT WITH TAKOTSUBO SYMPTOMS/ EF 30-35%  . CATARACT EXTRACTION W/ INTRAOCULAR LENS  IMPLANT, BILATERAL    . CHOLECYSTECTOMY N/A 09/29/2012   Procedure: LAPAROSCOPIC CHOLECYSTECTOMY WITH INTRAOPERATIVE CHOLANGIOGRAM;  Surgeon: Adin Hector, MD;  Location: [facility];  Service: General;  Laterality: N/A;  . CYSTOSCOPY N/A 09/19/2012   Procedure: Erlene Quan;  Surgeon: Bernestine Amass, MD;  Location: [facility];  Service: Urology;  Laterality: N/A;  . KNEE ARTHROSCOPY W/ MENISCECTOMY  07-27-2011   MEDIAL AND LATERAL  . NASAL SEPTUM SURGERY  1980's  . PUBOVAGINAL SLING N/A 09/19/2012   Procedure: SUBURETHRAL Janyth Pupa;  Surgeon: Bernestine Amass, MD;  Location[facility];  Service: Urology;  Laterality: N/A;  . RIGHT URETEROSCOPIC STONE EXTRACTION  08-31-2000  . TRANSTHORACIC ECHOCARDIOGRAM  05-27-2011  DR CROITORU   MODERATELY DEPRESSED LVF DUE TO GLOBAL HYPOKINESIS AND MARKED SYSTOLIC ASYNCHRONY/ EF 27%/ MILD LEFT  ATRIAL DILATATION    Current Medications: Outpatient Medications Prior to Visit  Medication Sig Dispense Refill  . atorvastatin (LIPITOR) 80 MG tablet TAKE 1/2 TABLET BY MOUTH DAILY FOR CHOLESTEROL 45 tablet 3  . diazepam (VALIUM) 10 MG tablet Take 10 mg by mouth every 12 (twelve) hours as needed for anxiety.    . hydrALAZINE (APRESOLINE) 50 MG tablet Take 1 tablet (50 mg total) by mouth daily. 60 tablet 1  . irbesartan (AVAPRO) 300 MG tablet Take 300 mg by mouth daily.    Marland Kitchen levothyroxine (SYNTHROID, LEVOTHROID) 88 MCG tablet Take 88 mcg by mouth daily before breakfast.    . NUVIGIL 250 MG tablet Take 250 mg by mouth as needed.  3  . metoprolol succinate (TOPROL-XL) 25 MG 24 hr tablet Take 1.5 tablets (37.5 mg total) by mouth daily. 135 tablet 1  . spironolactone (ALDACTONE) 25 MG tablet Take 0.5 tablets (12.5 mg total) by mouth daily. 30 tablet 6   No facility-administered medications prior to visit.     Allergies:   Codeine, Ace inhibitors, Citalopram, Fetzima [levomilnacipran], Lasix [furosemide], Nsaids, and Xanax xr [alprazolam er]   Social History   Socioeconomic History  . Marital status: Married    Spouse name: Richard  . Number of children: 2  . Years of  education: 12+  . Highest education level: Some college, no degree  Occupational History  . Occupation: Retired  Tobacco Use  . Smoking status: Never Smoker  . Smokeless tobacco: Never Used  Substance and Sexual Activity  . Alcohol use: No    Alcohol/week: 0.0 standard drinks  . Drug use: No  . Sexual activity: Never    Birth control/protection: Post-menopausal  Other Topics Concern  . Not on file  Social History Narrative   Lives at home with husband.   Right-handed.   Drinks 2-3 cups caffeine per day.   No regular exercise.   Social Determinants of Health   Financial Resource Strain:   . Difficulty of Paying Living Expenses:   Food Insecurity:   . Worried About Charity fundraiser in the Last Year:   . Arboriculturist in the Last Year:   Transportation Needs:   . Film/video editor (Medical):   Marland Kitchen Lack of Transportation (Non-Medical):   Physical Activity:   . Days of Exercise per Week:   . Minutes of Exercise per Session:   Stress:   . Feeling of Stress :   Social Connections:   . Frequency of Communication with Friends and Family:   . Frequency of Social Gatherings with Friends and Family:   . Attends Religious Services:   . Active Member of Clubs or Organizations:   . Attends Archivist Meetings:   Marland Kitchen Marital Status:      She is married for 55 years to my patient Mr. Lashauna Arpin.  She has 2 children and 1 grandchild.  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  She completed 12th grade of education.  She does not exercise.  She does not drink alcohol.  Family History:  The patient's family history includes Fibromyalgia in her brother; Heart attack in her father and paternal grandfather; Heart disease in her brother; Hypertension in her brother and mother; Pneumonia in her mother; Pulmonary embolism in her brother.   Her mother died at age 18 secondary to anaphylaxis.  Her father died at age 37 with a heart attack.   A brother died at  age 55 with heart problems.  She has a living sister age 28.  ROS General: Negative; No fevers, chills, or night sweats;  HEENT: Negative; No changes in vision or hearing, sinus congestion, difficulty swallowing Pulmonary: Positive for cough, asthma and bronchitis Cardiovascular: No chest pain, positive for palpitations GI: Negative; No nausea, vomiting, diarrhea, or abdominal pain GU: Negative; No dysuria, hematuria, or difficulty voiding Musculoskeletal: Negative; no myalgias, joint pain, or weakness Hematologic/Oncology: Negative; no easy bruising, bleeding Endocrine: Negative; no heat/cold intolerance; no diabetes Neuro: Negative; no changes in balance, headaches Skin: Negative; No rashes or skin lesions Psychiatric: Negative; No behavioral problems, depression Sleep: History of OSA, previously documented to be severe.  Remotely used CPAP and remotely had a customized oral appliance.  Currently snores, nocturia 2 times per night, nonrestorative sleep, and she experiences daytime sleepiness.  norestless legs, hypnogognic hallucinations, no cataplexy; despite receiving a new machine on September 25, 2019 she never started to use therapy until Nov 21, 2019 and unfortunately due to failure to meet compliance her machine was picked up by the DME company. Other comprehensive 14 point system review is negative.   PHYSICAL EXAM:   VS:  BP 130/74   Pulse 78   Temp 98 F (36.7 C)   Ht 5' 3" (1.6 m)   Wt 174 lb 3.2 oz (79 kg)   SpO2 97%   BMI 30.86 kg/m     Repeat blood pressure by me was 132/7010  Wt Readings from Last 3 Encounters:  12/21/19 174 lb 3.2 oz (79 kg)  08/10/19 124 lb (56.2 kg)  05/03/19 180 lb (81.6 kg)    General: Alert, oriented, no distress.  Skin: normal turgor, no rashes, warm and dry HEENT: Normocephalic, atraumatic. Pupils equal round and reactive to light; sclera anicteric; extraocular muscles intact;  Nose without nasal septal  hypertrophy Mouth/Parynx benign; Mallinpatti scale 3 Neck: No JVD, no carotid bruits; normal carotid upstroke Lungs: clear to ausculatation and percussion; no wheezing or rales Chest wall: without tenderness to palpitation Heart: PMI not displaced, RRR, s1 s2 normal, 1/6 systolic murmur, no diastolic murmur, no rubs, gallops, thrills, or heaves Abdomen: soft, nontender; no hepatosplenomehaly, BS+; abdominal aorta nontender and not dilated by palpation. Back: no CVA tenderness Pulses 2+ Musculoskeletal: full range of motion, normal strength, no joint deformities Extremities: no clubbing cyanosis or edema, Homan's sign negative  Neurologic: grossly nonfocal; Cranial nerves grossly wnl Psychologic: Normal mood and affect    Studies/Labs Reviewed:   EKG:  EKG is ordered today.  ECG (independently read by me): Sinus rhythm at 78 bpm with frequent PVCs.  Left bundle branch block with repolarization changes.  QTc interval 487 ms.  March 15, 2019 ECG (independently read by me): Sinus rhythm at 68 bpm, PVC, left bundle branch block with repolarization changes.  September 21, 2018 ECG (independently read by me): Sinus rhythm at 73 bpm with PACs, left bundle branch block with repolarization changes.  Recent Labs: BMP Latest Ref Rng & Units 12/19/2018 12/09/2018 09/19/2018  Glucose 65 - 99 mg/dL 89 95 91  BUN 8 - 27 mg/dL _0 Creatinine 0.57 - 1.00 mg/dL 0.83 0.91 0.90  BUN/Creat Ratio 12 - _1 -  Sodium 134 - 144 mmol/L 143 144 141  Potassium 3.5 - 5.2 mmol/L 4.1 4.5 3.6  Chloride 96 - 106 mmol/L 105 106 106  CO2 20 - 29 mmol/L _2 Calcium 8.7 - 10.3 mg/dL 9.4 9.5 9.9  Hepatic Function Latest Ref Rng & Units 08/03/2017 10/25/2015 12/19/2014  Total Protein 6.5 - 8.1 g/dL 7.6 7.0 6.7  Albumin 3.5 - 5.0 g/dL 4.2 4.1 3.7  AST 15 - 41 U/L _0 ALT 14 - 54 U/L _1 Alk Phosphatase 38 - 126 U/L 81 78 69  Total Bilirubin 0.3 - 1.2 mg/dL 0.6 0.3 0.6  Bilirubin, Direct  0.0 - 0.3 mg/dL - - -    CBC Latest Ref Rng & Units 09/19/2018 08/03/2017 08/03/2017  WBC 4.0 - 10.5 K/uL 12.6(H) - 8.0  Hemoglobin 12.0 - 15.0 g/dL 13.0 13.9 13.1  Hematocrit 36 - 46 % 39.0 41.0 41.0  Platelets 150 - 400 K/uL 262.0 - 264   Lab Results  Component Value Date   MCV 74.9 (L) 09/19/2018   MCV 78.5 08/03/2017   MCV 79.7 10/25/2015   Lab Results  Component Value Date   TSH 1.02 09/19/2018   Lab Results  Component Value Date   HGBA1C 6.0 (H) 12/19/2018     BNP    Component Value Date/Time   BNP 21.7 05/15/2015 1339    ProBNP    Component Value Date/Time   PROBNP 65.0 09/19/2018 1532     Lipid Panel     Component Value Date/Time   CHOL 158 12/19/2018 1358   TRIG 113 12/19/2018 1358   HDL 57 12/19/2018 1358   CHOLHDL 2.8 12/19/2018 1358   CHOLHDL 5.1 08/23/2014 1623   VLDL 75 (H) 08/23/2014 1623   LDLCALC 78 12/19/2018 1358     RADIOLOGY: No results found.   Additional studies/ records that were reviewed today include:  I have reviewed the records of Dr. Viona Gilmore. Tollie Eth.   ECHO Doppler study was in August 2018 which showed moderate concentric LVH with abnormal septal wall motion due to left bundle branch block.  EF 50%.  Moderate LA dilation.  Mild to moderate MR.  Carotid duplex imaging from April 01, 2017: 1 to 39% right and left proximal internal carotid stenoses  Lexiscan Myoview studies March 25, 2017: Normal perfusion with EF at 43% with abnormal septal motion consistent with left bundle branch block.  The visual EF appeared higher.  It was felt that a low EF was affected by the bundle branch block and  gating artifact due to PVCs.  ECHO 12/09/2018 IMPRESSIONS  1. The left ventricle has a visually estimated ejection fraction of 45%.  The cavity size was normal. There is moderately increased left ventricular  wall thickness. Left ventricular diastolic Doppler parameters are  consistent with impaired relaxation.  Left ventrical  global hypokinesis without regional wall motion  abnormalities. Septal-lateral dyssynchrony consistent with LBBB.  2. The right ventricle has normal systolic function. The cavity was  normal. There is no increase in right ventricular wall thickness.  3. Left atrial size was mildly dilated.  4. No evidence of mitral valve stenosis. Trivial mitral regurgitation.  5. The aortic valve is tricuspid. Mild calcification of the aortic valve.  No stenosis of the aortic valve.  6. The aortic root is normal in size and structure.  7. The IVC was normal in size. No complete TR doppler jet so unable to  estimate PA systolic pressure.    LONG TERM MONITOR 01/19/2020 The patient was monitored from June 28 through January 08, 2019 for 13 days and 12 hours.  The predominant rhythm was sinus rhythm with an average heart rate of 71 bpm.  There was bundle branch block/I  VCD.  There were frequent isolated PVCs at 9.5%, occasional couplet at 1.5% and rare triplets less than 1%.   There were rare episodes of ventricular bigeminy and trigeminy.  There was 1 run of irregular ventricular tachycardia lasting 4 beats with an average rate of 128 with a maximum rate at 174.  There were 2 supraventricular tachycardic episodes with the fastest interval 14 beats (average rate 120    ASSESSMENT:    1. Obstructive sleep apnea   2. Essential hypertension   3. Frequent PVCs   4. Nonischemic cardiomyopathy ([place])   5. Hyperlipidemia with target LDL less than 70   6. Daytime sleepiness     PLAN:  Ms. Kayda Allers is a 74 year old female who suffered a non-ST segment elevation myocardial infarction at which time she was found to have findings consistent with Takotsubo cardiomyopathy.  EF at that time was 30 to 35%.  Coronary angiography revealed normal coronary arteries.  Her cardiomyopathy was nonobstructive.  She was treated with initial medical therapy including ACE inhibition, low-dose beta-blocker therapy as well as  diuretic.  Subsequently, she was followed  by Dr. Tollie Eth and on last echo Doppler assessment EF was approximately 50%.  She has chronic left bundle branch block.  She has a history of hypertension and  has been managed with hydralazine 50 mg daily, irbesartan 300 mg, Toprol XL 25 mg in addition to spironolactone 12.5 mg daily.  With her palpitations, she was advised to increase her Toprol to 25 mg twice a day which she has not done. When she was last seen she had agreed to increase this to at least 37.5 mg daily. An echo Doppler study from June 2020  revealed an EF of approximately 45% and findings consistent with her left bundle branch block with septal dyssynergy and grade 1 diastolic dysfunction. At her last visit I had a long discussion with her concerning untreated sleep apnea which may be contributing to some of her palpitations, blood pressure elevation and discussed potential additional adverse cardiovascular consequences. Her home study revealed at least moderate overall sleep apnea but my suspicion is that this was much more severe during REM sleep which could not be assessed on the home study. She had significant oxygen desaturation to a nadir of 74%. Despite having a set up date of September 25, 2019 unfortunately she never initiated therapy until late May. If she had continued therapy consistently with initiation she would've met compliance standards but unfortunately her machine was picked up on December 19, 2019 by her DME company. I suspect she needed additional pressure adjustment but unfortunately she never gave me the opportunity to make adjustments to her equipment prior to the device retrieval. I spent 45 to 50 minutes with her today in the office discussing many factors. She is even considering the possibility of buying a machine herself on the Internet and we discussed [email or web address] as well as [place] and the equipment that would be necessary. She had previously failed an attempt at using a  customized oral appliance and had been treated by Dr. Oneal Grout. Her EKG today continues to show PVCs occurring fairly frequently. I have recommended further titration of metoprolol succinate to 50 mg daily. She continues to have daytime sleepiness and takes new vigil 250 mg as needed. Her blood pressure today is relatively stable on hydralazine 50 mg daily, irbesartan 300 mg, spironolactone 12.5 mg in addition to her metoprolol. She has been having cramps in her legs for which she self prescribed  mustard which she states has been beneficial. Her Zio patch monitor from July 2020 showed frequent PVCs representing 9.5% with an occasional couplet and she had 1 run of an irregular ventricular tachycardia lasting 4 beats at a rate of 128 with a maximum rate of 174. I will see her in 3 months for follow-up evaluation or sooner as necessary.   Time spent: 50 minutes   Medication Adjustments/Labs and Tests Ordered: Current medicines are reviewed at length with the patient today.  Concerns regarding medicines are outlined above.  Medication changes, Labs and Tests ordered today are listed in the Patient Instructions below. Patient Instructions  Medication Instructions:  INCREASE YOUR METOPROLOL TO 50MG DAILY  *If you need a refill on your cardiac medications before your next appointment, please call your pharmacy*     Follow-Up: At [facility], you and your health needs are our priority.  As part of our continuing mission to provide you with exceptional heart care, we have created designated Provider Care Teams.  These Care Teams include your primary Cardiologist (physician) and Advanced Practice Providers (APPs -  Physician Assistants and Nurse Practitioners) who all work together to provide you with the care you need, when you need it.  We recommend signing up for the patient portal called "MyChart".  Sign up information is provided on this After Visit Summary.  MyChart is used to connect with  patients for Virtual Visits (Telemedicine).  Patients are able to view lab/test results, encounter notes, upcoming appointments, etc.  Non-urgent messages can be sent to your provider as well.   To learn more about what you can do with MyChart, go to [email or web address].    Your next appointment:   3 month(s)  The format for your next appointment:   In Person  Provider:   Shelva Majestic, MD   Other Instructions NOTIFY [place] CPAP    Signed, Shelva Majestic, MD  12/23/2019 3:59 PM    [facility] [address], [place], [place], [place]  [address] Phone: [phone]

## 2020-01-09 DIAGNOSIS — R32 Unspecified urinary incontinence: Secondary | ICD-10-CM | POA: Diagnosis not present

## 2020-01-09 DIAGNOSIS — Z6832 Body mass index (BMI) 32.0-32.9, adult: Secondary | ICD-10-CM | POA: Diagnosis not present

## 2020-01-09 DIAGNOSIS — Z124 Encounter for screening for malignant neoplasm of cervix: Secondary | ICD-10-CM | POA: Diagnosis not present

## 2020-01-09 DIAGNOSIS — Z1231 Encounter for screening mammogram for malignant neoplasm of breast: Secondary | ICD-10-CM | POA: Diagnosis not present

## 2020-01-09 DIAGNOSIS — M810 Age-related osteoporosis without current pathological fracture: Secondary | ICD-10-CM | POA: Diagnosis not present

## 2020-01-09 DIAGNOSIS — Z9181 History of falling: Secondary | ICD-10-CM | POA: Diagnosis not present

## 2020-01-16 DIAGNOSIS — F411 Generalized anxiety disorder: Secondary | ICD-10-CM | POA: Diagnosis not present

## 2020-01-16 DIAGNOSIS — F329 Major depressive disorder, single episode, unspecified: Secondary | ICD-10-CM | POA: Diagnosis not present

## 2020-01-16 DIAGNOSIS — E7800 Pure hypercholesterolemia, unspecified: Secondary | ICD-10-CM | POA: Diagnosis not present

## 2020-01-16 DIAGNOSIS — N1831 Chronic kidney disease, stage 3a: Secondary | ICD-10-CM | POA: Diagnosis not present

## 2020-01-16 DIAGNOSIS — E039 Hypothyroidism, unspecified: Secondary | ICD-10-CM | POA: Diagnosis not present

## 2020-01-16 DIAGNOSIS — F331 Major depressive disorder, recurrent, moderate: Secondary | ICD-10-CM | POA: Diagnosis not present

## 2020-01-16 DIAGNOSIS — I10 Essential (primary) hypertension: Secondary | ICD-10-CM | POA: Diagnosis not present

## 2020-01-16 DIAGNOSIS — F5101 Primary insomnia: Secondary | ICD-10-CM | POA: Diagnosis not present

## 2020-01-16 DIAGNOSIS — I255 Ischemic cardiomyopathy: Secondary | ICD-10-CM | POA: Diagnosis not present

## 2020-01-27 ENCOUNTER — Other Ambulatory Visit: Payer: Self-pay | Admitting: Cardiovascular Disease

## 2020-02-12 DIAGNOSIS — F411 Generalized anxiety disorder: Secondary | ICD-10-CM | POA: Diagnosis not present

## 2020-02-12 DIAGNOSIS — F332 Major depressive disorder, recurrent severe without psychotic features: Secondary | ICD-10-CM | POA: Diagnosis not present

## 2020-02-12 DIAGNOSIS — F4310 Post-traumatic stress disorder, unspecified: Secondary | ICD-10-CM | POA: Diagnosis not present

## 2020-02-27 ENCOUNTER — Telehealth: Payer: Self-pay | Admitting: *Deleted

## 2020-02-27 NOTE — Telephone Encounter (Signed)
Returned a call to patient. She informed that she has appointment with Dr Redmond Baseman on September 28th to discuss possibly getting a Inspire sleep device. She wanted to know since she is a "heart patient" would this be okay. I told her that she has a appointment on September 24th to see Dr Claiborne Billings. She can discuss this with him at this appointment. Patient didn't realize that she was seeing him first. She had her days mixed up. She was encouraged to keep her September appointment with Dr Claiborne Billings so that he can answer all of her questions prior to seeing Dr Redmond Baseman.

## 2020-03-22 ENCOUNTER — Encounter: Payer: Self-pay | Admitting: Cardiovascular Disease

## 2020-03-22 ENCOUNTER — Other Ambulatory Visit: Payer: Self-pay

## 2020-03-22 ENCOUNTER — Ambulatory Visit (INDEPENDENT_AMBULATORY_CARE_PROVIDER_SITE_OTHER): Payer: Medicare Other | Admitting: Cardiovascular Disease

## 2020-03-22 DIAGNOSIS — E785 Hyperlipidemia, unspecified: Secondary | ICD-10-CM

## 2020-03-22 DIAGNOSIS — I428 Other cardiomyopathies: Secondary | ICD-10-CM

## 2020-03-22 DIAGNOSIS — I447 Left bundle-branch block, unspecified: Secondary | ICD-10-CM

## 2020-03-22 DIAGNOSIS — I5181 Takotsubo syndrome: Secondary | ICD-10-CM

## 2020-03-22 DIAGNOSIS — I10 Essential (primary) hypertension: Secondary | ICD-10-CM

## 2020-03-22 DIAGNOSIS — G4733 Obstructive sleep apnea (adult) (pediatric): Secondary | ICD-10-CM

## 2020-03-22 DIAGNOSIS — R400 Somnolence: Secondary | ICD-10-CM | POA: Diagnosis not present

## 2020-03-22 DIAGNOSIS — I493 Ventricular premature depolarization: Secondary | ICD-10-CM | POA: Diagnosis not present

## 2020-03-22 NOTE — Progress Notes (Signed)
Cardiology Office Note    Date:  03/22/2020   ID:  Laura [name], DOB 01/20/1946, MRN [id number]  PCP:  Shirline Frees, MD  Cardiologist:  Shelva Majestic, MD   Cardiology/Sleep evaluation; former patient of Dr. Tollie Eth.  History of Present Illness:  Laura [name] is a 74 y.o. female who is the wife of my patient Laura [name].  I saw her for initial cardiology evaluation on September 21, 2018 saw for follow-up on March 15, 2019 and last saw her in December 21, 2019 in a sleep clinic for evaluation following CPAP therapy.    Laura [name] is a remote patient of Dr. Sallyanne Kuster.  Apparently, she has a history of left bundle branch block, and apparently was felt to have a Takotsubo cardiomyopathy after she presented with a non-ST segment delegation MI with a peak troponin of 5.33.  She underwent cardiac catheterization by Dr. Sallyanne Kuster on December 09, 2009 and was found to have normal coronary arteries and classic findings of Takotsubo cardiomyopathy with apical ballooning of the left ventricle.  Her EF by echo was 30 to 35%.  At that time she was started on lisinopril, Lasix and low-dose metoprolol.  She also has a history of asthma and bronchitis.  Over the past several years she has been followed by Dr. Tollie Eth for cardiology care.  She has a history of hypertension and has been on irbesartan 300 mg, hydralazine 50 mg twice a day, metoprolol succinate 12.5 mg daily.  She has a history of hypothyroidism and has been on levothyroxine 88 mcg.  There also was a history of some anxiety for which she had been given diazepam as well as citalopram.  I initially saw her in March 2020. Upon further questioning, the patient has a history of severe sleep apnea and states that she is had 3 sleep studies with her last being many years ago by Dr. Annamaria Boots.  She reports an AHI initially of 66.  She had been on CPAP therapy remotely remembers a pressure of 14 cm but then became not tolerant to CPAP mask.   As result she later obtained an oral appliance by Dr. Oneal Grout.  However she has had difficulty with some teeth fractures and and has not used her oral appliance in well over a year.  She admits to snoring.  Her sleep is nonrestorative.  She was evaluated by Dr. Christinia Gully  week for cough bronchitis/asthma.  She was started on [employer] for asthma.  She was also recommended to take both pantoprazole and famotidine for GERD and its effect on her cough.    When I initially saw her, I spent considerable time with her discussing her untreated sleep apnea and the significant improvements in mask and CPAP technology since her initial trial.  I recommended she undergo a follow-up echo Doppler study.  She was on atorvastatin for hyperlipidemia.  Due to the COVID pandemic, she never underwent a sleep study and upon further questioning today does not want to have one as long as she would have to have a COVID test.  She underwent an echo Doppler study on December 09, 2018 which revealed an EF of 45% with septal lateral dyssynchrony consistent with left bundle branch block.  There was mild aortic sclerosis without stenosis.  She was seen by Doreene Adas on January 31, 2019 and at that time was having palpitations.  She admitted to being under significant recent stress.  There was some discussion concerning EP  evaluation of her PVCs but she declined.   She underwent a home sleep study on May 03, 2019 which showed moderate overall sleep apnea with an AHI of 26.9/h but since it was a home study the severity of sleep apnea during REM sleep was unable to be assessed.  Of note was that she had severe oxygen desaturation to a nadir of 74%.  She snored 32.3% during sleep.  She was referred to undergo a CPAP titration study which was done on August 10, 2019.  Pressure was titrated to 11 cm with excellent result with O2 saturation 94%.  Apparently, she received a new CPAP machine with set up date September 25, 2019.  [employer] was her DME  company.  Apparently, the patient never used the machine until May 25.  She had been contacted by the DME company of the importance of meeting compliance and that she will need to meet compliance by the 90-day window.  If she had continued use consistently over the past month she would have met compliance but unfortunately usage was only 17 of 30 days.  At 11 cm, pressure was still elevated at 13.2.  Her machine was picked up by her DME company and she no longer has therapy.  She continues to notice occasional palpitations.  She denies chest pain or shortness of breath.  At her last evaluation with me in June 2021, I spent a significant amount of time with her.  She was considering the possibility of buying a CPAP machine on the Internet since her machine had been returned due to poor compliance.  In the past she had failed because demised oral appliance and had been treated by Dr. Ron Parker.  Her EKG showed some PVCs occurring frequently and I recommended further titration of metoprolol succinate to 50 mg daily.  She continues to have daytime sleepiness and was taken Nuvigil on a as needed basis.  Since her last evaluation, she has continued to have poor sleep.  She admits to continued daytime sleepiness.  She has done some research on her own and would like to be evaluated for inspire technology.  She has an appointment to see Dr. Redmond Baseman next week to see if she qualifies.  She continues to experience some shortness of breath but this seems to occur when she does not eat and seems to improve when she does eat.  She is not very active.  Her last echo Doppler study in February 2020 showed an EF of 45% with septal lateral dyssynchrony consistent with her left bundle branch block.  She presents for follow-up evaluation.  Past Medical History:  Diagnosis Date  . Anemia   . Anxiety   . Asthma    related to sesonal allergies  . Chronic combined systolic and diastolic CHF, NYHA class 2 ([place]) CARDIOLOGIST-  DR FXOVANVB   . Depression   . History of kidney stones   . History of non-ST elevation myocardial infarction (NSTEMI) JUNE 2011   SECONDARY TO TAKOTSUDO SYNDROME (CARDIAC CATH NORMAL)  . Hyperlipemia   . Hypertension   . Hypoglycemia   . Hypothyroidism   . LBBB (left bundle branch block)   . Left ventricular ejection fraction less than 40%    38% PER CARDIOLOGIST NOTE (DR CROITORU)  . Memory loss   . Mood swings   . Nonischemic dilated cardiomyopathy ([place])    MODERATELY DEPRESSED LVF;EF 35-45% by Echo 05/27/11  . OSA (obstructive sleep apnea) MODERATE PER STUDY 2005   CPAP NONCOMPLIANT  .  Seasonal allergies   . SUI (stress urinary incontinence, female)     Past Surgical History:  Procedure Laterality Date  . ABDOMINAL HYSTERECTOMY  1985   partial  . CARDIAC CATHETERIZATION  09-04-1999;  08/25/2004;   12/09/2009  DR CROITORU   NORMAL CORONARIES/  APICAL BALLOONING OF LV CONSISTENT WITH TAKOTSUBO SYMPTOMS/ EF 30-35%  . CATARACT EXTRACTION W/ INTRAOCULAR LENS  IMPLANT, BILATERAL    . CHOLECYSTECTOMY N/A 09/29/2012   Procedure: LAPAROSCOPIC CHOLECYSTECTOMY WITH INTRAOPERATIVE CHOLANGIOGRAM;  Surgeon: Adin Hector, MD;  Location: [place];  Service: General;  Laterality: N/A;  . CYSTOSCOPY N/A 09/19/2012   Procedure: Erlene Quan;  Surgeon: Bernestine Amass, MD;  Location: [facility];  Service: Urology;  Laterality: N/A;  . KNEE ARTHROSCOPY W/ MENISCECTOMY  07-27-2011   MEDIAL AND LATERAL  . NASAL SEPTUM SURGERY  1980's  . PUBOVAGINAL SLING N/A 09/19/2012   Procedure: SUBURETHRAL Janyth Pupa;  Surgeon: Bernestine Amass, MD;  Location: [facility];  Service: Urology;  Laterality: N/A;  . RIGHT URETEROSCOPIC STONE EXTRACTION  08-31-2000  . TRANSTHORACIC ECHOCARDIOGRAM  05-27-2011  DR CROITORU   MODERATELY DEPRESSED LVF DUE TO GLOBAL HYPOKINESIS AND MARKED SYSTOLIC ASYNCHRONY/ EF 22%/ MILD LEFT ATRIAL DILATATION    Current Medications: Outpatient Medications Prior  to Visit  Medication Sig Dispense Refill  . atorvastatin (LIPITOR) 80 MG tablet TAKE 1/2 TABLET BY MOUTH DAILY FOR CHOLESTEROL 45 tablet 3  . buPROPion HCl (WELLBUTRIN PO) Take 50 mg by mouth. 1 tablet Daily    . diazepam (VALIUM) 10 MG tablet Take 10 mg by mouth every 12 (twelve) hours as needed for anxiety.    Marland Kitchen ESZOPICLONE 3 MG tablet Take 1.5-3 mg by mouth at bedtime as needed.    . hydrALAZINE (APRESOLINE) 50 MG tablet Take 1 tablet (50 mg total) by mouth daily. 60 tablet 1  . irbesartan (AVAPRO) 300 MG tablet Take 300 mg by mouth daily.    Marland Kitchen levothyroxine (SYNTHROID, LEVOTHROID) 88 MCG tablet Take 88 mcg by mouth daily before breakfast.    . metoprolol succinate (TOPROL-XL) 50 MG 24 hr tablet Take 1 tablet (50 mg total) by mouth daily. 90 tablet 3  . NUVIGIL 250 MG tablet Take 250 mg by mouth as needed.  3  . spironolactone (ALDACTONE) 25 MG tablet TAKE 1/2 TABLET BY MOUTH DAILY 45 tablet 3  . WELLBUTRIN XL 150 MG 24 hr tablet Take by mouth.    . Levothyroxine Sodium 88 MCG CAPS      No facility-administered medications prior to visit.     Allergies:   Codeine, Ace inhibitors, Citalopram, Fetzima [levomilnacipran], Lasix [furosemide], Nsaids, and Xanax xr [alprazolam er]   Social History   Socioeconomic History  . Marital status: Married    Spouse name: Richard  . Number of children: 2  . Years of education: 12+  . Highest education level: Some college, no degree  Occupational History  . Occupation: Retired  Tobacco Use  . Smoking status: Never Smoker  . Smokeless tobacco: Never Used  Substance and Sexual Activity  . Alcohol use: No    Alcohol/week: 0.0 standard drinks  . Drug use: No  . Sexual activity: Never    Birth control/protection: Post-menopausal  Other Topics Concern  . Not on file  Social History Narrative   Lives at home with husband.   Right-handed.   Drinks 2-3 cups caffeine per day.   No regular exercise.   Social Determinants of Health  Financial  Resource Strain:   . Difficulty of Paying Living Expenses: Not on file  Food Insecurity:   . Worried About Charity fundraiser in the Last Year: Not on file  . Ran Out of Food in the Last Year: Not on file  Transportation Needs:   . Lack of Transportation (Medical): Not on file  . Lack of Transportation (Non-Medical): Not on file  Physical Activity:   . Days of Exercise per Week: Not on file  . Minutes of Exercise per Session: Not on file  Stress:   . Feeling of Stress : Not on file  Social Connections:   . Frequency of Communication with Friends and Family: Not on file  . Frequency of Social Gatherings with Friends and Family: Not on file  . Attends Religious Services: Not on file  . Active Member of Clubs or Organizations: Not on file  . Attends Archivist Meetings: Not on file  . Marital Status: Not on file     She is married for 55 years to my patient Mr. Lakenya Riendeau.  She has 2 children and 1 grandchild.  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  She completed 12th grade of education.  She does not exercise.  She does not drink alcohol.  Family History:  The patient's family history includes Fibromyalgia in her brother; Heart attack in her father and paternal grandfather; Heart disease in her brother; Hypertension in her brother and mother; Pneumonia in her mother; Pulmonary embolism in her brother.   Her mother died at age 41 secondary to anaphylaxis.  Her father died at age 64 with a heart attack.  A brother died at age 49 with heart problems.  She has a living sister age 73.  ROS General: Negative; No fevers, chills, or night sweats;  HEENT: Negative; No changes in vision or hearing, sinus congestion, difficulty swallowing Pulmonary: Positive for cough, asthma and bronchitis Cardiovascular: No chest pain, positive for palpitations GI: Negative; No nausea, vomiting, diarrhea, or abdominal pain GU: Negative; No dysuria,  hematuria, or difficulty voiding Musculoskeletal: Negative; no myalgias, joint pain, or weakness Hematologic/Oncology: Negative; no easy bruising, bleeding Endocrine: Negative; no heat/cold intolerance; no diabetes Neuro: Negative; no changes in balance, headaches Skin: Negative; No rashes or skin lesions Psychiatric: Negative; No behavioral problems, depression Sleep: History of OSA, previously documented to be severe.  Remotely used CPAP and remotely had a customized oral appliance.  Currently snores, nocturia 2 times per night, nonrestorative sleep, and she experiences daytime sleepiness.  norestless legs, hypnogognic hallucinations, no cataplexy; despite receiving a new machine on September 25, 2019 she never started to use therapy until Nov 21, 2019 and unfortunately due to failure to meet compliance her machine was picked up by the DME company. Other comprehensive 14 point system review is negative.   PHYSICAL EXAM:   VS:  BP 130/68   Pulse 74   Temp (!) 97.2 F (36.2 C)   Ht 5' 3.5" (1.613 m)   Wt 180 lb (81.6 kg)   SpO2 97%   BMI 31.39 kg/m     Repeat blood pressure by me was 140/76  Wt Readings from Last 3 Encounters:  03/22/20 180 lb (81.6 kg)  12/21/19 174 lb 3.2 oz (79 kg)  08/10/19 124 lb (56.2 kg)    General: Alert, oriented, no distress.  Skin: normal turgor, no rashes, warm and dry HEENT: Normocephalic, atraumatic. Pupils equal round and reactive to light; sclera  anicteric; extraocular muscles intact; Fun Nose without nasal septal hypertrophy Mouth/Parynx benign; Mallinpatti scale 3 Neck: No JVD, no carotid bruits; normal carotid upstroke Lungs: clear to ausculatation and percussion; no wheezing or rales Chest wall: without tenderness to palpitation Heart: PMI not displaced, RRR, s1 s2 normal, 1/6 systolic murmur, no diastolic murmur, no rubs, gallops, thrills, or heaves Abdomen: soft, nontender; no hepatosplenomehaly, BS+; abdominal aorta nontender and not dilated  by palpation. Back: no CVA tenderness Pulses 2+ Musculoskeletal: full range of motion, normal strength, no joint deformities Extremities: no clubbing cyanosis or edema, Homan's sign negative  Neurologic: grossly nonfocal; Cranial nerves grossly wnl Psychologic: Normal mood and affect   Studies/Labs Reviewed:    December 21, 2019 ECG (independently read by me): Sinus rhythm at 78 bpm with frequent PVCs.  Left bundle branch block with repolarization changes.  QTc interval 487 ms.  March 15, 2019 ECG (independently read by me): Sinus rhythm at 68 bpm, PVC, left bundle branch block with repolarization changes.  September 21, 2018 ECG (independently read by me): Sinus rhythm at 73 bpm with PACs, left bundle branch block with repolarization changes.  Recent Labs: BMP Latest Ref Rng & Units 12/19/2018 12/09/2018 09/19/2018  Glucose 65 - 99 mg/dL 89 95 91  BUN 8 - 27 mg/dL _0 Creatinine 0.57 - 1.00 mg/dL 0.83 0.91 0.90  BUN/Creat Ratio 12 - _1 -  Sodium 134 - 144 mmol/L 143 144 141  Potassium 3.5 - 5.2 mmol/L 4.1 4.5 3.6  Chloride 96 - 106 mmol/L 105 106 106  CO2 20 - 29 mmol/L _2 Calcium 8.7 - 10.3 mg/dL 9.4 9.5 9.9     Hepatic Function Latest Ref Rng & Units 08/03/2017 10/25/2015 12/19/2014  Total Protein 6.5 - 8.1 g/dL 7.6 7.0 6.7  Albumin 3.5 - 5.0 g/dL 4.2 4.1 3.7  AST 15 - 41 U/L _3 ALT 14 - 54 U/L _4 Alk Phosphatase 38 - 126 U/L 81 78 69  Total Bilirubin 0.3 - 1.2 mg/dL 0.6 0.3 0.6  Bilirubin, Direct 0.0 - 0.3 mg/dL - - -    CBC Latest Ref Rng & Units 09/19/2018 08/03/2017 08/03/2017  WBC 4.0 - 10.5 K/uL 12.6(H) - 8.0  Hemoglobin 12.0 - 15.0 g/dL 13.0 13.9 13.1  Hematocrit 36 - 46 % 39.0 41.0 41.0  Platelets 150 - 400 K/uL 262.0 - 264   Lab Results  Component Value Date   MCV 74.9 (L) 09/19/2018   MCV 78.5 08/03/2017   MCV 79.7 10/25/2015   Lab Results  Component Value Date   TSH 1.02 09/19/2018   Lab Results  Component Value Date   HGBA1C  6.0 (H) 12/19/2018     BNP    Component Value Date/Time   BNP 21.7 05/15/2015 1339    ProBNP    Component Value Date/Time   PROBNP 65.0 09/19/2018 1532     Lipid Panel     Component Value Date/Time   CHOL 158 12/19/2018 1358   TRIG 113 12/19/2018 1358   HDL 57 12/19/2018 1358   CHOLHDL 2.8 12/19/2018 1358   CHOLHDL 5.1 08/23/2014 1623   VLDL 75 (H) 08/23/2014 1623   LDLCALC 78 12/19/2018 1358     RADIOLOGY: No results found.   Additional studies/ records that were reviewed today include:  I have reviewed the records of Dr. Viona Gilmore. Tollie Eth.   ECHO Doppler study was in August 2018 which showed moderate concentric LVH  with abnormal septal wall motion due to left bundle branch block.  EF 50%.  Moderate LA dilation.  Mild to moderate MR.  Carotid duplex imaging from April 01, 2017: 1 to 39% right and left proximal internal carotid stenoses  Lexiscan Myoview studies March 25, 2017: Normal perfusion with EF at 43% with abnormal septal motion consistent with left bundle branch block.  The visual EF appeared higher.  It was felt that a low EF was affected by the bundle branch block and  gating artifact due to PVCs.  ECHO 12/09/2018 IMPRESSIONS  1. The left ventricle has a visually estimated ejection fraction of 45%.  The cavity size was normal. There is moderately increased left ventricular  wall thickness. Left ventricular diastolic Doppler parameters are  consistent with impaired relaxation.  Left ventrical global hypokinesis without regional wall motion  abnormalities. Septal-lateral dyssynchrony consistent with LBBB.  2. The right ventricle has normal systolic function. The cavity was  normal. There is no increase in right ventricular wall thickness.  3. Left atrial size was mildly dilated.  4. No evidence of mitral valve stenosis. Trivial mitral regurgitation.  5. The aortic valve is tricuspid. Mild calcification of the aortic valve.  No stenosis of the  aortic valve.  6. The aortic root is normal in size and structure.  7. The IVC was normal in size. No complete TR doppler jet so unable to  estimate PA systolic pressure.    LONG TERM MONITOR 01/19/2020 The patient was monitored from June 28 through January 08, 2019 for 13 days and 12 hours.  The predominant rhythm was sinus rhythm with an average heart rate of 71 bpm.  There was bundle branch block/I VCD.  There were frequent isolated PVCs at 9.5%, occasional couplet at 1.5% and rare triplets less than 1%.   There were rare episodes of ventricular bigeminy and trigeminy.  There was 1 run of irregular ventricular tachycardia lasting 4 beats with an average rate of 128 with a maximum rate at 174.  There were 2 supraventricular tachycardic episodes with the fastest interval 14 beats (average rate 120    ASSESSMENT:    1. Takotsubo syndrome   2. Nonischemic cardiomyopathy ([place])   3. Left bundle branch block   4. OSA (obstructive sleep apnea)   5. Essential hypertension   6. Frequent PVCs   7. Daytime sleepiness   8. Hyperlipidemia with target LDL less than 70     PLAN:  Laura [name] is a 74 year old female who suffered a non-ST segment elevation myocardial infarction at which time she was found to have findings consistent with Takotsubo cardiomyopathy.  EF at that time was 30 to 35%.  Coronary angiography revealed normal coronary arteries.  Her cardiomyopathy was nonobstructive.  She was treated with initial medical therapy including ACE inhibition, low-dose beta-blocker therapy as well as diuretic.  Subsequently, she was followed  by Dr. Tollie Eth  An echo Doppler assessment EF was approximately 50%.  She has chronic left bundle branch block.  She has a history of hypertension and  has been managed with hydralazine 50 mg daily, irbesartan 300 mg, Toprol XL 25 mg in addition to spironolactone 12.5 mg daily.   An echo Doppler study from June 2020  revealed an EF of approximately 45% and  findings consistent with her left bundle branch block with septal dyssynergy and grade 1 diastolic dysfunction.  She most recently has been on hydralazine 50 mg, irbesartan 300 mg daily, spironolactone 12.5 mg, and her  dose of metoprolol succinate has gradually been titrated over several visits up to 50 mg daily.  Her palpitations have improved.  She has continued to use generic Lunesta as needed to help with sleep initiation and maintenance.  Unfortunately she was not compliant with CPAP therapy and failed an attempt at oral appliance.  She continues to have very poor sleep.  She has investigated inspire.  I discussed this with her today.  She will be seeing Dr. Redmond Baseman next week to see if she qualifies as an alternative to CPAP therapy.  She continues to be on atorvastatin for hyperlipidemia and LDL cholesterol in July was 72 on 40 mg.  I am recommending a follow-up echo Doppler study be obtained in the next 2 to 3 months which will be 18 months since her last echo with plans for follow-up evaluation in 3 months.  Medication Adjustments/Labs and Tests Ordered: Current medicines are reviewed at length with the patient today.  Concerns regarding medicines are outlined above.  Medication changes, Labs and Tests ordered today are listed in the Patient Instructions below. Patient Instructions   Testing/Procedures:  Your physician has requested that you have an echocardiogram. Echocardiography is a painless test that uses sound waves to create images of your heart. It provides your doctor with information about the size and shape of your heart and how well your heart's chambers and valves are working. This procedure takes approximately one hour. There are no restrictions for this procedure.[place] 3 MONTHS  Follow-Up: At [facility], you and your health needs are our priority.  As part of our continuing mission to provide you with exceptional heart care, we have created  designated Provider Care Teams.  These Care Teams include your primary Cardiologist (physician) and Advanced Practice Providers (APPs -  Physician Assistants and Nurse Practitioners) who all work together to provide you with the care you need, when you need it.  We recommend signing up for the patient portal called "MyChart".  Sign up information is provided on this After Visit Summary.  MyChart is used to connect with patients for Virtual Visits (Telemedicine).  Patients are able to view lab/test results, encounter notes, upcoming appointments, etc.  Non-urgent messages can be sent to your provider as well.   To learn more about what you can do with MyChart, go to [email or web address].    Your next appointment:   3 month(s)  The format for your next appointment:   In Person  Provider:   You may see Shelva Majestic, MD or one of the following Advanced Practice Providers on your designated Care Team:    Almyra Deforest, PA-C  Fabian Sharp, PA-C or   Roby Lofts, [place]        Signed, Shelva Majestic, MD  03/22/2020 6:24 PM    [facility] [address], [place], [place],   [address] Phone: [phone]

## 2020-03-22 NOTE — Patient Instructions (Signed)
  Testing/Procedures:  Your physician has requested that you have an echocardiogram. Echocardiography is a painless test that uses sound waves to create images of your heart. It provides your doctor with information about the size and shape of your heart and how well your heart's chambers and valves are working. This procedure takes approximately one hour. There are no restrictions for this procedure.[place] 3 MONTHS  Follow-Up: At [facility], you and your health needs are our priority.  As part of our continuing mission to provide you with exceptional heart care, we have created designated Provider Care Teams.  These Care Teams include your primary Cardiologist (physician) and Advanced Practice Providers (APPs -  Physician Assistants and Nurse Practitioners) who all work together to provide you with the care you need, when you need it.  We recommend signing up for the patient portal called "MyChart".  Sign up information is provided on this After Visit Summary.  MyChart is used to connect with patients for Virtual Visits (Telemedicine).  Patients are able to view lab/test results, encounter notes, upcoming appointments, etc.  Non-urgent messages can be sent to your provider as well.   To learn more about what you can do with MyChart, go to [email or web address].    Your next appointment:   3 month(s)  The format for your next appointment:   In Person  Provider:   You may see Shelva Majestic, MD or one of the following Advanced Practice Providers on your designated Care Team:    Almyra Deforest, PA-C  Fabian Sharp, PA-C or   Roby Lofts, [place]

## 2020-03-26 DIAGNOSIS — G4733 Obstructive sleep apnea (adult) (pediatric): Secondary | ICD-10-CM | POA: Diagnosis not present

## 2020-04-22 DIAGNOSIS — K1379 Other lesions of oral mucosa: Secondary | ICD-10-CM | POA: Diagnosis not present

## 2020-04-22 DIAGNOSIS — M5412 Radiculopathy, cervical region: Secondary | ICD-10-CM | POA: Diagnosis not present

## 2020-04-26 ENCOUNTER — Other Ambulatory Visit: Payer: Self-pay | Admitting: Otolaryngology

## 2020-05-01 DIAGNOSIS — Z23 Encounter for immunization: Secondary | ICD-10-CM | POA: Diagnosis not present

## 2020-05-12 ENCOUNTER — Emergency Department (HOSPITAL_COMMUNITY): Payer: Medicare Other

## 2020-05-12 ENCOUNTER — Emergency Department (HOSPITAL_COMMUNITY)
Admission: EM | Admit: 2020-05-12 | Discharge: 2020-05-12 | Disposition: A | Discharge status: Home / Self Care | Payer: Medicare Other | Attending: Emergency Medicine | Admitting: Emergency Medicine

## 2020-05-12 ENCOUNTER — Other Ambulatory Visit: Payer: Self-pay

## 2020-05-12 ENCOUNTER — Encounter (HOSPITAL_COMMUNITY): Payer: Self-pay

## 2020-05-12 DIAGNOSIS — Z79899 Other long term (current) drug therapy: Secondary | ICD-10-CM | POA: Diagnosis not present

## 2020-05-12 DIAGNOSIS — I110 Hypertensive heart disease with heart failure: Secondary | ICD-10-CM | POA: Diagnosis not present

## 2020-05-12 DIAGNOSIS — I491 Atrial premature depolarization: Secondary | ICD-10-CM | POA: Diagnosis not present

## 2020-05-12 DIAGNOSIS — R519 Headache, unspecified: Secondary | ICD-10-CM | POA: Insufficient documentation

## 2020-05-12 DIAGNOSIS — I5042 Chronic combined systolic (congestive) and diastolic (congestive) heart failure: Secondary | ICD-10-CM | POA: Insufficient documentation

## 2020-05-12 DIAGNOSIS — E039 Hypothyroidism, unspecified: Secondary | ICD-10-CM | POA: Diagnosis not present

## 2020-05-12 DIAGNOSIS — K5730 Diverticulosis of large intestine without perforation or abscess without bleeding: Secondary | ICD-10-CM | POA: Diagnosis not present

## 2020-05-12 DIAGNOSIS — R911 Solitary pulmonary nodule: Secondary | ICD-10-CM | POA: Diagnosis not present

## 2020-05-12 DIAGNOSIS — M545 Low back pain, unspecified: Secondary | ICD-10-CM | POA: Diagnosis not present

## 2020-05-12 DIAGNOSIS — J9811 Atelectasis: Secondary | ICD-10-CM | POA: Diagnosis not present

## 2020-05-12 DIAGNOSIS — M549 Dorsalgia, unspecified: Secondary | ICD-10-CM | POA: Diagnosis not present

## 2020-05-12 DIAGNOSIS — M5459 Other low back pain: Secondary | ICD-10-CM | POA: Diagnosis not present

## 2020-05-12 DIAGNOSIS — I2510 Atherosclerotic heart disease of native coronary artery without angina pectoris: Secondary | ICD-10-CM | POA: Diagnosis not present

## 2020-05-12 DIAGNOSIS — R0789 Other chest pain: Secondary | ICD-10-CM | POA: Insufficient documentation

## 2020-05-12 DIAGNOSIS — I700 Atherosclerosis of aorta: Secondary | ICD-10-CM | POA: Diagnosis not present

## 2020-05-12 DIAGNOSIS — J45909 Unspecified asthma, uncomplicated: Secondary | ICD-10-CM | POA: Insufficient documentation

## 2020-05-12 DIAGNOSIS — R404 Transient alteration of awareness: Secondary | ICD-10-CM | POA: Diagnosis not present

## 2020-05-12 DIAGNOSIS — R079 Chest pain, unspecified: Secondary | ICD-10-CM | POA: Diagnosis not present

## 2020-05-12 DIAGNOSIS — R4182 Altered mental status, unspecified: Secondary | ICD-10-CM | POA: Diagnosis not present

## 2020-05-12 DIAGNOSIS — I447 Left bundle-branch block, unspecified: Secondary | ICD-10-CM | POA: Diagnosis not present

## 2020-05-12 LAB — CBC
HCT: 38.9 % (ref 36.0–46.0)
Hemoglobin: 12.3 g/dL (ref 12.0–15.0)
MCH: 25.7 pg — ABNORMAL LOW (ref 26.0–34.0)
MCHC: 31.6 g/dL (ref 30.0–36.0)
MCV: 81.4 fL (ref 80.0–100.0)
Platelets: 214 10*3/uL (ref 150–400)
RBC: 4.78 MIL/uL (ref 3.87–5.11)
RDW: 13.5 % (ref 11.5–15.5)
WBC: 6.7 10*3/uL (ref 4.0–10.5)
nRBC: 0 % (ref 0.0–0.2)

## 2020-05-12 LAB — TROPONIN I (HIGH SENSITIVITY)
Troponin I (High Sensitivity): 8 ng/L (ref <18)
Troponin I (High Sensitivity): 18 ng/L — ABNORMAL HIGH (ref <18)
Troponin I (High Sensitivity): 16 ng/L (ref <18)

## 2020-05-12 LAB — URINALYSIS, ROUTINE W REFLEX MICROSCOPIC
Bilirubin Urine: NEGATIVE
Glucose, UA: NEGATIVE mg/dL
Hgb urine dipstick: NEGATIVE
Ketones, ur: NEGATIVE mg/dL
Leukocytes,Ua: NEGATIVE
Nitrite: NEGATIVE
Protein, ur: NEGATIVE mg/dL
Specific Gravity, Urine: 1.01 (ref 1.005–1.030)
pH: 6.5 (ref 5.0–8.0)

## 2020-05-12 LAB — BASIC METABOLIC PANEL
Anion gap: 7 (ref 5–15)
BUN: 11 mg/dL (ref 8–23)
CO2: 24 mmol/L (ref 22–32)
Calcium: 9.1 mg/dL (ref 8.9–10.3)
Chloride: 107 mmol/L (ref 98–111)
Creatinine, Ser: 0.91 mg/dL (ref 0.44–1.00)
GFR, Estimated: >60 mL/min (ref >60)
Glucose, Bld: 110 mg/dL — ABNORMAL HIGH (ref 70–99)
Potassium: 3.8 mmol/L (ref 3.5–5.1)
Sodium: 138 mmol/L (ref 135–145)

## 2020-05-12 MED ORDER — DIAZEPAM 5 MG PO TABS: 5.0000 mg | ORAL_TABLET | Freq: Two times a day (BID) | ORAL | 0 refills | Status: DC | PRN

## 2020-05-12 MED ORDER — IOHEXOL 350 MG/ML SOLN
100.0000 mL | Freq: Once | INTRAVENOUS | Status: AC | PRN
  Administered 2020-05-12: 100 mL via INTRAVENOUS

## 2020-05-12 MED ORDER — FENTANYL CITRATE (PF) 100 MCG/2ML IJ SOLN
50.0000 ug | Freq: Once | INTRAMUSCULAR | Status: AC
  Administered 2020-05-12: 50 ug via INTRAVENOUS
  Filled 2020-05-12: qty 2

## 2020-05-12 MED ORDER — LORAZEPAM 2 MG/ML IJ SOLN: 1.0000 mg | Freq: Once | INTRAMUSCULAR | Status: DC

## 2020-05-12 MED ORDER — ONDANSETRON HCL 4 MG/2ML IJ SOLN
4.0000 mg | Freq: Once | INTRAMUSCULAR | Status: AC
  Administered 2020-05-12: 4 mg via INTRAVENOUS
  Filled 2020-05-12: qty 2

## 2020-05-12 NOTE — ED Notes (Signed)
Pt ambulated to restroom with steady gait. One person stand by assist for safety.

## 2020-05-12 NOTE — ED Notes (Signed)
Pt transported to XR.  

## 2020-05-12 NOTE — ED Triage Notes (Signed)
Per [facility]s pt coming from home reporting chest pain, slurred speech.pts chief co lower back pain screaming and uncooperative. Pt pt able to follow commands and aao2. HX of same slurred speech  speaking incoherent. Hx of nstemi, anxiety, chf, asthma. Family states last time this speech issue occurred she was dx with takotsudo syndrome. vss 140/72 hr 72, 20g in left forearm, 324mg  aspirin before ems arrived.

## 2020-05-12 NOTE — ED Notes (Signed)
Pt given a Cola and "ER bagged lunch", per Dr. Gilford Raid.

## 2020-05-12 NOTE — ED Provider Notes (Signed)
[facility] EMERGENCY DEPARTMENT Provider Note   CSN: [id number] Arrival date & time: 05/12/20  5170     History Chief Complaint  Patient presents with   Incoherent speech   Chest Pain   Back Pain    Laura [name] is a 74 y.o. female.  Pt presents to the ED today with back and chest pain.  Pt said she woke up this am with severe back pain.  Pain went into her chest.  She was screaming in pain when EMS arrived.  They reported that she had some slurred speech.  Pt's speech problem resolved by the time she arrived here.  Her husband gave her a pain pill prior to EMS arriving and EMS gave her 324 mg asa.  Pt said cp is gone.  Back pain is still there, but not as bad.  Pt does have a hx of back problems.          Past Medical History:  Diagnosis Date   Anemia    Anxiety    Asthma    related to sesonal allergies   Chronic combined systolic and diastolic CHF, NYHA class 2 ([place]) CARDIOLOGIST-  DR [id number]   Depression    History of kidney stones    History of non-ST elevation myocardial infarction (NSTEMI) JUNE 2011   SECONDARY TO TAKOTSUDO SYNDROME (CARDIAC CATH NORMAL)   Hyperlipemia    Hypertension    Hypoglycemia    Hypothyroidism    LBBB (left bundle branch block)    Left ventricular ejection fraction less than 40%    38% PER CARDIOLOGIST NOTE (DR CROITORU)   Memory loss    Mood swings    Nonischemic dilated cardiomyopathy ([place])    MODERATELY DEPRESSED LVF;EF 35-45% by Echo 05/27/11   OSA (obstructive sleep apnea) MODERATE PER STUDY 2005   CPAP NONCOMPLIANT   Seasonal allergies    SUI (stress urinary incontinence, female)     Patient Active Problem List   Diagnosis Date Noted   GERD (gastroesophageal reflux disease) 01/20/2019   Abnormal chest x-ray 10/20/2018   Cough variant asthma vs UACS/vcd 09/19/2018   DOE (dyspnea on exertion) 09/19/2018   Memory loss 10/14/2015   Mixed hyperlipidemia 08/23/2014   Major  depressive disorder, recurrent episode, severe ([place]) 09/22/2013   Vitamin D Deficiency 07/28/2013   Medication management 07/28/2013   Prediabetes 07/28/2013   Obstructive sleep apnea 05/27/2013   Arrhythmia 04/14/2013   Palpitations 03/12/2013   Delayed gastric emptying 10/18/2012   Steatohepatitis, nonalcoholic 67/59/1638   Female stress incontinence 09/19/2012   Takotsubo syndrome, June 2011.(normal coronaries) 09/12/2012   Cardiomyopathy- EF 45-50% by echo 09/26/12 09/12/2012   Anxiety disorder  09/12/2012   Essential hypertension 09/12/2012   LBBB (left bundle branch block) 09/12/2012   Obesity 09/12/2012   Sleep apnea- non compliant with C-pap 09/12/2012    Past Surgical History:  Procedure Laterality Date   ABDOMINAL HYSTERECTOMY  1985   partial   CARDIAC CATHETERIZATION  09-04-1999;  08/25/2004;   12/09/2009  DR CROITORU   NORMAL CORONARIES/  APICAL BALLOONING OF LV CONSISTENT WITH TAKOTSUBO SYMPTOMS/ EF 30-35%   CATARACT EXTRACTION W/ INTRAOCULAR LENS  IMPLANT, BILATERAL     CHOLECYSTECTOMY N/A 09/29/2012   Procedure: LAPAROSCOPIC CHOLECYSTECTOMY WITH INTRAOPERATIVE CHOLANGIOGRAM;  Surgeon: Adin Hector, MD;  Location: [place];  Service: General;  Laterality: N/A;   CYSTOSCOPY N/A 09/19/2012   Procedure: Erlene Quan;  Surgeon: Bernestine Amass, MD;  Location: [facility];  Service:  Urology;  Laterality: N/A;   KNEE ARTHROSCOPY W/ MENISCECTOMY  07-27-2011   MEDIAL AND LATERAL   NASAL SEPTUM SURGERY  1980's   PUBOVAGINAL SLING N/A 09/19/2012   Procedure: SUBURETHRAL Janyth Pupa;  Surgeon: Bernestine Amass, MD;  Location: [facility];  Service: Urology;  Laterality: N/A;   RIGHT URETEROSCOPIC STONE EXTRACTION  08-31-2000   TRANSTHORACIC ECHOCARDIOGRAM  05-27-2011  DR CROITORU   MODERATELY DEPRESSED LVF DUE TO GLOBAL HYPOKINESIS AND MARKED SYSTOLIC ASYNCHRONY/ EF 86%/ MILD LEFT ATRIAL DILATATION     OB History   No  obstetric history on file.     Family History  Problem Relation Age of Onset   Pneumonia Mother    Hypertension Mother    Heart attack Father    Fibromyalgia Brother    Pulmonary embolism Brother    Hypertension Brother    Heart disease Brother    Heart attack Paternal Grandfather     Social History   Tobacco Use   Smoking status: Never Smoker   Smokeless tobacco: Never Used  Substance Use Topics   Alcohol use: No    Alcohol/week: 0.0 standard drinks   Drug use: No    Home Medications Prior to Admission medications   Medication Sig Start Date End Date Taking? Authorizing Provider  atorvastatin (LIPITOR) 80 MG tablet TAKE 1/2 TABLET BY MOUTH DAILY FOR CHOLESTEROL 04/24/15   Croitoru, Mihai, MD  buPROPion HCl (WELLBUTRIN PO) Take 50 mg by mouth. 1 tablet Daily    [provider]  diazepam (VALIUM) 5 MG tablet Take 1 tablet (5 mg total) by mouth every 12 (twelve) hours as needed for muscle spasms. 05/12/20   Isla Pence, MD  ESZOPICLONE 3 MG tablet Take 1.5-3 mg by mouth at bedtime as needed. 02/12/20   [provider]  hydrALAZINE (APRESOLINE) 50 MG tablet Take 1 tablet (50 mg total) by mouth daily. 05/15/19   Jacolyn Reedy, MD  irbesartan (AVAPRO) 300 MG tablet Take 300 mg by mouth daily.    [provider]  levothyroxine (SYNTHROID, LEVOTHROID) 88 MCG tablet Take 88 mcg by mouth daily before breakfast.    [provider]  metoprolol succinate (TOPROL-XL) 50 MG 24 hr tablet Take 1 tablet (50 mg total) by mouth daily. 12/21/19   Troy Sine, MD  NUVIGIL 250 MG tablet Take 250 mg by mouth as needed. 06/14/15   [provider]  spironolactone (ALDACTONE) 25 MG tablet TAKE 1/2 TABLET BY MOUTH DAILY 01/29/20   Troy Sine, MD  [facility] XL 150 MG 24 hr tablet Take by mouth. 02/12/20   [provider]    Allergies    Codeine, Ace inhibitors, Citalopram, Fetzima [levomilnacipran], Lasix [furosemide],  Nsaids, and Xanax xr [alprazolam er]  Review of Systems   Review of Systems  Cardiovascular: Positive for chest pain.  Musculoskeletal: Positive for back pain.  All other systems reviewed and are negative.   Physical Exam Updated Vital Signs BP 111/76 (BP Location: Left Arm)    Pulse 73    Temp 98.6 F (37 C) (Oral)    Resp (!) 22    Ht 5\' 3"  (1.6 m)    Wt 74.8 kg    SpO2 99%    BMI 29.23 kg/m   Physical Exam Vitals and nursing note reviewed.  Constitutional:      Appearance: She is well-developed.  HENT:     Head: Normocephalic and atraumatic.  Eyes:     Extraocular Movements: Extraocular movements  intact.     Pupils: Pupils are equal, round, and reactive to light.  Cardiovascular:     Rate and Rhythm: Normal rate and regular rhythm.     Heart sounds: Normal heart sounds.  Pulmonary:     Effort: Pulmonary effort is normal.     Breath sounds: Normal breath sounds.  Abdominal:     General: Bowel sounds are normal.     Palpations: Abdomen is soft.  Musculoskeletal:        General: Normal range of motion.     Cervical back: Normal range of motion and neck supple.  Skin:    General: Skin is warm.     Capillary Refill: Capillary refill takes less than 2 seconds.  Neurological:     General: No focal deficit present.     Mental Status: She is alert and oriented to person, place, and time.  Psychiatric:        Mood and Affect: Mood is anxious.     ED Results / Procedures / Treatments   Labs (all labs ordered are listed, but only abnormal results are displayed) Labs Reviewed  BASIC METABOLIC PANEL - Abnormal; Notable for the following components:      Result Value   Glucose, Bld 110 (*)    All other components within normal limits  CBC - Abnormal; Notable for the following components:   MCH 25.7 (*)    All other components within normal limits  TROPONIN I (HIGH SENSITIVITY) - Abnormal; Notable for the following components:   Troponin I (High Sensitivity) 18 (*)     All other components within normal limits  URINALYSIS, ROUTINE W REFLEX MICROSCOPIC  TROPONIN I (HIGH SENSITIVITY)  TROPONIN I (HIGH SENSITIVITY)    EKG EKG Interpretation  Date/Time:  Sunday May 12 2020 07:18:18 EST Ventricular Rate:  96 PR Interval:    QRS Duration: 145 QT Interval:  436 QTC Calculation: 552 R Axis:   8 Text Interpretation: Sinus arrhythmia Multiple ventricular premature complexes Left bundle branch block No significant change since last tracing Confirmed by Isla Pence [phone]) on 05/12/2020 7:25:33 AM   Radiology DG Chest 2 View  Result Date: 05/12/2020 CLINICAL DATA:  Check pain EXAM: CHEST - 2 VIEW COMPARISON:  March 21, 2019 FINDINGS: There is mild left base atelectasis. The lungs elsewhere are clear. Heart is upper normal in size with pulmonary vascularity normal. There is degenerative change in the thoracic spine. There is aortic atherosclerosis. No adenopathy evident. IMPRESSION: Mild left base atelectasis. Lungs elsewhere clear. Heart upper normal in size. Aortic Atherosclerosis (ICD10-I70.0). Electronically Signed   By: Lowella Grip III M.D.   On: 05/12/2020 08:43   DG Lumbar Spine Complete  Result Date: 05/12/2020 CLINICAL DATA:  Low back pain EXAM: LUMBAR SPINE - COMPLETE 4+ VIEW COMPARISON:  September 24, 2004 FINDINGS: Frontal, lateral, spot lumbosacral lateral, and bilateral oblique views were obtained. There are 5 non-rib-bearing lumbar type vertebral bodies. There is mid to lower lumbar levoscoliosis. There is no fracture or spondylolisthesis. There is severe disc space narrowing at all levels, also present previously. There is facet osteoarthritic change at all levels bilaterally. There is aortic atherosclerosis. IMPRESSION: Scoliosis. Osteoarthritic change at all levels. No fracture or spondylolisthesis. Aortic Atherosclerosis (ICD10-I70.0). Electronically Signed   By: Lowella Grip III M.D.   On: 05/12/2020 08:43   CT Head Wo  Contrast  Result Date: 05/12/2020 CLINICAL DATA:  Altered mental status EXAM: CT HEAD WITHOUT CONTRAST TECHNIQUE: Contiguous axial images were obtained from the  base of the skull through the vertex without intravenous contrast. COMPARISON:  August 03, 2017 head CT; brain MRI August 19, 2017 FINDINGS: Brain: Mild age related volume loss is stable. There is no intracranial mass, hemorrhage, extra-axial fluid collection, or midline shift. There is mild small vessel disease in the centra semiovale bilaterally. Elsewhere brain parenchyma appears unremarkable. No evident acute infarct. Vascular: No hyperdense vessel. Foci of calcification in each carotid siphon noted. Skull: Bony calvarium appears intact. Sinuses/Orbits: There is opacification and mucosal thickening in multiple ethmoid air cells. There is a small air-fluid level in the left sphenoid sinus with mild mucosal thickening in the anterior left sphenoid sinus. Visualized orbits appear symmetric bilaterally. Other: Visualized mastoid air cells are clear. There is debris in the external auditory canals. IMPRESSION: Age related volume loss with mild periventricular small vessel disease. No mass or hemorrhage. No evident acute infarct. Foci of arterial vascular calcification noted. There are areas of paranasal sinus disease. There is probable cerumen in the external auditory canals. Electronically Signed   By: Lowella Grip III M.D.   On: 05/12/2020 10:09   CT Angio Chest/Abd/Pel for Dissection W and/or Wo Contrast  Result Date: 05/12/2020 CLINICAL DATA:  Chest and back pain.  Aortic dissection suspected. EXAM: CT ANGIOGRAPHY CHEST, ABDOMEN AND PELVIS TECHNIQUE: Non-contrast CT of the chest was initially obtained. Multidetector CT imaging through the chest, abdomen and pelvis was performed using the standard protocol during bolus administration of intravenous contrast. Multiplanar reconstructed images and MIPs were obtained and reviewed to evaluate  the vascular anatomy. CONTRAST:  153mL OMNIPAQUE IOHEXOL 350 MG/ML SOLN COMPARISON:  CT angio chest 12/07/2009.  CT stone study 05/06/2004. FINDINGS: CTA CHEST FINDINGS Cardiovascular: Pre contrast imaging shows no hyperdense crescent in the wall of the thoracic aorta to suggest the presence of an acute intramural hematoma. Ascending thoracic aorta measures 3.5 cm maximum diameter. The heart size is normal. No substantial pericardial effusion. Coronary artery calcification is evident. Atherosclerotic calcification is noted in the wall of the thoracic aorta. No central pulmonary embolus. No thoracic aortic dissection. Aortic arch vessel anatomy is widely patent. Mediastinum/Nodes: No mediastinal lymphadenopathy. There is no hilar lymphadenopathy. The esophagus has normal imaging features. There is no axillary lymphadenopathy. Lungs/Pleura: No focal airspace consolidation. No pleural effusion. Subtle mosaic attenuation is nonspecific but likely reflects air trapping. 3 mm anterior left upper lobe nodule visible on 50/7. 11 mm posterior left lower lobe nodule visible on 87/7 neither of these nodules were present on the 20/11 exam. Musculoskeletal: No worrisome lytic or sclerotic osseous abnormality. Review of the MIP images confirms the above findings. CTA ABDOMEN AND PELVIS FINDINGS VASCULAR Aorta: Normal caliber aorta without aneurysm, dissection, vasculitis or significant stenosis. Atherosclerotic calcification noted in the wall of the abdominal aorta. No substantial penetrating ulcer or ulcerated plaque evident. Celiac: Patent without evidence of aneurysm, dissection, vasculitis or significant stenosis. SMA: Patent without evidence of aneurysm, dissection, vasculitis or significant stenosis. Renals: Both renal arteries are patent without evidence of aneurysm, dissection, vasculitis, fibromuscular dysplasia or significant stenosis. Early branching noted on each renal artery. No accessory renal artery. IMA: Patent  without evidence of aneurysm, dissection, vasculitis or significant stenosis. Inflow: Patent without evidence of aneurysm, dissection, vasculitis or significant stenosis. Veins: No obvious venous abnormality within the limitations of this arterial phase study. Review of the MIP images confirms the above findings. NON-VASCULAR Hepatobiliary: No focal abnormality in the liver on this arterial phase study. Gallbladder surgically absent. Common bile duct upper normal at 7  mm diameter in the head of the pancreas. Pancreas: No focal mass lesion. No dilatation of the main duct. No intraparenchymal cyst. No peripancreatic edema. Spleen: No splenomegaly. No focal mass lesion. Adrenals/Urinary Tract: No adrenal nodule or mass. Kidneys unremarkable. No evidence for hydroureter. The urinary bladder appears normal for the degree of distention. Stomach/Bowel: Stomach is unremarkable. No gastric wall thickening. No evidence of outlet obstruction. Duodenum is normally positioned as is the ligament of Treitz. No small bowel wall thickening. No small bowel dilatation. The terminal ileum is normal. The appendix is normal. No gross colonic mass. No colonic wall thickening. Diverticular changes are noted in the left colon without evidence of diverticulitis. Lymphatic: There is no gastrohepatic or hepatoduodenal ligament lymphadenopathy. No retroperitoneal or mesenteric lymphadenopathy. No pelvic sidewall lymphadenopathy. Reproductive: Uterus surgically absent.  There is no adnexal mass. Other: No intraperitoneal free fluid. Musculoskeletal: No worrisome lytic or sclerotic osseous abnormality. Review of the MIP images confirms the above findings. IMPRESSION: 1. No evidence for thoracoabdominal aortic aneurysm or dissection. No evidence for acute intramural hematoma within the thoracic aorta. 2. No acute findings in the chest, abdomen, or pelvis. Specifically, no findings to explain the patient's history of chest and back pain. 3. Left  colonic diverticulosis without diverticulitis. 4. Aortic Atherosclerosis (ICD10-I70.0). Electronically Signed   By: Misty Stanley M.D.   On: 05/12/2020 10:19    Procedures Procedures (including critical care time)  Medications Ordered in ED Medications  LORazepam (ATIVAN) injection 1 mg (1 mg Intravenous Not Given 05/12/20 1001)  ondansetron (ZOFRAN) injection 4 mg (4 mg Intravenous Given 05/12/20 0736)  fentaNYL (SUBLIMAZE) injection 50 mcg (50 mcg Intravenous Given 05/12/20 0736)  iohexol (OMNIPAQUE) 350 MG/ML injection 100 mL (100 mLs Intravenous Contrast Given 05/12/20 0945)  fentaNYL (SUBLIMAZE) injection 50 mcg (50 mcg Intravenous Given 05/12/20 1032)    ED Course  I have reviewed the triage vital signs and the nursing notes.  Pertinent labs & imaging results that were available during my care of the patient were reviewed by me and considered in my medical decision making (see chart for details).    MDM Rules/Calculators/A&P                         CP is likely coming from her msk pain in her back.  CTA neg for dissection.  Cardiac eval nl.  Pt is able to ambulate without problems.  Pain is much better.  Final Clinical Impression(s) / ED Diagnoses Final diagnoses:  Acute bilateral low back pain without sciatica  Atypical chest pain    Rx / DC Orders ED Discharge Orders         Ordered    diazepam (VALIUM) 5 MG tablet  Every 12 hours PRN        05/12/20 1620           Isla Pence, MD 05/12/20 1620

## 2020-05-14 ENCOUNTER — Encounter (HOSPITAL_COMMUNITY): Payer: Self-pay | Admitting: Anesthesiology

## 2020-05-18 ENCOUNTER — Other Ambulatory Visit (HOSPITAL_COMMUNITY): Payer: Medicare Other

## 2020-05-22 ENCOUNTER — Ambulatory Visit (HOSPITAL_BASED_OUTPATIENT_CLINIC_OR_DEPARTMENT_OTHER): Admission: RE | Admit: 2020-05-22 | Payer: Medicare Other | Source: Home / Self Care | Admitting: Otolaryngology

## 2020-05-22 ENCOUNTER — Encounter (HOSPITAL_BASED_OUTPATIENT_CLINIC_OR_DEPARTMENT_OTHER): Admission: RE | Payer: Self-pay | Source: Home / Self Care

## 2020-05-22 SURGERY — DRUG INDUCED SLEEP ENDOSCOPY
Anesthesia: General

## 2020-05-29 DIAGNOSIS — M545 Low back pain, unspecified: Secondary | ICD-10-CM | POA: Diagnosis not present

## 2020-05-31 DIAGNOSIS — M545 Low back pain, unspecified: Secondary | ICD-10-CM | POA: Diagnosis not present

## 2020-06-06 DIAGNOSIS — M545 Low back pain, unspecified: Secondary | ICD-10-CM | POA: Diagnosis not present

## 2020-06-24 ENCOUNTER — Other Ambulatory Visit: Payer: Self-pay

## 2020-06-24 ENCOUNTER — Ambulatory Visit (HOSPITAL_COMMUNITY): Payer: Medicare Other | Attending: Cardiovascular Disease

## 2020-06-24 DIAGNOSIS — I5181 Takotsubo syndrome: Secondary | ICD-10-CM | POA: Insufficient documentation

## 2020-06-24 LAB — ECHOCARDIOGRAM COMPLETE
Area-P 1/2: 2.29 cm2
S' Lateral: 4.7 cm

## 2020-06-24 MED ORDER — PERFLUTREN LIPID MICROSPHERE
1.0000 mL | INTRAVENOUS | Status: AC | PRN
  Administered 2020-06-24: 1 mL via INTRAVENOUS

## 2020-06-25 ENCOUNTER — Telehealth: Payer: Self-pay | Admitting: Cardiovascular Disease

## 2020-06-25 NOTE — Telephone Encounter (Signed)
See Result Note for details.

## 2020-06-25 NOTE — Telephone Encounter (Signed)
     Pt is returning call for her echo result. She is asking since result is available does she have to come in to the office on 12/30 for her f/u?

## 2020-06-27 ENCOUNTER — Ambulatory Visit: Payer: Medicare Other | Admitting: Physician Assistant

## 2020-07-23 DIAGNOSIS — M5416 Radiculopathy, lumbar region: Secondary | ICD-10-CM | POA: Diagnosis not present

## 2020-07-30 HISTORY — PX: COLONOSCOPY: SHX174

## 2020-08-08 DIAGNOSIS — M5416 Radiculopathy, lumbar region: Secondary | ICD-10-CM | POA: Diagnosis not present

## 2020-08-13 DIAGNOSIS — M5416 Radiculopathy, lumbar region: Secondary | ICD-10-CM | POA: Diagnosis not present

## 2020-08-30 ENCOUNTER — Ambulatory Visit (INDEPENDENT_AMBULATORY_CARE_PROVIDER_SITE_OTHER): Payer: Medicare Other | Admitting: Cardiovascular Disease

## 2020-08-30 ENCOUNTER — Other Ambulatory Visit: Payer: Self-pay

## 2020-08-30 ENCOUNTER — Encounter: Payer: Self-pay | Admitting: Cardiovascular Disease

## 2020-08-30 VITALS — BP 156/75 | HR 81 | Ht 63.5 in | Wt 178.6 lb

## 2020-08-30 DIAGNOSIS — I700 Atherosclerosis of aorta: Secondary | ICD-10-CM

## 2020-08-30 DIAGNOSIS — G4733 Obstructive sleep apnea (adult) (pediatric): Secondary | ICD-10-CM | POA: Diagnosis not present

## 2020-08-30 DIAGNOSIS — I493 Ventricular premature depolarization: Secondary | ICD-10-CM

## 2020-08-30 DIAGNOSIS — I10 Essential (primary) hypertension: Secondary | ICD-10-CM | POA: Diagnosis not present

## 2020-08-30 DIAGNOSIS — I428 Other cardiomyopathies: Secondary | ICD-10-CM | POA: Diagnosis not present

## 2020-08-30 DIAGNOSIS — G4719 Other hypersomnia: Secondary | ICD-10-CM | POA: Diagnosis not present

## 2020-08-30 DIAGNOSIS — E785 Hyperlipidemia, unspecified: Secondary | ICD-10-CM | POA: Diagnosis not present

## 2020-08-30 MED ORDER — HYDRALAZINE HCL 50 MG PO TABS
ORAL_TABLET | ORAL | 1 refills | Status: DC
Start: 2020-08-30 — End: 2020-12-31

## 2020-08-30 MED ORDER — METOPROLOL SUCCINATE ER 50 MG PO TB24
75.0000 mg | ORAL_TABLET | Freq: Every day | ORAL | 3 refills | Status: DC
Start: 2020-08-30 — End: 2020-12-31

## 2020-08-30 NOTE — Patient Instructions (Addendum)
Medication Instructions:  Increase metoprolol to 75 mg daily (1.5 tablet) Increase Hydralazine to 50 mg (1 tablet) in the AM, and 25 mg (0.5 tablet) in the PM.  *If you need a refill on your cardiac medications before your next appointment, please call your pharmacy*   Lab Work: CMET, MAG today  If you have labs (blood work) drawn today and your tests are completely normal, you will receive your results only by: Marland Kitchen MyChart Message (if you have MyChart) OR . A paper copy in the mail If you have any lab test that is abnormal or we need to change your treatment, we will call you to review the results.    Follow-Up: At [facility], you and your health needs are our priority.  As part of our continuing mission to provide you with exceptional heart care, we have created designated Provider Care Teams.  These Care Teams include your primary Cardiologist (physician) and Advanced Practice Providers (APPs -  Physician Assistants and Nurse Practitioners) who all work together to provide you with the care you need, when you need it.  We recommend signing up for the patient portal called "MyChart".  Sign up information is provided on this After Visit Summary.  MyChart is used to connect with patients for Virtual Visits (Telemedicine).  Patients are able to view lab/test results, encounter notes, upcoming appointments, etc.  Non-urgent messages can be sent to your provider as well.   To learn more about what you can do with MyChart, go to [email or web address].    Your next appointment:   4 month(s)  The format for your next appointment:   In Person  Provider:   Shelva Majestic, MD

## 2020-08-30 NOTE — Progress Notes (Signed)
Cardiology Office Note    Date:  09/01/2020   ID:  Laura Laura [name], DOB 1945-10-20, MRN [id number]  PCP:  Shirline Frees, MD  Cardiologist:  Shelva Majestic, MD   F/U evaluation; former patient of Dr. Tollie Eth.  History of Present Illness:  Laura Laura [name] is Laura [name] 75 y.o. female who presents for 46-monthfollow-up evaluation.    Laura Laura [name] remote patient of Dr. CSallyanne Kuster  Apparently, she has Laura [name] history of left bundle branch block, and apparently was felt to have Laura [name] Takotsubo cardiomyopathy after she presented with Laura [name] non-ST segment delegation MI with Laura [name] peak troponin of 5.33.  She underwent cardiac catheterization by Dr. CSallyanne Kusteron December 09, 2009 and was found to have normal coronary arteries and classic findings of Takotsubo cardiomyopathy with apical ballooning of the left ventricle.  Her EF by echo was 30 to 35%.  At that time she was started on lisinopril, Lasix and low-dose metoprolol.  She also has Laura [name] history of asthma and bronchitis.  Over the past several years she has been followed by Dr. STollie Ethfor cardiology care.  She has Laura [name] history of hypertension and has been on irbesartan 300 mg, hydralazine 50 mg twice Laura [name] day, metoprolol succinate 12.5 mg daily.  She has Laura [name] history of hypothyroidism and has been on levothyroxine 88 mcg.  There also was Laura [name] history of some anxiety for which she had been given diazepam as well as citalopram.  I initially saw her in March 2020. Upon further questioning, the patient has Laura [name] history of severe sleep apnea and states that she is had 3 sleep studies with her last being many years ago by Dr. YAnnamaria Boots  She reports an AHI initially of 66.  She had been on CPAP therapy remotely remembers Laura [name] pressure of 14 cm but then became not tolerant to CPAP mask.  As result she later obtained an oral appliance by Dr. MOneal Grout  However she has had difficulty with some teeth fractures and and has not used her oral appliance in well over Laura [name] year.  She admits to snoring.   Her sleep is nonrestorative.  She was evaluated by Dr. MChristinia Gullyfor cough bronchitis/asthma.  [facility]  She was also recommended to take both pantoprazole and famotidine for GERD and its effect on her cough.    When I initially saw her in March 2020, I spent considerable time with her discussing her untreated sleep apnea and the significant improvements in mask and CPAP technology since her initial trial.  I recommended she undergo Laura [name] follow-up echo Doppler study.  She was on atorvastatin for hyperlipidemia.  Due to the COVID pandemic, she never underwent Laura [name] sleep study and upon further questioning today does not want to have one as long as she would have to have Laura [name] COVID test.  She underwent an echo Doppler study on December 09, 2018 which revealed an EF of 45% with septal lateral dyssynchrony consistent with left bundle branch block.  There was mild aortic sclerosis without stenosis.  She was seen by ADoreene Adason January 31, 2019 and at that time was having palpitations.  She admitted to being under significant recent stress.  There was some discussion concerning EP evaluation of her PVCs but she declined.   She underwent Laura [name] home sleep study on May 03, 2019 which showed moderate overall sleep apnea with an AHI of 26.9/h but since it was Laura [name] home study the severity  of sleep apnea during REM sleep was unable to be assessed.  Of note was that she had severe oxygen desaturation to Laura [name] nadir of 74%.  She snored 32.3% during sleep.  She was referred to undergo Laura [name] CPAP titration study which was done on August 10, 2019.  Pressure was titrated to 11 cm with excellent result with O2 saturation 94%.  Apparently, she received Laura [name] new CPAP machine with set up date September 25, 2019.  [employer] was her DME company.  Apparently, the patient never used the machine until May 25.  She had been contacted by the DME company of the importance of meeting compliance and that she will need to meet compliance by the  90-day window.  If she had continued use consistently over the past month she would have met compliance but unfortunately usage was only 17 of 30 days.  At 11 cm, pressure was still elevated at 13.2.  Her machine was picked up by her DME company and she no longer has therapy.  She continues to notice occasional palpitations.  She denies chest pain or shortness of breath.  At her last evaluation with me in June 2021, I spent Laura [name] significant amount of time with her.  She was considering the possibility of buying Laura [name] CPAP machine on the Internet since her machine had been returned due to poor compliance.  In the past she had failed because demised oral appliance and had been treated by Dr. Ron Parker.  Her EKG showed some PVCs occurring frequently and I recommended further titration of metoprolol succinate to 50 mg daily.  She continues to have daytime sleepiness and was taken Nuvigil on Laura [name] as needed basis.  I last saw her in September 2021 at which time she continued to have poor sleep.  She was continued to have daytime sleepiness. She has done some research on her own and would like to be evaluated for inspire technology.  She has subsequently seen Dr. Redmond Baseman of ENT. She continues to experience some shortness of breath but this seems to occur when she does not eat and seems to improve when she does eat.  She is not very active.  Her last echo Doppler study in February 2020 showed an EF of 45% with septal lateral dyssynchrony consistent with her left bundle branch block.   Presently, she states she has issues with inflammation.  She has been evaluated by orthopedist.  She has had balance issues.  She denies chest pain.  She underwent Laura [name] CT angio of her chest and abdomen due to significant discomfort in November 2021.  There was no evidence for thoracoabdominal aortic aneurysm or dissection.  There was no evidence for acute intramural hematoma within the thoracic aorta.  There were no acute findings in the chest abdomen or  pelvis.  There was evidence for left colonic diverticulosis without diverticulitis and she had aortic atherosclerosis.  She presents for evaluation.  Past Medical History:  Diagnosis Date  . Anemia   . Anxiety   . Asthma    related to sesonal allergies  . Chronic combined systolic and diastolic CHF, NYHA class 2 ([place]) CARDIOLOGIST-  DR [id number]  . Depression   . History of kidney stones   . History of non-ST elevation myocardial infarction (NSTEMI) JUNE 2011   SECONDARY TO TAKOTSUDO SYNDROME (CARDIAC CATH NORMAL)  . Hyperlipemia   . Hypertension   . Hypoglycemia   . Hypothyroidism   . LBBB (left bundle branch block)   . Left ventricular ejection fraction  less than 40%    38% PER CARDIOLOGIST NOTE (DR CROITORU)  . Memory loss   . Mood swings   . Nonischemic dilated cardiomyopathy ([place])    MODERATELY DEPRESSED LVF;EF 35-45% by Echo 05/27/11  . OSA (obstructive sleep apnea) MODERATE PER STUDY 2005   CPAP NONCOMPLIANT  . Seasonal allergies   . SUI (stress urinary incontinence, female)     Past Surgical History:  Procedure Laterality Date  . ABDOMINAL HYSTERECTOMY  1985   partial  . CARDIAC CATHETERIZATION  09-04-1999;  08/25/2004;   12/09/2009  DR CROITORU   NORMAL CORONARIES/  APICAL BALLOONING OF LV CONSISTENT WITH TAKOTSUBO SYMPTOMS/ EF 30-35%  . CATARACT EXTRACTION W/ INTRAOCULAR LENS  IMPLANT, BILATERAL    . CHOLECYSTECTOMY N/Laura [name] 09/29/2012   Procedure: LAPAROSCOPIC CHOLECYSTECTOMY WITH INTRAOPERATIVE CHOLANGIOGRAM;  Surgeon: Adin Hector, MD;  Location: [facility];  Service: General;  Laterality: N/Laura [name];  . CYSTOSCOPY N/Laura [name] 09/19/2012   Procedure: Erlene Quan;  Surgeon: Bernestine Amass, MD;  Location: [facility];  Service: Urology;  Laterality: N/Laura [name];  . KNEE ARTHROSCOPY W/ MENISCECTOMY  07-27-2011   MEDIAL AND LATERAL  . NASAL SEPTUM SURGERY  1980's  . PUBOVAGINAL SLING N/Laura [name] 09/19/2012   Procedure: SUBURETHRAL Janyth Pupa;  Surgeon: Bernestine Amass, MD;  Location:  [facility];  Service: Urology;  Laterality: N/Laura [name];  . RIGHT URETEROSCOPIC STONE EXTRACTION  08-31-2000  . TRANSTHORACIC ECHOCARDIOGRAM  05-27-2011  DR CROITORU   MODERATELY DEPRESSED LVF DUE TO GLOBAL HYPOKINESIS AND MARKED SYSTOLIC ASYNCHRONY/ EF 94%/ MILD LEFT ATRIAL DILATATION    Current Medications: Outpatient Medications Prior to Visit  Medication Sig Dispense Refill  . atorvastatin (LIPITOR) 80 MG tablet TAKE 1/2 TABLET BY MOUTH DAILY FOR CHOLESTEROL 45 tablet 3  . diazepam (VALIUM) 5 MG tablet Take 1 tablet (5 mg total) by mouth every 12 (twelve) hours as needed for muscle spasms. 10 tablet 0  . gabapentin (NEURONTIN) 100 MG capsule Take by mouth.    Marland Kitchen HYDROcodone-acetaminophen (NORCO/VICODIN) 5-325 MG tablet Take 1 tablet by mouth 3 (three) times daily as needed.    . irbesartan (AVAPRO) 300 MG tablet Take 300 mg by mouth daily.    Marland Kitchen levothyroxine (SYNTHROID, LEVOTHROID) 88 MCG tablet Take 88 mcg by mouth daily before breakfast.    . NUVIGIL 250 MG tablet Take 250 mg by mouth as needed.  3  . spironolactone (ALDACTONE) 25 MG tablet TAKE 1/2 TABLET BY MOUTH DAILY 45 tablet 3  . buPROPion HCl (WELLBUTRIN PO) Take 50 mg by mouth. 1 tablet Daily    . ESZOPICLONE 3 MG tablet Take 1.5-3 mg by mouth at bedtime as needed.    . hydrALAZINE (APRESOLINE) 50 MG tablet Take 1 tablet (50 mg total) by mouth daily. 60 tablet 1  . metoprolol succinate (TOPROL-XL) 50 MG 24 hr tablet Take 1 tablet (50 mg total) by mouth daily. 90 tablet 3  . WELLBUTRIN XL 150 MG 24 hr tablet Take by mouth.     No facility-administered medications prior to visit.     Allergies:   Codeine, Ace inhibitors, Citalopram, Fetzima [levomilnacipran], Lasix [furosemide], Nsaids, and Xanax xr [alprazolam er]   Social History   Socioeconomic History  . Marital status: Married    Spouse name: Richard  . Number of children: 2  . Years of education: 12+  . Highest education level: Some college, no degree   Occupational History  . Occupation: Retired  Tobacco Use  . Smoking status: Never Smoker  .  Smokeless tobacco: Never Used  Substance and Sexual Activity  . Alcohol use: No    Alcohol/week: 0.0 standard drinks  . Drug use: No  . Sexual activity: Never    Birth control/protection: Post-menopausal  Other Topics Concern  . Not on file  Social History Narrative   Lives at home with husband.   Right-handed.   Drinks 2-3 cups caffeine per day.   No regular exercise.   Social Determinants of Health   Financial Resource Strain: Not on file  Food Insecurity: Not on file  Transportation Needs: Not on file  Physical Activity: Not on file  Stress: Not on file  Social Connections: Not on file     She is married for over 55 years to my patient Mr. Canisha Issac.  She has 2 children and 1 grandchild.  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  She completed 12th grade of education.  She does not exercise.  She does not drink alcohol.  Family History:  The patient's family history includes Fibromyalgia in her brother; Heart attack in her father and paternal grandfather; Heart disease in her brother; Hypertension in her brother and mother; Pneumonia in her mother; Pulmonary embolism in her brother.   Her mother died at age 75 secondary to anaphylaxis.  Her father died at age 60 with Laura [name] heart attack.  Laura [name] brother died at age 73 with heart problems.  She has Laura [name] living sister age 64.  ROS General: Negative; No fevers, chills, or night sweats;  HEENT: Negative; No changes in vision or hearing, sinus congestion, difficulty swallowing Pulmonary: Positive for cough, asthma and bronchitis Cardiovascular: No chest pain, positive for palpitations GI: Negative; No nausea, vomiting, diarrhea, or abdominal pain GU: Negative; No dysuria, hematuria, or difficulty voiding Musculoskeletal: Negative; no myalgias, joint pain, or weakness Hematologic/Oncology: Negative; no easy  bruising, bleeding Endocrine: Negative; no heat/cold intolerance; no diabetes Neuro: Negative; no changes in balance, headaches Skin: Negative; No rashes or skin lesions Psychiatric: Negative; No behavioral problems, depression Sleep: History of OSA, previously documented to be severe.  Remotely used CPAP and remotely had Laura [name] customized oral appliance.  Currently snores, nocturia 2 times per night, nonrestorative sleep, and she experiences daytime sleepiness.  norestless legs, hypnogognic hallucinations, no cataplexy; despite receiving Laura [name] new machine on September 25, 2019 she never started to use therapy until Nov 21, 2019 and unfortunately due to failure to meet compliance her machine was picked up by the DME company. Other comprehensive 14 point system review is negative.   PHYSICAL EXAM:   VS:  BP (!) 156/75   Pulse 81   Ht 5' 3.5" (1.613 m)   Wt 178 lb 9.6 oz (81 kg)   SpO2 100%   BMI 31.14 kg/m     Repeat blood pressure by me 158/80  Wt Readings from Last 3 Encounters:  08/30/20 178 lb 9.6 oz (81 kg)  05/12/20 165 lb (74.8 kg)  03/22/20 180 lb (81.6 kg)    General: Alert, oriented, no distress.  Skin: normal turgor, no rashes, warm and dry HEENT: Normocephalic, atraumatic. Pupils equal round and reactive to light; sclera anicteric; extraocular muscles intact;  Nose without nasal septal hypertrophy Mouth/Parynx benign; Mallinpatti scale 3 Neck: No JVD, no carotid bruits; normal carotid upstroke Lungs: clear to ausculatation and percussion; no wheezing or rales Chest wall: without tenderness to palpitation Heart: PMI not displaced, RRR, s1 s2 normal, 1/6 systolic murmur, no diastolic murmur, no rubs, gallops, thrills, or  heaves Abdomen: soft, nontender; no hepatosplenomehaly, BS+; abdominal aorta nontender and not dilated by palpation. Back: no CVA tenderness Pulses 2+ Musculoskeletal: full range of motion, normal strength, no joint deformities Extremities: no clubbing cyanosis or  edema, Homan's sign negative  Neurologic: grossly nonfocal; Cranial nerves grossly wnl Psychologic: Normal mood and affect   Studies/Labs Reviewed:   ECG (independently read by me):  Sinus rhythm at 81, occasional PVCs, LBBB  December 21, 2019 ECG (independently read by me): Sinus rhythm at 78 bpm with frequent PVCs.  Left bundle branch block with repolarization changes.  QTc interval 487 ms.  March 15, 2019 ECG (independently read by me): Sinus rhythm at 68 bpm, PVC, left bundle branch block with repolarization changes.  September 21, 2018 ECG (independently read by me): Sinus rhythm at 73 bpm with PACs, left bundle branch block with repolarization changes.  Recent Labs: BMP Latest Ref Rng & Units 08/30/2020 05/12/2020 12/19/2018  Glucose 65 - 99 mg/dL 98 110(H) 89  BUN 8 - 27 mg/dL '10 11 12  ' Creatinine 0.57 - 1.00 mg/dL 0.90 0.91 0.83  BUN/Creat Ratio 12 - 28 11(L) - 14  Sodium 134 - 144 mmol/L 144 138 143  Potassium 3.5 - 5.2 mmol/L 3.8 3.8 4.1  Chloride 96 - 106 mmol/L 106 107 105  CO2 20 - 29 mmol/L '24 24 25  ' Calcium 8.7 - 10.3 mg/dL 9.6 9.1 9.4     Hepatic Function Latest Ref Rng & Units 08/30/2020 08/03/2017 10/25/2015  Total Protein 6.0 - 8.5 g/dL 6.8 7.6 7.0  Albumin 3.7 - 4.7 g/dL 4.3 4.2 4.1  AST 0 - 40 IU/L '20 26 24  ' ALT 0 - 32 IU/L '19 27 24  ' Alk Phosphatase 44 - 121 IU/L 68 81 78  Total Bilirubin 0.0 - 1.2 mg/dL 0.4 0.6 0.3  Bilirubin, Direct 0.0 - 0.3 mg/dL - - -    CBC Latest Ref Rng & Units 05/12/2020 09/19/2018 08/03/2017  WBC 4.0 - 10.5 K/uL 6.7 12.6(H) -  Hemoglobin 12.0 - 15.0 g/dL 12.3 13.0 13.9  Hematocrit 36.0 - 46.0 % 38.9 39.0 41.0  Platelets 150 - 400 K/uL 214 262.0 -   Lab Results  Component Value Date   MCV 81.4 05/12/2020   MCV 74.9 (L) 09/19/2018   MCV 78.5 08/03/2017   Lab Results  Component Value Date   TSH 1.02 09/19/2018   Lab Results  Component Value Date   HGBA1C 6.0 (H) 12/19/2018     BNP    Component Value Date/Time   BNP 21.7  05/15/2015 1339    ProBNP    Component Value Date/Time   PROBNP 65.0 09/19/2018 1532     Lipid Panel     Component Value Date/Time   CHOL 158 12/19/2018 1358   TRIG 113 12/19/2018 1358   HDL 57 12/19/2018 1358   CHOLHDL 2.8 12/19/2018 1358   CHOLHDL 5.1 08/23/2014 1623   VLDL 75 (H) 08/23/2014 1623   LDLCALC 78 12/19/2018 1358     RADIOLOGY: No results found.   Additional studies/ records that were reviewed today include:  I have reviewed the records of Dr. Viona Gilmore. Tollie Eth.   ECHO Doppler study was in August 2018 which showed moderate concentric LVH with abnormal septal wall motion due to left bundle branch block.  EF 50%.  Moderate LA dilation.  Mild to moderate MR.  Carotid duplex imaging from April 01, 2017: 1 to 39% right and left proximal internal carotid stenoses  Lexiscan Myoview studies March 25, 2017: Normal perfusion with EF at 43% with abnormal septal motion consistent with left bundle branch block.  The visual EF appeared higher.  It was felt that Laura [name] low EF was affected by the bundle branch block and  gating artifact due to PVCs.  ECHO 12/09/2018 IMPRESSIONS  1. The left ventricle has Laura [name] visually estimated ejection fraction of 45%.  The cavity size was normal. There is moderately increased left ventricular  wall thickness. Left ventricular diastolic Doppler parameters are  consistent with impaired relaxation.  Left ventrical global hypokinesis without regional wall motion  abnormalities. Septal-lateral dyssynchrony consistent with LBBB.  2. The right ventricle has normal systolic function. The cavity was  normal. There is no increase in right ventricular wall thickness.  3. Left atrial size was mildly dilated.  4. No evidence of mitral valve stenosis. Trivial mitral regurgitation.  5. The aortic valve is tricuspid. Mild calcification of the aortic valve.  No stenosis of the aortic valve.  6. The aortic root is normal in size and structure.  7.  The IVC was normal in size. No complete TR doppler jet so unable to  estimate PA systolic pressure.    LONG TERM MONITOR 01/19/2020 The patient was monitored from June 28 through January 08, 2019 for 13 days and 12 hours.  The predominant rhythm was sinus rhythm with an average heart rate of 71 bpm.  There was bundle branch block/I VCD.  There were frequent isolated PVCs at 9.5%, occasional couplet at 1.5% and rare triplets less than 1%.   There were rare episodes of ventricular bigeminy and trigeminy.  There was 1 run of irregular ventricular tachycardia lasting 4 beats with an average rate of 128 with Laura [name] maximum rate at 174.  There were 2 supraventricular tachycardic episodes with the fastest interval 14 beats (average rate 120    ASSESSMENT:    1. Essential hypertension   2. Nonischemic cardiomyopathy ([place]); history of Takotsubo 2011   3. Frequent PVCs   4. Hyperlipidemia with target LDL less than 70   5. Aortic atherosclerosis ([place])   6. OSA (obstructive sleep apnea)   7. Excessive daytime sleepiness     PLAN:  Laura Laura [name] is Laura [name] 75 year old female who suffered Laura [name] non-ST segment elevation myocardial infarction in June 2011 at which time she was found to have findings consistent with Takotsubo cardiomyopathy.  EF at that time was 30 to 35%.  Coronary angiography revealed normal coronary arteries.  Her cardiomyopathy was nonobstructive.  She was treated with initial medical therapy including ACE inhibition, low-dose beta-blocker therapy as well as diuretic.  Subsequently, she was followed  by Dr. Tollie Eth  An echo Doppler assessment EF was approximately 50%.  She has chronic left bundle branch block.  She has Laura [name] history of hypertension and  has been managed with hydralazine 50 mg daily, irbesartan 300 mg, Toprol XL 25 mg in addition to spironolactone 12.5 mg daily.   An echo Doppler study from June 2020  revealed an EF of approximately 45% and findings consistent with her left bundle branch  block with septal dyssynergy and grade 1 diastolic dysfunction.  She most recently has been on hydralazine 50 mg, irbesartan 300 mg daily, spironolactone 12.5 mg, and her dose of metoprolol succinate has gradually been titrated over several visits up to 50 mg daily.  Her palpitations have improved.  She has continued to use generic Lunesta as needed to help with sleep initiation and maintenance.  Unfortunately she was not compliant  with CPAP therapy and failed an attempt at oral appliance.  She has been evaluated by Dr. Redmond Baseman for consideration of inspire technology but has not had this procedure.  Her blood pressure today is elevated despite taking irbesartan 300 mg daily, metoprolol succinate 50 mg and apparently she has only been taking hydralazine 50 mg once Laura [name] day.  She had been on spironolactone but is no longer on this.  I discussed with her the importance of at least twice Laura [name] day hydralazine use.  Her resting pulse is in the 80s and she has PVCs on ECG.  I have recommended further titration of metoprolol to 75 mg twice Laura [name] day.  She will at least increase hydralazine and take 50 mg in the morning and 25 mg at night but depending upon blood pressure response she may require 50 mg twice Laura [name] day.  I will recheck Laura [name] chemistry profile as well as magnesium level.  She continues to be on atorvastatin for hyperlipidemia.  LDL cholesterol in July 2021 was 72.  She was found to have aortic atherosclerosis on recent CT imaging.  She is on levothyroxine for hypothyroidism.  She continues to take vigil for daytime sleepiness.  I will see her in 4 months for reevaluation    Medication Adjustments/Labs and Tests Ordered: Current medicines are reviewed at length with the patient today.  Concerns regarding medicines are outlined above.  Medication changes, Labs and Tests ordered today are listed in the Patient Instructions below. Patient Instructions  Medication Instructions:  Increase metoprolol to 75 mg daily (1.5  tablet) Increase Hydralazine to 50 mg (1 tablet) in the AM, and 25 mg (0.5 tablet) in the PM.  *If you need Laura [name] refill on your cardiac medications before your next appointment, please call your pharmacy*   Lab Work: CMET, MAG today  If you have labs (blood work) drawn today and your tests are completely normal, you will receive your results only by: Marland Kitchen MyChart Message (if you have MyChart) OR . Laura [name] paper copy in the mail If you have any lab test that is abnormal or we need to change your treatment, we will call you to review the results.    Follow-Up: At [facility], you and your health needs are our priority.  As part of our continuing mission to provide you with exceptional heart care, we have created designated Provider Care Teams.  These Care Teams include your primary Cardiologist (physician) and Advanced Practice Providers (APPs -  Physician Assistants and Nurse Practitioners) who all work together to provide you with the care you need, when you need it.  We recommend signing up for the patient portal called "MyChart".  Sign up information is provided on this After Visit Summary.  MyChart is used to connect with patients for Virtual Visits (Telemedicine).  Patients are able to view lab/test results, encounter notes, upcoming appointments, etc.  Non-urgent messages can be sent to your provider as well.   To learn more about what you can do with MyChart, go to [email or web address].    Your next appointment:   4 month(s)  The format for your next appointment:   In Person  Provider:   Shelva Majestic, MD         Signed, Shelva Majestic, MD  09/01/2020 12:32 PM    [facility] [address] [place], [place], [place]  [address] Phone: [phone]

## 2020-08-31 LAB — COMPREHENSIVE METABOLIC PANEL
ALT: 19 IU/L (ref 0–32)
AST: 20 IU/L (ref 0–40)
Albumin/Globulin Ratio: 1.7 (ref 1.2–2.2)
Albumin: 4.3 g/dL (ref 3.7–4.7)
Alkaline Phosphatase: 68 IU/L (ref 44–121)
BUN/Creatinine Ratio: 11 — ABNORMAL LOW (ref 12–28)
BUN: 10 mg/dL (ref 8–27)
Bilirubin Total: 0.4 mg/dL (ref 0.0–1.2)
CO2: 24 mmol/L (ref 20–29)
Calcium: 9.6 mg/dL (ref 8.7–10.3)
Chloride: 106 mmol/L (ref 96–106)
Creatinine, Ser: 0.9 mg/dL (ref 0.57–1.00)
Globulin, Total: 2.5 g/dL (ref 1.5–4.5)
Glucose: 98 mg/dL (ref 65–99)
Potassium: 3.8 mmol/L (ref 3.5–5.2)
Sodium: 144 mmol/L (ref 134–144)
Total Protein: 6.8 g/dL (ref 6.0–8.5)
eGFR: 67 mL/min/{1.73_m2} (ref >59)

## 2020-08-31 LAB — MAGNESIUM: Magnesium: 2.2 mg/dL (ref 1.6–2.3)

## 2020-09-01 ENCOUNTER — Encounter: Payer: Self-pay | Admitting: Cardiovascular Disease

## 2020-09-03 DIAGNOSIS — M5416 Radiculopathy, lumbar region: Secondary | ICD-10-CM | POA: Diagnosis not present

## 2020-09-10 DIAGNOSIS — M5416 Radiculopathy, lumbar region: Secondary | ICD-10-CM | POA: Diagnosis not present

## 2020-09-25 DIAGNOSIS — M5416 Radiculopathy, lumbar region: Secondary | ICD-10-CM | POA: Diagnosis not present

## 2020-09-26 DIAGNOSIS — G4730 Sleep apnea, unspecified: Secondary | ICD-10-CM | POA: Diagnosis not present

## 2020-09-26 DIAGNOSIS — F331 Major depressive disorder, recurrent, moderate: Secondary | ICD-10-CM | POA: Diagnosis not present

## 2020-09-26 DIAGNOSIS — I255 Ischemic cardiomyopathy: Secondary | ICD-10-CM | POA: Diagnosis not present

## 2020-09-26 DIAGNOSIS — F411 Generalized anxiety disorder: Secondary | ICD-10-CM | POA: Diagnosis not present

## 2020-09-26 DIAGNOSIS — E039 Hypothyroidism, unspecified: Secondary | ICD-10-CM | POA: Diagnosis not present

## 2020-09-26 DIAGNOSIS — E7800 Pure hypercholesterolemia, unspecified: Secondary | ICD-10-CM | POA: Diagnosis not present

## 2020-09-26 DIAGNOSIS — F5101 Primary insomnia: Secondary | ICD-10-CM | POA: Diagnosis not present

## 2020-09-26 DIAGNOSIS — K130 Diseases of lips: Secondary | ICD-10-CM | POA: Diagnosis not present

## 2020-09-26 DIAGNOSIS — I10 Essential (primary) hypertension: Secondary | ICD-10-CM | POA: Diagnosis not present

## 2020-10-08 ENCOUNTER — Ambulatory Visit (HOSPITAL_COMMUNITY)
Admission: EM | Admit: 2020-10-08 | Discharge: 2020-10-08 | Disposition: A | Discharge status: Home / Self Care | Payer: Medicare Other | Attending: Psychiatry | Admitting: Psychiatry

## 2020-10-08 ENCOUNTER — Other Ambulatory Visit: Payer: Self-pay

## 2020-10-08 DIAGNOSIS — F4323 Adjustment disorder with mixed anxiety and depressed mood: Secondary | ICD-10-CM

## 2020-10-08 DIAGNOSIS — F411 Generalized anxiety disorder: Secondary | ICD-10-CM

## 2020-10-08 DIAGNOSIS — Z1330 Encounter for screening examination for mental health and behavioral disorders, unspecified: Secondary | ICD-10-CM | POA: Insufficient documentation

## 2020-10-08 NOTE — ED Provider Notes (Signed)
[facility] Medical Screening Exam  Patient Name: Laura [name] MRN: [id number] Date of Evaluation: 10/08/20 Chief Complaint:   Diagnosis:  Final diagnoses:  None    History of Present illness: Laura [name] is a 75 y.o. female with pmh of OSA, HTN, hypothyroidism, LBBB with suspected cardiomyopathy, reported GAD and PTSD who presents to the [facility] voluntarily with anxiety, racing thoughts, and difficulty sleeping.   Pt states that she is "not doing too good but that is nothing new". She states that she was at her PCP appointment 2 weeks ago and they were discussing "several issues" and that PCP suggested he see a psychiatrist.  Pt reports that she has an appointment with [facility] on 4/19. She states that when she called [facility] and explained what was going on that "they told me that I can to come and see a nurse practitioner until my appointment". She states that she has seen several psychiatrists in the past and that "they just give me medication" that makes her feel confused and that the medication has not been helpful. She states that she has tried "so many [medications]" and that "none of them were helpful". She describes one previous psychiatrist as a "quack". She was hospitalized in a psychiatric hospital x1 when she was in her 64s for SI; was in the hospital for 13 days. Denies SA. Pt discusses at length her stressors which include family stressors as well as medical. "I can't sleep, I have sleep apnea, cardiomyopathy, I stay scared all the time. Yesterday I was in the kitchen and almost fell on the floor". She reports sleeping poorly and describes having to sleep sitting up in a recliner d/t back pain and that she does not use a CPAP because "it suffocates me". She indicates that her cardiologist "is having a fit" because it is not good for her heart to sleep without a CPAP. She describes experiencing anxious ruminations that affect her ability to fall asleep. Discussed at length  that disturbed sleep is likely multifactorial as pain, untreated OSA and anxiety are likely contributing. She states that her mother passed away in 09/24/2010 and indicates that a medical mistake was made that led to her passing. She also states that in September 23, 2016 her brother was killed by a woman he was dating and that same year she was  experiencing a lot of stress related to her granddaughter's behavior and how she treated her. She denies active SI, HI, AVH. She describes having passive SI at times since she would have "no pain" but has no plan or intent. She indicates that she felt like killing the woman who killed her brother and felt so frustrated with her granddaughter at one point she felt like that but denies feeling like that currently.   At this time patient does not meet criteria for inpatient admission. Discussed that this is a crisis assessment and that medication changes are not made in the ER setting. Advised patient to keep her current appointment with [facility]. Recommended that if she is feeling unsafe/experiencing suicidal thoughts to return to the ED, [place], or call 911. Pt verbalized understanding. Pt expressed concern that [facility] may not be able to help her as she has had little relief from her anxiety in the past when seeing psychiatrists and requests additional resources. SW consulted for assistance; referrals placed in AVS.   Psychiatric Specialty Exam  Presentation  General Appearance:Casual; Appropriate for Environment  Eye Contact:Fair  Speech:Clear and Coherent; Normal Rate  Speech Volume:Normal  Handedness:No data recorded  Mood and Affect  Mood:Anxious  Affect:Appropriate; Congruent   Thought Process  Thought Processes:Coherent; Goal Directed; Linear  Descriptions of Associations:Intact  Orientation:Full (Time, Place and Person)  Thought Content:WDL    Hallucinations:None  Ideas of Reference:None  Suicidal Thoughts:No  Homicidal  Thoughts:No   Sensorium  Memory:Immediate Good; Recent Good; Remote Good  Judgment:Fair  Insight:Fair   Executive Functions  Concentration:Fair  Attention Span:Fair  [place]  Language:Good   Psychomotor Activity  Psychomotor Activity:Normal   Assets  Assets:Communication Skills; Desire for Improvement; Housing; Catering manager; Resilience; Social Support   Sleep  Sleep:Poor  Number of hours: No data recorded  No data recorded  Physical Exam: Physical Exam Constitutional:      Appearance: Normal appearance.  HENT:     Head: Normocephalic and atraumatic.  Pulmonary:     Effort: Pulmonary effort is normal.  Neurological:     Mental Status: She is alert.    Review of Systems  Constitutional: Negative for chills and fever.  HENT: Negative for hearing loss.   Eyes: Negative for discharge and redness.  Respiratory: Negative for cough.   Cardiovascular: Negative for chest pain.  Musculoskeletal: Positive for back pain.  Psychiatric/Behavioral: Negative for suicidal ideas.   Blood pressure 132/79, pulse 60, temperature 97.8 F (36.6 C), temperature source Tympanic, SpO2 98 %. There is no height or weight on file to calculate BMI.  Musculoskeletal: Strength & Muscle Tone: within normal limits Gait & Station: slow Patient leans: N/A   [place] MSE Discharge Disposition for Follow up and Recommendations: Based on my evaluation the patient does not appear to have an emergency medical condition and can be discharged with resources and follow up care in outpatient services for Medication Management and [place], MD 10/08/2020, 11:53 AM

## 2020-10-08 NOTE — Discharge Instructions (Signed)

## 2020-10-08 NOTE — BH Assessment (Signed)
Determination of urgency is ROUTINE. She reports severe anxiety, racing thoughts and inability to sleep. Pt has an appointment with [facility] for next tues, 4/19. In going over appt with PCP, they recommended pt [facility] to [facility] for assessment. Pt denies current SI. She admits recent SI without plan stating, "I don't have the nerve". Pt states she gets angry very quickly and has had thoughts of harming people when they make her mad, but she has not made plans to harm anyone. Pt denies AVH. She reports she has a heart problem and chronic pain.

## 2020-10-08 NOTE — Discharge Summary (Signed)
Laura [name] to be D/C'd per MD order. Discussed with the patient and all questions fully answered. An After Visit Summary was printed and given to the patient. Patient escorted out and D/C home via private auto.  Laura [name]  10/08/2020 12:13 PM

## 2020-10-10 DIAGNOSIS — M792 Neuralgia and neuritis, unspecified: Secondary | ICD-10-CM | POA: Diagnosis not present

## 2020-10-10 DIAGNOSIS — M5416 Radiculopathy, lumbar region: Secondary | ICD-10-CM | POA: Diagnosis not present

## 2020-10-10 DIAGNOSIS — M47816 Spondylosis without myelopathy or radiculopathy, lumbar region: Secondary | ICD-10-CM | POA: Diagnosis not present

## 2020-10-14 DIAGNOSIS — F411 Generalized anxiety disorder: Secondary | ICD-10-CM | POA: Diagnosis not present

## 2020-10-14 DIAGNOSIS — Z79891 Long term (current) use of opiate analgesic: Secondary | ICD-10-CM | POA: Diagnosis not present

## 2020-10-14 DIAGNOSIS — F331 Major depressive disorder, recurrent, moderate: Secondary | ICD-10-CM | POA: Diagnosis not present

## 2020-10-14 DIAGNOSIS — F332 Major depressive disorder, recurrent severe without psychotic features: Secondary | ICD-10-CM | POA: Diagnosis not present

## 2020-10-14 DIAGNOSIS — F39 Unspecified mood [affective] disorder: Secondary | ICD-10-CM | POA: Diagnosis not present

## 2020-10-30 DIAGNOSIS — M47816 Spondylosis without myelopathy or radiculopathy, lumbar region: Secondary | ICD-10-CM | POA: Diagnosis not present

## 2020-11-04 DIAGNOSIS — M47816 Spondylosis without myelopathy or radiculopathy, lumbar region: Secondary | ICD-10-CM | POA: Diagnosis not present

## 2020-11-11 DIAGNOSIS — M47816 Spondylosis without myelopathy or radiculopathy, lumbar region: Secondary | ICD-10-CM | POA: Diagnosis not present

## 2020-11-14 DIAGNOSIS — M7918 Myalgia, other site: Secondary | ICD-10-CM | POA: Diagnosis not present

## 2020-11-14 DIAGNOSIS — M461 Sacroiliitis, not elsewhere classified: Secondary | ICD-10-CM | POA: Diagnosis not present

## 2020-11-14 DIAGNOSIS — M47816 Spondylosis without myelopathy or radiculopathy, lumbar region: Secondary | ICD-10-CM | POA: Diagnosis not present

## 2020-11-18 DIAGNOSIS — M47816 Spondylosis without myelopathy or radiculopathy, lumbar region: Secondary | ICD-10-CM | POA: Diagnosis not present

## 2020-11-21 DIAGNOSIS — M47816 Spondylosis without myelopathy or radiculopathy, lumbar region: Secondary | ICD-10-CM | POA: Diagnosis not present

## 2020-12-09 DIAGNOSIS — I10 Essential (primary) hypertension: Secondary | ICD-10-CM | POA: Diagnosis not present

## 2020-12-09 DIAGNOSIS — E7800 Pure hypercholesterolemia, unspecified: Secondary | ICD-10-CM | POA: Diagnosis not present

## 2020-12-09 DIAGNOSIS — R350 Frequency of micturition: Secondary | ICD-10-CM | POA: Diagnosis not present

## 2020-12-09 DIAGNOSIS — M549 Dorsalgia, unspecified: Secondary | ICD-10-CM | POA: Diagnosis not present

## 2020-12-09 DIAGNOSIS — F330 Major depressive disorder, recurrent, mild: Secondary | ICD-10-CM | POA: Diagnosis not present

## 2020-12-09 DIAGNOSIS — K5900 Constipation, unspecified: Secondary | ICD-10-CM | POA: Diagnosis not present

## 2020-12-09 DIAGNOSIS — E039 Hypothyroidism, unspecified: Secondary | ICD-10-CM | POA: Diagnosis not present

## 2020-12-24 DIAGNOSIS — N390 Urinary tract infection, site not specified: Secondary | ICD-10-CM | POA: Diagnosis not present

## 2020-12-24 DIAGNOSIS — R350 Frequency of micturition: Secondary | ICD-10-CM | POA: Diagnosis not present

## 2020-12-24 DIAGNOSIS — R4789 Other speech disturbances: Secondary | ICD-10-CM | POA: Diagnosis not present

## 2020-12-26 ENCOUNTER — Other Ambulatory Visit: Payer: Self-pay | Admitting: Family Medicine

## 2020-12-30 ENCOUNTER — Other Ambulatory Visit: Payer: Self-pay | Admitting: Cardiovascular Disease

## 2020-12-31 ENCOUNTER — Other Ambulatory Visit: Payer: Self-pay

## 2020-12-31 ENCOUNTER — Other Ambulatory Visit: Payer: Self-pay | Admitting: Family Medicine

## 2020-12-31 ENCOUNTER — Ambulatory Visit (INDEPENDENT_AMBULATORY_CARE_PROVIDER_SITE_OTHER): Payer: Medicare Other | Admitting: Cardiovascular Disease

## 2020-12-31 ENCOUNTER — Encounter: Payer: Self-pay | Admitting: Cardiovascular Disease

## 2020-12-31 ENCOUNTER — Other Ambulatory Visit: Payer: Self-pay | Admitting: Cardiovascular Disease

## 2020-12-31 DIAGNOSIS — I700 Atherosclerosis of aorta: Secondary | ICD-10-CM

## 2020-12-31 DIAGNOSIS — R4789 Other speech disturbances: Secondary | ICD-10-CM

## 2020-12-31 DIAGNOSIS — I493 Ventricular premature depolarization: Secondary | ICD-10-CM

## 2020-12-31 DIAGNOSIS — I10 Essential (primary) hypertension: Secondary | ICD-10-CM | POA: Diagnosis not present

## 2020-12-31 DIAGNOSIS — G4719 Other hypersomnia: Secondary | ICD-10-CM

## 2020-12-31 DIAGNOSIS — E785 Hyperlipidemia, unspecified: Secondary | ICD-10-CM

## 2020-12-31 DIAGNOSIS — G4733 Obstructive sleep apnea (adult) (pediatric): Secondary | ICD-10-CM

## 2020-12-31 DIAGNOSIS — I428 Other cardiomyopathies: Secondary | ICD-10-CM | POA: Diagnosis not present

## 2020-12-31 MED ORDER — HYDRALAZINE HCL 25 MG PO TABS: 25.0000 mg | ORAL_TABLET | Freq: Three times a day (TID) | ORAL | 3 refills | Status: DC

## 2020-12-31 NOTE — Patient Instructions (Signed)
Medication Instructions:  Start Hydralazine 25 mg (1 Tablet 3 Times Daily) *If you need a refill on your cardiac medications before your next appointment, please call your pharmacy*   Lab Work: No Labs If you have labs (blood work) drawn today and your tests are completely normal, you will receive your results only by: [place] (if you have MyChart) OR A paper copy in the mail If you have any lab test that is abnormal or we need to change your treatment, we will call you to review the results.   Testing/Procedures: No Testing    Follow-Up: At [facility], you and your health needs are our priority.  As part of our continuing mission to provide you with exceptional heart care, we have created designated Provider Care Teams.  These Care Teams include your primary Cardiologist (physician) and Advanced Practice Providers (APPs -  Physician Assistants and Nurse Practitioners) who all work together to provide you with the care you need, when you need it.  We recommend signing up for the patient portal called "MyChart".  Sign up information is provided on this After Visit Summary.  MyChart is used to connect with patients for Virtual Visits (Telemedicine).  Patients are able to view lab/test results, encounter notes, upcoming appointments, etc.  Non-urgent messages can be sent to your provider as well.   To learn more about what you can do with MyChart, go to [email or web address].    Your next appointment:   1 year(s)  The format for your next appointment:   In Person  Provider:   Shelva Majestic, MD

## 2020-12-31 NOTE — Progress Notes (Signed)
Cardiology Office Note    Date:  01/01/2021   ID:  Laura [name], DOB June 18, 1946, MRN [id number]  PCP:  Shirline Frees, MD  Cardiologist:  Shelva Majestic, MD   F/U evaluation; former patient of Dr. Tollie Eth.  History of Present Illness:  Laura [name] is a 75 y.o. female who presents for a 20-monthfollow-up evaluation.    Laura [name] a remote patient of Dr. CSallyanne Kuster  Apparently, she has a history of left bundle branch block, and apparently was felt to have a Takotsubo cardiomyopathy after she presented with a non-ST segment delegation MI with a peak troponin of 5.33.  She underwent cardiac catheterization by Dr. CSallyanne Kusteron December 09, 2009 and was found to have normal coronary arteries and classic findings of Takotsubo cardiomyopathy with apical ballooning of the left ventricle.  Her EF by echo was 30 to 35%.  At that time she was started on lisinopril, Lasix and low-dose metoprolol.  She also has a history of asthma and bronchitis.  Over the past several years she has been followed by Dr. STollie Ethfor cardiology care.  She has a history of hypertension and has been on irbesartan 300 mg, hydralazine 50 mg twice a day, metoprolol succinate 12.5 mg daily.  She has a history of hypothyroidism and has been on levothyroxine 88 mcg.  There also was a history of some anxiety for which she had been given diazepam as well as citalopram.  I initially saw her in March 2020. Upon further questioning, the patient has a history of severe sleep apnea and states that she is had 3 sleep studies with her last being many years ago by Dr. YAnnamaria Boots  She reports an AHI initially of 66.  She had been on CPAP therapy remotely remembers a pressure of 14 cm but then became not tolerant to CPAP mask.  As result she later obtained an oral appliance by Dr. MOneal Grout  However she has had difficulty with some teeth fractures and and has not used her oral appliance in well over a year.  She admits to snoring.   Her sleep is nonrestorative.  She was evaluated by Dr. MChristinia Gullyfor cough bronchitis/asthma.  [facility]  She was also recommended to take both pantoprazole and famotidine for GERD and its effect on her cough.    When I initially saw her in March 2020, I spent considerable time with her discussing her untreated sleep apnea and the significant improvements in mask and CPAP technology since her initial trial.  I recommended she undergo a follow-up echo Doppler study.  She was on atorvastatin for hyperlipidemia.  Due to the COVID pandemic, she never underwent a sleep study and upon further questioning today does not want to have one as long as she would have to have a COVID test.  She underwent an echo Doppler study on December 09, 2018 which revealed an EF of 45% with septal lateral dyssynchrony consistent with left bundle branch block.  There was mild aortic sclerosis without stenosis.  She was seen by ADoreene Adason January 31, 2019 and at that time was having palpitations.  She admitted to being under significant recent stress.  There was some discussion concerning EP evaluation of her PVCs but she declined.   She underwent a home sleep study on May 03, 2019 which showed moderate overall sleep apnea with an AHI of 26.9/h but since it was a home study the  severity of sleep apnea during REM sleep was unable to be assessed.  Of note was that she had severe oxygen desaturation to a nadir of 74%.  She snored 32.3% during sleep.  She was referred to undergo a CPAP titration study which was done on August 10, 2019.  Pressure was titrated to 11 cm with excellent result with O2 saturation 94%.  Apparently, she received a new CPAP machine with set up date September 25, 2019.  [employer] was her DME company.  Apparently, the patient never used the machine until May 25.  She had been contacted by the DME company of the importance of meeting compliance and that she will need to meet compliance by the  90-day window.  If she had continued use consistently over the past month she would have met compliance but unfortunately usage was only 17 of 30 days.  At 11 cm, pressure was still elevated at 13.2.  Her machine was picked up by her DME company and she no longer has therapy.  She continues to notice occasional palpitations.  She denies chest pain or shortness of breath.  At her last evaluation with me in June 2021, I spent a significant amount of time with her.  She was considering the possibility of buying a CPAP machine on the Internet since her machine had been returned due to poor compliance.  In the past she had failed because demised oral appliance and had been treated by Dr. Ron Parker.  Her EKG showed some PVCs occurring frequently and I recommended further titration of metoprolol succinate to 50 mg daily.  She continues to have daytime sleepiness and was taken Nuvigil on a as needed basis.  I saw her in September 2021 at which time she continued to have poor sleep.  She was continued to have daytime sleepiness. She has done some research on her own and would like to be evaluated for inspire technology.  She has subsequently seen Dr. Redmond Baseman of ENT. She continues to experience some shortness of breath but this seems to occur when she does not eat and seems to improve when she does eat.  She is not very active.  Her last echo Doppler study in February 2020 showed an EF of 45% with septal lateral dyssynchrony consistent with her left bundle branch block.   When I last saw her in August 30, 2020 she was having issues with inflammation.  She has been evaluated by orthopedist.  She has had balance issues.  She denies chest pain.  She underwent a CT angio of her chest and abdomen due to significant discomfort in November 2021.  There was no evidence for thoracoabdominal aortic aneurysm or dissection.  There was no evidence for acute intramural hematoma within the thoracic aorta.  There were no acute findings in the  chest abdomen or pelvis.  There was evidence for left colonic diverticulosis without diverticulitis and she had aortic atherosclerosis.  During her evaluation, her blood pressure was elevated despite taking irbesartan 3 mg daily, metoprolol all succinate 50 mg apparently she had only been taking hydralazine 50 mg once a day.  She remotely had been on spironolactone but was no longer on this.  I discussed with her the importance of at least taking hydralazine twice a day.  I recommended further titration of metoprolol succinate to 75 mg.  She had seen Dr. Redmond Baseman in the past for possible consideration for inspire technology with her untreated sleep apnea.  She was taking new vigil rarely for daytime  sleepiness.  Since I last saw her, she states she has been having difficulty in getting the pill count right when she gets her prescription filled.  She does not like cutting hydralazine in half and has been taking 50 mg in the morning and 25 mg in the evening.  She has continued to be on irbesartan 300 mg a day in addition to metoprolol succinate 75 mg daily for hypertension.  She continues to be on atorvastatin 40 mg for hyperlipidemia.  She states her sleep has been poor mainly due to back issues which make it difficult for her to rest comfortably.  She also complains of some left eye issues and has seen several ophthalmologists.  She presents for reevaluation.  Past Medical History:  Diagnosis Date   Anemia    Anxiety    Asthma    related to sesonal allergies   Chronic combined systolic and diastolic CHF, NYHA class 2 ([place]) CARDIOLOGIST-  DR [id number]   Depression    History of kidney stones    History of non-ST elevation myocardial infarction (NSTEMI) JUNE 2011   SECONDARY TO TAKOTSUDO SYNDROME (CARDIAC CATH NORMAL)   Hyperlipemia    Hypertension    Hypoglycemia    Hypothyroidism    LBBB (left bundle branch block)    Left ventricular ejection fraction less than 40%    38% PER CARDIOLOGIST NOTE (DR  CROITORU)   Memory loss    Mood swings    Nonischemic dilated cardiomyopathy ([place])    MODERATELY DEPRESSED LVF;EF 35-45% by Echo 05/27/11   OSA (obstructive sleep apnea) MODERATE PER STUDY 2005   CPAP NONCOMPLIANT   Seasonal allergies    SUI (stress urinary incontinence, female)     Past Surgical History:  Procedure Laterality Date   ABDOMINAL HYSTERECTOMY  1985   partial   CARDIAC CATHETERIZATION  09-04-1999;  08/25/2004;   12/09/2009  DR Sallyanne Kuster   NORMAL CORONARIES/  APICAL BALLOONING OF LV CONSISTENT WITH TAKOTSUBO SYMPTOMS/ EF 30-35%   CATARACT EXTRACTION W/ INTRAOCULAR LENS  IMPLANT, BILATERAL     CHOLECYSTECTOMY N/A 09/29/2012   Procedure: LAPAROSCOPIC CHOLECYSTECTOMY WITH INTRAOPERATIVE CHOLANGIOGRAM;  Surgeon: Adin Hector, MD;  Location: [facility];  Service: General;  Laterality: N/A;   CYSTOSCOPY N/A 09/19/2012   Procedure: Erlene Quan;  Surgeon: Bernestine Amass, MD;  [facility];  Service: Urology;  Laterality: N/A;   KNEE ARTHROSCOPY W/ MENISCECTOMY  07-27-2011   MEDIAL AND LATERAL   NASAL SEPTUM SURGERY  1980's   PUBOVAGINAL SLING N/A 09/19/2012   Procedure: SUBURETHRAL Janyth Pupa;  Surgeon: Bernestine Amass, MD;  Location: [facility];  Service: Urology;  Laterality: N/A;   RIGHT URETEROSCOPIC STONE EXTRACTION  08-31-2000   TRANSTHORACIC ECHOCARDIOGRAM  05-27-2011  DR CROITORU   MODERATELY DEPRESSED LVF DUE TO GLOBAL HYPOKINESIS AND MARKED SYSTOLIC ASYNCHRONY/ EF 26%/ MILD LEFT ATRIAL DILATATION    Current Medications: Outpatient Medications Prior to Visit  Medication Sig Dispense Refill   atorvastatin (LIPITOR) 80 MG tablet TAKE 1/2 TABLET BY MOUTH DAILY FOR CHOLESTEROL 45 tablet 3   diazepam (VALIUM) 5 MG tablet Take 1 tablet (5 mg total) by mouth every 12 (twelve) hours as needed for muscle spasms. 10 tablet 0   HYDROcodone-acetaminophen (NORCO/VICODIN) 5-325 MG tablet Take 1 tablet by mouth 3 (three) times daily as needed.      irbesartan (AVAPRO) 300 MG tablet Take 300 mg by mouth daily.     levothyroxine (SYNTHROID, LEVOTHROID) 88 MCG tablet Take  88 mcg by mouth daily before breakfast.     NUVIGIL 250 MG tablet Take 250 mg by mouth as needed.  3   gabapentin (NEURONTIN) 100 MG capsule Take by mouth.     hydrALAZINE (APRESOLINE) 50 MG tablet Take 1 tablet (50 mg) in the AM, and 0.5 tablet (25 mg) in the PM. 120 tablet 1   metoprolol succinate (TOPROL-XL) 50 MG 24 hr tablet Take 1.5 tablets (75 mg total) by mouth daily. 90 tablet 3   spironolactone (ALDACTONE) 25 MG tablet TAKE 1/2 TABLET BY MOUTH DAILY 45 tablet 3   No facility-administered medications prior to visit.     Allergies:   Codeine, Ace inhibitors, Citalopram, Fetzima [levomilnacipran], Lasix [furosemide], Nsaids, Sulfamethoxazole-trimethoprim, and Xanax xr [alprazolam er]   Social History   Socioeconomic History   Marital status: Married    Spouse name: Laura [name]   Number of children: 2   Years of education: 12+   Highest education level: Some college, no degree  Occupational History   Occupation: Retired  Tobacco Use   Smoking status: Never   Smokeless tobacco: Never  Substance and Sexual Activity   Alcohol use: No    Alcohol/week: 0.0 standard drinks   Drug use: No   Sexual activity: Never    Birth control/protection: Post-menopausal  Other Topics Concern   Not on file  Social History Narrative   Lives at home with husband.   Right-handed.   Drinks 2-3 cups caffeine per day.   No regular exercise.   Social Determinants of Health   Financial Resource Strain: Not on file  Food Insecurity: Not on file  Transportation Needs: Not on file  Physical Activity: Not on file  Stress: Not on file  Social Connections: Not on file     She is married for over 55 years to my patient Mr. Laura [name].  She has 2 children and 1 grandchild.  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  She completed 12th  grade of education.  She does not exercise.  She does not drink alcohol.  Family History:  The patient's family history includes Fibromyalgia in her brother; Heart attack in her father and paternal grandfather; Heart disease in her brother; Hypertension in her brother and mother; Pneumonia in her mother; Pulmonary embolism in her brother.   Her mother died at age 26 secondary to anaphylaxis.  Her father died at age 53 with a heart attack.  A brother died at age 81 with heart problems.  She has a living sister age 69.  ROS General: Negative; No fevers, chills, or night sweats;  HEENT: Complains of left eye difficulty and has seen several ophthalmologist and was told that her vision is fine. No sinus congestion, difficulty swallowing Pulmonary: Positive for cough, asthma and bronchitis Cardiovascular: No chest pain, positive for palpitations GI: Negative; No nausea, vomiting, diarrhea, or abdominal pain GU: Negative; No dysuria, hematuria, or difficulty voiding Musculoskeletal: Negative; no myalgias, joint pain, or weakness Hematologic/Oncology: Negative; no easy bruising, bleeding Endocrine: Negative; no heat/cold intolerance; no diabetes Neuro: Negative; no changes in balance, headaches Skin: Negative; No rashes or skin lesions Psychiatric: Negative; No behavioral problems, depression Sleep: History of OSA, previously documented to be severe.  Remotely used CPAP and remotely had a customized oral appliance.  Currently snores, nocturia 2 times per night, nonrestorative sleep, and she experiences daytime sleepiness.  norestless legs, hypnogognic hallucinations, no cataplexy; despite receiving a new machine on September 25, 2019 she  never started to use therapy until Nov 21, 2019 and unfortunately due to failure to meet compliance her machine was picked up by the DME company. Other comprehensive 14 point system review is negative.   PHYSICAL EXAM:   VS:  BP 130/71   Pulse 76   Ht '5\' 3"'  (1.6 m)    Wt 175 lb (79.4 kg)   SpO2 97%   BMI 31.00 kg/m     Repeat blood pressure by me was 130/70  Wt Readings from Last 3 Encounters:  12/31/20 175 lb (79.4 kg)  08/30/20 178 lb 9.6 oz (81 kg)  05/12/20 165 lb (74.8 kg)     General: Alert, oriented, no distress.  Skin: normal turgor, no rashes, warm and dry HEENT: Normocephalic, atraumatic. Pupils equal round and reactive to light; sclera anicteric; extraocular muscles intact;  Nose without nasal septal hypertrophy Mouth/Parynx benign; Mallinpatti scale 3 Neck: No JVD, no carotid bruits; normal carotid upstroke Lungs: clear to ausculatation and percussion; no wheezing or rales Chest wall: without tenderness to palpitation Heart: PMI not displaced, RRR, s1 s2 normal, 1/6 systolic murmur, no diastolic murmur, no rubs, gallops, thrills, or heaves Abdomen: soft, nontender; no hepatosplenomehaly, BS+; abdominal aorta nontender and not dilated by palpation. Back: no CVA tenderness Pulses 2+ Musculoskeletal: full range of motion, normal strength, no joint deformities Extremities: no clubbing cyanosis or edema, Homan's sign negative  Neurologic: grossly nonfocal; Cranial nerves grossly wnl Psychologic: Normal mood and affect   Studies/Labs Reviewed:   ECG (independently read by me):  NSR at 76, PVC, LBBB  August 30, 2020 ECG (independently read by me):  Sinus rhythm at 81, occasional PVCs, LBBB  December 21, 2019 ECG (independently read by me): Sinus rhythm at 78 bpm with frequent PVCs.  Left bundle branch block with repolarization changes.  QTc interval 487 ms.  March 15, 2019 ECG (independently read by me): Sinus rhythm at 68 bpm, PVC, left bundle branch block with repolarization changes.  September 21, 2018 ECG (independently read by me): Sinus rhythm at 73 bpm with PACs, left bundle branch block with repolarization changes.  Recent Labs: BMP Latest Ref Rng & Units 08/30/2020 05/12/2020 12/19/2018  Glucose 65 - 99 mg/dL 98 110(H) 89  BUN 8  - 27 mg/dL '10 11 12  ' Creatinine 0.57 - 1.00 mg/dL 0.90 0.91 0.83  BUN/Creat Ratio 12 - 28 11(L) - 14  Sodium 134 - 144 mmol/L 144 138 143  Potassium 3.5 - 5.2 mmol/L 3.8 3.8 4.1  Chloride 96 - 106 mmol/L 106 107 105  CO2 20 - 29 mmol/L '24 24 25  ' Calcium 8.7 - 10.3 mg/dL 9.6 9.1 9.4     Hepatic Function Latest Ref Rng & Units 08/30/2020 08/03/2017 10/25/2015  Total Protein 6.0 - 8.5 g/dL 6.8 7.6 7.0  Albumin 3.7 - 4.7 g/dL 4.3 4.2 4.1  AST 0 - 40 IU/L '20 26 24  ' ALT 0 - 32 IU/L '19 27 24  ' Alk Phosphatase 44 - 121 IU/L 68 81 78  Total Bilirubin 0.0 - 1.2 mg/dL 0.4 0.6 0.3  Bilirubin, Direct 0.0 - 0.3 mg/dL - - -    CBC Latest Ref Rng & Units 05/12/2020 09/19/2018 08/03/2017  WBC 4.0 - 10.5 K/uL 6.7 12.6(H) -  Hemoglobin 12.0 - 15.0 g/dL 12.3 13.0 13.9  Hematocrit 36.0 - 46.0 % 38.9 39.0 41.0  Platelets 150 - 400 K/uL 214 262.0 -   Lab Results  Component Value Date   MCV 81.4 05/12/2020   MCV 74.9 (  L) 09/19/2018   MCV 78.5 08/03/2017   Lab Results  Component Value Date   TSH 1.02 09/19/2018   Lab Results  Component Value Date   HGBA1C 6.0 (H) 12/19/2018     BNP    Component Value Date/Time   BNP 21.7 05/15/2015 1339    ProBNP    Component Value Date/Time   PROBNP 65.0 09/19/2018 1532     Lipid Panel     Component Value Date/Time   CHOL 158 12/19/2018 1358   TRIG 113 12/19/2018 1358   HDL 57 12/19/2018 1358   CHOLHDL 2.8 12/19/2018 1358   CHOLHDL 5.1 08/23/2014 1623   VLDL 75 (H) 08/23/2014 1623   LDLCALC 78 12/19/2018 1358     RADIOLOGY: No results found.   Additional studies/ records that were reviewed today include:  I have reviewed the records of Dr. Viona Gilmore. Tollie Eth.   ECHO Doppler study was in August 2018 which showed moderate concentric LVH with abnormal septal wall motion due to left bundle branch block.  EF 50%.  Moderate LA dilation.  Mild to moderate MR.  Carotid duplex imaging from April 01, 2017: 1 to 39% right and left proximal internal  carotid stenoses  Lexiscan Myoview studies March 25, 2017: Normal perfusion with EF at 43% with abnormal septal motion consistent with left bundle branch block.  The visual EF appeared higher.  It was felt that a low EF was affected by the bundle branch block and  gating artifact due to PVCs.  ECHO 12/09/2018 IMPRESSIONS   1. The left ventricle has a visually estimated ejection fraction of 45%.  The cavity size was normal. There is moderately increased left ventricular  wall thickness. Left ventricular diastolic Doppler parameters are  consistent with impaired relaxation.  Left ventrical global hypokinesis without regional wall motion  abnormalities. Septal-lateral dyssynchrony consistent with LBBB.   2. The right ventricle has normal systolic function. The cavity was  normal. There is no increase in right ventricular wall thickness.   3. Left atrial size was mildly dilated.   4. No evidence of mitral valve stenosis. Trivial mitral regurgitation.   5. The aortic valve is tricuspid. Mild calcification of the aortic valve.  No stenosis of the aortic valve.   6. The aortic root is normal in size and structure.   7. The IVC was normal in size. No complete TR doppler jet so unable to  estimate PA systolic pressure.    LONG TERM MONITOR 01/19/2020 The patient was monitored from June 28 through January 08, 2019 for 13 days and 12 hours.  The predominant rhythm was sinus rhythm with an average heart rate of 71 bpm.  There was bundle branch block/I VCD.  There were frequent isolated PVCs at 9.5%, occasional couplet at 1.5% and rare triplets less than 1%.   There were rare episodes of ventricular bigeminy and trigeminy.  There was 1 run of irregular ventricular tachycardia lasting 4 beats with an average rate of 128 with a maximum rate at 174.  There were 2 supraventricular tachycardic episodes with the fastest interval 14 beats (average rate 120    ASSESSMENT:    1. Essential hypertension   2.  Nonischemic cardiomyopathy ([place]); history of Takotsubo 2011   3. Hyperlipidemia with target LDL less than 70   4. Aortic atherosclerosis ([employer])   5. OSA (obstructive sleep apnea)   6. Excessive daytime sleepiness   7.  PVCs     PLAN:  Laura [name] is a  75 year-old female who suffered a non-ST segment elevation myocardial infarction in June 2011 at which time she was found to have findings consistent with Takotsubo cardiomyopathy.  EF at that time was 30 to 35%.  Coronary angiography revealed normal coronary arteries.  Her cardiomyopathy was nonischemic.  She was treated with initial medical therapy including ACE inhibition, low-dose beta-blocker therapy as well as diuretic.  Subsequently, she was followed  by Dr. Tollie Eth  An echo Doppler assessment EF was approximately 50%.  She has chronic left bundle branch block.  She has a history of hypertension and  has been managed with hydralazine 50 mg daily, irbesartan 300 mg, Toprol XL 25 mg in addition to spironolactone 12.5 mg daily.   An echo Doppler study from June 2020  revealed an EF of approximately 45% and findings consistent with her left bundle branch block with septal dyssynergy and grade 1 diastolic dysfunction.  At her last evaluation I recommended titration of her hydralazine to at least twice a day and further titrated her metoprolol succinate.  Apparently she has been taking hydralazine 50 mg in the morning and 25 mg at night.  She has difficulty cutting this pill.  Her blood pressure today is stable and I will change hydralazine from a 50 mg tablet to a 25 mg tablet such that she can take 2 pills in the morning and 1 pill at night.  If blood pressure continues to be elevated 50 twice daily regimen will be necessary.  She continues to be on irbesartan 200 mg daily.  She is now on metoprolol succinate 75 mg daily.  She has rare isolated PVC.  If ectopy increases further titration of metoprolol 100 mg daily can be prescribed.  She  continues to have difficulty with sleep which she blames mainly due to her back discomfort.  In the past she was noncompliant with CPAP therapy and also failed an attempt at oral appliance.  She had been evaluated initially by Dr. Redmond Baseman for consideration of inspire technology and I have suggested that she can follow-up with him regarding this treatment modality.  She continues to be on atorvastatin 80 mg for hyperlipidemia.  Dr. Shirline Frees is her primary MD who checks laboratory.  She was found to have aortic atherosclerosis on recent CT imaging.  Target LDL is less than 70.  She continues to be on levothyroxine for hypothyroidism and currently is on 88 mcg daily.  She has a prescription for nuvigil 250 mg to take on a as needed basis and she very rarely takes this.  I will see her in 1 year for follow-up evaluation or sooner as needed   Medication Adjustments/Labs and Tests Ordered: Current medicines are reviewed at length with the patient today.  Concerns regarding medicines are outlined above.  Medication changes, Labs and Tests ordered today are listed in the Patient Instructions below. Patient Instructions  Medication Instructions:  Start Hydralazine 25 mg (1 Tablet 3 Times Daily) *If you need a refill on your cardiac medications before your next appointment, please call your pharmacy*   Lab Work: No Labs If you have labs (blood work) drawn today and your tests are completely normal, you will receive your results only by: [place] (if you have MyChart) OR A paper copy in the mail If you have any lab test that is abnormal or we need to change your treatment, we will call you to review the results.   Testing/Procedures: No Testing    Follow-Up: At [facility], you and your health needs are our priority.  As part of our continuing mission to provide you with exceptional heart care, we have created designated Provider Care Teams.  These Care Teams include your primary  Cardiologist (physician) and Advanced Practice Providers (APPs -  Physician Assistants and Nurse Practitioners) who all work together to provide you with the care you need, when you need it.  We recommend signing up for the patient portal called "MyChart".  Sign up information is provided on this After Visit Summary.  MyChart is used to connect with patients for Virtual Visits (Telemedicine).  Patients are able to view lab/test results, encounter notes, upcoming appointments, etc.  Non-urgent messages can be sent to your provider as well.   To learn more about what you can do with MyChart, go to [email or web address].    Your next appointment:   1 year(s)  The format for your next appointment:   In Person  Provider:   Shelva Majestic, MD       Signed, Shelva Majestic, MD  01/01/2021 8:54 AM    [facility] [address] [place], [place], [place]  [address] Phone: [phone]

## 2021-01-01 ENCOUNTER — Encounter: Payer: Self-pay | Admitting: Cardiovascular Disease

## 2021-01-03 DIAGNOSIS — M7918 Myalgia, other site: Secondary | ICD-10-CM | POA: Diagnosis not present

## 2021-01-03 DIAGNOSIS — M542 Cervicalgia: Secondary | ICD-10-CM | POA: Diagnosis not present

## 2021-01-03 DIAGNOSIS — M47816 Spondylosis without myelopathy or radiculopathy, lumbar region: Secondary | ICD-10-CM | POA: Diagnosis not present

## 2021-01-04 ENCOUNTER — Ambulatory Visit
Admission: RE | Admit: 2021-01-04 | Discharge: 2021-01-04 | Disposition: A | Discharge status: Home / Self Care | Payer: Medicare Other | Source: Ambulatory Visit | Attending: Family Medicine | Admitting: Family Medicine

## 2021-01-04 ENCOUNTER — Other Ambulatory Visit: Payer: Self-pay

## 2021-01-04 DIAGNOSIS — R410 Disorientation, unspecified: Secondary | ICD-10-CM | POA: Diagnosis not present

## 2021-01-04 DIAGNOSIS — R4789 Other speech disturbances: Secondary | ICD-10-CM

## 2021-01-04 DIAGNOSIS — R5383 Other fatigue: Secondary | ICD-10-CM | POA: Diagnosis not present

## 2021-01-22 DIAGNOSIS — E7800 Pure hypercholesterolemia, unspecified: Secondary | ICD-10-CM | POA: Diagnosis not present

## 2021-01-22 DIAGNOSIS — M545 Low back pain, unspecified: Secondary | ICD-10-CM | POA: Diagnosis not present

## 2021-01-22 DIAGNOSIS — R350 Frequency of micturition: Secondary | ICD-10-CM | POA: Diagnosis not present

## 2021-01-22 DIAGNOSIS — I10 Essential (primary) hypertension: Secondary | ICD-10-CM | POA: Diagnosis not present

## 2021-01-22 DIAGNOSIS — G8929 Other chronic pain: Secondary | ICD-10-CM | POA: Diagnosis not present

## 2021-01-22 DIAGNOSIS — R479 Unspecified speech disturbances: Secondary | ICD-10-CM | POA: Diagnosis not present

## 2021-01-22 DIAGNOSIS — E039 Hypothyroidism, unspecified: Secondary | ICD-10-CM | POA: Diagnosis not present

## 2021-01-22 DIAGNOSIS — F331 Major depressive disorder, recurrent, moderate: Secondary | ICD-10-CM | POA: Diagnosis not present

## 2021-01-24 DIAGNOSIS — N390 Urinary tract infection, site not specified: Secondary | ICD-10-CM | POA: Diagnosis not present

## 2021-01-24 DIAGNOSIS — Z882 Allergy status to sulfonamides status: Secondary | ICD-10-CM | POA: Diagnosis not present

## 2021-01-24 DIAGNOSIS — K5732 Diverticulitis of large intestine without perforation or abscess without bleeding: Secondary | ICD-10-CM | POA: Diagnosis not present

## 2021-01-24 DIAGNOSIS — K5730 Diverticulosis of large intestine without perforation or abscess without bleeding: Secondary | ICD-10-CM | POA: Diagnosis not present

## 2021-01-24 DIAGNOSIS — Z888 Allergy status to other drugs, medicaments and biological substances status: Secondary | ICD-10-CM | POA: Diagnosis not present

## 2021-01-24 DIAGNOSIS — R1030 Lower abdominal pain, unspecified: Secondary | ICD-10-CM | POA: Diagnosis not present

## 2021-01-24 DIAGNOSIS — R39198 Other difficulties with micturition: Secondary | ICD-10-CM | POA: Diagnosis not present

## 2021-01-24 DIAGNOSIS — K5792 Diverticulitis of intestine, part unspecified, without perforation or abscess without bleeding: Secondary | ICD-10-CM | POA: Diagnosis not present

## 2021-01-24 DIAGNOSIS — Z79899 Other long term (current) drug therapy: Secondary | ICD-10-CM | POA: Diagnosis not present

## 2021-01-29 ENCOUNTER — Ambulatory Visit: Payer: Self-pay | Admitting: Urology

## 2021-01-29 DIAGNOSIS — K5904 Chronic idiopathic constipation: Secondary | ICD-10-CM | POA: Diagnosis not present

## 2021-01-29 DIAGNOSIS — B9689 Other specified bacterial agents as the cause of diseases classified elsewhere: Secondary | ICD-10-CM | POA: Diagnosis not present

## 2021-01-29 DIAGNOSIS — M47816 Spondylosis without myelopathy or radiculopathy, lumbar region: Secondary | ICD-10-CM | POA: Diagnosis not present

## 2021-01-29 DIAGNOSIS — N390 Urinary tract infection, site not specified: Secondary | ICD-10-CM | POA: Diagnosis not present

## 2021-01-29 DIAGNOSIS — K5792 Diverticulitis of intestine, part unspecified, without perforation or abscess without bleeding: Secondary | ICD-10-CM | POA: Diagnosis not present

## 2021-02-05 DIAGNOSIS — N390 Urinary tract infection, site not specified: Secondary | ICD-10-CM | POA: Diagnosis not present

## 2021-02-05 DIAGNOSIS — K5792 Diverticulitis of intestine, part unspecified, without perforation or abscess without bleeding: Secondary | ICD-10-CM | POA: Diagnosis not present

## 2021-02-19 DIAGNOSIS — M47816 Spondylosis without myelopathy or radiculopathy, lumbar region: Secondary | ICD-10-CM | POA: Diagnosis not present

## 2021-02-25 NOTE — Progress Notes (Signed)
02/26/21 1:53 PM   Laura [name] 1945-12-26 [id number]  Referring provider:  Shirline Frees, MD [place],  [place] [address] Chief Complaint  Patient presents with   Urinary Tract Infection     HPI: Laura [name] is a 75 y.o.female who presents today for further evaluation of recurrent UTIs, pain, and difficulty urinating.   She was seen in the ED on 01/24/2021. She was diagnosed with diverticulitis. Her WBC count was 10.3. Renal function and LFTs were normal. Urinalysis was positive. She was given Augmentin. On 01/29/2021 she was she by Rolene Course PA, during this visit she was experincing constipation as well as trouble urinating.   She has had recurrent UTIs since June, and has had courses of Bactrim DS, doxycycline, and Macrobid.   On 02/05/2021 she was seen by Dr Shirline Frees and was found to have a UTI she was placed on Cefuroxime axetil tablet, she was referred to urology.   On 01/24/2021 CT of abdomen and pelvis with contrast at [facility] that showed wall thickening and stranding around the proximal sigmoid colon,  acute diverticulitis but the bladder was normal no abcess or preforation.   She had a personal history of incontinence and was followed by Dr.Grapey She is also s/p hysterectomy for bengin causes.    PVR today is 30 mL   Urinalysis today showed trace leukocytes , >2 wbc, and few bacteria   She reports today that she had experienced constipation and urinary hesitancy that started in July. She states she experienced discharge, burning and itching in vaginal area and had a yeast infection. She was placed on medicine for this an finished it a week ago.   She states today that she has hesitancy urinating. She is still experiencing discharge.    PMH: Past Medical History:  Diagnosis Date   Anemia    Anxiety    Asthma    related to sesonal allergies   Chronic combined systolic and diastolic CHF, NYHA class 2 ([place]) CARDIOLOGIST-  DR  [id number]   Depression    History of kidney stones    History of non-ST elevation myocardial infarction (NSTEMI) JUNE 2011   SECONDARY TO TAKOTSUDO SYNDROME (CARDIAC CATH NORMAL)   Hyperlipemia    Hypertension    Hypoglycemia    Hypothyroidism    LBBB (left bundle branch block)    Left ventricular ejection fraction less than 40%    38% PER CARDIOLOGIST NOTE (DR CROITORU)   Memory loss    Mood swings    Nonischemic dilated cardiomyopathy ([place])    MODERATELY DEPRESSED LVF;EF 35-45% by Echo 05/27/11   OSA (obstructive sleep apnea) MODERATE PER STUDY 2005   CPAP NONCOMPLIANT   Seasonal allergies    SUI (stress urinary incontinence, female)     Surgical History: Past Surgical History:  Procedure Laterality Date   ABDOMINAL HYSTERECTOMY  1985   partial   CARDIAC CATHETERIZATION  09-04-1999;  08/25/2004;   12/09/2009  DR CROITORU   NORMAL CORONARIES/  APICAL BALLOONING OF LV CONSISTENT WITH TAKOTSUBO SYMPTOMS/ EF 30-35%   CATARACT EXTRACTION W/ INTRAOCULAR LENS  IMPLANT, BILATERAL     CHOLECYSTECTOMY N/A 09/29/2012   Procedure: LAPAROSCOPIC CHOLECYSTECTOMY WITH INTRAOPERATIVE CHOLANGIOGRAM;  Surgeon: Adin Hector, MD;  Location: [facility];  Service: General;  Laterality: N/A;   CYSTOSCOPY N/A 09/19/2012   Procedure: Erlene Quan;  Surgeon: Bernestine Amass, MD;  Location: [facility];  Service: Urology;  Laterality: N/A;   KNEE  ARTHROSCOPY W/ MENISCECTOMY  07-27-2011   MEDIAL AND LATERAL   NASAL SEPTUM SURGERY  1980's   PUBOVAGINAL SLING N/A 09/19/2012   Procedure: SUBURETHRAL Janyth Pupa;  Surgeon: Bernestine Amass, MD;  Location: [facility];  Service: Urology;  Laterality: N/A;   RIGHT URETEROSCOPIC STONE EXTRACTION  08-31-2000   TRANSTHORACIC ECHOCARDIOGRAM  05-27-2011  DR CROITORU   MODERATELY DEPRESSED LVF DUE TO GLOBAL HYPOKINESIS AND MARKED SYSTOLIC ASYNCHRONY/ EF 64%/ MILD LEFT ATRIAL DILATATION    Home Medications:  Allergies as of 02/26/2021        Reactions   Codeine Anaphylaxis, Hives, Other (See Comments)   Headache. Daughter reported that it caused her throat to swell up    Ciprofloxacin Nausea And Vomiting   Ace Inhibitors Other (See Comments)   Unknown- it "didn't agree with her"    Citalopram    Fatigue   Fetzima [levomilnacipran] Other (See Comments)   "Talking out of my head"   Lasix [furosemide] Other (See Comments)   HEADACHE   Nsaids Other (See Comments)   Told not to take NSAIDs because of her heart    Sulfamethoxazole-trimethoprim Other (See Comments)   NERVOUS AND DISORENTED   Xanax Starla Link Er] Other (See Comments)   confusion        Medication List        Accurate as of February 26, 2021  1:53 PM. If you have any questions, ask your nurse or doctor.          atorvastatin 80 MG tablet Commonly known as: LIPITOR TAKE 1/2 TABLET BY MOUTH DAILY FOR CHOLESTEROL   bumetanide 1 MG tablet Commonly known as: BUMEX ALTERNATE 1 T WITH 2 TS   diazepam 5 MG tablet Commonly known as: VALIUM Take 1 tablet (5 mg total) by mouth every 12 (twelve) hours as needed for muscle spasms.   hydrALAZINE 25 MG tablet Commonly known as: APRESOLINE Take 1 tablet (25 mg total) by mouth 3 (three) times daily.   HYDROcodone-acetaminophen 5-325 MG tablet Commonly known as: NORCO/VICODIN Take 1 tablet by mouth 3 (three) times daily as needed.   irbesartan 300 MG tablet Commonly known as: AVAPRO Take 300 mg by mouth daily.   levothyroxine 88 MCG tablet Commonly known as: SYNTHROID Take 88 mcg by mouth daily before breakfast.   metoprolol succinate 50 MG 24 hr tablet Commonly known as: TOPROL-XL TAKE ONE AND ONE-HALF TABLETS BY MOUTH DAILY   Nuvigil 250 MG tablet Generic drug: Armodafinil Take 250 mg by mouth as needed.        Allergies:  Allergies  Allergen Reactions   Codeine Anaphylaxis, Hives and Other (See Comments)    Headache. Daughter reported that it caused her throat to swell up     Ciprofloxacin Nausea And Vomiting   Ace Inhibitors Other (See Comments)    Unknown- it "didn't agree with her"    Citalopram     Fatigue   Fetzima [Levomilnacipran] Other (See Comments)    "Talking out of my head"   Lasix [Furosemide] Other (See Comments)    HEADACHE   Nsaids Other (See Comments)    Told not to take NSAIDs because of her heart    Sulfamethoxazole-Trimethoprim Other (See Comments)    NERVOUS AND DISORENTED   Xanax Starla Link Er] Other (See Comments)    confusion    Family History: Family History  Problem Relation Age of Onset   Pneumonia Mother    Hypertension Mother    Heart attack  Father    Fibromyalgia Brother    Pulmonary embolism Brother    Hypertension Brother    Heart disease Brother    Heart attack Paternal Grandfather     Social History:  reports that she has never smoked. She has never used smokeless tobacco. She reports that she does not drink alcohol and does not use drugs.   Physical Exam: BP (!) 148/88   Pulse 92   Ht 5\' 3"  (1.6 m)   Wt 169 lb (76.7 kg)   BMI 29.94 kg/m   Constitutional:  Alert and oriented, No acute distress. HEENT: [place] AT, moist mucus membranes.  Trachea midline, no masses. Cardiovascular: No clubbing, cyanosis, or edema. Respiratory: Normal respiratory effort, no increased work of breathing. Skin: No rashes, bruises or suspicious lesions. Neurologic: Grossly intact, no focal deficits, moving all 4 extremities. Psychiatric: Normal mood and affect.  Laboratory Data:  Lab Results  Component Value Date   CREATININE 0.90 08/30/2020    Lab Results  Component Value Date   HGBA1C 6.0 (H) 12/19/2018    Urinalysis Urinalysis today showed trace leukocytes , >2 wbc, and few bacteria   Pertinent Imaging: Results for orders placed or performed in visit on 02/26/21  BLADDER SCAN AMB NON-IMAGING  Result Value Ref Range   Scan Result 30       Assessment & Plan:    Recurrent UTIs - Counseled her on  preventative steps such as cranberry tablets and yogurt (lactobacillus), as well as increase in water intake.  - Unclear whether it is persistent or recurrent UTI all in the symptom of diverticulitis, given that she is asymptomatic, will give it some time and then reassess whether her symptoms quickly recur or resolve - urinalysis seems promising with no infection - consider cystoscopy if she has another infection  as well as CT abdomen and pelvis scan rule out colovesical fistula -may benefit from topical estrogen but will hold off for now until we sort out the above  2. Vaginal itching  - Prescribed Diflucan for vaginal yeast and itching which is likely due to multiple antibiotics that she has taken    Return in about 1 month (around 03/28/2021) for PVR and UA.  [facility] [address] [place], [place] [address] [phone]

## 2021-02-26 ENCOUNTER — Ambulatory Visit (INDEPENDENT_AMBULATORY_CARE_PROVIDER_SITE_OTHER): Payer: Medicare Other | Admitting: Urology

## 2021-02-26 ENCOUNTER — Encounter: Payer: Self-pay | Admitting: Urology

## 2021-02-26 ENCOUNTER — Other Ambulatory Visit: Payer: Self-pay

## 2021-02-26 VITALS — BP 148/88 | HR 92 | Ht 63.0 in | Wt 169.0 lb

## 2021-02-26 DIAGNOSIS — N390 Urinary tract infection, site not specified: Secondary | ICD-10-CM | POA: Diagnosis not present

## 2021-02-26 DIAGNOSIS — N898 Other specified noninflammatory disorders of vagina: Secondary | ICD-10-CM

## 2021-02-26 LAB — BLADDER SCAN AMB NON-IMAGING: Scan Result: 30

## 2021-02-26 MED ORDER — FLUCONAZOLE 150 MG PO TABS: 150.0000 mg | ORAL_TABLET | Freq: Once | ORAL | 0 refills | Status: AC

## 2021-02-26 NOTE — Patient Instructions (Addendum)
Take daily probiotics (any live active culture or lactobacillus) and cranberry tablets (twice daily)

## 2021-02-28 LAB — MICROSCOPIC EXAMINATION
Epithelial Cells (non renal): >10 /hpf — AB (ref 0–10)
RBC, Urine: NONE SEEN /hpf (ref 0–2)

## 2021-02-28 LAB — URINALYSIS, COMPLETE
Bilirubin, UA: NEGATIVE
Glucose, UA: NEGATIVE
Ketones, UA: NEGATIVE
Nitrite, UA: NEGATIVE
Protein,UA: NEGATIVE
RBC, UA: NEGATIVE
Specific Gravity, UA: 1.01 (ref 1.005–1.030)
Urobilinogen, Ur: 0.2 mg/dL (ref 0.2–1.0)
pH, UA: 6 (ref 5.0–7.5)

## 2021-03-05 ENCOUNTER — Telehealth: Payer: Self-pay

## 2021-03-05 MED ORDER — FLUCONAZOLE 150 MG PO TABS: 150.0000 mg | ORAL_TABLET | Freq: Once | ORAL | 0 refills | Status: AC

## 2021-03-05 NOTE — Telephone Encounter (Signed)
Pt called stating she is still having burning and itching and is asking if we can call in  another diflucan or if there is something else she can take. She denies any urinary symptoms, vaginal discharge, fever, chills, or any other symptoms at this time.

## 2021-03-05 NOTE — Telephone Encounter (Signed)
Okay to prescribe Diflucan 150 mg x 1.  If she does not improve/resolve after this dose, she needs to come in on the PA schedule for pelvic exam/swab.  Hollice Espy, MD

## 2021-03-05 NOTE — Telephone Encounter (Signed)
Sw pt. She verbalized understanding. Medication sent to pharmacy.

## 2021-03-11 DIAGNOSIS — M545 Low back pain, unspecified: Secondary | ICD-10-CM | POA: Diagnosis not present

## 2021-03-11 DIAGNOSIS — M1611 Unilateral primary osteoarthritis, right hip: Secondary | ICD-10-CM | POA: Diagnosis not present

## 2021-03-11 DIAGNOSIS — M1711 Unilateral primary osteoarthritis, right knee: Secondary | ICD-10-CM | POA: Diagnosis not present

## 2021-03-18 DIAGNOSIS — M47816 Spondylosis without myelopathy or radiculopathy, lumbar region: Secondary | ICD-10-CM | POA: Diagnosis not present

## 2021-03-18 DIAGNOSIS — M7061 Trochanteric bursitis, right hip: Secondary | ICD-10-CM | POA: Diagnosis not present

## 2021-03-18 DIAGNOSIS — M5416 Radiculopathy, lumbar region: Secondary | ICD-10-CM | POA: Diagnosis not present

## 2021-03-24 DIAGNOSIS — K5792 Diverticulitis of intestine, part unspecified, without perforation or abscess without bleeding: Secondary | ICD-10-CM | POA: Diagnosis not present

## 2021-03-24 DIAGNOSIS — R1032 Left lower quadrant pain: Secondary | ICD-10-CM | POA: Diagnosis not present

## 2021-03-25 DIAGNOSIS — K5730 Diverticulosis of large intestine without perforation or abscess without bleeding: Secondary | ICD-10-CM | POA: Diagnosis not present

## 2021-03-25 DIAGNOSIS — Z9049 Acquired absence of other specified parts of digestive tract: Secondary | ICD-10-CM | POA: Diagnosis not present

## 2021-03-25 DIAGNOSIS — R911 Solitary pulmonary nodule: Secondary | ICD-10-CM | POA: Diagnosis not present

## 2021-03-25 DIAGNOSIS — I700 Atherosclerosis of aorta: Secondary | ICD-10-CM | POA: Diagnosis not present

## 2021-03-25 DIAGNOSIS — Z9889 Other specified postprocedural states: Secondary | ICD-10-CM | POA: Diagnosis not present

## 2021-03-28 ENCOUNTER — Ambulatory Visit: Payer: Medicare Other | Admitting: Physician Assistant

## 2021-03-31 ENCOUNTER — Other Ambulatory Visit (HOSPITAL_COMMUNITY): Payer: Self-pay | Admitting: Family Medicine

## 2021-03-31 ENCOUNTER — Other Ambulatory Visit: Payer: Self-pay | Admitting: Family Medicine

## 2021-03-31 DIAGNOSIS — R911 Solitary pulmonary nodule: Secondary | ICD-10-CM

## 2021-04-08 DIAGNOSIS — R911 Solitary pulmonary nodule: Secondary | ICD-10-CM | POA: Diagnosis not present

## 2021-04-08 DIAGNOSIS — Z9049 Acquired absence of other specified parts of digestive tract: Secondary | ICD-10-CM | POA: Diagnosis not present

## 2021-04-08 DIAGNOSIS — J984 Other disorders of lung: Secondary | ICD-10-CM | POA: Diagnosis not present

## 2021-04-08 DIAGNOSIS — K5730 Diverticulosis of large intestine without perforation or abscess without bleeding: Secondary | ICD-10-CM | POA: Diagnosis not present

## 2021-04-10 ENCOUNTER — Encounter (HOSPITAL_COMMUNITY): Payer: Self-pay

## 2021-04-10 ENCOUNTER — Ambulatory Visit (HOSPITAL_COMMUNITY): Payer: Medicare Other

## 2021-04-18 ENCOUNTER — Encounter: Payer: Self-pay | Admitting: Internal Medicine

## 2021-04-18 ENCOUNTER — Other Ambulatory Visit: Payer: Self-pay

## 2021-04-18 ENCOUNTER — Ambulatory Visit (INDEPENDENT_AMBULATORY_CARE_PROVIDER_SITE_OTHER): Payer: Medicare Other | Admitting: Internal Medicine

## 2021-04-18 DIAGNOSIS — R918 Other nonspecific abnormal finding of lung field: Secondary | ICD-10-CM | POA: Diagnosis not present

## 2021-04-18 DIAGNOSIS — J45991 Cough variant asthma: Secondary | ICD-10-CM

## 2021-04-18 NOTE — Assessment & Plan Note (Signed)
Dx in her 47's ? Pos resp to allergy shots  Whelan eval 07/26/18   FENO 9 / skin testing positive mold spores and grass pollens rec prn saba and allegra Sinus MRI  08/19/17  taken with brain scan : Sinuses/Orbits: Negative - 09/19/2018  After extensive coaching inhaler device,  effectiveness =    50% with baseline < 25% so try diulera 100 2bid > did not take - Allergy profile 09/19/2018 >  Eos 0.2 /  IgE  60 RAST neg  - 05/02/2019 rec 1st gen H1 blockers per guidelines  And f/u ent prn   Resolved to her satisfaction as of 04/18/2021 > no f/u needed

## 2021-04-18 NOTE — Patient Instructions (Signed)
I will call with a recommendation next week after I complete your evaluation but for now I favor excisional biopsy thru VATS  vs Navigational biopsy done by bronchoscopy  and will let you know

## 2021-04-18 NOTE — Progress Notes (Signed)
Laura [name], female    DOB: [birth year]    MRN: [id number]   Brief patient profile:  81 yowf never smoker (passive smoke mother Laura [name]) last seen in pulmonary clinic by Laura [name]:  10/01/14 noncompliant with cpap and referred to pulmonary clinic 09/19/2018 by Laura [name] re: recurrent cough and sob.   Pt developed allergies/asthma in her 45's (maybe even back further but never saw specialist) had sinus surgery in 1985 and saw Laura [name] shots seemed to helped x maybe 1.5 years much less need for inhalers (some symptoms but never needed inhalers) then around 2017 worse trouble with sob and cough different from prior years but still only used intermitterently until  December 2019 and never better since with sooner after lying down nightly and forced to sleep in 60 recliner / has not been able to tol cpap as above.    History of Present Illness  09/19/2018  Pulmonary/ 1st office eval/Laura [name]  Chief Complaint  Patient presents with   Pulmonary Consult    Self referral. Pt c/o recurrent bronchitis over the past 3 years. She states at least 3 x per year she gets SOB and cough. She c/o persisent cough and SOB since 09/12/2018. Cough is prod with light yellow sputu. Cough is worse when she lies.   Dyspnea: indolent onset gradually worse to point now [facility] = can't walk 100 yards even at a slow pace at a flat grade s stopping due to sob   Cough:  Almost immediate hs / light yellow  Sleep: 60 degrees in recliner SABA use: albuterol no helping  sev rounds of prednisone smidge better transiently "but not like in past when worked well" rec Continue your protonix 40 mg Take 30-60 min before first meal of the day and pepcid 20 mg one after supper  Try zyrtec 10 mg one daily as needed for itchy/nasal congestion/throat drainage If breathing getting worse, start dulera 100 2puff every 12 hours as needed     05/02/2019  f/u ov/Laura [name] re: uacs  - doubt cough variant asthma - never took Product manager  Complaint  Patient presents with   Follow-up    Cough has improved some since the last visit.   Dyspnea:  Still MMRC2 = can't walk a nl pace on a flat grade s sob but does fine slow and flat  Cough: mostly just sensation of globus no better allegra or zyrtec Sleeping: no resp symptoms, 60 degrees  X years  SABA use: none  02: none  Rec For drainage / throat tickle try take CHLORPHENIRAMINE  4 mg   If not better to your satisfaction you need to see ENT re your throat symptoms >>> reports 100% resolved    04/18/2021   new consult re MPN  on w/u for chronic abd pain at [facility]  Patient presents with   Follow-up    Patient reports that she feels good with breathing,.    Dyspnea:  Not limited by breathing from desired activities   Cough: minimal / mucoid never bloody  Sleeping: no resp cc SABA use: none  02: none  Covid status:    vax x 3    No obvious day to day or daytime variability or assoc excess/ purulent sputum or mucus plugs or hemoptysis or cp or chest tightness, subjective wheeze or overt sinus or hb symptoms.   Sleeping  without nocturnal  or early am exacerbation  of respiratory  c/o's or  need for noct saba. Also denies any obvious fluctuation of symptoms with weather or environmental changes or other aggravating or alleviating factors except as outlined above   No unusual exposure hx or h/o childhood pna/ asthma or knowledge of premature birth.  Current Allergies, Complete Past Medical History, Past Surgical History, Family History, and Social History were reviewed in [employer] record.  ROS  The following are not active complaints unless bolded Hoarseness, sore throat, dysphagia, dental problems, itching, sneezing,  nasal congestion or discharge of excess mucus or purulent secretions, ear ache,   fever, chills, sweats, unintended wt loss or wt gain, classically pleuritic or exertional cp,  orthopnea pnd or arm/hand swelling  or leg  swelling, presyncope, palpitations, abdominal pain, anorexia, nausea, vomiting, diarrhea  or change in bowel habits or change in bladder habits, change in stools or change in urine, dysuria, hematuria,  rash, arthralgias, visual complaints, headache, numbness, weakness or ataxia or problems with walking or coordination,  change in mood or  memory.        Current Meds  Medication Sig   atorvastatin (LIPITOR) 80 MG tablet TAKE 1/2 TABLET BY MOUTH DAILY FOR CHOLESTEROL   diazepam (VALIUM) 5 MG tablet Take 1 tablet (5 mg total) by mouth every 12 (twelve) hours as needed for muscle spasms.   hydrALAZINE (APRESOLINE) 50 MG tablet Take 75 mg by mouth 3 (three) times daily.   irbesartan (AVAPRO) 300 MG tablet Take 300 mg by mouth daily.   levothyroxine (SYNTHROID, LEVOTHROID) 88 MCG tablet Take 88 mcg by mouth daily before breakfast.   metoprolol succinate (TOPROL-XL) 50 MG 24 hr tablet TAKE ONE AND ONE-HALF TABLETS BY MOUTH DAILY (Patient taking differently: Take 75 mg by mouth daily.)   NUVIGIL 250 MG tablet Take 250 mg by mouth as needed.   ondansetron (ZOFRAN) 8 MG tablet Take 8 mg by mouth every 8 (eight) hours as needed for nausea or vomiting.               Past Medical History:  Diagnosis Date   Anemia    Anxiety    Asthma    related to sesonal allergies   Chronic combined systolic and diastolic CHF, NYHA class 2 ([place]) CARDIOLOGIST-  Laura [name]   Depression    History of kidney stones    History of non-ST elevation myocardial infarction (NSTEMI) JUNE 2011   SECONDARY TO TAKOTSUDO SYNDROME (CARDIAC CATH NORMAL)   Hyperlipemia    Hypertension    Hypoglycemia    Hypothyroidism    LBBB (left bundle branch block)    Left ventricular ejection fraction less than 40%    38% PER CARDIOLOGIST NOTE (Laura [name])   Memory loss    Mood swings    Nonischemic dilated cardiomyopathy ([facility])    MODERATELY DEPRESSED LVF;EF 35-45% by Echo 05/27/11   OSA (obstructive sleep apnea) MODERATE PER  STUDY 2005   CPAP NONCOMPLIANT   Seasonal allergies    SUI (stress urinary incontinence, female)        Objective:      04/18/2021     169   05/02/2019      180  09/19/18 181 lb 9.6 oz (82.4 kg)  08/05/17 186 lb 6.4 oz (84.6 kg)  08/03/17 188 lb 11.4 oz (85.6 kg)     Vital signs reviewed  04/18/2021  - Note at rest 02 sats  95% on RA   General appearance:    pleasant amb wf nad   Obese wf  nad   HEENT : pt wearing mask not removed for exam due to covid -19 concerns.    NECK :  without JVD/Nodes/TM/ nl carotid upstrokes bilaterally   LUNGS: no acc muscle use,  Nl contour chest which is clear to A and P bilaterally without cough on insp or exp maneuvers   CV:  RRR  no s3 or murmur or increase in P2, and no edema   ABD:  obese but relatively soft and nontender with nl inspiratory excursion in the supine position. No bruits or organomegaly appreciated, bowel sounds nl  MS:  Nl gait/ ext warm without deformities, calf tenderness, cyanosis or clubbing No obvious joint restrictions   SKIN: warm and dry without lesions    NEURO:  alert, approp, nl sensorium with  no motor or cerebellar deficits apparent.            Assessment

## 2021-04-20 ENCOUNTER — Encounter: Payer: Self-pay | Admitting: Internal Medicine

## 2021-04-20 NOTE — Assessment & Plan Note (Signed)
Never smoker but pos passive exp both parents   - CT chest 05/12/20 3 mm anterior left upper lobe nodule visible on 50/7. 11 mm posterior left lower lobe nodule visible on 87/7 neither of these nodules were present on the comparison  2011 exam. - PET [facility] 04/08/21 solid left lower lobe subpleural nodule measuring 2 x 1.4 cm (image 119) with SUV max of 5.5.  solid left lower lobe subpleural nodule measuring 2 x 1.4 cm (image 119) with SUV max of 5.5.   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 but likely isolated and resectable for cure so rec refer for VATS  The other lesion does not appear to even be in the lung but is much smaller and can just be followed up in 6 m with f/u CT chest   Discussed in detail all the  indications, usual  risks and alternatives  relative to the benefits with patient who agrees to proceed with w/u as outlined and referral to T surgery          Each maintenance medication was reviewed in detail including emphasizing most importantly the difference between maintenance and prns and under what circumstances the prns are to be triggered using an action plan format where appropriate.  Total time for H and P, chart review, counseling,  and generating customized AVS unique to this office visit with new complex problem addressed  / same day charting  > 40 min

## 2021-04-24 ENCOUNTER — Telehealth: Payer: Self-pay | Admitting: Internal Medicine

## 2021-04-24 DIAGNOSIS — R911 Solitary pulmonary nodule: Secondary | ICD-10-CM

## 2021-04-24 NOTE — Telephone Encounter (Signed)
Refer to T surgery Dr Roxan Hockey or Kipp Brood next available dx LLL nodule rec vats

## 2021-04-24 NOTE — Telephone Encounter (Signed)
Instructions  I will call with a recommendation next week after I complete your evaluation but for now I favor excisional biopsy thru VATS  vs Navigational biopsy done by bronchoscopy  and will let you know       Spoke to patient's daughter, Tammy(DPR).  Tammy is calling for anupdate. Patient refers VATS vs bx.   Dr. Melvyn Novas, please advise. Thanks

## 2021-04-24 NOTE — Telephone Encounter (Signed)
Referral placed. Patient's daughter, Tammy(DPR) is aware of recommendations and voiced her understanding. Nothing further needed.

## 2021-04-28 DIAGNOSIS — E7800 Pure hypercholesterolemia, unspecified: Secondary | ICD-10-CM | POA: Diagnosis not present

## 2021-04-28 DIAGNOSIS — K5904 Chronic idiopathic constipation: Secondary | ICD-10-CM | POA: Diagnosis not present

## 2021-04-28 DIAGNOSIS — R918 Other nonspecific abnormal finding of lung field: Secondary | ICD-10-CM | POA: Diagnosis not present

## 2021-04-28 DIAGNOSIS — F411 Generalized anxiety disorder: Secondary | ICD-10-CM | POA: Diagnosis not present

## 2021-04-28 DIAGNOSIS — E039 Hypothyroidism, unspecified: Secondary | ICD-10-CM | POA: Diagnosis not present

## 2021-04-28 DIAGNOSIS — I10 Essential (primary) hypertension: Secondary | ICD-10-CM | POA: Diagnosis not present

## 2021-04-30 ENCOUNTER — Telehealth: Payer: Self-pay | Admitting: *Deleted

## 2021-04-30 DIAGNOSIS — R911 Solitary pulmonary nodule: Secondary | ICD-10-CM

## 2021-04-30 NOTE — Telephone Encounter (Signed)
-----   Message from Tanda Rockers, MD sent at 04/26/2021  4:54 AM EDT ----- Regarding: FW: consult coming... Go ahead and order PFTs  ----- Message ----- From: Tanda Rockers, MD Sent: 04/25/2021   9:50 AM EDT To: Ned Clines Subject: RE: consult coming...                          I do not have the disc, just received  the report  ----- Message ----- From: Ned Clines Sent: 04/25/2021   9:39 AM EDT To: Frederich Cha, MD, # Subject: RE: consult coming...                          The PET scan was done at [facility], Does you office have the disk with the images?  Thanks  Jenny Reichmann ----- Message ----- From: Laury Deep, RN Sent: 04/21/2021   9:24 AM EDT To: Ned Clines Subject: FW: consult coming...                          FYI, see below.  Thanks,  Thurmond Butts ----- Message ----- From: Garner Nash, DO Sent: 04/18/2021   5:27 PM EDT To: Laury Deep, RN Subject: consult coming...                               FYI keep this person on your radar. Dr. Melvyn Novas should be putting a referral in. I told him to get PFTS and send for RATs eval.   Thanks  Brad      ----- Message ----- From: Tanda Rockers, MD Sent: 04/18/2021   1:34 PM EDT To: Garner Nash, DO  This is the case - the lesion on the right is not even in the lung so I'm inclined to go ahead and do vats bx / excision on L esp since it's probably been there since 2021 - not clear clear why it wasn't pursued but we weren't involved.   [facility] think?>

## 2021-04-30 NOTE — Telephone Encounter (Signed)
Called and spoke with pt's daughter stating to her that Dr. Melvyn Novas wanted pt to have a PFT done and stated to her when checking to see when the next avail PFT was, saw that it was not until 12/5. Stated to Tammy that I have sent Dr. Melvyn Novas a message about this to see if that date would be okay or if it would be too far out and stated to Tammy once we heard from Dr. Melvyn Novas about this that we would call her back. Tammy verbalized understanding.

## 2021-04-30 NOTE — Telephone Encounter (Signed)
Dr Roxan Hockey will see her 05/08/21 and let her know if and when surgery will be done and can probably pull strings on his end if he feels surgery is more urgent and pfts really need to be done more urgently

## 2021-04-30 NOTE — Telephone Encounter (Signed)
Called and spoke with Tammy about the info stated by Dr. Melvyn Novas and she verbalized understanding. We did go ahead and scheduled pt for the PFT on 12/5 so that way pt would have an appt scheduled but stated to her if we needed to change that we could. Also stated to her that pt will need to bring her covid vaccine card if PFT is still performed at our office and she verbalized understanding. Nothing further needed.

## 2021-04-30 NOTE — Telephone Encounter (Signed)
Called pt and there was no answer-LMTCB °

## 2021-04-30 NOTE — Telephone Encounter (Signed)
Called and spoke with pt letting her know that we needed to get her scheduled for PFT and she said to call her daughter Laura [name] at [phone] to further discuss this.  Before calling Tammy, checked to see when next avail PFT would be and the next avail PFT is not until 06/02/21.  Please advise if this is too far out.

## 2021-05-08 ENCOUNTER — Encounter: Payer: Medicare Other | Admitting: Thoracic Surgery (Cardiothoracic Vascular Surgery)

## 2021-05-12 ENCOUNTER — Other Ambulatory Visit: Payer: Self-pay | Admitting: *Deleted

## 2021-05-12 ENCOUNTER — Institutional Professional Consult (permissible substitution) (INDEPENDENT_AMBULATORY_CARE_PROVIDER_SITE_OTHER): Payer: Medicare Other | Admitting: Thoracic Surgery (Cardiothoracic Vascular Surgery)

## 2021-05-12 ENCOUNTER — Other Ambulatory Visit: Payer: Self-pay

## 2021-05-12 ENCOUNTER — Encounter: Payer: Self-pay | Admitting: Thoracic Surgery (Cardiothoracic Vascular Surgery)

## 2021-05-12 VITALS — BP 166/97 | HR 69 | Resp 20 | Ht 63.0 in | Wt 167.8 lb

## 2021-05-12 DIAGNOSIS — R918 Other nonspecific abnormal finding of lung field: Secondary | ICD-10-CM | POA: Diagnosis not present

## 2021-05-12 DIAGNOSIS — R911 Solitary pulmonary nodule: Secondary | ICD-10-CM

## 2021-05-12 DIAGNOSIS — Z5181 Encounter for therapeutic drug level monitoring: Secondary | ICD-10-CM

## 2021-05-12 NOTE — Progress Notes (Signed)
PCP is Shirline Frees, MD Referring Provider is Tanda Rockers, MD  Chief Complaint  Patient presents with   Lung Lesion    New patient consultation, PET 10/11, CTA chest 11/14    HPI: [name] is sent for consultation regarding a left lower lobe lung nodule.  Laura [name] is a 75 year old non-smoker with a past medical history significant for non-ST elevation MI secondary to Takotsubo's, left bundle branch block, ischemic cardiomyopathy (EF 35 to 40%), hypertension, hyperlipidemia, hypoglycemia, hypothyroidism, obesity, asthma, anxiety, depression, and memory loss.  She had a CT about a year ago for work-up of abdominal issues.  She was noted to have a lung nodule on that.  She recently had a PET/CT which showed a 1.9 x 1.4 cm mass in the posterior left lower lobe that was hypermetabolic with an SUV of 5.5.  There was an area of some thickening along the lateral lower chest wall on the right there was also hypermetabolic, but no discrete mass was present.  Says that recently she is not having any significant issues with shortness of breath.  She can walk through the grocery store without any problems and can make it up a flight of stairs.  More limited by back pain and pain associated with walking.  Not having any chest pain, pressure, or tightness.  She says she felt like she had indigestion back when she had Takotsubo's.  She is not having that currently.  Zubrod Score: At the time of surgery this patient's most appropriate activity status/level should be described as: []     0    Normal activity, no symptoms [x]     1    Restricted in physical strenuous activity but ambulatory, able to do out light work []     2    Ambulatory and capable of self care, unable to do work activities, up and about >50 % of waking hours                              []     3    Only limited self care, in bed greater than 50% of waking hours []     4    Completely disabled, no self care, confined to bed or  chair []     5    Moribund  Past Medical History:  Diagnosis Date   Anemia    Anxiety    Asthma    related to sesonal allergies   Chronic combined systolic and diastolic CHF, NYHA class 2 ([place]) CARDIOLOGIST-  DR [id number]   Depression    History of kidney stones    History of non-ST elevation myocardial infarction (NSTEMI) JUNE 2011   SECONDARY TO TAKOTSUDO SYNDROME (CARDIAC CATH NORMAL)   Hyperlipemia    Hypertension    Hypoglycemia    Hypothyroidism    LBBB (left bundle branch block)    Left ventricular ejection fraction less than 40%    38% PER CARDIOLOGIST NOTE (DR CROITORU)   Memory loss    Mood swings    Nonischemic dilated cardiomyopathy ([place])    MODERATELY DEPRESSED LVF;EF 35-45% by Echo 05/27/11   OSA (obstructive sleep apnea) MODERATE PER STUDY 2005   CPAP NONCOMPLIANT   Seasonal allergies    SUI (stress urinary incontinence, female)     Past Surgical History:  Procedure Laterality Date   ABDOMINAL HYSTERECTOMY  1985   partial   CARDIAC CATHETERIZATION  09-04-1999;  08/25/2004;   12/09/2009  DR Sallyanne Kuster   NORMAL CORONARIES/  APICAL BALLOONING OF LV CONSISTENT WITH TAKOTSUBO SYMPTOMS/ EF 30-35%   CATARACT EXTRACTION W/ INTRAOCULAR LENS  IMPLANT, BILATERAL     CHOLECYSTECTOMY N/A 09/29/2012   Procedure: LAPAROSCOPIC CHOLECYSTECTOMY WITH INTRAOPERATIVE CHOLANGIOGRAM;  Surgeon: Adin Hector, MD;  Location: [facility];  Service: General;  Laterality: N/A;   CYSTOSCOPY N/A 09/19/2012   Procedure: Erlene Quan;  Surgeon: Bernestine Amass, MD;  Location: [facility];  Service: Urology;  Laterality: N/A;   KNEE ARTHROSCOPY W/ MENISCECTOMY  07-27-2011   MEDIAL AND LATERAL   NASAL SEPTUM SURGERY  1980's   PUBOVAGINAL SLING N/A 09/19/2012   Procedure: SUBURETHRAL Janyth Pupa;  Surgeon: Bernestine Amass, MD;  Location: [facility];  Service: Urology;  Laterality: N/A;   RIGHT URETEROSCOPIC STONE EXTRACTION  08-31-2000   TRANSTHORACIC ECHOCARDIOGRAM   05-27-2011  DR CROITORU   MODERATELY DEPRESSED LVF DUE TO GLOBAL HYPOKINESIS AND MARKED SYSTOLIC ASYNCHRONY/ EF 76%/ MILD LEFT ATRIAL DILATATION    Family History  Problem Relation Age of Onset   Pneumonia Mother    Hypertension Mother    Heart attack Father    Fibromyalgia Brother    Pulmonary embolism Brother    Hypertension Brother    Heart disease Brother    Heart attack Paternal Grandfather     Social History Social History   Tobacco Use   Smoking status: Never   Smokeless tobacco: Never  Substance Use Topics   Alcohol use: No    Alcohol/week: 0.0 standard drinks   Drug use: No    Current Outpatient Medications  Medication Sig Dispense Refill   atorvastatin (LIPITOR) 80 MG tablet TAKE 1/2 TABLET BY MOUTH DAILY FOR CHOLESTEROL 45 tablet 3   diazepam (VALIUM) 5 MG tablet Take 1 tablet (5 mg total) by mouth every 12 (twelve) hours as needed for muscle spasms. (Patient taking differently: Take 10 mg by mouth every 12 (twelve) hours as needed for muscle spasms.) 10 tablet 0   hydrALAZINE (APRESOLINE) 50 MG tablet Take 75 mg by mouth 3 (three) times daily.     irbesartan (AVAPRO) 300 MG tablet Take 300 mg by mouth daily.     levothyroxine (SYNTHROID, LEVOTHROID) 88 MCG tablet Take 88 mcg by mouth daily before breakfast.     linaclotide (LINZESS) 145 MCG CAPS capsule Take 145 mcg by mouth daily before breakfast.     Melatonin 3 MG CAPS Take 6 mg by mouth as needed. At bedtime     metoprolol succinate (TOPROL-XL) 50 MG 24 hr tablet TAKE ONE AND ONE-HALF TABLETS BY MOUTH DAILY (Patient taking differently: Take 75 mg by mouth daily.) 90 tablet 3   NUVIGIL 250 MG tablet Take 250 mg by mouth as needed.  3   ondansetron (ZOFRAN) 8 MG tablet Take 8 mg by mouth every 8 (eight) hours as needed for nausea or vomiting.     No current facility-administered medications for this visit.    Allergies  Allergen Reactions   Codeine Anaphylaxis, Hives and Other (See Comments)    Headache.  Daughter reported that it caused her throat to swell up    Ciprofloxacin Nausea And Vomiting   Ace Inhibitors Other (See Comments)    Unknown- it "didn't agree with her"    Citalopram Other (See Comments)    Fatigue   Fetzima [Levomilnacipran] Other (See Comments)    "Talking out of my head"   Lasix [Furosemide] Other (See Comments)    HEADACHE  Nsaids Other (See Comments)    Told not to take NSAIDs because of her heart    Sulfamethoxazole-Trimethoprim Other (See Comments)    NERVOUS AND DISORENTED   Xanax Xr [Alprazolam Er] Other (See Comments)    confusion    Review of Systems  Constitutional:  Positive for activity change, appetite change and unexpected weight change (Lost 4 pounds in 3 months).  Eyes:  Positive for visual disturbance (Blurry).  Respiratory:  Positive for cough and shortness of breath (Heavy exertion).   Cardiovascular:  Positive for leg swelling. Negative for chest pain.  Gastrointestinal:  Positive for abdominal pain and constipation.  Genitourinary:  Positive for frequency and urgency.  Musculoskeletal:  Positive for back pain and gait problem.  Skin:        Itching  Neurological:  Positive for numbness. Negative for seizures and syncope.  Hematological:  Bruises/bleeds easily.  Psychiatric/Behavioral:  Positive for dysphoric mood. The patient is nervous/anxious.   All other systems reviewed and are negative.  BP (!) 166/97 (BP Location: Right Arm, Patient Position: Sitting, Cuff Size: Normal)   Pulse 69   Resp 20   Ht 5\' 3"  (1.6 m)   Wt 167 lb 12.8 oz (76.1 kg)   SpO2 93% Comment: RA  BMI 29.72 kg/m  Physical Exam Vitals reviewed.  Constitutional:      General: She is not in acute distress.    Appearance: She is obese.  HENT:     Head: Normocephalic and atraumatic.  Eyes:     General: No scleral icterus.    Extraocular Movements: Extraocular movements intact.  Neck:     Vascular: No carotid bruit.  Cardiovascular:     Rate and Rhythm:  Normal rate and regular rhythm.     Heart sounds: Normal heart sounds. No murmur heard.   No friction rub. No gallop.  Pulmonary:     Effort: Pulmonary effort is normal. No respiratory distress.     Breath sounds: Normal breath sounds. No wheezing or rales.  Abdominal:     General: There is no distension.     Palpations: Abdomen is soft.  Lymphadenopathy:     Cervical: No cervical adenopathy.  Skin:    General: Skin is warm and dry.  Neurological:     General: No focal deficit present.     Mental Status: She is alert and oriented to person, place, and time.     Cranial Nerves: No cranial nerve deficit.     Motor: No weakness.  Psychiatric:     Comments: Flat affect     Diagnostic Tests: I personally reviewed the PET/CT images that came on disc from [facility].  Findings as noted in HPI.  1.4 x 1.9 cm subpleural mass left lower lobe posteriorly.  SUV of 5.5.  Also area of metabolic activity in association with an area of some soft tissue thickening but no discrete mass at right costophrenic angle.  Impression: Laura [name] is a 75 year old non-smoker with a past medical history significant for non-ST elevation MI secondary to Takotsubo's, left bundle branch block, ischemic cardiomyopathy (EF 35 to 40%), hypertension, hyperlipidemia, hypoglycemia, hypothyroidism, obesity, asthma, anxiety, depression, and memory loss.   Left lower lobe lung nodule-she has a nodule that is clearly increased in size over the past year.  It is hypermetabolic by PET.  There is no evidence of regional or distant metastatic disease.  Findings are consistent with a T1, N0, stage Ia non-small cell carcinoma.  Carcinoid tumors and infectious and inflammatory  nodules are also in the differential diagnosis.  We discussed potential options for diagnosis and treatment.  She understands the treatments for this could involve surgical resection versus radiation.  We discussed the advantages and disadvantages of each  approach.  She understands they are not equivalent, just alternatives.  I informed her of the proposed surgical procedure which would be a left VATS for wedge resection and then lobectomy if the intraoperative frozen section is positive for carcinoma.  I informed her and her family of the general nature of the procedure including the incisions to be used, the need for general anesthesia, the use of a drainage tube postoperatively, the intraoperative decision-making, the expected hospital stay, and the overall recovery.  I informed them of the indications, risk, benefits, and alternatives.  They understand the risks include, but are not limited to death, MI, DVT, PE, bleeding, possible need for transfusion, infection, prolonged air leak, cardiac arrhythmias, as well as possibility of other unforeseeable complications.  She understands accepts the risk and agrees to proceed.  She complains of feeling tired and rundown with anhedonia.  I recommended she talk with Dr. Kenton Kingfisher about her antidepressant regimen.  She had an episode of Takotsubo's with a non-ST elevation MI in 2011.  No recent cardiac symptoms.  We will check with Dr. Claiborne Billings to make sure there is no additional work-up needed from a cardiac perspective.  She needs pulmonary function testing with and without bronchodilators.  Plan: Tentatively plan for robotic left VATS for wedge resection and possible left lower lobectomy pending pulmonary function testing.  Melrose Nakayama, MD [facility] [phone]

## 2021-05-12 NOTE — H&P (View-Only) (Signed)
PCP is Shirline Frees, MD Referring Provider is Laura Rockers, MD  Chief Complaint  Patient presents with   Lung Lesion    New patient consultation, PET 10/11, CTA chest 11/14    HPI: Laura [name] is sent for consultation regarding a left lower lobe lung nodule.  Laura [name] is a 75 year old non-smoker with a past medical history significant for non-ST elevation MI secondary to Takotsubo's, left bundle branch block, ischemic cardiomyopathy (EF 35 to 40%), hypertension, hyperlipidemia, hypoglycemia, hypothyroidism, obesity, asthma, anxiety, depression, and memory loss.  She had a CT about a year ago for work-up of abdominal issues.  She was noted to have a lung nodule on that.  She recently had a PET/CT which showed a 1.9 x 1.4 cm mass in the posterior left lower lobe that was hypermetabolic with an SUV of 5.5.  There was an area of some thickening along the lateral lower chest wall on the right there was also hypermetabolic, but no discrete mass was present.  Says that recently she is not having any significant issues with shortness of breath.  She can walk through the grocery store without any problems and can make it up a flight of stairs.  More limited by back pain and pain associated with walking.  Not having any chest pain, pressure, or tightness.  She says she felt like she had indigestion back when she had Takotsubo's.  She is not having that currently.  Zubrod Score: At the time of surgery this patient's most appropriate activity status/level should be described as: []     0    Normal activity, no symptoms [x]     1    Restricted in physical strenuous activity but ambulatory, able to do out light work []     2    Ambulatory and capable of self care, unable to do work activities, up and about >50 % of waking hours                              []     3    Only limited self care, in bed greater than 50% of waking hours []     4    Completely disabled, no self care, confined to bed or  chair []     5    Moribund  Past Medical History:  Diagnosis Date   Anemia    Anxiety    Asthma    related to sesonal allergies   Chronic combined systolic and diastolic CHF, NYHA class 2 ([place]) CARDIOLOGIST-  DR [id number]   Depression    History of kidney stones    History of non-ST elevation myocardial infarction (NSTEMI) JUNE 2011   SECONDARY TO TAKOTSUDO SYNDROME (CARDIAC CATH NORMAL)   Hyperlipemia    Hypertension    Hypoglycemia    Hypothyroidism    LBBB (left bundle branch block)    Left ventricular ejection fraction less than 40%    38% PER CARDIOLOGIST NOTE (DR CROITORU)   Memory loss    Mood swings    Nonischemic dilated cardiomyopathy ([place])    MODERATELY DEPRESSED LVF;EF 35-45% by Echo 05/27/11   OSA (obstructive sleep apnea) MODERATE PER STUDY 2005   CPAP NONCOMPLIANT   Seasonal allergies    SUI (stress urinary incontinence, female)     Past Surgical History:  Procedure Laterality Date   ABDOMINAL HYSTERECTOMY  1985   partial   CARDIAC CATHETERIZATION  09-04-1999;  08/25/2004;   12/09/2009  DR Sallyanne Kuster   NORMAL CORONARIES/  APICAL BALLOONING OF LV CONSISTENT WITH TAKOTSUBO SYMPTOMS/ EF 30-35%   CATARACT EXTRACTION W/ INTRAOCULAR LENS  IMPLANT, BILATERAL     CHOLECYSTECTOMY N/A 09/29/2012   Procedure: LAPAROSCOPIC CHOLECYSTECTOMY WITH INTRAOPERATIVE CHOLANGIOGRAM;  Surgeon: Adin Hector, MD;  Location: [facility];  Service: General;  Laterality: N/A;   CYSTOSCOPY N/A 09/19/2012   Procedure: Erlene Quan;  Surgeon: Bernestine Amass, MD;  Location: [facility];  Service: Urology;  Laterality: N/A;   KNEE ARTHROSCOPY W/ MENISCECTOMY  07-27-2011   MEDIAL AND LATERAL   NASAL SEPTUM SURGERY  1980's   PUBOVAGINAL SLING N/A 09/19/2012   Procedure: SUBURETHRAL Janyth Pupa;  Surgeon: Bernestine Amass, MD;  Location: [facility];  Service: Urology;  Laterality: N/A;   RIGHT URETEROSCOPIC STONE EXTRACTION  08-31-2000   TRANSTHORACIC ECHOCARDIOGRAM   05-27-2011  DR CROITORU   MODERATELY DEPRESSED LVF DUE TO GLOBAL HYPOKINESIS AND MARKED SYSTOLIC ASYNCHRONY/ EF 59%/ MILD LEFT ATRIAL DILATATION    Family History  Problem Relation Age of Onset   Pneumonia Mother    Hypertension Mother    Heart attack Father    Fibromyalgia Brother    Pulmonary embolism Brother    Hypertension Brother    Heart disease Brother    Heart attack Paternal Grandfather     Social History Social History   Tobacco Use   Smoking status: Never   Smokeless tobacco: Never  Substance Use Topics   Alcohol use: No    Alcohol/week: 0.0 standard drinks   Drug use: No    Current Outpatient Medications  Medication Sig Dispense Refill   atorvastatin (LIPITOR) 80 MG tablet TAKE 1/2 TABLET BY MOUTH DAILY FOR CHOLESTEROL 45 tablet 3   diazepam (VALIUM) 5 MG tablet Take 1 tablet (5 mg total) by mouth every 12 (twelve) hours as needed for muscle spasms. (Patient taking differently: Take 10 mg by mouth every 12 (twelve) hours as needed for muscle spasms.) 10 tablet 0   hydrALAZINE (APRESOLINE) 50 MG tablet Take 75 mg by mouth 3 (three) times daily.     irbesartan (AVAPRO) 300 MG tablet Take 300 mg by mouth daily.     levothyroxine (SYNTHROID, LEVOTHROID) 88 MCG tablet Take 88 mcg by mouth daily before breakfast.     linaclotide (LINZESS) 145 MCG CAPS capsule Take 145 mcg by mouth daily before breakfast.     Melatonin 3 MG CAPS Take 6 mg by mouth as needed. At bedtime     metoprolol succinate (TOPROL-XL) 50 MG 24 hr tablet TAKE ONE AND ONE-HALF TABLETS BY MOUTH DAILY (Patient taking differently: Take 75 mg by mouth daily.) 90 tablet 3   NUVIGIL 250 MG tablet Take 250 mg by mouth as needed.  3   ondansetron (ZOFRAN) 8 MG tablet Take 8 mg by mouth every 8 (eight) hours as needed for nausea or vomiting.     No current facility-administered medications for this visit.    Allergies  Allergen Reactions   Codeine Anaphylaxis, Hives and Other (See Comments)    Headache.  Daughter reported that it caused her throat to swell up    Ciprofloxacin Nausea And Vomiting   Ace Inhibitors Other (See Comments)    Unknown- it "didn't agree with her"    Citalopram Other (See Comments)    Fatigue   Fetzima [Levomilnacipran] Other (See Comments)    "Talking out of my head"   Lasix [Furosemide] Other (See Comments)    HEADACHE  Nsaids Other (See Comments)    Told not to take NSAIDs because of her heart    Sulfamethoxazole-Trimethoprim Other (See Comments)    NERVOUS AND DISORENTED   Xanax Xr [Alprazolam Er] Other (See Comments)    confusion    Review of Systems  Constitutional:  Positive for activity change, appetite change and unexpected weight change (Lost 4 pounds in 3 months).  Eyes:  Positive for visual disturbance (Blurry).  Respiratory:  Positive for cough and shortness of breath (Heavy exertion).   Cardiovascular:  Positive for leg swelling. Negative for chest pain.  Gastrointestinal:  Positive for abdominal pain and constipation.  Genitourinary:  Positive for frequency and urgency.  Musculoskeletal:  Positive for back pain and gait problem.  Skin:        Itching  Neurological:  Positive for numbness. Negative for seizures and syncope.  Hematological:  Bruises/bleeds easily.  Psychiatric/Behavioral:  Positive for dysphoric mood. The patient is nervous/anxious.   All other systems reviewed and are negative.  BP (!) 166/97 (BP Location: Right Arm, Patient Position: Sitting, Cuff Size: Normal)   Pulse 69   Resp 20   Ht 5\' 3"  (1.6 m)   Wt 167 lb 12.8 oz (76.1 kg)   SpO2 93% Comment: RA  BMI 29.72 kg/m  Physical Exam Vitals reviewed.  Constitutional:      General: She is not in acute distress.    Appearance: She is obese.  HENT:     Head: Normocephalic and atraumatic.  Eyes:     General: No scleral icterus.    Extraocular Movements: Extraocular movements intact.  Neck:     Vascular: No carotid bruit.  Cardiovascular:     Rate and Rhythm:  Normal rate and regular rhythm.     Heart sounds: Normal heart sounds. No murmur heard.   No friction rub. No gallop.  Pulmonary:     Effort: Pulmonary effort is normal. No respiratory distress.     Breath sounds: Normal breath sounds. No wheezing or rales.  Abdominal:     General: There is no distension.     Palpations: Abdomen is soft.  Lymphadenopathy:     Cervical: No cervical adenopathy.  Skin:    General: Skin is warm and dry.  Neurological:     General: No focal deficit present.     Mental Status: She is alert and oriented to person, place, and time.     Cranial Nerves: No cranial nerve deficit.     Motor: No weakness.  Psychiatric:     Comments: Flat affect     Diagnostic Tests: I personally reviewed the PET/CT images that came on disc from [facility].  Findings as noted in HPI.  1.4 x 1.9 cm subpleural mass left lower lobe posteriorly.  SUV of 5.5.  Also area of metabolic activity in association with an area of some soft tissue thickening but no discrete mass at right costophrenic angle.  Impression: Laura [name] is a 75 year old non-smoker with a past medical history significant for non-ST elevation MI secondary to Takotsubo's, left bundle branch block, ischemic cardiomyopathy (EF 35 to 40%), hypertension, hyperlipidemia, hypoglycemia, hypothyroidism, obesity, asthma, anxiety, depression, and memory loss.   Left lower lobe lung nodule-she has a nodule that is clearly increased in size over the past year.  It is hypermetabolic by PET.  There is no evidence of regional or distant metastatic disease.  Findings are consistent with a T1, N0, stage Ia non-small cell carcinoma.  Carcinoid tumors and infectious and inflammatory  nodules are also in the differential diagnosis.  We discussed potential options for diagnosis and treatment.  She understands the treatments for this could involve surgical resection versus radiation.  We discussed the advantages and disadvantages of each  approach.  She understands they are not equivalent, just alternatives.  I informed her of the proposed surgical procedure which would be a left VATS for wedge resection and then lobectomy if the intraoperative frozen section is positive for carcinoma.  I informed her and her family of the general nature of the procedure including the incisions to be used, the need for general anesthesia, the use of a drainage tube postoperatively, the intraoperative decision-making, the expected hospital stay, and the overall recovery.  I informed them of the indications, risk, benefits, and alternatives.  They understand the risks include, but are not limited to death, MI, DVT, PE, bleeding, possible need for transfusion, infection, prolonged air leak, cardiac arrhythmias, as well as possibility of other unforeseeable complications.  She understands accepts the risk and agrees to proceed.  She complains of feeling tired and rundown with anhedonia.  I recommended she talk with Dr. Kenton Kingfisher about her antidepressant regimen.  She had an episode of Takotsubo's with a non-ST elevation MI in 2011.  No recent cardiac symptoms.  We will check with Dr. Claiborne Billings to make sure there is no additional work-up needed from a cardiac perspective.  She needs pulmonary function testing with and without bronchodilators.  Plan: Tentatively plan for robotic left VATS for wedge resection and possible left lower lobectomy pending pulmonary function testing.  Laura Nakayama, MD [facility] [phone]

## 2021-05-13 ENCOUNTER — Encounter: Payer: Self-pay | Admitting: *Deleted

## 2021-05-17 DIAGNOSIS — M47812 Spondylosis without myelopathy or radiculopathy, cervical region: Secondary | ICD-10-CM | POA: Diagnosis not present

## 2021-05-17 DIAGNOSIS — Z888 Allergy status to other drugs, medicaments and biological substances status: Secondary | ICD-10-CM | POA: Diagnosis not present

## 2021-05-17 DIAGNOSIS — M62838 Other muscle spasm: Secondary | ICD-10-CM | POA: Diagnosis not present

## 2021-05-17 DIAGNOSIS — Z881 Allergy status to other antibiotic agents status: Secondary | ICD-10-CM | POA: Diagnosis not present

## 2021-05-17 DIAGNOSIS — Z79890 Hormone replacement therapy (postmenopausal): Secondary | ICD-10-CM | POA: Diagnosis not present

## 2021-05-17 DIAGNOSIS — M542 Cervicalgia: Secondary | ICD-10-CM | POA: Diagnosis not present

## 2021-05-17 DIAGNOSIS — Z886 Allergy status to analgesic agent status: Secondary | ICD-10-CM | POA: Diagnosis not present

## 2021-05-17 DIAGNOSIS — Z882 Allergy status to sulfonamides status: Secondary | ICD-10-CM | POA: Diagnosis not present

## 2021-05-17 DIAGNOSIS — Z885 Allergy status to narcotic agent status: Secondary | ICD-10-CM | POA: Diagnosis not present

## 2021-05-17 DIAGNOSIS — Z79899 Other long term (current) drug therapy: Secondary | ICD-10-CM | POA: Diagnosis not present

## 2021-05-19 ENCOUNTER — Telehealth: Payer: Self-pay | Admitting: Cardiovascular Disease

## 2021-05-19 ENCOUNTER — Telehealth: Payer: Self-pay | Admitting: *Deleted

## 2021-05-19 NOTE — Telephone Encounter (Signed)
   Pre-operative Risk Assessment    Patient Name: SAANVIKA VAZQUES  DOB: 1945-10-19 MRN: [id number]     PT'S DAUGHTER CALLED AND SAID PT IS HAVING SURGERY 05/30/21. OUR OFFICE HAD NOT RECEIVED A CLEARANCE REQUEST. HOWEVER, I WAS ABLE TO SEE WHAT I BELIEVE IS THE SURGERY INFORMATION. I CALLED DR.HENDRICKSON'S OFFICE AND ASKED TO S/W RYAN TO CONFIRM INFORMATION I SEE IN Epic. JILL AT THEIR OFFICE DID GIVE ME THE FAX # [phone]. LEFT MESSAGE FOR RYAN TO PLEASE CALL BACK AND ASK TO S/W Kweku Stankey AND CONFIRM INFORMATION SEEN IN Epic.   Request for Surgical Clearance   Procedure:   XI ROBOTIC ASSISTED THORASCOPY-WEDGE RESECTION, possible lobectomy  Date of Surgery: Clearance 05/30/21                                 Surgeon:  DR. Modesto Charon Surgeon's Group or Practice Name:  [facility] Phone number:  [phone] Fax number:  [phone]   Type of Clearance Requested: - Medical    Type of Anesthesia:   General    Additional requests/questions:   Jiles Prows   05/19/2021, 8:54 AM

## 2021-05-19 NOTE — Telephone Encounter (Signed)
   Name: Laura [name]  DOB: 05-Nov-1945  MRN: [id number]   Primary Cardiologist: Shelva Majestic, MD  Chart reviewed as part of pre-operative protocol coverage. Patient was contacted 05/19/2021 in reference to pre-operative risk assessment for pending surgery as outlined below.  Laura [name] was last seen on 12/31/20 by Dr. Claiborne Billings.  Since that day, Laura [name] has done well. She does not do any exercise due to chronic back and knee pain but able to do house hold chores and ADLs without any issue. She is able to get at least 4 METs of activity. She denies any chest pain, SOB, palpitations, orthopnea, PND, LE edema or syncope.   Therefore, based on ACC/AHA guidelines, the patient would be at acceptable risk for the planned procedure without further cardiovascular testing.   The patient was advised that if she develops new symptoms prior to surgery to contact our office to arrange for a follow-up visit, and she verbalized understanding.  I will route this recommendation to the requesting party via Epic fax function and remove from pre-op pool. Please call with questions.  [place], [place] 05/19/2021, 9:18 AM

## 2021-05-19 NOTE — Telephone Encounter (Signed)
Returned call to daughter (ok per DPR)-she was wondering if Dr. Claiborne Billings was aware of patients upcoming procedure and has given cardiac clearance for this.    Advised per chart review-clearance was provided and sent to Dr. Roxan Hockey this morning.   She would like to make sure Dr. Claiborne Billings agrees with this and believes she is okay from a cardiac standpoint to proceed.    Advised would route to Dr. Claiborne Billings to review.

## 2021-05-19 NOTE — Telephone Encounter (Signed)
Patient's daughter would like to speak to nurse, before her mother has some nodules removed next week Friday. She just has a couple of questions.

## 2021-05-26 NOTE — Telephone Encounter (Signed)
Patient's daughter is calling to check on status.

## 2021-05-26 NOTE — Telephone Encounter (Signed)
Returned call to patient's daughter (okay per DPR), patients daughter states that she was calling in regard to patients surgical clearance, patients daughter states that she is aware that patient is cleared for surgery from our office but states she just wants to make sure that Dr. Claiborne Billings is aware and that he agrees. Advised I would forward message to Dr. Claiborne Billings for him to review and advise. Patients daughter verbalized understanding.

## 2021-05-27 NOTE — Progress Notes (Signed)
Surgical Instructions    Your procedure is scheduled on Friday, December 2nd, 2022.   Report to [facility] Main Entrance "A" at 05:30 A.M., then check in with the Admitting office.  Call this number if you have problems the morning of surgery:  [phone]   If you have any questions prior to your surgery date call [phone]: Open Monday-Friday 8am-4pm    Remember:  Do not eat or drink after midnight the night before your surgery    Take these medicines the morning of surgery with A SIP OF WATER:  atorvastatin (LIPITOR) hydrALAZINE (APRESOLINE)  levothyroxine (SYNTHROID, LEVOTHROID) metoprolol succinate (TOPROL-XL)  If needed:  diazepam (VALIUM) ondansetron (ZOFRAN)   As of today, STOP taking any Aspirin (unless otherwise instructed by your surgeon) Aleve, Naproxen, Ibuprofen, Motrin, Advil, Goody's, BC's, all herbal medications, fish oil, and all vitamins.   After your COVID test   You are not required to quarantine however you are required to wear a well-fitting mask when you are out and around people not in your household.  If your mask becomes wet or soiled, replace with a new one.  Wash your hands often with soap and water for 20 seconds or clean your hands with an alcohol-based hand sanitizer that contains at least 60% alcohol.  Do not share personal items.  Notify your provider: if you are in close contact with someone who has COVID  or if you develop a fever of 100.4 or greater, sneezing, cough, sore throat, shortness of breath or body aches.    The day of surgery:          Do not wear jewelry or makeup Do not wear lotions, powders, perfumes, or deodorant. Do not shave 48 hours prior to surgery.   Do not bring valuables to the hospital. DO Not wear nail polish, gel polish, artificial nails, or any other type of covering on natural nails including finger and toenails. If patients have artificial nails, gel coating, etc. that need to be removed by a nail  salon, please have this removed prior to surgery or surgery may need to be canceled/delayed if the surgeon/ anesthesia feels like the patient is unable to be adequately monitored.              [facility] is not responsible for any belongings or valuables.  Do NOT Smoke (Tobacco/Vaping)  24 hours prior to your procedure  If you use a CPAP at night, you may bring your mask for your overnight stay.   Contacts, glasses, hearing aids, dentures or partials may not be worn into surgery, please bring cases for these belongings   For patients admitted to the hospital, discharge time will be determined by your treatment team.   Patients discharged the day of surgery will not be allowed to drive home, and someone needs to stay with them for 24 hours.  NO VISITORS WILL BE ALLOWED IN PRE-OP WHERE PATIENTS ARE PREPPED FOR SURGERY.  ONLY 1 SUPPORT PERSON MAY BE PRESENT IN THE WAITING ROOM WHILE YOU ARE IN SURGERY.  IF YOU ARE TO BE ADMITTED, ONCE YOU ARE IN YOUR ROOM YOU WILL BE ALLOWED TWO (2) VISITORS. 1 (ONE) VISITOR MAY STAY OVERNIGHT BUT MUST ARRIVE TO THE ROOM BY 8pm.  Minor children may have two parents present. Special consideration for safety and communication needs will be reviewed on a case by case basis.  Special instructions:    Oral Hygiene is also important to reduce your risk of infection.  Remember -  BRUSH YOUR TEETH THE MORNING OF SURGERY WITH YOUR REGULAR TOOTHPASTE   [facility]- Preparing For Surgery  Before surgery, you can play an important role. Because skin is not sterile, your skin needs to be as free of germs as possible. You can reduce the number of germs on your skin by washing with CHG (chlorahexidine gluconate) Soap before surgery.  CHG is an antiseptic cleaner which kills germs and bonds with the skin to continue killing germs even after washing.     Please do not use if you have an allergy to CHG or antibacterial soaps. If your skin becomes reddened/irritated stop using  the CHG.  Do not shave (including legs and underarms) for at least 48 hours prior to first CHG shower. It is OK to shave your face.  Please follow these instructions carefully.     Shower the NIGHT BEFORE SURGERY and the MORNING OF SURGERY with CHG Soap.   If you chose to wash your hair, wash your hair first as usual with your normal shampoo. After you shampoo, rinse your hair and body thoroughly to remove the shampoo.  Then [employer] and genitals (private parts) with your normal soap and rinse thoroughly to remove soap.  After that Use CHG Soap as you would any other liquid soap. You can apply CHG directly to the skin and wash gently with a scrungie or a clean washcloth.   Apply the CHG Soap to your body ONLY FROM THE NECK DOWN.  Do not use on open wounds or open sores. Avoid contact with your eyes, ears, mouth and genitals (private parts). Wash Face and genitals (private parts)  with your normal soap.   Wash thoroughly, paying special attention to the area where your surgery will be performed.  Thoroughly rinse your body with warm water from the neck down.  DO NOT shower/wash with your normal soap after using and rinsing off the CHG Soap.  Pat yourself dry with a CLEAN TOWEL.  Wear CLEAN PAJAMAS to bed the night before surgery  Place CLEAN SHEETS on your bed the night before your surgery  DO NOT SLEEP WITH PETS.   Day of Surgery:  Take a shower with CHG soap. Wear Clean/Comfortable clothing the morning of surgery Do not apply any deodorants/lotions.   Remember to brush your teeth WITH YOUR REGULAR TOOTHPASTE.   Please read over the following fact sheets that you were given.

## 2021-05-28 ENCOUNTER — Encounter (HOSPITAL_COMMUNITY): Payer: Self-pay

## 2021-05-28 ENCOUNTER — Other Ambulatory Visit: Payer: Self-pay

## 2021-05-28 ENCOUNTER — Ambulatory Visit (HOSPITAL_COMMUNITY)
Admission: RE | Admit: 2021-05-28 | Discharge: 2021-05-28 | Disposition: A | Discharge status: Home / Self Care | Payer: Medicare Other | Source: Ambulatory Visit | Attending: Thoracic Surgery (Cardiothoracic Vascular Surgery) | Admitting: Thoracic Surgery (Cardiothoracic Vascular Surgery)

## 2021-05-28 ENCOUNTER — Encounter (HOSPITAL_COMMUNITY)
Admission: RE | Admit: 2021-05-28 | Discharge: 2021-05-28 | Disposition: A | Discharge status: Home / Self Care | Payer: Medicare Other | Source: Ambulatory Visit | Attending: Thoracic Surgery (Cardiothoracic Vascular Surgery) | Admitting: Thoracic Surgery (Cardiothoracic Vascular Surgery)

## 2021-05-28 DIAGNOSIS — Z90710 Acquired absence of both cervix and uterus: Secondary | ICD-10-CM | POA: Insufficient documentation

## 2021-05-28 DIAGNOSIS — Z4682 Encounter for fitting and adjustment of non-vascular catheter: Secondary | ICD-10-CM | POA: Diagnosis not present

## 2021-05-28 DIAGNOSIS — Z9049 Acquired absence of other specified parts of digestive tract: Secondary | ICD-10-CM | POA: Insufficient documentation

## 2021-05-28 DIAGNOSIS — I447 Left bundle-branch block, unspecified: Secondary | ICD-10-CM | POA: Diagnosis not present

## 2021-05-28 DIAGNOSIS — K5730 Diverticulosis of large intestine without perforation or abscess without bleeding: Secondary | ICD-10-CM | POA: Insufficient documentation

## 2021-05-28 DIAGNOSIS — I5042 Chronic combined systolic (congestive) and diastolic (congestive) heart failure: Secondary | ICD-10-CM | POA: Diagnosis not present

## 2021-05-28 DIAGNOSIS — I110 Hypertensive heart disease with heart failure: Secondary | ICD-10-CM | POA: Diagnosis not present

## 2021-05-28 DIAGNOSIS — Z5181 Encounter for therapeutic drug level monitoring: Secondary | ICD-10-CM

## 2021-05-28 DIAGNOSIS — Z6829 Body mass index (BMI) 29.0-29.9, adult: Secondary | ICD-10-CM | POA: Diagnosis not present

## 2021-05-28 DIAGNOSIS — J45909 Unspecified asthma, uncomplicated: Secondary | ICD-10-CM | POA: Diagnosis not present

## 2021-05-28 DIAGNOSIS — E669 Obesity, unspecified: Secondary | ICD-10-CM | POA: Diagnosis not present

## 2021-05-28 DIAGNOSIS — E559 Vitamin D deficiency, unspecified: Secondary | ICD-10-CM | POA: Diagnosis not present

## 2021-05-28 DIAGNOSIS — Z79899 Other long term (current) drug therapy: Secondary | ICD-10-CM | POA: Diagnosis not present

## 2021-05-28 DIAGNOSIS — R7303 Prediabetes: Secondary | ICD-10-CM | POA: Insufficient documentation

## 2021-05-28 DIAGNOSIS — E039 Hypothyroidism, unspecified: Secondary | ICD-10-CM | POA: Diagnosis not present

## 2021-05-28 DIAGNOSIS — R911 Solitary pulmonary nodule: Secondary | ICD-10-CM

## 2021-05-28 DIAGNOSIS — Z91199 Patient's noncompliance with other medical treatment and regimen due to unspecified reason: Secondary | ICD-10-CM | POA: Diagnosis not present

## 2021-05-28 DIAGNOSIS — R000 Tachycardia, unspecified: Secondary | ICD-10-CM | POA: Diagnosis not present

## 2021-05-28 DIAGNOSIS — C3490 Malignant neoplasm of unspecified part of unspecified bronchus or lung: Secondary | ICD-10-CM | POA: Insufficient documentation

## 2021-05-28 DIAGNOSIS — I252 Old myocardial infarction: Secondary | ICD-10-CM | POA: Diagnosis not present

## 2021-05-28 DIAGNOSIS — G8929 Other chronic pain: Secondary | ICD-10-CM | POA: Diagnosis not present

## 2021-05-28 DIAGNOSIS — Z79890 Hormone replacement therapy (postmenopausal): Secondary | ICD-10-CM | POA: Diagnosis not present

## 2021-05-28 DIAGNOSIS — E785 Hyperlipidemia, unspecified: Secondary | ICD-10-CM | POA: Diagnosis not present

## 2021-05-28 DIAGNOSIS — G4733 Obstructive sleep apnea (adult) (pediatric): Secondary | ICD-10-CM | POA: Insufficient documentation

## 2021-05-28 DIAGNOSIS — C3432 Malignant neoplasm of lower lobe, left bronchus or lung: Secondary | ICD-10-CM | POA: Diagnosis not present

## 2021-05-28 DIAGNOSIS — Z8249 Family history of ischemic heart disease and other diseases of the circulatory system: Secondary | ICD-10-CM | POA: Diagnosis not present

## 2021-05-28 DIAGNOSIS — I420 Dilated cardiomyopathy: Secondary | ICD-10-CM | POA: Diagnosis not present

## 2021-05-28 DIAGNOSIS — I082 Rheumatic disorders of both aortic and tricuspid valves: Secondary | ICD-10-CM | POA: Insufficient documentation

## 2021-05-28 DIAGNOSIS — I10 Essential (primary) hypertension: Secondary | ICD-10-CM | POA: Insufficient documentation

## 2021-05-28 DIAGNOSIS — I454 Nonspecific intraventricular block: Secondary | ICD-10-CM | POA: Diagnosis present

## 2021-05-28 DIAGNOSIS — F32A Depression, unspecified: Secondary | ICD-10-CM | POA: Diagnosis not present

## 2021-05-28 DIAGNOSIS — Z01818 Encounter for other preprocedural examination: Secondary | ICD-10-CM

## 2021-05-28 DIAGNOSIS — K5909 Other constipation: Secondary | ICD-10-CM | POA: Diagnosis present

## 2021-05-28 DIAGNOSIS — R413 Other amnesia: Secondary | ICD-10-CM | POA: Diagnosis present

## 2021-05-28 DIAGNOSIS — J9811 Atelectasis: Secondary | ICD-10-CM | POA: Diagnosis not present

## 2021-05-28 DIAGNOSIS — Z902 Acquired absence of lung [part of]: Secondary | ICD-10-CM | POA: Diagnosis not present

## 2021-05-28 DIAGNOSIS — I255 Ischemic cardiomyopathy: Secondary | ICD-10-CM | POA: Diagnosis present

## 2021-05-28 DIAGNOSIS — F419 Anxiety disorder, unspecified: Secondary | ICD-10-CM | POA: Diagnosis present

## 2021-05-28 DIAGNOSIS — I493 Ventricular premature depolarization: Secondary | ICD-10-CM | POA: Insufficient documentation

## 2021-05-28 DIAGNOSIS — J939 Pneumothorax, unspecified: Secondary | ICD-10-CM | POA: Diagnosis not present

## 2021-05-28 DIAGNOSIS — J439 Emphysema, unspecified: Secondary | ICD-10-CM | POA: Diagnosis not present

## 2021-05-28 DIAGNOSIS — Z20822 Contact with and (suspected) exposure to covid-19: Secondary | ICD-10-CM | POA: Diagnosis not present

## 2021-05-28 HISTORY — DX: Atherosclerotic heart disease of native coronary artery without angina pectoris: I25.10

## 2021-05-28 HISTORY — DX: Prediabetes: R73.03

## 2021-05-28 LAB — COMPREHENSIVE METABOLIC PANEL
ALT: 17 U/L (ref 0–44)
AST: 21 U/L (ref 15–41)
Albumin: 3.7 g/dL (ref 3.5–5.0)
Alkaline Phosphatase: 61 U/L (ref 38–126)
Anion gap: 9 (ref 5–15)
BUN: 11 mg/dL (ref 8–23)
CO2: 23 mmol/L (ref 22–32)
Calcium: 9.3 mg/dL (ref 8.9–10.3)
Chloride: 107 mmol/L (ref 98–111)
Creatinine, Ser: 0.94 mg/dL (ref 0.44–1.00)
GFR, Estimated: >60 mL/min (ref >60)
Glucose, Bld: 97 mg/dL (ref 70–99)
Potassium: 4 mmol/L (ref 3.5–5.1)
Sodium: 139 mmol/L (ref 135–145)
Total Bilirubin: 0.3 mg/dL (ref 0.3–1.2)
Total Protein: 6.6 g/dL (ref 6.5–8.1)

## 2021-05-28 LAB — SARS CORONAVIRUS 2 (TAT 6-24 HRS): SARS Coronavirus 2: NEGATIVE

## 2021-05-28 LAB — GLUCOSE, CAPILLARY: Glucose-Capillary: 101 mg/dL — ABNORMAL HIGH (ref 70–99)

## 2021-05-28 LAB — BLOOD GAS, ARTERIAL
Acid-Base Excess: 1.2 mmol/L (ref 0.0–2.0)
Bicarbonate: 25.4 mmol/L (ref 20.0–28.0)
Drawn by: 58793
FIO2: 21
O2 Saturation: 95.8 %
Patient temperature: 37
pCO2 arterial: 41.1 mmHg (ref 32.0–48.0)
pH, Arterial: 7.408 (ref 7.350–7.450)
pO2, Arterial: 78.5 mmHg — ABNORMAL LOW (ref 83.0–108.0)

## 2021-05-28 LAB — TYPE AND SCREEN
ABO/RH(D): B POS
Antibody Screen: NEGATIVE

## 2021-05-28 LAB — URINALYSIS, ROUTINE W REFLEX MICROSCOPIC
Bilirubin Urine: NEGATIVE
Glucose, UA: NEGATIVE mg/dL
Hgb urine dipstick: NEGATIVE
Ketones, ur: NEGATIVE mg/dL
Leukocytes,Ua: NEGATIVE
Nitrite: NEGATIVE
Protein, ur: NEGATIVE mg/dL
Specific Gravity, Urine: 1.02 (ref 1.005–1.030)
pH: 6 (ref 5.0–8.0)

## 2021-05-28 LAB — SURGICAL PCR SCREEN
MRSA, PCR: NEGATIVE
Staphylococcus aureus: NEGATIVE

## 2021-05-28 LAB — PROTIME-INR
INR: 1 (ref 0.8–1.2)
Prothrombin Time: 13 seconds (ref 11.4–15.2)

## 2021-05-28 LAB — CBC
HCT: 40.3 % (ref 36.0–46.0)
Hemoglobin: 12.5 g/dL (ref 12.0–15.0)
MCH: 25.2 pg — ABNORMAL LOW (ref 26.0–34.0)
MCHC: 31 g/dL (ref 30.0–36.0)
MCV: 81.3 fL (ref 80.0–100.0)
Platelets: 259 10*3/uL (ref 150–400)
RBC: 4.96 MIL/uL (ref 3.87–5.11)
RDW: 14.4 % (ref 11.5–15.5)
WBC: 7.9 10*3/uL (ref 4.0–10.5)
nRBC: 0 % (ref 0.0–0.2)

## 2021-05-28 LAB — APTT: aPTT: 29 seconds (ref 24–36)

## 2021-05-28 NOTE — Progress Notes (Signed)
PCP - Dr. Shirline Frees Cardiologist - Dr. Shelva Majestic with [facility] clearance in chart  Chest x-ray - 05/28/21 EKG - 05/28/21 Stress Test - Denies ECHO - 06/24/20 Cardiac Cath - 12/09/09 (CE)  Sleep Study - Yes has OSA CPAP - Not using claustrophobic   Pre diabetic CBG at PAT appt 104  COVID TEST- 05/28/21   Anesthesia review: Yes cardiac history  Patient denies shortness of breath, fever, cough and chest pain at PAT appointment   All instructions explained to the patient, with a verbal understanding of the material. Patient agrees to go over the instructions while at home for a better understanding. Patient also instructed to wear a mask while in public after being tested for COVID-19. The opportunity to ask questions was provided.

## 2021-05-28 NOTE — Telephone Encounter (Signed)
Patient's daughter called again to check on status.

## 2021-05-28 NOTE — Telephone Encounter (Signed)
Called daughter back. Advised that the Preop team did review and if there was any concern about her having the surgery they would have reached out to Dr.Kelly. Daughter verbalized understanding, thankful for call back.

## 2021-05-29 ENCOUNTER — Ambulatory Visit (HOSPITAL_COMMUNITY)
Admission: RE | Admit: 2021-05-29 | Discharge: 2021-05-29 | Disposition: A | Discharge status: Home / Self Care | Payer: Medicare Other | Source: Ambulatory Visit | Attending: Thoracic Surgery (Cardiothoracic Vascular Surgery) | Admitting: Thoracic Surgery (Cardiothoracic Vascular Surgery)

## 2021-05-29 DIAGNOSIS — R911 Solitary pulmonary nodule: Secondary | ICD-10-CM

## 2021-05-29 LAB — PULMONARY FUNCTION TEST
DL/VA % pred: 102 %
DL/VA: 4.22 ml/min/mmHg/L
DLCO cor % pred: 89 %
DLCO cor: 16.53 ml/min/mmHg
DLCO unc % pred: 86 %
DLCO unc: 16.05 ml/min/mmHg
FEF 25-75 Post: 2.19 L/sec
FEF 25-75 Pre: 2.16 L/sec
FEF2575-%Change-Post: 1 %
FEF2575-%Pred-Post: 136 %
FEF2575-%Pred-Pre: 134 %
FEV1-%Change-Post: -1 %
FEV1-%Pred-Post: 96 %
FEV1-%Pred-Pre: 97 %
FEV1-Post: 1.94 L
FEV1-Pre: 1.96 L
FEV1FVC-%Change-Post: 4 %
FEV1FVC-%Pred-Pre: 109 %
FEV6-%Change-Post: -4 %
FEV6-%Pred-Post: 88 %
FEV6-%Pred-Pre: 92 %
FEV6-Post: 2.26 L
FEV6-Pre: 2.37 L
FEV6FVC-%Change-Post: 0 %
FEV6FVC-%Pred-Post: 105 %
FEV6FVC-%Pred-Pre: 104 %
FVC-%Change-Post: -5 %
FVC-%Pred-Post: 83 %
FVC-%Pred-Pre: 88 %
FVC-Post: 2.26 L
FVC-Pre: 2.39 L
Post FEV1/FVC ratio: 86 %
Post FEV6/FVC ratio: 100 %
Pre FEV1/FVC ratio: 82 %
Pre FEV6/FVC Ratio: 99 %
RV % pred: 105 %
RV: 2.36 L
TLC % pred: 95 %
TLC: 4.68 L

## 2021-05-29 MED ORDER — ALBUTEROL SULFATE (2.5 MG/3ML) 0.083% IN NEBU
2.5000 mg | INHALATION_SOLUTION | Freq: Once | RESPIRATORY_TRACT | Status: AC
  Administered 2021-05-29: 2.5 mg via RESPIRATORY_TRACT

## 2021-05-29 NOTE — Progress Notes (Addendum)
Anesthesia Chart Review:  History of HTN, left bundle branch block, Takotsubo cardiomyopathy 2011 (presented with NSTEMI-cath showed normal coronaries), frequent PVCs.  Lexiscan 2018 was nonischemic.  Echo December 2021 showed EF 40-45% and findings consistent with left bundle branch block with septal dyssynergy, mildly reduced right ventricular systolic function, and grade 1 diastolic dysfunction. Cardiac clearance per telephone encounter 05/19/21, "Chart reviewed as part of pre-operative protocol coverage. Patient was contacted 05/19/2021 in reference to pre-operative risk assessment for pending surgery as outlined below.  ELIZAVETA MATTICE was last seen on 12/31/20 by Dr. Claiborne Billings.  Since that day, SONJIA WILCOXSON has done well. She does not do any exercise due to chronic back and knee pain but able to do house hold chores and ADLs without any issue. She is able to get at least 4 METs of activity. She denies any chest pain, SOB, palpitations, orthopnea, PND, LE edema or syncope. Therefore, based on ACC/AHA guidelines, the patient would be at acceptable risk for the planned procedure without further cardiovascular testing."   Pt had a CT about a year ago for work-up of abdominal issues.  She was noted to have a lung nodule on that.  She recently had a PET/CT which showed a 1.9 x 1.4 cm mass in the posterior left lower lobe that was hypermetabolic with an SUV of 5.5.  There was an area of some thickening along the lateral lower chest wall on the right there was also hypermetabolic, but no discrete mass was present.  Moderate OSA, noncompliant with CPAP.  Preop labs reviewed, unremarkable.  EKG 05/29/2021: Sinus rhythm with marked sinus arrhythmia with occasional Premature ventricular complexes. Left bundle branch block. No significant change since last tracing  PET CT 04/08/2021 (Care Everywhere): IMPRESSION:  1.  Left lower lobe pulmonary nodule has increased FDG activity and is worrisome for primary lung  cancer.  2.  Focal hypermetabolic thickening of the right inferior pleura/diaphragm is of uncertain etiology. Given the focality, neoplasm is a possibility.     CT abdomen pelvis (Care Everywhere): IMPRESSION:  1. Descending and sigmoid diverticulosis without evidence of acute  diverticulitis.  2. There has been significant interval enlargement of a spiculated  nodule of the dependent left lower lobe, now measuring 1.6 x 1.1 cm,  previously 1.1 x 0.7 cm. Findings are highly concerning for primary  lung malignancy. Consider multidisciplinary thoracic referral for  consideration of metabolic characterization via PET-CT and tissue  sampling.  3. Status post cholecystectomy and hysterectomy.   TTE 06/24/20:  1. Left ventricular ejection fraction, by estimation, is 40 to 45%. The  left ventricle has mildly decreased function. The left ventricle  demonstrates regional wall motion abnormalities. Abnormal (paradoxical)  septal motion, consistent with left bundle  branch block. The left ventricular internal cavity size was mildly  dilated. There is mild left ventricular hypertrophy. Left ventricular  diastolic parameters are consistent with Grade I diastolic dysfunction  (impaired relaxation).   2. Right ventricular systolic function is mildly reduced. The right  ventricular size is normal. Tricuspid regurgitation signal is inadequate  for assessing PA pressure.   3. The mitral valve is normal in structure. Trivial mitral valve  regurgitation.   4. The aortic valve was not well visualized. Aortic valve regurgitation  is trivial. No aortic stenosis is present.   5. The inferior vena cava is normal in size with greater than 50%  respiratory variability, suggesting right atrial pressure of 3 mmHg.   Comparison(s): 12/09/18 EF 45%.  Wynonia Musty [facility] Short Stay Center/Anesthesiology Phone [phone] 05/29/2021 9:52 AM

## 2021-05-29 NOTE — Anesthesia Preprocedure Evaluation (Addendum)
Anesthesia Evaluation  Patient identified by MRN, date of birth, ID band Patient awake    Reviewed: Allergy & Precautions, NPO status , Patient's Chart, lab work & pertinent test results, reviewed documented beta blocker date and time   History of Anesthesia Complications Negative for: history of anesthetic complications  Airway Mallampati: II  TM Distance: >3 FB Neck ROM: Full    Dental  (+) Teeth Intact, Dental Advisory Given   Pulmonary asthma , sleep apnea ,    breath sounds clear to auscultation       Cardiovascular hypertension, Pt. on medications and Pt. on home beta blockers (-) angina+ Past MI, +CHF and + DOE  + dysrhythmias  Rhythm:Irregular  1. Left ventricular ejection fraction, by estimation, is 40 to 45%. The  left ventricle has mildly decreased function. The left ventricle  demonstrates regional wall motion abnormalities. Abnormal (paradoxical)  septal motion, consistent with left bundle  branch block. The left ventricular internal cavity size was mildly  dilated. There is mild left ventricular hypertrophy. Left ventricular  diastolic parameters are consistent with Grade I diastolic dysfunction  (impaired relaxation).  2. Right ventricular systolic function is mildly reduced. The right  ventricular size is normal. Tricuspid regurgitation signal is inadequate  for assessing PA pressure.  3. The mitral valve is normal in structure. Trivial mitral valve  regurgitation.  4. The aortic valve was not well visualized. Aortic valve regurgitation  is trivial. No aortic stenosis is present.  5. The inferior vena cava is normal in size with greater than 50%  respiratory variability, suggesting right atrial pressure of 3 mmHg.   History of non-ST elevation myocardial infarction (NSTEMI) 11/27/2009 SECONDARY TO TAKOTSUDO SYNDROME (CARDIAC CATH NORMAL)    Neuro/Psych PSYCHIATRIC DISORDERS Anxiety Depression negative  neurological ROS     GI/Hepatic GERD  Controlled,(+) Hepatitis -  Endo/Other  Hypothyroidism   Renal/GU      Musculoskeletal negative musculoskeletal ROS (+)   Abdominal   Peds  Hematology negative hematology ROS (+) Lab Results      Component                Value               Date                      WBC                      7.9                 05/28/2021                HGB                      12.5                05/28/2021                HCT                      40.3                05/28/2021                MCV                      81.3  05/28/2021                PLT                      259                 05/28/2021              Anesthesia Other Findings History of HTN, left bundle branch block, Takotsubo cardiomyopathy 2011 (presented with NSTEMI-cath showed normal coronaries), frequent PVCs.  Lexiscan 2018 was nonischemic.  Echo December 2021 showed EF 40-45% and findings consistent with left bundle branch block with septal dyssynergy, mildly reduced right ventricular systolic function, and grade 1 diastolic dysfunction. Cardiac clearance per telephone encounter 05/19/21, "Chart reviewed as part of pre-operative protocol coverage. Patient was contacted11/21/2022in reference to pre-operative risk assessment for pending surgery as outlined below. Laura Kewley McBridewas last seen on 7/5/22by Dr. Claiborne Billings. Since that day, Laura Burnett McBridehas done well. She does not do any exercise due to chronic back and knee pain but able to do house hold chores and ADLs without any issue. She is able to get at least 4 METs of activity. She denies any chest pain, SOB, palpitations, orthopnea, PND, LE edema or syncope.Therefore, based on ACC/AHA guidelines, the patient would be at acceptable risk for the planned procedure without further cardiovascular testing."   Pt had a CT about a year ago for work-up of abdominal issues.  She was noted to have a lung nodule on that.  She recently  had a PET/CT which showed a 1.9 x 1.4 cm mass in the posterior left lower lobe that was hypermetabolic with an SUV of 5.5.  There was an area of some thickening along the lateral lower chest wall on the right there was also hypermetabolic, but no discrete mass was present.  Moderate OSA, noncompliant with CPAP.  Reproductive/Obstetrics                            Anesthesia Physical Anesthesia Plan  ASA: 3  Anesthesia Plan: General   Post-op Pain Management: Toradol IV (intra-op)   Induction: Intravenous  PONV Risk Score and Plan: 3 and Dexamethasone and Ondansetron  Airway Management Planned: Double Lumen EBT  Additional Equipment: Arterial line  Intra-op Plan:   Post-operative Plan: Extubation in OR  Informed Consent: I have reviewed the patients History and Physical, chart, labs and discussed the procedure including the risks, benefits and alternatives for the proposed anesthesia with the patient or authorized representative who has indicated his/her understanding and acceptance.     Dental advisory given  Plan Discussed with: CRNA and Anesthesiologist  Anesthesia Plan Comments: (PAT note by Karoline Caldwell, PA-C: History of HTN, left bundle branch block, Takotsubo cardiomyopathy 2011 (presented with NSTEMI-cath showed normal coronaries), frequent PVCs.  Lexiscan 2018 was nonischemic.  Echo December 2021 showed EF 40-45% and findings consistent with left bundle branch block with septal dyssynergy, mildly reduced right ventricular systolic function, and grade 1 diastolic dysfunction. Cardiac clearance per telephone encounter 05/19/21, "Chart reviewed as part of pre-operative protocol coverage. Patient was contacted11/21/2022in reference to pre-operative risk assessment for pending surgery as outlined below. Laura Gagliano McBridewas last seen on 7/5/22by Dr. Claiborne Billings. Since that day, Laura Ruppel McBridehas done well. She does not do any exercise due to chronic back  and knee pain but able to do house hold chores and ADLs without any issue. She is able  to get at least 4 METs of activity. She denies any chest pain, SOB, palpitations, orthopnea, PND, LE edema or syncope.Therefore, based on ACC/AHA guidelines, the patient would be at acceptable risk for the planned procedure without further cardiovascular testing."   Pt had a CT about a year ago for work-up of abdominal issues.  She was noted to have a lung nodule on that.  She recently had a PET/CT which showed a 1.9 x 1.4 cm mass in the posterior left lower lobe that was hypermetabolic with an SUV of 5.5.  There was an area of some thickening along the lateral lower chest wall on the right there was also hypermetabolic, but no discrete mass was present.  Moderate OSA, noncompliant with CPAP.  Preop labs reviewed, unremarkable.  EKG 05/29/2021: Sinus rhythm with marked sinus arrhythmia with occasional Premature ventricular complexes. Left bundle branch block. No significant change since last tracing  PET CT 04/08/2021 (Care Everywhere): IMPRESSION:  1. Left lower lobe pulmonary nodule has increased FDG activity and is worrisome for primary lung cancer.  2. Focal hypermetabolic thickening of the right inferior pleura/diaphragm is of uncertain etiology. Given the focality, neoplasm is apossibility.    CT abdomen pelvis (Care Everywhere): IMPRESSION:  1. Descending and sigmoid diverticulosis without evidence of acute  diverticulitis.  2. There has been significant interval enlargement of a spiculated  nodule of the dependent left lower lobe, now measuring 1.6 x 1.1 cm,  previously 1.1 x 0.7 cm. Findings are highly concerning for primary  lung malignancy. Consider multidisciplinary thoracic referral for  consideration of metabolic characterization via PET-CT and tissue  sampling.  3. Status post cholecystectomy and hysterectomy.   TTE 06/24/20: 1. Left ventricular ejection fraction, by estimation, is  40 to 45%. The  left ventricle has mildly decreased function. The left ventricle  demonstrates regional wall motion abnormalities. Abnormal (paradoxical)  septal motion, consistent with left bundle  branch block. The left ventricular internal cavity size was mildly  dilated. There is mild left ventricular hypertrophy. Left ventricular  diastolic parameters are consistent with Grade I diastolic dysfunction  (impaired relaxation).  2. Right ventricular systolic function is mildly reduced. The right  ventricular size is normal. Tricuspid regurgitation signal is inadequate  for assessing PA pressure.  3. The mitral valve is normal in structure. Trivial mitral valve  regurgitation.  4. The aortic valve was not well visualized. Aortic valve regurgitation  is trivial. No aortic stenosis is present.  5. The inferior vena cava is normal in size with greater than 50%  respiratory variability, suggesting right atrial pressure of 3 mmHg.   Comparison(s): 12/09/18 EF 45%.    )       Anesthesia Quick Evaluation

## 2021-05-30 ENCOUNTER — Inpatient Hospital Stay (HOSPITAL_COMMUNITY): Payer: Medicare Other

## 2021-05-30 ENCOUNTER — Inpatient Hospital Stay (HOSPITAL_COMMUNITY): Payer: Medicare Other | Admitting: Physician Assistant

## 2021-05-30 ENCOUNTER — Other Ambulatory Visit: Payer: Self-pay

## 2021-05-30 ENCOUNTER — Encounter (HOSPITAL_COMMUNITY): Payer: Self-pay | Admitting: Thoracic Surgery (Cardiothoracic Vascular Surgery)

## 2021-05-30 ENCOUNTER — Inpatient Hospital Stay (HOSPITAL_COMMUNITY)
Admission: RE | Admit: 2021-05-30 | Discharge: 2021-06-02 | DRG: 164 | Disposition: A | Discharge status: Home / Self Care | Payer: Medicare Other | Attending: Thoracic Surgery (Cardiothoracic Vascular Surgery) | Admitting: Thoracic Surgery (Cardiothoracic Vascular Surgery)

## 2021-05-30 ENCOUNTER — Encounter (HOSPITAL_COMMUNITY)
Admission: RE | Disposition: A | Discharge status: Home / Self Care | Payer: Self-pay | Source: Home / Self Care | Attending: Thoracic Surgery (Cardiothoracic Vascular Surgery)

## 2021-05-30 DIAGNOSIS — J45909 Unspecified asthma, uncomplicated: Secondary | ICD-10-CM | POA: Diagnosis present

## 2021-05-30 DIAGNOSIS — C3432 Malignant neoplasm of lower lobe, left bronchus or lung: Secondary | ICD-10-CM | POA: Diagnosis present

## 2021-05-30 DIAGNOSIS — F32A Depression, unspecified: Secondary | ICD-10-CM | POA: Diagnosis present

## 2021-05-30 DIAGNOSIS — I454 Nonspecific intraventricular block: Secondary | ICD-10-CM | POA: Diagnosis present

## 2021-05-30 DIAGNOSIS — I420 Dilated cardiomyopathy: Secondary | ICD-10-CM | POA: Diagnosis present

## 2021-05-30 DIAGNOSIS — G8929 Other chronic pain: Secondary | ICD-10-CM | POA: Diagnosis present

## 2021-05-30 DIAGNOSIS — E785 Hyperlipidemia, unspecified: Secondary | ICD-10-CM | POA: Diagnosis present

## 2021-05-30 DIAGNOSIS — I110 Hypertensive heart disease with heart failure: Secondary | ICD-10-CM | POA: Diagnosis present

## 2021-05-30 DIAGNOSIS — Z91199 Patient's noncompliance with other medical treatment and regimen due to unspecified reason: Secondary | ICD-10-CM

## 2021-05-30 DIAGNOSIS — Z885 Allergy status to narcotic agent status: Secondary | ICD-10-CM

## 2021-05-30 DIAGNOSIS — I5042 Chronic combined systolic (congestive) and diastolic (congestive) heart failure: Secondary | ICD-10-CM | POA: Diagnosis present

## 2021-05-30 DIAGNOSIS — Z8249 Family history of ischemic heart disease and other diseases of the circulatory system: Secondary | ICD-10-CM | POA: Diagnosis not present

## 2021-05-30 DIAGNOSIS — R000 Tachycardia, unspecified: Secondary | ICD-10-CM | POA: Diagnosis not present

## 2021-05-30 DIAGNOSIS — D72829 Elevated white blood cell count, unspecified: Secondary | ICD-10-CM | POA: Diagnosis not present

## 2021-05-30 DIAGNOSIS — R911 Solitary pulmonary nodule: Secondary | ICD-10-CM

## 2021-05-30 DIAGNOSIS — Z09 Encounter for follow-up examination after completed treatment for conditions other than malignant neoplasm: Secondary | ICD-10-CM

## 2021-05-30 DIAGNOSIS — Z902 Acquired absence of lung [part of]: Secondary | ICD-10-CM

## 2021-05-30 DIAGNOSIS — Z20822 Contact with and (suspected) exposure to covid-19: Secondary | ICD-10-CM | POA: Diagnosis present

## 2021-05-30 DIAGNOSIS — F419 Anxiety disorder, unspecified: Secondary | ICD-10-CM | POA: Diagnosis present

## 2021-05-30 DIAGNOSIS — J439 Emphysema, unspecified: Secondary | ICD-10-CM | POA: Diagnosis not present

## 2021-05-30 DIAGNOSIS — I255 Ischemic cardiomyopathy: Secondary | ICD-10-CM | POA: Diagnosis present

## 2021-05-30 DIAGNOSIS — Z6829 Body mass index (BMI) 29.0-29.9, adult: Secondary | ICD-10-CM | POA: Diagnosis not present

## 2021-05-30 DIAGNOSIS — Z79890 Hormone replacement therapy (postmenopausal): Secondary | ICD-10-CM

## 2021-05-30 DIAGNOSIS — I252 Old myocardial infarction: Secondary | ICD-10-CM

## 2021-05-30 DIAGNOSIS — K5909 Other constipation: Secondary | ICD-10-CM | POA: Diagnosis present

## 2021-05-30 DIAGNOSIS — G4730 Sleep apnea, unspecified: Secondary | ICD-10-CM | POA: Diagnosis present

## 2021-05-30 DIAGNOSIS — E669 Obesity, unspecified: Secondary | ICD-10-CM | POA: Diagnosis present

## 2021-05-30 DIAGNOSIS — I447 Left bundle-branch block, unspecified: Secondary | ICD-10-CM | POA: Diagnosis present

## 2021-05-30 DIAGNOSIS — E559 Vitamin D deficiency, unspecified: Secondary | ICD-10-CM | POA: Diagnosis not present

## 2021-05-30 DIAGNOSIS — J939 Pneumothorax, unspecified: Secondary | ICD-10-CM

## 2021-05-30 DIAGNOSIS — M549 Dorsalgia, unspecified: Secondary | ICD-10-CM | POA: Diagnosis present

## 2021-05-30 DIAGNOSIS — R413 Other amnesia: Secondary | ICD-10-CM | POA: Diagnosis present

## 2021-05-30 DIAGNOSIS — E039 Hypothyroidism, unspecified: Secondary | ICD-10-CM | POA: Diagnosis present

## 2021-05-30 DIAGNOSIS — Z882 Allergy status to sulfonamides status: Secondary | ICD-10-CM

## 2021-05-30 DIAGNOSIS — Z4682 Encounter for fitting and adjustment of non-vascular catheter: Secondary | ICD-10-CM | POA: Diagnosis not present

## 2021-05-30 DIAGNOSIS — J9811 Atelectasis: Secondary | ICD-10-CM | POA: Diagnosis not present

## 2021-05-30 DIAGNOSIS — Z888 Allergy status to other drugs, medicaments and biological substances status: Secondary | ICD-10-CM

## 2021-05-30 DIAGNOSIS — Z79899 Other long term (current) drug therapy: Secondary | ICD-10-CM

## 2021-05-30 HISTORY — PX: LYMPH NODE DISSECTION: SHX5087

## 2021-05-30 HISTORY — PX: INTERCOSTAL NERVE BLOCK: SHX5021

## 2021-05-30 HISTORY — PX: LOBECTOMY: SHX5089

## 2021-05-30 LAB — ABO/RH: ABO/RH(D): B POS

## 2021-05-30 LAB — GLUCOSE, CAPILLARY: Glucose-Capillary: 89 mg/dL (ref 70–99)

## 2021-05-30 SURGERY — WEDGE RESECTION, LUNG, ROBOT-ASSISTED, THORACOSCOPIC
Anesthesia: General | Site: Chest | Laterality: Left

## 2021-05-30 MED ORDER — MORPHINE SULFATE (PF) 2 MG/ML IV SOLN
2.0000 mg | INTRAVENOUS | Status: DC | PRN
  Administered 2021-05-30: 2 mg via INTRAVENOUS
  Filled 2021-05-30: qty 1

## 2021-05-30 MED ORDER — PHENYLEPHRINE 40 MCG/ML (10ML) SYRINGE FOR IV PUSH (FOR BLOOD PRESSURE SUPPORT)
PREFILLED_SYRINGE | INTRAVENOUS | Status: AC
  Filled 2021-05-30: qty 10

## 2021-05-30 MED ORDER — ALBUTEROL SULFATE HFA 108 (90 BASE) MCG/ACT IN AERS
INHALATION_SPRAY | RESPIRATORY_TRACT | Status: AC
  Filled 2021-05-30: qty 6.7

## 2021-05-30 MED ORDER — ORAL CARE MOUTH RINSE: 15.0000 mL | Freq: Once | OROMUCOSAL | Status: AC

## 2021-05-30 MED ORDER — GABAPENTIN 100 MG PO CAPS
200.0000 mg | ORAL_CAPSULE | Freq: Three times a day (TID) | ORAL | Status: DC
  Administered 2021-05-30 – 2021-06-01 (×7): 200 mg via ORAL
  Filled 2021-05-30 (×7): qty 2

## 2021-05-30 MED ORDER — 0.9 % SODIUM CHLORIDE (POUR BTL) OPTIME
TOPICAL | Status: DC | PRN
  Administered 2021-05-30: 2000 mL

## 2021-05-30 MED ORDER — ACETAMINOPHEN 500 MG PO TABS: 1000.0000 mg | ORAL_TABLET | Freq: Once | ORAL | Status: DC | PRN

## 2021-05-30 MED ORDER — LEVOTHYROXINE SODIUM 88 MCG PO TABS
88.0000 ug | ORAL_TABLET | Freq: Every day | ORAL | Status: DC
  Administered 2021-05-31 – 2021-06-02 (×3): 88 ug via ORAL
  Filled 2021-05-30 (×3): qty 1

## 2021-05-30 MED ORDER — PROPOFOL 10 MG/ML IV BOLUS
INTRAVENOUS | Status: DC | PRN
  Administered 2021-05-30: 80 mg via INTRAVENOUS
  Administered 2021-05-30: 120 mg via INTRAVENOUS

## 2021-05-30 MED ORDER — CEFAZOLIN SODIUM-DEXTROSE 2-4 GM/100ML-% IV SOLN
2.0000 g | INTRAVENOUS | Status: AC
  Administered 2021-05-30: 2 g via INTRAVENOUS
  Filled 2021-05-30: qty 100

## 2021-05-30 MED ORDER — CHLORHEXIDINE GLUCONATE CLOTH 2 % EX PADS
6.0000 | MEDICATED_PAD | Freq: Every day | CUTANEOUS | Status: DC
  Administered 2021-05-31 – 2021-06-01 (×2): 6 via TOPICAL

## 2021-05-30 MED ORDER — HYDRALAZINE HCL 25 MG PO TABS
25.0000 mg | ORAL_TABLET | Freq: Three times a day (TID) | ORAL | Status: DC
  Administered 2021-05-31 – 2021-06-02 (×4): 25 mg via ORAL
  Filled 2021-05-30 (×7): qty 1

## 2021-05-30 MED ORDER — SENNOSIDES-DOCUSATE SODIUM 8.6-50 MG PO TABS
1.0000 | ORAL_TABLET | Freq: Every day | ORAL | Status: DC
  Administered 2021-05-30 – 2021-06-01 (×3): 1 via ORAL
  Filled 2021-05-30 (×3): qty 1

## 2021-05-30 MED ORDER — PHENYLEPHRINE HCL-NACL 20-0.9 MG/250ML-% IV SOLN
INTRAVENOUS | Status: DC | PRN
  Administered 2021-05-30: 30 ug/min via INTRAVENOUS

## 2021-05-30 MED ORDER — BUPIVACAINE LIPOSOME 1.3 % IJ SUSP
INTRAMUSCULAR | Status: AC
  Filled 2021-05-30: qty 20

## 2021-05-30 MED ORDER — MELATONIN 3 MG PO TABS
6.0000 mg | ORAL_TABLET | Freq: Every day | ORAL | Status: DC
  Administered 2021-05-30 – 2021-06-01 (×3): 6 mg via ORAL
  Filled 2021-05-30 (×3): qty 2

## 2021-05-30 MED ORDER — LACTATED RINGERS IV SOLN: INTRAVENOUS | Status: DC | PRN

## 2021-05-30 MED ORDER — ROCURONIUM BROMIDE 10 MG/ML (PF) SYRINGE
PREFILLED_SYRINGE | INTRAVENOUS | Status: AC
  Filled 2021-05-30: qty 10

## 2021-05-30 MED ORDER — DEXAMETHASONE SODIUM PHOSPHATE 10 MG/ML IJ SOLN
INTRAMUSCULAR | Status: DC | PRN
  Administered 2021-05-30: 10 mg via INTRAVENOUS

## 2021-05-30 MED ORDER — ONDANSETRON HCL 4 MG/2ML IJ SOLN
4.0000 mg | Freq: Four times a day (QID) | INTRAMUSCULAR | Status: DC | PRN
  Administered 2021-06-01: 13:00:00 4 mg via INTRAVENOUS
  Filled 2021-05-30: qty 2

## 2021-05-30 MED ORDER — BUPIVACAINE HCL (PF) 0.5 % IJ SOLN
INTRAMUSCULAR | Status: AC
  Filled 2021-05-30: qty 30

## 2021-05-30 MED ORDER — ONDANSETRON HCL 4 MG/2ML IJ SOLN
INTRAMUSCULAR | Status: DC | PRN
  Administered 2021-05-30: 4 mg via INTRAVENOUS

## 2021-05-30 MED ORDER — LIDOCAINE 2% (20 MG/ML) 5 ML SYRINGE
INTRAMUSCULAR | Status: AC
  Filled 2021-05-30: qty 5

## 2021-05-30 MED ORDER — OXYCODONE HCL 5 MG/5ML PO SOLN: 5.0000 mg | Freq: Once | ORAL | Status: DC | PRN

## 2021-05-30 MED ORDER — BISACODYL 5 MG PO TBEC
10.0000 mg | DELAYED_RELEASE_TABLET | Freq: Every day | ORAL | Status: DC
  Administered 2021-05-30 – 2021-06-02 (×3): 10 mg via ORAL
  Filled 2021-05-30 (×3): qty 2

## 2021-05-30 MED ORDER — EPHEDRINE 5 MG/ML INJ
INTRAVENOUS | Status: AC
  Filled 2021-05-30: qty 5

## 2021-05-30 MED ORDER — SODIUM CHLORIDE 0.9 % IR SOLN
Status: DC | PRN
  Administered 2021-05-30: 1

## 2021-05-30 MED ORDER — TRAMADOL HCL 50 MG PO TABS
50.0000 mg | ORAL_TABLET | Freq: Four times a day (QID) | ORAL | Status: DC | PRN
  Administered 2021-05-30 (×2): 50 mg via ORAL
  Administered 2021-05-31: 100 mg via ORAL
  Filled 2021-05-30: qty 2
  Filled 2021-05-30 (×2): qty 1

## 2021-05-30 MED ORDER — FENTANYL CITRATE (PF) 100 MCG/2ML IJ SOLN
INTRAMUSCULAR | Status: AC
  Filled 2021-05-30: qty 2

## 2021-05-30 MED ORDER — ACETAMINOPHEN 160 MG/5ML PO SOLN: 1000.0000 mg | Freq: Four times a day (QID) | ORAL | Status: DC

## 2021-05-30 MED ORDER — ACETAMINOPHEN 160 MG/5ML PO SOLN: 1000.0000 mg | Freq: Once | ORAL | Status: DC | PRN

## 2021-05-30 MED ORDER — ATORVASTATIN CALCIUM 80 MG PO TABS
80.0000 mg | ORAL_TABLET | Freq: Every day | ORAL | Status: DC
  Administered 2021-05-31 – 2021-06-02 (×3): 80 mg via ORAL
  Filled 2021-05-30 (×3): qty 1

## 2021-05-30 MED ORDER — CEFAZOLIN SODIUM-DEXTROSE 2-4 GM/100ML-% IV SOLN
2.0000 g | Freq: Three times a day (TID) | INTRAVENOUS | Status: AC
  Administered 2021-05-30 – 2021-05-31 (×2): 2 g via INTRAVENOUS
  Filled 2021-05-30 (×2): qty 100

## 2021-05-30 MED ORDER — SUGAMMADEX SODIUM 200 MG/2ML IV SOLN
INTRAVENOUS | Status: DC | PRN
  Administered 2021-05-30: 50 mg via INTRAVENOUS
  Administered 2021-05-30: 150 mg via INTRAVENOUS

## 2021-05-30 MED ORDER — ACETAMINOPHEN 10 MG/ML IV SOLN
1000.0000 mg | Freq: Once | INTRAVENOUS | Status: DC | PRN
  Administered 2021-05-30: 1000 mg via INTRAVENOUS

## 2021-05-30 MED ORDER — CHLORHEXIDINE GLUCONATE 0.12 % MT SOLN
15.0000 mL | Freq: Once | OROMUCOSAL | Status: AC
  Administered 2021-05-30: 15 mL via OROMUCOSAL
  Filled 2021-05-30: qty 15

## 2021-05-30 MED ORDER — SODIUM CHLORIDE 0.9 % IV SOLN: INTRAVENOUS | Status: DC

## 2021-05-30 MED ORDER — ACETAMINOPHEN 500 MG PO TABS
1000.0000 mg | ORAL_TABLET | Freq: Four times a day (QID) | ORAL | Status: DC
  Administered 2021-05-30 – 2021-06-02 (×8): 1000 mg via ORAL
  Filled 2021-05-30 (×9): qty 2

## 2021-05-30 MED ORDER — PROPOFOL 10 MG/ML IV BOLUS
INTRAVENOUS | Status: AC
  Filled 2021-05-30: qty 20

## 2021-05-30 MED ORDER — ACETAMINOPHEN 10 MG/ML IV SOLN
INTRAVENOUS | Status: AC
  Filled 2021-05-30: qty 100

## 2021-05-30 MED ORDER — IRBESARTAN 150 MG PO TABS
300.0000 mg | ORAL_TABLET | Freq: Every day | ORAL | Status: DC
  Administered 2021-06-02: 300 mg via ORAL
  Filled 2021-05-30 (×3): qty 2

## 2021-05-30 MED ORDER — METOPROLOL SUCCINATE ER 50 MG PO TB24
75.0000 mg | ORAL_TABLET | Freq: Every day | ORAL | Status: DC
  Administered 2021-06-01 – 2021-06-02 (×2): 75 mg via ORAL
  Filled 2021-05-30 (×3): qty 1

## 2021-05-30 MED ORDER — FENTANYL CITRATE (PF) 100 MCG/2ML IJ SOLN
25.0000 ug | INTRAMUSCULAR | Status: DC | PRN
  Administered 2021-05-30 (×4): 25 ug via INTRAVENOUS

## 2021-05-30 MED ORDER — ONDANSETRON HCL 4 MG/2ML IJ SOLN
INTRAMUSCULAR | Status: AC
  Filled 2021-05-30: qty 2

## 2021-05-30 MED ORDER — MIDAZOLAM HCL 2 MG/2ML IJ SOLN
INTRAMUSCULAR | Status: AC
  Filled 2021-05-30: qty 2

## 2021-05-30 MED ORDER — BUPIVACAINE LIPOSOME 1.3 % IJ SUSP
INTRAMUSCULAR | Status: DC | PRN
  Administered 2021-05-30: 50 mL

## 2021-05-30 MED ORDER — FENTANYL CITRATE (PF) 250 MCG/5ML IJ SOLN
INTRAMUSCULAR | Status: DC | PRN
  Administered 2021-05-30 (×3): 50 ug via INTRAVENOUS
  Administered 2021-05-30: 100 ug via INTRAVENOUS

## 2021-05-30 MED ORDER — FENTANYL CITRATE (PF) 250 MCG/5ML IJ SOLN
INTRAMUSCULAR | Status: AC
  Filled 2021-05-30: qty 5

## 2021-05-30 MED ORDER — LACTATED RINGERS IV SOLN: INTRAVENOUS | Status: DC

## 2021-05-30 MED ORDER — HEMOSTATIC AGENTS (NO CHARGE) OPTIME
TOPICAL | Status: DC | PRN
  Administered 2021-05-30 (×2): 1 via TOPICAL

## 2021-05-30 MED ORDER — DEXAMETHASONE SODIUM PHOSPHATE 10 MG/ML IJ SOLN
INTRAMUSCULAR | Status: AC
  Filled 2021-05-30: qty 1

## 2021-05-30 MED ORDER — ENOXAPARIN SODIUM 40 MG/0.4ML IJ SOSY
40.0000 mg | PREFILLED_SYRINGE | Freq: Every day | INTRAMUSCULAR | Status: DC
  Administered 2021-05-30 – 2021-06-01 (×3): 40 mg via SUBCUTANEOUS
  Filled 2021-05-30 (×3): qty 0.4

## 2021-05-30 MED ORDER — DIAZEPAM 5 MG PO TABS: 5.0000 mg | ORAL_TABLET | Freq: Two times a day (BID) | ORAL | Status: DC | PRN

## 2021-05-30 MED ORDER — ALBUTEROL SULFATE HFA 108 (90 BASE) MCG/ACT IN AERS
INHALATION_SPRAY | RESPIRATORY_TRACT | Status: DC | PRN
  Administered 2021-05-30: 4 via RESPIRATORY_TRACT

## 2021-05-30 MED ORDER — ROCURONIUM BROMIDE 10 MG/ML (PF) SYRINGE
PREFILLED_SYRINGE | INTRAVENOUS | Status: DC | PRN
  Administered 2021-05-30: 80 mg via INTRAVENOUS
  Administered 2021-05-30: 20 mg via INTRAVENOUS

## 2021-05-30 MED ORDER — OXYCODONE HCL 5 MG PO TABS
5.0000 mg | ORAL_TABLET | ORAL | Status: DC | PRN
  Administered 2021-05-30: 5 mg via ORAL
  Administered 2021-05-31 – 2021-06-01 (×3): 10 mg via ORAL
  Filled 2021-05-30: qty 2
  Filled 2021-05-30: qty 1
  Filled 2021-05-30 (×2): qty 2

## 2021-05-30 MED ORDER — OXYCODONE HCL 5 MG PO TABS: 5.0000 mg | ORAL_TABLET | Freq: Once | ORAL | Status: DC | PRN

## 2021-05-30 SURGICAL SUPPLY — 115 items
ADH SKN CLS APL DERMABOND .7 (GAUZE/BANDAGES/DRESSINGS) ×2
APPLIER CLIP ROT 10 11.4 M/L (STAPLE)
APR CLP MED LRG 11.4X10 (STAPLE)
BAG TISS RTRVL C300 12X14 (MISCELLANEOUS) ×2
BASKET PULM ZERO TIP 12X120 (MISCELLANEOUS) ×1 IMPLANT
BLADE CLIPPER SURG (BLADE) ×3 IMPLANT
BNDG COHESIVE 6X5 TAN STRL LF (GAUZE/BANDAGES/DRESSINGS) IMPLANT
BSKT SPEC RTRVL ZERO TP 120X12 (MISCELLANEOUS) ×2
CANISTER SUCT 3000ML PPV (MISCELLANEOUS) ×6 IMPLANT
CANNULA REDUC XI 12-8 STAPL (CANNULA) ×6
CANNULA REDUCER 12-8 DVNC XI (CANNULA) ×4 IMPLANT
CATH THORACIC 28FR (CATHETERS) ×1 IMPLANT
CLIP APPLIE ROT 10 11.4 M/L (STAPLE) IMPLANT
CLIP VESOCCLUDE MED 6/CT (CLIP) IMPLANT
CNTNR URN SCR LID CUP LEK RST (MISCELLANEOUS) ×10 IMPLANT
CONN ST 1/4X3/8  BEN (MISCELLANEOUS)
CONN ST 1/4X3/8 BEN (MISCELLANEOUS) IMPLANT
CONT SPEC 4OZ STRL OR WHT (MISCELLANEOUS) ×42
DEFOGGER SCOPE WARMER CLEARIFY (MISCELLANEOUS) ×3 IMPLANT
DERMABOND ADVANCED (GAUZE/BANDAGES/DRESSINGS) ×1
DERMABOND ADVANCED .7 DNX12 (GAUZE/BANDAGES/DRESSINGS) ×2 IMPLANT
DRAIN CHANNEL 28F RND 3/8 FF (WOUND CARE) IMPLANT
DRAIN CHANNEL 32F RND 10.7 FF (WOUND CARE) IMPLANT
DRAPE ARM DVNC X/XI (DISPOSABLE) ×8 IMPLANT
DRAPE COLUMN DVNC XI (DISPOSABLE) ×2 IMPLANT
DRAPE CV SPLIT W-CLR ANES SCRN (DRAPES) ×3 IMPLANT
DRAPE DA VINCI XI ARM (DISPOSABLE) ×12
DRAPE DA VINCI XI COLUMN (DISPOSABLE) ×3
DRAPE HALF SHEET 40X57 (DRAPES) ×3 IMPLANT
DRAPE INCISE IOBAN 66X45 STRL (DRAPES) IMPLANT
DRAPE ORTHO SPLIT 77X108 STRL (DRAPES) ×3
DRAPE SURG ORHT 6 SPLT 77X108 (DRAPES) ×2 IMPLANT
ELECT BLADE 6.5 EXT (BLADE) IMPLANT
ELECT REM PT RETURN 9FT ADLT (ELECTROSURGICAL) ×3
ELECTRODE REM PT RTRN 9FT ADLT (ELECTROSURGICAL) ×2 IMPLANT
GAUZE KITTNER 4X5 RF (MISCELLANEOUS) ×6 IMPLANT
GAUZE SPONGE 4X4 12PLY STRL (GAUZE/BANDAGES/DRESSINGS) ×3 IMPLANT
GLOVE SURG MICRO LTX SZ7.5 (GLOVE) ×6 IMPLANT
GOWN STRL REUS W/ TWL LRG LVL3 (GOWN DISPOSABLE) ×4 IMPLANT
GOWN STRL REUS W/ TWL XL LVL3 (GOWN DISPOSABLE) ×4 IMPLANT
GOWN STRL REUS W/TWL 2XL LVL3 (GOWN DISPOSABLE) ×3 IMPLANT
GOWN STRL REUS W/TWL LRG LVL3 (GOWN DISPOSABLE) ×6
GOWN STRL REUS W/TWL XL LVL3 (GOWN DISPOSABLE) ×6
HEMOSTAT SURGICEL 2X14 (HEMOSTASIS) ×8 IMPLANT
IRRIGATION STRYKERFLOW (MISCELLANEOUS) ×2 IMPLANT
IRRIGATOR STRYKERFLOW (MISCELLANEOUS) ×3
KIT BASIN OR (CUSTOM PROCEDURE TRAY) ×3 IMPLANT
KIT SUCTION CATH 14FR (SUCTIONS) IMPLANT
KIT TURNOVER KIT B (KITS) ×3 IMPLANT
NDL HYPO 25GX1X1/2 BEV (NEEDLE) ×2 IMPLANT
NDL SPNL 22GX3.5 QUINCKE BK (NEEDLE) ×2 IMPLANT
NEEDLE HYPO 25GX1X1/2 BEV (NEEDLE) ×3 IMPLANT
NEEDLE SPNL 22GX3.5 QUINCKE BK (NEEDLE) ×3 IMPLANT
NS IRRIG 1000ML POUR BTL (IV SOLUTION) ×3 IMPLANT
PACK CHEST (CUSTOM PROCEDURE TRAY) ×3 IMPLANT
PAD ARMBOARD 7.5X6 YLW CONV (MISCELLANEOUS) ×6 IMPLANT
PORT ACCESS TROCAR AIRSEAL 12 (TROCAR) ×2 IMPLANT
PORT ACCESS TROCAR AIRSEAL 5M (TROCAR) ×1
RELOAD STAPLE 45 2.5 WHT DVNC (STAPLE) IMPLANT
RELOAD STAPLE 45 3.5 BLU DVNC (STAPLE) IMPLANT
RELOAD STAPLE 45 4.3 GRN DVNC (STAPLE) IMPLANT
RELOAD STAPLE 45 4.6 BLK DVNC (STAPLE) IMPLANT
RELOAD STAPLER 2.5X45 WHT DVNC (STAPLE) ×6 IMPLANT
RELOAD STAPLER 3.5X45 BLU DVNC (STAPLE) ×12 IMPLANT
RELOAD STAPLER 4.3X45 GRN DVNC (STAPLE) ×10 IMPLANT
RELOAD STAPLER 45 4.6 BLK DVNC (STAPLE) ×6 IMPLANT
SCISSORS LAP 5X35 DISP (ENDOMECHANICALS) IMPLANT
SEAL CANN UNIV 5-8 DVNC XI (MISCELLANEOUS) ×4 IMPLANT
SEAL XI 5MM-8MM UNIVERSAL (MISCELLANEOUS) ×6
SEALANT PROGEL (MISCELLANEOUS) IMPLANT
SET TRI-LUMEN FLTR TB AIRSEAL (TUBING) ×3 IMPLANT
SOLUTION ELECTROLUBE (MISCELLANEOUS) ×3 IMPLANT
SPONGE INTESTINAL PEANUT (DISPOSABLE) IMPLANT
SPONGE TONSIL TAPE 1 RFD (DISPOSABLE) IMPLANT
STAPLER 45 SUREFORM CVD (STAPLE) ×6
STAPLER 45 SUREFORM CVD DVNC (STAPLE) IMPLANT
STAPLER CANNULA SEAL DVNC XI (STAPLE) ×4 IMPLANT
STAPLER CANNULA SEAL XI (STAPLE) ×6
STAPLER RELOAD 2.5X45 WHITE (STAPLE) ×9
STAPLER RELOAD 2.5X45 WHT DVNC (STAPLE) ×6
STAPLER RELOAD 3.5X45 BLU DVNC (STAPLE) ×12
STAPLER RELOAD 3.5X45 BLUE (STAPLE) ×18
STAPLER RELOAD 4.3X45 GREEN (STAPLE) ×15
STAPLER RELOAD 4.3X45 GRN DVNC (STAPLE) ×10
STAPLER RELOAD 45 4.6 BLK (STAPLE) ×9
STAPLER RELOAD 45 4.6 BLK DVNC (STAPLE) ×6
SUT PDS AB 3-0 SH 27 (SUTURE) IMPLANT
SUT PROLENE 4 0 RB 1 (SUTURE)
SUT PROLENE 4-0 RB1 .5 CRCL 36 (SUTURE) IMPLANT
SUT SILK  1 MH (SUTURE) ×6
SUT SILK 1 MH (SUTURE) ×4 IMPLANT
SUT SILK 1 TIES 10X30 (SUTURE) IMPLANT
SUT SILK 2 0 SH (SUTURE) IMPLANT
SUT SILK 2 0SH CR/8 30 (SUTURE) IMPLANT
SUT SILK 3 0SH CR/8 30 (SUTURE) IMPLANT
SUT VIC AB 1 CTX 36 (SUTURE)
SUT VIC AB 1 CTX36XBRD ANBCTR (SUTURE) IMPLANT
SUT VIC AB 2-0 CTX 36 (SUTURE) IMPLANT
SUT VIC AB 3-0 MH 27 (SUTURE) IMPLANT
SUT VIC AB 3-0 X1 27 (SUTURE) ×6 IMPLANT
SUT VICRYL 0 TIES 12 18 (SUTURE) ×3 IMPLANT
SUT VICRYL 0 UR6 27IN ABS (SUTURE) ×6 IMPLANT
SUT VICRYL 2 TP 1 (SUTURE) IMPLANT
SYR 20ML LL LF (SYRINGE) ×6 IMPLANT
SYSTEM RETRIEVAL ANCHOR 12 (MISCELLANEOUS) ×1 IMPLANT
SYSTEM SAHARA CHEST DRAIN ATS (WOUND CARE) ×3 IMPLANT
TAPE CLOTH 4X10 WHT NS (GAUZE/BANDAGES/DRESSINGS) ×3 IMPLANT
TAPE CLOTH SURG 4X10 WHT LF (GAUZE/BANDAGES/DRESSINGS) ×1 IMPLANT
TIP APPLICATOR SPRAY EXTEND 16 (VASCULAR PRODUCTS) IMPLANT
TOWEL GREEN STERILE (TOWEL DISPOSABLE) ×6 IMPLANT
TRAY FOLEY MTR SLVR 16FR STAT (SET/KITS/TRAYS/PACK) ×3 IMPLANT
TROCAR BLADELESS 15MM (ENDOMECHANICALS) IMPLANT
TROCAR XCEL 12X100 BLDLESS (ENDOMECHANICALS) IMPLANT
TROCAR XCEL BLADELESS 5X75MML (TROCAR) IMPLANT
WATER STERILE IRR 1000ML POUR (IV SOLUTION) ×3 IMPLANT

## 2021-05-30 NOTE — Anesthesia Procedure Notes (Signed)
Arterial Line Insertion Start/End12/07/2020 7:15 AM, 05/30/2021 7:22 AM Performed by: Betha Loa, CRNA, CRNA  Patient location: Pre-op. Preanesthetic checklist: patient identified, IV checked, site marked, risks and benefits discussed, surgical consent, monitors and equipment checked, pre-op evaluation, timeout performed and anesthesia consent Lidocaine 1% used for infiltration Right, radial was placed Catheter size: 20 G Hand hygiene performed  and maximum sterile barriers used   Attempts: 2 (1st attempt by SRNA, unable to thread wire into artery, 2nd attempt by CRNA) Procedure performed without using ultrasound guided technique. Following insertion, dressing applied and Biopatch.

## 2021-05-30 NOTE — Transfer of Care (Signed)
Immediate Anesthesia Transfer of Care Note  Patient: Laura [name]  Procedure(s) Performed: XI ROBOTIC ASSISTED THORASCOPY-WEDGE RESECTION (Left: Chest) LEFT LOWER LOBECTOMY INTERCOSTAL NERVE BLOCK LYMPH NODE DISSECTION  Patient Location: PACU  Anesthesia Type:General  Level of Consciousness: patient cooperative and responds to stimulation  Airway & Oxygen Therapy: Patient Spontanous Breathing and Patient connected to nasal cannula oxygen  Post-op Assessment: Report given to RN and Post -op Vital signs reviewed and stable  Post vital signs: Reviewed and stable  Last Vitals:  Vitals Value Taken Time  BP 127/58 05/30/21 1109  Temp    Pulse 71 05/30/21 1113  Resp 17 05/30/21 1113  SpO2 94 % 05/30/21 1113  Vitals shown include unvalidated device data.  Last Pain:  Vitals:   05/30/21 0604  TempSrc:   PainSc: 0-No pain      Patients Stated Pain Goal: 3 (62/37/62 8315)  Complications: No notable events documented.

## 2021-05-30 NOTE — Plan of Care (Signed)
  Problem: Education: Goal: Knowledge of General Education information will improve Description: Including pain rating scale, medication(s)/side effects and non-pharmacologic comfort measures Outcome: Progressing   Problem: Clinical Measurements: Goal: Ability to maintain clinical measurements within normal limits will improve Outcome: Progressing Goal: Will remain free from infection Outcome: Progressing Goal: Diagnostic test results will improve Outcome: Progressing Goal: Respiratory complications will improve Outcome: Progressing Goal: Cardiovascular complication will be avoided Outcome: Progressing   Problem: Activity: Goal: Risk for activity intolerance will decrease Outcome: Progressing   Problem: Nutrition: Goal: Adequate nutrition will be maintained Outcome: Progressing   Problem: Coping: Goal: Level of anxiety will decrease Outcome: Progressing   Problem: Pain Managment: Goal: General experience of comfort will improve Outcome: Progressing   Problem: Safety: Goal: Ability to remain free from injury will improve Outcome: Progressing

## 2021-05-30 NOTE — Anesthesia Procedure Notes (Signed)
Procedure Name: Intubation Date/Time: 05/30/2021 7:36 AM Performed by: Betha Loa, CRNA Pre-anesthesia Checklist: Patient identified, Emergency Drugs available, Suction available and Patient being monitored Patient Re-evaluated:Patient Re-evaluated prior to induction Oxygen Delivery Method: Circle System Utilized Preoxygenation: Pre-oxygenation with 100% oxygen Induction Type: IV induction Ventilation: Mask ventilation without difficulty Laryngoscope Size: Mac and 3 Grade View: Grade I Tube type: Oral Endobronchial tube: Double lumen EBT, EBT position confirmed by auscultation and EBT position confirmed by fiberoptic bronchoscope and 35 Fr Number of attempts: 1 Airway Equipment and Method: Stylet, Oral airway and Fiberoptic brochoscope Placement Confirmation: ETT inserted through vocal cords under direct vision, positive ETCO2 and breath sounds checked- equal and bilateral Secured at: 29 cm Tube secured with: Tape Dental Injury: Injury to lip and Teeth and Oropharynx as per pre-operative assessment

## 2021-05-30 NOTE — Discharge Summary (Addendum)
Physician Discharge Summary  Patient ID: [name] MRN: [id number] DOB/AGE: 12/14/1945 75 y.o.  Admit date: 05/30/2021 Discharge date: 06/02/2021  Admission Diagnoses: Patient Active Problem List   Diagnosis Date Noted   S/P lobectomy of lung 05/30/2021   Multiple pulmonary nodules determined by computed tomography of lung 04/18/2021   GERD (gastroesophageal reflux disease) 01/20/2019   Abnormal chest x-ray 10/20/2018   Cough variant asthma vs UACS/vcd 09/19/2018   DOE (dyspnea on exertion) 09/19/2018   Memory loss 10/14/2015   Mixed hyperlipidemia 08/23/2014   Major depressive disorder, recurrent episode, severe ([place]) 09/22/2013   Vitamin D Deficiency 07/28/2013   Medication management 07/28/2013   Prediabetes 07/28/2013   Obstructive sleep apnea 05/27/2013   Arrhythmia 04/14/2013   Palpitations 03/12/2013   Delayed gastric emptying 10/18/2012   Steatohepatitis, nonalcoholic 75/69/4503   Female stress incontinence 09/19/2012   Takotsubo syndrome, June 2011.(normal coronaries) 09/12/2012   Cardiomyopathy- EF 45-50% by echo 09/26/12 09/12/2012   Anxiety disorder  09/12/2012   Essential hypertension 09/12/2012   LBBB (left bundle branch block) 09/12/2012   Obesity 09/12/2012   Sleep apnea- non compliant with C-pap 09/12/2012    Discharge Diagnoses:  Adenocarcinoma left lower lobe, Clinical and Pathologic stage IA (T1cN0) Patient Active Problem List   Diagnosis Date Noted   S/P lobectomy of lung 05/30/2021   Multiple pulmonary nodules determined by computed tomography of lung 04/18/2021   GERD (gastroesophageal reflux disease) 01/20/2019   Abnormal chest x-ray 10/20/2018   Cough variant asthma vs UACS/vcd 09/19/2018   DOE (dyspnea on exertion) 09/19/2018   Memory loss 10/14/2015   Mixed hyperlipidemia 08/23/2014   Major depressive disorder, recurrent episode, severe ([place]) 09/22/2013   Vitamin D Deficiency 07/28/2013   Medication management 07/28/2013   Prediabetes  07/28/2013   Obstructive sleep apnea 05/27/2013   Arrhythmia 04/14/2013   Palpitations 03/12/2013   Delayed gastric emptying 10/18/2012   Steatohepatitis, nonalcoholic 88/82/8003   Female stress incontinence 09/19/2012   Takotsubo syndrome, June 2011.(normal coronaries) 09/12/2012   Cardiomyopathy- EF 45-50% by echo 09/26/12 09/12/2012   Anxiety disorder  09/12/2012   Essential hypertension 09/12/2012   LBBB (left bundle branch block) 09/12/2012   Obesity 09/12/2012   Sleep apnea- non compliant with C-pap 09/12/2012   Adenocarcinoma left lung  S/P lobectomy of lung  Discharged Condition: good  Follow-up appointment requested on 05/30/2021 at 3:45 PM.  History of Present Illness:  Laura [name] is a 75 year old non-smoker with a past medical history significant for non-ST elevation MI secondary to Takotsubo's, left bundle branch block, ischemic cardiomyopathy (EF 35 to 40%), hypertension, hyperlipidemia, hypoglycemia, hypothyroidism, obesity, asthma, anxiety, depression, and memory loss.    Left lower lobe lung nodule-she has a nodule that is clearly increased in size over the past year.  It is hypermetabolic by PET.  There is no evidence of regional or distant metastatic disease.  Findings are consistent with a T1, N0, stage Ia non-small cell carcinoma.  Carcinoid tumors and infectious and inflammatory nodules are also in the differential diagnosis.  We discussed potential options for diagnosis and treatment.  She understands the treatments for this could involve surgical resection versus radiation.  We discussed the advantages and disadvantages of each approach.  She understands they are not equivalent, just alternatives.  I informed her of the proposed surgical procedure which would be a left VATS for wedge resection and then lobectomy if the intraoperative frozen section is positive for carcinoma.  I informed her and her family of the general nature  of the procedure including the  incisions to be used, the need for general anesthesia, the use of a drainage tube postoperatively, the intraoperative decision-making, the expected hospital stay, and the overall recovery.  I informed them of the indications, risk, benefits, and alternatives.  They understand the risks include, but are not limited to death, MI, DVT, PE, bleeding, possible need for transfusion, infection, prolonged air leak, cardiac arrhythmias, as well as possibility of other unforeseeable complications.   Course in Hospital:  Ms. Killman was admitted for elective surgery on 05/30/2021.  She was taken to the operating room where robotic assisted wedge resection of the left lower lobe nodule was accomplished.  This was sent for frozen section and proved to be adenocarcinoma.  Dr. Roxan Hockey proceeded with robotic assisted left lower lobectomy with lymph node dissection.  Following the procedure, the patient was recovered in the postanesthesia care unit and extubated without difficulty.  She was transferred to [facility] progressive care. She understands accepts the risk and agrees to proceed.  Postoperative hospital course:  Patient is doing well.  Chest tube was removed on postoperative day #1 as there was no pneumothorax and only minor drainage.  She is remained hemodynamically stable with some sinus tachycardia.  She has been restarted on her preoperative beta-blocker.  She has had good postoperative urine output.  She did have a reactive leukocytosis with WBC to 14,000.  She has a very minor postoperative expected acute blood loss anemia.  This is very stable.  Oxygen has been weaned and she maintains adequate saturations on room air.  Incisions are noted to be healing well without evidence of infection.  Final pathology is currently pending.  She has been a little slow in regards to physical rehabilitation but is made steady progress.  Addendum: 12/4  The patient's chest tube was removed on 05/31/2021.  Post operative CXR  showed a trace apcial pneumothorax.  She was observed overnight and repeat CXR was obtained.  This showed resolution of pneumothorax.  She has chronic back pain and states that she tolerates hydrocodone better than oxycodone and requests this at discharge.  Her surgical incisions are healing without evidence of infection.  She is medically stable for discharge home today.  Treatments: surgery:   DATE OF PROCEDURE: 05/30/2021   PREOPERATIVE DIAGNOSIS:  Left lower lobe lung nodule.   POSTOPERATIVE DIAGNOSIS:  Adenocarcinoma of the left lower lobe, clinical stage IA (T1, N0).   PROCEDURE PERFORMED:  Xi robotic-assisted thoracoscopy, wedge resection left lower lobe nodule, left lower lobectomy, lymph node dissection and intercostal nerve blocks levels 3 through 10.   SURGEON:  Modesto Charon, MD   ASSISTANT:  Enid Cutter PA-C  Discharge Exam: Blood pressure 116/64, pulse 85, temperature 98.4 F (36.9 C), temperature source Oral, resp. rate 15, height 5\' 3"  (1.6 m), weight 76.2 kg, SpO2 91 %.  General appearance: alert, cooperative, and no distress Heart: regular rate and rhythm Lungs: clear to auscultation bilaterally Abdomen: soft, non-tender; bowel sounds normal; no masses,  no organomegaly Extremities: extremities normal, atraumatic, no cyanosis or edema Wound: clean and dry, some ecchymosis around incision site  Discharge disposition: 01-Home or Self Care  Allergies as of 06/02/2021       Reactions   Codeine Anaphylaxis, Hives, Other (See Comments)   Headache. Daughter reported that it caused her throat to swell up    Ciprofloxacin Nausea And Vomiting   Ace Inhibitors Other (See Comments)   Unknown- it "didn't agree with her"    Citalopram  Other (See Comments)   Fatigue   Fetzima [levomilnacipran] Other (See Comments)   "Talking out of my head"   Lasix [furosemide] Other (See Comments)   HEADACHE   Nsaids Other (See Comments)   Told not to take NSAIDs because of  her heart    Sulfamethoxazole-trimethoprim Other (See Comments)   NERVOUS AND DISORENTED   Xanax Xr [alprazolam Er] Other (See Comments)   confusion        Medication List     TAKE these medications    atorvastatin 80 MG tablet Commonly known as: LIPITOR TAKE 1/2 TABLET BY MOUTH DAILY FOR CHOLESTEROL   diazepam 5 MG tablet Commonly known as: VALIUM Take 1 tablet (5 mg total) by mouth every 12 (twelve) hours as needed for muscle spasms. What changed: how much to take   hydrALAZINE 25 MG tablet Commonly known as: APRESOLINE Take 25 mg by mouth 3 (three) times daily.   HYDROcodone-acetaminophen 5-325 MG tablet Commonly known as: NORCO/VICODIN Take 1 tablet by mouth every 4 (four) hours as needed for moderate pain.   irbesartan 300 MG tablet Commonly known as: AVAPRO Take 300 mg by mouth daily.   levothyroxine 88 MCG tablet Commonly known as: SYNTHROID Take 88 mcg by mouth daily before breakfast.   linaclotide 145 MCG Caps capsule Commonly known as: LINZESS Take 145 mcg by mouth daily as needed (constipation).   Melatonin 3 MG Caps Take 6 mg by mouth at bedtime. At bedtime   metoprolol succinate 50 MG 24 hr tablet Commonly known as: TOPROL-XL TAKE ONE AND ONE-HALF TABLETS BY MOUTH DAILY   Nuvigil 250 MG tablet Generic drug: Armodafinil Take 125 mg by mouth daily as needed (energy boost).   ondansetron 8 MG tablet Commonly known as: ZOFRAN Take 8 mg by mouth every 8 (eight) hours as needed for nausea or vomiting.   pregabalin 25 MG capsule Commonly known as: LYRICA Take 1 capsule (25 mg total) by mouth 2 (two) times daily.        Follow-up Information     Melrose Nakayama, MD. Go on 06/17/2021.   Specialty: Cardiothoracic Surgery Why: Your appointment is at 9:00 Please arrive 30 minutes early for chest x-ray to be performed by [facility] located on the first floor of the same building. Contact information: [address] [place]  [address] [phone]                 Signed:  Ellwood Handler, PA-C 06/02/2021, 8:41 AM

## 2021-05-30 NOTE — Brief Op Note (Addendum)
05/30/2021  10:57 AM  PATIENT:  Laura [name]  75 y.o. female  PRE-OPERATIVE DIAGNOSIS:  LEFT LOWER LOBE NODULE  POST-OPERATIVE DIAGNOSIS: ADENOCARCINOMA LEFT LOWER LOBE, CLINICAL STAGE IA (T1N0)  PROCEDURES:   XI ROBOTIC ASSISTED THORASCOPY-WEDGE RESECTION (Left) LEFT LOWER LOBECTOMY LYMPH NODE DISSECTION INTERCOSTAL NERVE BLOCKS- LEVELS 3-10   SURGEON:  Melrose Nakayama, MD - Primary  PHYSICIAN ASSISTANT: Enid Cutter, PA  ASSISTANTS: Romps, Sharlene Motts, Scrub Person                          Pelance, Bubba Hales, RN, Scrub Person   ANESTHESIA:   general  EBL:  100 mL   BLOOD ADMINISTERED:none  DRAINS:  5fr Blake drain left pleural space    LOCAL MEDICATIONS USED: Exparel local and intecostal  SPECIMEN:  Left upper lung lobe, multiple lymph nodes  DISPOSITION OF SPECIMEN:  PATHOLOGY  COUNTS:  YES  DICTATION: .Dragon Dictation  PLAN OF CARE: Admit to inpatient   PATIENT DISPOSITION:  PACU - hemodynamically stable.   Delay start of Pharmacological VTE agent (>24hrs) due to surgical blood loss or risk of bleeding: no

## 2021-05-30 NOTE — Hospital Course (Addendum)
Follow-up appointment requested on 05/30/2021 at 3:45 PM.  History of Present Illness:  Laura [name] is a 75 year old non-smoker with a past medical history significant for non-ST elevation MI secondary to Takotsubo's, left bundle branch block, ischemic cardiomyopathy (EF 35 to 40%), hypertension, hyperlipidemia, hypoglycemia, hypothyroidism, obesity, asthma, anxiety, depression, and memory loss.    Left lower lobe lung nodule-she has a nodule that is clearly increased in size over the past year.  It is hypermetabolic by PET.  There is no evidence of regional or distant metastatic disease.  Findings are consistent with a T1, N0, stage Ia non-small cell carcinoma.  Carcinoid tumors and infectious and inflammatory nodules are also in the differential diagnosis.  We discussed potential options for diagnosis and treatment.  She understands the treatments for this could involve surgical resection versus radiation.  We discussed the advantages and disadvantages of each approach.  She understands they are not equivalent, just alternatives.  I informed her of the proposed surgical procedure which would be a left VATS for wedge resection and then lobectomy if the intraoperative frozen section is positive for carcinoma.  I informed her and her family of the general nature of the procedure including the incisions to be used, the need for general anesthesia, the use of a drainage tube postoperatively, the intraoperative decision-making, the expected hospital stay, and the overall recovery.  I informed them of the indications, risk, benefits, and alternatives.  They understand the risks include, but are not limited to death, MI, DVT, PE, bleeding, possible need for transfusion, infection, prolonged air leak, cardiac arrhythmias, as well as possibility of other unforeseeable complications.   Course in Hospital:  Laura [name] was admitted for elective surgery on 05/30/2021.  She was taken to the operating room where  robotic assisted wedge resection of the left lower lobe nodule was accomplished.  This was sent for frozen section and proved to be adenocarcinoma.  Dr. Roxan Hockey proceeded with robotic assisted left lower lobectomy with lymph node dissection.  Following the procedure, the patient was recovered in the postanesthesia care unit and extubated without difficulty.  She was transferred to [facility] progressive care. She understands accepts the risk and agrees to proceed.  Postoperative hospital course:  Patient is doing well.  Chest tube was removed on postoperative day #1 as there was no pneumothorax and only minor drainage.  She is remained hemodynamically stable with some sinus tachycardia.  She has been restarted on her preoperative beta-blocker.  She has had good postoperative urine output.  She did have a reactive leukocytosis with WBC to 14,000.  She has a very minor postoperative expected acute blood loss anemia.  This is very stable.  Oxygen has been weaned and she maintains adequate saturations on room air.  Incisions are noted to be healing well without evidence of infection.  Final pathology is currently pending.  She has been a little slow in regards to physical rehabilitation but is made steady progress.  Addendum: 12/4  The patient's chest tube was removed on 05/31/2021.  Post operative CXR showed a trace apcial pneumothorax.  She was observed overnight and repeat CXR was obtained.  This showed resolution of pneumothorax.  She has chronic back pain and states that she tolerates hydrocodone better than oxycodone and requests this at discharge.  She will also be given Lyrica to help with her neuropathic discomfort.  Her surgical incisions are healing without evidence of infection.  She is medically stable for discharge home today.

## 2021-05-30 NOTE — Interval H&P Note (Signed)
History and Physical Interval Note: No additional cardiac workup was needed. PFT show adequate pulmonary reserve  05/30/2021 7:05 AM  Laura [name]  has presented today for surgery, with the diagnosis of LEFT LOWER LOBE NODULE.  The various methods of treatment have been discussed with the patient and family. After consideration of risks, benefits and other options for treatment, the patient has consented to  Procedure(s): XI ROBOTIC ASSISTED THORASCOPY-WEDGE RESECTION, possible lobectomy (Left) as a surgical intervention.  The patient's history has been reviewed, patient examined, no change in status, stable for surgery.  I have reviewed the patient's chart and labs.  Questions were answered to the patient's satisfaction.     Melrose Nakayama

## 2021-05-31 ENCOUNTER — Inpatient Hospital Stay (HOSPITAL_COMMUNITY): Payer: Medicare Other

## 2021-05-31 LAB — CBC
HCT: 37.8 % (ref 36.0–46.0)
Hemoglobin: 11.6 g/dL — ABNORMAL LOW (ref 12.0–15.0)
MCH: 25.3 pg — ABNORMAL LOW (ref 26.0–34.0)
MCHC: 30.7 g/dL (ref 30.0–36.0)
MCV: 82.5 fL (ref 80.0–100.0)
Platelets: 237 10*3/uL (ref 150–400)
RBC: 4.58 MIL/uL (ref 3.87–5.11)
RDW: 14.5 % (ref 11.5–15.5)
WBC: 14.1 10*3/uL — ABNORMAL HIGH (ref 4.0–10.5)
nRBC: 0 % (ref 0.0–0.2)

## 2021-05-31 LAB — BASIC METABOLIC PANEL
Anion gap: 8 (ref 5–15)
BUN: 10 mg/dL (ref 8–23)
CO2: 27 mmol/L (ref 22–32)
Calcium: 8.5 mg/dL — ABNORMAL LOW (ref 8.9–10.3)
Chloride: 101 mmol/L (ref 98–111)
Creatinine, Ser: 0.91 mg/dL (ref 0.44–1.00)
GFR, Estimated: >60 mL/min (ref >60)
Glucose, Bld: 146 mg/dL — ABNORMAL HIGH (ref 70–99)
Potassium: 4.3 mmol/L (ref 3.5–5.1)
Sodium: 136 mmol/L (ref 135–145)

## 2021-05-31 NOTE — Progress Notes (Signed)
Patient monitor alarming. Sats at 84%. Upon arrival, patient was asleep on RA. Woke patient up and sats returned to 95%. Placed patient on 2 Lpm [place] while sleeping.

## 2021-05-31 NOTE — Op Note (Signed)
Laura [name], Laura [name] MEDICAL RECORD NO: [id number] ACCOUNT NO: [id number] DATE OF BIRTH: 1946/01/25 FACILITY: [facility] LOCATION: [facility] PHYSICIAN: Revonda Standard. Roxan Hockey, MD  Operative Report   DATE OF PROCEDURE: 05/30/2021  PREOPERATIVE DIAGNOSIS:  Left lower lobe lung nodule.  POSTOPERATIVE DIAGNOSIS:  Adenocarcinoma of the left lower lobe, clinical stage IA (T1, N0).  PROCEDURE PERFORMED:   Xi robotic-assisted thoracoscopy,  wedge resection left lower lobe nodule,  left lower lobectomy,  lymph node dissection and  intercostal nerve blocks levels 3 through 10.  SURGEON:  Modesto Charon, MD  ASSISTANT:  Enid Cutter, PA.  TYPE OF ANESTHESIA:  General.  FINDINGS:  Nodule clearly visible, lateral basilar left lower lobe.  Frozen section revealed adenocarcinoma.  Bronchial margin negative for tumor.  CLINICAL NOTE:  Laura [name] is a 75 year old nonsmoker who was incidentally noted to have a left lower lobe lung nodule on a CT about a year ago that was done for abdominal issues.  She recently had a followup CT, which showed the nodule had increased in  size and on PET, the nodule was hypermetabolic.  She was advised to undergo robotic left VATS for wedge resection and possible lobectomy.  The indications, risks, benefits, and alternatives were discussed in detail with the patient.  She understood and  accepted the risks and agreed to proceed.  OPERATIVE NOTE:  Laura [name] was brought to the preoperative holding area on 05/30/2021.  Anesthesia established central venous access and placed an arterial blood pressure monitoring line.  She was taken to the operating room and anesthetized and  intubated.  Foley catheter was placed.  Intravenous antibiotics were administered.  Sequential compression devices were placed on the calves for DVT prophylaxis.  She was placed in a right lateral decubitus position.  A Bair Hugger was placed for active  warming.  The left chest was prepped and  draped in the usual sterile fashion.  Single lung ventilation of the right lung was initiated and was tolerated well throughout the procedure.  A timeout was performed.  A solution containing 20 mL of liposomal bupivacaine, 30 mL of 0.5% bupivacaine and 50 mL of saline was prepared.  This was used for local at the incision sites as well as for the intercostal nerve blocks. An incision was made  in the eighth interspace in the mid axillary line, and an 8 mm robotic port was inserted.  The thoracoscope was advanced into the chest. After confirming intrapleural placement, carbon dioxide was insufflated per protocol.  A 12 mm robotic port was placed in  the eighth interspace anteriorly and a 12 mm AirSeal port was placed in the tenth interspace centered between the two anterior arms.  Intercostal nerve blocks then were performed from the third to the tenth interspace.  10 mL of the bupivacaine solution  was injected into a subpleural plane at each level.  Two additional eighth interspace ports then were placed.  The robot was deployed.  The camera arm was docked.  Targeting was performed.  The remaining arms were docked. The robotic instruments were  inserted with thoracoscopic visualization.  Inspection of the lung revealed a nodule with invagination of the visceral pleura in the posterolateral aspect of the right lower lobe near the base. A wedge resection was performed using the robotic stapler, blue and black cartridges were used.  The  specimen was placed into an endoscopic retrieval bag, removed and sent for frozen section.  The nodule was suspicious for lung cancer, so the lymph node dissection  progressed while awaiting the results of the frozen. The lower lobe was retracted  superiorly.  The inferior ligament was divided with bipolar cautery.  Level 9 and 8 nodes were removed.  The pleural reflection was divided at the hilum posteriorly and level 7 nodes were removed.  The pleura was cleared off the  pulmonary artery  posteriorly and working superiorly, the pleura was opened over the aortopulmonary window and several level 5 nodes were removed en bloc.  All lymph nodes that were removed were sent as separate specimens for permanent pathology.  Level 10 node was sent  as well and then the pleural reflection was divided at the hilum anteriorly.  The phrenic nerve was identified.  No cautery was used in its vicinity.  At this point, the frozen section returned showing adenocarcinoma.  The decision was made to proceed  with lobectomy as discussed with the patient preoperatively.  The fissure was incomplete anteriorly.  Two firings of the stapler were used to separate the lingula from the lower lobe and expose the hilum. Level 11 node was identified and removed.   Dissection of the pulmonary artery was difficult, so an attempt was made to dissect out the pulmonary artery in the mid portion of the fissure.  Ultimately, the posterior fissure was more complete and so the fissure was divided posteriorly with bipolar  cautery.  Pulmonary artery was identified and then working from both the anterior and posterior aspects, additional firings of the stapler completed the fissure.  The superior segmental arterial branch was relatively separated from the basilar segmental  branches.  It was encircled and divided with the vascular stapler.  The inferior pulmonary vein then was encircled and divided with a vascular stapler.  After performing additional dissection, removing a node between the bronchus and the basilar  segmental pulmonary arteries, a stapler was placed across the basilar segmental pulmonary arteries, dividing them.  Finally, the stapler was placed across the base of the left lower lobe bronchus at its origin.  This was a green cartridge on the stapler  for this firing. The stapler was closed.  A test inflation showed good aeration of the upper lobe.  The stapler then was fired, transecting the lower lobe  bronchus.  The chest was copiously irrigated with saline.  A test inflation to 30 cm of water pressure, revealed no leakage from the fissure or bronchial stump.  The sponges and vessel loop that had been used during the dissection were removed.  The robotic instruments were removed and the robot was undocked.  The anterior eighth interspace incision was  lengthened to 3 cm.  The 12 mm endoscopic retrieval bag was placed through this incision.  The lower lobe was placed into the bag and the bag was removed. The lower lobe was sent for frozen section of the bronchial margin, which returned with no tumor  seen.  The remaining ports were removed.  Inspection with the thoracoscope showed no bleeding.  A 28-French Blake drain was placed through the original port incision and directed to the apex.  It was secured with #1 silk suture.  Dual lung ventilation  was resumed.  The incisions were closed in standard fashion.  All sponge, needle and instrument counts were correct at the end of the procedure.  The patient was placed back in a supine position.  The chest tube was placed to a Pleur-Evac on waterseal.  She then was extubated in the operating room and taken to the postanesthetic care unit  in good condition.  An experienced assistant was necessary for this procedure due to its complexity.  Enid Cutter served that role and made incisions, placed ports, passed specimens, exchanged instruments and performed wound closure during the course of the procedure.   SHW D: 05/30/2021 6:39:31 pm T: 05/31/2021 4:56:00 am  JOB: [id number]/ [id number]

## 2021-05-31 NOTE — Anesthesia Postprocedure Evaluation (Signed)
Anesthesia Post Note  Patient: Laura [name]  Procedure(s) Performed: XI ROBOTIC ASSISTED THORASCOPY-WEDGE RESECTION (Left: Chest) LEFT LOWER LOBECTOMY INTERCOSTAL NERVE BLOCK LYMPH NODE DISSECTION     Patient location during evaluation: PACU Anesthesia Type: General Level of consciousness: awake and alert Pain management: pain level controlled Vital Signs Assessment: post-procedure vital signs reviewed and stable Respiratory status: spontaneous breathing, nonlabored ventilation, respiratory function stable and patient connected to nasal cannula oxygen Cardiovascular status: blood pressure returned to baseline and stable Postop Assessment: no apparent nausea or vomiting Anesthetic complications: no   No notable events documented.  Last Vitals:  Vitals:   05/31/21 1130 05/31/21 1205  BP: (!) 114/55 101/65  Pulse: 79   Resp: 14   Temp: 36.7 C   SpO2: 96%     Last Pain:  Vitals:   05/31/21 1217  TempSrc:   PainSc: 1                  Edvardo Honse

## 2021-05-31 NOTE — Progress Notes (Addendum)
[address]       [place] [address]             [phone]      1 Day Post-Op Procedure(s) (LRB): XI ROBOTIC ASSISTED THORASCOPY-WEDGE RESECTION (Left) LEFT LOWER LOBECTOMY INTERCOSTAL NERVE BLOCK LYMPH NODE DISSECTION Subjective: C/O of pain with getting out of bed but mostly well controlled  Objective: Vital signs in last 24 hours: Temp:  [97.2 F (36.2 C)-98 F (36.7 C)] 98 F (36.7 C) (12/03 0740) Pulse Rate:  [61-86] 85 (12/03 0740) Cardiac Rhythm: Normal sinus rhythm;Bundle branch block (12/03 0705) Resp:  [12-23] 20 (12/03 0740) BP: (101-133)/(50-69) 101/50 (12/03 0740) SpO2:  [94 %-100 %] 99 % (12/03 0740) Arterial Line BP: (134-172)/(57-73) 160/57 (12/02 1239)  Hemodynamic parameters for last 24 hours:    Intake/Output from previous day: 12/02 0701 - 12/03 0700 In: 1500 [I.V.:1500] Out: 2605 [Urine:2345; Blood:100; Chest Tube:160] Intake/Output this shift: Total I/O In: 240 [P.O.:240] Out: -   General appearance: alert, cooperative, and no distress Heart: regular rate and rhythm Lungs: min dim in bases Abdomen: benign Extremities: warm Wound: ok  Lab Results: Recent Labs    05/31/21 0118  WBC 14.1*  HGB 11.6*  HCT 37.8  PLT 237   BMET:  Recent Labs    05/31/21 0118  NA 136  K 4.3  CL 101  CO2 27  GLUCOSE 146*  BUN 10  CREATININE 0.91  CALCIUM 8.5*    PT/INR: No results for input(s): LABPROT, INR in the last 72 hours. ABG    Component Value Date/Time   PHART 7.408 05/28/2021 1058   HCO3 25.4 05/28/2021 1058   TCO2 27 08/03/2017 1416   O2SAT 95.8 05/28/2021 1058   CBG (last 3)  Recent Labs    05/30/21 0545  GLUCAP 89    Meds Scheduled Meds:  acetaminophen  1,000 mg Oral Q6H   Or   acetaminophen (TYLENOL) oral liquid 160 mg/5 mL  1,000 mg Oral Q6H   atorvastatin  80 mg Oral Daily   bisacodyl  10 mg Oral Daily   Chlorhexidine Gluconate Cloth  6 each Topical Daily   enoxaparin (LOVENOX)  injection  40 mg Subcutaneous Daily   gabapentin  200 mg Oral TID   hydrALAZINE  25 mg Oral TID   irbesartan  300 mg Oral Daily   levothyroxine  88 mcg Oral QAC breakfast   melatonin  6 mg Oral QHS   metoprolol succinate  75 mg Oral Daily   senna-docusate  1 tablet Oral QHS   Continuous Infusions:  sodium chloride 75 mL/hr at 05/30/21 1413   PRN Meds:.diazepam, morphine injection, ondansetron (ZOFRAN) IV, oxyCODONE, traMADol  Xrays DG Chest Port 1 View  Result Date: 05/31/2021 CLINICAL DATA:  Status post lobectomy of lung. EXAM: PORTABLE CHEST 1 VIEW COMPARISON:  Chest x-ray dated 05/30/2021. FINDINGS: Heart size and mediastinal contours are stable. LEFT-sided chest tube is stable in position with tip directed towards the LEFT lung apex. Volume loss at the LEFT lung base related to the given history of recent lobectomy. No significant pleural effusion or pneumothorax is seen. IMPRESSION: Stable chest x-ray. Left-sided chest tube is stable in position. No pneumothorax seen. Electronically Signed   By: Franki Cabot M.D.   On: 05/31/2021 08:42   DG Chest Port 1 View  Result Date: 05/30/2021 CLINICAL DATA:  Post lobectomy of lung EXAM: PORTABLE CHEST 1 VIEW COMPARISON:  05/28/2021 FINDINGS: Left apically directed chest tube.  No pneumothorax. Small volume subcutaneous air in the left chest wall. Shallow inspiration with low lung volumes. Patchy atelectasis. No significant pleural effusion. Normal heart size. IMPRESSION: Left chest tube.  No pneumothorax.  Patchy atelectasis. Electronically Signed   By: Macy Mis M.D.   On: 05/30/2021 13:48    Assessment/Plan: S/P Procedure(s) (LRB): XI ROBOTIC ASSISTED THORASCOPY-WEDGE RESECTION (Left) LEFT LOWER LOBECTOMY INTERCOSTAL NERVE BLOCK LYMPH NODE DISSECTION  1 afeb, VSS sinus rhythm, PVC's, back on metoprolol 2 sats ok on 0-2 liters 3 CT 100 cc, no air leak, will d/c today 4 good UOP- normal renal fxn 5 CXR - no pntx , no sign effus 6  WBC 14.1, reactive leukocytosis 7 H/H  11.6/37.8 8 routine pain control  9 remove foley 10 d/c IVF 11 rehab and pulm hygiene- routine 12 poss home 1-2 days     LOS: 1 day    John Giovanni 05/31/2021   Agree with above Removing [place]

## 2021-06-01 ENCOUNTER — Inpatient Hospital Stay (HOSPITAL_COMMUNITY): Payer: Medicare Other

## 2021-06-01 ENCOUNTER — Encounter (HOSPITAL_COMMUNITY): Payer: Self-pay | Admitting: Thoracic Surgery (Cardiothoracic Vascular Surgery)

## 2021-06-01 LAB — COMPREHENSIVE METABOLIC PANEL
ALT: 12 U/L (ref 0–44)
AST: 19 U/L (ref 15–41)
Albumin: 2.9 g/dL — ABNORMAL LOW (ref 3.5–5.0)
Alkaline Phosphatase: 58 U/L (ref 38–126)
Anion gap: 3 — ABNORMAL LOW (ref 5–15)
BUN: 9 mg/dL (ref 8–23)
CO2: 28 mmol/L (ref 22–32)
Calcium: 8.2 mg/dL — ABNORMAL LOW (ref 8.9–10.3)
Chloride: 104 mmol/L (ref 98–111)
Creatinine, Ser: 0.72 mg/dL (ref 0.44–1.00)
GFR, Estimated: >60 mL/min (ref >60)
Glucose, Bld: 157 mg/dL — ABNORMAL HIGH (ref 70–99)
Potassium: 3.9 mmol/L (ref 3.5–5.1)
Sodium: 135 mmol/L (ref 135–145)
Total Bilirubin: 0.1 mg/dL — ABNORMAL LOW (ref 0.3–1.2)
Total Protein: 5.6 g/dL — ABNORMAL LOW (ref 6.5–8.1)

## 2021-06-01 LAB — CBC
HCT: 35.9 % — ABNORMAL LOW (ref 36.0–46.0)
Hemoglobin: 11 g/dL — ABNORMAL LOW (ref 12.0–15.0)
MCH: 25.6 pg — ABNORMAL LOW (ref 26.0–34.0)
MCHC: 30.6 g/dL (ref 30.0–36.0)
MCV: 83.5 fL (ref 80.0–100.0)
Platelets: 219 10*3/uL (ref 150–400)
RBC: 4.3 MIL/uL (ref 3.87–5.11)
RDW: 14.6 % (ref 11.5–15.5)
WBC: 14.8 10*3/uL — ABNORMAL HIGH (ref 4.0–10.5)
nRBC: 0 % (ref 0.0–0.2)

## 2021-06-01 MED ORDER — MAGNESIUM HYDROXIDE 400 MG/5ML PO SUSP
30.0000 mL | Freq: Every day | ORAL | Status: DC | PRN
  Administered 2021-06-01: 10:00:00 30 mL via ORAL
  Filled 2021-06-01: qty 30

## 2021-06-01 NOTE — Progress Notes (Signed)
   06/01/21 0304  Assess: MEWS Score  Temp 98.7 F (37.1 C)  BP 125/60  Pulse Rate (!) 109  ECG Heart Rate (!) 111  Resp 19  SpO2 95 %  O2 Device Nasal Cannula  Assess: MEWS Score  MEWS Temp 0  MEWS Systolic 0  MEWS Pulse 2  MEWS RR 0  MEWS LOC 0  MEWS Score 2  MEWS Score Color Yellow  Assess: if the MEWS score is Yellow or Red  Were vital signs taken at a resting state? Yes  Focused Assessment Change from prior assessment (see assessment flowsheet)  Early Detection of Sepsis Score *See Row Information* Low  MEWS guidelines implemented *See Row Information* Yes  Treat  MEWS Interventions Escalated (See documentation below)  Take Vital Signs  Increase Vital Sign Frequency  Yellow: Q 2hr X 2 then Q 4hr X 2, if remains yellow, continue Q 4hrs  Escalate  MEWS: Escalate Yellow: discuss with charge nurse/RN and consider discussing with provider and RRT  Notify: Charge Nurse/RN  Name of Charge Nurse/RN Notified Enid Derry, Therapist, sports (This is the Charge RNs pt)  Date Charge Nurse/RN Notified 06/01/21  Time Charge Nurse/RN Notified 0304

## 2021-06-01 NOTE — Progress Notes (Addendum)
[address]       [place] [address]             [phone]      2 Days Post-Op Procedure(s) (LRB): XI ROBOTIC ASSISTED THORASCOPY-WEDGE RESECTION (Left) LEFT LOWER LOBECTOMY INTERCOSTAL NERVE BLOCK LYMPH NODE DISSECTION Subjective: C/o back pain, soreness from incisions   Objective: Vital signs in last 24 hours: Temp:  [98 F (36.7 C)-98.7 F (37.1 C)] 98.2 F (36.8 C) (12/04 7673) Pulse Rate:  [70-110] 98 (12/04 4193) Cardiac Rhythm: Sinus tachycardia (12/04 0300) Resp:  [14-20] 16 (12/04 0632) BP: (101-140)/(50-65) 113/64 (12/04 0632) SpO2:  [94 %-100 %] 97 % (12/04 7902)  Hemodynamic parameters for last 24 hours:    Intake/Output from previous day: 12/03 0701 - 12/04 0700 In: 960 [P.O.:960] Out: 1000 [Urine:1000] Intake/Output this shift: No intake/output data recorded.  General appearance: alert, cooperative, and no distress Heart: regular rate and rhythm and tachy Lungs: slightly coarse in lower fields Abdomen: + BS, mild distension, minor tenderness to palp Extremities: no edema or calf tenderness Wound: incis healing well  Lab Results: Recent Labs    05/31/21 0118 06/01/21 0146  WBC 14.1* 14.8*  HGB 11.6* 11.0*  HCT 37.8 35.9*  PLT 237 219   BMET:  Recent Labs    05/31/21 0118 06/01/21 0146  NA 136 135  K 4.3 3.9  CL 101 104  CO2 27 28  GLUCOSE 146* 157*  BUN 10 9  CREATININE 0.91 0.72  CALCIUM 8.5* 8.2*    PT/INR: No results for input(s): LABPROT, INR in the last 72 hours. ABG    Component Value Date/Time   PHART 7.408 05/28/2021 1058   HCO3 25.4 05/28/2021 1058   TCO2 27 08/03/2017 1416   O2SAT 95.8 05/28/2021 1058   CBG (last 3)  Recent Labs    05/30/21 0545  GLUCAP 89    Meds Scheduled Meds:  acetaminophen  1,000 mg Oral Q6H   Or   acetaminophen (TYLENOL) oral liquid 160 mg/5 mL  1,000 mg Oral Q6H   atorvastatin  80 mg Oral Daily   bisacodyl  10 mg Oral Daily   Chlorhexidine Gluconate Cloth   6 each Topical Daily   enoxaparin (LOVENOX) injection  40 mg Subcutaneous Daily   gabapentin  200 mg Oral TID   hydrALAZINE  25 mg Oral TID   irbesartan  300 mg Oral Daily   levothyroxine  88 mcg Oral QAC breakfast   melatonin  6 mg Oral QHS   metoprolol succinate  75 mg Oral Daily   senna-docusate  1 tablet Oral QHS   Continuous Infusions: PRN Meds:.diazepam, morphine injection, ondansetron (ZOFRAN) IV, oxyCODONE, traMADol  Xrays DG Chest 2 View  Result Date: 05/31/2021 CLINICAL DATA:  Follow-up chest tube removal EXAM: CHEST - 2 VIEW COMPARISON:  05/31/2021 FINDINGS: Cardiac shadow is enlarged but stable. Left-sided chest tube has been removed in the interval. Subcutaneous emphysema is noted. No sizable pneumothorax is seen. Elevation of left hemidiaphragm is again noted. No focal infiltrate or effusion is seen. No bony abnormality is noted. IMPRESSION: No significant recurrent pneumothorax following chest tube removal. Electronically Signed   By: Inez Catalina M.D.   On: 05/31/2021 19:12   DG Chest Port 1 View  Result Date: 05/31/2021 CLINICAL DATA:  Status post lobectomy of lung. EXAM: PORTABLE CHEST 1 VIEW COMPARISON:  Chest x-ray dated 05/30/2021. FINDINGS: Heart size and mediastinal contours are stable. LEFT-sided chest tube is stable in  position with tip directed towards the LEFT lung apex. Volume loss at the LEFT lung base related to the given history of recent lobectomy. No significant pleural effusion or pneumothorax is seen. IMPRESSION: Stable chest x-ray. Left-sided chest tube is stable in position. No pneumothorax seen. Electronically Signed   By: Franki Cabot M.D.   On: 05/31/2021 08:42   DG Chest Port 1 View  Result Date: 05/30/2021 CLINICAL DATA:  Post lobectomy of lung EXAM: PORTABLE CHEST 1 VIEW COMPARISON:  05/28/2021 FINDINGS: Left apically directed chest tube. No pneumothorax. Small volume subcutaneous air in the left chest wall. Shallow inspiration with low lung  volumes. Patchy atelectasis. No significant pleural effusion. Normal heart size. IMPRESSION: Left chest tube.  No pneumothorax.  Patchy atelectasis. Electronically Signed   By: Macy Mis M.D.   On: 05/30/2021 13:48    Assessment/Plan: S/P Procedure(s) (LRB): XI ROBOTIC ASSISTED THORASCOPY-WEDGE RESECTION (Left) LEFT LOWER LOBECTOMY INTERCOSTAL NERVE BLOCK LYMPH NODE DISSECTION  1 afeb, s BP 101-140, sinus tachy 2 sats ok on 2 liters-wean, routine pulm hygiene 3 good UOP, normal creat 4 fairly stable leukocytosis, WBC 14.8, most likely reactive 5 minor ABLA- monitor clinically 6 CXR some hilar fullness- post operative changes, no/tiny pntx or effusions, minimal atx asd. Left base 7 chronic back pain limiting progress a little 9 chronic constipation, will add MOM- says she uses daily enemas Probably needs to see GI as outpatient 10 mobilize, cont current pain meds 11 hopefully home in am   LOS: 2 days    John Giovanni PA-C Pager [phone] 06/01/2021    Complains of back and incisional pain Small pneumoT on CXR Remains on some O2 Will repeat CXR tomorrow, likely home tomorrow  Lajuana Matte

## 2021-06-01 NOTE — Plan of Care (Signed)
  Problem: Education: Goal: Knowledge of General Education information will improve Description: Including pain rating scale, medication(s)/side effects and non-pharmacologic comfort measures Outcome: Progressing   Problem: Health Behavior/Discharge Planning: Goal: Ability to manage health-related needs will improve Outcome: Progressing   Problem: Clinical Measurements: Goal: Will remain free from infection Outcome: Progressing   Problem: Clinical Measurements: Goal: Diagnostic test results will improve Outcome: Progressing   Problem: Clinical Measurements: Goal: Respiratory complications will improve Outcome: Progressing   Problem: Activity: Goal: Risk for activity intolerance will decrease Outcome: Progressing   Problem: Nutrition: Goal: Adequate nutrition will be maintained Outcome: Progressing   Problem: Coping: Goal: Level of anxiety will decrease Outcome: Progressing   Problem: Elimination: Goal: Will not experience complications related to bowel motility Outcome: Progressing Goal: Will not experience complications related to urinary retention Outcome: Progressing   Problem: Pain Managment: Goal: General experience of comfort will improve Outcome: Progressing   Problem: Safety: Goal: Ability to remain free from injury will improve Outcome: Progressing   Problem: Skin Integrity: Goal: Risk for impaired skin integrity will decrease Outcome: Progressing   Problem: Education: Goal: Knowledge of disease or condition will improve Outcome: Progressing Goal: Knowledge of the prescribed therapeutic regimen will improve Outcome: Progressing   Problem: Clinical Measurements: Goal: Postoperative complications will be avoided or minimized Outcome: Progressing   Problem: Respiratory: Goal: Respiratory status will improve Outcome: Progressing   Problem: Skin Integrity: Goal: Wound healing without signs and symptoms infection will improve Outcome:  Progressing

## 2021-06-02 ENCOUNTER — Inpatient Hospital Stay (HOSPITAL_COMMUNITY): Payer: Medicare Other

## 2021-06-02 LAB — SURGICAL PATHOLOGY

## 2021-06-02 MED ORDER — PREGABALIN 25 MG PO CAPS
25.0000 mg | ORAL_CAPSULE | Freq: Two times a day (BID) | ORAL | Status: DC
  Administered 2021-06-02: 25 mg via ORAL
  Filled 2021-06-02: qty 1

## 2021-06-02 MED ORDER — PREGABALIN 25 MG PO CAPS: 25.0000 mg | ORAL_CAPSULE | Freq: Two times a day (BID) | ORAL | 0 refills | Status: DC

## 2021-06-02 MED ORDER — GUAIFENESIN ER 600 MG PO TB12: 600.0000 mg | ORAL_TABLET | Freq: Two times a day (BID) | ORAL | Status: DC | PRN

## 2021-06-02 MED ORDER — HYDROCODONE-ACETAMINOPHEN 5-325 MG PO TABS: 1.0000 | ORAL_TABLET | ORAL | 0 refills | Status: DC | PRN

## 2021-06-02 NOTE — Care Management Important Message (Signed)
Important Message  Patient Details  Name: Laura [name] MRN: [id number] Date of Birth: [birth year]   Medicare Important Message Given:  Yes     Justinian Miano 06/02/2021, 3:00 PM

## 2021-06-02 NOTE — Progress Notes (Addendum)
      [address]       [place] [address]             [phone]      3 Days Post-Op Procedure(s) (LRB): XI ROBOTIC ASSISTED THORASCOPY-WEDGE RESECTION (Left) LEFT LOWER LOBECTOMY INTERCOSTAL NERVE BLOCK LYMPH NODE DISSECTION  Subjective:  Patient continues to complain of pain.  She states she needs to cough which is difficult due to discomfort.  She also woke up disoriented last night, and thought it was daytime.   Objective: Vital signs in last 24 hours: Temp:  [98.2 F (36.8 C)-100.6 F (38.1 C)] 98.4 F (36.9 C) (12/05 0322) Pulse Rate:  [84-114] 85 (12/05 0322) Cardiac Rhythm: Normal sinus rhythm;Bundle branch block (12/05 0700) Resp:  [15-20] 15 (12/05 0322) BP: (100-143)/(58-72) 116/64 (12/05 0322) SpO2:  [91 %-99 %] 91 % (12/05 0322)  Intake/Output from previous day: 12/04 0701 - 12/05 0700 In: 600 [P.O.:600] Out: -   General appearance: alert, cooperative, and no distress Heart: regular rate and rhythm Lungs: clear to auscultation bilaterally Abdomen: soft, non-tender; bowel sounds normal; no masses,  no organomegaly Extremities: extremities normal, atraumatic, no cyanosis or edema Wound: clean and dry, some ecchymosis around incision site  Lab Results: Recent Labs    05/31/21 0118 06/01/21 0146  WBC 14.1* 14.8*  HGB 11.6* 11.0*  HCT 37.8 35.9*  PLT 237 219   BMET:  Recent Labs    05/31/21 0118 06/01/21 0146  NA 136 135  K 4.3 3.9  CL 101 104  CO2 27 28  GLUCOSE 146* 157*  BUN 10 9  CREATININE 0.91 0.72  CALCIUM 8.5* 8.2*    PT/INR: No results for input(s): LABPROT, INR in the last 72 hours. ABG    Component Value Date/Time   PHART 7.408 05/28/2021 1058   HCO3 25.4 05/28/2021 1058   TCO2 27 08/03/2017 1416   O2SAT 95.8 05/28/2021 1058   CBG (last 3)  No results for input(s): GLUCAP in the last 72 hours.  Assessment/Plan: S/P Procedure(s) (LRB): XI ROBOTIC ASSISTED THORASCOPY-WEDGE RESECTION (Left) LEFT LOWER  LOBECTOMY INTERCOSTAL NERVE BLOCK LYMPH NODE DISSECTION  CV- NSR, H/O HTN- continue Hydralazine, Avapro, Toprol Pulm- no acute issues, CXR w/o significant pneumothorax, improved from yesterdays film Pain control- not much relief with Neurontin, will d/c start Lyrica, has chronic back pain prior to admission GI- chronic constipation, uses Lin[facility] as outpatient Dispo- patient stable, CXR is free from significant pneumothorax, patient is medically stable for discharge home today   LOS: 3 days    Ellwood Handler, PA-C 06/02/2021  Agree with above Home today  [place]

## 2021-06-02 NOTE — Care Management Important Message (Signed)
Important Message  Patient Details  Name: Laura [name] MRN: [id number] Date of Birth: 16-Jul-1945   Medicare Important Message Given:  Yes  CORRECTION PATIENT LEFT PRIOR TO IM DELIVERY WILL MAIL TO PATIENT HOME ADDRESS.    [name] 06/02/2021, 3:20 PM

## 2021-06-12 ENCOUNTER — Telehealth: Payer: Self-pay

## 2021-06-12 NOTE — Telephone Encounter (Signed)
Patient contacted the office requesting pathology results. She is s/p RATS/ wedge LLLobectomy with Dr. Roxan Hockey 05/30/21. Advised that patient does have a follow-up appointment with Dr. Roxan Hockey to discuss results Tuesday 06/17/21. She stated that she did not want to wait for them as she has been waiting for over two weeks at this point. Advised that Dr. Roxan Hockey would be made aware of patient's request. She acknowledged receipt.   She states that she would not have done this surgery if she would've known "that I would be going through this hell". She also states that she is having constipation problems as she is taking a lot of pain medication and the only thing that will help is an enema. She states that she has tried Miralax and stool softeners in the past and that they do not work. Advised that it sounds like she may have neuropathic type post-op pain and that Dr. Roxan Hockey could discuss a different type of medication at her appointment Tuesday to see if that helps her.  Patient stated that she has not contacted the office since surgery about this pain but did call about itching. Advised that I could also let Dr. Roxan Hockey know about her pain but patient stated that she would wait to discuss that at her next appt.   Message sent to Dr. Roxan Hockey for further review and advise.

## 2021-06-13 ENCOUNTER — Telehealth: Payer: Self-pay | Admitting: Thoracic Surgery (Cardiothoracic Vascular Surgery)

## 2021-06-13 MED ORDER — GABAPENTIN 100 MG PO CAPS: 100.0000 mg | ORAL_CAPSULE | Freq: Three times a day (TID) | ORAL | 3 refills | Status: DC

## 2021-06-13 MED ORDER — HYDROCODONE-ACETAMINOPHEN 5-325 MG PO TABS: 1.0000 | ORAL_TABLET | ORAL | 0 refills | Status: DC | PRN

## 2021-06-13 NOTE — Telephone Encounter (Signed)
Mrs. Vanderslice called with complaints of pain, pins and needles and itching in left chest. Also wanted path report.  She has been taking hydrocodone for pain with only minimal relief. Refused oxycodone at dc due to dissociation side effects.  She has not been able to sleep and apparently called office earlier this week.  Unfortunately I did not get message to call until this afternoon.  She asked about a stronger pain medication but does not want to change due to the possibility of side effects  Has been on low dose Lyrica.  Has taken gabapentin in past for back pain with some success.  Will dc Lyrica (pregabalin), resume gabapentin at 100 mg PO TID New prescription for hydrocodone  Informed path- stage IA  Appointment scheduled for Tuesday  Rikki Trosper C. Roxan Hockey, MD [facility] [phone]

## 2021-06-16 ENCOUNTER — Other Ambulatory Visit: Payer: Self-pay | Admitting: Thoracic Surgery (Cardiothoracic Vascular Surgery)

## 2021-06-16 DIAGNOSIS — Z902 Acquired absence of lung [part of]: Secondary | ICD-10-CM

## 2021-06-17 ENCOUNTER — Ambulatory Visit (INDEPENDENT_AMBULATORY_CARE_PROVIDER_SITE_OTHER): Payer: Self-pay | Admitting: Thoracic Surgery (Cardiothoracic Vascular Surgery)

## 2021-06-17 ENCOUNTER — Ambulatory Visit
Admission: RE | Admit: 2021-06-17 | Discharge: 2021-06-17 | Disposition: A | Discharge status: Home / Self Care | Payer: Medicare Other | Source: Ambulatory Visit | Attending: Thoracic Surgery (Cardiothoracic Vascular Surgery) | Admitting: Thoracic Surgery (Cardiothoracic Vascular Surgery)

## 2021-06-17 ENCOUNTER — Other Ambulatory Visit: Payer: Self-pay

## 2021-06-17 VITALS — BP 150/77 | HR 64 | Resp 20 | Ht 63.0 in | Wt 168.0 lb

## 2021-06-17 DIAGNOSIS — J90 Pleural effusion, not elsewhere classified: Secondary | ICD-10-CM | POA: Diagnosis not present

## 2021-06-17 DIAGNOSIS — Z902 Acquired absence of lung [part of]: Secondary | ICD-10-CM

## 2021-06-17 DIAGNOSIS — J9811 Atelectasis: Secondary | ICD-10-CM | POA: Diagnosis not present

## 2021-06-17 MED ORDER — GABAPENTIN 300 MG PO CAPS: 300.0000 mg | ORAL_CAPSULE | Freq: Three times a day (TID) | ORAL | 5 refills | Status: DC

## 2021-06-17 MED ORDER — HYDROCODONE-ACETAMINOPHEN 5-325 MG PO TABS: 1.0000 | ORAL_TABLET | Freq: Four times a day (QID) | ORAL | 0 refills | Status: DC | PRN

## 2021-06-17 NOTE — Patient Instructions (Signed)
Increase gabapentin to 200 mg by mouth three time daily for 5 days then increase to 300 mg three times a day.  Increase hydrocodone to 1-2 tablets 4 times daily as needed for pain.

## 2021-06-17 NOTE — Progress Notes (Signed)
[address]       [place] [address]             [phone]     HPI: Mrs. Borrero returns for scheduled follow-up visit after her recent lobectomy.  Lucyle Alumbaugh is a 75 year old woman with a past history of Takotsubo's with non-ST elevation MI, left bundle branch block, ischemic cardiomyopathy, hypertension, hyperlipidemia, hypoglycemia, hypothyroidism, obesity, anxiety, depression, asthma, and memory loss.  She is a lifelong non-smoker.  She was incidentally found to have a lung nodule back in 2021.  On a follow-up that increased in size and it was hypermetabolic on PET/CT.  I did a robotic assisted wedge resection followed by left lower lobectomy after frozen section confirmed adenocarcinoma on 05/30/2021.  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  Postoperatively she had some issues with pain but went home on 06/02/2021.  She continues to have a lot of pain over the incisions.  She complained of itching at the incisions.  She was unable to sleep.  She admits at home on Lyrica but had taken gabapentin in the past.  I talked her on the phone last week and we started her back on gabapentin 100 mg p.o. 3 times daily and stopped the Lyrica.  She is taking hydrocodone 5/325 about 3 times a day.  Past Medical History:  Diagnosis Date   Anemia    Anxiety    Asthma    related to sesonal allergies   Chronic combined systolic and diastolic CHF, NYHA class 2 ([place]) CARDIOLOGIST-  DR KGYJEHUD   Coronary artery disease    Depression    History of kidney stones    History of non-ST elevation myocardial infarction (NSTEMI) 11/27/2009   SECONDARY TO TAKOTSUDO SYNDROME (CARDIAC CATH NORMAL)   Hyperlipemia    Hypertension    Hypoglycemia    Hypothyroidism    LBBB (left bundle branch block)    Left ventricular ejection fraction less than 40%    38% PER CARDIOLOGIST NOTE (DR CROITORU)   Memory loss    Mood swings    Myocardial infarction ([place]) 2011    Nonischemic dilated cardiomyopathy ([place])    MODERATELY DEPRESSED LVF;EF 35-45% by Echo 05/27/11   OSA (obstructive sleep apnea) MODERATE PER STUDY 2005   CPAP NONCOMPLIANT   Pre-diabetes    Seasonal allergies    SUI (stress urinary incontinence, female)     Current Outpatient Medications  Medication Sig Dispense Refill   atorvastatin (LIPITOR) 80 MG tablet TAKE 1/2 TABLET BY MOUTH DAILY FOR CHOLESTEROL 45 tablet 3   diazepam (VALIUM) 5 MG tablet Take 1 tablet (5 mg total) by mouth every 12 (twelve) hours as needed for muscle spasms. (Patient taking differently: Take 10 mg by mouth every 12 (twelve) hours as needed for muscle spasms.) 10 tablet 0   hydrALAZINE (APRESOLINE) 25 MG tablet Take 25 mg by mouth 3 (three) times daily.     irbesartan (AVAPRO) 300 MG tablet Take 300 mg by mouth daily.     levothyroxine (SYNTHROID, LEVOTHROID) 88 MCG tablet Take 88 mcg by mouth daily before breakfast.     linaclotide (LINZESS) 145 MCG CAPS capsule Take 145 mcg by mouth daily as needed (constipation).     Melatonin 3 MG CAPS Take 6 mg by mouth at bedtime. At bedtime     metoprolol succinate (TOPROL-XL) 50 MG 24 hr tablet TAKE ONE AND ONE-HALF TABLETS BY MOUTH DAILY 90 tablet 3   NUVIGIL  250 MG tablet Take 125 mg by mouth daily as needed (energy boost).  3   ondansetron (ZOFRAN) 8 MG tablet Take 8 mg by mouth every 8 (eight) hours as needed for nausea or vomiting.     gabapentin (NEURONTIN) 300 MG capsule Take 1 capsule (300 mg total) by mouth 3 (three) times daily. 90 capsule 5   HYDROcodone-acetaminophen (NORCO/VICODIN) 5-325 MG tablet Take 1-2 tablets by mouth every 6 (six) hours as needed for moderate pain. 30 tablet 0   No current facility-administered medications for this visit.    Physical Exam BP (!) 150/77 (BP Location: Right Arm, Patient Position: Sitting)    Pulse 64    Resp 20    Ht 5\' 3"  (1.6 m)    Wt 168 lb (76.2 kg)    SpO2 100% Comment: RA   BMI 29.63 kg/m  75 year old woman in no  acute distress but obvious discomfort. Alert and oriented x3 Lungs diminished at left base but otherwise clear Incisions clean dry intact Cardiac regular rate and rhythm  Diagnostic Tests: I personally reviewed her chest x-ray.  Shows postoperative changes.  Impression: Christianne Zacher is a 75 year old woman with a past history of Takotsubo's with non-ST elevation MI, left bundle branch block, ischemic cardiomyopathy, hypertension, hyperlipidemia, hypoglycemia, hypothyroidism, obesity, anxiety, depression, asthma, and memory loss.    She was incidentally found to have a lung nodule about a year ago.  More recently that nodule had increased in size.  It was hypermetabolic on PET so we recommended surgical resection.  She underwent a robotic left lower lobectomy for stage Ia adenocarcinoma on 05/30/2021.  Her initial postoperative course was uncomplicated and she went home on day 3.  She continues to have problems with intercostal neuralgia.  She feels like hydrocodone is partially effective but not strong as she would like.  We have discussed oxycodone previously and she has had problems with a dissociative sensation with that.  I discussed possibly trying something different like Dilaudid, but informed her that all narcotics can have that type of reaction.  Since she feels like the hydrocodone is helping and not causing that sensation, I think the best option is to just increase the dose of that.  We will change that to hydrocodone 5/325 1-2 tablets p.o. every 6 hours as needed.  She is tolerating the 100 mg of gabapentin 3 times daily.  She is going to increase that to 200 mg 3 times daily for 5 days and then if not having any issues with that she will increase to 300 mg 3 times daily.  They could try either hydrocortisone or Mederma to see if that helps with the itching.  I again reviewed the path with him.  She has stage Ia disease.  She will not need any adjuvant chemo or radiation but will  need oncology consultation and long-term follow-up.  Plan: Increase hydrocodone and gabapentin as noted above Referral to Dr. Julien Nordmann for new stage Ia primary lung adenocarcinoma I will see her back in about 3 weeks with a repeat chest x-ray.  Melrose Nakayama, MD [facility] [phone]

## 2021-06-24 ENCOUNTER — Encounter: Payer: Self-pay | Admitting: *Deleted

## 2021-06-24 ENCOUNTER — Ambulatory Visit (HOSPITAL_COMMUNITY)
Admission: EM | Admit: 2021-06-24 | Discharge: 2021-06-24 | Disposition: A | Discharge status: Home / Self Care | Payer: Medicare Other | Attending: Psychiatry | Admitting: Psychiatry

## 2021-06-24 DIAGNOSIS — F332 Major depressive disorder, recurrent severe without psychotic features: Secondary | ICD-10-CM | POA: Diagnosis not present

## 2021-06-24 DIAGNOSIS — I10 Essential (primary) hypertension: Secondary | ICD-10-CM | POA: Diagnosis not present

## 2021-06-24 DIAGNOSIS — F419 Anxiety disorder, unspecified: Secondary | ICD-10-CM | POA: Insufficient documentation

## 2021-06-24 DIAGNOSIS — I255 Ischemic cardiomyopathy: Secondary | ICD-10-CM | POA: Diagnosis not present

## 2021-06-24 DIAGNOSIS — M7918 Myalgia, other site: Secondary | ICD-10-CM | POA: Diagnosis not present

## 2021-06-24 DIAGNOSIS — R45851 Suicidal ideations: Secondary | ICD-10-CM | POA: Diagnosis not present

## 2021-06-24 DIAGNOSIS — Z902 Acquired absence of lung [part of]: Secondary | ICD-10-CM

## 2021-06-24 MED ORDER — MIRTAZAPINE 15 MG PO TABS: 7.5000 mg | ORAL_TABLET | Freq: Every day | ORAL | 0 refills | Status: DC

## 2021-06-24 NOTE — ED Notes (Signed)
Patient discharged by NP. Patient received After Visit Summary with follow up instructions and community resources. Patient left with husband.

## 2021-06-24 NOTE — Discharge Instructions (Addendum)

## 2021-06-24 NOTE — Progress Notes (Signed)
Oncology Nurse Navigator Documentation  Oncology Nurse Navigator Flowsheets 06/24/2021  Confirmed Diagnosis Date 05/30/2021  Diagnosis Status Confirmed Diagnosis Complete  Planned Course of Treatment Surgery  Phase of Treatment Surgery  Surgery Actual Start Date: 05/30/2021  Navigator Follow Up Date: 06/27/2021  Navigator Follow Up Reason: Appointment Review  Navigator Location [facility]  Navigator Encounter Type Other:  Treatment Initiated Date 05/30/2021  Patient Visit Type Other  Barriers/Navigation Needs Coordination of Care/I received message that Ms. Seifried has been referred to see Dr. Julien Nordmann. I notified new patient coordinator to call and schedule patient to be seen on 1/10 with labs.   Interventions Coordination of Care  Acuity Level 2-Minimal Needs (1-2 Barriers Identified)  Coordination of Care Other  Time Spent with Patient 30

## 2021-06-24 NOTE — Progress Notes (Signed)
°   06/24/21 1105  [place] (Walk-ins at [facility] only)  What Is the Reason for Your Visit/Call Today? 75 year old Laura [name] presents to [facility] with her husband with compliant of depression related to recent operation to remove cancer. Patient stated, "I don't know what to say. I have no life." Husband report since October patient has had pain with her back. She has feels overwhelmed, anxiety, depressed triggered to pain and operation. Patient report she's can't sleep and she has always had anxiety. Patient stated, "right now I feel like I can't take anymore." Since Oct. Last year has seen multiple doctors. Report suicidal ideations varies day by day depending on her mood and level of pain she's experiencing. Report suicidal ideations always triggered by if she's able to get some sleep the night before. Report lack of energy and feeling week. Denied homicidal ideations, denied auditory/visual hallucinations.  How Long Has This Been Causing You Problems? <Week  Have You Recently Had Any Thoughts About Hurting Yourself? Yes  How long ago did you have thoughts about hurting yourself? Report last suicidal ideations this morning. Report it was a morning she woke up and just did not feel like her self. Report she didn't have the energy to do anything. Report she did not rest well last night.  Are You Planning to Commit Suicide/Harm Yourself At This time? No  Have you Recently Had Thoughts About [place]? No  Are You Planning To Harm Someone At This Time? No  Are you currently experiencing any auditory, visual or other hallucinations? No  Have You Used Any Alcohol or Drugs in the Past 24 Hours? No  Do you have any current medical co-morbidities that require immediate attention? No  Clinician description of patient physical appearance/behavior: chronic back pain, anxiety, restlessness, tired, inability to sleep  What Do You Feel Would Help You the Most Today? Stress Management  If access to [facility]  Urgent Care was not available, would you have sought care in the Emergency Department? Yes  Determination of Need Routine (7 days)  Options For Referral Medication Management

## 2021-06-24 NOTE — ED Provider Notes (Signed)
Behavioral Health Urgent Care Medical Screening Exam  Patient Name: Laura [name] MRN: [id number] Date of Evaluation: 06/24/21 Chief Complaint:   Diagnosis:  Final diagnoses:  Severe episode of recurrent major depressive disorder, without psychotic features ([place])    History of Present illness: Laura [name] is a 75 y.o. female patient presented to [facility] as a walk in  accompanied by her spouse with complaints of "My back doctor sent me in to be see, I told her I have suicidal thoughts some times".  Laura [name], 75 y.o., female patient seen face to face by this provider, consulted with Dr. Lovette Cliche; and chart reviewed on 06/24/21.  On evaluation Laura [name] reports last October she started having back pain and had an ablation and it was not affective. Also, around that time she was diagnosed with lung cancer and had a left lobectomy two weeks ago.She will follow up with oncologist in January 2023. Reports pain on her left side where procedure was completed.. She followed up with physician on Monday and reports she is healing fine. Reports she use to see a psychiatrist two years ago. No currently psychiatric services in place and she does not take any psychotropic medications. Per chart review patient has a history of MDD, L Bundle branch block, ischemic cardiomyopathy, and HTN. She has tried Prozac and Sertraline in the past. States they made her mind "crazy".   During today's evaluation Laura [name] is in sitting position in no acute distress. She is alert/oriented x 4; and cooperative. She makes good eye contact. Speech is moderate volume, pace and tone. She endorses depression and anxiety with a congruent affect. She endorses feelings of helplessness, hopelessness, fatigue, loss of interest, and decreased concentration. Reports she only sleeps 3 hours per night and has a decreased appetite. Reports she has not spoken to one of her daughters and granddaughter in four years and this  "breaks my heart". Her thought process is coherent and relevant; There is no indication that she is currently responding to internal/external stimuli or experiencing delusional thought content.She denies paranoia/AVH/HI. She endorses suicidal ideations. Reports they are situational, if something goes wrong, she is having a bad day,or her pain is increased, then she is more likely to have suicidal thoughts. Her protective factors are her spouse, Kyra Searles, and family. In the past she has thought of a plan for suicide. States, "I have stairs I could jump from, a gun and pills". Completed safety planning and suicide intervention/prevention with patient and spouse.Patient contracts for safety. Spouse agreed to remove any item that could be identified as a safety concern from the home and away from patient access. This includes knives, pills and firearm. Spouse is retired and will monitor patient 24/7. He has no immediate safety concerns with patient returning home.  Patient agreed that if she were to have SI to inform spouse, call 911 or return to [facility].    Patient declined voluntary inpatient psychiatric admission. Discussed Remeron 7.5 mg for depression. Patient agreed. Educated on risk of falls. Discussed out patient providers and out patient therapy including PHP program. Patient is willing to participate. Resources were provided.   At this time Laura [name] is educated and verbalizes understanding of mental health resources and other crisis services in the community. She is instructed to call 911 and present to the nearest emergency room should she experience any suicidal/homicidal ideation, auditory/visual/hallucinations, or detrimental worsening of her mental health condition.  She was a also advised by  Probation officer that she could call the toll-free phone on insurance card to assist with identifying in network counselors and agencies or number on back of insurance card.     Psychiatric Specialty  Exam  Presentation  General Appearance:Appropriate for Environment; Casual  Eye Contact:Good  Speech:Clear and Coherent; Normal Rate  Speech Volume:Normal  Handedness:Right   Mood and Affect  Mood:Depressed; Anxious  Affect:Tearful; Congruent; Depressed   Thought Process  Thought Processes:Coherent  Descriptions of Associations:Intact  Orientation:Full (Time, Place and Person)  Thought Content:Logical    Hallucinations:None  Ideas of Reference:None  Suicidal Thoughts:Yes, Passive Without Intent; With Plan; Without Means to Carry Out  Homicidal Thoughts:No   Sensorium  Memory:Immediate Good; Recent Good; Remote Good  Judgment:Good  Insight:Good   Executive Functions  Concentration:Good  Attention Span:Good  [place]  Language:Good   Psychomotor Activity  Psychomotor Activity:Normal   Assets  Assets:Communication Skills; Desire for Improvement; Financial Resources/Insurance; Housing; Intimacy; Leisure Time; Physical Health; Resilience; Transportation; Social Support   Sleep  Sleep:Poor  Number of hours: 3   No data recorded  Physical Exam: Physical Exam Vitals and nursing note reviewed.  Constitutional:      General: She is not in acute distress.    Appearance: Normal appearance. She is well-developed. She is not ill-appearing.  HENT:     Head: Normocephalic and atraumatic.  Eyes:     General:        Right eye: No discharge.        Left eye: No discharge.     Conjunctiva/sclera: Conjunctivae normal.  Cardiovascular:     Rate and Rhythm: Normal rate and regular rhythm.     Heart sounds: No murmur heard. Pulmonary:     Effort: Pulmonary effort is normal. No respiratory distress.     Breath sounds: Normal breath sounds.  Abdominal:     Tenderness: There is abdominal tenderness.    Musculoskeletal:        General: No swelling. Normal range of motion.     Cervical back: Normal range of motion and  neck supple.  Skin:    Coloration: Skin is not jaundiced or pale.  Neurological:     Mental Status: She is alert and oriented to person, place, and time.  Psychiatric:        Attention and Perception: Attention and perception normal.        Mood and Affect: Affect normal. Mood is anxious and depressed.        Speech: Speech normal.        Behavior: Behavior normal. Behavior is cooperative.        Thought Content: Thought content includes suicidal ideation. Thought content does not include suicidal plan.        Cognition and Memory: Cognition normal.        Judgment: Judgment normal.   Review of Systems  Constitutional: Negative.   HENT: Negative.    Eyes: Negative.   Respiratory: Negative.  Negative for cough and shortness of breath.   Cardiovascular: Negative.   Musculoskeletal: Negative.   Skin: Negative.   Neurological: Negative.   Psychiatric/Behavioral:  Positive for depression and suicidal ideas. The patient is nervous/anxious.   Blood pressure 138/71, pulse 61, resp. rate 18, SpO2 99 %. There is no height or weight on file to calculate BMI.  Musculoskeletal: Strength & Muscle Tone: within normal limits Gait & Station: normal Patient leans: N/A   [place] MSE Discharge Disposition for Follow up and Recommendations: Based on my  evaluation the patient does not appear to have an emergency medical condition and can be discharged with resources and follow up care in outpatient services for Medication Management, Partial Hospitalization Program, Individual Therapy, and Group Therapy  Prescription for Remeron 7.5 mg PO QD for depression/sleep/anixety 30 day supply provided.   Provided resources for [place] program, [facility], [place], and [facility] (senior therapy for in home).   Safety planning with spouse and patient completed.   No evidence of imminent risk to self or others at present.    Patient does not meet criteria for  psychiatric inpatient admission. Discussed crisis plan, support from social network, calling 911, coming to the Emergency Department, and calling Suicide Hotline.    Revonda Humphrey, NP 06/24/2021, 12:52 PM

## 2021-07-01 ENCOUNTER — Telehealth: Payer: Self-pay | Admitting: Internal Medicine

## 2021-07-01 NOTE — Telephone Encounter (Signed)
Scheduled appt per 12/20 referral. Pt is aware of appt date and time. I had called pt on 12/28 to sch appt, however when I spoke to pt she had requested a call back after the new year. I spoke to her this morning and was able to get her scheduled for an appt. She is aware to arrive 15 mins prior to appt time as well.

## 2021-07-02 ENCOUNTER — Other Ambulatory Visit: Payer: Self-pay | Admitting: Thoracic Surgery (Cardiothoracic Vascular Surgery)

## 2021-07-02 DIAGNOSIS — Z902 Acquired absence of lung [part of]: Secondary | ICD-10-CM

## 2021-07-04 ENCOUNTER — Telehealth (HOSPITAL_COMMUNITY): Payer: Self-pay | Admitting: Family Medicine

## 2021-07-04 NOTE — BH Assessment (Signed)
Care Management - [facility] Follow Up Discharges  ° °Writer attempted to make contact with patient today and was unsuccessful.  Writer left a HIPPA compliant voice message.  ° °Per chart review, patient was provided with outpatient resources. ° °

## 2021-07-08 ENCOUNTER — Encounter: Payer: Self-pay | Admitting: Thoracic Surgery (Cardiothoracic Vascular Surgery)

## 2021-07-08 ENCOUNTER — Encounter: Payer: Medicare Other | Admitting: Thoracic Surgery (Cardiothoracic Vascular Surgery)

## 2021-07-09 ENCOUNTER — Ambulatory Visit
Admission: RE | Admit: 2021-07-09 | Discharge: 2021-07-09 | Disposition: A | Discharge status: Home / Self Care | Payer: Medicare Other | Source: Ambulatory Visit | Attending: Thoracic Surgery (Cardiothoracic Vascular Surgery) | Admitting: Thoracic Surgery (Cardiothoracic Vascular Surgery)

## 2021-07-09 ENCOUNTER — Other Ambulatory Visit: Payer: Self-pay

## 2021-07-09 ENCOUNTER — Ambulatory Visit (INDEPENDENT_AMBULATORY_CARE_PROVIDER_SITE_OTHER): Payer: Self-pay | Admitting: Thoracic Surgery (Cardiothoracic Vascular Surgery)

## 2021-07-09 VITALS — BP 110/62 | HR 56 | Resp 20 | Ht 63.0 in | Wt 163.0 lb

## 2021-07-09 DIAGNOSIS — Z902 Acquired absence of lung [part of]: Secondary | ICD-10-CM

## 2021-07-09 DIAGNOSIS — R079 Chest pain, unspecified: Secondary | ICD-10-CM | POA: Diagnosis not present

## 2021-07-09 DIAGNOSIS — C3432 Malignant neoplasm of lower lobe, left bronchus or lung: Secondary | ICD-10-CM

## 2021-07-09 NOTE — Progress Notes (Signed)
[address]       [place] [address]             [phone]     HPI: Mrs. Ghattas returns for a scheduled follow-up visit  Alika Eppes is a 76 year old woman with a history of Takotsubo's, MI, left bundle branch block, ischemic cardiomyopathy, hypertension, hyperlipidemia, hypoglycemia, hypothyroidism, obesity, anxiety, depression, asthma, and memory loss.  She is a lifelong non-smoker.  She was incidentally found to have a lung nodule in 2021.  Over time it increased in size.  I did a robotic left lower lobectomy for a T1, N0, stage Ia mucinous adenocarcinoma on 05/30/2021.  I last saw her on 06/17/2021.  That was about 3 weeks postop.  At that time she was having severe pain issues and itching issues with her incisions.  We had just started her on gabapentin 100 mg 3 times daily.  Over time she has increased that to 300 mg 3 times a day.    Her pain has improved dramatically.  She says she took half of a hydrocodone yesterday but that was the first time in over 2 weeks.  Her biggest complaint is itching at the incisions.  She has a cream that she got at the pharmacy that is helping with that.  Her other complaints are GI related.  She complains of nausea, poor appetite, early satiety, and constipation.  She had these issues prior to surgery but they have been much worse since her operation.  Past Medical History:  Diagnosis Date   Anemia    Anxiety    Asthma    related to sesonal allergies   Chronic combined systolic and diastolic CHF, NYHA class 2 ([place]) CARDIOLOGIST-  DR OEUMPNTI   Coronary artery disease    Depression    History of kidney stones    History of non-ST elevation myocardial infarction (NSTEMI) 11/27/2009   SECONDARY TO TAKOTSUDO SYNDROME (CARDIAC CATH NORMAL)   Hyperlipemia    Hypertension    Hypoglycemia    Hypothyroidism    LBBB (left bundle branch block)    Left ventricular ejection fraction less than 40%    38% PER CARDIOLOGIST NOTE  (DR CROITORU)   Memory loss    Mood swings    Myocardial infarction ([place]) 2011   Nonischemic dilated cardiomyopathy ([place])    MODERATELY DEPRESSED LVF;EF 35-45% by Echo 05/27/11   OSA (obstructive sleep apnea) MODERATE PER STUDY 2005   CPAP NONCOMPLIANT   Pre-diabetes    Seasonal allergies    SUI (stress urinary incontinence, female)     Current Outpatient Medications  Medication Sig Dispense Refill   atorvastatin (LIPITOR) 80 MG tablet TAKE 1/2 TABLET BY MOUTH DAILY FOR CHOLESTEROL 45 tablet 3   diazepam (VALIUM) 5 MG tablet Take 1 tablet (5 mg total) by mouth every 12 (twelve) hours as needed for muscle spasms. (Patient taking differently: Take 10 mg by mouth every 12 (twelve) hours as needed for muscle spasms.) 10 tablet 0   gabapentin (NEURONTIN) 300 MG capsule Take 1 capsule (300 mg total) by mouth 3 (three) times daily. 90 capsule 5   hydrALAZINE (APRESOLINE) 25 MG tablet Take 25 mg by mouth 3 (three) times daily.     HYDROcodone-acetaminophen (NORCO/VICODIN) 5-325 MG tablet Take 1-2 tablets by mouth every 6 (six) hours as needed for moderate pain. 30 tablet 0   irbesartan (AVAPRO) 300 MG tablet Take 300 mg by mouth daily.     levothyroxine (SYNTHROID,  LEVOTHROID) 88 MCG tablet Take 88 mcg by mouth daily before breakfast.     linaclotide (LINZESS) 145 MCG CAPS capsule Take 145 mcg by mouth daily as needed (constipation).     Melatonin 3 MG CAPS Take 6 mg by mouth at bedtime. At bedtime     metoprolol succinate (TOPROL-XL) 50 MG 24 hr tablet TAKE ONE AND ONE-HALF TABLETS BY MOUTH DAILY 90 tablet 3   NUVIGIL 250 MG tablet Take 125 mg by mouth daily as needed (energy boost).  3   ondansetron (ZOFRAN) 8 MG tablet Take 8 mg by mouth every 8 (eight) hours as needed for nausea or vomiting.     No current facility-administered medications for this visit.    Physical Exam BP 110/62    Pulse (!) 56    Resp 20    Ht 5\' 3"  (1.6 m)    Wt 163 lb (73.9 kg)    SpO2 95% Comment: RA   BMI 28.86  kg/m  76 year old woman in no acute distress Alert and oriented x3 with no focal deficits Lungs diminished at left base but otherwise clear Cardiac regular rate and rhythm Incisions clean dry and intact  Diagnostic Tests: I personally reviewed her chest x-ray images.  There are postoperative changes.  No active disease.  Impression: Laura [name] is a 76 year old woman with a history of Takotsubo's, MI, left bundle branch block, ischemic cardiomyopathy, hypertension, hyperlipidemia, hypoglycemia, hypothyroidism, obesity, anxiety, depression, asthma, and memory loss.  She is a lifelong non-smoker.   She had a robotic left lower lobectomy on 05/30/2021.  She had a stage Ia adenocarcinoma.  She has an appointment to see Dr. Julien Nordmann tomorrow.  She should not need any adjuvant therapy.  Postoperative intercostal neuralgia-her postoperative pain has improved dramatically.  She is not taking narcotics to any significant degree.  She does still complain of itching which is her primary complaint.  Fortunately she has found a cream that is helping with that.  Gastrointestinal complaints including nausea, poor appetite, early satiety, and constipation.  These been aggravated and worsened since her surgery.  She has seen Dr. Earlean Shawl in the past.  She will follow-up with him if those symptoms do not improve in the near future.  She does have a prescription for Zofran for nausea although she says that does not always help.  Plan: Continue gabapentin.  Continue topical anti-itching medication. Follow-up with Dr. Earlean Shawl regarding abdominal complaints She has an appointment with Dr. Julien Nordmann tomorrow.  He will do her long-term follow-up.  I will plan to see her back in about 2 months to check on her progress.  We will do a PA and lateral chest x-ray at that time.  Melrose Nakayama, MD [facility] [phone]

## 2021-07-10 ENCOUNTER — Inpatient Hospital Stay: Payer: Medicare Other

## 2021-07-10 ENCOUNTER — Inpatient Hospital Stay: Payer: Medicare Other | Attending: Internal Medicine | Admitting: Internal Medicine

## 2021-07-10 ENCOUNTER — Encounter: Payer: Self-pay | Admitting: Internal Medicine

## 2021-07-10 ENCOUNTER — Encounter: Payer: Medicare Other | Admitting: Thoracic Surgery (Cardiothoracic Vascular Surgery)

## 2021-07-10 VITALS — BP 130/72 | HR 65 | Temp 97.3°F | Resp 20 | Ht 63.0 in | Wt 165.4 lb

## 2021-07-10 DIAGNOSIS — C3490 Malignant neoplasm of unspecified part of unspecified bronchus or lung: Secondary | ICD-10-CM

## 2021-07-10 DIAGNOSIS — C3432 Malignant neoplasm of lower lobe, left bronchus or lung: Secondary | ICD-10-CM | POA: Diagnosis not present

## 2021-07-10 DIAGNOSIS — Z90710 Acquired absence of both cervix and uterus: Secondary | ICD-10-CM

## 2021-07-10 DIAGNOSIS — I110 Hypertensive heart disease with heart failure: Secondary | ICD-10-CM

## 2021-07-10 DIAGNOSIS — I5042 Chronic combined systolic (congestive) and diastolic (congestive) heart failure: Secondary | ICD-10-CM | POA: Diagnosis not present

## 2021-07-10 DIAGNOSIS — Z902 Acquired absence of lung [part of]: Secondary | ICD-10-CM

## 2021-07-10 LAB — CMP (CANCER CENTER ONLY)
ALT: 12 U/L (ref 0–44)
AST: 14 U/L — ABNORMAL LOW (ref 15–41)
Albumin: 3.8 g/dL (ref 3.5–5.0)
Alkaline Phosphatase: 73 U/L (ref 38–126)
Anion gap: 3 — ABNORMAL LOW (ref 5–15)
BUN: 15 mg/dL (ref 8–23)
CO2: 33 mmol/L — ABNORMAL HIGH (ref 22–32)
Calcium: 9.4 mg/dL (ref 8.9–10.3)
Chloride: 105 mmol/L (ref 98–111)
Creatinine: 0.91 mg/dL (ref 0.44–1.00)
GFR, Estimated: >60 mL/min (ref >60)
Glucose, Bld: 95 mg/dL (ref 70–99)
Potassium: 5.2 mmol/L — ABNORMAL HIGH (ref 3.5–5.1)
Sodium: 141 mmol/L (ref 135–145)
Total Bilirubin: 0.3 mg/dL (ref 0.3–1.2)
Total Protein: 6.7 g/dL (ref 6.5–8.1)

## 2021-07-10 LAB — CBC WITH DIFFERENTIAL (CANCER CENTER ONLY)
Abs Immature Granulocytes: 0.01 10*3/uL (ref 0.00–0.07)
Basophils Absolute: 0.1 10*3/uL (ref 0.0–0.1)
Basophils Relative: 1 %
Eosinophils Absolute: 0.2 10*3/uL (ref 0.0–0.5)
Eosinophils Relative: 4 %
HCT: 38.5 % (ref 36.0–46.0)
Hemoglobin: 11.9 g/dL — ABNORMAL LOW (ref 12.0–15.0)
Immature Granulocytes: 0 %
Lymphocytes Relative: 29 %
Lymphs Abs: 2 10*3/uL (ref 0.7–4.0)
MCH: 24.9 pg — ABNORMAL LOW (ref 26.0–34.0)
MCHC: 30.9 g/dL (ref 30.0–36.0)
MCV: 80.5 fL (ref 80.0–100.0)
Monocytes Absolute: 0.7 10*3/uL (ref 0.1–1.0)
Monocytes Relative: 11 %
Neutro Abs: 3.8 10*3/uL (ref 1.7–7.7)
Neutrophils Relative %: 55 %
Platelet Count: 279 10*3/uL (ref 150–400)
RBC: 4.78 MIL/uL (ref 3.87–5.11)
RDW: 13.9 % (ref 11.5–15.5)
WBC Count: 6.8 10*3/uL (ref 4.0–10.5)
nRBC: 0 % (ref 0.0–0.2)

## 2021-07-10 NOTE — Progress Notes (Signed)
[facility] Telephone:([phone]) [phone]   Fax:(336) [phone]  CONSULT NOTE  REFERRING PHYSICIAN: Dr. Modesto Charon  REASON FOR CONSULTATION:  76 years old white female recently diagnosed with lung cancer.  HPI Laura [name] is a 76 y.o. female with past medical history significant for hypertension, dyslipidemia, hypothyroidism, left bundle branch block, congestive heart failure, coronary artery disease, anemia, asthma, and prediabetes.  The patient was complaining of abdominal pain in September 2022 and she had CT of the abdomen and pelvis on 03/25/2021 at [facility].  It showed descending and sigmoid diverticulosis without evidence of acute diverticulitis but incidentally it showed significant interval enlargement of a spiculated nodule of the dependent left lower lobe measuring 1.6 x 1.1 cm concerning for primary lung malignancy.  The patient had a PET scan performed on April 08, 2021 at [facility] and it showed the left lower lobe pulmonary nodule had increased FDG activity and worrisome for primary lung cancer.  There was focal hypermetabolic thickening of the right inferior pleura/diaphragm of uncertain etiology but neoplasm was also a possibility.  The patient was referred to Dr. Roxan Hockey.  She underwent robotic assisted thoracoscopy with left lower lobectomy and lymph node dissection by Dr. Roxan Hockey on May 30, 2021.  The final pathology ([id number]) showed invasive mucinous adenocarcinoma with lipidic component with clear margins of resection.  The tumor measured 2.2 x 2.0 x 1.1 cm.  There was no evidence for visceral pleural or lymphovascular invasion.  The dissected lymph nodes were negative for malignancy. Dr. Roxan Hockey kindly referred the patient to me today for evaluation and close monitoring of her condition. When seen today the patient continued to have left-sided soreness and itching.  She continues to have shortness of breath and mild cough  but no hemoptysis.  She has intermittent nausea no vomiting, diarrhea or constipation.  She also has occasional headache with visual changes secondary to cataract surgery. Family history significant for mother with pneumonia and hypertension.  Father had heart disease and brother had pulmonary embolism.  The patient is married and has 2 children she used to work as a Freight forwarder for [employer].  She has no history for smoking, alcohol or drug abuse. HPI  Past Medical History:  Diagnosis Date   Anemia    Anxiety    Asthma    related to sesonal allergies   Chronic combined systolic and diastolic CHF, NYHA class 2 ([place]) CARDIOLOGIST-  DR [id number]   Coronary artery disease    Depression    History of kidney stones    History of non-ST elevation myocardial infarction (NSTEMI) 11/27/2009   SECONDARY TO TAKOTSUDO SYNDROME (CARDIAC CATH NORMAL)   Hyperlipemia    Hypertension    Hypoglycemia    Hypothyroidism    LBBB (left bundle branch block)    Left ventricular ejection fraction less than 40%    38% PER CARDIOLOGIST NOTE (DR CROITORU)   Memory loss    Mood swings    Myocardial infarction ([place]) 2011   Nonischemic dilated cardiomyopathy ([place])    MODERATELY DEPRESSED LVF;EF 35-45% by Echo 05/27/11   OSA (obstructive sleep apnea) MODERATE PER STUDY 2005   CPAP NONCOMPLIANT   Pre-diabetes    Seasonal allergies    SUI (stress urinary incontinence, female)     Past Surgical History:  Procedure Laterality Date   ABDOMINAL HYSTERECTOMY  06/30/1983   partial   CARDIAC CATHETERIZATION  09-04-1999;  08/25/2004;   12/09/2009  DR CROITORU   NORMAL CORONARIES/  APICAL BALLOONING OF LV CONSISTENT WITH TAKOTSUBO SYMPTOMS/ EF 30-35%   CATARACT EXTRACTION W/ INTRAOCULAR LENS  IMPLANT, BILATERAL     CHOLECYSTECTOMY N/A 09/29/2012   Procedure: LAPAROSCOPIC CHOLECYSTECTOMY WITH INTRAOPERATIVE CHOLANGIOGRAM;  Surgeon: Adin Hector, MD;  Location: [facility];  Service: General;  Laterality: N/A;   COLONOSCOPY   07/2020   CYSTOSCOPY N/A 09/19/2012   Procedure: Erlene Quan;  Surgeon: Bernestine Amass, MD;  Location: [facility];  Service: Urology;  Laterality: N/A;   DILATION AND CURETTAGE OF UTERUS     EYE SURGERY Bilateral    cataract removal   INTERCOSTAL NERVE BLOCK  05/30/2021   Procedure: INTERCOSTAL NERVE BLOCK;  Surgeon: Melrose Nakayama, MD;  Location: [facility];  Service: Thoracic;;   KNEE ARTHROSCOPY W/ MENISCECTOMY  07/27/2011   MEDIAL AND LATERAL   LOBECTOMY  05/30/2021   Procedure: LEFT LOWER LOBECTOMY;  Surgeon: Melrose Nakayama, MD;  Location: [facility];  Service: Thoracic;;   LYMPH NODE DISSECTION  05/30/2021   Procedure: LYMPH NODE DISSECTION;  Surgeon: Melrose Nakayama, MD;  Location: [facility];  Service: Thoracic;;   NASAL SEPTUM SURGERY  02/28/1979   PUBOVAGINAL SLING N/A 09/19/2012   Procedure: SUBURETHRAL Janyth Pupa;  Surgeon: Bernestine Amass, MD;  Location: [facility];  Service: Urology;  Laterality: N/A;   RIGHT URETEROSCOPIC STONE EXTRACTION  08/31/2000   TRANSTHORACIC ECHOCARDIOGRAM  05-27-2011  DR CROITORU   MODERATELY DEPRESSED LVF DUE TO GLOBAL HYPOKINESIS AND MARKED SYSTOLIC ASYNCHRONY/ EF 09%/ MILD LEFT ATRIAL DILATATION    Family History  Problem Relation Age of Onset   Pneumonia Mother    Hypertension Mother    Heart attack Father    Fibromyalgia Brother    Pulmonary embolism Brother    Hypertension Brother    Heart disease Brother    Heart attack Paternal Grandfather     Social History Social History   Tobacco Use   Smoking status: Never   Smokeless tobacco: Never  Vaping Use   Vaping Use: Never used  Substance Use Topics   Alcohol use: No    Alcohol/week: 0.0 standard drinks   Drug use: No    Allergies  Allergen Reactions   Codeine Anaphylaxis, Hives and Other (See Comments)    Headache. Daughter reported that it caused her throat to swell up    Ciprofloxacin Nausea And Vomiting   Ace Inhibitors Other  (See Comments)    Unknown- it "didn't agree with her"    Citalopram Other (See Comments)    Fatigue   Fetzima [Levomilnacipran] Other (See Comments)    "Talking out of my head"   Lasix [Furosemide] Other (See Comments)    HEADACHE   Nsaids Other (See Comments)    Told not to take NSAIDs because of her heart    Sulfamethoxazole-Trimethoprim Other (See Comments)    NERVOUS AND DISORENTED   Xanax Xr [Alprazolam Er] Other (See Comments)    confusion    Current Outpatient Medications  Medication Sig Dispense Refill   atorvastatin (LIPITOR) 80 MG tablet TAKE 1/2 TABLET BY MOUTH DAILY FOR CHOLESTEROL 45 tablet 3   diazepam (VALIUM) 5 MG tablet Take 1 tablet (5 mg total) by mouth every 12 (twelve) hours as needed for muscle spasms. (Patient taking differently: Take 10 mg by mouth every 12 (twelve) hours as needed for muscle spasms.) 10 tablet 0   gabapentin (NEURONTIN) 300 MG capsule Take 1 capsule (300 mg total) by mouth 3 (three) times daily.  90 capsule 5   hydrALAZINE (APRESOLINE) 25 MG tablet Take 25 mg by mouth 3 (three) times daily.     HYDROcodone-acetaminophen (NORCO/VICODIN) 5-325 MG tablet Take 1-2 tablets by mouth every 6 (six) hours as needed for moderate pain. 30 tablet 0   irbesartan (AVAPRO) 300 MG tablet Take 300 mg by mouth daily.     levothyroxine (SYNTHROID, LEVOTHROID) 88 MCG tablet Take 88 mcg by mouth daily before breakfast.     linaclotide (LINZESS) 145 MCG CAPS capsule Take 145 mcg by mouth daily as needed (constipation).     Melatonin 3 MG CAPS Take 6 mg by mouth at bedtime. At bedtime     metoprolol succinate (TOPROL-XL) 50 MG 24 hr tablet TAKE ONE AND ONE-HALF TABLETS BY MOUTH DAILY 90 tablet 3   NUVIGIL 250 MG tablet Take 125 mg by mouth daily as needed (energy boost).  3   ondansetron (ZOFRAN) 8 MG tablet Take 8 mg by mouth every 8 (eight) hours as needed for nausea or vomiting.     No current facility-administered medications for this visit.    Review of  Systems  Constitutional: positive for fatigue Eyes: negative Ears, nose, mouth, throat, and face: negative Respiratory: positive for cough, dyspnea on exertion, and pleurisy/chest pain Cardiovascular: negative Gastrointestinal: positive for nausea Genitourinary:negative Integument/breast: negative Hematologic/lymphatic: negative Musculoskeletal:negative Neurological: positive for headaches Behavioral/Psych: negative Endocrine: negative Allergic/Immunologic: negative  Physical Exam  WUJ:WJXBJ, healthy, no distress, well nourished, and well developed SKIN: skin color, texture, turgor are normal, no rashes or significant lesions HEAD: Normocephalic, No masses, lesions, tenderness or abnormalities EYES: normal, PERRLA, Conjunctiva are pink and non-injected EARS: External ears normal, Canals clear OROPHARYNX:no exudate, no erythema, and lips, buccal mucosa, and tongue normal  NECK: supple, no adenopathy, no JVD LYMPH:  no palpable lymphadenopathy, no hepatosplenomegaly BREAST:not examined LUNGS: clear to auscultation , and palpation HEART: regular rate & rhythm, no murmurs, and no gallops ABDOMEN:abdomen soft, non-tender, and normal bowel sounds BACK: Back symmetric, no curvature., No CVA tenderness EXTREMITIES:no joint deformities, effusion, or inflammation, no edema  NEURO: alert & oriented x 3 with fluent speech, no focal motor/sensory deficits  PERFORMANCE STATUS: ECOG 1  LABORATORY DATA: Lab Results  Component Value Date   WBC 14.8 (H) 06/01/2021   HGB 11.0 (L) 06/01/2021   HCT 35.9 (L) 06/01/2021   MCV 83.5 06/01/2021   PLT 219 06/01/2021      Chemistry      Component Value Date/Time   NA 135 06/01/2021 0146   NA 144 08/30/2020 1550   K 3.9 06/01/2021 0146   CL 104 06/01/2021 0146   CO2 28 06/01/2021 0146   BUN 9 06/01/2021 0146   BUN 10 08/30/2020 1550   CREATININE 0.72 06/01/2021 0146   CREATININE 0.75 05/15/2015 1306      Component Value Date/Time    CALCIUM 8.2 (L) 06/01/2021 0146   ALKPHOS 58 06/01/2021 0146   AST 19 06/01/2021 0146   ALT 12 06/01/2021 0146   BILITOT 0.1 (L) 06/01/2021 0146   BILITOT 0.4 08/30/2020 1550       RADIOGRAPHIC STUDIES: DG Chest 2 View  Result Date: 07/09/2021 CLINICAL DATA:  Chest pain, history of lymph node dissection 05/30/2021 EXAM: CHEST - 2 VIEW COMPARISON:  Chest radiograph 06/17/2021, CT chest 05/12/2020 FINDINGS: The cardiomediastinal silhouette is stable. A small left pleural effusion with adjacent atelectasis is similar to the prior study. There is no new or worsening focal airspace disease. There is no right effusion. There is  no pneumothorax. The bones are stable. IMPRESSION: Unchanged small left pleural effusion with adjacent atelectasis. No new or worsening focal airspace disease. Electronically Signed   By: Valetta Mole M.D.   On: 07/09/2021 14:03   DG Chest 2 View  Result Date: 06/17/2021 CLINICAL DATA:  Post lobectomy EXAM: CHEST - 2 VIEW COMPARISON:  06/02/2021 FINDINGS: No pneumothorax. Small left pleural effusion with left basilar atelectasis. Similar cardiomediastinal contours. Left chest wall emphysema is no longer seen. IMPRESSION: No pneumothorax. Small left pleural effusion with left basilar atelectasis. Electronically Signed   By: Macy Mis M.D.   On: 06/17/2021 08:42    ASSESSMENT: This is a very pleasant 76 years old white female recently diagnosed with a stage Ia (T1c, N0, M0) non-small cell lung cancer, adenocarcinoma status post left lower lobectomy with lymph node dissection under the care of Dr. Roxan Hockey on May 30, 2021.   PLAN: I had a lengthy discussion with the patient today about her current disease stage, prognosis and treatment options. I explained to the patient that the 5-year survival for patient with a stage IA is around 80% and there is no survival benefit for adjuvant systemic chemotherapy or radiation. I recommended for the patient to continue on  observation with repeat CT scan of the chest in 6 months. For the history of sinus infection as well as diverticulosis, she will continue to follow-up with her primary care physician and gastroenterology. The patient was advised to call immediately if she has any other concerning symptoms in the interval. The patient voices understanding of current disease status and treatment options and is in agreement with the current care plan.  All questions were answered. The patient knows to call the clinic with any problems, questions or concerns. We can certainly see the patient much sooner if necessary.  Thank you so much for allowing me to participate in the care of Laura [name]. I will continue to follow up the patient with you and assist in her care.  The total time spent in the appointment was 60 minutes.  Disclaimer: This note was dictated with voice recognition software. Similar sounding words can inadvertently be transcribed and may not be corrected upon review.   Eilleen Kempf July 10, 2021, 10:41 AM

## 2021-07-17 DIAGNOSIS — R109 Unspecified abdominal pain: Secondary | ICD-10-CM | POA: Diagnosis not present

## 2021-07-17 DIAGNOSIS — K5904 Chronic idiopathic constipation: Secondary | ICD-10-CM | POA: Diagnosis not present

## 2021-08-01 DIAGNOSIS — K5909 Other constipation: Secondary | ICD-10-CM | POA: Diagnosis not present

## 2021-08-01 DIAGNOSIS — R110 Nausea: Secondary | ICD-10-CM | POA: Diagnosis not present

## 2021-08-10 DIAGNOSIS — Z1152 Encounter for screening for COVID-19: Secondary | ICD-10-CM | POA: Diagnosis not present

## 2021-08-12 ENCOUNTER — Telehealth: Payer: Self-pay | Admitting: Gastroenterology

## 2021-08-12 NOTE — Telephone Encounter (Signed)
Request received to transfer GI care from outside practice to [facility].  We appreciate the interest in our practice, however at this time due to high demand from patients without established GI providers we cannot accommodate this transfer.  Ability to accommodate future transfer requests may change over time and the patient can contact us again in 6-12 months if still interested in being seen at [facility].      °

## 2021-08-12 NOTE — Telephone Encounter (Signed)
Good Afternoon Dr. Fuller Plan,    Received a call from patient wanting to do a transfer of care from Dr. Earlean Shawl to you for Diverticulitis and abdominal pain.  Records are in epic, will you please review and advise on scheduling.  Thank you.

## 2021-08-22 ENCOUNTER — Other Ambulatory Visit: Payer: Self-pay

## 2021-08-22 ENCOUNTER — Other Ambulatory Visit: Payer: Self-pay | Admitting: Family Medicine

## 2021-08-22 ENCOUNTER — Ambulatory Visit
Admission: RE | Admit: 2021-08-22 | Discharge: 2021-08-22 | Disposition: A | Discharge status: Home / Self Care | Payer: Medicare Other | Source: Ambulatory Visit | Attending: Family Medicine | Admitting: Family Medicine

## 2021-08-22 DIAGNOSIS — Z1231 Encounter for screening mammogram for malignant neoplasm of breast: Secondary | ICD-10-CM | POA: Diagnosis not present

## 2021-09-02 DIAGNOSIS — K5900 Constipation, unspecified: Secondary | ICD-10-CM | POA: Diagnosis not present

## 2021-09-02 DIAGNOSIS — K219 Gastro-esophageal reflux disease without esophagitis: Secondary | ICD-10-CM | POA: Diagnosis not present

## 2021-09-02 DIAGNOSIS — R110 Nausea: Secondary | ICD-10-CM | POA: Diagnosis not present

## 2021-09-02 DIAGNOSIS — K5904 Chronic idiopathic constipation: Secondary | ICD-10-CM | POA: Diagnosis not present

## 2021-09-02 DIAGNOSIS — Z9049 Acquired absence of other specified parts of digestive tract: Secondary | ICD-10-CM | POA: Diagnosis not present

## 2021-09-02 DIAGNOSIS — K589 Irritable bowel syndrome without diarrhea: Secondary | ICD-10-CM | POA: Diagnosis not present

## 2021-09-08 ENCOUNTER — Other Ambulatory Visit: Payer: Self-pay | Admitting: Thoracic Surgery (Cardiothoracic Vascular Surgery)

## 2021-09-08 DIAGNOSIS — C3432 Malignant neoplasm of lower lobe, left bronchus or lung: Secondary | ICD-10-CM

## 2021-09-09 ENCOUNTER — Ambulatory Visit (INDEPENDENT_AMBULATORY_CARE_PROVIDER_SITE_OTHER): Payer: Medicare Other | Admitting: Thoracic Surgery (Cardiothoracic Vascular Surgery)

## 2021-09-09 ENCOUNTER — Other Ambulatory Visit: Payer: Self-pay

## 2021-09-09 ENCOUNTER — Ambulatory Visit
Admission: RE | Admit: 2021-09-09 | Discharge: 2021-09-09 | Disposition: A | Discharge status: Home / Self Care | Payer: Medicare Other | Source: Ambulatory Visit | Attending: Thoracic Surgery (Cardiothoracic Vascular Surgery) | Admitting: Thoracic Surgery (Cardiothoracic Vascular Surgery)

## 2021-09-09 VITALS — BP 170/80 | HR 94 | Resp 20 | Ht 63.0 in | Wt 165.0 lb

## 2021-09-09 DIAGNOSIS — Z902 Acquired absence of lung [part of]: Secondary | ICD-10-CM | POA: Diagnosis not present

## 2021-09-09 DIAGNOSIS — C3490 Malignant neoplasm of unspecified part of unspecified bronchus or lung: Secondary | ICD-10-CM | POA: Diagnosis not present

## 2021-09-09 DIAGNOSIS — C3432 Malignant neoplasm of lower lobe, left bronchus or lung: Secondary | ICD-10-CM

## 2021-09-09 DIAGNOSIS — J984 Other disorders of lung: Secondary | ICD-10-CM | POA: Diagnosis not present

## 2021-09-09 DIAGNOSIS — J90 Pleural effusion, not elsewhere classified: Secondary | ICD-10-CM | POA: Diagnosis not present

## 2021-09-09 NOTE — Progress Notes (Signed)
? ?   ?Laura [name] ?      Laura [name] ?            [phone]   ? ? ?HPI: Laura [name] returns for scheduled follow-up visit after left lower lobectomy. ? ?Laura [name] is a 76 year old woman with a history of Takotsubo's, MI, left bundle, ischemic cardiomyopathy, hypertension, hyperlipidemia, hypoglycemia, hypothyroidism, obesity, anxiety, depression, asthma, and memory loss.  She is a lifelong non-smoker who was found to have a lung nodule in 2021.  It increased in size over time. ? ?She underwent robotic left lower lobectomy on 05/30/2021 for T1, N0, stage Ia adenocarcinoma.  Tumor measured about 2 cm in diameter.  She had a lot of issues with pain initially.  That was improving when I saw her in January, although she did still have some intercostal neuralgia symptoms.  Her primary issues were GI related at that time. ? ?She still has a poor appetite.  She is lost weight but has noted that her abdomen has become more distended.  She did see a gastroenterologist and is being evaluated for that.  She continues to have numbness in the left upper quadrant area with occasional sharp stabbing pain along the costal margin.  She stopped taking gabapentin because she was concerned she would get addicted. ? ?Past Medical History:  ?Diagnosis Date  ? Anemia   ? Anxiety   ? Asthma   ? related to sesonal allergies  ? Chronic combined systolic and diastolic CHF, NYHA class 2 ([place]) CARDIOLOGIST-  DR CROITORU  ? Coronary artery disease   ? Depression   ? History of kidney stones   ? History of non-ST elevation myocardial infarction (NSTEMI) 11/27/2009  ? SECONDARY TO TAKOTSUDO SYNDROME (CARDIAC CATH NORMAL)  ? Hyperlipemia   ? Hypertension   ? Hypoglycemia   ? Hypothyroidism   ? LBBB (left bundle branch block)   ? Left ventricular ejection fraction less than 40%   ? 38% PER CARDIOLOGIST NOTE (DR CROITORU)  ? Memory loss   ? Mood swings   ? Myocardial infarction [facility]) 2011  ? Nonischemic dilated  cardiomyopathy ([place])   ? MODERATELY DEPRESSED LVF;EF 35-45% by Echo 05/27/11  ? OSA (obstructive sleep apnea) MODERATE PER STUDY 2005  ? CPAP NONCOMPLIANT  ? Pre-diabetes   ? Seasonal allergies   ? SUI (stress urinary incontinence, female)   ? ? ?Current Outpatient Medications  ?Medication Sig Dispense Refill  ? atorvastatin (LIPITOR) 80 MG tablet TAKE 1/2 TABLET BY MOUTH DAILY FOR CHOLESTEROL 45 tablet 3  ? diazepam (VALIUM) 5 MG tablet Take 1 tablet (5 mg total) by mouth every 12 (twelve) hours as needed for muscle spasms. (Patient taking differently: Take 10 mg by mouth every 12 (twelve) hours as needed for muscle spasms.) 10 tablet 0  ? gabapentin (NEURONTIN) 300 MG capsule Take 1 capsule (300 mg total) by mouth 3 (three) times daily. 90 capsule 5  ? hydrALAZINE (APRESOLINE) 25 MG tablet Take 25 mg by mouth 3 (three) times daily.    ? HYDROcodone-acetaminophen (NORCO/VICODIN) 5-325 MG tablet Take 1-2 tablets by mouth every 6 (six) hours as needed for moderate pain. 30 tablet 0  ? irbesartan (AVAPRO) 300 MG tablet Take 300 mg by mouth daily.    ? levothyroxine (SYNTHROID, LEVOTHROID) 88 MCG tablet Take 88 mcg by mouth daily before breakfast.    ? metoprolol succinate (TOPROL-XL) 50 MG 24 hr tablet TAKE ONE AND ONE-HALF TABLETS BY MOUTH DAILY 90 tablet  3  ? NUVIGIL 250 MG tablet Take 125 mg by mouth daily as needed (energy boost).  3  ? linaclotide (LINZESS) 145 MCG CAPS capsule Take 145 mcg by mouth daily as needed (constipation). (Patient not taking: Reported on 09/09/2021)    ? Melatonin 3 MG CAPS Take 6 mg by mouth at bedtime. At bedtime (Patient not taking: Reported on 09/09/2021)    ? ondansetron (ZOFRAN) 8 MG tablet Take 8 mg by mouth every 8 (eight) hours as needed for nausea or vomiting. (Patient not taking: Reported on 09/09/2021)    ? ?No current facility-administered medications for this visit.  ? ? ?Physical Exam ?BP (!) 170/80   Pulse 94   Resp 20   Ht 5\' 3"  (1.6 m)   Wt 165 lb (74.8 kg)   SpO2 93%  Comment: RA  BMI 29.50 kg/m?  ?76 year old woman in no acute distress ?Alert and oriented x3 with no focal deficits ?Lungs diminished at left base but otherwise clear ?Incisions well-healed ? ?Diagnostic Tests: ?I personally reviewed her chest x-ray.  Shows postop changes from left lower lobectomy.  No active disease. ? ?Impression: ?Laura [name] is a 76 year old woman with a history of Takotsubo's, MI, left bundle, ischemic cardiomyopathy, hypertension, hyperlipidemia, hypoglycemia, hypothyroidism, obesity, anxiety, depression, asthma, and memory loss.   ? ?Stage Ia adenocarcinoma left lower lobe-status post left lower lobectomy.  Did not require adjuvant therapy.  Saw Dr. Julien Nordmann.  We will see him back in July with a CT of the chest. ? ?Postoperative intercostal neuralgia-still having symptoms.  Definitely better than she was during her initial postoperative visits.  She stopped taking gabapentin because of concerns about addiction.  She was not having any adverse effects from that.  If her symptoms worsen we can discuss restarting that medication.  She does not want to resume it now. ? ?Poor appetite, nausea, constipation-following with gastroenterology. ? ?Plan: ?She will let us know if pain worsens ?Follow-up with gastroenterology regarding abdominal issues. ?Return in August after she sees Dr. Julien Nordmann and has a repeat CT. ? ?Melrose Nakayama, MD ?[facility] ?[phone] ? ? ? ?

## 2021-09-16 DIAGNOSIS — Z1152 Encounter for screening for COVID-19: Secondary | ICD-10-CM | POA: Diagnosis not present

## 2021-10-07 DIAGNOSIS — Z1152 Encounter for screening for COVID-19: Secondary | ICD-10-CM | POA: Diagnosis not present

## 2021-10-22 DIAGNOSIS — G8929 Other chronic pain: Secondary | ICD-10-CM | POA: Diagnosis not present

## 2021-10-22 DIAGNOSIS — K5904 Chronic idiopathic constipation: Secondary | ICD-10-CM | POA: Diagnosis not present

## 2021-10-22 DIAGNOSIS — M5441 Lumbago with sciatica, right side: Secondary | ICD-10-CM | POA: Diagnosis not present

## 2021-10-22 DIAGNOSIS — H6123 Impacted cerumen, bilateral: Secondary | ICD-10-CM | POA: Diagnosis not present

## 2021-10-22 DIAGNOSIS — E039 Hypothyroidism, unspecified: Secondary | ICD-10-CM | POA: Diagnosis not present

## 2021-10-22 DIAGNOSIS — F331 Major depressive disorder, recurrent, moderate: Secondary | ICD-10-CM | POA: Diagnosis not present

## 2021-10-22 DIAGNOSIS — F411 Generalized anxiety disorder: Secondary | ICD-10-CM | POA: Diagnosis not present

## 2021-10-22 DIAGNOSIS — G4730 Sleep apnea, unspecified: Secondary | ICD-10-CM | POA: Diagnosis not present

## 2021-10-22 DIAGNOSIS — I10 Essential (primary) hypertension: Secondary | ICD-10-CM | POA: Diagnosis not present

## 2021-10-22 DIAGNOSIS — H9313 Tinnitus, bilateral: Secondary | ICD-10-CM | POA: Diagnosis not present

## 2021-10-22 DIAGNOSIS — M25512 Pain in left shoulder: Secondary | ICD-10-CM | POA: Diagnosis not present

## 2021-10-22 DIAGNOSIS — E7800 Pure hypercholesterolemia, unspecified: Secondary | ICD-10-CM | POA: Diagnosis not present

## 2021-10-27 DIAGNOSIS — M47816 Spondylosis without myelopathy or radiculopathy, lumbar region: Secondary | ICD-10-CM | POA: Diagnosis not present

## 2021-10-28 DIAGNOSIS — Z1152 Encounter for screening for COVID-19: Secondary | ICD-10-CM | POA: Diagnosis not present

## 2021-10-28 DIAGNOSIS — M7502 Adhesive capsulitis of left shoulder: Secondary | ICD-10-CM | POA: Diagnosis not present

## 2021-10-28 DIAGNOSIS — M170 Bilateral primary osteoarthritis of knee: Secondary | ICD-10-CM | POA: Diagnosis not present

## 2021-11-19 DIAGNOSIS — H903 Sensorineural hearing loss, bilateral: Secondary | ICD-10-CM | POA: Diagnosis not present

## 2021-12-02 DIAGNOSIS — R300 Dysuria: Secondary | ICD-10-CM | POA: Diagnosis not present

## 2021-12-02 DIAGNOSIS — R8279 Other abnormal findings on microbiological examination of urine: Secondary | ICD-10-CM | POA: Diagnosis not present

## 2021-12-02 DIAGNOSIS — N952 Postmenopausal atrophic vaginitis: Secondary | ICD-10-CM | POA: Diagnosis not present

## 2021-12-02 DIAGNOSIS — N760 Acute vaginitis: Secondary | ICD-10-CM | POA: Diagnosis not present

## 2021-12-03 DIAGNOSIS — H9313 Tinnitus, bilateral: Secondary | ICD-10-CM | POA: Diagnosis not present

## 2021-12-03 DIAGNOSIS — J300 Vasomotor rhinitis: Secondary | ICD-10-CM | POA: Diagnosis not present

## 2021-12-03 DIAGNOSIS — H903 Sensorineural hearing loss, bilateral: Secondary | ICD-10-CM | POA: Diagnosis not present

## 2021-12-11 ENCOUNTER — Emergency Department (HOSPITAL_COMMUNITY): Payer: Medicare Other

## 2021-12-11 ENCOUNTER — Other Ambulatory Visit: Payer: Self-pay

## 2021-12-11 ENCOUNTER — Encounter (HOSPITAL_COMMUNITY): Payer: Self-pay

## 2021-12-11 ENCOUNTER — Emergency Department (HOSPITAL_COMMUNITY)
Admission: EM | Admit: 2021-12-11 | Discharge: 2021-12-11 | Disposition: A | Discharge status: Home / Self Care | Payer: Medicare Other | Attending: Emergency Medicine | Admitting: Emergency Medicine

## 2021-12-11 DIAGNOSIS — I700 Atherosclerosis of aorta: Secondary | ICD-10-CM | POA: Diagnosis not present

## 2021-12-11 DIAGNOSIS — R451 Restlessness and agitation: Secondary | ICD-10-CM | POA: Insufficient documentation

## 2021-12-11 DIAGNOSIS — M62830 Muscle spasm of back: Secondary | ICD-10-CM | POA: Diagnosis not present

## 2021-12-11 DIAGNOSIS — Z85118 Personal history of other malignant neoplasm of bronchus and lung: Secondary | ICD-10-CM | POA: Diagnosis not present

## 2021-12-11 DIAGNOSIS — E039 Hypothyroidism, unspecified: Secondary | ICD-10-CM | POA: Diagnosis not present

## 2021-12-11 DIAGNOSIS — R0689 Other abnormalities of breathing: Secondary | ICD-10-CM | POA: Diagnosis not present

## 2021-12-11 DIAGNOSIS — K5900 Constipation, unspecified: Secondary | ICD-10-CM | POA: Insufficient documentation

## 2021-12-11 DIAGNOSIS — M545 Low back pain, unspecified: Secondary | ICD-10-CM | POA: Diagnosis not present

## 2021-12-11 DIAGNOSIS — R1084 Generalized abdominal pain: Secondary | ICD-10-CM | POA: Diagnosis not present

## 2021-12-11 DIAGNOSIS — R45850 Homicidal ideations: Secondary | ICD-10-CM | POA: Diagnosis not present

## 2021-12-11 DIAGNOSIS — R091 Pleurisy: Secondary | ICD-10-CM | POA: Insufficient documentation

## 2021-12-11 DIAGNOSIS — R0781 Pleurodynia: Secondary | ICD-10-CM | POA: Diagnosis not present

## 2021-12-11 DIAGNOSIS — I10 Essential (primary) hypertension: Secondary | ICD-10-CM | POA: Diagnosis not present

## 2021-12-11 DIAGNOSIS — R45851 Suicidal ideations: Secondary | ICD-10-CM | POA: Diagnosis not present

## 2021-12-11 DIAGNOSIS — Z79899 Other long term (current) drug therapy: Secondary | ICD-10-CM | POA: Diagnosis not present

## 2021-12-11 DIAGNOSIS — F29 Unspecified psychosis not due to a substance or known physiological condition: Secondary | ICD-10-CM | POA: Diagnosis not present

## 2021-12-11 DIAGNOSIS — K5904 Chronic idiopathic constipation: Secondary | ICD-10-CM | POA: Diagnosis not present

## 2021-12-11 DIAGNOSIS — R0902 Hypoxemia: Secondary | ICD-10-CM | POA: Diagnosis not present

## 2021-12-11 DIAGNOSIS — M549 Dorsalgia, unspecified: Secondary | ICD-10-CM | POA: Diagnosis present

## 2021-12-11 HISTORY — DX: Malignant (primary) neoplasm, unspecified: C80.1

## 2021-12-11 LAB — BASIC METABOLIC PANEL
Anion gap: 10 (ref 5–15)
BUN: 15 mg/dL (ref 8–23)
CO2: 20 mmol/L — ABNORMAL LOW (ref 22–32)
Calcium: 9.3 mg/dL (ref 8.9–10.3)
Chloride: 111 mmol/L (ref 98–111)
Creatinine, Ser: 0.9 mg/dL (ref 0.44–1.00)
GFR, Estimated: >60 mL/min (ref >60)
Glucose, Bld: 85 mg/dL (ref 70–99)
Potassium: 4.4 mmol/L (ref 3.5–5.1)
Sodium: 141 mmol/L (ref 135–145)

## 2021-12-11 LAB — CBC WITH DIFFERENTIAL/PLATELET
Abs Immature Granulocytes: 0.04 10*3/uL (ref 0.00–0.07)
Basophils Absolute: 0.1 10*3/uL (ref 0.0–0.1)
Basophils Relative: 1 %
Eosinophils Absolute: 0.1 10*3/uL (ref 0.0–0.5)
Eosinophils Relative: 0 %
HCT: 39.1 % (ref 36.0–46.0)
Hemoglobin: 12.2 g/dL (ref 12.0–15.0)
Immature Granulocytes: 0 %
Lymphocytes Relative: 15 %
Lymphs Abs: 1.8 10*3/uL (ref 0.7–4.0)
MCH: 26.2 pg (ref 26.0–34.0)
MCHC: 31.2 g/dL (ref 30.0–36.0)
MCV: 84.1 fL (ref 80.0–100.0)
Monocytes Absolute: 1.1 10*3/uL — ABNORMAL HIGH (ref 0.1–1.0)
Monocytes Relative: 9 %
Neutro Abs: 8.8 10*3/uL — ABNORMAL HIGH (ref 1.7–7.7)
Neutrophils Relative %: 75 %
Platelets: 214 10*3/uL (ref 150–400)
RBC: 4.65 MIL/uL (ref 3.87–5.11)
RDW: 15.7 % — ABNORMAL HIGH (ref 11.5–15.5)
WBC: 11.8 10*3/uL — ABNORMAL HIGH (ref 4.0–10.5)
nRBC: 0 % (ref 0.0–0.2)

## 2021-12-11 NOTE — ED Notes (Signed)
Provider at bedside

## 2021-12-11 NOTE — ED Notes (Signed)
Patient transported to X-ray 

## 2021-12-11 NOTE — Discharge Instructions (Signed)
Follow-up with your primary care doctor if your pain continues.  Continue to work on make an appointment with a gastroenterologist regarding your ongoing constipation.  Return to the emergency room if you have any worsening symptoms.

## 2021-12-11 NOTE — ED Provider Notes (Signed)
[facility] EMERGENCY DEPARTMENT Provider Note   CSN: [id number] Arrival date & time: 12/11/21  1424     History  Chief Complaint  Patient presents with   Agitation    Laura [name] is a 76 y.o. female.  Patient is a 76 year old female who has a history of prior lung cancer status post left lower lobe lobectomy, Takotsubo's, ischemic cardiomyopathy, hypertension, hyperlipidemia, anxiety.  She presents with back pain and some left-sided rib pain.  She has had some chronic issues with back pain and chronic issues with pain or she had the surgery on her lung.  She said today they both seem to flareup at the same time.  She did not have any reported injury.  She was also having some muscle cramping in her legs which she has had in the past.  However since all the pain came the same time, it made her very anxious and she had a panic-like attack.  Per family who is at bedside, she has done this before when she has had a lot of pain issues hit her at the same time.  She seems to spiral out of control and has severe anxiety.  She went to her PCP this morning.  Unable to review their notes as they are not in our system but the family states she got a shot of Toradol and a shot of Haldol.  She seems to be much better at this time.  She does not really complain of much pain other than she has a little bit of pain in her left lung area.  No other chest type pain.  No shortness of breath.  Her back pain has resolved.  There is no numbness or weakness to her legs.  She does have constipation which is chronic for her and unchanged over the last 2 years.  She is followed by gastroenterology.  She is trying to get into see a new gastroenterologist.  No recent fevers.  She is currently being treated for a UTI and started on Macrobid yesterday.       Home Medications Prior to Admission medications   Medication Sig Start Date End Date Taking? Authorizing Provider  atorvastatin (LIPITOR) 80  MG tablet TAKE 1/2 TABLET BY MOUTH DAILY FOR CHOLESTEROL 04/24/15   Croitoru, Mihai, MD  diazepam (VALIUM) 5 MG tablet Take 1 tablet (5 mg total) by mouth every 12 (twelve) hours as needed for muscle spasms. Patient taking differently: Take 10 mg by mouth every 12 (twelve) hours as needed for muscle spasms. 05/12/20   Isla Pence, MD  hydrALAZINE (APRESOLINE) 25 MG tablet Take 25 mg by mouth 3 (three) times daily.    [provider]  HYDROcodone-acetaminophen (NORCO/VICODIN) 5-325 MG tablet Take 1-2 tablets by mouth every 6 (six) hours as needed for moderate pain. 06/17/21 06/17/22  Melrose Nakayama, MD  irbesartan (AVAPRO) 300 MG tablet Take 300 mg by mouth daily.    [provider]  levothyroxine (SYNTHROID, LEVOTHROID) 88 MCG tablet Take 88 mcg by mouth daily before breakfast.    [provider]  linaclotide (LINZESS) 145 MCG CAPS capsule Take 145 mcg by mouth daily as needed (constipation). Patient not taking: Reported on 09/09/2021    [provider]  Melatonin 3 MG CAPS Take 6 mg by mouth at bedtime. At bedtime Patient not taking: Reported on 09/09/2021    [provider]  metoprolol succinate (TOPROL-XL) 50 MG 24 hr tablet TAKE ONE AND ONE-HALF TABLETS BY MOUTH DAILY  12/31/20   Troy Sine, MD  NUVIGIL 250 MG tablet Take 125 mg by mouth daily as needed (energy boost). 06/14/15   [provider]  ondansetron (ZOFRAN) 8 MG tablet Take 8 mg by mouth every 8 (eight) hours as needed for nausea or vomiting. Patient not taking: Reported on 09/09/2021    Shirline Frees, MD      Allergies    Codeine, Ciprofloxacin, Ace inhibitors, Citalopram, Fetzima [levomilnacipran], Lasix [furosemide], Nsaids, Sulfamethoxazole-trimethoprim, and Xanax xr [alprazolam er]    Review of Systems   Review of Systems  Constitutional:  Negative for chills, diaphoresis, fatigue and fever.  HENT:  Negative for congestion, rhinorrhea and sneezing.   Eyes:  Negative.   Respiratory:  Negative for cough, chest tightness and shortness of breath.   Cardiovascular:  Positive for chest pain. Negative for leg swelling.  Gastrointestinal:  Negative for abdominal pain, blood in stool, diarrhea, nausea and vomiting.  Genitourinary:  Negative for difficulty urinating, flank pain, frequency and hematuria.  Musculoskeletal:  Positive for back pain. Negative for arthralgias.  Skin:  Negative for rash.  Neurological:  Negative for dizziness, speech difficulty, weakness, numbness and headaches.  Psychiatric/Behavioral:  Positive for agitation.     Physical Exam Updated Vital Signs BP (!) 116/58   Pulse 62   Temp 98.4 F (36.9 C) (Oral)   Resp 18   Ht 5\' 3"  (1.6 m)   Wt 74.8 kg   SpO2 94%   BMI 29.21 kg/m  Physical Exam Constitutional:      Appearance: She is well-developed.  HENT:     Head: Normocephalic and atraumatic.  Eyes:     Pupils: Pupils are equal, round, and reactive to light.  Cardiovascular:     Rate and Rhythm: Normal rate and regular rhythm.     Heart sounds: Normal heart sounds.  Pulmonary:     Effort: Pulmonary effort is normal. No respiratory distress.     Breath sounds: Normal breath sounds. No wheezing or rales.     Comments: Mild pain on palpation of the left chest wall, no crepitus or deformity, incisions are well-healed.  No redness or signs of infection. Chest:     Chest wall: Tenderness present.  Abdominal:     General: Bowel sounds are normal.     Palpations: Abdomen is soft.     Tenderness: There is no abdominal tenderness. There is no guarding or rebound.  Musculoskeletal:        General: Normal range of motion.     Cervical back: Normal range of motion and neck supple.     Comments: No pain along the spine.  No pain to the musculature of the low back.  Lymphadenopathy:     Cervical: No cervical adenopathy.  Skin:    General: Skin is warm and dry.     Findings: No rash.  Neurological:     Mental Status: She  is alert and oriented to person, place, and time.     ED Results / Procedures / Treatments   Labs (all labs ordered are listed, but only abnormal results are displayed) Labs Reviewed  BASIC METABOLIC PANEL - Abnormal; Notable for the following components:      Result Value   CO2 20 (*)    All other components within normal limits  CBC WITH DIFFERENTIAL/PLATELET - Abnormal; Notable for the following components:   WBC 11.8 (*)    RDW 15.7 (*)    Neutro Abs 8.8 (*)    Monocytes  Absolute 1.1 (*)    All other components within normal limits    EKG None  Radiology DG ABD ACUTE 2+V W 1V CHEST  Result Date: 12/11/2021 CLINICAL DATA:  left side rib pain, constipation EXAM: DG ABDOMEN ACUTE WITH 1 VIEW CHEST COMPARISON:  Chest x-ray dated September 09, 2021 FINDINGS: There is no evidence of dilated bowel loops or free intraperitoneal air. No radiopaque calculi or other significant radiographic abnormality is seen. Heart size and mediastinal contours are within normal limits. Atheromatous calcifications of the arch of the aorta. Both lungs clear without evidence of consolidation, pleural effusion or vascular congestion. Moderate thoracolumbar spondylosis. IMPRESSION: Bowel-gas pattern is unremarkable. No radiographic evidence of constipation. Moderate thoracolumbar spondylosis. No acute cardiopulmonary disease. Electronically Signed   By: Frazier Richards M.D.   On: 12/11/2021 16:19    Procedures Procedures    Medications Ordered in ED Medications - No data to display  ED Course/ Medical Decision Making/ A&P                           Medical Decision Making Amount and/or Complexity of Data Reviewed Labs: ordered. Radiology: ordered.   Patient is a 76 year old who presents after she had an episode of back pain, left lung pain and some cramping in her legs.  She has had of all of the symptoms in the past but says they all came together and caused her to have an anxiety/panic attack.  She does  not have any recent injuries.  She has had similar pain in her lungs intermittently since the surgery that she had.  She does not have other symptoms that sound more concerning for PE or ACS.  Her EKG does not show any ischemic changes.  She has a chronic left bundle branch block.  Chest x-ray was performed which showed no evidence of pneumothorax or infiltrate.  This was interpreted by me and confirmed by the radiologist.  She has had some chronic constipation issues.  She requested imaging of her abdomen.  Given its been going on for 2+ years, I did not feel that a CT was indicated.  However we did include an x-ray of her abdomen with a chest x-ray.  There is no evidence of increased stool burden or obstruction.  This was interpreted by me and confirmed by the radiologist.  Her back pain has resolved.  She has no other complaints of pain.  She is neurologically intact.  She is calm and cooperative and at her normal mental status.  She is alert and oriented x3.  Is discharged with family.  She will follow-up with her primary care doctor and is continuing to try to get into a gastroenterologist regarding her longstanding issues with constipation.  Final Clinical Impression(s) / ED Diagnoses Final diagnoses:  Agitation  Acute bilateral low back pain without sciatica  Pleurisy    Rx / DC Orders ED Discharge Orders     None         Malvin Johns, MD 12/11/21 Bosie Helper

## 2021-12-16 ENCOUNTER — Telehealth: Payer: Self-pay | Admitting: Internal Medicine

## 2021-12-16 DIAGNOSIS — M170 Bilateral primary osteoarthritis of knee: Secondary | ICD-10-CM | POA: Diagnosis not present

## 2021-12-16 NOTE — Telephone Encounter (Signed)
Called patient regarding upcoming July appointments, patient has been called and voicemail was left.

## 2021-12-17 ENCOUNTER — Encounter: Payer: Self-pay | Admitting: *Deleted

## 2021-12-17 NOTE — Progress Notes (Signed)
I followed up on Laura [name]'s treatment plan. She is is on observation and is scheduled for follow up with Dr. Julien Nordmann.

## 2021-12-24 DIAGNOSIS — M5459 Other low back pain: Secondary | ICD-10-CM | POA: Diagnosis not present

## 2021-12-24 DIAGNOSIS — M170 Bilateral primary osteoarthritis of knee: Secondary | ICD-10-CM | POA: Diagnosis not present

## 2021-12-24 DIAGNOSIS — M47816 Spondylosis without myelopathy or radiculopathy, lumbar region: Secondary | ICD-10-CM | POA: Diagnosis not present

## 2021-12-29 DIAGNOSIS — R300 Dysuria: Secondary | ICD-10-CM | POA: Diagnosis not present

## 2021-12-29 DIAGNOSIS — N9089 Other specified noninflammatory disorders of vulva and perineum: Secondary | ICD-10-CM | POA: Diagnosis not present

## 2021-12-29 DIAGNOSIS — N760 Acute vaginitis: Secondary | ICD-10-CM | POA: Diagnosis not present

## 2021-12-31 DIAGNOSIS — M170 Bilateral primary osteoarthritis of knee: Secondary | ICD-10-CM | POA: Diagnosis not present

## 2022-01-01 ENCOUNTER — Telehealth: Payer: Self-pay | Admitting: Internal Medicine

## 2022-01-01 NOTE — Telephone Encounter (Signed)
.  Called pt per 7/5 inbasket , Patient was unavailable, a message with appt time and date was left with number on file.  Directed pt to call central radioloigy first then call back for [facility] appts

## 2022-01-05 ENCOUNTER — Other Ambulatory Visit: Payer: Medicare Other

## 2022-01-06 ENCOUNTER — Other Ambulatory Visit (HOSPITAL_COMMUNITY): Payer: Medicare Other

## 2022-01-06 ENCOUNTER — Other Ambulatory Visit: Payer: Medicare Other

## 2022-01-06 ENCOUNTER — Other Ambulatory Visit: Payer: Self-pay | Admitting: Cardiovascular Disease

## 2022-01-07 ENCOUNTER — Ambulatory Visit: Payer: Medicare Other | Admitting: Internal Medicine

## 2022-01-13 ENCOUNTER — Other Ambulatory Visit: Payer: Self-pay | Admitting: Internal Medicine

## 2022-01-13 ENCOUNTER — Telehealth: Payer: Self-pay | Admitting: *Deleted

## 2022-01-13 ENCOUNTER — Inpatient Hospital Stay: Payer: Medicare Other | Attending: Internal Medicine

## 2022-01-13 ENCOUNTER — Other Ambulatory Visit: Payer: Self-pay | Admitting: *Deleted

## 2022-01-13 ENCOUNTER — Ambulatory Visit (HOSPITAL_COMMUNITY)
Admission: RE | Admit: 2022-01-13 | Discharge: 2022-01-13 | Disposition: A | Discharge status: Home / Self Care | Payer: Medicare Other | Source: Ambulatory Visit | Attending: Internal Medicine | Admitting: Internal Medicine

## 2022-01-13 ENCOUNTER — Encounter: Payer: Self-pay | Admitting: Internal Medicine

## 2022-01-13 ENCOUNTER — Other Ambulatory Visit: Payer: Self-pay

## 2022-01-13 ENCOUNTER — Other Ambulatory Visit (HOSPITAL_COMMUNITY): Payer: Self-pay

## 2022-01-13 DIAGNOSIS — Z7901 Long term (current) use of anticoagulants: Secondary | ICD-10-CM | POA: Insufficient documentation

## 2022-01-13 DIAGNOSIS — I5042 Chronic combined systolic (congestive) and diastolic (congestive) heart failure: Secondary | ICD-10-CM | POA: Insufficient documentation

## 2022-01-13 DIAGNOSIS — I2699 Other pulmonary embolism without acute cor pulmonale: Secondary | ICD-10-CM

## 2022-01-13 DIAGNOSIS — Z90710 Acquired absence of both cervix and uterus: Secondary | ICD-10-CM | POA: Insufficient documentation

## 2022-01-13 DIAGNOSIS — C3490 Malignant neoplasm of unspecified part of unspecified bronchus or lung: Secondary | ICD-10-CM | POA: Insufficient documentation

## 2022-01-13 DIAGNOSIS — I110 Hypertensive heart disease with heart failure: Secondary | ICD-10-CM | POA: Insufficient documentation

## 2022-01-13 DIAGNOSIS — J8410 Pulmonary fibrosis, unspecified: Secondary | ICD-10-CM | POA: Diagnosis not present

## 2022-01-13 DIAGNOSIS — Z85118 Personal history of other malignant neoplasm of bronchus and lung: Secondary | ICD-10-CM | POA: Insufficient documentation

## 2022-01-13 LAB — CMP (CANCER CENTER ONLY)
ALT: 12 U/L (ref 0–44)
AST: 14 U/L — ABNORMAL LOW (ref 15–41)
Albumin: 3.9 g/dL (ref 3.5–5.0)
Alkaline Phosphatase: 64 U/L (ref 38–126)
Anion gap: 2 — ABNORMAL LOW (ref 5–15)
BUN: 14 mg/dL (ref 8–23)
CO2: 32 mmol/L (ref 22–32)
Calcium: 9.6 mg/dL (ref 8.9–10.3)
Chloride: 106 mmol/L (ref 98–111)
Creatinine: 0.93 mg/dL (ref 0.44–1.00)
GFR, Estimated: >60 mL/min (ref >60)
Glucose, Bld: 91 mg/dL (ref 70–99)
Potassium: 5.1 mmol/L (ref 3.5–5.1)
Sodium: 140 mmol/L (ref 135–145)
Total Bilirubin: 0.4 mg/dL (ref 0.3–1.2)
Total Protein: 6.7 g/dL (ref 6.5–8.1)

## 2022-01-13 LAB — CBC WITH DIFFERENTIAL (CANCER CENTER ONLY)
Abs Immature Granulocytes: 0.02 10*3/uL (ref 0.00–0.07)
Basophils Absolute: 0.1 10*3/uL (ref 0.0–0.1)
Basophils Relative: 1 %
Eosinophils Absolute: 0.1 10*3/uL (ref 0.0–0.5)
Eosinophils Relative: 2 %
HCT: 39.1 % (ref 36.0–46.0)
Hemoglobin: 12.6 g/dL (ref 12.0–15.0)
Immature Granulocytes: 0 %
Lymphocytes Relative: 25 %
Lymphs Abs: 2.1 10*3/uL (ref 0.7–4.0)
MCH: 26.4 pg (ref 26.0–34.0)
MCHC: 32.2 g/dL (ref 30.0–36.0)
MCV: 81.8 fL (ref 80.0–100.0)
Monocytes Absolute: 0.8 10*3/uL (ref 0.1–1.0)
Monocytes Relative: 9 %
Neutro Abs: 5.1 10*3/uL (ref 1.7–7.7)
Neutrophils Relative %: 63 %
Platelet Count: 250 10*3/uL (ref 150–400)
RBC: 4.78 MIL/uL (ref 3.87–5.11)
RDW: 14.7 % (ref 11.5–15.5)
WBC Count: 8.2 10*3/uL (ref 4.0–10.5)
nRBC: 0 % (ref 0.0–0.2)

## 2022-01-13 MED ORDER — APIXABAN 5 MG PO TABS
5.0000 mg | ORAL_TABLET | Freq: Two times a day (BID) | ORAL | 3 refills | Status: DC
  Filled 2022-01-13: qty 60, 30d supply, fill #0
  Filled 2022-02-09 – 2022-02-10 (×4): qty 60, 30d supply, fill #1
  Filled 2022-03-10: qty 60, 30d supply, fill #2
  Filled 2022-04-13: qty 60, 30d supply, fill #3

## 2022-01-13 MED ORDER — IOHEXOL 300 MG/ML  SOLN
75.0000 mL | Freq: Once | INTRAMUSCULAR | Status: AC | PRN
  Administered 2022-01-13: 75 mL via INTRAVENOUS

## 2022-01-13 MED ORDER — APIXABAN 5 MG PO TABS
ORAL_TABLET | ORAL | 0 refills | Status: DC
  Filled 2022-01-13: qty 30, 30d supply, fill #0

## 2022-01-13 MED ORDER — APIXABAN 5 MG PO TABS: 5.0000 mg | ORAL_TABLET | Freq: Two times a day (BID) | ORAL | 3 refills | Status: DC

## 2022-01-13 NOTE — Telephone Encounter (Signed)
Contacted patient per message from Dr. Julien Nordmann: She had scan today that showed pulmonary embolism.  I will send a prescription of Eliquis to her pharmacy[facility] [place] she will need to start today.  I am sure she has an appointment with me soon.  Please let her know  Gave patient message from Dr. Julien Nordmann. Advised patient to review Eliquis with pharmacist when she picks it up. Reminded her of appt with Dr. Julien Nordmann on 01/19/22. Patient verbalized understanding of information.

## 2022-01-13 NOTE — Telephone Encounter (Signed)
Patient called. [facility] told her RX for Eliquis was $1600.00. Patient states she cannot afford this and did not purchase medication.  Dr. Julien Nordmann informed.   Per Dr.Mohamed, if patient cannot get Eliquis and start taking it tonight, she needs to go to ED. Contacted patient with Dr. Worthy Flank directions to go to ED. Patient states she has a new insurance - in addition to her Medicare which does not pay for medication. It is [employer]. She said it is supposed to cover medicines, but she isn't sure how it works and [employer] would not take it.  [facility]. Asked if they took  [employer] for medication. Their staff attempted to run RX with the [place], but coverage 'not determined'.  [facility] states patient manufacturer offers voucher for one month supply of medication. This may help patient with time to contact [place] and learn about her pharmacy benefits. They can assist patient with filling RX and using voucher when she picks up med.  Patient in agreement for RX to be sent to [facility] and use voucher. States she will pick up medicine this afternoon and take first dose. Dr. Julien Nordmann informed patient will have med today and take first dose. Per Dr. Julien Nordmann - if patient can do this, she does not need to go to ED today.  Contacted [employer] manager/land agent for additional assist - she states Eliquis manufacturer, [employer], has a patient assistance program, but it takes time to set it up for patient. Received patient application from [place] to give to patient to complete.

## 2022-01-13 NOTE — Progress Notes (Signed)
Received staff message from RN regarding assistance with Eliquis.  Called [facility] who provided further information.  Message sent back to RN with information provided by [facility]. Also, advised of [employer] application for assistance for future fills via mail if patient qualifies. Emailed [employer] application to RN for further follow up.

## 2022-01-19 ENCOUNTER — Other Ambulatory Visit: Payer: Self-pay

## 2022-01-19 ENCOUNTER — Inpatient Hospital Stay (HOSPITAL_BASED_OUTPATIENT_CLINIC_OR_DEPARTMENT_OTHER): Payer: Medicare Other | Admitting: Internal Medicine

## 2022-01-19 ENCOUNTER — Encounter: Payer: Self-pay | Admitting: Internal Medicine

## 2022-01-19 VITALS — BP 149/76 | HR 68 | Temp 98.1°F | Resp 18 | Ht 63.0 in | Wt 153.5 lb

## 2022-01-19 DIAGNOSIS — Z90710 Acquired absence of both cervix and uterus: Secondary | ICD-10-CM | POA: Diagnosis not present

## 2022-01-19 DIAGNOSIS — I110 Hypertensive heart disease with heart failure: Secondary | ICD-10-CM | POA: Diagnosis not present

## 2022-01-19 DIAGNOSIS — C3432 Malignant neoplasm of lower lobe, left bronchus or lung: Secondary | ICD-10-CM

## 2022-01-19 DIAGNOSIS — I2699 Other pulmonary embolism without acute cor pulmonale: Secondary | ICD-10-CM | POA: Diagnosis not present

## 2022-01-19 DIAGNOSIS — I5042 Chronic combined systolic (congestive) and diastolic (congestive) heart failure: Secondary | ICD-10-CM | POA: Diagnosis not present

## 2022-01-19 DIAGNOSIS — Z85118 Personal history of other malignant neoplasm of bronchus and lung: Secondary | ICD-10-CM | POA: Diagnosis not present

## 2022-01-19 DIAGNOSIS — Z7901 Long term (current) use of anticoagulants: Secondary | ICD-10-CM | POA: Diagnosis not present

## 2022-01-19 DIAGNOSIS — C3490 Malignant neoplasm of unspecified part of unspecified bronchus or lung: Secondary | ICD-10-CM | POA: Diagnosis not present

## 2022-01-19 NOTE — Progress Notes (Signed)
Laura [name] [phone]   [phone]  OFFICE PROGRESS NOTE  Shirline Frees, MD [place] [address]  DIAGNOSIS:  1) Stage IA (T1c, N0, M0) non-small cell lung cancer, adenocarcinoma diagnosed in December 2022 2) incidental finding of pulmonary embolus within the right middle lobar pulmonary artery on CT scan of 01/13/2022  PRIOR THERAPY:  Status post left lower lobectomy with lymph node dissection under the care of Dr. Roxan Hockey on May 30, 2021.  CURRENT THERAPY: Eliquis 5 mg p.o. twice daily started January 13, 2022.  INTERVAL HISTORY: Laura [name] 76 y.o. female returns to the clinic today for follow-up visit accompanied by her husband.  The patient is feeling fine today with no concerning complaints except for mild fatigue.  She started treatment with Eliquis 5 mg p.o. twice daily on the same day she had a diagnosis of pulmonary embolism on January 13, 2022.  She denied having any current chest pain, shortness of breath except with exertion with no cough or hemoptysis.  She has no nausea, vomiting, diarrhea or constipation.  She has no headache or visual changes.  She denied having any recent weight loss or night sweats.  She had repeat CT scan of the chest performed recently and she is here for evaluation and discussion of her scan results.  MEDICAL HISTORY: Past Medical History:  Diagnosis Date   Anemia    Anxiety    Asthma    related to sesonal allergies   Cancer ([place])    Chronic combined systolic and diastolic CHF, NYHA class 2 ([place]) CARDIOLOGIST-  DR [id number]   Coronary artery disease    Depression    History of kidney stones    History of non-ST elevation myocardial infarction (NSTEMI) 11/27/2009   SECONDARY TO TAKOTSUDO SYNDROME (CARDIAC CATH NORMAL)   Hyperlipemia    Hypertension    Hypoglycemia    Hypothyroidism    LBBB (left bundle branch block)    Left ventricular ejection fraction less than  40%    38% PER CARDIOLOGIST NOTE (DR CROITORU)   Memory loss    Mood swings    Myocardial infarction ([place]) 2011   Nonischemic dilated cardiomyopathy ([place])    MODERATELY DEPRESSED LVF;EF 35-45% by Echo 05/27/11   OSA (obstructive sleep apnea) MODERATE PER STUDY 2005   CPAP NONCOMPLIANT   Pre-diabetes    Seasonal allergies    SUI (stress urinary incontinence, female)     ALLERGIES:  is allergic to codeine, ciprofloxacin, ace inhibitors, citalopram, fetzima [levomilnacipran], lasix [furosemide], nsaids, sulfamethoxazole-trimethoprim, and xanax xr [alprazolam er].  MEDICATIONS:  Current Outpatient Medications  Medication Sig Dispense Refill   apixaban (ELIQUIS) 5 MG TABS tablet Take 1 tablet (5 mg total) by mouth 2 (two) times daily. 60 tablet 3   atorvastatin (LIPITOR) 80 MG tablet TAKE 1/2 TABLET BY MOUTH DAILY FOR CHOLESTEROL 45 tablet 3   diazepam (VALIUM) 5 MG tablet Take 1 tablet (5 mg total) by mouth every 12 (twelve) hours as needed for muscle spasms. (Patient taking differently: Take 10 mg by mouth every 12 (twelve) hours as needed for muscle spasms.) 10 tablet 0   hydrALAZINE (APRESOLINE) 25 MG tablet Take 1 tablet (25 mg total) by mouth 3 (three) times daily. Please schedule appointment for further refills. 90 tablet 0   irbesartan (AVAPRO) 300 MG tablet Take 300 mg by mouth daily.     levothyroxine (SYNTHROID, LEVOTHROID) 88 MCG tablet Take 88 mcg by  mouth daily before breakfast.     Melatonin 10 MG TABS Take 10 mg by mouth at bedtime as needed.     metoprolol succinate (TOPROL-XL) 50 MG 24 hr tablet TAKE ONE AND ONE-HALF TABLETS BY MOUTH DAILY 90 tablet 3   NUVIGIL 250 MG tablet Take 125 mg by mouth daily as needed (energy boost). (Patient not taking: Reported on 01/19/2022)  3   ondansetron (ZOFRAN) 8 MG tablet Take 8 mg by mouth every 8 (eight) hours as needed for nausea or vomiting. (Patient not taking: Reported on 09/09/2021)     No current facility-administered medications  for this visit.    SURGICAL HISTORY:  Past Surgical History:  Procedure Laterality Date   ABDOMINAL HYSTERECTOMY  06/30/1983   partial   CARDIAC CATHETERIZATION  09-04-1999;  08/25/2004;   12/09/2009  DR CROITORU   NORMAL CORONARIES/  APICAL BALLOONING OF LV CONSISTENT WITH TAKOTSUBO SYMPTOMS/ EF 30-35%   CATARACT EXTRACTION W/ INTRAOCULAR LENS  IMPLANT, BILATERAL     CHOLECYSTECTOMY N/A 09/29/2012   Procedure: LAPAROSCOPIC CHOLECYSTECTOMY WITH INTRAOPERATIVE CHOLANGIOGRAM;  Surgeon: Adin Hector, MD;  Location: [facility];  Service: General;  Laterality: N/A;   COLONOSCOPY  07/2020   CYSTOSCOPY N/A 09/19/2012   Procedure: Erlene Quan;  Surgeon: Bernestine Amass, MD;  Location: [facility];  Service: Urology;  Laterality: N/A;   DILATION AND CURETTAGE OF UTERUS     EYE SURGERY Bilateral    cataract removal   INTERCOSTAL NERVE BLOCK  05/30/2021   Procedure: INTERCOSTAL NERVE BLOCK;  Surgeon: Melrose Nakayama, MD;  Location: [facility];  Service: Thoracic;;   KNEE ARTHROSCOPY W/ MENISCECTOMY  07/27/2011   MEDIAL AND LATERAL   LOBECTOMY  05/30/2021   Procedure: LEFT LOWER LOBECTOMY;  Surgeon: Melrose Nakayama, MD;  Location: [facility];  Service: Thoracic;;   LYMPH NODE DISSECTION  05/30/2021   Procedure: LYMPH NODE DISSECTION;  Surgeon: Melrose Nakayama, MD;  Location: [place];  Service: Thoracic;;   NASAL SEPTUM SURGERY  02/28/1979   PUBOVAGINAL SLING N/A 09/19/2012   Procedure: SUBURETHRAL Janyth Pupa;  Surgeon: Bernestine Amass, MD;  Location[facility];  Service: Urology;  Laterality: N/A;   RIGHT URETEROSCOPIC STONE EXTRACTION  08/31/2000   TRANSTHORACIC ECHOCARDIOGRAM  05-27-2011  DR CROITORU   MODERATELY DEPRESSED LVF DUE TO GLOBAL HYPOKINESIS AND MARKED SYSTOLIC ASYNCHRONY/ EF 73%/ MILD LEFT ATRIAL DILATATION    REVIEW OF SYSTEMS:  Constitutional: positive for fatigue Eyes: negative Ears, nose, mouth, throat, and face:  negative Respiratory: positive for dyspnea on exertion Cardiovascular: negative Gastrointestinal: negative Genitourinary:negative Integument/breast: negative Hematologic/lymphatic: negative Musculoskeletal:negative Neurological: negative Behavioral/Psych: negative Endocrine: negative Allergic/Immunologic: negative   PHYSICAL EXAMINATION: General appearance: alert, cooperative, fatigued, and no distress Head: Normocephalic, without obvious abnormality, atraumatic Neck: no adenopathy, no JVD, supple, symmetrical, trachea midline, and thyroid not enlarged, symmetric, no tenderness/mass/nodules Lymph nodes: Cervical, supraclavicular, and axillary nodes normal. Resp: clear to auscultation bilaterally Back: symmetric, no curvature. ROM normal. No CVA tenderness. Cardio: regular rate and rhythm, S1, S2 normal, no murmur, click, rub or gallop GI: soft, non-tender; bowel sounds normal; no masses,  no organomegaly Extremities: extremities normal, atraumatic, no cyanosis or edema Neurologic: Alert and oriented X 3, normal strength and tone. Normal symmetric reflexes. Normal coordination and gait  ECOG PERFORMANCE STATUS: 1 - Symptomatic but completely ambulatory  Blood pressure (!) 149/76, pulse 68, temperature 98.1 F (36.7 C), temperature source Oral, resp. rate 18, height 5\' 3"  (1.6 m), weight 153  lb 8 oz (69.6 kg), SpO2 99 %.  LABORATORY DATA: Lab Results  Component Value Date   WBC 8.2 01/13/2022   HGB 12.6 01/13/2022   HCT 39.1 01/13/2022   MCV 81.8 01/13/2022   PLT 250 01/13/2022      Chemistry      Component Value Date/Time   NA 140 01/13/2022 1142   NA 144 08/30/2020 1550   K 5.1 01/13/2022 1142   CL 106 01/13/2022 1142   CO2 32 01/13/2022 1142   BUN 14 01/13/2022 1142   BUN 10 08/30/2020 1550   CREATININE 0.93 01/13/2022 1142   CREATININE 0.75 05/15/2015 1306      Component Value Date/Time   CALCIUM 9.6 01/13/2022 1142   ALKPHOS 64 01/13/2022 1142   AST 14 (L)  01/13/2022 1142   ALT 12 01/13/2022 1142   BILITOT 0.4 01/13/2022 1142       RADIOGRAPHIC STUDIES:  ASSESSMENT AND PLAN: This is a very pleasant 76 years old white female diagnosed with: 1) Stage IA (T1c, N0, M0) non-small cell lung cancer, adenocarcinoma diagnosed in December 2022 Status post left lower lobectomy with lymph node dissection under the care of Dr. Roxan Hockey on May 30, 2021. The patient is currently on observation and she is feeling fine with no concerning complaints except for mild fatigue and shortness of breath with exertion. She had repeat CT scan of the chest performed recently.  I personally and independently reviewed the scans and discussed the result with the patient and her husband. Her scan showed no concerning findings for disease recurrence or metastasis. I recommended for her to continue on observation with repeat CT scan of the chest in 6 months. 2) incidental finding of pulmonary embolus within the right middle lobar pulmonary artery on CT scan of 01/13/2022.  I started the patient on Eliquis 5 mg p.o. twice daily.  She has been tolerating it fairly well with no significant bleeding issues. The patient had a lot of questions about her recent pulmonary embolism and possible underlying etiology as well as the treatment options.  I answered her questions to the best of her satisfaction. She was advised to call immediately if she has any other concerning symptoms in the interval. The patient voices understanding of current disease status and treatment options and is in agreement with the current care plan.  All questions were answered. The patient knows to call the clinic with any problems, questions or concerns. We can certainly see the patient much sooner if necessary.  The total time spent in the appointment was 30 minutes.  Disclaimer: This note was dictated with voice recognition software. Similar sounding words can inadvertently be transcribed and may not  be corrected upon review.

## 2022-01-26 DIAGNOSIS — K5909 Other constipation: Secondary | ICD-10-CM | POA: Diagnosis not present

## 2022-01-26 DIAGNOSIS — M79604 Pain in right leg: Secondary | ICD-10-CM | POA: Diagnosis not present

## 2022-01-26 DIAGNOSIS — R2241 Localized swelling, mass and lump, right lower limb: Secondary | ICD-10-CM | POA: Diagnosis not present

## 2022-01-26 DIAGNOSIS — Z23 Encounter for immunization: Secondary | ICD-10-CM | POA: Diagnosis not present

## 2022-01-26 DIAGNOSIS — W57XXXA Bitten or stung by nonvenomous insect and other nonvenomous arthropods, initial encounter: Secondary | ICD-10-CM | POA: Diagnosis not present

## 2022-02-02 DIAGNOSIS — K5900 Constipation, unspecified: Secondary | ICD-10-CM | POA: Diagnosis not present

## 2022-02-02 DIAGNOSIS — Z124 Encounter for screening for malignant neoplasm of cervix: Secondary | ICD-10-CM | POA: Diagnosis not present

## 2022-02-02 DIAGNOSIS — R32 Unspecified urinary incontinence: Secondary | ICD-10-CM | POA: Diagnosis not present

## 2022-02-02 DIAGNOSIS — R140 Abdominal distension (gaseous): Secondary | ICD-10-CM | POA: Diagnosis not present

## 2022-02-02 DIAGNOSIS — Z6828 Body mass index (BMI) 28.0-28.9, adult: Secondary | ICD-10-CM | POA: Diagnosis not present

## 2022-02-02 DIAGNOSIS — Z1151 Encounter for screening for human papillomavirus (HPV): Secondary | ICD-10-CM | POA: Diagnosis not present

## 2022-02-02 DIAGNOSIS — M810 Age-related osteoporosis without current pathological fracture: Secondary | ICD-10-CM | POA: Diagnosis not present

## 2022-02-03 ENCOUNTER — Encounter: Payer: Self-pay | Admitting: Thoracic Surgery (Cardiothoracic Vascular Surgery)

## 2022-02-03 ENCOUNTER — Ambulatory Visit (INDEPENDENT_AMBULATORY_CARE_PROVIDER_SITE_OTHER): Payer: Medicare Other | Admitting: Thoracic Surgery (Cardiothoracic Vascular Surgery)

## 2022-02-03 VITALS — BP 119/68 | HR 91 | Resp 20 | Ht 63.0 in | Wt 154.6 lb

## 2022-02-03 DIAGNOSIS — Z902 Acquired absence of lung [part of]: Secondary | ICD-10-CM

## 2022-02-03 DIAGNOSIS — C3432 Malignant neoplasm of lower lobe, left bronchus or lung: Secondary | ICD-10-CM

## 2022-02-03 NOTE — Progress Notes (Signed)
[address]       [place] [address]             [phone]     HPI: Mrs. Maestre returns for follow-up regarding stage Ia adenocarcinoma of the lung.  Fortunata Betty is a 76 year old woman with a history of Takotsubo's, MI, left bundle branch block, ischemic cardiomyopathy, hypertension, hyperlipidemia, hyperglycemia, hypothyroidism, obesity, anxiety, depression, asthma, and memory loss.  She is a lifelong non-smoker.  She was found to have a lung nodule in 2021.  Increased in size over time.  I did a robotic left lower lobectomy for a T1, N0, stage Ia adenocarcinoma.  She had a lot of pain issues with intercostal neuralgia initially.  She was on gabapentin for quite a while.  She is now no longer on that.  She has an occasional sharp pain in the left upper quadrant area.  Her husband says it happens about every 2 weeks or so.  Her primary complaint is fatigue and tiredness with exertion.  She is also having a lot of issues with reflux.  She saw Dr. Julien Nordmann back in July and had a CT of the chest.  It showed a small PE in the right middle lobe pulmonary artery.  He started her on Eliquis.  Past Medical History:  Diagnosis Date   Anemia    Anxiety    Asthma    related to sesonal allergies   Cancer ([place])    Chronic combined systolic and diastolic CHF, NYHA class 2 ([place]) CARDIOLOGIST-  DR [id number]   Coronary artery disease    Depression    History of kidney stones    History of non-ST elevation myocardial infarction (NSTEMI) 11/27/2009   SECONDARY TO TAKOTSUDO SYNDROME (CARDIAC CATH NORMAL)   Hyperlipemia    Hypertension    Hypoglycemia    Hypothyroidism    LBBB (left bundle branch block)    Left ventricular ejection fraction less than 40%    38% PER CARDIOLOGIST NOTE (DR CROITORU)   Memory loss    Mood swings    Myocardial infarction ([place]) 2011   Nonischemic dilated cardiomyopathy ([place])    MODERATELY DEPRESSED LVF;EF 35-45% by Echo 05/27/11   OSA  (obstructive sleep apnea) MODERATE PER STUDY 2005   CPAP NONCOMPLIANT   Pre-diabetes    Seasonal allergies    SUI (stress urinary incontinence, female)     Current Outpatient Medications  Medication Sig Dispense Refill   apixaban (ELIQUIS) 5 MG TABS tablet Take 1 tablet (5 mg total) by mouth 2 (two) times daily. 60 tablet 3   atorvastatin (LIPITOR) 80 MG tablet TAKE 1/2 TABLET BY MOUTH DAILY FOR CHOLESTEROL 45 tablet 3   diazepam (VALIUM) 5 MG tablet Take 1 tablet (5 mg total) by mouth every 12 (twelve) hours as needed for muscle spasms. (Patient taking differently: Take 10 mg by mouth every 12 (twelve) hours as needed for muscle spasms.) 10 tablet 0   hydrALAZINE (APRESOLINE) 25 MG tablet Take 1 tablet (25 mg total) by mouth 3 (three) times daily. Please schedule appointment for further refills. 90 tablet 0   irbesartan (AVAPRO) 300 MG tablet Take 300 mg by mouth daily.     levothyroxine (SYNTHROID, LEVOTHROID) 88 MCG tablet Take 88 mcg by mouth daily before breakfast.     Melatonin 10 MG TABS Take 5 mg by mouth at bedtime as needed.     metoprolol succinate (TOPROL-XL) 50 MG 24 hr tablet TAKE ONE AND ONE-HALF  TABLETS BY MOUTH DAILY 90 tablet 3   NUVIGIL 250 MG tablet Take 125 mg by mouth daily as needed (energy boost).  3   ondansetron (ZOFRAN) 8 MG tablet Take 8 mg by mouth every 8 (eight) hours as needed for nausea or vomiting.     No current facility-administered medications for this visit.    Physical Exam BP 119/68 (BP Location: Left Arm, Patient Position: Sitting, Cuff Size: Normal)   Pulse 91   Resp 20   Ht 5\' 3"  (1.6 m)   Wt 154 lb 9.6 oz (70.1 kg)   SpO2 92% Comment: RA  BMI 27.46 kg/m  76 year old woman in no acute distress Alert and oriented x 3 with no focal deficits Lungs diminished at left base but otherwise clear Cardiac regular rate and rhythm Incisions well-healed  Diagnostic Tests: CT CHEST WITH CONTRAST   TECHNIQUE: Multidetector CT imaging of the chest  was performed during intravenous contrast administration.   RADIATION DOSE REDUCTION: This exam was performed according to the departmental dose-optimization program which includes automated exposure control, adjustment of the mA and/or kV according to patient size and/or use of iterative reconstruction technique.    CONTRAST:  20mL OMNIPAQUE IOHEXOL 300 MG/ML  SOLN   COMPARISON:  CT AP 03/25/2021   FINDINGS: Cardiovascular: Incidental pulmonary embolus identified within the right middle lobar pulmonary artery, image 75/2. Heart size normal. Aortic atherosclerosis. No pericardial effusion identified.   Mediastinum/Nodes: No enlarged mediastinal, hilar, or axillary lymph nodes. Thyroid gland, trachea, and esophagus demonstrate no significant findings.   Lungs/Pleura: Postoperative changes from left lower lobectomy are identified. There are no complicating features identified. No pleural effusion, airspace consolidation, atelectasis or pneumothorax. Calcified granuloma identified within the right lower lobe, image 103/5. No new suspicious lung nodules identified.   Upper Abdomen: No acute findings within the imaged portions of the upper abdomen. The adrenal glands appear within normal limits. Status post cholecystectomy.   Musculoskeletal: No acute or suspicious osseous findings. There is ankylosis of the T7 through T12 vertebra. Degenerative disc disease noted within the upper thoracic and lower thoracic spine.   IMPRESSION: 1. Postoperative changes from left lower lobectomy. No signs of recurrent tumor or metastatic disease within the chest. 2. Incidental note is made of pulmonary embolus within the right middle lobar pulmonary artery. 3. Aortic Atherosclerosis (ICD10-I70.0).   Electronically Signed: By: Kerby Moors M.D. On: 01/13/2022 13:12 I personally reviewed the CT images.  There is a PE in the right middle lobe PA.  Aortic and coronary atherosclerosis.  No  evidence of recurrent disease.  Impression: Laura [name] is a 76 year old woman with a history of Takotsubo's, MI, left bundle branch block, ischemic cardiomyopathy, hypertension, hyperlipidemia, hyperglycemia, hypothyroidism, obesity, anxiety, depression, asthma, and memory loss.  She is a lifelong non-smoker.    She underwent a robotic assisted left lower lobectomy for a stage Ia adenocarcinoma in December 2022.  She recently had a follow-up CT which showed no evidence of recurrent disease.  She was noted to have a small PE and was started on Eliquis.  Intercostal neuralgia-has improved dramatically now only happens occasionally but still does have intermittent sharp pain.  She stopped taking gabapentin on her own because she was concerned about getting addicted.  If the pain were to become more frequent or severe we can always start that medication back.  Complains of fatigue and getting very tired with exertion.-She does have a history of Takotsubo's and cardiomyopathy.  Has not seen Dr. Claiborne Billings in about  a year.  I strongly recommended she follow-up with him.  I offered to have my office schedule that appointment for her but she wishes to herself.   Plan: Follow-up with Dr. Claiborne Billings I will plan to see her back in 6 months to check on her progress.  Melrose Nakayama, MD [facility] [phone]

## 2022-02-04 DIAGNOSIS — R519 Headache, unspecified: Secondary | ICD-10-CM | POA: Diagnosis not present

## 2022-02-04 DIAGNOSIS — R200 Anesthesia of skin: Secondary | ICD-10-CM | POA: Diagnosis not present

## 2022-02-04 DIAGNOSIS — R0789 Other chest pain: Secondary | ICD-10-CM | POA: Diagnosis not present

## 2022-02-04 DIAGNOSIS — R2990 Unspecified symptoms and signs involving the nervous system: Secondary | ICD-10-CM | POA: Diagnosis not present

## 2022-02-04 DIAGNOSIS — R9082 White matter disease, unspecified: Secondary | ICD-10-CM | POA: Diagnosis not present

## 2022-02-04 DIAGNOSIS — I672 Cerebral atherosclerosis: Secondary | ICD-10-CM | POA: Diagnosis not present

## 2022-02-04 DIAGNOSIS — I639 Cerebral infarction, unspecified: Secondary | ICD-10-CM | POA: Diagnosis not present

## 2022-02-04 DIAGNOSIS — I6521 Occlusion and stenosis of right carotid artery: Secondary | ICD-10-CM | POA: Diagnosis not present

## 2022-02-05 DIAGNOSIS — I517 Cardiomegaly: Secondary | ICD-10-CM | POA: Diagnosis not present

## 2022-02-05 DIAGNOSIS — R2990 Unspecified symptoms and signs involving the nervous system: Secondary | ICD-10-CM | POA: Diagnosis not present

## 2022-02-09 ENCOUNTER — Other Ambulatory Visit (HOSPITAL_COMMUNITY): Payer: Self-pay

## 2022-02-10 ENCOUNTER — Other Ambulatory Visit (HOSPITAL_COMMUNITY): Payer: Self-pay

## 2022-02-10 ENCOUNTER — Other Ambulatory Visit: Payer: Self-pay | Admitting: Cardiovascular Disease

## 2022-02-17 DIAGNOSIS — R200 Anesthesia of skin: Secondary | ICD-10-CM | POA: Diagnosis not present

## 2022-02-17 DIAGNOSIS — S0990XA Unspecified injury of head, initial encounter: Secondary | ICD-10-CM | POA: Diagnosis not present

## 2022-02-17 DIAGNOSIS — Z885 Allergy status to narcotic agent status: Secondary | ICD-10-CM | POA: Diagnosis not present

## 2022-02-17 DIAGNOSIS — Z881 Allergy status to other antibiotic agents status: Secondary | ICD-10-CM | POA: Diagnosis not present

## 2022-02-17 DIAGNOSIS — Z886 Allergy status to analgesic agent status: Secondary | ICD-10-CM | POA: Diagnosis not present

## 2022-02-17 DIAGNOSIS — R519 Headache, unspecified: Secondary | ICD-10-CM | POA: Diagnosis not present

## 2022-02-17 DIAGNOSIS — Z882 Allergy status to sulfonamides status: Secondary | ICD-10-CM | POA: Diagnosis not present

## 2022-02-17 DIAGNOSIS — Z888 Allergy status to other drugs, medicaments and biological substances status: Secondary | ICD-10-CM | POA: Diagnosis not present

## 2022-02-17 DIAGNOSIS — R413 Other amnesia: Secondary | ICD-10-CM | POA: Diagnosis not present

## 2022-02-17 DIAGNOSIS — I447 Left bundle-branch block, unspecified: Secondary | ICD-10-CM | POA: Diagnosis not present

## 2022-02-17 DIAGNOSIS — F419 Anxiety disorder, unspecified: Secondary | ICD-10-CM | POA: Diagnosis not present

## 2022-02-17 DIAGNOSIS — R251 Tremor, unspecified: Secondary | ICD-10-CM | POA: Diagnosis not present

## 2022-02-17 DIAGNOSIS — Z7901 Long term (current) use of anticoagulants: Secondary | ICD-10-CM | POA: Diagnosis not present

## 2022-02-17 DIAGNOSIS — R202 Paresthesia of skin: Secondary | ICD-10-CM | POA: Diagnosis not present

## 2022-02-19 NOTE — Progress Notes (Signed)
Cardiology Clinic Note   Patient Name: Laura [name] Date of Encounter: 02/20/2022  Primary Care Provider:  Shirline Frees, MD Primary Cardiologist:  Shelva Majestic, MD  Patient Profile     76 year old woman with a history of Takotsubo's, MI, left bundle branch block, ischemic cardiomyopathy, hypertension, hyperlipidemia, hyperglycemia, hypothyroidism,Stage 1a adenocarcinoma of the left lung, s/p lobectomy, PE in the right middle lobe per CT 01/13/2022, OSA followed by Dr. Claiborne Billings, with inconsistent use of CPAP, finally stopping altogether in 2020, obesity, anxiety, depression, asthma, and memory loss.   Past Medical History    Past Medical History:  Diagnosis Date   Anemia    Anxiety    Asthma    related to sesonal allergies   Cancer (HCC)    Chronic combined systolic and diastolic CHF, NYHA class 2 ([place]) CARDIOLOGIST-  DR [id number]   Coronary artery disease    Depression    History of kidney stones    History of non-ST elevation myocardial infarction (NSTEMI) 11/27/2009   SECONDARY TO TAKOTSUDO SYNDROME (CARDIAC CATH NORMAL)   Hyperlipemia    Hypertension    Hypoglycemia    Hypothyroidism    LBBB (left bundle branch block)    Left ventricular ejection fraction less than 40%    38% PER CARDIOLOGIST NOTE (DR CROITORU)   Memory loss    Mood swings    Myocardial infarction ([place]) 2011   Nonischemic dilated cardiomyopathy ([place])    MODERATELY DEPRESSED LVF;EF 35-45% by Echo 05/27/11   OSA (obstructive sleep apnea) MODERATE PER STUDY 2005   CPAP NONCOMPLIANT   Pre-diabetes    Seasonal allergies    SUI (stress urinary incontinence, female)    Past Surgical History:  Procedure Laterality Date   ABDOMINAL HYSTERECTOMY  06/30/1983   partial   CARDIAC CATHETERIZATION  09-04-1999;  08/25/2004;   12/09/2009  DR CROITORU   NORMAL CORONARIES/  APICAL BALLOONING OF LV CONSISTENT WITH TAKOTSUBO SYMPTOMS/ EF 30-35%   CATARACT EXTRACTION W/ INTRAOCULAR LENS  IMPLANT, BILATERAL      CHOLECYSTECTOMY N/A 09/29/2012   Procedure: LAPAROSCOPIC CHOLECYSTECTOMY WITH INTRAOPERATIVE CHOLANGIOGRAM;  Surgeon: Adin Hector, MD;  Location: [place];  Service: General;  Laterality: N/A;   COLONOSCOPY  07/2020   CYSTOSCOPY N/A 09/19/2012   Procedure: Erlene Quan;  Surgeon: Bernestine Amass, MD;  Location: [facility];  Service: Urology;  Laterality: N/A;   DILATION AND CURETTAGE OF UTERUS     EYE SURGERY Bilateral    cataract removal   INTERCOSTAL NERVE BLOCK  05/30/2021   Procedure: INTERCOSTAL NERVE BLOCK;  Surgeon: Melrose Nakayama, MD;  Location: [place];  Service: Thoracic;;   KNEE ARTHROSCOPY W/ MENISCECTOMY  07/27/2011   MEDIAL AND LATERAL   LOBECTOMY  05/30/2021   Procedure: LEFT LOWER LOBECTOMY;  Surgeon: Melrose Nakayama, MD;  Location: [place];  Service: Thoracic;;   LYMPH NODE DISSECTION  05/30/2021   Procedure: LYMPH NODE DISSECTION;  Surgeon: Melrose Nakayama, MD;  Location: [place];  Service: Thoracic;;   NASAL SEPTUM SURGERY  02/28/1979   PUBOVAGINAL SLING N/A 09/19/2012   Procedure: SUBURETHRAL Janyth Pupa;  Surgeon: Bernestine Amass, MD;  Location: [facility];  Service: Urology;  Laterality: N/A;   RIGHT URETEROSCOPIC STONE EXTRACTION  08/31/2000   TRANSTHORACIC ECHOCARDIOGRAM  05-27-2011  DR CROITORU   MODERATELY DEPRESSED LVF DUE TO GLOBAL HYPOKINESIS AND MARKED SYSTOLIC ASYNCHRONY/ EF 63%/ MILD LEFT ATRIAL DILATATION    Allergies  Allergies  Allergen Reactions  Codeine Anaphylaxis, Hives and Other (See Comments)    Headache. Daughter reported that it caused her throat to swell up    Ciprofloxacin Nausea And Vomiting   Ace Inhibitors Other (See Comments)    Unknown- it "didn't agree with her"    Citalopram Other (See Comments)    Fatigue   Fetzima [Levomilnacipran] Other (See Comments)    "Talking out of my head"   Lasix [Furosemide] Other (See Comments)    HEADACHE   Nsaids Other (See Comments)    Told not  to take NSAIDs because of her heart    Sulfamethoxazole-Trimethoprim Other (See Comments)    NERVOUS AND DISORENTED   Xanax Starla Link Er] Other (See Comments)    confusion    History of Present Illness    Laura [name] is an anxious 76 year old female we are following for ongoing assessment and management of nonischemic cardiomyopathy with Takotsubo syndrome.  She is being seen post ED visit after anxiety attack causing tingling in her face and arms on 02/04/2022 and was ruled out for CVA.  She was seen again in the ED on 02/17/2022 for anxiety induced symptoms.  She comes today with a lengthy list of her past medical history and symptoms to include IBS, adenocarcinoma of the lung, and PE.  She is having trouble finding a GI specialist.  She states that "they will not see me" and is asking for a referral.  She was also to have a follow-up neurology visit.  She has not seen her PCP for referrals.  She has multiple somatic complaints.  She denies any melena, hemoptysis, but is unhappy with bleeding after having blood draws as she is it takes longer for her to stop bleeding.  Most recent blood draw had significant bleeding requiring a compression dressing.  Home Medications    Current Outpatient Medications  Medication Sig Dispense Refill   apixaban (ELIQUIS) 5 MG TABS tablet Take 1 tablet (5 mg total) by mouth 2 (two) times daily. 60 tablet 3   atorvastatin (LIPITOR) 80 MG tablet TAKE 1/2 TABLET BY MOUTH DAILY FOR CHOLESTEROL 45 tablet 3   clonazePAM (KLONOPIN) 0.5 MG tablet Take by mouth.     diazepam (VALIUM) 5 MG tablet Take 1 tablet (5 mg total) by mouth every 12 (twelve) hours as needed for muscle spasms. (Patient taking differently: Take 10 mg by mouth every 12 (twelve) hours as needed for muscle spasms. Take 10 mg by mouth) 10 tablet 0   hydrALAZINE (APRESOLINE) 25 MG tablet Take 1 tablet (25 mg total) by mouth 3 (three) times daily. KEEP UPCOMING APPOINTMENT FOR FUTURE REFILLS. 90  tablet 0   [START ON 03/11/2022] HYDROcodone-acetaminophen (NORCO/VICODIN) 5-325 MG tablet Take by mouth.     irbesartan (AVAPRO) 300 MG tablet Take 300 mg by mouth daily.     levothyroxine (SYNTHROID, LEVOTHROID) 88 MCG tablet Take 88 mcg by mouth daily before breakfast.     Melatonin 10 MG TABS Take 5 mg by mouth at bedtime as needed.     metoprolol succinate (TOPROL-XL) 50 MG 24 hr tablet TAKE ONE AND ONE-HALF TABLETS BY MOUTH DAILY 90 tablet 3   NUVIGIL 250 MG tablet Take 125 mg by mouth daily as needed (energy boost).  3   No current facility-administered medications for this visit.     Family History    Family History  Problem Relation Age of Onset   Pneumonia Mother    Hypertension Mother    Heart attack Father  Heart attack Paternal Grandfather    Fibromyalgia Brother    Pulmonary embolism Brother    Hypertension Brother    Heart disease Brother    Breast cancer Neg Hx    She indicated that her mother is deceased. She indicated that her father is deceased. She indicated that her sister is alive. She indicated that her brother is alive. She indicated that the status of her paternal grandfather is unknown. She indicated that the status of her neg hx is unknown.  Social History    Social History   Socioeconomic History   Marital status: Married    Spouse name: Richard   Number of children: 2   Years of education: 12+   Highest education level: Some college, no degree  Occupational History   Occupation: Retired  Tobacco Use   Smoking status: Never   Smokeless tobacco: Never  Vaping Use   Vaping Use: Never used  Substance and Sexual Activity   Alcohol use: No    Alcohol/week: 0.0 standard drinks of alcohol   Drug use: No   Sexual activity: Not Currently    Birth control/protection: Post-menopausal  Other Topics Concern   Not on file  Social History Narrative   Lives at home with husband.   Right-handed.   Drinks 2-3 cups caffeine per day.   No regular  exercise.   Social Determinants of Health   Financial Resource Strain: Not on file  Food Insecurity: Not on file  Transportation Needs: Not on file  Physical Activity: Not on file  Stress: Not on file  Social Connections: Not on file  Intimate Partner Violence: Not on file     Review of Systems    General:  No chills, fever, night sweats or weight changes.  Cardiovascular: Positive for chest pain, dyspnea on exertion, edema, orthopnea, palpitations, paroxysmal nocturnal dyspnea. Dermatological: No rash, lesions/masses Respiratory: No cough, dyspnea Urologic: No hematuria, dysuria Abdominal:   No nausea, vomiting, diarrhea, bright red blood per rectum, melena, or hematemesis.  Positive for chronic constipation requiring multiple enemas. Neurologic:  No visual changes, wkns, changes in mental status.  Positive for numbness and tingling in her feet hands and face All other systems reviewed and are otherwise negative except as noted above.    Physical Exam    VS:  BP 130/70   Pulse 80   Ht 5\' 3"  (1.6 m)   Wt 154 lb 9.6 oz (70.1 kg)   SpO2 97%   BMI 27.39 kg/m  , BMI Body mass index is 27.39 kg/m.     GEN: Well nourished, well developed, in no acute distress. HEENT: normal. Neck: Supple, no JVD, carotid bruits, or masses. Cardiac: RRR, no murmurs, rubs, or gallops. No clubbing, cyanosis, edema.  Radials/DP/PT 2+ and equal bilaterally.  Respiratory:  Respirations regular and unlabored, clear to auscultation bilaterally. GI: Soft, nontender, nondistended, BS + x 4. MS: no deformity or atrophy. Skin: warm and dry, no rash. Neuro:  Strength and sensation are intact. Psych: Normal affect, anxious at times.  Accessory Clinical Findings    ECG personally reviewed by me today-not completed this office visit.  She has had frequent EKGs and ED visits most recently 02/17/2022.  Chronic left bundle branch block.  Lab Results  Component Value Date   WBC 8.2 01/13/2022   HGB 12.6  01/13/2022   HCT 39.1 01/13/2022   MCV 81.8 01/13/2022   PLT 250 01/13/2022   Lab Results  Component Value Date   CREATININE 0.93 01/13/2022  BUN 14 01/13/2022   NA 140 01/13/2022   K 5.1 01/13/2022   CL 106 01/13/2022   CO2 32 01/13/2022   Lab Results  Component Value Date   ALT 12 01/13/2022   AST 14 (L) 01/13/2022   ALKPHOS 64 01/13/2022   BILITOT 0.4 01/13/2022   Lab Results  Component Value Date   CHOL 158 12/19/2018   HDL 57 12/19/2018   LDLCALC 78 12/19/2018   TRIG 113 12/19/2018   CHOLHDL 2.8 12/19/2018    Lab Results  Component Value Date   HGBA1C 6.0 (H) 12/19/2018    Review of Prior Studies: Echocardiogram 06/24/2020  1. Left ventricular ejection fraction, by estimation, is 40 to 45%. The  left ventricle has mildly decreased function. The left ventricle  demonstrates regional wall motion abnormalities. Abnormal (paradoxical)  septal motion, consistent with left bundle  branch block. The left ventricular internal cavity size was mildly  dilated. There is mild left ventricular hypertrophy. Left ventricular  diastolic parameters are consistent with Grade I diastolic dysfunction  (impaired relaxation).   2. Right ventricular systolic function is mildly reduced. The right  ventricular size is normal. Tricuspid regurgitation signal is inadequate  for assessing PA pressure.   3. The mitral valve is normal in structure. Trivial mitral valve  regurgitation.   4. The aortic valve was not well visualized. Aortic valve regurgitation  is trivial. No aortic stenosis is present.   5. The inferior vena cava is normal in size with greater than 50%  respiratory variability, suggesting right atrial pressure of 3 mmHg.    ECHO Doppler study was in August 2018 which showed moderate concentric LVH with abnormal septal wall motion due to left bundle branch block.  EF 50%.  Moderate LA dilation.  Mild to moderate MR.   Carotid duplex imaging from April 01, 2017: 1 to 39%  right and left proximal internal carotid stenoses   Lexiscan Myoview studies March 25, 2017: Normal perfusion with EF at 43% with abnormal septal motion consistent with left bundle branch block.  The visual EF appeared higher.  It was felt that a low EF was affected by the bundle branch block and  gating artifact due to PVCs.     LONG TERM MONITOR 01/19/2020 The patient was monitored from June 28 through January 08, 2019 for 13 days and 12 hours.  The predominant rhythm was sinus rhythm with an average heart rate of 71 bpm.  There was bundle branch block/I VCD.  There were frequent isolated PVCs at 9.5%, occasional couplet at 1.5% and rare triplets less than 1%.   There were rare episodes of ventricular bigeminy and trigeminy.  There was 1 run of irregular ventricular tachycardia lasting 4 beats with an average rate of 128 with a maximum rate at 174.  There were 2 supraventricular tachycardic episodes with the fastest interval 14 beats (average rate 120  Assessment & Plan   1.  Recurrent chest pain: Most recent nuclear medicine study was in 2018.  She continues to have recurrent chest discomfort which probably is related to Takotsubo's.  She is insistent on having a repeat stress test.  Cardiovascular risk factors of hyperlipidemia, hypertension, thyroid disease.  Lexiscan Myoview was ordered.  She will follow-up with Dr. Claiborne Billings as she only wants to be seen by him.  2.  Hypertension: Remains on metoprolol, irbesartan, hydralazine.  Despite anxiety, blood pressure is well controlled.  No changes at this time.  3.  Nonischemic cardiomyopathy: Most recent echocardiogram revealed  an EF of 45% with grade 1 diastolic dysfunction.  No evidence of volume overload today.  No changes in medical regimen.  4.  IBS: Being referred to GI.  She seems resistant to go to [facility] as she states "they will not see me" however there is no indication in the notes that there is been a refusal to see her.  5.  RLS: Advised  to take magnesium 200 mg over-the-counter as needed leg cramps.  Can take it dail daily if this is a recurrent problem.  She is going to follow-up with neurology.  Current medicines are reviewed at length with the patient today.  I have spent 45 min's  dedicated to the care of this patient on the date of this encounter to include pre-visit review of records, assessment, management and diagnostic testing,with shared decision making. Signed, Phill Myron. West Pugh, ANP, [place]   02/20/2022 1:09 PM      Office [phone] Fax [phone]  Notice: This dictation was prepared with Dragon dictation along with smaller phrase technology. Any transcriptional errors that result from this process are unintentional and may not be corrected upon review.

## 2022-02-20 ENCOUNTER — Encounter: Payer: Self-pay | Admitting: Adult Health

## 2022-02-20 ENCOUNTER — Ambulatory Visit (INDEPENDENT_AMBULATORY_CARE_PROVIDER_SITE_OTHER): Payer: Medicare Other | Admitting: Adult Health

## 2022-02-20 ENCOUNTER — Telehealth: Payer: Self-pay

## 2022-02-20 VITALS — BP 130/70 | HR 80 | Ht 63.0 in | Wt 154.6 lb

## 2022-02-20 DIAGNOSIS — R079 Chest pain, unspecified: Secondary | ICD-10-CM

## 2022-02-20 DIAGNOSIS — I10 Essential (primary) hypertension: Secondary | ICD-10-CM | POA: Diagnosis not present

## 2022-02-20 DIAGNOSIS — I5181 Takotsubo syndrome: Secondary | ICD-10-CM | POA: Diagnosis not present

## 2022-02-20 DIAGNOSIS — I43 Cardiomyopathy in diseases classified elsewhere: Secondary | ICD-10-CM | POA: Diagnosis not present

## 2022-02-20 DIAGNOSIS — K589 Irritable bowel syndrome without diarrhea: Secondary | ICD-10-CM | POA: Diagnosis not present

## 2022-02-20 DIAGNOSIS — C3432 Malignant neoplasm of lower lobe, left bronchus or lung: Secondary | ICD-10-CM | POA: Diagnosis not present

## 2022-02-20 DIAGNOSIS — G629 Polyneuropathy, unspecified: Secondary | ICD-10-CM | POA: Diagnosis not present

## 2022-02-20 DIAGNOSIS — F411 Generalized anxiety disorder: Secondary | ICD-10-CM | POA: Diagnosis not present

## 2022-02-20 DIAGNOSIS — I447 Left bundle-branch block, unspecified: Secondary | ICD-10-CM

## 2022-02-20 NOTE — Telephone Encounter (Signed)
Unable to reach patient. LMTRC  

## 2022-02-20 NOTE — Patient Instructions (Addendum)
Medication Instructions:  Your physician recommends that you continue on your current medications as directed. Please refer to the Current Medication list given to you today.  *If you need a refill on your cardiac medications before your next appointment, please call your pharmacy*   Lab Work: NONE If you have labs (blood work) drawn today and your tests are completely normal, you will receive your results only by: [place] (if you have MyChart) OR A paper copy in the mail If you have any lab test that is abnormal or we need to change your treatment, we will call you to review the results.   Testing/Procedures: Your physician has requested that you have a lexiscan myoview. For further information please visit [email or web address]. Please follow instruction sheet, as given.    Follow-Up: At [facility], you and your health needs are our priority.  As part of our continuing mission to provide you with exceptional heart care, we have created designated Provider Care Teams.  These Care Teams include your primary Cardiologist (physician) and Advanced Practice Providers (APPs -  Physician Assistants and Nurse Practitioners) who all work together to provide you with the care you need, when you need it.  We recommend signing up for the patient portal called "MyChart".  Sign up information is provided on this After Visit Summary.  MyChart is used to connect with patients for Virtual Visits (Telemedicine).  Patients are able to view lab/test results, encounter notes, upcoming appointments, etc.  Non-urgent messages can be sent to your provider as well.   To learn more about what you can do with MyChart, go to [email or web address].    Your next appointment:   6 week(s)  The format for your next appointment:   In Person  Provider:   Shelva Majestic, MD  How to Prepare for Your Myoview Test (stress test):  1. Please do not take these medications before your test: You may take all of  your current medications.  2. Your remaining medications may be taken with water. 3. Nothing to eat or drink, except water, 4 hours prior to arrival time. NO caffeine/decaffeinated products, or chocolate 12 hours prior to arrival. 4. Ladies, please do not wear dresses.  Skirts or pants are approprate, please wear a short sleeve shirt. 5. NO perfume, cologne or lotion 6. Wear comfortable walking shoes.  NO HEELS! 7. Total time is 3 to 4 hours; you may want to bring reading material for the waiting time. 8. Please report to [facility] for your test  What to expect after you arrive:  Once you arrive and check in for your appointment an IV will be started in your arm.  Then the Technoligist will inject a small amount of radioactive tracer.  There will be a 1 hour waiting period after this injection.  A series of pictures will be taken of your heart following this waiting period.  You will be prepped for the stress portion of the test.  During the stress portion of your test you will either walk on a treadmill or receive a small, safe amount of radioactive tracer injected in your IV.  After the stress portion, there is a short rest period during which time your heart and blood pressure will be monitored.  After the short rest period the Technologist will begin your second set of pictures.  Your doctor will inform you of your test results within 7-10 business days.  In preparation for your appointment, medication and supplies will be purchased.  Appointment  availability is limited, so if you need to cancel or reschedule please call the office at [phone] 24 hours in advance to avoid a cancellation fee of $100.00

## 2022-02-23 ENCOUNTER — Telehealth: Payer: Self-pay

## 2022-02-23 ENCOUNTER — Telehealth: Payer: Self-pay | Admitting: Cardiovascular Disease

## 2022-02-23 DIAGNOSIS — K5900 Constipation, unspecified: Secondary | ICD-10-CM | POA: Diagnosis not present

## 2022-02-23 DIAGNOSIS — R140 Abdominal distension (gaseous): Secondary | ICD-10-CM | POA: Diagnosis not present

## 2022-02-23 NOTE — Telephone Encounter (Signed)
Spoke with the patient in regards to the cramps she's been having in her legs and the pickle juice she's been drinking every night for the cramps. I advised her that Curt Bears does not want her drinking the pickle juice every night due to the amount of salt that's in the pickle juice, But she does want Mrs. Benge taking 200mg  of OTC potassium daily at night for the cramps. The patient gave a verbal understanding.

## 2022-02-23 NOTE — Telephone Encounter (Signed)
Pt returning CMA's call from 02/20/22. Please advise

## 2022-02-23 NOTE — Telephone Encounter (Signed)
Returned patient's call, Unable to reach her [facility].

## 2022-02-23 NOTE — Telephone Encounter (Signed)
Spoke with the patient. Telephone call has been documented for this conversation.

## 2022-02-27 ENCOUNTER — Ambulatory Visit (HOSPITAL_COMMUNITY)
Admission: RE | Admit: 2022-02-27 | Discharge: 2022-02-27 | Disposition: A | Discharge status: Home / Self Care | Payer: Medicare Other | Source: Ambulatory Visit | Attending: Cardiology | Admitting: Cardiology

## 2022-02-27 DIAGNOSIS — R079 Chest pain, unspecified: Secondary | ICD-10-CM | POA: Insufficient documentation

## 2022-02-27 LAB — MYOCARDIAL PERFUSION IMAGING
Peak HR: 111 {beats}/min
Rest HR: 65 {beats}/min
Rest Nuclear Isotope Dose: 11 mCi
SDS: 1
SRS: 3
SSS: 4
ST Depression (mm): 0 mm
Stress Nuclear Isotope Dose: 30.7 mCi
TID: 0.77

## 2022-02-27 MED ORDER — TECHNETIUM TC 99M TETROFOSMIN IV KIT
11.0000 | PACK | Freq: Once | INTRAVENOUS | Status: AC | PRN
  Administered 2022-02-27: 11 via INTRAVENOUS

## 2022-02-27 MED ORDER — REGADENOSON 0.4 MG/5ML IV SOLN
0.4000 mg | Freq: Once | INTRAVENOUS | Status: AC
  Administered 2022-02-27: 0.4 mg via INTRAVENOUS

## 2022-02-27 MED ORDER — TECHNETIUM TC 99M TETROFOSMIN IV KIT
30.7000 | PACK | Freq: Once | INTRAVENOUS | Status: AC | PRN
  Administered 2022-02-27: 30.7 via INTRAVENOUS

## 2022-03-03 ENCOUNTER — Telehealth: Payer: Self-pay

## 2022-03-03 DIAGNOSIS — Z85118 Personal history of other malignant neoplasm of bronchus and lung: Secondary | ICD-10-CM | POA: Diagnosis not present

## 2022-03-03 DIAGNOSIS — M62830 Muscle spasm of back: Secondary | ICD-10-CM | POA: Diagnosis not present

## 2022-03-03 DIAGNOSIS — E039 Hypothyroidism, unspecified: Secondary | ICD-10-CM | POA: Diagnosis not present

## 2022-03-03 DIAGNOSIS — R7303 Prediabetes: Secondary | ICD-10-CM | POA: Diagnosis not present

## 2022-03-03 DIAGNOSIS — E7800 Pure hypercholesterolemia, unspecified: Secondary | ICD-10-CM | POA: Diagnosis not present

## 2022-03-03 DIAGNOSIS — I10 Essential (primary) hypertension: Secondary | ICD-10-CM | POA: Diagnosis not present

## 2022-03-03 DIAGNOSIS — Z86711 Personal history of pulmonary embolism: Secondary | ICD-10-CM | POA: Diagnosis not present

## 2022-03-03 NOTE — Telephone Encounter (Addendum)
Called patient regarding results. Patient had understanding of results.----- Message from Lendon Colonel, NP sent at 02/27/2022  4:41 PM EDT ----- I have reviewed the NM stress test. Low risk without evidence of lack of flow to heart muscle. This is a good result,   KL

## 2022-03-04 DIAGNOSIS — R1084 Generalized abdominal pain: Secondary | ICD-10-CM | POA: Diagnosis not present

## 2022-03-04 DIAGNOSIS — K5900 Constipation, unspecified: Secondary | ICD-10-CM | POA: Diagnosis not present

## 2022-03-06 DIAGNOSIS — I447 Left bundle-branch block, unspecified: Secondary | ICD-10-CM | POA: Diagnosis not present

## 2022-03-06 DIAGNOSIS — R4182 Altered mental status, unspecified: Secondary | ICD-10-CM | POA: Diagnosis not present

## 2022-03-06 DIAGNOSIS — R1030 Lower abdominal pain, unspecified: Secondary | ICD-10-CM | POA: Diagnosis not present

## 2022-03-06 DIAGNOSIS — R1031 Right lower quadrant pain: Secondary | ICD-10-CM | POA: Diagnosis not present

## 2022-03-06 DIAGNOSIS — Z90710 Acquired absence of both cervix and uterus: Secondary | ICD-10-CM | POA: Diagnosis not present

## 2022-03-06 DIAGNOSIS — R42 Dizziness and giddiness: Secondary | ICD-10-CM | POA: Diagnosis not present

## 2022-03-06 DIAGNOSIS — Z9049 Acquired absence of other specified parts of digestive tract: Secondary | ICD-10-CM | POA: Diagnosis not present

## 2022-03-06 DIAGNOSIS — R29818 Other symptoms and signs involving the nervous system: Secondary | ICD-10-CM | POA: Diagnosis not present

## 2022-03-06 DIAGNOSIS — R1032 Left lower quadrant pain: Secondary | ICD-10-CM | POA: Diagnosis not present

## 2022-03-09 ENCOUNTER — Telehealth: Payer: Self-pay | Admitting: *Deleted

## 2022-03-09 NOTE — Chronic Care Management (AMB) (Unsigned)
  Care Coordination  Outreach Note  03/09/2022 Name: RASHANA ANDREW MRN: [id number] DOB: 11-17-1945   Care Coordination Outreach Attempts: An unsuccessful telephone outreach was attempted today to offer the patient information about available care coordination services as a benefit of their health plan.   Follow Up Plan:  No further outreach attempts will be made at this time. We have been unable to contact the patient to offer or enroll patient in care coordination services  Encounter Outcome:  No Answer   [place]: [phone]

## 2022-03-10 ENCOUNTER — Other Ambulatory Visit (HOSPITAL_COMMUNITY): Payer: Self-pay

## 2022-03-10 NOTE — Chronic Care Management (AMB) (Signed)
  Care Coordination   Note   03/10/2022 Name: Laura [name] MRN: [id number] DOB: [birth year]  Laura [name] is a 76 y.o. year old female who sees Shirline Frees, MD for primary care. I reached out to Merrilee Seashore by phone today to offer care coordination services.  [name] was given information about Care Coordination services today including:   The Care Coordination services include support from the care team which includes your Nurse Coordinator, Clinical Social Worker, or Pharmacist.  The Care Coordination team is here to help remove barriers to the health concerns and goals most important to you. Care Coordination services are voluntary, and the patient may decline or stop services at any time by request to their care team member.   Care Coordination Consent Status: Patient did not agree to participate in care coordination services at this time.    Encounter Outcome:  Pt. Refused  [place]  Direct Dial: [phone]

## 2022-03-16 DIAGNOSIS — N9989 Other postprocedural complications and disorders of genitourinary system: Secondary | ICD-10-CM | POA: Diagnosis not present

## 2022-03-16 DIAGNOSIS — K5900 Constipation, unspecified: Secondary | ICD-10-CM | POA: Diagnosis not present

## 2022-03-16 DIAGNOSIS — N393 Stress incontinence (female) (male): Secondary | ICD-10-CM | POA: Diagnosis not present

## 2022-03-16 DIAGNOSIS — N3946 Mixed incontinence: Secondary | ICD-10-CM | POA: Diagnosis not present

## 2022-03-16 DIAGNOSIS — M6289 Other specified disorders of muscle: Secondary | ICD-10-CM | POA: Diagnosis not present

## 2022-03-26 DIAGNOSIS — K5902 Outlet dysfunction constipation: Secondary | ICD-10-CM | POA: Diagnosis not present

## 2022-03-26 DIAGNOSIS — N816 Rectocele: Secondary | ICD-10-CM | POA: Diagnosis not present

## 2022-03-26 DIAGNOSIS — N393 Stress incontinence (female) (male): Secondary | ICD-10-CM | POA: Diagnosis not present

## 2022-03-26 DIAGNOSIS — Z4689 Encounter for fitting and adjustment of other specified devices: Secondary | ICD-10-CM | POA: Diagnosis not present

## 2022-03-26 DIAGNOSIS — N8111 Cystocele, midline: Secondary | ICD-10-CM | POA: Diagnosis not present

## 2022-04-06 DIAGNOSIS — M4316 Spondylolisthesis, lumbar region: Secondary | ICD-10-CM | POA: Diagnosis not present

## 2022-04-06 DIAGNOSIS — M25561 Pain in right knee: Secondary | ICD-10-CM | POA: Diagnosis not present

## 2022-04-06 DIAGNOSIS — M1711 Unilateral primary osteoarthritis, right knee: Secondary | ICD-10-CM | POA: Diagnosis not present

## 2022-04-06 DIAGNOSIS — M47816 Spondylosis without myelopathy or radiculopathy, lumbar region: Secondary | ICD-10-CM | POA: Diagnosis not present

## 2022-04-06 DIAGNOSIS — M5459 Other low back pain: Secondary | ICD-10-CM | POA: Diagnosis not present

## 2022-04-06 DIAGNOSIS — M419 Scoliosis, unspecified: Secondary | ICD-10-CM | POA: Diagnosis not present

## 2022-04-06 DIAGNOSIS — M5136 Other intervertebral disc degeneration, lumbar region: Secondary | ICD-10-CM | POA: Diagnosis not present

## 2022-04-06 DIAGNOSIS — G8929 Other chronic pain: Secondary | ICD-10-CM | POA: Diagnosis not present

## 2022-04-07 DIAGNOSIS — M1711 Unilateral primary osteoarthritis, right knee: Secondary | ICD-10-CM | POA: Diagnosis not present

## 2022-04-09 ENCOUNTER — Ambulatory Visit: Payer: Medicare Other | Attending: Cardiovascular Disease | Admitting: Cardiovascular Disease

## 2022-04-09 ENCOUNTER — Encounter: Payer: Self-pay | Admitting: Cardiovascular Disease

## 2022-04-09 VITALS — BP 147/72 | HR 67 | Ht 63.5 in | Wt 160.0 lb

## 2022-04-09 DIAGNOSIS — I10 Essential (primary) hypertension: Secondary | ICD-10-CM

## 2022-04-09 DIAGNOSIS — Z7901 Long term (current) use of anticoagulants: Secondary | ICD-10-CM | POA: Diagnosis not present

## 2022-04-09 DIAGNOSIS — I2699 Other pulmonary embolism without acute cor pulmonale: Secondary | ICD-10-CM | POA: Diagnosis not present

## 2022-04-09 DIAGNOSIS — I428 Other cardiomyopathies: Secondary | ICD-10-CM | POA: Diagnosis not present

## 2022-04-09 DIAGNOSIS — G4733 Obstructive sleep apnea (adult) (pediatric): Secondary | ICD-10-CM | POA: Insufficient documentation

## 2022-04-09 DIAGNOSIS — E785 Hyperlipidemia, unspecified: Secondary | ICD-10-CM | POA: Diagnosis not present

## 2022-04-09 DIAGNOSIS — C3432 Malignant neoplasm of lower lobe, left bronchus or lung: Secondary | ICD-10-CM | POA: Diagnosis not present

## 2022-04-09 DIAGNOSIS — I447 Left bundle-branch block, unspecified: Secondary | ICD-10-CM | POA: Diagnosis not present

## 2022-04-09 DIAGNOSIS — I700 Atherosclerosis of aorta: Secondary | ICD-10-CM | POA: Diagnosis not present

## 2022-04-09 MED ORDER — HYDRALAZINE HCL 50 MG PO TABS: 50.0000 mg | ORAL_TABLET | Freq: Two times a day (BID) | ORAL | 3 refills | Status: DC

## 2022-04-09 NOTE — Progress Notes (Signed)
Cardiology Office Note    Date:  04/11/2022   ID:  RABECCA BIRGE, DOB 09/30/1945, MRN [id number]  PCP:  Shirline Frees, MD  Cardiologist:  Shelva Majestic, MD   15 month F/U evaluation  History of Present Illness:  LANIJAH WARZECHA is a 76 y.o. female who presents for a 15 month follow-up evaluation.    Ms. Deronda Christian is a remote patient of Dr. Sallyanne Kuster.  Apparently, she has a history of left bundle branch block, and apparently was felt to have a Takotsubo cardiomyopathy after she presented with a non-ST segment delegation MI with a peak troponin of 5.33.  She underwent cardiac catheterization by Dr. Sallyanne Kuster on December 09, 2009 and was found to have normal coronary arteries and classic findings of Takotsubo cardiomyopathy with apical ballooning of the left ventricle.  Her EF by echo was 30 to 35%.  At that time she was started on lisinopril, Lasix and low-dose metoprolol.  She also has a history of asthma and bronchitis.  Subsequently, she was followed by Dr. Tollie Eth for cardiology care.  She has a history of hypertension and has been on irbesartan 300 mg, hydralazine 50 mg twice a day, metoprolol succinate 12.5 mg daily.  She has a history of hypothyroidism and has been on levothyroxine 88 mcg.  There also was a history of some anxiety for which she had been given diazepam as well as citalopram.    I initially saw her in March 2020. Upon further questioning, the patient has a history of severe sleep apnea and states that she is had 3 sleep studies with her last being many years ago by Dr. Annamaria Boots.  She reports an AHI initially of 66.  She had been on CPAP therapy remotely remembers a pressure of 14 cm but then became not tolerant to CPAP mask.  As result she later obtained an oral appliance by Dr. Oneal Grout.  However she has had difficulty with some teeth fractures and and has not used her oral appliance in well over a year.  She admits to snoring.  Her sleep is nonrestorative.  She was  evaluated by Dr. Christinia Gully for cough bronchitis/asthma.  She was started on [facility] for asthma.  She was also recommended to take both pantoprazole and famotidine for GERD and its effect on her cough.    When I initially saw her in March 2020, I spent considerable time with her discussing her untreated sleep apnea and the significant improvements in mask and CPAP technology since her initial trial.  I recommended she undergo a follow-up echo Doppler study.  She was on atorvastatin for hyperlipidemia.  Due to the COVID pandemic, she never underwent a sleep study and upon further questioning today does not want to have one as long as she would have to have a COVID test.  She underwent an echo Doppler study on December 09, 2018 which revealed an EF of 45% with septal lateral dyssynchrony consistent with left bundle branch block.  There was mild aortic sclerosis without stenosis.  She was seen by Doreene Adas on January 31, 2019 and at that time was having palpitations.  She admitted to being under significant recent stress.  There was some discussion concerning EP evaluation of her PVCs but she declined.   She underwent a home sleep study on May 03, 2019 which showed moderate overall sleep apnea with an AHI of 26.9/h but since it was a home study the severity of sleep apnea during REM  sleep was unable to be assessed.  Of note was that she had severe oxygen desaturation to a nadir of 74%.  She snored 32.3% during sleep.  She was referred to undergo a CPAP titration study which was done on August 10, 2019.  Pressure was titrated to 11 cm with excellent result with O2 saturation 94%.  Apparently, she received a new CPAP machine with set up date September 25, 2019.  [employer] was her DME company.  Apparently, the patient never used the machine until May 25.  She had been contacted by the DME company of the importance of meeting compliance and that she will need to meet compliance by the 90-day window.  If she had continued  use consistently over the past month she would have met compliance but unfortunately usage was only 17 of 30 days.  At 11 cm, pressure was still elevated at 13.2.  Her machine was picked up by her DME company and she no longer has therapy.  She continues to notice occasional palpitations.  She denies chest pain or shortness of breath.  At her last evaluation with me in June 2021, I spent a significant amount of time with her.  She was considering the possibility of buying a CPAP machine on the Internet since her machine had been returned due to poor compliance.  In the past she had failed because demised oral appliance and had been treated by Dr. Ron Parker.  Her EKG showed some PVCs occurring frequently and I recommended further titration of metoprolol succinate to 50 mg daily.  She continues to have daytime sleepiness and was taken Nuvigil on a as needed basis.  I saw her in September 2021 at which time she continued to have poor sleep.  She was continued to have daytime sleepiness. She has done some research on her own and would like to be evaluated for inspire technology.  She has subsequently seen Dr. Redmond Baseman of ENT. She continues to experience some shortness of breath but this seems to occur when she does not eat and seems to improve when she does eat.  She is not very active.  Her last echo Doppler study in February 2020 showed an EF of 45% with septal lateral dyssynchrony consistent with her left bundle branch block.   When I last saw her in August 30, 2020 she was having issues with inflammation.  She has been evaluated by orthopedist.  She has had balance issues.  She denies chest pain.  She underwent a CT angio of her chest and abdomen due to significant discomfort in November 2021.  There was no evidence for thoracoabdominal aortic aneurysm or dissection.  There was no evidence for acute intramural hematoma within the thoracic aorta.  There were no acute findings in the chest abdomen or pelvis.  There was  evidence for left colonic diverticulosis without diverticulitis and she had aortic atherosclerosis.  During her evaluation, her blood pressure was elevated despite taking irbesartan 3 mg daily, metoprolol all succinate 50 mg apparently she had only been taking hydralazine 50 mg once a day.  She remotely had been on spironolactone but was no longer on this.  I discussed with her the importance of at least taking hydralazine twice a day.  I recommended further titration of metoprolol succinate to 75 mg.  She had seen Dr. Redmond Baseman in the past for possible consideration for inspire technology with her untreated sleep apnea.  She was taking nuigil rarely for daytime sleepiness.  I last saw her on  December 31, 2020.  She was having difficulty  getting the pill count right when she gets her prescription filled.  She does not like cutting hydralazine in half and has been taking 50 mg in the morning and 25 mg in the evening.  She has continued to be on irbesartan 300 mg a day in addition to metoprolol succinate 75 mg daily for hypertension.  She continues to be on atorvastatin 40 mg for hyperlipidemia.  She states her sleep has been poor mainly due to back issues which make it difficult for her to rest comfortably.  She also complains of some left eye issues and has seen several ophthalmologists.  She recently saw Dr. Roxan Hockey in August 2023.  She had undergone robotic assisted left lower lobectomy for stage Ia adenocarcinoma in December 2022.  She had experienced intercostal neuralgia pain and briefly was on gabapentin.  She saw Dr. Earlie Server in July 2023 and a CT of her chest did not show evidence for recurrent disease but she was noted to have a small PE for which she was started on Eliquis.   She has been having difficulty with her back and has been undergoing lumbar injections.  She also has had difficulty with her right knee and has been wearing a knee brace.  She states her blood pressure at home typically is in the  025 systolic.  She admits to significant fatigue and being tired.  She no longer has CPAP.  Remotely she did benefit from an oral appliance but stopped using it since she felt it may have caused some teeth to fracture.  She denies any chest pain.  She denies recent bleeding on Eliquis.  She presents for evaluation.  Past Medical History:  Diagnosis Date   Anemia    Anxiety    Asthma    related to sesonal allergies   Cancer (HCC)    Chronic combined systolic and diastolic CHF, NYHA class 2 ([place]) CARDIOLOGIST-  DR ENIDPOEU   Coronary artery disease    Depression    History of kidney stones    History of non-ST elevation myocardial infarction (NSTEMI) 11/27/2009   SECONDARY TO TAKOTSUDO SYNDROME (CARDIAC CATH NORMAL)   Hyperlipemia    Hypertension    Hypoglycemia    Hypothyroidism    LBBB (left bundle branch block)    Left ventricular ejection fraction less than 40%    38% PER CARDIOLOGIST NOTE (DR CROITORU)   Memory loss    Mood swings    Myocardial infarction ([place]) 2011   Nonischemic dilated cardiomyopathy ([place])    MODERATELY DEPRESSED LVF;EF 35-45% by Echo 05/27/11   OSA (obstructive sleep apnea) MODERATE PER STUDY 2005   CPAP NONCOMPLIANT   Pre-diabetes    Seasonal allergies    SUI (stress urinary incontinence, female)     Past Surgical History:  Procedure Laterality Date   ABDOMINAL HYSTERECTOMY  06/30/1983   partial   CARDIAC CATHETERIZATION  09-04-1999;  08/25/2004;   12/09/2009  DR CROITORU   NORMAL CORONARIES/  APICAL BALLOONING OF LV CONSISTENT WITH TAKOTSUBO SYMPTOMS/ EF 30-35%   CATARACT EXTRACTION W/ INTRAOCULAR LENS  IMPLANT, BILATERAL     CHOLECYSTECTOMY N/A 09/29/2012   Procedure: LAPAROSCOPIC CHOLECYSTECTOMY WITH INTRAOPERATIVE CHOLANGIOGRAM;  Surgeon: Adin Hector, MD;  Location: [place];  Service: General;  Laterality: N/A;   COLONOSCOPY  07/2020   CYSTOSCOPY N/A 09/19/2012   Procedure: Erlene Quan;  Surgeon: Bernestine Amass, MD;  Location: [facility];  Service: Urology;  Laterality: N/A;   DILATION AND CURETTAGE OF UTERUS     EYE SURGERY Bilateral    cataract removal   INTERCOSTAL NERVE BLOCK  05/30/2021   Procedure: INTERCOSTAL NERVE BLOCK;  Surgeon: Melrose Nakayama, MD;  Location: [place];  Service: Thoracic;;   KNEE ARTHROSCOPY W/ MENISCECTOMY  07/27/2011   MEDIAL AND LATERAL   LOBECTOMY  05/30/2021   Procedure: LEFT LOWER LOBECTOMY;  Surgeon: Melrose Nakayama, MD;  Location: [place];  Service: Thoracic;;   LYMPH NODE DISSECTION  05/30/2021   Procedure: LYMPH NODE DISSECTION;  Surgeon: Melrose Nakayama, MD;  Location: [place];  Service: Thoracic;;   NASAL SEPTUM SURGERY  02/28/1979   PUBOVAGINAL SLING N/A 09/19/2012   Procedure: SUBURETHRAL Janyth Pupa;  Surgeon: Bernestine Amass, MD;  Location[facility];  Service: Urology;  Laterality: N/A;   RIGHT URETEROSCOPIC STONE EXTRACTION  08/31/2000   TRANSTHORACIC ECHOCARDIOGRAM  05-27-2011  DR CROITORU   MODERATELY DEPRESSED LVF DUE TO GLOBAL HYPOKINESIS AND MARKED SYSTOLIC ASYNCHRONY/ EF 66%/ MILD LEFT ATRIAL DILATATION    Current Medications: Outpatient Medications Prior to Visit  Medication Sig Dispense Refill   apixaban (ELIQUIS) 5 MG TABS tablet Take 1 tablet (5 mg total) by mouth 2 (two) times daily. 60 tablet 3   atorvastatin (LIPITOR) 80 MG tablet TAKE 1/2 TABLET BY MOUTH DAILY FOR CHOLESTEROL 45 tablet 3   diazepam (VALIUM) 5 MG tablet Take 1 tablet (5 mg total) by mouth every 12 (twelve) hours as needed for muscle spasms. (Patient taking differently: Take 10 mg by mouth every 12 (twelve) hours as needed for muscle spasms. Take 10 mg by mouth) 10 tablet 0   HYDROcodone-acetaminophen (NORCO) 7.5-325 MG tablet Take 1 tablet by mouth daily in the afternoon.     irbesartan (AVAPRO) 300 MG tablet Take 300 mg by mouth daily.     levothyroxine (SYNTHROID, LEVOTHROID) 88 MCG tablet Take 88 mcg by mouth daily before breakfast.     Melatonin  10 MG TABS Take 5 mg by mouth at bedtime as needed.     metoprolol succinate (TOPROL-XL) 50 MG 24 hr tablet TAKE ONE AND ONE-HALF TABLETS BY MOUTH DAILY 90 tablet 3   hydrALAZINE (APRESOLINE) 25 MG tablet Take 1 tablet (25 mg total) by mouth 3 (three) times daily. KEEP UPCOMING APPOINTMENT FOR FUTURE REFILLS. 90 tablet 0   clonazePAM (KLONOPIN) 0.5 MG tablet Take by mouth. (Patient not taking: Reported on 04/09/2022)     HYDROcodone-acetaminophen (NORCO/VICODIN) 5-325 MG tablet Take by mouth. (Patient not taking: Reported on 04/09/2022)     NUVIGIL 250 MG tablet Take 125 mg by mouth daily as needed (energy boost). (Patient not taking: Reported on 04/09/2022)  3   No facility-administered medications prior to visit.     Allergies:   Codeine, Ciprofloxacin, Ace inhibitors, Citalopram, Fetzima [levomilnacipran], Lasix [furosemide], Nsaids, Sulfamethoxazole-trimethoprim, and Xanax xr [alprazolam er]   Social History   Socioeconomic History   Marital status: Married    Spouse name: Richard   Number of children: 2   Years of education: 12+   Highest education level: Some college, no degree  Occupational History   Occupation: Retired  Tobacco Use   Smoking status: Never   Smokeless tobacco: Never  Vaping Use   Vaping Use: Never used  Substance and Sexual Activity   Alcohol use: No    Alcohol/week: 0.0 standard drinks of alcohol   Drug use: No   Sexual activity: Not Currently    Birth  control/protection: Post-menopausal  Other Topics Concern   Not on file  Social History Narrative   Lives at home with husband.   Right-handed.   Drinks 2-3 cups caffeine per day.   No regular exercise.   Social Determinants of Health   Financial Resource Strain: Not on file  Food Insecurity: Not on file  Transportation Needs: Not on file  Physical Activity: Not on file  Stress: Not on file  Social Connections: Not on file     She is married for over 6 I have I years to my patient Mr. Tanica Gaige.  She has 2 children and 1 grandchild.  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  She completed 12th grade of education.  She does not exercise.  She does not drink alcohol.  Family History:  The patient's family history includes Fibromyalgia in her brother; Heart attack in her father and paternal grandfather; Heart disease in her brother; Hypertension in her brother and mother; Pneumonia in her mother; Pulmonary embolism in her brother.   Her mother died at age 63 secondary to anaphylaxis.  Her father died at age 44 with a heart attack.  A brother died at age 78 with heart problems.  She has a living sister age 40.  ROS General: Negative; No fevers, chills, or night sweats;  HEENT: Complains of left eye difficulty and has seen several ophthalmologist and was told that her vision is fine. No sinus congestion, difficulty swallowing Pulmonary: Positive for cough, asthma and bronchitis; recent small PE, left lower lobectomy for T1, N0, stage Ia adenocarcinoma Cardiovascular: No chest pain, positive for palpitations GI: Negative; No nausea, vomiting, diarrhea, or abdominal pain GU: Negative; No dysuria, hematuria, or difficulty voiding Musculoskeletal: Negative; no myalgias, joint pain, or weakness Hematologic/Oncology: Negative; no easy bruising, bleeding Endocrine: Negative; no heat/cold intolerance; no diabetes Neuro: Negative; no changes in balance, headaches Skin: Negative; No rashes or skin lesions Psychiatric: Negative; No behavioral problems, depression Sleep: History of OSA, previously documented to be severe.  Remotely used CPAP and remotely had a customized oral appliance.  Currently snores, nocturia 2 times per night, nonrestorative sleep, and daytime sleepiness.  No restless legs, hypnogognic hallucinations, no cataplexy;  She received a new CPAP machine machine on September 25, 2019 but did not start using it until Nov 21, 2019 and unfortunately  due to failure to meet compliance her machine was picked up by the DME company. Other comprehensive 14 point system review is negative.   PHYSICAL EXAM:   VS:  BP (!) 147/72 (BP Location: Left Arm, Patient Position: Sitting, Cuff Size: Normal)   Pulse 67   Ht 5' 3.5" (1.613 m)   Wt 160 lb (72.6 kg)   SpO2 100%   BMI 27.90 kg/m     Repeat blood pressure by me was 138/72.  Wt Readings from Last 3 Encounters:  04/09/22 160 lb (72.6 kg)  02/27/22 154 lb (69.9 kg)  02/20/22 154 lb 9.6 oz (70.1 kg)    General: Alert, oriented, no distress.  Skin: normal turgor, no rashes, warm and dry HEENT: Normocephalic, atraumatic. Pupils equal round and reactive to light; sclera anicteric; extraocular muscles intact;  Nose without nasal septal hypertrophy Mouth/Parynx benign; Mallinpatti scale 3 Neck: No JVD, no carotid bruits; normal carotid upstroke Lungs: clear to ausculatation and percussion; no wheezing or rales Chest wall: without tenderness to palpitation Heart: PMI not displaced, RRR, s1 s2 normal, 1/6 systolic murmur, no diastolic murmur, no  rubs, gallops, thrills, or heaves Abdomen: soft, nontender; no hepatosplenomehaly, BS+; abdominal aorta nontender and not dilated by palpation. Back: no CVA tenderness Pulses 2+ Musculoskeletal: full range of motion, normal strength, no joint deformities Extremities: no clubbing cyanosis or edema, Homan's sign negative  Neurologic: grossly nonfocal; Cranial nerves grossly wnl Psychologic: Normal mood and affect     Studies/Labs Reviewed:   April 09, 2022 ECG (independently read by me): Sinus rhythm at 67,  PVCs, LBBB  December 31, 2020 ECG (independently read by me):  NSR at 76, PVC, LBBB  August 30, 2020 ECG (independently read by me):  Sinus rhythm at 81, occasional PVCs, LBBB  December 21, 2019 ECG (independently read by me): Sinus rhythm at 78 bpm with frequent PVCs.  Left bundle branch block with repolarization changes.  QTc interval 487  ms.  March 15, 2019 ECG (independently read by me): Sinus rhythm at 68 bpm, PVC, left bundle branch block with repolarization changes.  September 21, 2018 ECG (independently read by me): Sinus rhythm at 73 bpm with PACs, left bundle branch block with repolarization changes.  Recent Labs:    Latest Ref Rng & Units 01/13/2022   11:42 AM 12/11/2021    4:29 PM 07/10/2021   10:28 AM  BMP  Glucose 70 - 99 mg/dL 91  85  95   BUN 8 - 23 mg/dL '14  15  15   ' Creatinine 0.44 - 1.00 mg/dL 0.93  0.90  0.91   Sodium 135 - 145 mmol/L 140  141  141   Potassium 3.5 - 5.1 mmol/L 5.1  4.4  5.2   Chloride 98 - 111 mmol/L 106  111  105   CO2 22 - 32 mmol/L 32  20  33   Calcium 8.9 - 10.3 mg/dL 9.6  9.3  9.4         Latest Ref Rng & Units 01/13/2022   11:42 AM 07/10/2021   10:28 AM 06/01/2021    1:46 AM  Hepatic Function  Total Protein 6.5 - 8.1 g/dL 6.7  6.7  5.6   Albumin 3.5 - 5.0 g/dL 3.9  3.8  2.9   AST 15 - 41 U/L '14  14  19   ' ALT 0 - 44 U/L '12  12  12   ' Alk Phosphatase 38 - 126 U/L 64  73  58   Total Bilirubin 0.3 - 1.2 mg/dL 0.4  0.3  0.1        Latest Ref Rng & Units 01/13/2022   11:42 AM 12/11/2021    4:29 PM 07/10/2021   10:28 AM  CBC  WBC 4.0 - 10.5 K/uL 8.2  11.8  6.8   Hemoglobin 12.0 - 15.0 g/dL 12.6  12.2  11.9   Hematocrit 36.0 - 46.0 % 39.1  39.1  38.5   Platelets 150 - 400 K/uL 250  214  279    Lab Results  Component Value Date   MCV 81.8 01/13/2022   MCV 84.1 12/11/2021   MCV 80.5 07/10/2021   Lab Results  Component Value Date   TSH 1.02 09/19/2018   Lab Results  Component Value Date   HGBA1C 6.0 (H) 12/19/2018     BNP    Component Value Date/Time   BNP 21.7 05/15/2015 1339    ProBNP    Component Value Date/Time   PROBNP 65.0 09/19/2018 1532     Lipid Panel     Component Value Date/Time   CHOL 158 12/19/2018 1358  TRIG 113 12/19/2018 1358   HDL 57 12/19/2018 1358   CHOLHDL 2.8 12/19/2018 1358   CHOLHDL 5.1 08/23/2014 1623   VLDL 75 (H)  08/23/2014 1623   LDLCALC 78 12/19/2018 1358     RADIOLOGY: No results found.   Additional studies/ records that were reviewed today include:  I have reviewed the records of Dr. Viona Gilmore. Tollie Eth.   ECHO Doppler study was in August 2018 which showed moderate concentric LVH with abnormal septal wall motion due to left bundle branch block.  EF 50%.  Moderate LA dilation.  Mild to moderate MR.  Carotid duplex imaging from April 01, 2017: 1 to 39% right and left proximal internal carotid stenoses  Lexiscan Myoview studies March 25, 2017: Normal perfusion with EF at 43% with abnormal septal motion consistent with left bundle branch block.  The visual EF appeared higher.  It was felt that a low EF was affected by the bundle branch block and  gating artifact due to PVCs.  ECHO 12/09/2018 IMPRESSIONS   1. The left ventricle has a visually estimated ejection fraction of 45%.  The cavity size was normal. There is moderately increased left ventricular  wall thickness. Left ventricular diastolic Doppler parameters are  consistent with impaired relaxation.  Left ventrical global hypokinesis without regional wall motion  abnormalities. Septal-lateral dyssynchrony consistent with LBBB.   2. The right ventricle has normal systolic function. The cavity was  normal. There is no increase in right ventricular wall thickness.   3. Left atrial size was mildly dilated.   4. No evidence of mitral valve stenosis. Trivial mitral regurgitation.   5. The aortic valve is tricuspid. Mild calcification of the aortic valve.  No stenosis of the aortic valve.   6. The aortic root is normal in size and structure.   7. The IVC was normal in size. No complete TR doppler jet so unable to  estimate PA systolic pressure.    LONG TERM MONITOR 01/19/2020 The patient was monitored from June 28 through January 08, 2019 for 13 days and 12 hours.  The predominant rhythm was sinus rhythm with an average heart rate of 71 bpm.   There was bundle branch block/I VCD.  There were frequent isolated PVCs at 9.5%, occasional couplet at 1.5% and rare triplets less than 1%.   There were rare episodes of ventricular bigeminy and trigeminy.  There was 1 run of irregular ventricular tachycardia lasting 4 beats with an average rate of 128 with a maximum rate at 174.  There were 2 supraventricular tachycardic episodes with the fastest interval 14 beats (average rate 120  CT CHEST: 01/13/2022 FINDINGS: Cardiovascular: Incidental pulmonary embolus identified within the right middle lobar pulmonary artery, image 75/2. Heart size normal. Aortic atherosclerosis. No pericardial effusion identified.   Mediastinum/Nodes: No enlarged mediastinal, hilar, or axillary lymph nodes. Thyroid gland, trachea, and esophagus demonstrate no significant findings.   Lungs/Pleura: Postoperative changes from left lower lobectomy are identified. There are no complicating features identified. No pleural effusion, airspace consolidation, atelectasis or pneumothorax. Calcified granuloma identified within the right lower lobe, image 103/5. No new suspicious lung nodules identified.   Upper Abdomen: No acute findings within the imaged portions of the upper abdomen. The adrenal glands appear within normal limits. Status post cholecystectomy.   Musculoskeletal: No acute or suspicious osseous findings. There is ankylosis of the T7 through T12 vertebra. Degenerative disc disease noted within the upper thoracic and lower thoracic spine.   IMPRESSION: 1. Postoperative changes from left lower  lobectomy. No signs of recurrent tumor or metastatic disease within the chest. 2. Incidental note is made of pulmonary embolus within the right middle lobar pulmonary artery. 3. Aortic Atherosclerosis (ICD10-I70.0).  ASSESSMENT:    1. Essential hypertension   2. Nonischemic cardiomyopathy ([place]); history of Takotsubo 2011   3. LBBB (left bundle branch block)   4.  OSA (obstructive sleep apnea)   5. Aortic atherosclerosis ([place])   6. Hyperlipidemia with target LDL less than 70   7. Primary adenocarcinoma of lower lobe of left lung ([place])   8. Pulmonary embolus, right ([place])   9. Anticoagulated     PLAN:  Ms. Emme Rosenau is a 76 year-old female who suffered a non-ST segment elevation myocardial infarction in June 2011 at which time she was found to have findings consistent with Takotsubo cardiomyopathy.  EF at that time was 30 to 35%.  Coronary angiography revealed normal coronary arteries.  Her cardiomyopathy was nonischemic.  She was treated with initial medical therapy including ACE inhibition, low-dose beta-blocker therapy as well as diuretic.  Subsequently, she was followed  by Dr. Tollie Eth  An echo Doppler assessment EF was approximately 50%.  She has chronic left bundle branch block.  She has a history of hypertension and  has been managed with hydralazine 50 mg daily, irbesartan 300 mg, Toprol XL 25 mg in addition to spironolactone 12.5 mg daily.   An echo Doppler study from June 2020  revealed an EF of approximately 45% and findings consistent with her left bundle branch block with septal dyssynergy and grade 1 diastolic dysfunction.  Her most recent echo of June 24, 2020 showed an EF of 40 to 45%, and left bundle branch block creating abnormal septal motion.  There was grade 1 diastolic dysfunction.  She has had issues with hypertension and had some difficulty with some of her medications in the past.  Presently, she is on hydralazine but has difficulty taking 25 mg 3 times a day, irbesartan 300 mg, and has been taking metoprolol succinate 75 mg daily.  Her blood pressure today initially was 147/72.  For medication simplification I have suggested slight adjustment of hydralazine to 50 mg twice a day.  She continues to have poor sleep.  She no longer has a CPAP device.  Remotely she had benefited from oral appliance but stopped using this since she  felt it affected her teeth.  I have suggested a follow-up evaluation with Dr. Oneal Grout for reassessment and possible reinitiate a new customized oral appliance.  She underwent robotic left lower lobectomy for an enlarging lung nodule by Dr. Roxan Hockey and was felt to have T1, N0, stage I adenocarcinoma.  She was incidentally found to have a small PE in the right middle lobe pulmonary artery for which she was started on Eliquis by Dr. Earlie Server.  She is not having any chest pain.  She continues to be on atorvastatin 40 mg daily for hyperlipidemia.  Shirline Frees checks her laboratory.  She admits to reduced hearing in her left ear.  She will monitor her blood pressure with her medication adjustment and as long as she remains stable I will see her in 6 months for reevaluation or sooner as needed.   Medication Adjustments/Labs and Tests Ordered: Current medicines are reviewed at length with the patient today.  Concerns regarding medicines are outlined above.  Medication changes, Labs and Tests ordered today are listed in the Patient Instructions below. Patient Instructions  Medication Instructions:  CHANGE: Hydralazine to 50  mg twice a day  *If you need a refill on your cardiac medications before your next appointment, please call your pharmacy*   Lab Work: None ordered today   Testing/Procedures: None ordered today   Follow-Up: At [facility], you and your health needs are our priority.  As part of our continuing mission to provide you with exceptional heart care, we have created designated Provider Care Teams.  These Care Teams include your primary Cardiologist (physician) and Advanced Practice Providers (APPs -  Physician Assistants and Nurse Practitioners) who all work together to provide you with the care you need, when you need it.  We recommend signing up for the patient portal called "MyChart".  Sign up information is provided on this After Visit Summary.  MyChart is used to  connect with patients for Virtual Visits (Telemedicine).  Patients are able to view lab/test results, encounter notes, upcoming appointments, etc.  Non-urgent messages can be sent to your provider as well.   To learn more about what you can do with MyChart, go to [email or web address].    Your next appointment:   6 month(s)  The format for your next appointment:   In Person  Provider:   Shelva Majestic, MD              Signed, Shelva Majestic, MD  04/11/2022 11:57 AM    [facility] [address] [place], [place], [place]  [address] Phone: [phone]

## 2022-04-09 NOTE — Patient Instructions (Signed)
Medication Instructions:  CHANGE: Hydralazine to 50 mg twice a day  *If you need a refill on your cardiac medications before your next appointment, please call your pharmacy*   Lab Work: None ordered today   Testing/Procedures: None ordered today   Follow-Up: At [facility], you and your health needs are our priority.  As part of our continuing mission to provide you with exceptional heart care, we have created designated Provider Care Teams.  These Care Teams include your primary Cardiologist (physician) and Advanced Practice Providers (APPs -  Physician Assistants and Nurse Practitioners) who all work together to provide you with the care you need, when you need it.  We recommend signing up for the patient portal called "MyChart".  Sign up information is provided on this After Visit Summary.  MyChart is used to connect with patients for Virtual Visits (Telemedicine).  Patients are able to view lab/test results, encounter notes, upcoming appointments, etc.  Non-urgent messages can be sent to your provider as well.   To learn more about what you can do with MyChart, go to [email or web address].    Your next appointment:   6 month(s)  The format for your next appointment:   In Person  Provider:   Shelva Majestic, MD

## 2022-04-11 ENCOUNTER — Encounter: Payer: Self-pay | Admitting: Cardiovascular Disease

## 2022-04-13 ENCOUNTER — Other Ambulatory Visit (HOSPITAL_COMMUNITY): Payer: Self-pay

## 2022-04-13 DIAGNOSIS — N8111 Cystocele, midline: Secondary | ICD-10-CM | POA: Diagnosis not present

## 2022-04-13 DIAGNOSIS — N816 Rectocele: Secondary | ICD-10-CM | POA: Diagnosis not present

## 2022-04-13 DIAGNOSIS — Z4689 Encounter for fitting and adjustment of other specified devices: Secondary | ICD-10-CM | POA: Diagnosis not present

## 2022-04-13 DIAGNOSIS — K5902 Outlet dysfunction constipation: Secondary | ICD-10-CM | POA: Diagnosis not present

## 2022-04-14 ENCOUNTER — Other Ambulatory Visit (HOSPITAL_COMMUNITY): Payer: Self-pay

## 2022-04-16 ENCOUNTER — Ambulatory Visit: Payer: Medicare Other | Admitting: Neurology

## 2022-05-11 ENCOUNTER — Other Ambulatory Visit (HOSPITAL_COMMUNITY): Payer: Self-pay

## 2022-05-11 ENCOUNTER — Other Ambulatory Visit: Payer: Self-pay | Admitting: Internal Medicine

## 2022-05-11 DIAGNOSIS — I2699 Other pulmonary embolism without acute cor pulmonale: Secondary | ICD-10-CM

## 2022-05-11 MED ORDER — APIXABAN 5 MG PO TABS
5.0000 mg | ORAL_TABLET | Freq: Two times a day (BID) | ORAL | 3 refills | Status: DC
  Filled 2022-05-11: qty 60, 30d supply, fill #0
  Filled 2022-06-16 (×2): qty 60, 30d supply, fill #1

## 2022-05-11 NOTE — Telephone Encounter (Signed)
Pt requests Eloquis refill. Message sent to Dr. Julien Nordmann.

## 2022-05-12 DIAGNOSIS — M170 Bilateral primary osteoarthritis of knee: Secondary | ICD-10-CM | POA: Diagnosis not present

## 2022-05-16 ENCOUNTER — Other Ambulatory Visit (HOSPITAL_COMMUNITY): Payer: Self-pay

## 2022-05-17 NOTE — Therapy (Signed)
OUTPATIENT PHYSICAL THERAPY FEMALE PELVIC EVALUATION   Patient Name: Laura [name] MRN: [id number] DOB:12-16-1945, 76 y.o., female Today's Date: 05/18/2022  END OF SESSION:  PT End of Session - 05/18/22 1009     Visit Number 1    Date for PT Re-Evaluation 07/13/22    Authorization Type Medicare    PT Start Time 0930    PT Stop Time 4098    PT Time Calculation (min) 45 min    Activity Tolerance Patient tolerated treatment well    Behavior During Therapy WFL for tasks assessed/performed             Past Medical History:  Diagnosis Date   Anemia    Anxiety    Asthma    related to sesonal allergies   Cancer ([place])    Chronic combined systolic and diastolic CHF, NYHA class 2 ([place]) CARDIOLOGIST-  DR [id number]   Coronary artery disease    Depression    History of kidney stones    History of non-ST elevation myocardial infarction (NSTEMI) 11/27/2009   SECONDARY TO TAKOTSUDO SYNDROME (CARDIAC CATH NORMAL)   Hyperlipemia    Hypertension    Hypoglycemia    Hypothyroidism    LBBB (left bundle branch block)    Left ventricular ejection fraction less than 40%    38% PER CARDIOLOGIST NOTE (DR CROITORU)   Memory loss    Mood swings    Myocardial infarction ([place]) 2011   Nonischemic dilated cardiomyopathy ([place])    MODERATELY DEPRESSED LVF;EF 35-45% by Echo 05/27/11   OSA (obstructive sleep apnea) MODERATE PER STUDY 2005   CPAP NONCOMPLIANT   Pre-diabetes    Seasonal allergies    SUI (stress urinary incontinence, female)    Past Surgical History:  Procedure Laterality Date   ABDOMINAL HYSTERECTOMY  06/30/1983   partial   CARDIAC CATHETERIZATION  09-04-1999;  08/25/2004;   12/09/2009  DR Sallyanne Kuster   NORMAL CORONARIES/  APICAL BALLOONING OF LV CONSISTENT WITH TAKOTSUBO SYMPTOMS/ EF 30-35%   CATARACT EXTRACTION W/ INTRAOCULAR LENS  IMPLANT, BILATERAL     CHOLECYSTECTOMY N/A 09/29/2012   Procedure: LAPAROSCOPIC CHOLECYSTECTOMY WITH INTRAOPERATIVE CHOLANGIOGRAM;  Surgeon: Adin Hector, MD;  Location: [facility];  Service: General;  Laterality: N/A;   COLONOSCOPY  07/2020   CYSTOSCOPY N/A 09/19/2012   Procedure: Erlene Quan;  Surgeon: Bernestine Amass, MD;  Location: [facility];  Service: Urology;  Laterality: N/A;   DILATION AND CURETTAGE OF UTERUS     EYE SURGERY Bilateral    cataract removal   INTERCOSTAL NERVE BLOCK  05/30/2021   Procedure: INTERCOSTAL NERVE BLOCK;  Surgeon: Melrose Nakayama, MD;  Location: [facility];  Service: Thoracic;;   KNEE ARTHROSCOPY W/ MENISCECTOMY  07/27/2011   MEDIAL AND LATERAL   LOBECTOMY  05/30/2021   Procedure: LEFT LOWER LOBECTOMY;  Surgeon: Melrose Nakayama, MD;  Location: [facility];  Service: Thoracic;;   LYMPH NODE DISSECTION  05/30/2021   Procedure: LYMPH NODE DISSECTION;  Surgeon: Melrose Nakayama, MD;  Location: [facility];  Service: Thoracic;;   NASAL SEPTUM SURGERY  02/28/1979   PUBOVAGINAL SLING N/A 09/19/2012   Procedure: SUBURETHRAL Janyth Pupa;  Surgeon: Bernestine Amass, MD;  Location[facility];  Service: Urology;  Laterality: N/A;   RIGHT URETEROSCOPIC STONE EXTRACTION  08/31/2000   TRANSTHORACIC ECHOCARDIOGRAM  05-27-2011  DR CROITORU   MODERATELY DEPRESSED LVF DUE TO GLOBAL HYPOKINESIS AND MARKED SYSTOLIC ASYNCHRONY/ EF 95%/ MILD LEFT ATRIAL DILATATION  Patient Active Problem List   Diagnosis Date Noted   Pulmonary embolus, right ([place]) 01/19/2022   Primary adenocarcinoma of lower lobe of left lung ([place]) 07/10/2021   S/P lobectomy of lung 05/30/2021   Multiple pulmonary nodules determined by computed tomography of lung 04/18/2021   GERD (gastroesophageal reflux disease) 01/20/2019   Abnormal chest x-ray 10/20/2018   Cough variant asthma vs UACS/vcd 09/19/2018   DOE (dyspnea on exertion) 09/19/2018   Memory loss 10/14/2015   Mixed hyperlipidemia 08/23/2014   Major depressive disorder, recurrent episode, severe ([place]) 09/22/2013   Vitamin D Deficiency 07/28/2013    Medication management 07/28/2013   Prediabetes 07/28/2013   Obstructive sleep apnea 05/27/2013   Arrhythmia 04/14/2013   Palpitations 03/12/2013   Delayed gastric emptying 10/18/2012   Steatohepatitis, nonalcoholic 75/03/2584   Female stress incontinence 09/19/2012   Takotsubo syndrome, June 2011.(normal coronaries) 09/12/2012   Cardiomyopathy- EF 45-50% by echo 09/26/12 09/12/2012   Anxiety disorder  09/12/2012   Essential hypertension 09/12/2012   LBBB (left bundle branch block) 09/12/2012   Obesity 09/12/2012   Sleep apnea- non compliant with C-pap 09/12/2012    PCP: Shirline Frees, MD   REFERRING PROVIDER: Tomasa Blase, NP  REFERRING DIAG: K59.00 (ICD-10-CM) - CN (constipation) M62.89 (ICD-10-CM) - PFD (pelvic floor dysfunction) N39.3 (ICD-10-CM) - Stress incontinence  THERAPY DIAG:  Abnormal posture  Muscle weakness (generalized)  Unspecified lack of coordination  Rationale for Evaluation and Treatment: Rehabilitation  ONSET DATE: 2022  SUBJECTIVE:                                                                                                                                                                                           05/18/22 SUBJECTIVE STATEMENT: Pt states that she has been sick for 3 years. She has low back pain and has to have injections; she has Rt knee pain with arthritis. Constipation started as she was going through medical dx process in 2022. She was placed on many different medications for constipation that did not do anything. She has had problems with Lt thoracic incision ever since. Urinary issues started again since constipation worsened - she did have bad incontinence but it was better after sling.  Fluid intake: Yes: -    PAIN:  Are you having pain? Yes NPRS scale: 2/10, 10/10 at worst Pain location:  low back  Pain type: aching Pain description: constant   Aggravating factors: walking,standing Relieving factors: pain  medication, back brace, sleeping inclined, roll on pain medication  PRECAUTIONS: None  WEIGHT BEARING RESTRICTIONS: No  FALLS:  Has patient fallen in last 6 months? No  LIVING ENVIRONMENT: Lives with:  lives with their spouse Lives in: House/apartment   OCCUPATION: retired  PLOF: Independent  PATIENT GOALS: decrease  PERTINENT HISTORY:  G4P2, Hx lung cancer with lobectomy (05/2021), cardiomyopathy and CHF, pubovaginal sling 2014 Sexual abuse: No  BOWEL MOVEMENT: Pain with bowel movement: No Type of bowel movement:Frequency usually 1-2x/day and Strain Yes - used to go weeks at a time without having one; better with pessary and metamucil  Fully empty rectum: No Leakage: No Pads: No Fiber supplement: Yes: using metamucil *regularly using enemas to empty rectum  URINATION: Pain with urination: No Fully empty bladder: No -will void more if she sits there.  Stream: Strong and sometimes hesitant Urgency: Yes: - Frequency: a whole lot; wakes at night to go to the bathroom 2-3x; 5x/day Leakage: Urge to void, Coughing, Sneezing, Laughing, and Exercise Pads: Yes: -  INTERCOURSE: Pain with intercourse:  not currently sexually active Ability to have vaginal penetration:  No Climax: - Marinoff Scale: 3/3  PREGNANCY: Vaginal deliveries 2 Tearing Yes: - C-section deliveries 0 Currently pregnant No  PROLAPSE: Rectocele has been fit with a pessary   OBJECTIVE:  05/18/22: DIAGNOSTIC FINDINGS:  CT/PET scan with lung cancer diagnosis  COGNITION: Overall cognitive status: Within functional limits for tasks assessed     SENSATION: Light touch: Appears intact Proprioception: Appears intact  MUSCLE LENGTH:   FUNCTIONAL TESTS: 5x STS: 26 seconds;   GAIT: Comments: antalgic gait pattern, decreased trunk rotation and bil hip extension  POSTURE: rounded shoulders, forward head, decreased lumbar lordosis, posterior pelvic tilt, and right elevated iliac  crest  LUMBARAROM/PROM: flexion 75%, extension 20%, Rt side bend 0% with severe pain, Lt side bend 50% with LOB backwards; difficulty with return to standing after flexion   PALPATION:   General  scar tissue restriction over L rib cage/side                External Perineal Exam NA                             Internal Pelvic Floor NA  Patient confirms identification and approves PT to assess internal pelvic floor and treatment Yes - future treatment session  PELVIC MMT:   MMT eval  Vaginal   Internal Anal Sphincter   External Anal Sphincter   Puborectalis   Diastasis Recti   (Blank rows = not tested)        TONE: NA  PROLAPSE: NA  TODAY'S TREATMENT:                                                                                                                              DATE: 05/18/22  EVAL     PATIENT EDUCATION:  Education details: pelvic floor anatomy Person educated: Patient Education method: Explanation, Demonstration, Tactile cues, Verbal cues, and Handouts Education comprehension: verbalized understanding  HOME EXERCISE PROGRAM: None today  ASSESSMENT:  CLINICAL IMPRESSION: Patient is a 76  y.o. female who was seen today for physical therapy evaluation and treatment for constipation management, urinary incontinence, and low back pain. Exam findings limited due to extensive subjective history today, but were notable for antalgic gait pattern, abnormal posture, decreased lumbar A/ROM with LOB, thoracic/rib cage scar tissue restriction, and increased time for 5x STS test indicating significant fall risk. Signs and symptoms are most consistent with core weakness that is impacting bowel, urinary, and lumbopelvic function leading to symptoms. She will benefit from skilled PT intervention in order to decrease urinary incontinence, improve constipation management, decrease low back pain, and improve QOL.   OBJECTIVE IMPAIRMENTS: decreased activity tolerance, decreased  coordination, decreased endurance, decreased mobility, decreased strength, increased fascial restrictions, increased muscle spasms, impaired tone, postural dysfunction, and pain.   ACTIVITY LIMITATIONS: bending, standing, squatting, transfers, and continence  PARTICIPATION LIMITATIONS: community activity and exercise  PERSONAL FACTORS: 3+ comorbidities: G4P2, Hx lung cancer with lobectomy (05/2021), cardiomyopathy and CHF, pubovaginal sling 2014  are also affecting patient's functional outcome.   REHAB POTENTIAL: Fair due to personal factors/medical history  CLINICAL DECISION MAKING: Stable/uncomplicated  EVALUATION COMPLEXITY: Low   GOALS: Goals reviewed with patient? Yes  SHORT TERM GOALS: Target date: 06/15/22  Pt will be independent with HEP.   Baseline: Goal status: INITIAL  2.  Pt will be independent with use of squatty potty, relaxed toileting mechanics, and improved bowel movement techniques in order to increase ease of bowel movements and complete evacuation.   Baseline:  Goal status: INITIAL  3.  Pt will be independent with the knack, urge suppression technique, and double voiding in order to improve bladder habits and decrease urinary incontinence.   Baseline:  Goal status: INITIAL  4.  Pt will be independent with diaphragmatic breathing and down training activities in order to improve pelvic floor relaxation.  Baseline:  Goal status:    LONG TERM GOALS: Target date: 07/13/21  Pt will be independent with advanced HEP.   Baseline:  Goal status: INITIAL  2.  Pt will demonstrate normal pelvic floor muscle tone and A/ROM, able to achieve 3/5 strength with contractions and 10 sec endurance, in order to provide appropriate lumbopelvic support in functional activities.   Baseline:  Goal status: INITIAL  3.  Pt will report no leaks with laughing, coughing, sneezing in order to improve comfort with interpersonal relationships and community activities.    Baseline:  Goal status: INITIAL  4.  Pt will have 1 bowel movement a day without straining, use of appropriate body mechanics, and complete emptying without use of enema in order to decrease low back pain, improve urinary incontinence, and improve bowel health. Baseline:  Goal status: INITIAL  5.  Pt will report decrease in highest low back pain to no greater than 6/10 and increase all lumbar A/ROM by 25% in order to improve functional ability.  Baseline:  Goal status: INITIAL  6.  Pt decrease 5x STS test time by 5 seconds in order to decrease risk for falls and demonstrate improved functional ability. Baseline:  Goal status: INITIAL  PLAN:  PT FREQUENCY: 1x/week  PT DURATION: 8 weeks  PLANNED INTERVENTIONS: Therapeutic exercises, Therapeutic activity, Neuromuscular re-education, Balance training, Gait training, Patient/Family education, Self Care, Joint mobilization, Dry Needling, Biofeedback, and Manual therapy  PLAN FOR NEXT SESSION: Possible pelvic floor assessment; mobility exercises; breath work with manual to thoracic scar tissue.    Heather Roberts, PT, DPT11/20/2310:40 AM

## 2022-05-18 ENCOUNTER — Other Ambulatory Visit: Payer: Self-pay

## 2022-05-18 ENCOUNTER — Ambulatory Visit: Payer: Medicare Other | Attending: Student

## 2022-05-18 DIAGNOSIS — R293 Abnormal posture: Secondary | ICD-10-CM | POA: Insufficient documentation

## 2022-05-18 DIAGNOSIS — R279 Unspecified lack of coordination: Secondary | ICD-10-CM | POA: Diagnosis not present

## 2022-05-18 DIAGNOSIS — M6281 Muscle weakness (generalized): Secondary | ICD-10-CM | POA: Diagnosis not present

## 2022-05-28 ENCOUNTER — Ambulatory Visit (INDEPENDENT_AMBULATORY_CARE_PROVIDER_SITE_OTHER): Payer: Medicare Other | Admitting: Neurology

## 2022-05-28 ENCOUNTER — Telehealth: Payer: Self-pay | Admitting: Neurology

## 2022-05-28 ENCOUNTER — Encounter: Payer: Self-pay | Admitting: Neurology

## 2022-05-28 VITALS — BP 146/76 | HR 60 | Ht 63.6 in | Wt 156.2 lb

## 2022-05-28 DIAGNOSIS — G8929 Other chronic pain: Secondary | ICD-10-CM

## 2022-05-28 DIAGNOSIS — M545 Low back pain, unspecified: Secondary | ICD-10-CM | POA: Diagnosis not present

## 2022-05-28 DIAGNOSIS — R202 Paresthesia of skin: Secondary | ICD-10-CM | POA: Insufficient documentation

## 2022-05-28 MED ORDER — DULOXETINE HCL 30 MG PO CPEP: 30.0000 mg | ORAL_CAPSULE | Freq: Every day | ORAL | 0 refills | Status: DC

## 2022-05-28 MED ORDER — DULOXETINE HCL 60 MG PO CPEP: 60.0000 mg | ORAL_CAPSULE | Freq: Every day | ORAL | 11 refills | Status: DC

## 2022-05-28 MED ORDER — GABAPENTIN 100 MG PO CAPS: 300.0000 mg | ORAL_CAPSULE | Freq: Three times a day (TID) | ORAL | 11 refills | Status: DC

## 2022-05-28 MED ORDER — PREGABALIN 75 MG PO CAPS: 75.0000 mg | ORAL_CAPSULE | Freq: Three times a day (TID) | ORAL | 5 refills | Status: DC

## 2022-05-28 NOTE — Progress Notes (Signed)
Chief Complaint  Patient presents with   New Patient (Initial Visit)    Rm 12. Accompanied by husband, Richard. NX TRUE [place] [facility] 2017/internal referral for neuropathy, hosp f/u stroke like symptoms.      ASSESSMENT AND PLAN  [name] is a 76 y.o. female   Gradual onset slow worsening bilateral lower extremity paresthesia, frequent nighttime cramping, Chronic low back pain radiating pain to right hip  Mild length-dependent sensory changes, likely peripheral neuropathy, restless leg syndrome, component of lumbar radiculopathy  EMG nerve conduction study  MRI of lumbar spine  Tried gabapentin in the past, unsure of dosage, did not help her,  Add on Lyrica 50 mg titrating to 3 times daily  Cymbalta 30 mg titrating to 60 mg daily  Her depression, anxiety psychiatry certainly plays roles in her complaints  DIAGNOSTIC DATA (LABS, IMAGING, TESTING) - I reviewed patient records, labs, notes, testing and imaging myself where available.  MRI of brain wo on February 04 2022, [place] [facility] periventricular and deep white matter T2/FLAIR hyperintensities are nonspecific but compatible with mild chronic microangiopathic changes. No abnormally restricted diffusion to suggest acute or subacute infarction. No mass or mass effect.   CTA of neck and head showed no large vessel disease.  CT chest on January 13 2022: 1. Postoperative changes from left lower lobectomy. No signs of recurrent tumor or metastatic disease within the chest. 2. Incidental note is made of pulmonary embolus within the right middle lobar pulmonary artery. 3. Aortic Atherosclerosis (ICD10-I70.0).  Laboratory evaluation September 2023, CBC showed hemoglobin of 13.3, decreased MCV of 80, CMP showed mild elevation of calcium 10.5, normal creatinine 0.83, A1c 6.1, normal lipid panel, LDL 83, normal TSH,  MEDICAL HISTORY:  Laura [name], is a 76 year old female, seen in request by her primary care physician Dr. Kenton Kingfisher,  Gwyndolyn Saxon, accompanied by her husband for evaluation of bilateral lower extremity paresthesia, muscle cramping, initial evaluation May 28, 2022  I reviewed and summarized the referring note.PMHX HLD HTN Hypothyrodism Depression,  CAD OSA- could not tolerate CPAP Pulmonary Embolic since June 3664.  Patient had a history of increasing left lower lobe lung nodule, underwent left VATS wedge resection, frozen section proven to be adenocarcinoma, then left lower lobectomy with lymph node dissection, in December 2022, pathology confirmed T1 N0 stage Ia adenocarcinoma, tumor measured 2 cm in diameter,  Patient complains of worsening anxiety depression, difficulty sleeping since her lung cancer treatment  She already had few years history of bilateral feet numbness tingling, feels like she has been working on a piece of paper, getting worse since 2020, now has frequent burning pain, especially at nighttime, muscle cramping of bilateral feet, lower extremity, she also has history of chronic low back pain, was under the care of local orthopedic surgeon, pain management, epidural injection and ablation did not provide consistent help  She complains of moderate low back pain radiating pain to right hip, significant right knee pain, gait abnormality  She felt so miserable, sometimes lie in bed crying all night, difficulty sleeping, tried gabapentin was not sure about the dose did not help her symptoms, Cymbalta was helpful, but she has associated Cymbalta with her weight gain many years ago while she was dealing with congestive heart failure water retention   PHYSICAL EXAM:   Vitals:   05/28/22 1358  BP: (!) 146/76  Pulse: 60  Weight: 156 lb 3.2 oz (70.9 kg)  Height: 5' 3.6" (1.615 m)   Not recorded  Body mass index is 27.15 kg/m.  PHYSICAL EXAMNIATION:  Gen: NAD, conversant, well nourised, well groomed                     Cardiovascular: Regular rate rhythm, no peripheral edema,  warm, nontender. Eyes: Conjunctivae clear without exudates or hemorrhage Neck: Supple, no carotid bruits. Pulmonary: Clear to auscultation bilaterally   NEUROLOGICAL EXAM:  MENTAL STATUS: Speech/cognition: Depression looking elderly female, awake, alert, oriented to history taking and casual conversation CRANIAL NERVES: CN II: Visual fields are full to confrontation. Pupils are round equal and briskly reactive to light. CN III, IV, VI: extraocular movement are normal. No ptosis. CN V: Facial sensation is intact to light touch CN VII: Face is symmetric with normal eye closure  CN VIII: Hearing is normal to causal conversation. CN IX, X: Phonation is normal. CN XI: Head turning and shoulder shrug are intact  MOTOR: There is no pronator drift of out-stretched arms. Muscle bulk and tone are normal. Muscle strength is normal.  REFLEXES: Reflexes are hypoactive if and symmetric at the biceps, triceps, knees, and ankles. Plantar responses are flexor.  SENSORY: Decreased vibratory sensation at toes, length-dependent decreased light touch, pinprick  COORDINATION: There is no trunk or limb dysmetria noted.  GAIT/STANCE: She can get up from seated position arm crossed, antalgic,  REVIEW OF SYSTEMS:  Full 14 system review of systems performed and notable only for as above All other review of systems were negative.   ALLERGIES: Allergies  Allergen Reactions   Codeine Anaphylaxis, Hives and Other (See Comments)    Headache. Daughter reported that it caused her throat to swell up    Ciprofloxacin Nausea And Vomiting   Ace Inhibitors Other (See Comments)    Unknown- it "didn't agree with her"    Citalopram Other (See Comments)    Fatigue   Fetzima [Levomilnacipran] Other (See Comments)    "Talking out of my head"   Lasix [Furosemide] Other (See Comments)    HEADACHE   Nsaids Other (See Comments)    Told not to take NSAIDs because of her heart    Sulfamethoxazole-Trimethoprim  Other (See Comments)    NERVOUS AND DISORENTED   Xanax Xr [Alprazolam Er] Other (See Comments)    confusion    HOME MEDICATIONS: Current Outpatient Medications  Medication Sig Dispense Refill   apixaban (ELIQUIS) 5 MG TABS tablet Take 1 tablet (5 mg total) by mouth 2 (two) times daily. 60 tablet 3   atorvastatin (LIPITOR) 80 MG tablet TAKE 1/2 TABLET BY MOUTH DAILY FOR CHOLESTEROL (Patient taking differently: Take 40 mg by mouth daily. TAKE 1/2 TABLET BY MOUTH DAILY FOR CHOLESTEROL) 45 tablet 3   diazepam (VALIUM) 5 MG tablet Take 1 tablet (5 mg total) by mouth every 12 (twelve) hours as needed for muscle spasms. (Patient taking differently: Take 10 mg by mouth every 12 (twelve) hours as needed for muscle spasms. Take 10 mg by mouth) 10 tablet 0   hydrALAZINE (APRESOLINE) 50 MG tablet Take 1 tablet (50 mg total) by mouth 2 (two) times daily. 180 tablet 3   HYDROcodone-acetaminophen (NORCO) 7.5-325 MG tablet Take 1 tablet by mouth as needed.     irbesartan (AVAPRO) 300 MG tablet Take 300 mg by mouth daily.     levothyroxine (SYNTHROID, LEVOTHROID) 88 MCG tablet Take 88 mcg by mouth daily before breakfast.     Melatonin 10 MG TABS Take 5 mg by mouth at bedtime as needed.     metoprolol  succinate (TOPROL-XL) 50 MG 24 hr tablet TAKE ONE AND ONE-HALF TABLETS BY MOUTH DAILY 90 tablet 3   NUVIGIL 250 MG tablet Take 125 mg by mouth daily as needed (energy boost).  3   No current facility-administered medications for this visit.    PAST MEDICAL HISTORY: Past Medical History:  Diagnosis Date   Anemia    Anxiety    Asthma    related to sesonal allergies   Cancer (HCC)    Chronic combined systolic and diastolic CHF, NYHA class 2 ([place]) CARDIOLOGIST-  DR [id number]   Coronary artery disease    Depression    History of kidney stones    History of non-ST elevation myocardial infarction (NSTEMI) 11/27/2009   SECONDARY TO TAKOTSUDO SYNDROME (CARDIAC CATH NORMAL)   Hyperlipemia    Hypertension     Hypoglycemia    Hypothyroidism    LBBB (left bundle branch block)    Left ventricular ejection fraction less than 40%    38% PER CARDIOLOGIST NOTE (DR CROITORU)   Memory loss    Mood swings    Myocardial infarction ([place]) 2011   Nonischemic dilated cardiomyopathy ([place])    MODERATELY DEPRESSED LVF;EF 35-45% by Echo 05/27/11   OSA (obstructive sleep apnea) MODERATE PER STUDY 2005   CPAP NONCOMPLIANT   Pre-diabetes    Seasonal allergies    SUI (stress urinary incontinence, female)     PAST SURGICAL HISTORY: Past Surgical History:  Procedure Laterality Date   ABDOMINAL HYSTERECTOMY  06/30/1983   partial   CARDIAC CATHETERIZATION  09-04-1999;  08/25/2004;   12/09/2009  DR CROITORU   NORMAL CORONARIES/  APICAL BALLOONING OF LV CONSISTENT WITH TAKOTSUBO SYMPTOMS/ EF 30-35%   CATARACT EXTRACTION W/ INTRAOCULAR LENS  IMPLANT, BILATERAL     CHOLECYSTECTOMY N/A 09/29/2012   Procedure: LAPAROSCOPIC CHOLECYSTECTOMY WITH INTRAOPERATIVE CHOLANGIOGRAM;  Surgeon: Adin Hector, MD;  Location: [facility];  Service: General;  Laterality: N/A;   COLONOSCOPY  07/2020   CYSTOSCOPY N/A 09/19/2012   Procedure: Erlene Quan;  Surgeon: Bernestine Amass, MD;  Location: [facility];  Service: Urology;  Laterality: N/A;   DILATION AND CURETTAGE OF UTERUS     EYE SURGERY Bilateral    cataract removal   INTERCOSTAL NERVE BLOCK  05/30/2021   Procedure: INTERCOSTAL NERVE BLOCK;  Surgeon: Melrose Nakayama, MD;  Location: [facility];  Service: Thoracic;;   KNEE ARTHROSCOPY W/ MENISCECTOMY  07/27/2011   MEDIAL AND LATERAL   LOBECTOMY  05/30/2021   Procedure: LEFT LOWER LOBECTOMY;  Surgeon: Melrose Nakayama, MD;  Location: [facility];  Service: Thoracic;;   LYMPH NODE DISSECTION  05/30/2021   Procedure: LYMPH NODE DISSECTION;  Surgeon: Melrose Nakayama, MD;  Location: [facility];  Service: Thoracic;;   NASAL SEPTUM SURGERY  02/28/1979   PUBOVAGINAL SLING N/A 09/19/2012   Procedure: SUBURETHRAL  Janyth Pupa;  Surgeon: Bernestine Amass, MD;  Location: [facility];  Service: Urology;  Laterality: N/A;   RIGHT URETEROSCOPIC STONE EXTRACTION  08/31/2000   TRANSTHORACIC ECHOCARDIOGRAM  05-27-2011  DR CROITORU   MODERATELY DEPRESSED LVF DUE TO GLOBAL HYPOKINESIS AND MARKED SYSTOLIC ASYNCHRONY/ EF 26%/ MILD LEFT ATRIAL DILATATION    FAMILY HISTORY: Family History  Problem Relation Age of Onset   Pneumonia Mother    Hypertension Mother    Heart attack Father    Heart attack Paternal Grandfather    Fibromyalgia Brother    Pulmonary embolism Brother    Hypertension Brother    Heart  disease Brother    Breast cancer Neg Hx     SOCIAL HISTORY: Social History   Socioeconomic History   Marital status: Married    Spouse name: Richard   Number of children: 2   Years of education: 12+   Highest education level: Some college, no degree  Occupational History   Occupation: Retired  Tobacco Use   Smoking status: Never   Smokeless tobacco: Never  Vaping Use   Vaping Use: Never used  Substance and Sexual Activity   Alcohol use: No    Alcohol/week: 0.0 standard drinks of alcohol   Drug use: No   Sexual activity: Not Currently    Birth control/protection: Post-menopausal  Other Topics Concern   Not on file  Social History Narrative   Lives at home with husband.   Right-handed.   Drinks 2-3 cups caffeine per day.   No regular exercise.   Social Determinants of Health   Financial Resource Strain: Not on file  Food Insecurity: Not on file  Transportation Needs: Not on file  Physical Activity: Not on file  Stress: Not on file  Social Connections: Not on file  Intimate Partner Violence: Not on file      Marcial Pacas, M.D. Ph.D.  [facility] [address] [place], [place] [address] Ph: [phone] Fax: [phone]  CC:  Lendon Colonel, NP [address] [place],  [place] [address]  Shirline Frees, MD

## 2022-05-28 NOTE — Telephone Encounter (Signed)
medicare/[place] sent to [facility] [phone]

## 2022-06-01 DIAGNOSIS — M47816 Spondylosis without myelopathy or radiculopathy, lumbar region: Secondary | ICD-10-CM | POA: Diagnosis not present

## 2022-06-10 DIAGNOSIS — J209 Acute bronchitis, unspecified: Secondary | ICD-10-CM | POA: Diagnosis not present

## 2022-06-13 DIAGNOSIS — U071 COVID-19: Secondary | ICD-10-CM | POA: Diagnosis not present

## 2022-06-13 DIAGNOSIS — R059 Cough, unspecified: Secondary | ICD-10-CM | POA: Diagnosis not present

## 2022-06-15 ENCOUNTER — Ambulatory Visit: Payer: Medicare Other | Attending: Student

## 2022-06-15 DIAGNOSIS — R293 Abnormal posture: Secondary | ICD-10-CM | POA: Diagnosis not present

## 2022-06-15 DIAGNOSIS — M62838 Other muscle spasm: Secondary | ICD-10-CM | POA: Diagnosis not present

## 2022-06-15 DIAGNOSIS — M6281 Muscle weakness (generalized): Secondary | ICD-10-CM | POA: Diagnosis not present

## 2022-06-15 DIAGNOSIS — R279 Unspecified lack of coordination: Secondary | ICD-10-CM | POA: Insufficient documentation

## 2022-06-15 NOTE — Patient Instructions (Signed)
Bowel massage: To assist with more regular and more comfortable bowel movements, try performing bowel massage nightly for 5-10 minutes. Place hands in the lower right side of your abdomen to start; in small circles, massage up, across, and down the left side of your abdomen. Pressure does not need to be hard, but just comfortable. You can use lotion or oil to make more comfortable.    [facility] [address], [place], [place] [address] Phone # [phone] Fax [phone]    [email or web address]

## 2022-06-15 NOTE — Therapy (Signed)
OUTPATIENT PHYSICAL THERAPY TREATMENT NOTE   Patient Name: Laura [name] MRN: [id number] DOB:February 23, 1946, 76 y.o., female Today's Date: 06/15/2022  PCP: Shirline Frees, MD REFERRING PROVIDER: Tomasa Blase, NP  END OF SESSION:   PT End of Session - 06/15/22 1417     Visit Number 2    Date for PT Re-Evaluation 07/13/22    Authorization Type Medicare    PT Start Time 1433    PT Stop Time 1525    PT Time Calculation (min) 52 min    Activity Tolerance Patient tolerated treatment well    Behavior During Therapy WFL for tasks assessed/performed             Past Medical History:  Diagnosis Date   Anemia    Anxiety    Asthma    related to sesonal allergies   Cancer ([place])    Chronic combined systolic and diastolic CHF, NYHA class 2 ([place]) CARDIOLOGIST-  DR [id number]   Coronary artery disease    Depression    History of kidney stones    History of non-ST elevation myocardial infarction (NSTEMI) 11/27/2009   SECONDARY TO TAKOTSUDO SYNDROME (CARDIAC CATH NORMAL)   Hyperlipemia    Hypertension    Hypoglycemia    Hypothyroidism    LBBB (left bundle branch block)    Left ventricular ejection fraction less than 40%    38% PER CARDIOLOGIST NOTE (DR CROITORU)   Memory loss    Mood swings    Myocardial infarction ([place]) 2011   Nonischemic dilated cardiomyopathy ([place])    MODERATELY DEPRESSED LVF;EF 35-45% by Echo 05/27/11   OSA (obstructive sleep apnea) MODERATE PER STUDY 2005   CPAP NONCOMPLIANT   Pre-diabetes    Seasonal allergies    SUI (stress urinary incontinence, female)    Past Surgical History:  Procedure Laterality Date   ABDOMINAL HYSTERECTOMY  06/30/1983   partial   CARDIAC CATHETERIZATION  09-04-1999;  08/25/2004;   12/09/2009  DR CROITORU   NORMAL CORONARIES/  APICAL BALLOONING OF LV CONSISTENT WITH TAKOTSUBO SYMPTOMS/ EF 30-35%   CATARACT EXTRACTION W/ INTRAOCULAR LENS  IMPLANT, BILATERAL     CHOLECYSTECTOMY N/A 09/29/2012   Procedure: LAPAROSCOPIC  CHOLECYSTECTOMY WITH INTRAOPERATIVE CHOLANGIOGRAM;  Surgeon: Adin Hector, MD;  Location: [facility];  Service: General;  Laterality: N/A;   COLONOSCOPY  07/2020   CYSTOSCOPY N/A 09/19/2012   Procedure: Erlene Quan;  Surgeon: Bernestine Amass, MD;  Location: [facility];  Service: Urology;  Laterality: N/A;   DILATION AND CURETTAGE OF UTERUS     EYE SURGERY Bilateral    cataract removal   INTERCOSTAL NERVE BLOCK  05/30/2021   Procedure: INTERCOSTAL NERVE BLOCK;  Surgeon: Melrose Nakayama, MD;  Location: [facility];  Service: Thoracic;;   KNEE ARTHROSCOPY W/ MENISCECTOMY  07/27/2011   MEDIAL AND LATERAL   LOBECTOMY  05/30/2021   Procedure: LEFT LOWER LOBECTOMY;  Surgeon: Melrose Nakayama, MD;  Location: [facility];  Service: Thoracic;;   LYMPH NODE DISSECTION  05/30/2021   Procedure: LYMPH NODE DISSECTION;  Surgeon: Melrose Nakayama, MD;  Location: [facility];  Service: Thoracic;;   NASAL SEPTUM SURGERY  02/28/1979   PUBOVAGINAL SLING N/A 09/19/2012   Procedure: SUBURETHRAL Janyth Pupa;  Surgeon: Bernestine Amass, MD;  Location: [facility];  Service: Urology;  Laterality: N/A;   RIGHT URETEROSCOPIC STONE EXTRACTION  08/31/2000   TRANSTHORACIC ECHOCARDIOGRAM  05-27-2011  DR CROITORU   MODERATELY DEPRESSED LVF DUE TO GLOBAL HYPOKINESIS AND  MARKED SYSTOLIC ASYNCHRONY/ EF 51%/ MILD LEFT ATRIAL DILATATION   Patient Active Problem List   Diagnosis Date Noted   Paresthesia 05/28/2022   Chronic low back pain 05/28/2022   Pulmonary embolus, right ([place]) 01/19/2022   Primary adenocarcinoma of lower lobe of left lung ([place]) 07/10/2021   S/P lobectomy of lung 05/30/2021   Multiple pulmonary nodules determined by computed tomography of lung 04/18/2021   GERD (gastroesophageal reflux disease) 01/20/2019   Abnormal chest x-ray 10/20/2018   Cough variant asthma vs UACS/vcd 09/19/2018   DOE (dyspnea on exertion) 09/19/2018   Memory loss 10/14/2015   Mixed hyperlipidemia  08/23/2014   Major depressive disorder, recurrent episode, severe ([place]) 09/22/2013   Vitamin D Deficiency 07/28/2013   Medication management 07/28/2013   Prediabetes 07/28/2013   Obstructive sleep apnea 05/27/2013   Arrhythmia 04/14/2013   Palpitations 03/12/2013   Delayed gastric emptying 10/18/2012   Steatohepatitis, nonalcoholic 70/06/7492   Female stress incontinence 09/19/2012   Takotsubo syndrome, June 2011.(normal coronaries) 09/12/2012   Cardiomyopathy- EF 45-50% by echo 09/26/12 09/12/2012   Anxiety disorder  09/12/2012   Essential hypertension 09/12/2012   LBBB (left bundle branch block) 09/12/2012   Obesity 09/12/2012   Sleep apnea- non compliant with C-pap 09/12/2012    REFERRING DIAG: K59.00 (ICD-10-CM) - CN (constipation) M62.89 (ICD-10-CM) - PFD (pelvic floor dysfunction)5 N39.3 (ICD-10-CM) - Stress incontinence  THERAPY DIAG:  Abnormal posture  Muscle weakness (generalized)  Unspecified lack of coordination  Rationale for Evaluation and Treatment Rehabilitation  PERTINENT HISTORY: G4P2, Hx lung cancer with lobectomy (05/2021), cardiomyopathy and CHF, pubovaginal sling 2014  PRECAUTIONS: NA  SUBJECTIVE:                                                                                                                                                                                      SUBJECTIVE STATEMENT:  Pt states that she is not doing well. She is taking metamucil and prune juice to help with constipation. She reports sometimes that she cannot get bowel movement to break off and she has to use toilet paper to remove - she is not able to use bathroom normally. She is very nervous about being on pain medication; she feels like they are keeping her nauseated.  Pt reported her feelings of suicide this session. She states that once her husband is gone (he is living and in good health right now), she knows where the guns are and she will kill herself. She reports  feeling like she is in so much pain there is no purpose anymore. She feels unable to do anything that she used to. She tries to stop the thoughts of suicide, but  she states that they just keep coming back. She feels like nobody will help her. Pt states adamantly that she does not want this information shared with anyone, including her primary care provider. Pt feels as if her primary care provider does not care and has managed her issues poorly - does not want for PT to reach out to him for resources on managing these feelings when offered. She states that she has no plan and does not intend on harming herself in the immediate future.   Since she reports no immediate danger to herself, I am following procedures and documenting the information but not calling her referring doctor per her request. I will perform formal suicide screen next visit. This note has been blocked from patient's view due to safety reasons as indicated above.    PAIN:  Are you having pain? Yes: NPRS scale: 2/10 Pain location: low back Pain description: aching Aggravating factors: constant Relieving factors: sometimes pain medication   05/18/22 SUBJECTIVE STATEMENT: Pt states that she has been sick for 3 years. She has low back pain and has to have injections; she has Rt knee pain with arthritis. Constipation started as she was going through medical dx process in 2022. She was placed on many different medications for constipation that did not do anything. She has had problems with Lt thoracic incision ever since. Urinary issues started again since constipation worsened - she did have bad incontinence but it was better after sling.  Fluid intake: Yes: -     PAIN:  Are you having pain? Yes NPRS scale: 2/10, 10/10 at worst Pain location:  low back   Pain type: aching Pain description: constant    Aggravating factors: walking,standing Relieving factors: pain medication, back brace, sleeping inclined, roll on pain  medication   PRECAUTIONS: None   WEIGHT BEARING RESTRICTIONS: No   FALLS:  Has patient fallen in last 6 months? No   LIVING ENVIRONMENT: Lives with: lives with their spouse Lives in: House/apartment     OCCUPATION: retired   PLOF: Independent   PATIENT GOALS: decrease pain, get back to enjoying life, get bowel movements under control   PERTINENT HISTORY:  G4P2, Hx lung cancer with lobectomy (05/2021), cardiomyopathy and CHF, pubovaginal sling 2014 Sexual abuse: No   BOWEL MOVEMENT: Pain with bowel movement: No Type of bowel movement:Frequency usually 1-2x/day and Strain Yes - used to go weeks at a time without having one; better with pessary and metamucil  Fully empty rectum: No Leakage: No Pads: No Fiber supplement: Yes: using metamucil *regularly using enemas to empty rectum   URINATION: Pain with urination: No Fully empty bladder: No -will void more if she sits there.  Stream: Strong and sometimes hesitant Urgency: Yes: - Frequency: a whole lot; wakes at night to go to the bathroom 2-3x; 5x/day Leakage: Urge to void, Coughing, Sneezing, Laughing, and Exercise Pads: Yes: -   INTERCOURSE: Pain with intercourse:  not currently sexually active Ability to have vaginal penetration:  No Climax: - Marinoff Scale: 3/3   PREGNANCY: Vaginal deliveries 2 Tearing Yes: - C-section deliveries 0 Currently pregnant No   PROLAPSE: Rectocele has been fit with a pessary     OBJECTIVE:  06/15/22:  PALPATION:   General  scar tissue restriction over Lt rib cage/side; decreased rib cage expansion Lt side                 External Perineal Exam pale plaques consistent with lichens sclerosis  Internal Pelvic Floor pessary present, low tone   Patient confirms identification and approves PT to assess internal pelvic floor and treatment Yes - future treatment session   PELVIC MMT:   MMT eval  Vaginal  2/5 strength with bil LE/abdominal  contraction - poor coordination of pelvic floor contraction, 6 second endurance, 3 repeat contractions  Internal Anal Sphincter    External Anal Sphincter    Puborectalis    Diastasis Recti  7 inch distortion at, above, and below umbilicus  (Blank rows = not tested)         TONE:  low   PROLAPSE: Pessary in place    05/18/22: DIAGNOSTIC FINDINGS:  CT/PET scan with lung cancer diagnosis   COGNITION: Overall cognitive status: Within functional limits for tasks assessed                          SENSATION: Light touch: Appears intact Proprioception: Appears intact   MUSCLE LENGTH:     FUNCTIONAL TESTS: 5x STS: 26 seconds;    GAIT: Comments: antalgic gait pattern, decreased trunk rotation and bil hip extension   POSTURE: rounded shoulders, forward head, decreased lumbar lordosis, posterior pelvic tilt, and right elevated iliac crest   LUMBARAROM/PROM: flexion 75%, extension 20%, Rt side bend 0% with severe pain, Lt side bend 50% with LOB backwards; difficulty with return to standing after flexion        TODAY'S TREATMENT 06/15/22:  Manual: Internal vaginal pelvic floor assessment No emotional/communication barriers or cognitive limitation. Patient is motivated to learn. Patient understands and agrees with treatment goals and plan. PT explains patient will be examined in standing, sitting, and lying down to see how their muscles and joints work. When they are ready, they will be asked to remove their underwear so PT can examine their perineum. The patient is also given the option of providing their own chaperone as one is not provided in our facility. The patient also has the right and is explained the right to defer or refuse any part of the evaluation or treatment including the internal exam. With the patient's consent, PT will use one gloved finger to gently assess the muscles of the pelvic floor, seeing how well it contracts and relaxes and if there is muscle symmetry.  After, the patient will get dressed and PT and patient will discuss exam findings and plan of care. PT and patient discuss plan of care, schedule, attendance policy and HEP activities. Bowel massage Soft tissue mobilization to lumbar paraspinals in sideling  Neuromuscular re-education: Pelvic floor contraction training Quick flicks 2 x 10 Therapeutic activities: Self-bowel massage                                                                                                                              DATE: 05/18/22  EVAL        PATIENT EDUCATION:  Education details: bowel massage Person educated: Patient Education  method: Explanation, Demonstration, Tactile cues, Verbal cues, and Handouts Education comprehension: verbalized understanding   HOME EXERCISE PROGRAM: [id number]   ASSESSMENT:   CLINICAL IMPRESSION: Exam findings notable for vaginal wall laxity, low tone, lichens sclerosis plaques, pelvic floor weakness, decreased pelvic floor endurance, and poor coordination of pelvic floor contraction. She did well with increased ability to contract pelvic floor with cues, but unable to coordinate this contraction with her breathing. When we reviewed her goals for PT, she states that she just wants to get her life back. We discussed how pelvic floor PT can help with goals, but suggested other resources, such of mental health therapist, that may also be beneficial in addition to medical management of various conditions; pt agreeable to this - will work on finding resources for her. She tolerated manual techniques to abdomen and low back well with decrease in pain during and at end of session. We discussed that pain medication and severe pain can add to difficulty with bowel movements and constipation as well as constipation causing increased back pain. Believe she will experience some relief if we can normalize bowel movements. She will continue to benefit from skilled PT intervention in order  to decrease urinary incontinence, improve constipation management, decrease low back pain, and improve QOL.    OBJECTIVE IMPAIRMENTS: decreased activity tolerance, decreased coordination, decreased endurance, decreased mobility, decreased strength, increased fascial restrictions, increased muscle spasms, impaired tone, postural dysfunction, and pain.    ACTIVITY LIMITATIONS: bending, standing, squatting, transfers, and continence   PARTICIPATION LIMITATIONS: community activity and exercise   PERSONAL FACTORS: 3+ comorbidities: G4P2, Hx lung cancer with lobectomy (05/2021), cardiomyopathy and CHF, pubovaginal sling 2014  are also affecting patient's functional outcome.    REHAB POTENTIAL: Fair due to personal factors/medical history   CLINICAL DECISION MAKING: Stable/uncomplicated   EVALUATION COMPLEXITY: Low     GOALS: Goals reviewed with patient? Yes   SHORT TERM GOALS: Target date: 06/15/22 - updated 06/15/22   Pt will be independent with HEP.    Baseline: Goal status: IN PROGRESS   2.  Pt will be independent with use of squatty potty, relaxed toileting mechanics, and improved bowel movement techniques in order to increase ease of bowel movements and complete evacuation.    Baseline:  Goal status: IN PROGRESS   3.  Pt will be independent with the knack, urge suppression technique, and double voiding in order to improve bladder habits and decrease urinary incontinence.    Baseline:  Goal status: IN PROGRESS   4.  Pt will be independent with diaphragmatic breathing and down training activities in order to improve pelvic floor relaxation.   Baseline:  Goal status: IN PROGRESS     LONG TERM GOALS: Target date: 07/13/21 - update/d   / Pt will be independent with advanced HEP.    Baseline:  Goal status: IN PROGRESS   2.  Pt will demonstrate normal pelvic floor muscle tone and A/ROM, able to achieve 3/5 strength with contractions and 10 sec endurance, in order to provide  appropriate lumbopelvic support in functional activities.    Baseline:  Goal status: IN PROGRESS   3.  Pt will report no leaks with laughing, coughing, sneezing in order to improve comfort with interpersonal relationships and community activities.    Baseline:  Goal status: IN PROGRESS   4.  Pt will have 1 bowel movement a day without straining, use of appropriate body mechanics, and complete emptying without use of enema in order to decrease low back pain, improve  urinary incontinence, and improve bowel health. Baseline:  Goal status: IN PROGRESS   5.  Pt will report decrease in highest low back pain to no greater than 6/10 and increase all lumbar A/ROM by 25% in order to improve functional ability.  Baseline:  Goal status: IN PROGRESS   6.  Pt decrease 5x STS test time by 5 seconds in order to decrease risk for falls and demonstrate improved functional ability. Baseline:  Goal status: IN PROGRESS   PLAN:   PT FREQUENCY: 1x/week   PT DURATION: 8 weeks   PLANNED INTERVENTIONS: Therapeutic exercises, Therapeutic activity, Neuromuscular re-education, Balance training, Gait training, Patient/Family education, Self Care, Joint mobilization, Dry Needling, Biofeedback, and Manual therapy   PLAN FOR NEXT SESSION: Perform suicide screen; progress pelvic floor strengthening; begin core training; mobility exercises; manual techniques as needed.    Heather Roberts, PT, DPT12/18/235:39 PM

## 2022-06-16 ENCOUNTER — Other Ambulatory Visit: Payer: Self-pay

## 2022-06-16 ENCOUNTER — Other Ambulatory Visit (HOSPITAL_COMMUNITY): Payer: Self-pay

## 2022-06-19 DIAGNOSIS — R0602 Shortness of breath: Secondary | ICD-10-CM | POA: Diagnosis not present

## 2022-06-19 DIAGNOSIS — I447 Left bundle-branch block, unspecified: Secondary | ICD-10-CM | POA: Diagnosis not present

## 2022-06-19 DIAGNOSIS — Z8616 Personal history of COVID-19: Secondary | ICD-10-CM | POA: Diagnosis not present

## 2022-06-19 DIAGNOSIS — Z20822 Contact with and (suspected) exposure to covid-19: Secondary | ICD-10-CM | POA: Diagnosis not present

## 2022-06-23 ENCOUNTER — Ambulatory Visit: Payer: Medicare Other

## 2022-06-23 DIAGNOSIS — M62838 Other muscle spasm: Secondary | ICD-10-CM | POA: Diagnosis not present

## 2022-06-23 DIAGNOSIS — R279 Unspecified lack of coordination: Secondary | ICD-10-CM

## 2022-06-23 DIAGNOSIS — M6281 Muscle weakness (generalized): Secondary | ICD-10-CM

## 2022-06-23 DIAGNOSIS — R293 Abnormal posture: Secondary | ICD-10-CM | POA: Diagnosis not present

## 2022-06-23 NOTE — Therapy (Signed)
OUTPATIENT PHYSICAL THERAPY TREATMENT NOTE   Patient Name: Laura [name] MRN: [id number] DOB:1946/02/08, 76 y.o., female Today's Date: 06/23/2022  PCP: Shirline Frees, MD REFERRING PROVIDER: Tomasa Blase, NP  END OF SESSION:   PT End of Session - 06/23/22 1401     Visit Number 3    Date for PT Re-Evaluation 07/13/22    Authorization Type Medicare    PT Start Time 1400    PT Stop Time 1440    PT Time Calculation (min) 40 min    Activity Tolerance Patient tolerated treatment well    Behavior During Therapy WFL for tasks assessed/performed              Past Medical History:  Diagnosis Date   Anemia    Anxiety    Asthma    related to sesonal allergies   Cancer ([place])    Chronic combined systolic and diastolic CHF, NYHA class 2 ([place]) CARDIOLOGIST-  DR IRCVELFY   Coronary artery disease    Depression    History of kidney stones    History of non-ST elevation myocardial infarction (NSTEMI) 11/27/2009   SECONDARY TO TAKOTSUDO SYNDROME (CARDIAC CATH NORMAL)   Hyperlipemia    Hypertension    Hypoglycemia    Hypothyroidism    LBBB (left bundle branch block)    Left ventricular ejection fraction less than 40%    38% PER CARDIOLOGIST NOTE (DR CROITORU)   Memory loss    Mood swings    Myocardial infarction ([place]) 2011   Nonischemic dilated cardiomyopathy ([place])    MODERATELY DEPRESSED LVF;EF 35-45% by Echo 05/27/11   OSA (obstructive sleep apnea) MODERATE PER STUDY 2005   CPAP NONCOMPLIANT   Pre-diabetes    Seasonal allergies    SUI (stress urinary incontinence, female)    Past Surgical History:  Procedure Laterality Date   ABDOMINAL HYSTERECTOMY  06/30/1983   partial   CARDIAC CATHETERIZATION  09-04-1999;  08/25/2004;   12/09/2009  DR CROITORU   NORMAL CORONARIES/  APICAL BALLOONING OF LV CONSISTENT WITH TAKOTSUBO SYMPTOMS/ EF 30-35%   CATARACT EXTRACTION W/ INTRAOCULAR LENS  IMPLANT, BILATERAL     CHOLECYSTECTOMY N/A 09/29/2012   Procedure:  LAPAROSCOPIC CHOLECYSTECTOMY WITH INTRAOPERATIVE CHOLANGIOGRAM;  Surgeon: Adin Hector, MD;  Location: [facility];  Service: General;  Laterality: N/A;   COLONOSCOPY  07/2020   CYSTOSCOPY N/A 09/19/2012   Procedure: Erlene Quan;  Surgeon: Bernestine Amass, MD;  Location: [facility];  Service: Urology;  Laterality: N/A;   DILATION AND CURETTAGE OF UTERUS     EYE SURGERY Bilateral    cataract removal   INTERCOSTAL NERVE BLOCK  05/30/2021   Procedure: INTERCOSTAL NERVE BLOCK;  Surgeon: Melrose Nakayama, MD;  Location: [place];  Service: Thoracic;;   KNEE ARTHROSCOPY W/ MENISCECTOMY  07/27/2011   MEDIAL AND LATERAL   LOBECTOMY  05/30/2021   Procedure: LEFT LOWER LOBECTOMY;  Surgeon: Melrose Nakayama, MD;  Location: [place];  Service: Thoracic;;   LYMPH NODE DISSECTION  05/30/2021   Procedure: LYMPH NODE DISSECTION;  Surgeon: Melrose Nakayama, MD;  Location: [place];  Service: Thoracic;;   NASAL SEPTUM SURGERY  02/28/1979   PUBOVAGINAL SLING N/A 09/19/2012   Procedure: SUBURETHRAL Janyth Pupa;  Surgeon: Bernestine Amass, MD;  Location: [facility];  Service: Urology;  Laterality: N/A;   RIGHT URETEROSCOPIC STONE EXTRACTION  08/31/2000   TRANSTHORACIC ECHOCARDIOGRAM  05-27-2011  DR CROITORU   MODERATELY DEPRESSED LVF DUE TO GLOBAL HYPOKINESIS  AND MARKED SYSTOLIC ASYNCHRONY/ EF 54%/ MILD LEFT ATRIAL DILATATION   Patient Active Problem List   Diagnosis Date Noted   Paresthesia 05/28/2022   Chronic low back pain 05/28/2022   Pulmonary embolus, right ([place]) 01/19/2022   Primary adenocarcinoma of lower lobe of left lung ([place]) 07/10/2021   S/P lobectomy of lung 05/30/2021   Multiple pulmonary nodules determined by computed tomography of lung 04/18/2021   GERD (gastroesophageal reflux disease) 01/20/2019   Abnormal chest x-ray 10/20/2018   Cough variant asthma vs UACS/vcd 09/19/2018   DOE (dyspnea on exertion) 09/19/2018   Memory loss 10/14/2015   Mixed  hyperlipidemia 08/23/2014   Major depressive disorder, recurrent episode, severe ([place]) 09/22/2013   Vitamin D Deficiency 07/28/2013   Medication management 07/28/2013   Prediabetes 07/28/2013   Obstructive sleep apnea 05/27/2013   Arrhythmia 04/14/2013   Palpitations 03/12/2013   Delayed gastric emptying 10/18/2012   Steatohepatitis, nonalcoholic 27/11/2374   Female stress incontinence 09/19/2012   Takotsubo syndrome, June 2011.(normal coronaries) 09/12/2012   Cardiomyopathy- EF 45-50% by echo 09/26/12 09/12/2012   Anxiety disorder  09/12/2012   Essential hypertension 09/12/2012   LBBB (left bundle branch block) 09/12/2012   Obesity 09/12/2012   Sleep apnea- non compliant with C-pap 09/12/2012    REFERRING DIAG: K59.00 (ICD-10-CM) - CN (constipation) M62.89 (ICD-10-CM) - PFD (pelvic floor dysfunction)5 N39.3 (ICD-10-CM) - Stress incontinence  THERAPY DIAG:  Abnormal posture  Muscle weakness (generalized)  Other muscle spasm  Unspecified lack of coordination  Rationale for Evaluation and Treatment Rehabilitation  PERTINENT HISTORY: G4P2, Hx lung cancer with lobectomy (05/2021), cardiomyopathy and CHF, pubovaginal sling 2014  PRECAUTIONS: NA  SUBJECTIVE:                                                                                                                                                                                      SUBJECTIVE STATEMENT:  Pt states that she has had 5 bowel movements since her last treatment session which she feels like is a large improvement. She has been sleeping pretty well. She did fall and hit her head the other night - she is unsure why she fell, but thinks it is due to Rt LE pain. She did not go to the doctor, but feels like she is ok.   She states that her feelings of suicide have been better this last week. She feels like going to the bathroom more regularly is really helping.    PAIN:  Are you having pain? Yes: NPRS scale:  2/10 Pain location: low back Pain description: aching Aggravating factors: constant Relieving factors: sometimes pain medication   05/18/22 SUBJECTIVE STATEMENT: Pt states that she has  been sick for 3 years. She has low back pain and has to have injections; she has Rt knee pain with arthritis. Constipation started as she was going through medical dx process in 2022. She was placed on many different medications for constipation that did not do anything. She has had problems with Lt thoracic incision ever since. Urinary issues started again since constipation worsened - she did have bad incontinence but it was better after sling.  Fluid intake: Yes: -     PAIN:  Are you having pain? Yes NPRS scale: 2/10, 10/10 at worst Pain location:  low back   Pain type: aching Pain description: constant    Aggravating factors: walking,standing Relieving factors: pain medication, back brace, sleeping inclined, roll on pain medication   PRECAUTIONS: None   WEIGHT BEARING RESTRICTIONS: No   FALLS:  Has patient fallen in last 6 months? No   LIVING ENVIRONMENT: Lives with: lives with their spouse Lives in: House/apartment     OCCUPATION: retired   PLOF: Independent   PATIENT GOALS: decrease pain, get back to enjoying life, get bowel movements under control   PERTINENT HISTORY:  G4P2, Hx lung cancer with lobectomy (05/2021), cardiomyopathy and CHF, pubovaginal sling 2014 Sexual abuse: No   BOWEL MOVEMENT: Pain with bowel movement: No Type of bowel movement:Frequency usually 1-2x/day and Strain Yes - used to go weeks at a time without having one; better with pessary and metamucil  Fully empty rectum: No Leakage: No Pads: No Fiber supplement: Yes: using metamucil *regularly using enemas to empty rectum   URINATION: Pain with urination: No Fully empty bladder: No -will void more if she sits there.  Stream: Strong and sometimes hesitant Urgency: Yes: - Frequency: a whole lot; wakes at  night to go to the bathroom 2-3x; 5x/day Leakage: Urge to void, Coughing, Sneezing, Laughing, and Exercise Pads: Yes: -   INTERCOURSE: Pain with intercourse:  not currently sexually active Ability to have vaginal penetration:  No Climax: - Marinoff Scale: 3/3   PREGNANCY: Vaginal deliveries 2 Tearing Yes: - C-section deliveries 0 Currently pregnant No   PROLAPSE: Rectocele has been fit with a pessary     OBJECTIVE:  06/15/22:  PALPATION:   General  scar tissue restriction over Lt rib cage/side; decreased rib cage expansion Lt side                 External Perineal Exam pale plaques consistent with lichens sclerosis                             Internal Pelvic Floor pessary present, low tone   Patient confirms identification and approves PT to assess internal pelvic floor and treatment Yes - future treatment session   PELVIC MMT:   MMT eval  Vaginal  2/5 strength with bil LE/abdominal contraction - poor coordination of pelvic floor contraction, 6 second endurance, 3 repeat contractions  Internal Anal Sphincter    External Anal Sphincter    Puborectalis    Diastasis Recti  7 inch distortion at, above, and below umbilicus  (Blank rows = not tested)         TONE:  low   PROLAPSE: Pessary in place    05/18/22: DIAGNOSTIC FINDINGS:  CT/PET scan with lung cancer diagnosis   COGNITION: Overall cognitive status: Within functional limits for tasks assessed  SENSATION: Light touch: Appears intact Proprioception: Appears intact   MUSCLE LENGTH:     FUNCTIONAL TESTS: 5x STS: 26 seconds;    GAIT: Comments: antalgic gait pattern, decreased trunk rotation and bil hip extension   POSTURE: rounded shoulders, forward head, decreased lumbar lordosis, posterior pelvic tilt, and right elevated iliac crest   LUMBARAROM/PROM: flexion 75%, extension 20%, Rt side bend 0% with severe pain, Lt side bend 50% with LOB backwards; difficulty with return  to standing after flexion        TODAY'S TREATMENT 06/23/22 Manual: Abdominal soft tissue mobilization Exercises: Lower trunk rotation Bent knee fall out Single knee to chest Piriformis stretch Therapeutic activities: Suicide screen - performed 06/23/2022 Suicide resources given   TREATMENT 06/15/22:  Manual: Internal vaginal pelvic floor assessment No emotional/communication barriers or cognitive limitation. Patient is motivated to learn. Patient understands and agrees with treatment goals and plan. PT explains patient will be examined in standing, sitting, and lying down to see how their muscles and joints work. When they are ready, they will be asked to remove their underwear so PT can examine their perineum. The patient is also given the option of providing their own chaperone as one is not provided in our facility. The patient also has the right and is explained the right to defer or refuse any part of the evaluation or treatment including the internal exam. With the patient's consent, PT will use one gloved finger to gently assess the muscles of the pelvic floor, seeing how well it contracts and relaxes and if there is muscle symmetry. After, the patient will get dressed and PT and patient will discuss exam findings and plan of care. PT and patient discuss plan of care, schedule, attendance policy and HEP activities. Bowel massage Soft tissue mobilization to lumbar paraspinals in sideling  Neuromuscular re-education: Pelvic floor contraction training Quick flicks 2 x 10 Therapeutic activities: Self-bowel massage                                                                                                                              DATE: 05/18/22  EVAL        PATIENT EDUCATION:  Education details: bowel massage Person educated: Patient Education method: Explanation, Demonstration, Tactile cues, Verbal cues, and Handouts Education comprehension: verbalized  understanding   HOME EXERCISE PROGRAM: [id number]   ASSESSMENT:   CLINICAL IMPRESSION: Pt is seeing progress with bowel movements and abdominal pain due to improved frequency. Suicide screen performed today; although patient is high risk, she states that she wants to live right now and will reach out if thoughts become more imminent. She did accept resources and says she will reach out for counseling. She did very well with all exercise progressions with no increase in low back or Rt knee pain; we discussed how these exercises may further help improve bowel movements. Good tolerance to abdominal soft tissue mobilization; abdominal distention still present. She will continue to benefit from  skilled PT intervention in order to decrease urinary incontinence, improve constipation management, decrease low back pain, and improve QOL.    OBJECTIVE IMPAIRMENTS: decreased activity tolerance, decreased coordination, decreased endurance, decreased mobility, decreased strength, increased fascial restrictions, increased muscle spasms, impaired tone, postural dysfunction, and pain.    ACTIVITY LIMITATIONS: bending, standing, squatting, transfers, and continence   PARTICIPATION LIMITATIONS: community activity and exercise   PERSONAL FACTORS: 3+ comorbidities: G4P2, Hx lung cancer with lobectomy (05/2021), cardiomyopathy and CHF, pubovaginal sling 2014  are also affecting patient's functional outcome.    REHAB POTENTIAL: Fair due to personal factors/medical history   CLINICAL DECISION MAKING: Stable/uncomplicated   EVALUATION COMPLEXITY: Low     GOALS: Goals reviewed with patient? Yes   SHORT TERM GOALS: Target date: 06/15/22 - updated 06/15/22   Pt will be independent with HEP.    Baseline: Goal status: IN PROGRESS   2.  Pt will be independent with use of squatty potty, relaxed toileting mechanics, and improved bowel movement techniques in order to increase ease of bowel movements and complete  evacuation.    Baseline:  Goal status: IN PROGRESS   3.  Pt will be independent with the knack, urge suppression technique, and double voiding in order to improve bladder habits and decrease urinary incontinence.    Baseline:  Goal status: IN PROGRESS   4.  Pt will be independent with diaphragmatic breathing and down training activities in order to improve pelvic floor relaxation.   Baseline:  Goal status: IN PROGRESS     LONG TERM GOALS: Target date: 07/13/21 - updated 06/15/22   Pt will be independent with advanced HEP.    Baseline:  Goal status: IN PROGRESS   2.  Pt will demonstrate normal pelvic floor muscle tone and A/ROM, able to achieve 3/5 strength with contractions and 10 sec endurance, in order to provide appropriate lumbopelvic support in functional activities.    Baseline:  Goal status: IN PROGRESS   3.  Pt will report no leaks with laughing, coughing, sneezing in order to improve comfort with interpersonal relationships and community activities.    Baseline:  Goal status: IN PROGRESS   4.  Pt will have 1 bowel movement a day without straining, use of appropriate body mechanics, and complete emptying without use of enema in order to decrease low back pain, improve urinary incontinence, and improve bowel health. Baseline:  Goal status: IN PROGRESS   5.  Pt will report decrease in highest low back pain to no greater than 6/10 and increase all lumbar A/ROM by 25% in order to improve functional ability.  Baseline:  Goal status: IN PROGRESS   6.  Pt decrease 5x STS test time by 5 seconds in order to decrease risk for falls and demonstrate improved functional ability. Baseline:  Goal status: IN PROGRESS   PLAN:   PT FREQUENCY: 1x/week   PT DURATION: 8 weeks   PLANNED INTERVENTIONS: Therapeutic exercises, Therapeutic activity, Neuromuscular re-education, Balance training, Gait training, Patient/Family education, Self Care, Joint mobilization, Dry Needling,  Biofeedback, and Manual therapy   PLAN FOR NEXT SESSION: Perform suicide screen; progress pelvic floor strengthening; begin core training; mobility exercises; manual techniques as needed.     Screening for Suicide  Answer the following questions with Yes or No and place an "x" beside the action taken.  1. Over the past two weeks, have you felt down, depressed, or hopeless?  Yes   2. Within the past two weeks, have you felt little interest or pleasure  in life?  Yes   If YES to either #1 or #2, then ask #3  3. Have you had thoughts that that life is not worth living or that you might be       better off dead?   No   If answer is NO and suspicion is low, then end   4. Over this past week, have you had any thoughts about hurting or even killing yourself?  Yes  *She states that this stays on her mind a lot. Everything that is happening to her and going on in her, she has these thoughts. Her back and knee pain are a large part of this.  If NO, then end. Patient in no immediate danger   5. If so, do you believe that you intend to or will harm yourself?  Yes  She says yes, but it depends on how much progress she makes in the future.     If NO, then end. Patient in no immediate danger   6.  Do you have a plan as to how you would hurt yourself?  Yes  *She states that there are guns in the house, but the easiest way that can not be taken away from her are the stairs and that she will throw herself down the stairs.   7.  Over this past week, have you actually done anything to hurt yourself? No  *Patient states that she does want to live at this time.   IF YES answers to either #4, #5, #6 or #7, then patient is AT RISK for suicide   Actions Taken  ____  Screening negative; no further action required  ____  Screening positive; no immediate danger and patient already in treatment with a  mental health provider. Advise patient to speak to their mental health provider.  _X_  Screening  positive; no immediate danger. Patient advised to contact a mental  health provider for further assessment.   ____  Screening positive; in immediate danger as patient states intention of killing self,  has plan and a sense of imminence. Do not leave alone. Seek permission from  patient to contact a family member to inform them. Direct patient to go to ED.  *Patient states that she wants to live right now and is not in danger of hurting herself. She was given national suicide help line number and counseling resources to call. She was encouraged to call for counseling services. She continue to communicate to PT that she does not want PCP contacted.    Heather Roberts, PT, DPT12/26/232:47 PM

## 2022-06-25 DIAGNOSIS — Z86711 Personal history of pulmonary embolism: Secondary | ICD-10-CM | POA: Diagnosis not present

## 2022-06-25 DIAGNOSIS — R7303 Prediabetes: Secondary | ICD-10-CM | POA: Diagnosis not present

## 2022-06-25 DIAGNOSIS — I10 Essential (primary) hypertension: Secondary | ICD-10-CM | POA: Diagnosis not present

## 2022-06-25 DIAGNOSIS — F411 Generalized anxiety disorder: Secondary | ICD-10-CM | POA: Diagnosis not present

## 2022-06-25 DIAGNOSIS — M545 Low back pain, unspecified: Secondary | ICD-10-CM | POA: Diagnosis not present

## 2022-06-25 DIAGNOSIS — Z85118 Personal history of other malignant neoplasm of bronchus and lung: Secondary | ICD-10-CM | POA: Diagnosis not present

## 2022-06-25 DIAGNOSIS — E7800 Pure hypercholesterolemia, unspecified: Secondary | ICD-10-CM | POA: Diagnosis not present

## 2022-06-25 DIAGNOSIS — G8929 Other chronic pain: Secondary | ICD-10-CM | POA: Diagnosis not present

## 2022-06-25 DIAGNOSIS — G629 Polyneuropathy, unspecified: Secondary | ICD-10-CM | POA: Diagnosis not present

## 2022-06-25 DIAGNOSIS — E039 Hypothyroidism, unspecified: Secondary | ICD-10-CM | POA: Diagnosis not present

## 2022-06-25 DIAGNOSIS — R252 Cramp and spasm: Secondary | ICD-10-CM | POA: Diagnosis not present

## 2022-06-30 ENCOUNTER — Ambulatory Visit: Payer: Medicare Other | Attending: Student

## 2022-06-30 DIAGNOSIS — M62838 Other muscle spasm: Secondary | ICD-10-CM | POA: Diagnosis not present

## 2022-06-30 DIAGNOSIS — R279 Unspecified lack of coordination: Secondary | ICD-10-CM | POA: Diagnosis not present

## 2022-06-30 DIAGNOSIS — R293 Abnormal posture: Secondary | ICD-10-CM | POA: Insufficient documentation

## 2022-06-30 DIAGNOSIS — M6281 Muscle weakness (generalized): Secondary | ICD-10-CM | POA: Diagnosis not present

## 2022-06-30 NOTE — Therapy (Signed)
OUTPATIENT PHYSICAL THERAPY TREATMENT NOTE   Patient Name: Laura [name] MRN: [id number] DOB:1946/03/12, 77 y.o., female Today's Date: 06/30/2022  PCP: Shirline Frees, MD REFERRING PROVIDER: Tomasa Blase, NP  END OF SESSION:   PT End of Session - 06/30/22 1439     Visit Number 4    Date for PT Re-Evaluation 07/13/22    Authorization Type Medicare    PT Start Time 1405    PT Stop Time 6644    PT Time Calculation (min) 40 min    Activity Tolerance Patient tolerated treatment well    Behavior During Therapy WFL for tasks assessed/performed               Past Medical History:  Diagnosis Date   Anemia    Anxiety    Asthma    related to sesonal allergies   Cancer ([place])    Chronic combined systolic and diastolic CHF, NYHA class 2 ([place]) CARDIOLOGIST-  DR [id number]   Coronary artery disease    Depression    History of kidney stones    History of non-ST elevation myocardial infarction (NSTEMI) 11/27/2009   SECONDARY TO TAKOTSUDO SYNDROME (CARDIAC CATH NORMAL)   Hyperlipemia    Hypertension    Hypoglycemia    Hypothyroidism    LBBB (left bundle branch block)    Left ventricular ejection fraction less than 40%    38% PER CARDIOLOGIST NOTE (DR CROITORU)   Memory loss    Mood swings    Myocardial infarction ([place]) 2011   Nonischemic dilated cardiomyopathy ([place])    MODERATELY DEPRESSED LVF;EF 35-45% by Echo 05/27/11   OSA (obstructive sleep apnea) MODERATE PER STUDY 2005   CPAP NONCOMPLIANT   Pre-diabetes    Seasonal allergies    SUI (stress urinary incontinence, female)    Past Surgical History:  Procedure Laterality Date   ABDOMINAL HYSTERECTOMY  06/30/1983   partial   CARDIAC CATHETERIZATION  09-04-1999;  08/25/2004;   12/09/2009  DR CROITORU   NORMAL CORONARIES/  APICAL BALLOONING OF LV CONSISTENT WITH TAKOTSUBO SYMPTOMS/ EF 30-35%   CATARACT EXTRACTION W/ INTRAOCULAR LENS  IMPLANT, BILATERAL     CHOLECYSTECTOMY N/A 09/29/2012   Procedure:  LAPAROSCOPIC CHOLECYSTECTOMY WITH INTRAOPERATIVE CHOLANGIOGRAM;  Surgeon: Adin Hector, MD;  Location: [facility];  Service: General;  Laterality: N/A;   COLONOSCOPY  07/2020   CYSTOSCOPY N/A 09/19/2012   Procedure: Erlene Quan;  Surgeon: Bernestine Amass, MD;  Location: [facility];  Service: Urology;  Laterality: N/A;   DILATION AND CURETTAGE OF UTERUS     EYE SURGERY Bilateral    cataract removal   INTERCOSTAL NERVE BLOCK  05/30/2021   Procedure: INTERCOSTAL NERVE BLOCK;  Surgeon: Melrose Nakayama, MD;  Location: [facility];  Service: Thoracic;;   KNEE ARTHROSCOPY W/ MENISCECTOMY  07/27/2011   MEDIAL AND LATERAL   LOBECTOMY  05/30/2021   Procedure: LEFT LOWER LOBECTOMY;  Surgeon: Melrose Nakayama, MD;  Location: [facility];  Service: Thoracic;;   LYMPH NODE DISSECTION  05/30/2021   Procedure: LYMPH NODE DISSECTION;  Surgeon: Melrose Nakayama, MD;  Location: [facility];  Service: Thoracic;;   NASAL SEPTUM SURGERY  02/28/1979   PUBOVAGINAL SLING N/A 09/19/2012   Procedure: SUBURETHRAL Janyth Pupa;  Surgeon: Bernestine Amass, MD;  Location: [facility];  Service: Urology;  Laterality: N/A;   RIGHT URETEROSCOPIC STONE EXTRACTION  08/31/2000   TRANSTHORACIC ECHOCARDIOGRAM  05-27-2011  DR CROITORU   MODERATELY DEPRESSED LVF DUE TO GLOBAL  HYPOKINESIS AND MARKED SYSTOLIC ASYNCHRONY/ EF 73%/ MILD LEFT ATRIAL DILATATION   Patient Active Problem List   Diagnosis Date Noted   Paresthesia 05/28/2022   Chronic low back pain 05/28/2022   Pulmonary embolus, right ([place]) 01/19/2022   Primary adenocarcinoma of lower lobe of left lung ([place]) 07/10/2021   S/P lobectomy of lung 05/30/2021   Multiple pulmonary nodules determined by computed tomography of lung 04/18/2021   GERD (gastroesophageal reflux disease) 01/20/2019   Abnormal chest x-ray 10/20/2018   Cough variant asthma vs UACS/vcd 09/19/2018   DOE (dyspnea on exertion) 09/19/2018   Memory loss 10/14/2015   Mixed  hyperlipidemia 08/23/2014   Major depressive disorder, recurrent episode, severe ([place]) 09/22/2013   Vitamin D Deficiency 07/28/2013   Medication management 07/28/2013   Prediabetes 07/28/2013   Obstructive sleep apnea 05/27/2013   Arrhythmia 04/14/2013   Palpitations 03/12/2013   Delayed gastric emptying 10/18/2012   Steatohepatitis, nonalcoholic 22/07/5425   Female stress incontinence 09/19/2012   Takotsubo syndrome, June 2011.(normal coronaries) 09/12/2012   Cardiomyopathy- EF 45-50% by echo 09/26/12 09/12/2012   Anxiety disorder  09/12/2012   Essential hypertension 09/12/2012   LBBB (left bundle branch block) 09/12/2012   Obesity 09/12/2012   Sleep apnea- non compliant with C-pap 09/12/2012    REFERRING DIAG: K59.00 (ICD-10-CM) - CN (constipation) M62.89 (ICD-10-CM) - PFD (pelvic floor dysfunction)5 N39.3 (ICD-10-CM) - Stress incontinence  THERAPY DIAG:  Abnormal posture  Muscle weakness (generalized)  Other muscle spasm  Unspecified lack of coordination  Rationale for Evaluation and Treatment Rehabilitation  PERTINENT HISTORY: G4P2, Hx lung cancer with lobectomy (05/2021), cardiomyopathy and CHF, pubovaginal sling 2014  PRECAUTIONS: NA  SUBJECTIVE:                                                                                                                                                                                      SUBJECTIVE STATEMENT:  Pt states that she was very sore day after doing exercises at home last week. She described this pain as cramping and she was unable to walk. She has fallen twice in the last 2 weeks. She was also diagnosed with COVID on the 28th. She has been performing regular bowel massage and thinks it is very helpful. She has had 3 bowel movements since leaving here last session, but one was very painful. She still has difficulty getting bowel movement to detach when on toilet.    PAIN:  Are you having pain? Yes: NPRS scale: 2/10 Pain  location: low back Pain description: aching Aggravating factors: constant Relieving factors: sometimes pain medication   05/18/22 SUBJECTIVE STATEMENT: Pt states that she has been sick for 3 years. She has  low back pain and has to have injections; she has Rt knee pain with arthritis. Constipation started as she was going through medical dx process in 2022. She was placed on many different medications for constipation that did not do anything. She has had problems with Lt thoracic incision ever since. Urinary issues started again since constipation worsened - she did have bad incontinence but it was better after sling.  Fluid intake: Yes: -     PAIN:  Are you having pain? Yes NPRS scale: 2/10, 10/10 at worst Pain location:  low back   Pain type: aching Pain description: constant    Aggravating factors: walking,standing Relieving factors: pain medication, back brace, sleeping inclined, roll on pain medication   PRECAUTIONS: None   WEIGHT BEARING RESTRICTIONS: No   FALLS:  Has patient fallen in last 6 months? No   LIVING ENVIRONMENT: Lives with: lives with their spouse Lives in: House/apartment     OCCUPATION: retired   PLOF: Independent   PATIENT GOALS: decrease pain, get back to enjoying life, get bowel movements under control   PERTINENT HISTORY:  G4P2, Hx lung cancer with lobectomy (05/2021), cardiomyopathy and CHF, pubovaginal sling 2014 Sexual abuse: No   BOWEL MOVEMENT: Pain with bowel movement: No Type of bowel movement:Frequency usually 1-2x/day and Strain Yes - used to go weeks at a time without having one; better with pessary and metamucil  Fully empty rectum: No Leakage: No Pads: No Fiber supplement: Yes: using metamucil *regularly using enemas to empty rectum   URINATION: Pain with urination: No Fully empty bladder: No -will void more if she sits there.  Stream: Strong and sometimes hesitant Urgency: Yes: - Frequency: a whole lot; wakes at night to  go to the bathroom 2-3x; 5x/day Leakage: Urge to void, Coughing, Sneezing, Laughing, and Exercise Pads: Yes: -   INTERCOURSE: Pain with intercourse:  not currently sexually active Ability to have vaginal penetration:  No Climax: - Marinoff Scale: 3/3   PREGNANCY: Vaginal deliveries 2 Tearing Yes: - C-section deliveries 0 Currently pregnant No   PROLAPSE: Rectocele has been fit with a pessary     OBJECTIVE:  06/15/22:  PALPATION:   General  scar tissue restriction over Lt rib cage/side; decreased rib cage expansion Lt side                 External Perineal Exam pale plaques consistent with lichens sclerosis                             Internal Pelvic Floor pessary present, low tone   Patient confirms identification and approves PT to assess internal pelvic floor and treatment Yes - future treatment session   PELVIC MMT:   MMT eval  Vaginal  2/5 strength with bil LE/abdominal contraction - poor coordination of pelvic floor contraction, 6 second endurance, 3 repeat contractions  Internal Anal Sphincter    External Anal Sphincter    Puborectalis    Diastasis Recti  7 inch distortion at, above, and below umbilicus  (Blank rows = not tested)         TONE:  low   PROLAPSE: Pessary in place    05/18/22: DIAGNOSTIC FINDINGS:  CT/PET scan with lung cancer diagnosis   COGNITION: Overall cognitive status: Within functional limits for tasks assessed                          SENSATION: Light  touch: Appears intact Proprioception: Appears intact   MUSCLE LENGTH:     FUNCTIONAL TESTS: 5x STS: 26 seconds;    GAIT: Comments: antalgic gait pattern, decreased trunk rotation and bil hip extension   POSTURE: rounded shoulders, forward head, decreased lumbar lordosis, posterior pelvic tilt, and right elevated iliac crest   LUMBARAROM/PROM: flexion 75%, extension 20%, Rt side bend 0% with severe pain, Lt side bend 50% with LOB backwards; difficulty with return to  standing after flexion        TODAY'S TREATMENT 06/30/22: Manual: Abdominal soft tissue mobilization Exercises: Seated hip adduction ball squeeze Seated hip abduction green band Therapeutic activities: Splinting with bowel movements Pelvic floor anatomy  Pressure management Avoiding enemas  Continuing to perform HEP/severe weakness most likely not due to stretches   TREATMENT 06/23/22 Manual: Abdominal soft tissue mobilization Exercises: Lower trunk rotation Bent knee fall out Single knee to chest Piriformis stretch Therapeutic activities: Suicide screen - performed 06/23/2022 Suicide resources given   TREATMENT 06/15/22:  Manual: Internal vaginal pelvic floor assessment No emotional/communication barriers or cognitive limitation. Patient is motivated to learn. Patient understands and agrees with treatment goals and plan. PT explains patient will be examined in standing, sitting, and lying down to see how their muscles and joints work. When they are ready, they will be asked to remove their underwear so PT can examine their perineum. The patient is also given the option of providing their own chaperone as one is not provided in our facility. The patient also has the right and is explained the right to defer or refuse any part of the evaluation or treatment including the internal exam. With the patient's consent, PT will use one gloved finger to gently assess the muscles of the pelvic floor, seeing how well it contracts and relaxes and if there is muscle symmetry. After, the patient will get dressed and PT and patient will discuss exam findings and plan of care. PT and patient discuss plan of care, schedule, attendance policy and HEP activities. Bowel massage Soft tissue mobilization to lumbar paraspinals in sideling  Neuromuscular re-education: Pelvic floor contraction training Quick flicks 2 x 10 Therapeutic activities: Self-bowel massage      PATIENT EDUCATION:   Education details: bowel massage Person educated: Patient Education method: Explanation, Demonstration, Tactile cues, Verbal cues, and Handouts Education comprehension: verbalized understanding   HOME EXERCISE PROGRAM: [id number]   ASSESSMENT:   CLINICAL IMPRESSION: Believe that extreme soreness in bil LE after doing exercises due to a combination of getting down and up from the floor, having done them while having COVID, and increase in activity. We discussed importance of continuing exercise in order to build up endurance, but it is ok to perform exercises in bed for improved comfort. Good tolerance to abdominal massage. Due to report of difficulty getting bowel movement out, she may have rectocele/posterior vaginal wall laxity - we are planning to perform pelvic exam next week to confirm. We discussed splinting and how to do this to see if it is helpful with ease of bowel movements. She was also encouraged to only perform enemas if absolutely necessary for comfort as these can decrease normal sensitivity of anus and bowel movements. Good tolerance to gentle seated strengthening exercises with breath coordination. She will continue to benefit from skilled PT intervention in order to decrease urinary incontinence, improve constipation management, decrease low back pain, and improve QOL.    OBJECTIVE IMPAIRMENTS: decreased activity tolerance, decreased coordination, decreased endurance, decreased mobility, decreased strength, increased fascial restrictions,  increased muscle spasms, impaired tone, postural dysfunction, and pain.    ACTIVITY LIMITATIONS: bending, standing, squatting, transfers, and continence   PARTICIPATION LIMITATIONS: community activity and exercise   PERSONAL FACTORS: 3+ comorbidities: G4P2, Hx lung cancer with lobectomy (05/2021), cardiomyopathy and CHF, pubovaginal sling 2014  are also affecting patient's functional outcome.    REHAB POTENTIAL: Fair due to personal  factors/medical history   CLINICAL DECISION MAKING: Stable/uncomplicated   EVALUATION COMPLEXITY: Low     GOALS: Goals reviewed with patient? Yes   SHORT TERM GOALS: Target date: 06/15/22 - updated 06/15/22   Pt will be independent with HEP.    Baseline: Goal status: IN PROGRESS   2.  Pt will be independent with use of squatty potty, relaxed toileting mechanics, and improved bowel movement techniques in order to increase ease of bowel movements and complete evacuation.    Baseline:  Goal status: IN PROGRESS   3.  Pt will be independent with the knack, urge suppression technique, and double voiding in order to improve bladder habits and decrease urinary incontinence.    Baseline:  Goal status: IN PROGRESS   4.  Pt will be independent with diaphragmatic breathing and down training activities in order to improve pelvic floor relaxation.   Baseline:  Goal status: IN PROGRESS     LONG TERM GOALS: Target date: 07/13/21 - updated 06/15/22   Pt will be independent with advanced HEP.    Baseline:  Goal status: IN PROGRESS   2.  Pt will demonstrate normal pelvic floor muscle tone and A/ROM, able to achieve 3/5 strength with contractions and 10 sec endurance, in order to provide appropriate lumbopelvic support in functional activities.    Baseline:  Goal status: IN PROGRESS   3.  Pt will report no leaks with laughing, coughing, sneezing in order to improve comfort with interpersonal relationships and community activities.    Baseline:  Goal status: IN PROGRESS   4.  Pt will have 1 bowel movement a day without straining, use of appropriate body mechanics, and complete emptying without use of enema in order to decrease low back pain, improve urinary incontinence, and improve bowel health. Baseline:  Goal status: IN PROGRESS   5.  Pt will report decrease in highest low back pain to no greater than 6/10 and increase all lumbar A/ROM by 25% in order to improve functional ability.   Baseline:  Goal status: IN PROGRESS   6.  Pt decrease 5x STS test time by 5 seconds in order to decrease risk for falls and demonstrate improved functional ability. Baseline:  Goal status: IN PROGRESS   PLAN:   PT FREQUENCY: 1x/week   PT DURATION: 8 weeks   PLANNED INTERVENTIONS: Therapeutic exercises, Therapeutic activity, Neuromuscular re-education, Balance training, Gait training, Patient/Family education, Self Care, Joint mobilization, Dry Needling, Biofeedback, and Manual therapy   PLAN FOR NEXT SESSION: Continue to progress gentle core/hip strengthening to tolerance. Manual techniques to low back to help improve constipation.    Heather Roberts, PT, DPT01/02/242:43 PM

## 2022-07-06 DIAGNOSIS — G8929 Other chronic pain: Secondary | ICD-10-CM | POA: Diagnosis not present

## 2022-07-06 DIAGNOSIS — M5459 Other low back pain: Secondary | ICD-10-CM | POA: Diagnosis not present

## 2022-07-06 DIAGNOSIS — M25561 Pain in right knee: Secondary | ICD-10-CM | POA: Diagnosis not present

## 2022-07-07 ENCOUNTER — Ambulatory Visit: Payer: Medicare Other

## 2022-07-07 DIAGNOSIS — M6281 Muscle weakness (generalized): Secondary | ICD-10-CM

## 2022-07-07 DIAGNOSIS — R279 Unspecified lack of coordination: Secondary | ICD-10-CM | POA: Diagnosis not present

## 2022-07-07 DIAGNOSIS — R293 Abnormal posture: Secondary | ICD-10-CM

## 2022-07-07 DIAGNOSIS — M62838 Other muscle spasm: Secondary | ICD-10-CM | POA: Diagnosis not present

## 2022-07-07 NOTE — Therapy (Signed)
OUTPATIENT PHYSICAL THERAPY TREATMENT NOTE   Patient Name: Laura [name] MRN: [id number] DOB:December 22, 1945, 77 y.o., female Today's Date: 07/07/2022  PCP: Shirline Frees, MD REFERRING PROVIDER: Tomasa Blase, NP  END OF SESSION:   PT End of Session - 07/07/22 1232     Visit Number 5    Date for PT Re-Evaluation 07/13/22    Authorization Type Medicare    PT Start Time 1230    PT Stop Time 1310    PT Time Calculation (min) 40 min    Activity Tolerance Patient tolerated treatment well    Behavior During Therapy WFL for tasks assessed/performed                Past Medical History:  Diagnosis Date   Anemia    Anxiety    Asthma    related to sesonal allergies   Cancer ([place])    Chronic combined systolic and diastolic CHF, NYHA class 2 ([place]) CARDIOLOGIST-  DR [id number]   Coronary artery disease    Depression    History of kidney stones    History of non-ST elevation myocardial infarction (NSTEMI) 11/27/2009   SECONDARY TO TAKOTSUDO SYNDROME (CARDIAC CATH NORMAL)   Hyperlipemia    Hypertension    Hypoglycemia    Hypothyroidism    LBBB (left bundle branch block)    Left ventricular ejection fraction less than 40%    38% PER CARDIOLOGIST NOTE (DR CROITORU)   Memory loss    Mood swings    Myocardial infarction ([place]) 2011   Nonischemic dilated cardiomyopathy ([place])    MODERATELY DEPRESSED LVF;EF 35-45% by Echo 05/27/11   OSA (obstructive sleep apnea) MODERATE PER STUDY 2005   CPAP NONCOMPLIANT   Pre-diabetes    Seasonal allergies    SUI (stress urinary incontinence, female)    Past Surgical History:  Procedure Laterality Date   ABDOMINAL HYSTERECTOMY  06/30/1983   partial   CARDIAC CATHETERIZATION  09-04-1999;  08/25/2004;   12/09/2009  DR CROITORU   NORMAL CORONARIES/  APICAL BALLOONING OF LV CONSISTENT WITH TAKOTSUBO SYMPTOMS/ EF 30-35%   CATARACT EXTRACTION W/ INTRAOCULAR LENS  IMPLANT, BILATERAL     CHOLECYSTECTOMY N/A 09/29/2012   Procedure:  LAPAROSCOPIC CHOLECYSTECTOMY WITH INTRAOPERATIVE CHOLANGIOGRAM;  Surgeon: Adin Hector, MD;  Location: [facility];  Service: General;  Laterality: N/A;   COLONOSCOPY  07/2020   CYSTOSCOPY N/A 09/19/2012   Procedure: Erlene Quan;  Surgeon: Bernestine Amass, MD;  Location: [facility];  Service: Urology;  Laterality: N/A;   DILATION AND CURETTAGE OF UTERUS     EYE SURGERY Bilateral    cataract removal   INTERCOSTAL NERVE BLOCK  05/30/2021   Procedure: INTERCOSTAL NERVE BLOCK;  Surgeon: Melrose Nakayama, MD;  Location: [facility];  Service: Thoracic;;   KNEE ARTHROSCOPY W/ MENISCECTOMY  07/27/2011   MEDIAL AND LATERAL   LOBECTOMY  05/30/2021   Procedure: LEFT LOWER LOBECTOMY;  Surgeon: Melrose Nakayama, MD;  Location: [facility];  Service: Thoracic;;   LYMPH NODE DISSECTION  05/30/2021   Procedure: LYMPH NODE DISSECTION;  Surgeon: Melrose Nakayama, MD;  Location: [facility];  Service: Thoracic;;   NASAL SEPTUM SURGERY  02/28/1979   PUBOVAGINAL SLING N/A 09/19/2012   Procedure: SUBURETHRAL Janyth Pupa;  Surgeon: Bernestine Amass, MD;  Location: [facility];  Service: Urology;  Laterality: N/A;   RIGHT URETEROSCOPIC STONE EXTRACTION  08/31/2000   TRANSTHORACIC ECHOCARDIOGRAM  05-27-2011  DR CROITORU   MODERATELY DEPRESSED LVF DUE TO  GLOBAL HYPOKINESIS AND MARKED SYSTOLIC ASYNCHRONY/ EF 98%/ MILD LEFT ATRIAL DILATATION   Patient Active Problem List   Diagnosis Date Noted   Paresthesia 05/28/2022   Chronic low back pain 05/28/2022   Pulmonary embolus, right ([place]) 01/19/2022   Primary adenocarcinoma of lower lobe of left lung ([place]) 07/10/2021   S/P lobectomy of lung 05/30/2021   Multiple pulmonary nodules determined by computed tomography of lung 04/18/2021   GERD (gastroesophageal reflux disease) 01/20/2019   Abnormal chest x-ray 10/20/2018   Cough variant asthma vs UACS/vcd 09/19/2018   DOE (dyspnea on exertion) 09/19/2018   Memory loss 10/14/2015   Mixed  hyperlipidemia 08/23/2014   Major depressive disorder, recurrent episode, severe ([place]) 09/22/2013   Vitamin D Deficiency 07/28/2013   Medication management 07/28/2013   Prediabetes 07/28/2013   Obstructive sleep apnea 05/27/2013   Arrhythmia 04/14/2013   Palpitations 03/12/2013   Delayed gastric emptying 10/18/2012   Steatohepatitis, nonalcoholic 33/82/5053   Female stress incontinence 09/19/2012   Takotsubo syndrome, June 2011.(normal coronaries) 09/12/2012   Cardiomyopathy- EF 45-50% by echo 09/26/12 09/12/2012   Anxiety disorder  09/12/2012   Essential hypertension 09/12/2012   LBBB (left bundle branch block) 09/12/2012   Obesity 09/12/2012   Sleep apnea- non compliant with C-pap 09/12/2012    REFERRING DIAG: K59.00 (ICD-10-CM) - CN (constipation) M62.89 (ICD-10-CM) - PFD (pelvic floor dysfunction)5 N39.3 (ICD-10-CM) - Stress incontinence  THERAPY DIAG:  Abnormal posture  Muscle weakness (generalized)  Other muscle spasm  Unspecified lack of coordination  Rationale for Evaluation and Treatment Rehabilitation  PERTINENT HISTORY: G4P2, Hx lung cancer with lobectomy (05/2021), cardiomyopathy and CHF, pubovaginal sling 2014  PRECAUTIONS: NA  SUBJECTIVE:                                                                                                                                                                                      SUBJECTIVE STATEMENT:  Pt states that she is scheduled for injection in low back on 07/23/22 and is going to be starting cymbalta. She has had 4-5 bowel movements since her last visit without use of enema and without straining. She is waking up at night due to low back pain. Pt did fall several days ago; she denies hitting her head or having any pain other than laceration on hand. She did not go to he doctor for fall.     PAIN:  Are you having pain? Yes: NPRS scale: 2/10 Pain location: low back Pain description: aching Aggravating factors:  constant Relieving factors: sometimes pain medication   05/18/22 SUBJECTIVE STATEMENT: Pt states that she has been sick for 3 years. She has low back pain and has to have  injections; she has Rt knee pain with arthritis. Constipation started as she was going through medical dx process in 2022. She was placed on many different medications for constipation that did not do anything. She has had problems with Lt thoracic incision ever since. Urinary issues started again since constipation worsened - she did have bad incontinence but it was better after sling.  Fluid intake: Yes: -     PAIN:  Are you having pain? Yes NPRS scale: 2/10, 10/10 at worst Pain location:  low back   Pain type: aching Pain description: constant    Aggravating factors: walking,standing Relieving factors: pain medication, back brace, sleeping inclined, roll on pain medication   PRECAUTIONS: None   WEIGHT BEARING RESTRICTIONS: No   FALLS:  Has patient fallen in last 6 months? No   LIVING ENVIRONMENT: Lives with: lives with their spouse Lives in: House/apartment     OCCUPATION: retired   PLOF: Independent   PATIENT GOALS: decrease pain, get back to enjoying life, get bowel movements under control   PERTINENT HISTORY:  G4P2, Hx lung cancer with lobectomy (05/2021), cardiomyopathy and CHF, pubovaginal sling 2014 Sexual abuse: No   BOWEL MOVEMENT: Pain with bowel movement: No Type of bowel movement:Frequency usually 1-2x/day and Strain Yes - used to go weeks at a time without having one; better with pessary and metamucil  Fully empty rectum: No Leakage: No Pads: No Fiber supplement: Yes: using metamucil *regularly using enemas to empty rectum   URINATION: Pain with urination: No Fully empty bladder: No -will void more if she sits there.  Stream: Strong and sometimes hesitant Urgency: Yes: - Frequency: a whole lot; wakes at night to go to the bathroom 2-3x; 5x/day Leakage: Urge to void, Coughing,  Sneezing, Laughing, and Exercise Pads: Yes: -   INTERCOURSE: Pain with intercourse:  not currently sexually active Ability to have vaginal penetration:  No Climax: - Marinoff Scale: 3/3   PREGNANCY: Vaginal deliveries 2 Tearing Yes: - C-section deliveries 0 Currently pregnant No   PROLAPSE: Rectocele has been fit with a pessary     OBJECTIVE:  06/15/22:  PALPATION:   General  scar tissue restriction over Lt rib cage/side; decreased rib cage expansion Lt side                 External Perineal Exam pale plaques consistent with lichens sclerosis                             Internal Pelvic Floor pessary present, low tone   Patient confirms identification and approves PT to assess internal pelvic floor and treatment Yes - future treatment session   PELVIC MMT:   MMT eval  Vaginal  2/5 strength with bil LE/abdominal contraction - poor coordination of pelvic floor contraction, 6 second endurance, 3 repeat contractions  Internal Anal Sphincter    External Anal Sphincter    Puborectalis    Diastasis Recti  7 inch distortion at, above, and below umbilicus  (Blank rows = not tested)         TONE:  low   PROLAPSE: Pessary in place    05/18/22: DIAGNOSTIC FINDINGS:  CT/PET scan with lung cancer diagnosis   COGNITION: Overall cognitive status: Within functional limits for tasks assessed                          SENSATION: Light touch: Appears intact Proprioception: Appears intact  MUSCLE LENGTH:     FUNCTIONAL TESTS: 5x STS: 26 seconds;    GAIT: Comments: antalgic gait pattern, decreased trunk rotation and bil hip extension   POSTURE: rounded shoulders, forward head, decreased lumbar lordosis, posterior pelvic tilt, and right elevated iliac crest   LUMBARAROM/PROM: flexion 75%, extension 20%, Rt side bend 0% with severe pain, Lt side bend 50% with LOB backwards; difficulty with return to standing after flexion        TODAY'S TREATMENT  07/07/22 Exercises: Seated hip adduction ball squeeze 10x Seated hip abduction green band 10x  Seated march 2 x 10 Seated shoulder horizontal abduction yellow band 10x Seated side bending 5x bil Seated forward flexion 1 x 30 sec    TREATMENT 06/30/22: Manual: Abdominal soft tissue mobilization Exercises: Seated hip adduction ball squeeze Seated hip abduction green band Therapeutic activities: Splinting with bowel movements Pelvic floor anatomy  Pressure management Avoiding enemas  Continuing to perform HEP/severe weakness most likely not due to stretches   TREATMENT 06/23/22 Manual: Abdominal soft tissue mobilization Exercises: Lower trunk rotation Bent knee fall out Single knee to chest Piriformis stretch Therapeutic activities: Suicide screen - performed 06/23/2022 Suicide resources given  PATIENT EDUCATION:  Education details: bowel massage Person educated: Patient Education method: Explanation, Demonstration, Tactile cues, Verbal cues, and Handouts Education comprehension: verbalized understanding   HOME EXERCISE PROGRAM: [id number]   ASSESSMENT:   CLINICAL IMPRESSION: Pt overall doing much better today. She was speaking more positively about getting better. We discussion exercise being help for nervous system regulation. She did well with previous exercises and incorporating core/pelvic floor contraction into these activities. Good tolerance to addition of other core/hip strengthening with no increase in pain. No bowel massage today due to good improvements with bowel movement frequency, no straining, and regular performance on her own at home. She will continue to benefit from skilled PT intervention in order to decrease urinary incontinence, improve constipation management, decrease low back pain, and improve QOL.    OBJECTIVE IMPAIRMENTS: decreased activity tolerance, decreased coordination, decreased endurance, decreased mobility, decreased strength, increased  fascial restrictions, increased muscle spasms, impaired tone, postural dysfunction, and pain.    ACTIVITY LIMITATIONS: bending, standing, squatting, transfers, and continence   PARTICIPATION LIMITATIONS: community activity and exercise   PERSONAL FACTORS: 3+ comorbidities: G4P2, Hx lung cancer with lobectomy (05/2021), cardiomyopathy and CHF, pubovaginal sling 2014  are also affecting patient's functional outcome.    REHAB POTENTIAL: Fair due to personal factors/medical history   CLINICAL DECISION MAKING: Stable/uncomplicated   EVALUATION COMPLEXITY: Low     GOALS: Goals reviewed with patient? Yes   SHORT TERM GOALS: Target date: 06/15/22 - updated 06/15/22   Pt will be independent with HEP.    Baseline: Goal status: IN PROGRESS   2.  Pt will be independent with use of squatty potty, relaxed toileting mechanics, and improved bowel movement techniques in order to increase ease of bowel movements and complete evacuation.    Baseline:  Goal status: IN PROGRESS   3.  Pt will be independent with the knack, urge suppression technique, and double voiding in order to improve bladder habits and decrease urinary incontinence.    Baseline:  Goal status: IN PROGRESS   4.  Pt will be independent with diaphragmatic breathing and down training activities in order to improve pelvic floor relaxation.   Baseline:  Goal status: IN PROGRESS     LONG TERM GOALS: Target date: 07/13/21 - updated 06/15/22   Pt will be independent with advanced HEP.  Baseline:  Goal status: IN PROGRESS   2.  Pt will demonstrate normal pelvic floor muscle tone and A/ROM, able to achieve 3/5 strength with contractions and 10 sec endurance, in order to provide appropriate lumbopelvic support in functional activities.    Baseline:  Goal status: IN PROGRESS   3.  Pt will report no leaks with laughing, coughing, sneezing in order to improve comfort with interpersonal relationships and community activities.     Baseline:  Goal status: IN PROGRESS   4.  Pt will have 1 bowel movement a day without straining, use of appropriate body mechanics, and complete emptying without use of enema in order to decrease low back pain, improve urinary incontinence, and improve bowel health. Baseline:  Goal status: IN PROGRESS   5.  Pt will report decrease in highest low back pain to no greater than 6/10 and increase all lumbar A/ROM by 25% in order to improve functional ability.  Baseline:  Goal status: IN PROGRESS   6.  Pt decrease 5x STS test time by 5 seconds in order to decrease risk for falls and demonstrate improved functional ability. Baseline:  Goal status: IN PROGRESS   PLAN:   PT FREQUENCY: 1x/week   PT DURATION: 8 weeks   PLANNED INTERVENTIONS: Therapeutic exercises, Therapeutic activity, Neuromuscular re-education, Balance training, Gait training, Patient/Family education, Self Care, Joint mobilization, Dry Needling, Biofeedback, and Manual therapy   PLAN FOR NEXT SESSION: Continue to progress gentle core/hip strengthening to tolerance. Manual techniques to low back to help improve constipation. Re-evaluation.    Heather Roberts, PT, DPT01/09/241:18 PM

## 2022-07-14 ENCOUNTER — Ambulatory Visit: Payer: Medicare Other

## 2022-07-14 DIAGNOSIS — M62838 Other muscle spasm: Secondary | ICD-10-CM

## 2022-07-14 DIAGNOSIS — M6281 Muscle weakness (generalized): Secondary | ICD-10-CM

## 2022-07-14 DIAGNOSIS — R279 Unspecified lack of coordination: Secondary | ICD-10-CM | POA: Diagnosis not present

## 2022-07-14 DIAGNOSIS — R293 Abnormal posture: Secondary | ICD-10-CM | POA: Diagnosis not present

## 2022-07-14 NOTE — Therapy (Signed)
OUTPATIENT PHYSICAL THERAPY TREATMENT NOTE   Patient Name: Laura [name] MRN: [id number] DOB:[birth year], 77 y.o., female Today's Date: 07/14/2022  PCP: Johny Blamer, MD REFERRING PROVIDER: Lang Snow, NP  END OF SESSION:   PT End of Session - 07/14/22 1231     Visit Number 6    Date for PT Re-Evaluation 09/22/22    Authorization Type Medicare    PT Start Time 1230    PT Stop Time 1310    PT Time Calculation (min) 40 min    Activity Tolerance Patient tolerated treatment well    Behavior During Therapy WFL for tasks assessed/performed                 Past Medical History:  Diagnosis Date   Anemia    Anxiety    Asthma    related to sesonal allergies   Cancer (HCC)    Chronic combined systolic and diastolic CHF, NYHA class 2 (HCC) CARDIOLOGIST-  DR [id number]   Coronary artery disease    Depression    History of kidney stones    History of non-ST elevation myocardial infarction (NSTEMI) 11/27/2009   SECONDARY TO TAKOTSUDO SYNDROME (CARDIAC CATH NORMAL)   Hyperlipemia    Hypertension    Hypoglycemia    Hypothyroidism    LBBB (left bundle branch block)    Left ventricular ejection fraction less than 40%    38% PER CARDIOLOGIST NOTE (DR CROITORU)   Memory loss    Mood swings    Myocardial infarction (HCC) 2011   Nonischemic dilated cardiomyopathy (HCC)    MODERATELY DEPRESSED LVF;EF 35-45% by Echo 05/27/11   OSA (obstructive sleep apnea) MODERATE PER STUDY 2005   CPAP NONCOMPLIANT   Pre-diabetes    Seasonal allergies    SUI (stress urinary incontinence, female)    Past Surgical History:  Procedure Laterality Date   ABDOMINAL HYSTERECTOMY  06/30/1983   partial   CARDIAC CATHETERIZATION  09-04-1999;  08/25/2004;   12/09/2009  DR CROITORU   NORMAL CORONARIES/  APICAL BALLOONING OF LV CONSISTENT WITH TAKOTSUBO SYMPTOMS/ EF 30-35%   CATARACT EXTRACTION W/ INTRAOCULAR LENS  IMPLANT, BILATERAL     CHOLECYSTECTOMY N/A 09/29/2012   Procedure:  LAPAROSCOPIC CHOLECYSTECTOMY WITH INTRAOPERATIVE CHOLANGIOGRAM;  Surgeon: Ardeth Sportsman, MD;  Location: [facility] OR;  Service: General;  Laterality: N/A;   COLONOSCOPY  07/2020   CYSTOSCOPY N/A 09/19/2012   Procedure: Derinda Late;  Surgeon: Valetta Fuller, MD;  Location: [facility];  Service: Urology;  Laterality: N/A;   DILATION AND CURETTAGE OF UTERUS     EYE SURGERY Bilateral    cataract removal   INTERCOSTAL NERVE BLOCK  05/30/2021   Procedure: INTERCOSTAL NERVE BLOCK;  Surgeon: Loreli Slot, MD;  Location: [facility] OR;  Service: Thoracic;;   KNEE ARTHROSCOPY W/ MENISCECTOMY  07/27/2011   MEDIAL AND LATERAL   LOBECTOMY  05/30/2021   Procedure: LEFT LOWER LOBECTOMY;  Surgeon: Loreli Slot, MD;  Location: [facility] OR;  Service: Thoracic;;   LYMPH NODE DISSECTION  05/30/2021   Procedure: LYMPH NODE DISSECTION;  Surgeon: Loreli Slot, MD;  Location: [facility] OR;  Service: Thoracic;;   NASAL SEPTUM SURGERY  02/28/1979   PUBOVAGINAL SLING N/A 09/19/2012   Procedure: SUBURETHRAL Elio Forget;  Surgeon: Valetta Fuller, MD;  Location: [facility];  Service: Urology;  Laterality: N/A;   RIGHT URETEROSCOPIC STONE EXTRACTION  08/31/2000   TRANSTHORACIC ECHOCARDIOGRAM  05-27-2011  DR CROITORU   MODERATELY DEPRESSED LVF DUE  TO GLOBAL HYPOKINESIS AND MARKED SYSTOLIC ASYNCHRONY/ EF 38%/ MILD LEFT ATRIAL DILATATION   Patient Active Problem List   Diagnosis Date Noted   Paresthesia 05/28/2022   Chronic low back pain 05/28/2022   Pulmonary embolus, right (HCC) 01/19/2022   Primary adenocarcinoma of lower lobe of left lung (HCC) 07/10/2021   S/P lobectomy of lung 05/30/2021   Multiple pulmonary nodules determined by computed tomography of lung 04/18/2021   GERD (gastroesophageal reflux disease) 01/20/2019   Abnormal chest x-ray 10/20/2018   Cough variant asthma vs UACS/vcd 09/19/2018   DOE (dyspnea on exertion) 09/19/2018   Memory loss 10/14/2015   Mixed  hyperlipidemia 08/23/2014   Major depressive disorder, recurrent episode, severe (HCC) 09/22/2013   Vitamin D Deficiency 07/28/2013   Medication management 07/28/2013   Prediabetes 07/28/2013   Obstructive sleep apnea 05/27/2013   Arrhythmia 04/14/2013   Palpitations 03/12/2013   Delayed gastric emptying 10/18/2012   Steatohepatitis, nonalcoholic 09/29/2012   Female stress incontinence 09/19/2012   Takotsubo syndrome, June 2011.(normal coronaries) 09/12/2012   Cardiomyopathy- EF 45-50% by echo 09/26/12 09/12/2012   Anxiety disorder  09/12/2012   Essential hypertension 09/12/2012   LBBB (left bundle branch block) 09/12/2012   Obesity 09/12/2012   Sleep apnea- non compliant with C-pap 09/12/2012    REFERRING DIAG: K59.00 (ICD-10-CM) - CN (constipation) M62.89 (ICD-10-CM) - PFD (pelvic floor dysfunction)5 N39.3 (ICD-10-CM) - Stress incontinence  THERAPY DIAG:  Abnormal posture  Muscle weakness (generalized)  Other muscle spasm  Unspecified lack of coordination  Rationale for Evaluation and Treatment Rehabilitation  PERTINENT HISTORY: G4P2, Hx lung cancer with lobectomy (05/2021), cardiomyopathy and CHF, pubovaginal sling 2014  PRECAUTIONS: NA  SUBJECTIVE:                                                                                                                                                                                      SUBJECTIVE STATEMENT:  Pt states that she feels very tired today and almost wasn't able to make it. She has done enema 1x since being here last and only had 2 bowel movements. She has been working on bowel massage.    PAIN:  Are you having pain? Yes: NPRS scale: 2/10 Pain location: low back Pain description: aching Aggravating factors: constant Relieving factors: sometimes pain medication   05/18/22 SUBJECTIVE STATEMENT: Pt states that she has been sick for 3 years. She has low back pain and has to have injections; she has Rt knee pain  with arthritis. Constipation started as she was going through medical dx process in 2022. She was placed on many different medications for constipation that did not do anything. She has had problems with Lt  thoracic incision ever since. Urinary issues started again since constipation worsened - she did have bad incontinence but it was better after sling.  Fluid intake: Yes: -     PAIN:  Are you having pain? Yes NPRS scale: 2/10, 10/10 at worst Pain location:  low back   Pain type: aching Pain description: constant    Aggravating factors: walking,standing Relieving factors: pain medication, back brace, sleeping inclined, roll on pain medication   PRECAUTIONS: None   WEIGHT BEARING RESTRICTIONS: No   FALLS:  Has patient fallen in last 6 months? No   LIVING ENVIRONMENT: Lives with: lives with their spouse Lives in: House/apartment     OCCUPATION: retired   PLOF: Independent   PATIENT GOALS: decrease pain, get back to enjoying life, get bowel movements under control   PERTINENT HISTORY:  G4P2, Hx lung cancer with lobectomy (05/2021), cardiomyopathy and CHF, pubovaginal sling 2014 Sexual abuse: No   BOWEL MOVEMENT: Pain with bowel movement: No Type of bowel movement:Frequency usually 1-2x/day and Strain Yes - used to go weeks at a time without having one; better with pessary and metamucil  Fully empty rectum: No Leakage: No Pads: No Fiber supplement: Yes: using metamucil *regularly using enemas to empty rectum   URINATION: Pain with urination: No Fully empty bladder: No -will void more if she sits there.  Stream: Strong and sometimes hesitant Urgency: Yes: - Frequency: a whole lot; wakes at night to go to the bathroom 2-3x; 5x/day Leakage: Urge to void, Coughing, Sneezing, Laughing, and Exercise Pads: Yes: -   INTERCOURSE: Pain with intercourse:  not currently sexually active Ability to have vaginal penetration:  No Climax: - Marinoff Scale: 3/3    PREGNANCY: Vaginal deliveries 2 Tearing Yes: - C-section deliveries 0 Currently pregnant No   PROLAPSE: Rectocele has been fit with a pessary     OBJECTIVE:  07/14/22: Rt antalgic gait pattern Tendency to breath hold and bear down with activity 5x sit<>stand: 28 seconds   06/15/22: PALPATION:   General  scar tissue restriction over Lt rib cage/side; decreased rib cage expansion Lt side                 External Perineal Exam pale plaques consistent with lichens sclerosis                             Internal Pelvic Floor pessary present, low tone   Patient confirms identification and approves PT to assess internal pelvic floor and treatment Yes - future treatment session   PELVIC MMT:   MMT eval  Vaginal  2/5 strength with bil LE/abdominal contraction - poor coordination of pelvic floor contraction, 6 second endurance, 3 repeat contractions  Internal Anal Sphincter    External Anal Sphincter    Puborectalis    Diastasis Recti  7 inch distortion at, above, and below umbilicus  (Blank rows = not tested)         TONE:  low   PROLAPSE: Pessary in place    05/18/22: DIAGNOSTIC FINDINGS:  CT/PET scan with lung cancer diagnosis   COGNITION: Overall cognitive status: Within functional limits for tasks assessed                          SENSATION: Light touch: Appears intact Proprioception: Appears intact   MUSCLE LENGTH:     FUNCTIONAL TESTS: 5x STS: 26 seconds;    GAIT: Comments: antalgic gait pattern,  decreased trunk rotation and bil hip extension   POSTURE: rounded shoulders, forward head, decreased lumbar lordosis, posterior pelvic tilt, and right elevated iliac crest   LUMBARAROM/PROM: flexion 75%, extension 20%, Rt side bend 0% with severe pain, Lt side bend 50% with LOB backwards; difficulty with return to standing after flexion        TODAY'S TREATMENT 07/14/22 RE-EVALUATION Manual: Lumbar paraspinal soft tissue mobilization in side  lying Therapeutic activities: Review of body mechanics while having bowel movements Review of bowel massage Review of fiber benefit and water intake Urge suppression technique Balloon breathing Splinting during a bowel movement Double-voiding    TREATMENT 07/07/22 Exercises: Seated hip adduction ball squeeze 10x Seated hip abduction green band 10x  Seated march 2 x 10 Seated shoulder horizontal abduction yellow band 10x Seated side bending 5x bil Seated forward flexion 1 x 30 sec    TREATMENT 06/30/22: Manual: Abdominal soft tissue mobilization Exercises: Seated hip adduction ball squeeze Seated hip abduction green band Therapeutic activities: Splinting with bowel movements Pelvic floor anatomy  Pressure management Avoiding enemas  Continuing to perform HEP/severe weakness most likely not due to stretches    PATIENT EDUCATION:  Education details: see above Person educated: Patient Education method: Programmer, multimedia, Demonstration, Tactile cues, Verbal cues, and Handouts Education comprehension: verbalized understanding   HOME EXERCISE PROGRAM: [id number]   ASSESSMENT:   CLINICAL IMPRESSION: Pt is overall making good progress with decreasing abdominal pain and improving constipation. She continues to fall frequently; she has been strongly encouraged to speak with MD about this and ask for PT referral for balance/low back pain. No internal assessment performed this treatment session due to patient being highly fatigued, but we discussed doing this next time to help improve voiding mechanics. Pt is objectively about the same with measures observed today. Believe that continuing to work on reducing low back restriction/pain will be very helpful in improving constipation. There is strong relationship between her higher pain levels and weeks with more difficulty having a bowel movement. No changes to HEP at this time. She will continue to benefit from skilled PT intervention in order  to decrease urinary incontinence, improve constipation management, decrease low back pain, and improve QOL.    OBJECTIVE IMPAIRMENTS: decreased activity tolerance, decreased coordination, decreased endurance, decreased mobility, decreased strength, increased fascial restrictions, increased muscle spasms, impaired tone, postural dysfunction, and pain.    ACTIVITY LIMITATIONS: bending, standing, squatting, transfers, and continence   PARTICIPATION LIMITATIONS: community activity and exercise   PERSONAL FACTORS: 3+ comorbidities: G4P2, Hx lung cancer with lobectomy (05/2021), cardiomyopathy and CHF, pubovaginal sling 2014  are also affecting patient's functional outcome.    REHAB POTENTIAL: Fair due to personal factors/medical history   CLINICAL DECISION MAKING: Stable/uncomplicated   EVALUATION COMPLEXITY: Low     GOALS: Goals reviewed with patient? Yes   SHORT TERM GOALS: Target date: 06/15/22 - updated 06/15/22 - updated 07/14/22, new target 09/22/2022    Pt will be independent with HEP.    Baseline: Goal status: MET 07/14/22   2.  Pt will be independent with use of squatty potty, relaxed toileting mechanics, and improved bowel movement techniques in order to increase ease of bowel movements and complete evacuation.    Baseline:  Goal status: MET 07/14/22   3.  Pt will be independent with the knack, urge suppression technique, and double voiding in order to improve bladder habits and decrease urinary incontinence.    Baseline:  Goal status: MET 07/14/22   4.  Pt will be  independent with diaphragmatic breathing and down training activities in order to improve pelvic floor relaxation.   Baseline:  Goal status: MET 07/14/22     LONG TERM GOALS: Target date: 07/13/21 - updated 06/15/22 - updated 07/14/22, new target 09/22/2022    Pt will be independent with advanced HEP.    Baseline:  Goal status: IN PROGRESS   2.  Pt will demonstrate normal pelvic floor muscle tone and A/ROM,  able to achieve 3/5 strength with contractions and 10 sec endurance, in order to provide appropriate lumbopelvic support in functional activities.    Baseline:  Goal status: IN PROGRESS   3.  Pt will report no leaks with laughing, coughing, sneezing in order to improve comfort with interpersonal relationships and community activities.    Baseline: denies any stress urinary incontinence in the last several weeks Goal status: IN PROGRESS   4.  Pt will have 1 bowel movement a day without straining, use of appropriate body mechanics, and complete emptying without use of enema in order to decrease low back pain, improve urinary incontinence, and improve bowel health. Baseline: still having to use an enema occasionally with only several bowel movements a week Goal status: IN PROGRESS   5.  Pt will report decrease in highest low back pain to no greater than 6/10 and increase all lumbar A/ROM by 25% in order to improve functional ability.  Baseline: pain got up to an 8/10 over the last week  Goal status: IN PROGRESS   6.  Pt decrease 5x STS test time by 5 seconds in order to decrease risk for falls and demonstrate improved functional ability. Baseline: 28 second 07/14/22 Goal status: IN PROGRESS   PLAN:   PT FREQUENCY: 1x/week   PT DURATION: 8 weeks   PLANNED INTERVENTIONS: Therapeutic exercises, Therapeutic activity, Neuromuscular re-education, Balance training, Gait training, Patient/Family education, Self Care, Joint mobilization, Dry Needling, Biofeedback, and Manual therapy   PLAN FOR NEXT SESSION: Continue to progress gentle core/hip strengthening to tolerance. Manual techniques to low back to help improve constipation. Internal rectal exam to work on evacuation technique.    Julio Alm, PT, DPT01/16/241:19 PM

## 2022-07-14 NOTE — Patient Instructions (Signed)
Urge Incontinence  Ideal urination frequency is every 2-4 wakeful hours, which equates to 5-8 times within a 24-hour period.   Urge incontinence is leakage that occurs when the bladder muscle contracts, creating a sudden need to go before getting to the bathroom.   Going too often when your bladder isn't actually full can disrupt the body's automatic signals to store and hold urine longer, which will increase urgency/frequency.  In this case, the bladder "is running the show" and strategies can be learned to retrain this pattern.   One should be able to control the first urge to urinate, at around .  The bladder can hold up to a "grande latte," or . To help you gain control, practice the Urge Drill below when urgency strikes.  This drill will help retrain your bladder signals and allow you to store and hold urine longer.  The overall goal is to stretch out your time between voids to reach a more manageable voiding schedule.    Practice your "quick flicks" often throughout the day (each waking hour) even when you don't need feel the urge to go.  This will help strengthen your pelvic floor muscles, making them more effective in controlling leakage.  Urge Drill  When you feel an urge to go, follow these steps to regain control: Stop what you are doing and be still Take one deep breath, directing your air into your abdomen Think an affirming thought, such as "I've got this." Do 5 quick flicks of your pelvic floor Walk with control to the bathroom to void, or delay voiding   Double-voiding: This technique is to help with post-void dribbling, or leaking a little bit when you stand up right after urinating. Use relaxed toileting mechanics to urinate as much as you feel like you have to without straining. Sit back upright from leaning forward and relax this way for 10-20 seconds. Lean forward again to finish voiding any amount more.   [facility] [address], [address] [place], [place] [address] Phone # [phone] Fax [phone]

## 2022-07-15 ENCOUNTER — Telehealth: Payer: Self-pay | Admitting: Medical Oncology

## 2022-07-15 NOTE — Telephone Encounter (Signed)
Holding Eloquis-  She has a procedure 01/25 to treat nerve pain in her back .   She was told to stop Eloquis for 4 days prior to procedure.  Is it ok for her to stop Eloquis for 4 days prior to procedure?

## 2022-07-16 DIAGNOSIS — K5902 Outlet dysfunction constipation: Secondary | ICD-10-CM | POA: Diagnosis not present

## 2022-07-16 DIAGNOSIS — N816 Rectocele: Secondary | ICD-10-CM | POA: Diagnosis not present

## 2022-07-16 DIAGNOSIS — Z4689 Encounter for fitting and adjustment of other specified devices: Secondary | ICD-10-CM | POA: Diagnosis not present

## 2022-07-16 DIAGNOSIS — N3281 Overactive bladder: Secondary | ICD-10-CM | POA: Diagnosis not present

## 2022-07-16 DIAGNOSIS — N8111 Cystocele, midline: Secondary | ICD-10-CM | POA: Diagnosis not present

## 2022-07-16 DIAGNOSIS — I2699 Other pulmonary embolism without acute cor pulmonale: Secondary | ICD-10-CM | POA: Diagnosis not present

## 2022-07-17 ENCOUNTER — Telehealth: Payer: Self-pay

## 2022-07-17 NOTE — Telephone Encounter (Signed)
This nurse received a message from this patient because she is currently taking Eliquis and she is having a procedure on her back on 07/23/22 and she was advised to find out if she can hold her Eliquis for this procedures.  This nurse advised per provider it is ok for her to hold the Eliquis for 4 days prior to the procedures.  Patient acknowledges that she will not take any Eliquis beginning 07/19/22.  Patient also states that her CT was scheduled incorrectly.  She states that she told the scheduler that she was unable to come in on 1/23 but she can come on 1/24.  This nurse reached out to [employer] and had the date changed to 07/22/22 at 8 am.  Patient is aware and knows to arrive at 745 am and she is to have nothing but water 4 hours prior to her scan.  No further questions or concerns noted at this time.

## 2022-07-21 ENCOUNTER — Ambulatory Visit (HOSPITAL_COMMUNITY): Payer: Medicare Other

## 2022-07-21 ENCOUNTER — Ambulatory Visit: Payer: Medicare Other

## 2022-07-21 DIAGNOSIS — M62838 Other muscle spasm: Secondary | ICD-10-CM

## 2022-07-21 DIAGNOSIS — R279 Unspecified lack of coordination: Secondary | ICD-10-CM | POA: Diagnosis not present

## 2022-07-21 DIAGNOSIS — R293 Abnormal posture: Secondary | ICD-10-CM

## 2022-07-21 DIAGNOSIS — M6281 Muscle weakness (generalized): Secondary | ICD-10-CM

## 2022-07-21 NOTE — Patient Instructions (Signed)
Balloon Breathing: When trying to have a bowel movement, exhale and act like you're blowing up a balloon to help push. Don't just strain!!   Pelvic floor contractions: with your exhale, contract your pelvic floor like you are squeezing around my finger. Do this 10x, 2-3x/day. Lying down, seated, or standing positions.      [facility] [address] [place], [place] [address] Phone # [phone] Fax [phone]

## 2022-07-21 NOTE — Therapy (Signed)
OUTPATIENT PHYSICAL THERAPY TREATMENT NOTE   Patient Name: Laura [name] MRN: [id number] DOB:[birth year], 77 y.o., female Today's Date: 07/21/2022  PCP: Johny Blamer, MD REFERRING PROVIDER: Lang Snow, NP  END OF SESSION:   PT End of Session - 07/21/22 1233     Visit Number 7    Date for PT Re-Evaluation 09/22/22    Authorization Type Medicare    PT Start Time 1230    PT Stop Time 1310    PT Time Calculation (min) 40 min    Activity Tolerance Patient tolerated treatment well    Behavior During Therapy WFL for tasks assessed/performed                  Past Medical History:  Diagnosis Date   Anemia    Anxiety    Asthma    related to sesonal allergies   Cancer (HCC)    Chronic combined systolic and diastolic CHF, NYHA class 2 (HCC) CARDIOLOGIST-  DR [id number]   Coronary artery disease    Depression    History of kidney stones    History of non-ST elevation myocardial infarction (NSTEMI) 11/27/2009   SECONDARY TO TAKOTSUDO SYNDROME (CARDIAC CATH NORMAL)   Hyperlipemia    Hypertension    Hypoglycemia    Hypothyroidism    LBBB (left bundle branch block)    Left ventricular ejection fraction less than 40%    38% PER CARDIOLOGIST NOTE (DR CROITORU)   Memory loss    Mood swings    Myocardial infarction (HCC) 2011   Nonischemic dilated cardiomyopathy (HCC)    MODERATELY DEPRESSED LVF;EF 35-45% by Echo 05/27/11   OSA (obstructive sleep apnea) MODERATE PER STUDY 2005   CPAP NONCOMPLIANT   Pre-diabetes    Seasonal allergies    SUI (stress urinary incontinence, female)    Past Surgical History:  Procedure Laterality Date   ABDOMINAL HYSTERECTOMY  06/30/1983   partial   CARDIAC CATHETERIZATION  09-04-1999;  08/25/2004;   12/09/2009  DR CROITORU   NORMAL CORONARIES/  APICAL BALLOONING OF LV CONSISTENT WITH TAKOTSUBO SYMPTOMS/ EF 30-35%   CATARACT EXTRACTION W/ INTRAOCULAR LENS  IMPLANT, BILATERAL     CHOLECYSTECTOMY N/A 09/29/2012   Procedure:  LAPAROSCOPIC CHOLECYSTECTOMY WITH INTRAOPERATIVE CHOLANGIOGRAM;  Surgeon: Ardeth Sportsman, MD;  Location: [facility] OR;  Service: General;  Laterality: N/A;   COLONOSCOPY  07/2020   CYSTOSCOPY N/A 09/19/2012   Procedure: Derinda Late;  Surgeon: Valetta Fuller, MD;  Location: [facility];  Service: Urology;  Laterality: N/A;   DILATION AND CURETTAGE OF UTERUS     EYE SURGERY Bilateral    cataract removal   INTERCOSTAL NERVE BLOCK  05/30/2021   Procedure: INTERCOSTAL NERVE BLOCK;  Surgeon: Loreli Slot, MD;  Location: [facility] OR;  Service: Thoracic;;   KNEE ARTHROSCOPY W/ MENISCECTOMY  07/27/2011   MEDIAL AND LATERAL   LOBECTOMY  05/30/2021   Procedure: LEFT LOWER LOBECTOMY;  Surgeon: Loreli Slot, MD;  Location: [facility] OR;  Service: Thoracic;;   LYMPH NODE DISSECTION  05/30/2021   Procedure: LYMPH NODE DISSECTION;  Surgeon: Loreli Slot, MD;  Location: [facility] OR;  Service: Thoracic;;   NASAL SEPTUM SURGERY  02/28/1979   PUBOVAGINAL SLING N/A 09/19/2012   Procedure: SUBURETHRAL Elio Forget;  Surgeon: Valetta Fuller, MD;  Location: [facility];  Service: Urology;  Laterality: N/A;   RIGHT URETEROSCOPIC STONE EXTRACTION  08/31/2000   TRANSTHORACIC ECHOCARDIOGRAM  05-27-2011  DR CROITORU   MODERATELY DEPRESSED LVF  DUE TO GLOBAL HYPOKINESIS AND MARKED SYSTOLIC ASYNCHRONY/ EF 16%/ MILD LEFT ATRIAL DILATATION   Patient Active Problem List   Diagnosis Date Noted   Paresthesia 05/28/2022   Chronic low back pain 05/28/2022   Pulmonary embolus, right ([place]) 01/19/2022   Primary adenocarcinoma of lower lobe of left lung ([place]) 07/10/2021   S/P lobectomy of lung 05/30/2021   Multiple pulmonary nodules determined by computed tomography of lung 04/18/2021   GERD (gastroesophageal reflux disease) 01/20/2019   Abnormal chest x-ray 10/20/2018   Cough variant asthma vs UACS/vcd 09/19/2018   DOE (dyspnea on exertion) 09/19/2018   Memory loss 10/14/2015   Mixed  hyperlipidemia 08/23/2014   Major depressive disorder, recurrent episode, severe ([place]) 09/22/2013   Vitamin D Deficiency 07/28/2013   Medication management 07/28/2013   Prediabetes 07/28/2013   Obstructive sleep apnea 05/27/2013   Arrhythmia 04/14/2013   Palpitations 03/12/2013   Delayed gastric emptying 10/18/2012   Steatohepatitis, nonalcoholic 10/96/0454   Female stress incontinence 09/19/2012   Takotsubo syndrome, June 2011.(normal coronaries) 09/12/2012   Cardiomyopathy- EF 45-50% by echo 09/26/12 09/12/2012   Anxiety disorder  09/12/2012   Essential hypertension 09/12/2012   LBBB (left bundle branch block) 09/12/2012   Obesity 09/12/2012   Sleep apnea- non compliant with C-pap 09/12/2012    REFERRING DIAG: K59.00 (ICD-10-CM) - CN (constipation) M62.89 (ICD-10-CM) - PFD (pelvic floor dysfunction)5 N39.3 (ICD-10-CM) - Stress incontinence  THERAPY DIAG:  Abnormal posture  Muscle weakness (generalized)  Other muscle spasm  Unspecified lack of coordination  Rationale for Evaluation and Treatment Rehabilitation  PERTINENT HISTORY: G4P2, Hx lung cancer with lobectomy (05/2021), cardiomyopathy and CHF, pubovaginal sling 2014  PRECAUTIONS: NA  SUBJECTIVE:                                                                                                                                                                                      SUBJECTIVE STATEMENT:  Pt states that she is having better bowel movements over the last week. She was able to try splinting with good success. She is having large improvement in urinary leaking when she goes to stand up. She reports good improvement in low back pain after last session.    PAIN:  Are you having pain? Yes: NPRS scale: 2/10 Pain location: low back Pain description: aching Aggravating factors: constant Relieving factors: sometimes pain medication   05/18/22 SUBJECTIVE STATEMENT: Pt states that she has been sick for 3 years.  She has low back pain and has to have injections; she has Rt knee pain with arthritis. Constipation started as she was going through medical dx process in 2022. She was placed on many different medications for constipation  that did not do anything. She has had problems with Lt thoracic incision ever since. Urinary issues started again since constipation worsened - she did have bad incontinence but it was better after sling.  Fluid intake: Yes: -     PAIN:  Are you having pain? Yes NPRS scale: 2/10, 10/10 at worst Pain location:  low back   Pain type: aching Pain description: constant    Aggravating factors: walking,standing Relieving factors: pain medication, back brace, sleeping inclined, roll on pain medication   PRECAUTIONS: None   WEIGHT BEARING RESTRICTIONS: No   FALLS:  Has patient fallen in last 6 months? No   LIVING ENVIRONMENT: Lives with: lives with their spouse Lives in: House/apartment     OCCUPATION: retired   PLOF: Independent   PATIENT GOALS: decrease pain, get back to enjoying life, get bowel movements under control   PERTINENT HISTORY:  G4P2, Hx lung cancer with lobectomy (05/2021), cardiomyopathy and CHF, pubovaginal sling 2014 Sexual abuse: No   BOWEL MOVEMENT: Pain with bowel movement: No Type of bowel movement:Frequency usually 1-2x/day and Strain Yes - used to go weeks at a time without having one; better with pessary and metamucil  Fully empty rectum: No Leakage: No Pads: No Fiber supplement: Yes: using metamucil *regularly using enemas to empty rectum   URINATION: Pain with urination: No Fully empty bladder: No -will void more if she sits there.  Stream: Strong and sometimes hesitant Urgency: Yes: - Frequency: a whole lot; wakes at night to go to the bathroom 2-3x; 5x/day Leakage: Urge to void, Coughing, Sneezing, Laughing, and Exercise Pads: Yes: -   INTERCOURSE: Pain with intercourse:  not currently sexually active Ability to have  vaginal penetration:  No Climax: - Marinoff Scale: 3/3   PREGNANCY: Vaginal deliveries 2 Tearing Yes: - C-section deliveries 0 Currently pregnant No   PROLAPSE: Rectocele has been fit with a pessary     OBJECTIVE:  07/14/22: Rt antalgic gait pattern Tendency to breath hold and bear down with activity 5x sit<>stand: 28 seconds   06/15/22: PALPATION:   General  scar tissue restriction over Lt rib cage/side; decreased rib cage expansion Lt side                 External Perineal Exam pale plaques consistent with lichens sclerosis                             Internal Pelvic Floor pessary present, low tone   Patient confirms identification and approves PT to assess internal pelvic floor and treatment Yes - future treatment session   PELVIC MMT:   MMT eval  Vaginal  2/5 strength with bil LE/abdominal contraction - poor coordination of pelvic floor contraction, 6 second endurance, 3 repeat contractions  Internal Anal Sphincter    External Anal Sphincter    Puborectalis    Diastasis Recti  7 inch distortion at, above, and below umbilicus  (Blank rows = not tested)         TONE:  low   PROLAPSE: Pessary in place    05/18/22: DIAGNOSTIC FINDINGS:  CT/PET scan with lung cancer diagnosis   COGNITION: Overall cognitive status: Within functional limits for tasks assessed                          SENSATION: Light touch: Appears intact Proprioception: Appears intact   MUSCLE LENGTH:     FUNCTIONAL TESTS: 5x  STS: 26 seconds;    GAIT: Comments: antalgic gait pattern, decreased trunk rotation and bil hip extension   POSTURE: rounded shoulders, forward head, decreased lumbar lordosis, posterior pelvic tilt, and right elevated iliac crest   LUMBARAROM/PROM: flexion 75%, extension 20%, Rt side bend 0% with severe pain, Lt side bend 50% with LOB backwards; difficulty with return to standing after flexion        TODAY'S TREATMENT 07/20/22 Manual: Lumbar paraspinal  soft tissue mobilization in side lying Pt provides verbal consent for internal vaginal/rectal pelvic floor exam. Manual feedback for training in appropriate bearing down/evacuation techniques rectally Neuromuscular re-education: Balloon breathing training Pelvic floor quick flicks with breath coordination   TREATMENT 07/14/22 RE-EVALUATION Manual: Lumbar paraspinal soft tissue mobilization in side lying Therapeutic activities: Review of body mechanics while having bowel movements Review of bowel massage Review of fiber benefit and water intake Urge suppression technique Balloon breathing Splinting during a bowel movement Double-voiding    TREATMENT 07/07/22 Exercises: Seated hip adduction ball squeeze 10x Seated hip abduction green band 10x  Seated march 2 x 10 Seated shoulder horizontal abduction yellow band 10x Seated side bending 5x bil Seated forward flexion 1 x 30 sec    PATIENT EDUCATION:  Education details: see above Person educated: Patient Education method: Programmer, multimedia, Demonstration, Tactile cues, Verbal cues, and Handouts Education comprehension: verbalized understanding   HOME EXERCISE PROGRAM: [id number]   ASSESSMENT:   CLINICAL IMPRESSION: Pt has seen some good progress over the last week with more regular bowel movements, ability to use splinting to help with ease of bowel movements, and decrease in urinary leaking. She believes that low back pain has also been improved after last session. Believe that improvement in low back pain and increase in constipation could demonstrate that manual techniques last session were beneficial for both. The tension/restriction in lumbar paraspinals is likely contributing to constipation and ability to have good bowel movement. Focus of today's treatment session placed on performing more manual techniques to low back to help further release restriction. We did perform balloon breathing and expulsion practice with rectal feedback  in addition to pelvic floor contractions. She will continue to benefit from skilled PT intervention in order to decrease urinary incontinence, improve constipation management, decrease low back pain, and improve QOL.    OBJECTIVE IMPAIRMENTS: decreased activity tolerance, decreased coordination, decreased endurance, decreased mobility, decreased strength, increased fascial restrictions, increased muscle spasms, impaired tone, postural dysfunction, and pain.    ACTIVITY LIMITATIONS: bending, standing, squatting, transfers, and continence   PARTICIPATION LIMITATIONS: community activity and exercise   PERSONAL FACTORS: 3+ comorbidities: G4P2, Hx lung cancer with lobectomy (05/2021), cardiomyopathy and CHF, pubovaginal sling 2014  are also affecting patient's functional outcome.    REHAB POTENTIAL: Fair due to personal factors/medical history   CLINICAL DECISION MAKING: Stable/uncomplicated   EVALUATION COMPLEXITY: Low     GOALS: Goals reviewed with patient? Yes   SHORT TERM GOALS: Target date: 06/15/22 - updated 06/15/22 - updated 07/14/22, new target 09/22/2022    Pt will be independent with HEP.    Baseline: Goal status: MET 07/14/22   2.  Pt will be independent with use of squatty potty, relaxed toileting mechanics, and improved bowel movement techniques in order to increase ease of bowel movements and complete evacuation.    Baseline:  Goal status: MET 07/14/22   3.  Pt will be independent with the knack, urge suppression technique, and double voiding in order to improve bladder habits and decrease urinary incontinence.  Baseline:  Goal status: MET 07/14/22   4.  Pt will be independent with diaphragmatic breathing and down training activities in order to improve pelvic floor relaxation.   Baseline:  Goal status: MET 07/14/22     LONG TERM GOALS: Target date: 07/13/21 - updated 06/15/22 - updated 07/14/22, new target 09/22/2022    Pt will be independent with advanced HEP.     Baseline:  Goal status: IN PROGRESS   2.  Pt will demonstrate normal pelvic floor muscle tone and A/ROM, able to achieve 3/5 strength with contractions and 10 sec endurance, in order to provide appropriate lumbopelvic support in functional activities.    Baseline:  Goal status: IN PROGRESS   3.  Pt will report no leaks with laughing, coughing, sneezing in order to improve comfort with interpersonal relationships and community activities.    Baseline: denies any stress urinary incontinence in the last several weeks Goal status: IN PROGRESS   4.  Pt will have 1 bowel movement a day without straining, use of appropriate body mechanics, and complete emptying without use of enema in order to decrease low back pain, improve urinary incontinence, and improve bowel health. Baseline: still having to use an enema occasionally with only several bowel movements a week Goal status: IN PROGRESS   5.  Pt will report decrease in highest low back pain to no greater than 6/10 and increase all lumbar A/ROM by 25% in order to improve functional ability.  Baseline: pain got up to an 8/10 over the last week  Goal status: IN PROGRESS   6.  Pt decrease 5x STS test time by 5 seconds in order to decrease risk for falls and demonstrate improved functional ability. Baseline: 28 second 07/14/22 Goal status: IN PROGRESS   PLAN:   PT FREQUENCY: 1x/week   PT DURATION: 8 weeks   PLANNED INTERVENTIONS: Therapeutic exercises, Therapeutic activity, Neuromuscular re-education, Balance training, Gait training, Patient/Family education, Self Care, Joint mobilization, Dry Needling, Biofeedback, and Manual therapy   PLAN FOR NEXT SESSION: Continue to progress gentle core/hip strengthening to tolerance. Manual techniques to low back to help improve constipation.    Julio Alm, PT, DPT01/23/241:30 PM

## 2022-07-22 ENCOUNTER — Other Ambulatory Visit: Payer: Self-pay

## 2022-07-22 ENCOUNTER — Ambulatory Visit (HOSPITAL_COMMUNITY)
Admission: RE | Admit: 2022-07-22 | Discharge: 2022-07-22 | Disposition: A | Discharge status: Home / Self Care | Payer: Medicare Other | Source: Ambulatory Visit | Attending: Internal Medicine | Admitting: Internal Medicine

## 2022-07-22 ENCOUNTER — Inpatient Hospital Stay (HOSPITAL_BASED_OUTPATIENT_CLINIC_OR_DEPARTMENT_OTHER): Payer: Medicare Other | Admitting: Internal Medicine

## 2022-07-22 ENCOUNTER — Inpatient Hospital Stay: Payer: Medicare Other | Attending: Internal Medicine

## 2022-07-22 VITALS — BP 120/75 | HR 81 | Temp 98.2°F | Resp 17 | Wt 158.1 lb

## 2022-07-22 DIAGNOSIS — Z90710 Acquired absence of both cervix and uterus: Secondary | ICD-10-CM | POA: Diagnosis not present

## 2022-07-22 DIAGNOSIS — Z86711 Personal history of pulmonary embolism: Secondary | ICD-10-CM | POA: Diagnosis not present

## 2022-07-22 DIAGNOSIS — I110 Hypertensive heart disease with heart failure: Secondary | ICD-10-CM | POA: Insufficient documentation

## 2022-07-22 DIAGNOSIS — C3490 Malignant neoplasm of unspecified part of unspecified bronchus or lung: Secondary | ICD-10-CM | POA: Diagnosis not present

## 2022-07-22 DIAGNOSIS — C3432 Malignant neoplasm of lower lobe, left bronchus or lung: Secondary | ICD-10-CM | POA: Diagnosis not present

## 2022-07-22 DIAGNOSIS — J439 Emphysema, unspecified: Secondary | ICD-10-CM | POA: Diagnosis not present

## 2022-07-22 DIAGNOSIS — Z7901 Long term (current) use of anticoagulants: Secondary | ICD-10-CM | POA: Insufficient documentation

## 2022-07-22 DIAGNOSIS — Z902 Acquired absence of lung [part of]: Secondary | ICD-10-CM | POA: Diagnosis not present

## 2022-07-22 DIAGNOSIS — I5042 Chronic combined systolic (congestive) and diastolic (congestive) heart failure: Secondary | ICD-10-CM | POA: Diagnosis not present

## 2022-07-22 LAB — CBC WITH DIFFERENTIAL (CANCER CENTER ONLY)
Abs Immature Granulocytes: 0.04 10*3/uL (ref 0.00–0.07)
Basophils Absolute: 0.1 10*3/uL (ref 0.0–0.1)
Basophils Relative: 1 %
Eosinophils Absolute: 0.1 10*3/uL (ref 0.0–0.5)
Eosinophils Relative: 1 %
HCT: 38.4 % (ref 36.0–46.0)
Hemoglobin: 12.6 g/dL (ref 12.0–15.0)
Immature Granulocytes: 0 %
Lymphocytes Relative: 17 %
Lymphs Abs: 1.8 10*3/uL (ref 0.7–4.0)
MCH: 26.9 pg (ref 26.0–34.0)
MCHC: 32.8 g/dL (ref 30.0–36.0)
MCV: 82.1 fL (ref 80.0–100.0)
Monocytes Absolute: 0.8 10*3/uL (ref 0.1–1.0)
Monocytes Relative: 7 %
Neutro Abs: 7.6 10*3/uL (ref 1.7–7.7)
Neutrophils Relative %: 74 %
Platelet Count: 275 10*3/uL (ref 150–400)
RBC: 4.68 MIL/uL (ref 3.87–5.11)
RDW: 15.6 % — ABNORMAL HIGH (ref 11.5–15.5)
WBC Count: 10.5 10*3/uL (ref 4.0–10.5)
nRBC: 0 % (ref 0.0–0.2)

## 2022-07-22 LAB — CMP (CANCER CENTER ONLY)
ALT: 14 U/L (ref 0–44)
AST: 15 U/L (ref 15–41)
Albumin: 3.8 g/dL (ref 3.5–5.0)
Alkaline Phosphatase: 64 U/L (ref 38–126)
Anion gap: 4 — ABNORMAL LOW (ref 5–15)
BUN: 18 mg/dL (ref 8–23)
CO2: 30 mmol/L (ref 22–32)
Calcium: 9.4 mg/dL (ref 8.9–10.3)
Chloride: 105 mmol/L (ref 98–111)
Creatinine: 0.88 mg/dL (ref 0.44–1.00)
GFR, Estimated: >60 mL/min (ref >60)
Glucose, Bld: 118 mg/dL — ABNORMAL HIGH (ref 70–99)
Potassium: 4.6 mmol/L (ref 3.5–5.1)
Sodium: 139 mmol/L (ref 135–145)
Total Bilirubin: 0.6 mg/dL (ref 0.3–1.2)
Total Protein: 6.9 g/dL (ref 6.5–8.1)

## 2022-07-22 MED ORDER — IOHEXOL 300 MG/ML  SOLN
75.0000 mL | Freq: Once | INTRAMUSCULAR | Status: AC | PRN
  Administered 2022-07-22: 75 mL via INTRAVENOUS

## 2022-07-22 MED ORDER — SODIUM CHLORIDE (PF) 0.9 % IJ SOLN
INTRAMUSCULAR | Status: AC
  Filled 2022-07-22: qty 50

## 2022-07-22 NOTE — Progress Notes (Signed)
[place] [phone]   [phone]  OFFICE PROGRESS NOTE  Shirline Frees, MD [place] [id number]  DIAGNOSIS:  1) Stage IA (T1c, N0, M0) non-small cell lung cancer, adenocarcinoma diagnosed in December 2022 2) incidental finding of pulmonary embolus within the right middle lobar pulmonary artery on CT scan of 01/13/2022  PRIOR THERAPY:   1) Status post left lower lobectomy with lymph node dissection under the care of Dr. Roxan Hockey on May 30, 2021. 2) Eliquis 5 mg p.o. twice daily started January 13, 2022.  She will complete 6 months of her treatment this month.  CURRENT THERAPY: Observation  INTERVAL HISTORY: Laura [name] 77 y.o. female returns to the clinic today for follow-up visit accompanied by her husband.  The patient is feeling fine today with no concerning complaints.  She denied having any current chest pain, shortness of breath, cough or hemoptysis.  She has no nausea, vomiting, diarrhea or constipation.  She has no headache or visual changes.  She denied having any significant weight loss or night sweats.  She is here today for evaluation with repeat CT scan of the chest for restaging of her disease.  She has been on treatment with Eliquis for pulmonary embolism that was incidentally diagnosed in July 2023.  MEDICAL HISTORY: Past Medical History:  Diagnosis Date   Anemia    Anxiety    Asthma    related to sesonal allergies   Cancer ([place])    Chronic combined systolic and diastolic CHF, NYHA class 2 ([place]) CARDIOLOGIST-  DR [id number]   Coronary artery disease    Depression    History of kidney stones    History of non-ST elevation myocardial infarction (NSTEMI) 11/27/2009   SECONDARY TO TAKOTSUDO SYNDROME (CARDIAC CATH NORMAL)   Hyperlipemia    Hypertension    Hypoglycemia    Hypothyroidism    LBBB (left bundle branch block)    Left ventricular ejection fraction less than 40%    38% PER  CARDIOLOGIST NOTE (DR CROITORU)   Memory loss    Mood swings    Myocardial infarction ([place]) 2011   Nonischemic dilated cardiomyopathy ([place])    MODERATELY DEPRESSED LVF;EF 35-45% by Echo 05/27/11   OSA (obstructive sleep apnea) MODERATE PER STUDY 2005   CPAP NONCOMPLIANT   Pre-diabetes    Seasonal allergies    SUI (stress urinary incontinence, female)     ALLERGIES:  is allergic to codeine, ciprofloxacin, ace inhibitors, citalopram, fetzima [levomilnacipran], lasix [furosemide], nsaids, sulfamethoxazole-trimethoprim, and xanax xr [alprazolam er].  MEDICATIONS:  Current Outpatient Medications  Medication Sig Dispense Refill   apixaban (ELIQUIS) 5 MG TABS tablet Take 1 tablet (5 mg total) by mouth 2 (two) times daily. 60 tablet 3   atorvastatin (LIPITOR) 80 MG tablet TAKE 1/2 TABLET BY MOUTH DAILY FOR CHOLESTEROL (Patient taking differently: Take 40 mg by mouth daily. TAKE 1/2 TABLET BY MOUTH DAILY FOR CHOLESTEROL) 45 tablet 3   diazepam (VALIUM) 5 MG tablet Take 1 tablet (5 mg total) by mouth every 12 (twelve) hours as needed for muscle spasms. (Patient taking differently: Take 10 mg by mouth every 12 (twelve) hours as needed for muscle spasms. Take 10 mg by mouth) 10 tablet 0   DULoxetine (CYMBALTA) 30 MG capsule Take 1 capsule (30 mg total) by mouth daily. 30 capsule 0   DULoxetine (CYMBALTA) 60 MG capsule Take 1 capsule (60 mg total) by mouth daily. 30 capsule 11  hydrALAZINE (APRESOLINE) 50 MG tablet Take 1 tablet (50 mg total) by mouth 2 (two) times daily. 180 tablet 3   irbesartan (AVAPRO) 300 MG tablet Take 300 mg by mouth daily.     levothyroxine (SYNTHROID, LEVOTHROID) 88 MCG tablet Take 88 mcg by mouth daily before breakfast.     Melatonin 10 MG TABS Take 5 mg by mouth at bedtime as needed.     metoprolol succinate (TOPROL-XL) 50 MG 24 hr tablet TAKE ONE AND ONE-HALF TABLETS BY MOUTH DAILY 90 tablet 3   NUVIGIL 250 MG tablet Take 125 mg by mouth daily as needed (energy boost).  3    pregabalin (LYRICA) 75 MG capsule Take 1 capsule (75 mg total) by mouth 3 (three) times daily. 90 capsule 5   No current facility-administered medications for this visit.   Facility-Administered Medications Ordered in Other Visits  Medication Dose Route Frequency Provider Last Rate Last Admin   sodium chloride (PF) 0.9 % injection             SURGICAL HISTORY:  Past Surgical History:  Procedure Laterality Date   ABDOMINAL HYSTERECTOMY  06/30/1983   partial   CARDIAC CATHETERIZATION  09-04-1999;  08/25/2004;   12/09/2009  DR CROITORU   NORMAL CORONARIES/  APICAL BALLOONING OF LV CONSISTENT WITH TAKOTSUBO SYMPTOMS/ EF 30-35%   CATARACT EXTRACTION W/ INTRAOCULAR LENS  IMPLANT, BILATERAL     CHOLECYSTECTOMY N/A 09/29/2012   Procedure: LAPAROSCOPIC CHOLECYSTECTOMY WITH INTRAOPERATIVE CHOLANGIOGRAM;  Surgeon: Adin Hector, MD;  Location: [facility];  Service: General;  Laterality: N/A;   COLONOSCOPY  07/2020   CYSTOSCOPY N/A 09/19/2012   Procedure: Erlene Quan;  Surgeon: Bernestine Amass, MD;  Location: [facility];  Service: Urology;  Laterality: N/A;   DILATION AND CURETTAGE OF UTERUS     EYE SURGERY Bilateral    cataract removal   INTERCOSTAL NERVE BLOCK  05/30/2021   Procedure: INTERCOSTAL NERVE BLOCK;  Surgeon: Melrose Nakayama, MD;  Location: [facility];  Service: Thoracic;;   KNEE ARTHROSCOPY W/ MENISCECTOMY  07/27/2011   MEDIAL AND LATERAL   LOBECTOMY  05/30/2021   Procedure: LEFT LOWER LOBECTOMY;  Surgeon: Melrose Nakayama, MD;  Location: [facility];  Service: Thoracic;;   LYMPH NODE DISSECTION  05/30/2021   Procedure: LYMPH NODE DISSECTION;  Surgeon: Melrose Nakayama, MD;  Location: [place];  Service: Thoracic;;   NASAL SEPTUM SURGERY  02/28/1979   PUBOVAGINAL SLING N/A 09/19/2012   Procedure: SUBURETHRAL Janyth Pupa;  Surgeon: Bernestine Amass, MD;  [facility];  Service: Urology;  Laterality: N/A;   RIGHT URETEROSCOPIC STONE  EXTRACTION  08/31/2000   TRANSTHORACIC ECHOCARDIOGRAM  05-27-2011  DR CROITORU   MODERATELY DEPRESSED LVF DUE TO GLOBAL HYPOKINESIS AND MARKED SYSTOLIC ASYNCHRONY/ EF 27%/ MILD LEFT ATRIAL DILATATION    REVIEW OF SYSTEMS:  A comprehensive review of systems was negative.   PHYSICAL EXAMINATION: General appearance: alert, cooperative, and no distress Head: Normocephalic, without obvious abnormality, atraumatic Neck: no adenopathy, no JVD, supple, symmetrical, trachea midline, and thyroid not enlarged, symmetric, no tenderness/mass/nodules Lymph nodes: Cervical, supraclavicular, and axillary nodes normal. Resp: clear to auscultation bilaterally Back: symmetric, no curvature. ROM normal. No CVA tenderness. Cardio: regular rate and rhythm, S1, S2 normal, no murmur, click, rub or gallop GI: soft, non-tender; bowel sounds normal; no masses,  no organomegaly Extremities: extremities normal, atraumatic, no cyanosis or edema  ECOG PERFORMANCE STATUS: 1 - Symptomatic but completely ambulatory  Blood pressure 120/75, pulse  81, temperature 98.2 F (36.8 C), temperature source Oral, resp. rate 17, weight 158 lb 1 oz (71.7 kg), SpO2 99 %.  LABORATORY DATA: Lab Results  Component Value Date   WBC 10.5 07/22/2022   HGB 12.6 07/22/2022   HCT 38.4 07/22/2022   MCV 82.1 07/22/2022   PLT 275 07/22/2022      Chemistry      Component Value Date/Time   NA 139 07/22/2022 0910   NA 144 08/30/2020 1550   K 4.6 07/22/2022 0910   CL 105 07/22/2022 0910   CO2 30 07/22/2022 0910   BUN 18 07/22/2022 0910   BUN 10 08/30/2020 1550   CREATININE 0.88 07/22/2022 0910   CREATININE 0.75 05/15/2015 1306      Component Value Date/Time   CALCIUM 9.4 07/22/2022 0910   ALKPHOS 64 07/22/2022 0910   AST 15 07/22/2022 0910   ALT 14 07/22/2022 0910   BILITOT 0.6 07/22/2022 0910       RADIOGRAPHIC STUDIES: CT Chest W Contrast  Result Date: 07/22/2022 CLINICAL DATA:  History of non-small cell lung cancer  post left lower lobe resection. Follow up. * Tracking Code: BO * EXAM: CT CHEST WITH CONTRAST TECHNIQUE: Multidetector CT imaging of the chest was performed during intravenous contrast administration. RADIATION DOSE REDUCTION: This exam was performed according to the departmental dose-optimization program which includes automated exposure control, adjustment of the mA and/or kV according to patient size and/or use of iterative reconstruction technique. CONTRAST:  24mL OMNIPAQUE IOHEXOL 300 MG/ML  SOLN COMPARISON:  Chest CT 01/13/2022 and 05/12/2020. FINDINGS: Cardiovascular: No acute vascular findings. There is suboptimal opacification of the pulmonary arteries, but no evidence of residual/recurrent pulmonary embolism. Diffuse aortic and branch vessel atherosclerosis. The heart size is normal. There is no pericardial effusion. Mediastinum/Nodes: There are no enlarged mediastinal, hilar or axillary lymph nodes. The thyroid gland, trachea and esophagus demonstrate no significant findings. Lungs/Pleura: No pleural effusion or pneumothorax. Stable postsurgical changes related to previous left lower lobectomy. Stable mild emphysematous changes with scattered subpleural reticulation and scarring. Stable calcified right lower lobe granuloma. No suspicious pulmonary nodules. Upper abdomen: The visualized upper abdomen appears stable, without significant findings. Musculoskeletal/Chest wall: There is no chest wall mass or suspicious osseous finding. Multilevel thoracic spondylosis and ankylosis as before. IMPRESSION: 1. Stable chest CT status post left lower lobectomy. No evidence of local recurrence or metastatic disease. 2.  Aortic Atherosclerosis (ICD10-I70.0). Electronically Signed   By: Richardean Sale M.D.   On: 07/22/2022 08:40     ASSESSMENT AND PLAN: This is a very pleasant 77 years old white female diagnosed with: 1) Stage IA (T1c, N0, M0) non-small cell lung cancer, adenocarcinoma diagnosed in December 2022  Status post left lower lobectomy with lymph node dissection under the care of Dr. Roxan Hockey on May 30, 2021. She is currently on observation and she is feeling fine with no concerning complaints. She had repeat CT scan of the chest performed earlier today.  I personally and independently reviewed the scan and discussed the result with the patient today. Her scan showed no concerning for disease recurrence or metastasis. 2) incidental finding of pulmonary embolus within the right middle lobar pulmonary artery on CT scan of 01/13/2022.  I started the patient on Eliquis 5 mg p.o. twice daily.  She has been tolerating it fairly well with no significant bleeding issues. The scan performed earlier today showed no evidence for residual or recurrent pulmonary embolism. I recommended for the patient to discontinue her treatment with  Eliquis this months. I will see her back for follow-up visit in 6 months for evaluation with repeat CT scan of the chest for restaging of her disease. She was advised to call immediately if she has any other concerning symptoms in the interval. The patient voices understanding of current disease status and treatment options and is in agreement with the current care plan.  All questions were answered. The patient knows to call the clinic with any problems, questions or concerns. We can certainly see the patient much sooner if necessary.  The total time spent in the appointment was 20 minutes.  Disclaimer: This note was dictated with voice recognition software. Similar sounding words can inadvertently be transcribed and may not be corrected upon review.

## 2022-07-28 ENCOUNTER — Ambulatory Visit: Payer: Medicare Other

## 2022-07-28 DIAGNOSIS — R279 Unspecified lack of coordination: Secondary | ICD-10-CM | POA: Diagnosis not present

## 2022-07-28 DIAGNOSIS — M62838 Other muscle spasm: Secondary | ICD-10-CM

## 2022-07-28 DIAGNOSIS — R293 Abnormal posture: Secondary | ICD-10-CM

## 2022-07-28 DIAGNOSIS — M6281 Muscle weakness (generalized): Secondary | ICD-10-CM

## 2022-07-28 NOTE — Therapy (Signed)
OUTPATIENT PHYSICAL THERAPY TREATMENT NOTE   Patient Name: Laura [name] MRN: [id number] DOB:1945-09-05, 77 y.o., female Today's Date: 07/28/2022  PCP: Shirline Frees, MD REFERRING PROVIDER: Tomasa Blase, NP  END OF SESSION:   PT End of Session - 07/28/22 1231     Visit Number 8    Date for PT Re-Evaluation 09/22/22    Authorization Type Medicare    PT Start Time 52    PT Stop Time 1310    PT Time Calculation (min) 40 min    Activity Tolerance Patient tolerated treatment well    Behavior During Therapy WFL for tasks assessed/performed                   Past Medical History:  Diagnosis Date   Anemia    Anxiety    Asthma    related to sesonal allergies   Cancer ([place])    Chronic combined systolic and diastolic CHF, NYHA class 2 ([place]) CARDIOLOGIST-  DR [id number]   Coronary artery disease    Depression    History of kidney stones    History of non-ST elevation myocardial infarction (NSTEMI) 11/27/2009   SECONDARY TO TAKOTSUDO SYNDROME (CARDIAC CATH NORMAL)   Hyperlipemia    Hypertension    Hypoglycemia    Hypothyroidism    LBBB (left bundle branch block)    Left ventricular ejection fraction less than 40%    38% PER CARDIOLOGIST NOTE (DR CROITORU)   Memory loss    Mood swings    Myocardial infarction ([place]) 2011   Nonischemic dilated cardiomyopathy ([place])    MODERATELY DEPRESSED LVF;EF 35-45% by Echo 05/27/11   OSA (obstructive sleep apnea) MODERATE PER STUDY 2005   CPAP NONCOMPLIANT   Pre-diabetes    Seasonal allergies    SUI (stress urinary incontinence, female)    Past Surgical History:  Procedure Laterality Date   ABDOMINAL HYSTERECTOMY  06/30/1983   partial   CARDIAC CATHETERIZATION  09-04-1999;  08/25/2004;   12/09/2009  DR CROITORU   NORMAL CORONARIES/  APICAL BALLOONING OF LV CONSISTENT WITH TAKOTSUBO SYMPTOMS/ EF 30-35%   CATARACT EXTRACTION W/ INTRAOCULAR LENS  IMPLANT, BILATERAL     CHOLECYSTECTOMY N/A 09/29/2012   Procedure:  LAPAROSCOPIC CHOLECYSTECTOMY WITH INTRAOPERATIVE CHOLANGIOGRAM;  Surgeon: Adin Hector, MD;  Location: [place];  Service: General;  Laterality: N/A;   COLONOSCOPY  07/2020   CYSTOSCOPY N/A 09/19/2012   Procedure: Erlene Quan;  Surgeon: Bernestine Amass, MD;  Location[facility];  Service: Urology;  Laterality: N/A;   DILATION AND CURETTAGE OF UTERUS     EYE SURGERY Bilateral    cataract removal   INTERCOSTAL NERVE BLOCK  05/30/2021   Procedure: INTERCOSTAL NERVE BLOCK;  Surgeon: Melrose Nakayama, MD;  Location: [place];  Service: Thoracic;;   KNEE ARTHROSCOPY W/ MENISCECTOMY  07/27/2011   MEDIAL AND LATERAL   LOBECTOMY  05/30/2021   Procedure: LEFT LOWER LOBECTOMY;  Surgeon: Melrose Nakayama, MD;  Location: [place];  Service: Thoracic;;   LYMPH NODE DISSECTION  05/30/2021   Procedure: LYMPH NODE DISSECTION;  Surgeon: Melrose Nakayama, MD;  Location: [place];  Service: Thoracic;;   NASAL SEPTUM SURGERY  02/28/1979   PUBOVAGINAL SLING N/A 09/19/2012   Procedure: SUBURETHRAL Janyth Pupa;  Surgeon: Bernestine Amass, MD;  Location: [facility];  Service: Urology;  Laterality: N/A;   RIGHT URETEROSCOPIC STONE EXTRACTION  08/31/2000   TRANSTHORACIC ECHOCARDIOGRAM  05-27-2011  DR CROITORU   MODERATELY DEPRESSED  LVF DUE TO GLOBAL HYPOKINESIS AND MARKED SYSTOLIC ASYNCHRONY/ EF 10%/ MILD LEFT ATRIAL DILATATION   Patient Active Problem List   Diagnosis Date Noted   Paresthesia 05/28/2022   Chronic low back pain 05/28/2022   Pulmonary embolus, right ([place]) 01/19/2022   Primary adenocarcinoma of lower lobe of left lung ([place]) 07/10/2021   S/P lobectomy of lung 05/30/2021   Multiple pulmonary nodules determined by computed tomography of lung 04/18/2021   GERD (gastroesophageal reflux disease) 01/20/2019   Abnormal chest x-ray 10/20/2018   Cough variant asthma vs UACS/vcd 09/19/2018   DOE (dyspnea on exertion) 09/19/2018   Memory loss 10/14/2015   Mixed  hyperlipidemia 08/23/2014   Major depressive disorder, recurrent episode, severe ([place]) 09/22/2013   Vitamin D Deficiency 07/28/2013   Medication management 07/28/2013   Prediabetes 07/28/2013   Obstructive sleep apnea 05/27/2013   Arrhythmia 04/14/2013   Palpitations 03/12/2013   Delayed gastric emptying 10/18/2012   Steatohepatitis, nonalcoholic 27/25/3664   Female stress incontinence 09/19/2012   Takotsubo syndrome, June 2011.(normal coronaries) 09/12/2012   Cardiomyopathy- EF 45-50% by echo 09/26/12 09/12/2012   Anxiety disorder  09/12/2012   Essential hypertension 09/12/2012   LBBB (left bundle branch block) 09/12/2012   Obesity 09/12/2012   Sleep apnea- non compliant with C-pap 09/12/2012    REFERRING DIAG: K59.00 (ICD-10-CM) - CN (constipation) M62.89 (ICD-10-CM) - PFD (pelvic floor dysfunction)5 N39.3 (ICD-10-CM) - Stress incontinence  THERAPY DIAG:  Abnormal posture  Muscle weakness (generalized)  Other muscle spasm  Unspecified lack of coordination  Rationale for Evaluation and Treatment Rehabilitation  PERTINENT HISTORY: G4P2, Hx lung cancer with lobectomy (05/2021), cardiomyopathy and CHF, pubovaginal sling 2014  PRECAUTIONS: NA  SUBJECTIVE:                                                                                                                                                                                      SUBJECTIVE STATEMENT: Pt states that she has been to see MD and has been cleared of cancer and blood clot. Pt has been doing exercises. She states that her low back is a little sore. She states that she is now having low back pain every morning. She has been doing the splinting with good success.   PAIN:  Are you having pain? Yes: NPRS scale: 2/10 Pain location: low back Pain description: aching Aggravating factors: constant Relieving factors: sometimes pain medication   05/18/22 SUBJECTIVE STATEMENT: Pt states that she has been sick for 3  years. She has low back pain and has to have injections; she has Rt knee pain with arthritis. Constipation started as she was going through medical dx process in 2022. She was placed  on many different medications for constipation that did not do anything. She has had problems with Lt thoracic incision ever since. Urinary issues started again since constipation worsened - she did have bad incontinence but it was better after sling.  Fluid intake: Yes: -     PAIN:  Are you having pain? Yes NPRS scale: 2/10, 10/10 at worst Pain location:  low back   Pain type: aching Pain description: constant    Aggravating factors: walking,standing Relieving factors: pain medication, back brace, sleeping inclined, roll on pain medication   PRECAUTIONS: None   WEIGHT BEARING RESTRICTIONS: No   FALLS:  Has patient fallen in last 6 months? No   LIVING ENVIRONMENT: Lives with: lives with their spouse Lives in: House/apartment     OCCUPATION: retired   PLOF: Independent   PATIENT GOALS: decrease pain, get back to enjoying life, get bowel movements under control   PERTINENT HISTORY:  G4P2, Hx lung cancer with lobectomy (05/2021), cardiomyopathy and CHF, pubovaginal sling 2014 Sexual abuse: No   BOWEL MOVEMENT: Pain with bowel movement: No Type of bowel movement:Frequency usually 1-2x/day and Strain Yes - used to go weeks at a time without having one; better with pessary and metamucil  Fully empty rectum: No Leakage: No Pads: No Fiber supplement: Yes: using metamucil *regularly using enemas to empty rectum   URINATION: Pain with urination: No Fully empty bladder: No -will void more if she sits there.  Stream: Strong and sometimes hesitant Urgency: Yes: - Frequency: a whole lot; wakes at night to go to the bathroom 2-3x; 5x/day Leakage: Urge to void, Coughing, Sneezing, Laughing, and Exercise Pads: Yes: -   INTERCOURSE: Pain with intercourse:  not currently sexually active Ability to  have vaginal penetration:  No Climax: - Marinoff Scale: 3/3   PREGNANCY: Vaginal deliveries 2 Tearing Yes: - C-section deliveries 0 Currently pregnant No   PROLAPSE: Rectocele has been fit with a pessary     OBJECTIVE:  07/14/22: Rt antalgic gait pattern Tendency to breath hold and bear down with activity 5x sit<>stand: 28 seconds   06/15/22: PALPATION:   General  scar tissue restriction over Lt rib cage/side; decreased rib cage expansion Lt side                 External Perineal Exam pale plaques consistent with lichens sclerosis                             Internal Pelvic Floor pessary present, low tone   Patient confirms identification and approves PT to assess internal pelvic floor and treatment Yes - future treatment session   PELVIC MMT:   MMT eval  Vaginal  2/5 strength with bil LE/abdominal contraction - poor coordination of pelvic floor contraction, 6 second endurance, 3 repeat contractions  Internal Anal Sphincter    External Anal Sphincter    Puborectalis    Diastasis Recti  7 inch distortion at, above, and below umbilicus  (Blank rows = not tested)         TONE:  low   PROLAPSE: Pessary in place    05/18/22: DIAGNOSTIC FINDINGS:  CT/PET scan with lung cancer diagnosis   COGNITION: Overall cognitive status: Within functional limits for tasks assessed                          SENSATION: Light touch: Appears intact Proprioception: Appears intact   MUSCLE LENGTH:  FUNCTIONAL TESTS: 5x STS: 26 seconds;    GAIT: Comments: antalgic gait pattern, decreased trunk rotation and bil hip extension   POSTURE: rounded shoulders, forward head, decreased lumbar lordosis, posterior pelvic tilt, and right elevated iliac crest   LUMBARAROM/PROM: flexion 75%, extension 20%, Rt side bend 0% with severe pain, Lt side bend 50% with LOB backwards; difficulty with return to standing after flexion        TODAY'S TREATMENT 07/28/22 Manual: Lumbar  paraspinal soft tissue mobilization in side lying Neuromuscular re-education: Transversus abdominus training with multimodal cues for improved motor control and breath coordination Therapeutic activities: Bladder training/voiding schedule of every 3 hours   TREATMENT 07/20/22 Manual: Lumbar paraspinal soft tissue mobilization in side lying Pt provides verbal consent for internal vaginal/rectal pelvic floor exam. Manual feedback for training in appropriate bearing down/evacuation techniques rectally Neuromuscular re-education: Balloon breathing training Pelvic floor quick flicks with breath coordination   TREATMENT 07/14/22 RE-EVALUATION Manual: Lumbar paraspinal soft tissue mobilization in side lying Therapeutic activities: Review of body mechanics while having bowel movements Review of bowel massage Review of fiber benefit and water intake Urge suppression technique Balloon breathing Splinting during a bowel movement Double-voiding     PATIENT EDUCATION:  Education details: see above Person educated: Patient Education method: Consulting civil engineer, Demonstration, Tactile cues, Verbal cues, and Handouts Education comprehension: verbalized understanding   HOME EXERCISE PROGRAM: [id number]   ASSESSMENT:   CLINICAL IMPRESSION: Pt is making great progress with bowel movements and is now having daily bowel movement without straining. She is having more of an issue with urinary incontinence. We talked about going to the bathroom slightly more often in order to give bladder opportunity to empty to decrease leaking without sensation. Overall good progress. She will continue to benefit from skilled PT intervention in order to decrease urinary incontinence, improve constipation management, decrease low back pain, and improve QOL.    OBJECTIVE IMPAIRMENTS: decreased activity tolerance, decreased coordination, decreased endurance, decreased mobility, decreased strength, increased fascial  restrictions, increased muscle spasms, impaired tone, postural dysfunction, and pain.    ACTIVITY LIMITATIONS: bending, standing, squatting, transfers, and continence   PARTICIPATION LIMITATIONS: community activity and exercise   PERSONAL FACTORS: 3+ comorbidities: G4P2, Hx lung cancer with lobectomy (05/2021), cardiomyopathy and CHF, pubovaginal sling 2014  are also affecting patient's functional outcome.    REHAB POTENTIAL: Fair due to personal factors/medical history   CLINICAL DECISION MAKING: Stable/uncomplicated   EVALUATION COMPLEXITY: Low     GOALS: Goals reviewed with patient? Yes   SHORT TERM GOALS: Target date: 06/15/22 - updated 06/15/22 - updated 07/14/22, new target 09/22/2022    Pt will be independent with HEP.    Baseline: Goal status: MET 07/14/22   2.  Pt will be independent with use of squatty potty, relaxed toileting mechanics, and improved bowel movement techniques in order to increase ease of bowel movements and complete evacuation.    Baseline:  Goal status: MET 07/14/22   3.  Pt will be independent with the knack, urge suppression technique, and double voiding in order to improve bladder habits and decrease urinary incontinence.    Baseline:  Goal status: MET 07/14/22   4.  Pt will be independent with diaphragmatic breathing and down training activities in order to improve pelvic floor relaxation.   Baseline:  Goal status: MET 07/14/22     LONG TERM GOALS: Target date: 07/13/21 - updated 06/15/22 - updated 07/14/22, new target 09/22/2022    Pt will be independent with advanced HEP.  Baseline:  Goal status: IN PROGRESS   2.  Pt will demonstrate normal pelvic floor muscle tone and A/ROM, able to achieve 3/5 strength with contractions and 10 sec endurance, in order to provide appropriate lumbopelvic support in functional activities.    Baseline:  Goal status: IN PROGRESS   3.  Pt will report no leaks with laughing, coughing, sneezing in order to  improve comfort with interpersonal relationships and community activities.    Baseline: denies any stress urinary incontinence in the last several weeks Goal status: IN PROGRESS   4.  Pt will have 1 bowel movement a day without straining, use of appropriate body mechanics, and complete emptying without use of enema in order to decrease low back pain, improve urinary incontinence, and improve bowel health. Baseline: still having to use an enema occasionally with only several bowel movements a week Goal status: IN PROGRESS   5.  Pt will report decrease in highest low back pain to no greater than 6/10 and increase all lumbar A/ROM by 25% in order to improve functional ability.  Baseline: pain got up to an 8/10 over the last week  Goal status: IN PROGRESS   6.  Pt decrease 5x STS test time by 5 seconds in order to decrease risk for falls and demonstrate improved functional ability. Baseline: 28 second 07/14/22 Goal status: IN PROGRESS   PLAN:   PT FREQUENCY: 1x/week   PT DURATION: 8 weeks   PLANNED INTERVENTIONS: Therapeutic exercises, Therapeutic activity, Neuromuscular re-education, Balance training, Gait training, Patient/Family education, Self Care, Joint mobilization, Dry Needling, Biofeedback, and Manual therapy   PLAN FOR NEXT SESSION: Possible D/C.    Heather Roberts, PT, DPT01/30/241:32 PM

## 2022-07-29 DIAGNOSIS — G629 Polyneuropathy, unspecified: Secondary | ICD-10-CM | POA: Diagnosis not present

## 2022-07-29 DIAGNOSIS — M5440 Lumbago with sciatica, unspecified side: Secondary | ICD-10-CM | POA: Diagnosis not present

## 2022-07-30 IMAGING — MG MM DIGITAL SCREENING BILAT W/ TOMO AND CAD
6 of 10 series · 6 of 30 positions shown · non-contrast
Comparison: Previous exam(s).

CLINICAL DATA: Screening.

EXAM:
DIGITAL SCREENING BILATERAL MAMMOGRAM WITH TOMOSYNTHESIS AND CAD
TECHNIQUE: Bilateral screening digital craniocaudal and mediolateral oblique
mammograms were obtained. Bilateral screening digital breast
tomosynthesis was performed. The images were evaluated with
computer-aided detection.

[R MLO synth-2D]
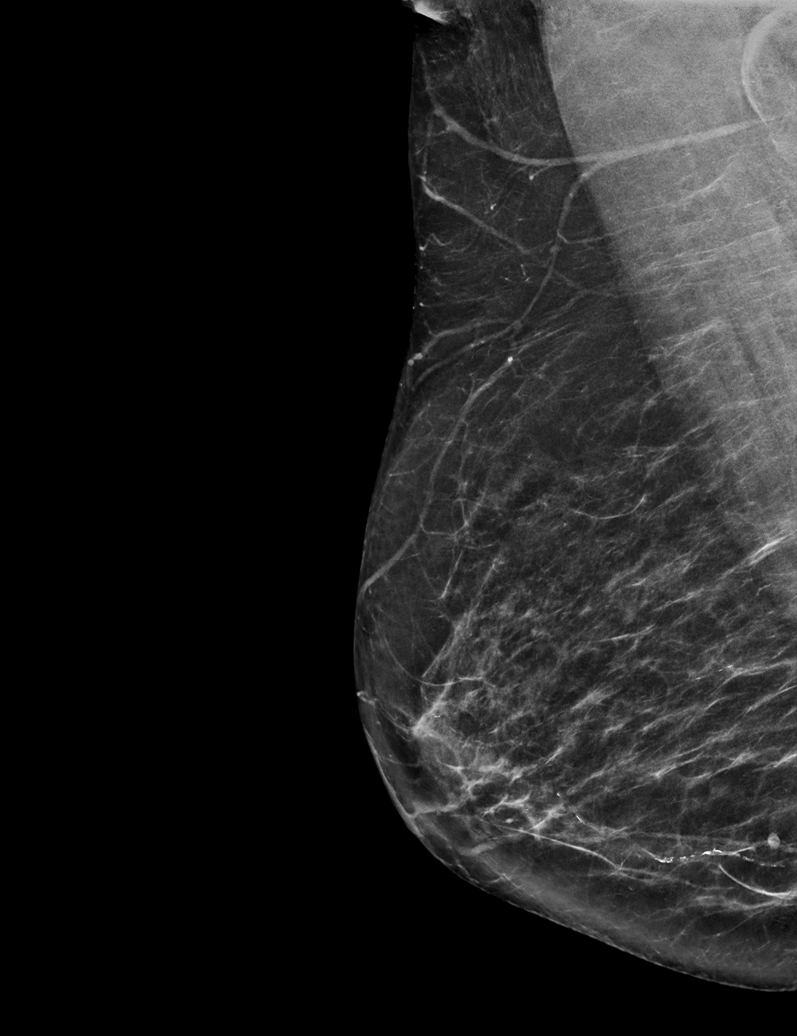

[L MLO synth-2D (1 of 2)]
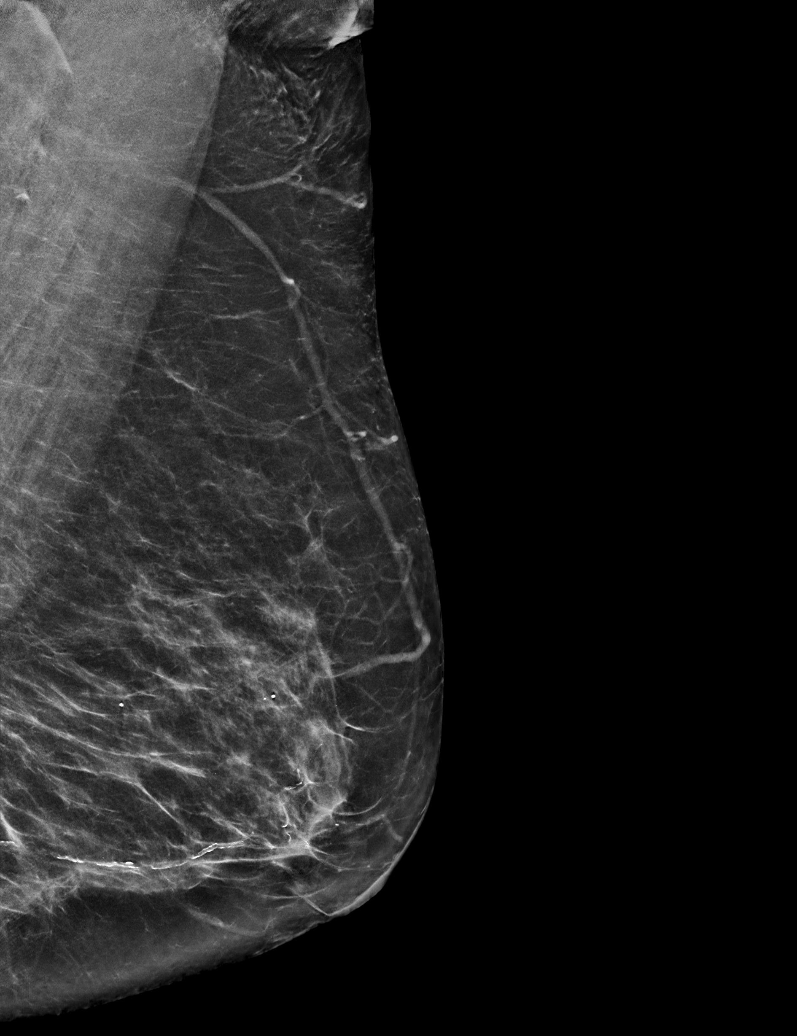

[L CC synth-2D]
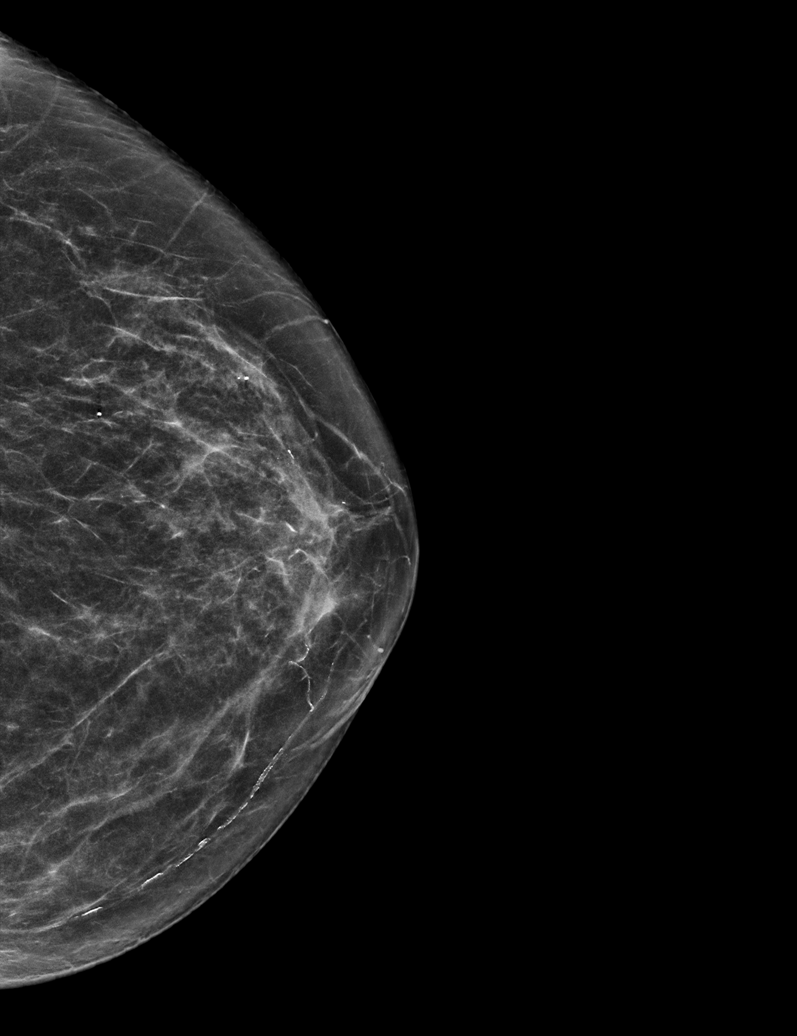

[R CC synth-2D]
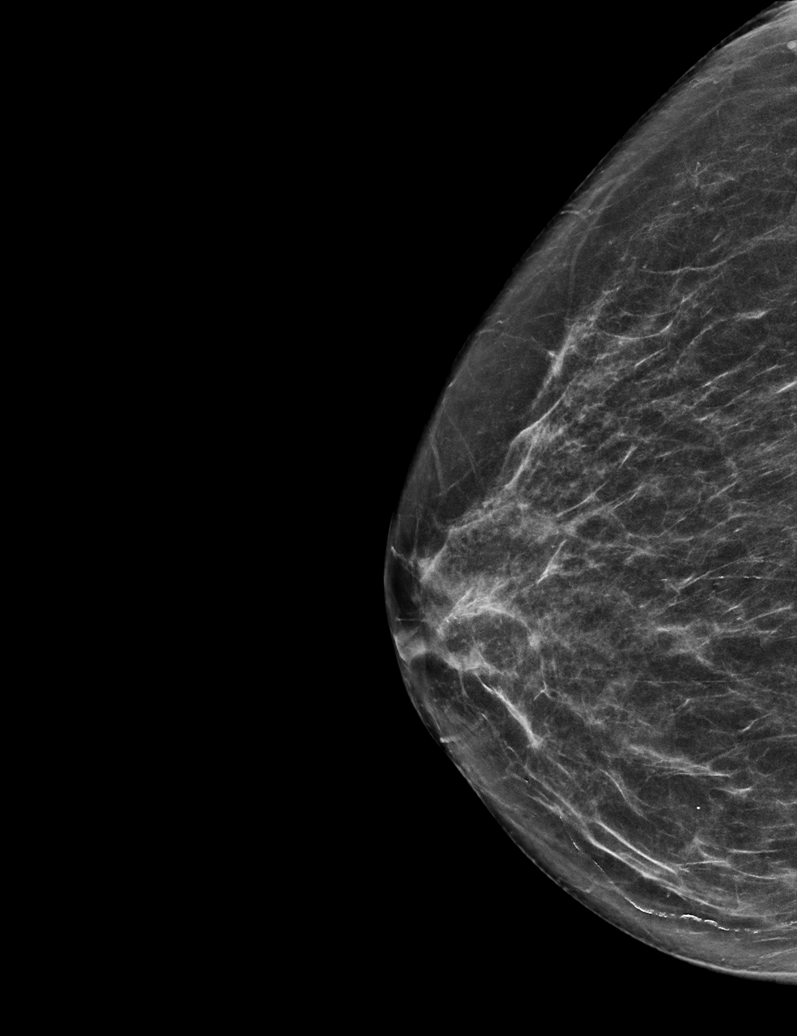

[L MLO synth-2D (2 of 2)]
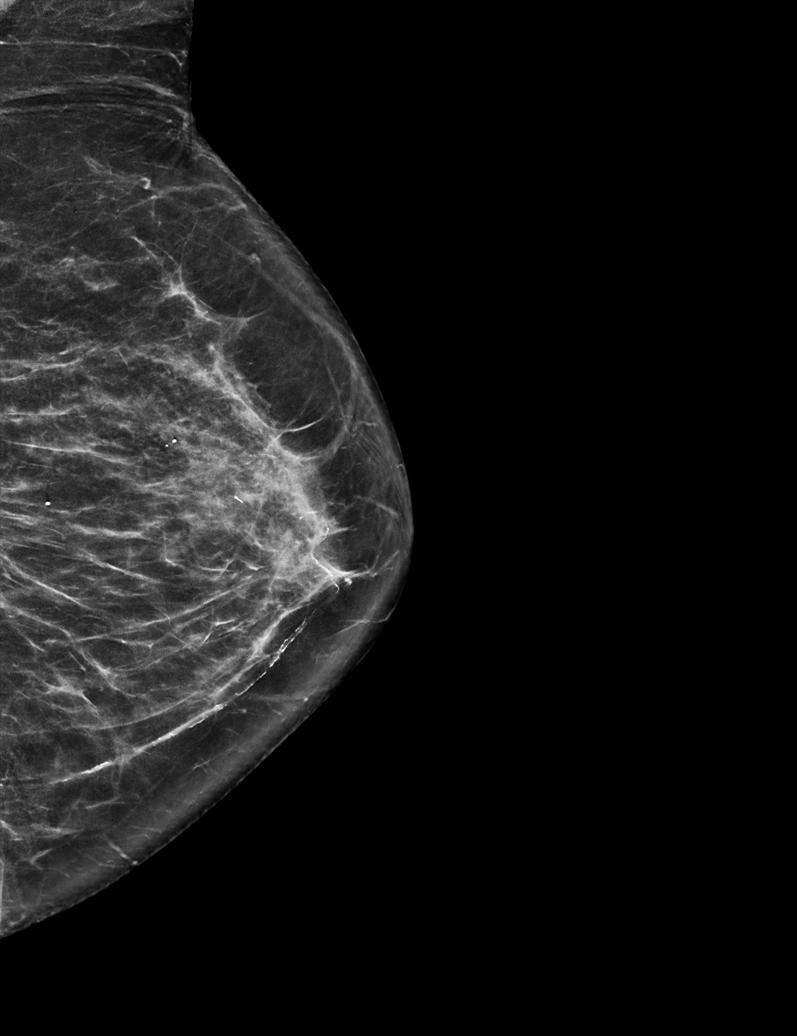

[L MLO tomo · tomo slice 33/66.0]
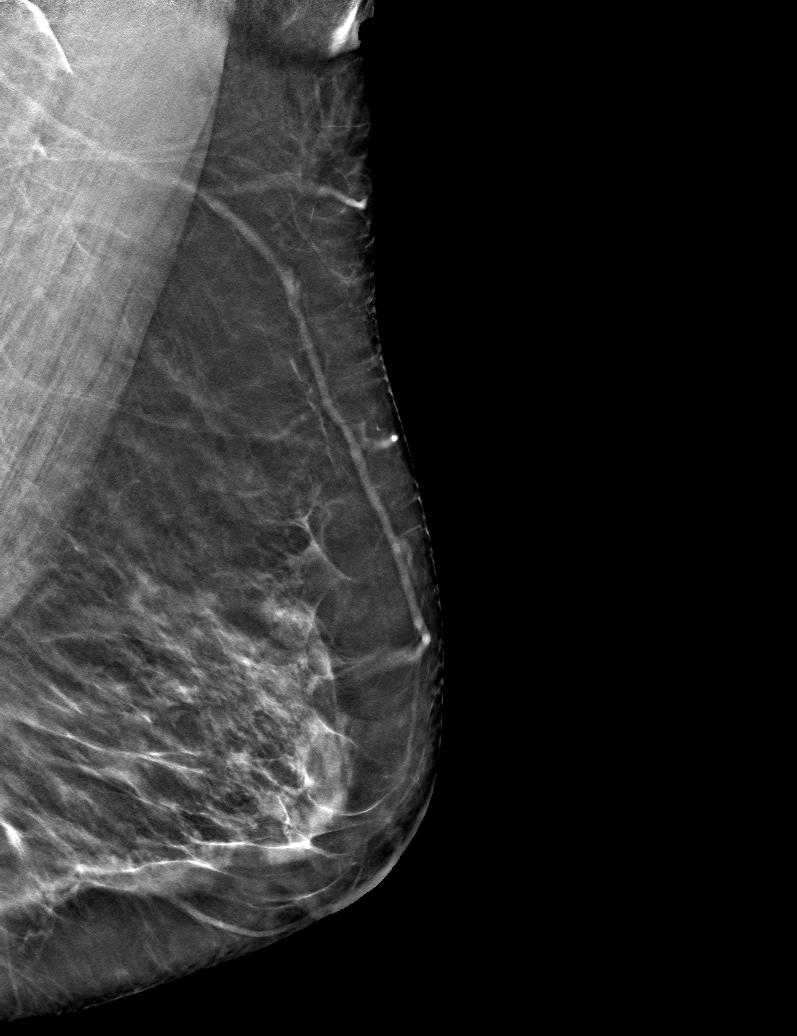

[6 of 30 positions shown; findings below may reference images not displayed]

ACR Breast Density Category b: There are scattered areas of
fibroglandular density.
FINDINGS: There are no findings suspicious for malignancy.
IMPRESSION: No mammographic evidence of malignancy. A result letter of this
screening mammogram will be mailed directly to the patient.

RECOMMENDATION:
Screening mammogram in one year. (Code:51-O-LD2)

BI-RADS CATEGORY  1: Negative.

## 2022-08-04 ENCOUNTER — Ambulatory Visit: Payer: Medicare Other | Admitting: Thoracic Surgery (Cardiothoracic Vascular Surgery)

## 2022-08-04 ENCOUNTER — Ambulatory Visit: Payer: Medicare Other | Attending: Student

## 2022-08-04 DIAGNOSIS — M62838 Other muscle spasm: Secondary | ICD-10-CM | POA: Diagnosis not present

## 2022-08-04 DIAGNOSIS — R279 Unspecified lack of coordination: Secondary | ICD-10-CM | POA: Insufficient documentation

## 2022-08-04 DIAGNOSIS — R293 Abnormal posture: Secondary | ICD-10-CM | POA: Insufficient documentation

## 2022-08-04 DIAGNOSIS — M6281 Muscle weakness (generalized): Secondary | ICD-10-CM | POA: Insufficient documentation

## 2022-08-04 NOTE — Therapy (Signed)
OUTPATIENT PHYSICAL THERAPY TREATMENT NOTE   Patient Name: Laura [name] MRN: [id number] DOB:Jan 23, 1946, 77 y.o., female Today's Date: 08/04/2022  PCP: Shirline Frees, MD REFERRING PROVIDER: Tomasa Blase, NP  END OF SESSION:   PT End of Session - 08/04/22 1221     Visit Number 9    Date for PT Re-Evaluation 09/22/22    Authorization Type Medicare    PT Start Time 1218    PT Stop Time 9983    PT Time Calculation (min) 47 min    Activity Tolerance Patient tolerated treatment well    Behavior During Therapy WFL for tasks assessed/performed                    Past Medical History:  Diagnosis Date   Anemia    Anxiety    Asthma    related to sesonal allergies   Cancer ([place])    Chronic combined systolic and diastolic CHF, NYHA class 2 ([place]) CARDIOLOGIST-  DR [id number]   Coronary artery disease    Depression    History of kidney stones    History of non-ST elevation myocardial infarction (NSTEMI) 11/27/2009   SECONDARY TO TAKOTSUDO SYNDROME (CARDIAC CATH NORMAL)   Hyperlipemia    Hypertension    Hypoglycemia    Hypothyroidism    LBBB (left bundle branch block)    Left ventricular ejection fraction less than 40%    38% PER CARDIOLOGIST NOTE (DR CROITORU)   Memory loss    Mood swings    Myocardial infarction ([place]) 2011   Nonischemic dilated cardiomyopathy ([place])    MODERATELY DEPRESSED LVF;EF 35-45% by Echo 05/27/11   OSA (obstructive sleep apnea) MODERATE PER STUDY 2005   CPAP NONCOMPLIANT   Pre-diabetes    Seasonal allergies    SUI (stress urinary incontinence, female)    Past Surgical History:  Procedure Laterality Date   ABDOMINAL HYSTERECTOMY  06/30/1983   partial   CARDIAC CATHETERIZATION  09-04-1999;  08/25/2004;   12/09/2009  DR CROITORU   NORMAL CORONARIES/  APICAL BALLOONING OF LV CONSISTENT WITH TAKOTSUBO SYMPTOMS/ EF 30-35%   CATARACT EXTRACTION W/ INTRAOCULAR LENS  IMPLANT, BILATERAL     CHOLECYSTECTOMY N/A 09/29/2012   Procedure:  LAPAROSCOPIC CHOLECYSTECTOMY WITH INTRAOPERATIVE CHOLANGIOGRAM;  Surgeon: Adin Hector, MD;  Location: [facility];  Service: General;  Laterality: N/A;   COLONOSCOPY  07/2020   CYSTOSCOPY N/A 09/19/2012   Procedure: Erlene Quan;  Surgeon: Bernestine Amass, MD;  Location[facility];  Service: Urology;  Laterality: N/A;   DILATION AND CURETTAGE OF UTERUS     EYE SURGERY Bilateral    cataract removal   INTERCOSTAL NERVE BLOCK  05/30/2021   Procedure: INTERCOSTAL NERVE BLOCK;  Surgeon: Melrose Nakayama, MD;  Location: [facility];  Service: Thoracic;;   KNEE ARTHROSCOPY W/ MENISCECTOMY  07/27/2011   MEDIAL AND LATERAL   LOBECTOMY  05/30/2021   Procedure: LEFT LOWER LOBECTOMY;  Surgeon: Melrose Nakayama, MD;  Location: [facility];  Service: Thoracic;;   LYMPH NODE DISSECTION  05/30/2021   Procedure: LYMPH NODE DISSECTION;  Surgeon: Melrose Nakayama, MD;  Location: [facility];  Service: Thoracic;;   NASAL SEPTUM SURGERY  02/28/1979   PUBOVAGINAL SLING N/A 09/19/2012   Procedure: SUBURETHRAL Janyth Pupa;  Surgeon: Bernestine Amass, MD;  Location: [facility];  Service: Urology;  Laterality: N/A;   RIGHT URETEROSCOPIC STONE EXTRACTION  08/31/2000   TRANSTHORACIC ECHOCARDIOGRAM  05-27-2011  DR CROITORU   MODERATELY  DEPRESSED LVF DUE TO GLOBAL HYPOKINESIS AND MARKED SYSTOLIC ASYNCHRONY/ EF 85%/ MILD LEFT ATRIAL DILATATION   Patient Active Problem List   Diagnosis Date Noted   Paresthesia 05/28/2022   Chronic low back pain 05/28/2022   Pulmonary embolus, right ([place]) 01/19/2022   Primary adenocarcinoma of lower lobe of left lung ([place]) 07/10/2021   S/P lobectomy of lung 05/30/2021   Multiple pulmonary nodules determined by computed tomography of lung 04/18/2021   GERD (gastroesophageal reflux disease) 01/20/2019   Abnormal chest x-ray 10/20/2018   Cough variant asthma vs UACS/vcd 09/19/2018   DOE (dyspnea on exertion) 09/19/2018   Memory loss 10/14/2015   Mixed  hyperlipidemia 08/23/2014   Major depressive disorder, recurrent episode, severe ([place]) 09/22/2013   Vitamin D Deficiency 07/28/2013   Medication management 07/28/2013   Prediabetes 07/28/2013   Obstructive sleep apnea 05/27/2013   Arrhythmia 04/14/2013   Palpitations 03/12/2013   Delayed gastric emptying 10/18/2012   Steatohepatitis, nonalcoholic 02/77/4128   Female stress incontinence 09/19/2012   Takotsubo syndrome, June 2011.(normal coronaries) 09/12/2012   Cardiomyopathy- EF 45-50% by echo 09/26/12 09/12/2012   Anxiety disorder  09/12/2012   Essential hypertension 09/12/2012   LBBB (left bundle branch block) 09/12/2012   Obesity 09/12/2012   Sleep apnea- non compliant with C-pap 09/12/2012    REFERRING DIAG: K59.00 (ICD-10-CM) - CN (constipation) M62.89 (ICD-10-CM) - PFD (pelvic floor dysfunction)5 N39.3 (ICD-10-CM) - Stress incontinence  THERAPY DIAG:  Abnormal posture  Muscle weakness (generalized)  Other muscle spasm  Unspecified lack of coordination  Rationale for Evaluation and Treatment Rehabilitation  PERTINENT HISTORY: G4P2, Hx lung cancer with lobectomy (05/2021), cardiomyopathy and CHF, pubovaginal sling 2014  PRECAUTIONS: NA  SUBJECTIVE:                                                                                                                                                                                      SUBJECTIVE STATEMENT: Pt states that she has bowel movement every morning. She is working hard on staying hydrated and taking fiber regularly. She is successfully splinting when feels like bowel movement is incomplete. She states that urinary urgency/incontinence is much better with double voiding and going a little more frequently. She did fall again over the weekend, but states that she did not hit her head.   PAIN:  Are you having pain? Yes: NPRS scale: 2/10 Pain location: low back Pain description: aching Aggravating factors:  constant Relieving factors: sometimes pain medication   05/18/22 SUBJECTIVE STATEMENT: Pt states that she has been sick for 3 years. She has low back pain and has to have injections; she has Rt knee pain with arthritis. Constipation started as she  was going through medical dx process in 2022. She was placed on many different medications for constipation that did not do anything. She has had problems with Lt thoracic incision ever since. Urinary issues started again since constipation worsened - she did have bad incontinence but it was better after sling.  Fluid intake: Yes: -     PAIN:  Are you having pain? Yes NPRS scale: 2/10, 10/10 at worst Pain location:  low back   Pain type: aching Pain description: constant    Aggravating factors: walking,standing Relieving factors: pain medication, back brace, sleeping inclined, roll on pain medication   PRECAUTIONS: None   WEIGHT BEARING RESTRICTIONS: No   FALLS:  Has patient fallen in last 6 months? No   LIVING ENVIRONMENT: Lives with: lives with their spouse Lives in: House/apartment     OCCUPATION: retired   PLOF: Independent   PATIENT GOALS: decrease pain, get back to enjoying life, get bowel movements under control   PERTINENT HISTORY:  G4P2, Hx lung cancer with lobectomy (05/2021), cardiomyopathy and CHF, pubovaginal sling 2014 Sexual abuse: No   BOWEL MOVEMENT: Pain with bowel movement: No Type of bowel movement:Frequency usually 1-2x/day and Strain Yes - used to go weeks at a time without having one; better with pessary and metamucil  Fully empty rectum: No Leakage: No Pads: No Fiber supplement: Yes: using metamucil *regularly using enemas to empty rectum   URINATION: Pain with urination: No Fully empty bladder: No -will void more if she sits there.  Stream: Strong and sometimes hesitant Urgency: Yes: - Frequency: a whole lot; wakes at night to go to the bathroom 2-3x; 5x/day Leakage: Urge to void, Coughing,  Sneezing, Laughing, and Exercise Pads: Yes: -   INTERCOURSE: Pain with intercourse:  not currently sexually active Ability to have vaginal penetration:  No Climax: - Marinoff Scale: 3/3   PREGNANCY: Vaginal deliveries 2 Tearing Yes: - C-section deliveries 0 Currently pregnant No   PROLAPSE: Rectocele has been fit with a pessary     OBJECTIVE:  07/14/22: Rt antalgic gait pattern Tendency to breath hold and bear down with activity 5x sit<>stand: 28 seconds   06/15/22: PALPATION:   General  scar tissue restriction over Lt rib cage/side; decreased rib cage expansion Lt side                 External Perineal Exam pale plaques consistent with lichens sclerosis                             Internal Pelvic Floor pessary present, low tone   Patient confirms identification and approves PT to assess internal pelvic floor and treatment Yes - future treatment session   PELVIC MMT:   MMT eval  Vaginal  2/5 strength with bil LE/abdominal contraction - poor coordination of pelvic floor contraction, 6 second endurance, 3 repeat contractions  Internal Anal Sphincter    External Anal Sphincter    Puborectalis    Diastasis Recti  7 inch distortion at, above, and below umbilicus  (Blank rows = not tested)         TONE:  low   PROLAPSE: Pessary in place    05/18/22: DIAGNOSTIC FINDINGS:  CT/PET scan with lung cancer diagnosis   COGNITION: Overall cognitive status: Within functional limits for tasks assessed                          SENSATION: Light  touch: Appears intact Proprioception: Appears intact   MUSCLE LENGTH:     FUNCTIONAL TESTS: 5x STS: 26 seconds;    GAIT: Comments: antalgic gait pattern, decreased trunk rotation and bil hip extension   POSTURE: rounded shoulders, forward head, decreased lumbar lordosis, posterior pelvic tilt, and right elevated iliac crest   LUMBARAROM/PROM: flexion 75%, extension 20%, Rt side bend 0% with severe pain, Lt side bend 50%  with LOB backwards; difficulty with return to standing after flexion        TODAY'S TREATMENT 08/04/22 Exercises: Hip abduction seated green band 10x Seated march 10x Therapeutic activities: HEP review Discussed benefit of PT for low back/balance/bil LE strengthening Urge drill reviewed    TREATMENT 07/28/22 Manual: Lumbar paraspinal soft tissue mobilization in side lying Neuromuscular re-education: Transversus abdominus training with multimodal cues for improved motor control and breath coordination Therapeutic activities: Bladder training/voiding schedule of every 3 hours   TREATMENT 07/20/22 Manual: Lumbar paraspinal soft tissue mobilization in side lying Pt provides verbal consent for internal vaginal/rectal pelvic floor exam. Manual feedback for training in appropriate bearing down/evacuation techniques rectally Neuromuscular re-education: Balloon breathing training Pelvic floor quick flicks with breath coordination  PATIENT EDUCATION:  Education details: see above Person educated: Patient Education method: Consulting civil engineer, Demonstration, Tactile cues, Verbal cues, and Handouts Education comprehension: verbalized understanding   HOME EXERCISE PROGRAM: [id number]   ASSESSMENT:   CLINICAL IMPRESSION: Pt has made excellent progress while in pelvic floor physical therapy demonstrated by having daily bowel movements, no urinary incontinence, and decrease in abdominal discomfort. She states that she does feel independent with all lifestyle modifications and HEP. She was still encouraged to see psychologist to help improve mental health. We discussed frequent falls and that she will benefit from further physical therapy to address balance and weakness in Bil LE in addition to chronic low back pain. She is prepared to D/C from pelvic floor physical therapy at this time.    OBJECTIVE IMPAIRMENTS: decreased activity tolerance, decreased coordination, decreased endurance, decreased  mobility, decreased strength, increased fascial restrictions, increased muscle spasms, impaired tone, postural dysfunction, and pain.    ACTIVITY LIMITATIONS: bending, standing, squatting, transfers, and continence   PARTICIPATION LIMITATIONS: community activity and exercise   PERSONAL FACTORS: 3+ comorbidities: G4P2, Hx lung cancer with lobectomy (05/2021), cardiomyopathy and CHF, pubovaginal sling 2014  are also affecting patient's functional outcome.    REHAB POTENTIAL: Fair due to personal factors/medical history   CLINICAL DECISION MAKING: Stable/uncomplicated   EVALUATION COMPLEXITY: Low     GOALS: Goals reviewed with patient? Yes   SHORT TERM GOALS: Target date: 06/15/22 - updated 06/15/22 - updated 07/14/22, new target 09/22/2022    Pt will be independent with HEP.    Baseline: Goal status: MET 07/14/22   2.  Pt will be independent with use of squatty potty, relaxed toileting mechanics, and improved bowel movement techniques in order to increase ease of bowel movements and complete evacuation.    Baseline:  Goal status: MET 07/14/22   3.  Pt will be independent with the knack, urge suppression technique, and double voiding in order to improve bladder habits and decrease urinary incontinence.    Baseline:  Goal status: MET 07/14/22   4.  Pt will be independent with diaphragmatic breathing and down training activities in order to improve pelvic floor relaxation.   Baseline:  Goal status: MET 07/14/22     LONG TERM GOALS: Target date: 07/13/21 - updated 06/15/22 - updated 07/14/22, new target 09/22/2022    Pt  will be independent with advanced HEP.    Baseline:  Goal status: MET 08/04/22   2.  Pt will demonstrate normal pelvic floor muscle tone and A/ROM, able to achieve 3/5 strength with contractions and 10 sec endurance, in order to provide appropriate lumbopelvic support in functional activities.    Baseline:  Goal status: MET 08/04/22   3.  Pt will report no leaks with  laughing, coughing, sneezing in order to improve comfort with interpersonal relationships and community activities.    Baseline:  Goal status: MET 08/04/22   4.  Pt will have 1 bowel movement a day without straining, use of appropriate body mechanics, and complete emptying without use of enema in order to decrease low back pain, improve urinary incontinence, and improve bowel health. Baseline:  Goal status: MET 08/04/22   5.  Pt will report decrease in highest low back pain to no greater than 6/10 and increase all lumbar A/ROM by 25% in order to improve functional ability.  Baseline: pt thought her low back was improving initially, but due to not being able to get injections as planned she is still having high low back pain that is above 6/10 Goal status: DISCHARGED 08/04/22   6.  Pt decrease 5x STS test time by 5 seconds in order to decrease risk for falls and demonstrate improved functional ability. Baseline: 28 second 07/14/22 Goal status: DISCHARGED   PLAN:   PT FREQUENCY: 1x/week   PT DURATION: 8 weeks   PLANNED INTERVENTIONS: Therapeutic exercises, Therapeutic activity, Neuromuscular re-education, Balance training, Gait training, Patient/Family education, Self Care, Joint mobilization, Dry Needling, Biofeedback, and Manual therapy   PLAN FOR NEXT SESSION: D/C  PHYSICAL THERAPY DISCHARGE SUMMARY  Visits from Start of Care: 9  Current functional level related to goals / functional outcomes: Independent    Remaining deficits: See above   Education / Equipment: HEP   Patient agrees to discharge. Patient goals were partially met. Patient is being discharged due to meeting the stated rehab goals.  Heather Roberts, PT, DPT02/06/241:36 PM

## 2022-08-05 ENCOUNTER — Ambulatory Visit: Payer: Medicare Other | Admitting: Thoracic Surgery (Cardiothoracic Vascular Surgery)

## 2022-08-06 DIAGNOSIS — Z902 Acquired absence of lung [part of]: Secondary | ICD-10-CM | POA: Diagnosis not present

## 2022-08-06 DIAGNOSIS — M47816 Spondylosis without myelopathy or radiculopathy, lumbar region: Secondary | ICD-10-CM | POA: Diagnosis not present

## 2022-08-06 DIAGNOSIS — Z7901 Long term (current) use of anticoagulants: Secondary | ICD-10-CM | POA: Diagnosis not present

## 2022-08-06 DIAGNOSIS — Z79899 Other long term (current) drug therapy: Secondary | ICD-10-CM | POA: Diagnosis not present

## 2022-08-06 DIAGNOSIS — Z85118 Personal history of other malignant neoplasm of bronchus and lung: Secondary | ICD-10-CM | POA: Diagnosis not present

## 2022-08-06 DIAGNOSIS — E039 Hypothyroidism, unspecified: Secondary | ICD-10-CM | POA: Diagnosis not present

## 2022-08-06 DIAGNOSIS — Z9889 Other specified postprocedural states: Secondary | ICD-10-CM | POA: Diagnosis not present

## 2022-08-06 DIAGNOSIS — Z90710 Acquired absence of both cervix and uterus: Secondary | ICD-10-CM | POA: Diagnosis not present

## 2022-08-06 DIAGNOSIS — Z79890 Hormone replacement therapy (postmenopausal): Secondary | ICD-10-CM | POA: Diagnosis not present

## 2022-08-06 DIAGNOSIS — Z86711 Personal history of pulmonary embolism: Secondary | ICD-10-CM | POA: Diagnosis not present

## 2022-08-10 DIAGNOSIS — M4726 Other spondylosis with radiculopathy, lumbar region: Secondary | ICD-10-CM | POA: Diagnosis not present

## 2022-08-10 DIAGNOSIS — M5116 Intervertebral disc disorders with radiculopathy, lumbar region: Secondary | ICD-10-CM | POA: Diagnosis not present

## 2022-08-11 ENCOUNTER — Ambulatory Visit (INDEPENDENT_AMBULATORY_CARE_PROVIDER_SITE_OTHER): Payer: Medicare Other | Admitting: Thoracic Surgery (Cardiothoracic Vascular Surgery)

## 2022-08-11 ENCOUNTER — Ambulatory Visit: Payer: Medicare Other | Admitting: Thoracic Surgery (Cardiothoracic Vascular Surgery)

## 2022-08-11 VITALS — BP 137/80 | HR 75 | Resp 18 | Ht 63.0 in | Wt 158.0 lb

## 2022-08-11 DIAGNOSIS — Z902 Acquired absence of lung [part of]: Secondary | ICD-10-CM | POA: Diagnosis not present

## 2022-08-11 NOTE — Progress Notes (Signed)
HPI: Laura [name] returns for follow-up regarding a stage Ia adenocarcinoma of the lung.  Laura [name] is a 77 year old woman with a history of Takotsubo's, MI, left bundle branch block, ischemic cardiomyopathy, hypertension, hyperlipidemia, hyperglycemia, hypothyroidism, obesity, anxiety, depression, asthma, memory loss, and stage Ia adenocarcinoma of the left lung.  She is a lifelong non-smoker.  She underwent a robotic left lower lobectomy in December 2022 for a stage Ia adenocarcinoma.  She has had issues with intercostal neuralgia since the surgery.  She was on gabapentin but stopped that.  She currently is having injections done by pain clinic at [facility].  She says that does not help either.  She also was having some hip pain and peripheral neuropathy and paresthesias and recently had an MRI of her back.  Past Medical History:  Diagnosis Date   Anemia    Anxiety    Asthma    related to sesonal allergies   Cancer ([place])    Chronic combined systolic and diastolic CHF, NYHA class 2 ([place]) CARDIOLOGIST-  DR [id number]   Coronary artery disease    Depression    History of kidney stones    History of non-ST elevation myocardial infarction (NSTEMI) 11/27/2009   SECONDARY TO TAKOTSUDO SYNDROME (CARDIAC CATH NORMAL)   Hyperlipemia    Hypertension    Hypoglycemia    Hypothyroidism    LBBB (left bundle branch block)    Left ventricular ejection fraction less than 40%    38% PER CARDIOLOGIST NOTE (DR CROITORU)   Memory loss    Mood swings    Myocardial infarction ([place]) 2011   Nonischemic dilated cardiomyopathy ([place])    MODERATELY DEPRESSED LVF;EF 35-45% by Echo 05/27/11   OSA (obstructive sleep apnea) MODERATE PER STUDY 2005   CPAP NONCOMPLIANT   Pre-diabetes    Seasonal allergies    SUI (stress urinary incontinence, female)     Current Outpatient Medications  Medication Sig Dispense Refill   atorvastatin (LIPITOR) 80 MG tablet TAKE 1/2 TABLET BY MOUTH DAILY FOR CHOLESTEROL  (Patient taking differently: Take 40 mg by mouth daily. TAKE 1/2 TABLET BY MOUTH DAILY FOR CHOLESTEROL) 45 tablet 3   diazepam (VALIUM) 5 MG tablet Take 1 tablet (5 mg total) by mouth every 12 (twelve) hours as needed for muscle spasms. (Patient taking differently: Take 10 mg by mouth every 12 (twelve) hours as needed for muscle spasms. Take 10 mg by mouth) 10 tablet 0   hydrALAZINE (APRESOLINE) 50 MG tablet Take 1 tablet (50 mg total) by mouth 2 (two) times daily. 180 tablet 3   irbesartan (AVAPRO) 300 MG tablet Take 300 mg by mouth daily.     levothyroxine (SYNTHROID, LEVOTHROID) 88 MCG tablet Take 88 mcg by mouth daily before breakfast.     Melatonin 10 MG TABS Take 5 mg by mouth at bedtime as needed.     metoprolol succinate (TOPROL-XL) 50 MG 24 hr tablet TAKE ONE AND ONE-HALF TABLETS BY MOUTH DAILY 90 tablet 3   NUVIGIL 250 MG tablet Take 250 mg by mouth daily as needed (energy boost).  3   No current facility-administered medications for this visit.    Physical Exam BP 137/80 (BP Location: Right Arm, Patient Position: Sitting)   Pulse 75   Resp 18   Ht 5\' 3"  (1.6 m)   Wt 158 lb (71.7 kg)   SpO2 98% Comment: RA  BMI 27.21 kg/m  77 year old woman in no acute distress Alert and oriented x 3 with no focal deficits  Lungs diminished at left base but otherwise clear Cardiac regular rate and rhythm Incisions well-healed No cervical or supraclavicular adenopathy  Diagnostic Tests: CT CHEST WITH CONTRAST   TECHNIQUE: Multidetector CT imaging of the chest was performed during intravenous contrast administration.   RADIATION DOSE REDUCTION: This exam was performed according to the departmental dose-optimization program which includes automated exposure control, adjustment of the mA and/or kV according to patient size and/or use of iterative reconstruction technique.   CONTRAST:  53mL OMNIPAQUE IOHEXOL 300 MG/ML  SOLN   COMPARISON:  Chest CT 01/13/2022 and 05/12/2020.    FINDINGS: Cardiovascular: No acute vascular findings. There is suboptimal opacification of the pulmonary arteries, but no evidence of residual/recurrent pulmonary embolism. Diffuse aortic and branch vessel atherosclerosis. The heart size is normal. There is no pericardial effusion.   Mediastinum/Nodes: There are no enlarged mediastinal, hilar or axillary lymph nodes. The thyroid gland, trachea and esophagus demonstrate no significant findings.   Lungs/Pleura: No pleural effusion or pneumothorax. Stable postsurgical changes related to previous left lower lobectomy. Stable mild emphysematous changes with scattered subpleural reticulation and scarring. Stable calcified right lower lobe granuloma. No suspicious pulmonary nodules.   Upper abdomen: The visualized upper abdomen appears stable, without significant findings.   Musculoskeletal/Chest wall: There is no chest wall mass or suspicious osseous finding. Multilevel thoracic spondylosis and ankylosis as before.   IMPRESSION: 1. Stable chest CT status post left lower lobectomy. No evidence of local recurrence or metastatic disease. 2.  Aortic Atherosclerosis (ICD10-I70.0).     Electronically Signed   By: Richardean Sale M.D.   On: 07/22/2022 08:40   I personally reviewed the CT images.  No evidence of recurrent disease.  Impression: Laura [name] is a 77 year old woman with a history of Takotsubo's, MI, left bundle branch block, ischemic cardiomyopathy, hypertension, hyperlipidemia, hyperglycemia, hypothyroidism, obesity, anxiety, depression, asthma, memory loss, and stage Ia adenocarcinoma of the left lung.  She is a lifelong non-smoker.  Stage Ia adenocarcinoma left lower lobe-now a little over a year out from a left lower lobectomy.  No evidence of recurrent disease.  Has another CT scheduled for 6 months with Dr. Julien Nordmann.  Intercostal neuralgia-remains problematic.  She is no longer any medications.  She is having some  "needles" placed in her back at [place].  Also scheduled to see a neurologist soon regarding some back and leg issues.  I think she would probably benefit from either gabapentin or Lyrica, but will defer to the pain doctors.  Plan: Follow-up with pain clinic Follow-up with Dr. Julien Nordmann I will see her back in a year for 2-year follow-up.  Melrose Nakayama, MD [facility] [phone]

## 2022-08-26 ENCOUNTER — Encounter: Payer: Medicare Other | Admitting: Neurology

## 2022-08-27 DIAGNOSIS — G629 Polyneuropathy, unspecified: Secondary | ICD-10-CM | POA: Diagnosis not present

## 2022-08-27 DIAGNOSIS — M5440 Lumbago with sciatica, unspecified side: Secondary | ICD-10-CM | POA: Diagnosis not present

## 2022-09-08 DIAGNOSIS — I10 Essential (primary) hypertension: Secondary | ICD-10-CM | POA: Diagnosis not present

## 2022-09-08 DIAGNOSIS — F411 Generalized anxiety disorder: Secondary | ICD-10-CM | POA: Diagnosis not present

## 2022-09-08 DIAGNOSIS — I255 Ischemic cardiomyopathy: Secondary | ICD-10-CM | POA: Diagnosis not present

## 2022-09-08 DIAGNOSIS — G8929 Other chronic pain: Secondary | ICD-10-CM | POA: Diagnosis not present

## 2022-09-08 DIAGNOSIS — Z86711 Personal history of pulmonary embolism: Secondary | ICD-10-CM | POA: Diagnosis not present

## 2022-09-08 DIAGNOSIS — E039 Hypothyroidism, unspecified: Secondary | ICD-10-CM | POA: Diagnosis not present

## 2022-09-08 DIAGNOSIS — R1084 Generalized abdominal pain: Secondary | ICD-10-CM | POA: Diagnosis not present

## 2022-09-08 DIAGNOSIS — Z85118 Personal history of other malignant neoplasm of bronchus and lung: Secondary | ICD-10-CM | POA: Diagnosis not present

## 2022-09-08 DIAGNOSIS — R7303 Prediabetes: Secondary | ICD-10-CM | POA: Diagnosis not present

## 2022-09-08 DIAGNOSIS — F331 Major depressive disorder, recurrent, moderate: Secondary | ICD-10-CM | POA: Diagnosis not present

## 2022-09-08 DIAGNOSIS — E7800 Pure hypercholesterolemia, unspecified: Secondary | ICD-10-CM | POA: Diagnosis not present

## 2022-09-23 DIAGNOSIS — M4727 Other spondylosis with radiculopathy, lumbosacral region: Secondary | ICD-10-CM | POA: Diagnosis not present

## 2022-09-23 DIAGNOSIS — M5440 Lumbago with sciatica, unspecified side: Secondary | ICD-10-CM | POA: Diagnosis not present

## 2022-09-28 DIAGNOSIS — M5459 Other low back pain: Secondary | ICD-10-CM | POA: Diagnosis not present

## 2022-09-28 DIAGNOSIS — M47816 Spondylosis without myelopathy or radiculopathy, lumbar region: Secondary | ICD-10-CM | POA: Diagnosis not present

## 2022-09-29 DIAGNOSIS — R32 Unspecified urinary incontinence: Secondary | ICD-10-CM | POA: Diagnosis not present

## 2022-10-02 DIAGNOSIS — M25561 Pain in right knee: Secondary | ICD-10-CM | POA: Diagnosis not present

## 2022-10-02 DIAGNOSIS — R262 Difficulty in walking, not elsewhere classified: Secondary | ICD-10-CM | POA: Diagnosis not present

## 2022-10-02 DIAGNOSIS — M1711 Unilateral primary osteoarthritis, right knee: Secondary | ICD-10-CM | POA: Diagnosis not present

## 2022-10-02 DIAGNOSIS — M25661 Stiffness of right knee, not elsewhere classified: Secondary | ICD-10-CM | POA: Diagnosis not present

## 2022-10-09 DIAGNOSIS — M25561 Pain in right knee: Secondary | ICD-10-CM | POA: Diagnosis not present

## 2022-10-09 DIAGNOSIS — M25661 Stiffness of right knee, not elsewhere classified: Secondary | ICD-10-CM | POA: Diagnosis not present

## 2022-10-09 DIAGNOSIS — M1711 Unilateral primary osteoarthritis, right knee: Secondary | ICD-10-CM | POA: Diagnosis not present

## 2022-10-09 DIAGNOSIS — R262 Difficulty in walking, not elsewhere classified: Secondary | ICD-10-CM | POA: Diagnosis not present

## 2022-10-16 DIAGNOSIS — M25561 Pain in right knee: Secondary | ICD-10-CM | POA: Diagnosis not present

## 2022-10-16 DIAGNOSIS — M1711 Unilateral primary osteoarthritis, right knee: Secondary | ICD-10-CM | POA: Diagnosis not present

## 2022-10-16 DIAGNOSIS — R262 Difficulty in walking, not elsewhere classified: Secondary | ICD-10-CM | POA: Diagnosis not present

## 2022-10-16 DIAGNOSIS — M25661 Stiffness of right knee, not elsewhere classified: Secondary | ICD-10-CM | POA: Diagnosis not present

## 2022-10-20 DIAGNOSIS — M5440 Lumbago with sciatica, unspecified side: Secondary | ICD-10-CM | POA: Diagnosis not present

## 2022-10-20 DIAGNOSIS — G629 Polyneuropathy, unspecified: Secondary | ICD-10-CM | POA: Diagnosis not present

## 2022-10-23 DIAGNOSIS — M25561 Pain in right knee: Secondary | ICD-10-CM | POA: Diagnosis not present

## 2022-10-23 DIAGNOSIS — M25661 Stiffness of right knee, not elsewhere classified: Secondary | ICD-10-CM | POA: Diagnosis not present

## 2022-10-23 DIAGNOSIS — R262 Difficulty in walking, not elsewhere classified: Secondary | ICD-10-CM | POA: Diagnosis not present

## 2022-10-23 DIAGNOSIS — M1711 Unilateral primary osteoarthritis, right knee: Secondary | ICD-10-CM | POA: Diagnosis not present

## 2022-10-30 DIAGNOSIS — R262 Difficulty in walking, not elsewhere classified: Secondary | ICD-10-CM | POA: Diagnosis not present

## 2022-10-30 DIAGNOSIS — M25661 Stiffness of right knee, not elsewhere classified: Secondary | ICD-10-CM | POA: Diagnosis not present

## 2022-10-30 DIAGNOSIS — M1711 Unilateral primary osteoarthritis, right knee: Secondary | ICD-10-CM | POA: Diagnosis not present

## 2022-10-30 DIAGNOSIS — M25561 Pain in right knee: Secondary | ICD-10-CM | POA: Diagnosis not present

## 2022-11-05 DIAGNOSIS — M25662 Stiffness of left knee, not elsewhere classified: Secondary | ICD-10-CM | POA: Diagnosis not present

## 2022-11-05 DIAGNOSIS — R262 Difficulty in walking, not elsewhere classified: Secondary | ICD-10-CM | POA: Diagnosis not present

## 2022-11-05 DIAGNOSIS — M25562 Pain in left knee: Secondary | ICD-10-CM | POA: Diagnosis not present

## 2022-11-05 DIAGNOSIS — M1712 Unilateral primary osteoarthritis, left knee: Secondary | ICD-10-CM | POA: Diagnosis not present

## 2022-11-06 DIAGNOSIS — A499 Bacterial infection, unspecified: Secondary | ICD-10-CM | POA: Diagnosis not present

## 2022-11-06 DIAGNOSIS — R300 Dysuria: Secondary | ICD-10-CM | POA: Diagnosis not present

## 2022-11-06 DIAGNOSIS — N390 Urinary tract infection, site not specified: Secondary | ICD-10-CM | POA: Diagnosis not present

## 2022-11-13 DIAGNOSIS — M25662 Stiffness of left knee, not elsewhere classified: Secondary | ICD-10-CM | POA: Diagnosis not present

## 2022-11-13 DIAGNOSIS — M1712 Unilateral primary osteoarthritis, left knee: Secondary | ICD-10-CM | POA: Diagnosis not present

## 2022-11-13 DIAGNOSIS — R262 Difficulty in walking, not elsewhere classified: Secondary | ICD-10-CM | POA: Diagnosis not present

## 2022-11-13 DIAGNOSIS — M25562 Pain in left knee: Secondary | ICD-10-CM | POA: Diagnosis not present

## 2022-11-20 DIAGNOSIS — M1712 Unilateral primary osteoarthritis, left knee: Secondary | ICD-10-CM | POA: Diagnosis not present

## 2022-11-20 DIAGNOSIS — M25662 Stiffness of left knee, not elsewhere classified: Secondary | ICD-10-CM | POA: Diagnosis not present

## 2022-11-20 DIAGNOSIS — M25562 Pain in left knee: Secondary | ICD-10-CM | POA: Diagnosis not present

## 2022-11-20 DIAGNOSIS — R262 Difficulty in walking, not elsewhere classified: Secondary | ICD-10-CM | POA: Diagnosis not present

## 2022-11-26 DIAGNOSIS — M25662 Stiffness of left knee, not elsewhere classified: Secondary | ICD-10-CM | POA: Diagnosis not present

## 2022-11-26 DIAGNOSIS — R262 Difficulty in walking, not elsewhere classified: Secondary | ICD-10-CM | POA: Diagnosis not present

## 2022-11-26 DIAGNOSIS — M25562 Pain in left knee: Secondary | ICD-10-CM | POA: Diagnosis not present

## 2022-11-26 DIAGNOSIS — M1712 Unilateral primary osteoarthritis, left knee: Secondary | ICD-10-CM | POA: Diagnosis not present

## 2022-11-30 DIAGNOSIS — R32 Unspecified urinary incontinence: Secondary | ICD-10-CM | POA: Diagnosis not present

## 2022-12-03 DIAGNOSIS — M25661 Stiffness of right knee, not elsewhere classified: Secondary | ICD-10-CM | POA: Diagnosis not present

## 2022-12-03 DIAGNOSIS — M25561 Pain in right knee: Secondary | ICD-10-CM | POA: Diagnosis not present

## 2022-12-03 DIAGNOSIS — R262 Difficulty in walking, not elsewhere classified: Secondary | ICD-10-CM | POA: Diagnosis not present

## 2022-12-03 DIAGNOSIS — M1711 Unilateral primary osteoarthritis, right knee: Secondary | ICD-10-CM | POA: Diagnosis not present

## 2022-12-10 DIAGNOSIS — M25562 Pain in left knee: Secondary | ICD-10-CM | POA: Diagnosis not present

## 2022-12-10 DIAGNOSIS — M1712 Unilateral primary osteoarthritis, left knee: Secondary | ICD-10-CM | POA: Diagnosis not present

## 2022-12-10 DIAGNOSIS — M25662 Stiffness of left knee, not elsewhere classified: Secondary | ICD-10-CM | POA: Diagnosis not present

## 2022-12-10 DIAGNOSIS — R262 Difficulty in walking, not elsewhere classified: Secondary | ICD-10-CM | POA: Diagnosis not present

## 2022-12-10 DIAGNOSIS — N812 Incomplete uterovaginal prolapse: Secondary | ICD-10-CM | POA: Diagnosis not present

## 2022-12-11 DIAGNOSIS — N819 Female genital prolapse, unspecified: Secondary | ICD-10-CM | POA: Diagnosis not present

## 2022-12-24 DIAGNOSIS — M7918 Myalgia, other site: Secondary | ICD-10-CM | POA: Diagnosis not present

## 2022-12-24 DIAGNOSIS — M47816 Spondylosis without myelopathy or radiculopathy, lumbar region: Secondary | ICD-10-CM | POA: Diagnosis not present

## 2022-12-24 DIAGNOSIS — M5136 Other intervertebral disc degeneration, lumbar region: Secondary | ICD-10-CM | POA: Diagnosis not present

## 2022-12-24 DIAGNOSIS — G8929 Other chronic pain: Secondary | ICD-10-CM | POA: Diagnosis not present

## 2022-12-24 DIAGNOSIS — G894 Chronic pain syndrome: Secondary | ICD-10-CM | POA: Diagnosis not present

## 2022-12-24 DIAGNOSIS — M48061 Spinal stenosis, lumbar region without neurogenic claudication: Secondary | ICD-10-CM | POA: Diagnosis not present

## 2022-12-24 DIAGNOSIS — M5459 Other low back pain: Secondary | ICD-10-CM | POA: Diagnosis not present

## 2023-01-04 DIAGNOSIS — M5136 Other intervertebral disc degeneration, lumbar region: Secondary | ICD-10-CM | POA: Diagnosis not present

## 2023-01-04 DIAGNOSIS — M47816 Spondylosis without myelopathy or radiculopathy, lumbar region: Secondary | ICD-10-CM | POA: Diagnosis not present

## 2023-01-04 DIAGNOSIS — M5459 Other low back pain: Secondary | ICD-10-CM | POA: Diagnosis not present

## 2023-01-12 DIAGNOSIS — N819 Female genital prolapse, unspecified: Secondary | ICD-10-CM | POA: Diagnosis not present

## 2023-01-13 DIAGNOSIS — M47816 Spondylosis without myelopathy or radiculopathy, lumbar region: Secondary | ICD-10-CM | POA: Diagnosis not present

## 2023-01-13 DIAGNOSIS — M5459 Other low back pain: Secondary | ICD-10-CM | POA: Diagnosis not present

## 2023-01-13 DIAGNOSIS — M5136 Other intervertebral disc degeneration, lumbar region: Secondary | ICD-10-CM | POA: Diagnosis not present

## 2023-01-18 ENCOUNTER — Other Ambulatory Visit: Payer: Medicare Other

## 2023-01-19 ENCOUNTER — Other Ambulatory Visit: Payer: Self-pay

## 2023-01-19 ENCOUNTER — Inpatient Hospital Stay: Payer: Medicare Other | Attending: Internal Medicine

## 2023-01-19 ENCOUNTER — Ambulatory Visit (HOSPITAL_COMMUNITY)
Admission: RE | Admit: 2023-01-19 | Discharge: 2023-01-19 | Disposition: A | Discharge status: Home / Self Care | Payer: Medicare Other | Source: Ambulatory Visit | Attending: Internal Medicine | Admitting: Internal Medicine

## 2023-01-19 DIAGNOSIS — C3490 Malignant neoplasm of unspecified part of unspecified bronchus or lung: Secondary | ICD-10-CM | POA: Insufficient documentation

## 2023-01-19 DIAGNOSIS — Z85118 Personal history of other malignant neoplasm of bronchus and lung: Secondary | ICD-10-CM | POA: Insufficient documentation

## 2023-01-19 DIAGNOSIS — G8929 Other chronic pain: Secondary | ICD-10-CM | POA: Insufficient documentation

## 2023-01-19 DIAGNOSIS — Z86711 Personal history of pulmonary embolism: Secondary | ICD-10-CM | POA: Insufficient documentation

## 2023-01-19 DIAGNOSIS — Z902 Acquired absence of lung [part of]: Secondary | ICD-10-CM | POA: Insufficient documentation

## 2023-01-19 DIAGNOSIS — J432 Centrilobular emphysema: Secondary | ICD-10-CM | POA: Diagnosis not present

## 2023-01-19 DIAGNOSIS — Z7901 Long term (current) use of anticoagulants: Secondary | ICD-10-CM | POA: Insufficient documentation

## 2023-01-19 LAB — CMP (CANCER CENTER ONLY)
ALT: 19 U/L (ref 0–44)
AST: 22 U/L (ref 15–41)
Albumin: 4 g/dL (ref 3.5–5.0)
Alkaline Phosphatase: 65 U/L (ref 38–126)
Anion gap: 7 (ref 5–15)
BUN: 13 mg/dL (ref 8–23)
CO2: 28 mmol/L (ref 22–32)
Calcium: 9.5 mg/dL (ref 8.9–10.3)
Chloride: 106 mmol/L (ref 98–111)
Creatinine: 0.76 mg/dL (ref 0.44–1.00)
GFR, Estimated: >60 mL/min (ref >60)
Glucose, Bld: 85 mg/dL (ref 70–99)
Potassium: 3.6 mmol/L (ref 3.5–5.1)
Sodium: 141 mmol/L (ref 135–145)
Total Bilirubin: 0.4 mg/dL (ref 0.3–1.2)
Total Protein: 6.6 g/dL (ref 6.5–8.1)

## 2023-01-19 LAB — CBC WITH DIFFERENTIAL (CANCER CENTER ONLY)
Abs Immature Granulocytes: 0.02 10*3/uL (ref 0.00–0.07)
Basophils Absolute: 0.1 10*3/uL (ref 0.0–0.1)
Basophils Relative: 1 %
Eosinophils Absolute: 0.1 10*3/uL (ref 0.0–0.5)
Eosinophils Relative: 1 %
HCT: 36.7 % (ref 36.0–46.0)
Hemoglobin: 12.3 g/dL (ref 12.0–15.0)
Immature Granulocytes: 0 %
Lymphocytes Relative: 25 %
Lymphs Abs: 1.7 10*3/uL (ref 0.7–4.0)
MCH: 26.3 pg (ref 26.0–34.0)
MCHC: 33.5 g/dL (ref 30.0–36.0)
MCV: 78.6 fL — ABNORMAL LOW (ref 80.0–100.0)
Monocytes Absolute: 0.6 10*3/uL (ref 0.1–1.0)
Monocytes Relative: 9 %
Neutro Abs: 4.3 10*3/uL (ref 1.7–7.7)
Neutrophils Relative %: 64 %
Platelet Count: 233 10*3/uL (ref 150–400)
RBC: 4.67 MIL/uL (ref 3.87–5.11)
RDW: 15.6 % — ABNORMAL HIGH (ref 11.5–15.5)
WBC Count: 6.8 10*3/uL (ref 4.0–10.5)
nRBC: 0 % (ref 0.0–0.2)

## 2023-01-19 MED ORDER — SODIUM CHLORIDE (PF) 0.9 % IJ SOLN
INTRAMUSCULAR | Status: AC
  Filled 2023-01-19: qty 50

## 2023-01-19 MED ORDER — IOHEXOL 300 MG/ML  SOLN
75.0000 mL | Freq: Once | INTRAMUSCULAR | Status: AC | PRN
  Administered 2023-01-19: 75 mL via INTRAVENOUS

## 2023-01-20 ENCOUNTER — Inpatient Hospital Stay (HOSPITAL_BASED_OUTPATIENT_CLINIC_OR_DEPARTMENT_OTHER): Payer: Medicare Other | Admitting: Internal Medicine

## 2023-01-20 VITALS — BP 156/69 | HR 70 | Temp 98.4°F | Resp 17 | Ht 63.0 in | Wt 158.5 lb

## 2023-01-20 DIAGNOSIS — G8929 Other chronic pain: Secondary | ICD-10-CM | POA: Diagnosis not present

## 2023-01-20 DIAGNOSIS — Z85118 Personal history of other malignant neoplasm of bronchus and lung: Secondary | ICD-10-CM | POA: Diagnosis not present

## 2023-01-20 DIAGNOSIS — C3490 Malignant neoplasm of unspecified part of unspecified bronchus or lung: Secondary | ICD-10-CM

## 2023-01-20 DIAGNOSIS — Z7901 Long term (current) use of anticoagulants: Secondary | ICD-10-CM | POA: Diagnosis not present

## 2023-01-20 DIAGNOSIS — Z902 Acquired absence of lung [part of]: Secondary | ICD-10-CM | POA: Diagnosis not present

## 2023-01-20 DIAGNOSIS — Z86711 Personal history of pulmonary embolism: Secondary | ICD-10-CM | POA: Diagnosis not present

## 2023-01-20 NOTE — Progress Notes (Signed)
[facility] [phone]   [phone]  OFFICE PROGRESS NOTE  Noberto Retort, MD [phone] [address] [place] [address]  DIAGNOSIS:  1) Stage IA (T1c, N0, M0) non-small cell lung cancer, adenocarcinoma diagnosed in December 2022 2) incidental finding of pulmonary embolus within the right middle lobar pulmonary artery on CT scan of 01/13/2022  PRIOR THERAPY:   1) Status post left lower lobectomy with lymph node dissection under the care of Dr. Dorris Fetch on May 30, 2021. 2) Eliquis 5 mg p.o. twice daily started January 13, 2022.  She will complete 6 months of her treatment this month.  CURRENT THERAPY: Observation  INTERVAL HISTORY: Laura [name] 77 y.o. female returns to the clinic today for 6 months follow-up visit accompanied by her husband.  The patient is feeling fine today with no concerning complaints except for chronic back pain.  She denied having any current chest pain, shortness of breath except with exertion with no cough or hemoptysis.  She has no nausea, vomiting, diarrhea or constipation.  She has no headache or visual changes.  She denied having any recent weight loss or night sweats.  She is here today for evaluation with repeat CT scan of the chest for restaging of her disease.  MEDICAL HISTORY: Past Medical History:  Diagnosis Date   Anemia    Anxiety    Asthma    related to sesonal allergies   Cancer (HCC)    Chronic combined systolic and diastolic CHF, NYHA class 2 (HCC) CARDIOLOGIST-  DR GNFAOZHY   Coronary artery disease    Depression    History of kidney stones    History of non-ST elevation myocardial infarction (NSTEMI) 11/27/2009   SECONDARY TO TAKOTSUDO SYNDROME (CARDIAC CATH NORMAL)   Hyperlipemia    Hypertension    Hypoglycemia    Hypothyroidism    LBBB (left bundle branch block)    Left ventricular ejection fraction less than 40%    38% PER CARDIOLOGIST NOTE (DR CROITORU)   Memory loss     Mood swings    Myocardial infarction (HCC) 2011   Nonischemic dilated cardiomyopathy (HCC)    MODERATELY DEPRESSED LVF;EF 35-45% by Echo 05/27/11   OSA (obstructive sleep apnea) MODERATE PER STUDY 2005   CPAP NONCOMPLIANT   Pre-diabetes    Seasonal allergies    SUI (stress urinary incontinence, female)     ALLERGIES:  is allergic to codeine, ciprofloxacin, ace inhibitors, citalopram, fetzima [levomilnacipran], lasix [furosemide], nsaids, sulfamethoxazole-trimethoprim, and xanax xr [alprazolam er].  MEDICATIONS:  Current Outpatient Medications  Medication Sig Dispense Refill   atorvastatin (LIPITOR) 80 MG tablet TAKE 1/2 TABLET BY MOUTH DAILY FOR CHOLESTEROL (Patient taking differently: Take 40 mg by mouth daily. TAKE 1/2 TABLET BY MOUTH DAILY FOR CHOLESTEROL) 45 tablet 3   diazepam (VALIUM) 5 MG tablet Take 1 tablet (5 mg total) by mouth every 12 (twelve) hours as needed for muscle spasms. (Patient taking differently: Take 10 mg by mouth every 12 (twelve) hours as needed for muscle spasms. Take 10 mg by mouth) 10 tablet 0   hydrALAZINE (APRESOLINE) 50 MG tablet Take 1 tablet (50 mg total) by mouth 2 (two) times daily. 180 tablet 3   irbesartan (AVAPRO) 300 MG tablet Take 300 mg by mouth daily.     levothyroxine (SYNTHROID, LEVOTHROID) 88 MCG tablet Take 88 mcg by mouth daily before breakfast.     Melatonin 10 MG TABS Take 5 mg by mouth at  bedtime as needed.     metoprolol succinate (TOPROL-XL) 50 MG 24 hr tablet TAKE ONE AND ONE-HALF TABLETS BY MOUTH DAILY 90 tablet 3   NUVIGIL 250 MG tablet Take 250 mg by mouth daily as needed (energy boost).  3   No current facility-administered medications for this visit.    SURGICAL HISTORY:  Past Surgical History:  Procedure Laterality Date   ABDOMINAL HYSTERECTOMY  06/30/1983   partial   CARDIAC CATHETERIZATION  09-04-1999;  08/25/2004;   12/09/2009  DR CROITORU   NORMAL CORONARIES/  APICAL BALLOONING OF LV CONSISTENT WITH TAKOTSUBO SYMPTOMS/ EF  30-35%   CATARACT EXTRACTION W/ INTRAOCULAR LENS  IMPLANT, BILATERAL     CHOLECYSTECTOMY N/A 09/29/2012   Procedure: LAPAROSCOPIC CHOLECYSTECTOMY WITH INTRAOPERATIVE CHOLANGIOGRAM;  Surgeon: Ardeth Sportsman, MD;  Location: [facility] OR;  Service: General;  Laterality: N/A;   COLONOSCOPY  07/2020   CYSTOSCOPY N/A 09/19/2012   Procedure: Derinda Late;  Surgeon: Valetta Fuller, MD;  Location: [facility];  Service: Urology;  Laterality: N/A;   DILATION AND CURETTAGE OF UTERUS     EYE SURGERY Bilateral    cataract removal   INTERCOSTAL NERVE BLOCK  05/30/2021   Procedure: INTERCOSTAL NERVE BLOCK;  Surgeon: Loreli Slot, MD;  Location: [facility] OR;  Service: Thoracic;;   KNEE ARTHROSCOPY W/ MENISCECTOMY  07/27/2011   MEDIAL AND LATERAL   LOBECTOMY  05/30/2021   Procedure: LEFT LOWER LOBECTOMY;  Surgeon: Loreli Slot, MD;  Location: [facility] OR;  Service: Thoracic;;   LYMPH NODE DISSECTION  05/30/2021   Procedure: LYMPH NODE DISSECTION;  Surgeon: Loreli Slot, MD;  Location: [facility] OR;  Service: Thoracic;;   NASAL SEPTUM SURGERY  02/28/1979   PUBOVAGINAL SLING N/A 09/19/2012   Procedure: SUBURETHRAL Elio Forget;  Surgeon: Valetta Fuller, MD;  Location[facility];  Service: Urology;  Laterality: N/A;   RIGHT URETEROSCOPIC STONE EXTRACTION  08/31/2000   TRANSTHORACIC ECHOCARDIOGRAM  05-27-2011  DR CROITORU   MODERATELY DEPRESSED LVF DUE TO GLOBAL HYPOKINESIS AND MARKED SYSTOLIC ASYNCHRONY/ EF 38%/ MILD LEFT ATRIAL DILATATION    REVIEW OF SYSTEMS:  A comprehensive review of systems was negative.   PHYSICAL EXAMINATION: General appearance: alert, cooperative, and no distress Head: Normocephalic, without obvious abnormality, atraumatic Neck: no adenopathy, no JVD, supple, symmetrical, trachea midline, and thyroid not enlarged, symmetric, no tenderness/mass/nodules Lymph nodes: Cervical, supraclavicular, and axillary nodes normal. Resp: clear to  auscultation bilaterally Back: symmetric, no curvature. ROM normal. No CVA tenderness. Cardio: regular rate and rhythm, S1, S2 normal, no murmur, click, rub or gallop GI: soft, non-tender; bowel sounds normal; no masses,  no organomegaly Extremities: extremities normal, atraumatic, no cyanosis or edema  ECOG PERFORMANCE STATUS: 1 - Symptomatic but completely ambulatory  Blood pressure (!) 159/67, pulse 70, temperature 98.4 F (36.9 C), temperature source Oral, resp. rate 17, height 5\' 3"  (1.6 m), weight 158 lb 8 oz (71.9 kg), SpO2 98%.  LABORATORY DATA: Lab Results  Component Value Date   WBC 6.8 01/19/2023   HGB 12.3 01/19/2023   HCT 36.7 01/19/2023   MCV 78.6 (L) 01/19/2023   PLT 233 01/19/2023      Chemistry      Component Value Date/Time   NA 141 01/19/2023 1024   NA 144 08/30/2020 1550   K 3.6 01/19/2023 1024   CL 106 01/19/2023 1024   CO2 28 01/19/2023 1024   BUN 13 01/19/2023 1024   BUN 10 08/30/2020 1550   CREATININE 0.76  01/19/2023 1024   CREATININE 0.75 05/15/2015 1306      Component Value Date/Time   CALCIUM 9.5 01/19/2023 1024   ALKPHOS 65 01/19/2023 1024   AST 22 01/19/2023 1024   ALT 19 01/19/2023 1024   BILITOT 0.4 01/19/2023 1024       RADIOGRAPHIC STUDIES: CT Chest W Contrast  Result Date: 01/20/2023 CLINICAL DATA:  77 year old female with history of non-small cell lung cancer. Staging examination. * Tracking Code: BO * EXAM: CT CHEST WITH CONTRAST TECHNIQUE: Multidetector CT imaging of the chest was performed during intravenous contrast administration. RADIATION DOSE REDUCTION: This exam was performed according to the departmental dose-optimization program which includes automated exposure control, adjustment of the mA and/or kV according to patient size and/or use of iterative reconstruction technique. CONTRAST:  75mL OMNIPAQUE IOHEXOL 300 MG/ML  SOLN COMPARISON:  Chest CT 07/22/2022. FINDINGS: Cardiovascular: Heart size is normal. There is no  significant pericardial fluid, thickening or pericardial calcification. There is aortic atherosclerosis, as well as atherosclerosis of the great vessels of the mediastinum and the coronary arteries, including calcified atherosclerotic plaque in the left anterior descending and right coronary arteries. Mediastinum/Nodes: No pathologically enlarged mediastinal or hilar lymph nodes. Prominent 8 mm prevascular lymph node, similar to the prior study (nonspecific). Esophagus is unremarkable in appearance. No axillary lymphadenopathy. Lungs/Pleura: Status post left lower lobectomy. Compensatory hyperexpansion of the left upper lobe. No definite suspicious appearing pulmonary nodules or masses are noted. No acute consolidative airspace disease. No pleural effusions. Mild diffuse bronchial wall thickening with very mild centrilobular and paraseptal emphysema. Upper Abdomen: Diffuse low attenuation throughout the visualized hepatic parenchyma, suggesting a background of hepatic steatosis. Aortic atherosclerosis. 3 mm nonobstructive calculus in the upper pole collecting system of the left kidney. Musculoskeletal: There are no aggressive appearing lytic or blastic lesions noted in the visualized portions of the skeleton. IMPRESSION: 1. Status post left lower lobectomy with no findings to suggest recurrent or definite metastatic disease in the thorax. 2. Aortic atherosclerosis, in addition to 2 vessel coronary artery disease. Assessment for potential risk factor modification, dietary therapy or pharmacologic therapy may be warranted, if clinically indicated. 3. Mild diffuse bronchial wall thickening with very mild centrilobular and paraseptal emphysema; imaging findings suggestive of underlying COPD. 4. Hepatic steatosis. 5. 3 mm nonobstructive calculus in the upper pole collecting system of the left kidney. Aortic Atherosclerosis (ICD10-I70.0) and Emphysema (ICD10-J43.9). Electronically Signed   By: Trudie Reed M.D.   On:  01/20/2023 10:54     ASSESSMENT AND PLAN: This is a very pleasant 77 years old white female diagnosed with: 1) Stage IA (T1c, N0, M0) non-small cell lung cancer, adenocarcinoma diagnosed in December 2022 Status post left lower lobectomy with lymph node dissection under the care of Dr. Dorris Fetch on May 30, 2021. The patient is currently on observation and she is feeling fine. She had repeat CT scan of the chest performed recently.  I personally and independently reviewed the scan and discussed the results with the patient and her husband. Her scan showed no concerning findings for disease recurrence or metastasis. I recommended for her to continue on observation with repeat CT scan of the chest in 1 year. She was advised to call immediately if she has any other concerning symptoms in the interval. The patient voices understanding of current disease status and treatment options and is in agreement with the current care plan.  All questions were answered. The patient knows to call the clinic with any problems, questions or concerns. We  can certainly see the patient much sooner if necessary.  The total time spent in the appointment was 20 minutes.  Disclaimer: This note was dictated with voice recognition software. Similar sounding words can inadvertently be transcribed and may not be corrected upon review.

## 2023-01-22 DIAGNOSIS — M5032 Other cervical disc degeneration, mid-cervical region, unspecified level: Secondary | ICD-10-CM | POA: Diagnosis not present

## 2023-01-22 DIAGNOSIS — Z885 Allergy status to narcotic agent status: Secondary | ICD-10-CM | POA: Diagnosis not present

## 2023-01-22 DIAGNOSIS — M5030 Other cervical disc degeneration, unspecified cervical region: Secondary | ICD-10-CM | POA: Diagnosis not present

## 2023-01-22 DIAGNOSIS — M5136 Other intervertebral disc degeneration, lumbar region: Secondary | ICD-10-CM | POA: Diagnosis not present

## 2023-01-22 DIAGNOSIS — M47812 Spondylosis without myelopathy or radiculopathy, cervical region: Secondary | ICD-10-CM | POA: Diagnosis not present

## 2023-01-22 DIAGNOSIS — M4802 Spinal stenosis, cervical region: Secondary | ICD-10-CM | POA: Diagnosis not present

## 2023-01-23 DIAGNOSIS — M5030 Other cervical disc degeneration, unspecified cervical region: Secondary | ICD-10-CM | POA: Diagnosis not present

## 2023-01-26 DIAGNOSIS — M5136 Other intervertebral disc degeneration, lumbar region: Secondary | ICD-10-CM | POA: Diagnosis not present

## 2023-02-05 DIAGNOSIS — M4722 Other spondylosis with radiculopathy, cervical region: Secondary | ICD-10-CM | POA: Diagnosis not present

## 2023-02-05 DIAGNOSIS — G894 Chronic pain syndrome: Secondary | ICD-10-CM | POA: Diagnosis not present

## 2023-02-05 DIAGNOSIS — M5030 Other cervical disc degeneration, unspecified cervical region: Secondary | ICD-10-CM | POA: Diagnosis not present

## 2023-02-05 DIAGNOSIS — M47812 Spondylosis without myelopathy or radiculopathy, cervical region: Secondary | ICD-10-CM | POA: Diagnosis not present

## 2023-02-05 DIAGNOSIS — M4802 Spinal stenosis, cervical region: Secondary | ICD-10-CM | POA: Diagnosis not present

## 2023-02-15 DIAGNOSIS — Z6829 Body mass index (BMI) 29.0-29.9, adult: Secondary | ICD-10-CM | POA: Diagnosis not present

## 2023-02-15 DIAGNOSIS — M810 Age-related osteoporosis without current pathological fracture: Secondary | ICD-10-CM | POA: Diagnosis not present

## 2023-02-15 DIAGNOSIS — Z01419 Encounter for gynecological examination (general) (routine) without abnormal findings: Secondary | ICD-10-CM | POA: Diagnosis not present

## 2023-02-15 DIAGNOSIS — R32 Unspecified urinary incontinence: Secondary | ICD-10-CM | POA: Diagnosis not present

## 2023-02-15 DIAGNOSIS — K5900 Constipation, unspecified: Secondary | ICD-10-CM | POA: Diagnosis not present

## 2023-02-16 ENCOUNTER — Other Ambulatory Visit: Payer: Self-pay | Admitting: Family Medicine

## 2023-02-16 DIAGNOSIS — Z1231 Encounter for screening mammogram for malignant neoplasm of breast: Secondary | ICD-10-CM

## 2023-02-26 ENCOUNTER — Ambulatory Visit: Payer: Medicare Other

## 2023-03-04 ENCOUNTER — Ambulatory Visit
Admission: RE | Admit: 2023-03-04 | Discharge: 2023-03-04 | Disposition: A | Discharge status: Home / Self Care | Payer: Medicare Other | Source: Ambulatory Visit | Attending: Family Medicine | Admitting: Family Medicine

## 2023-03-04 DIAGNOSIS — Z1231 Encounter for screening mammogram for malignant neoplasm of breast: Secondary | ICD-10-CM | POA: Diagnosis not present

## 2023-03-08 DIAGNOSIS — M9906 Segmental and somatic dysfunction of lower extremity: Secondary | ICD-10-CM | POA: Diagnosis not present

## 2023-03-08 DIAGNOSIS — M1711 Unilateral primary osteoarthritis, right knee: Secondary | ICD-10-CM | POA: Diagnosis not present

## 2023-03-08 DIAGNOSIS — M9903 Segmental and somatic dysfunction of lumbar region: Secondary | ICD-10-CM | POA: Diagnosis not present

## 2023-03-08 DIAGNOSIS — M5137 Other intervertebral disc degeneration, lumbosacral region: Secondary | ICD-10-CM | POA: Diagnosis not present

## 2023-03-11 ENCOUNTER — Telehealth: Payer: Self-pay | Admitting: Cardiovascular Disease

## 2023-03-11 NOTE — Telephone Encounter (Signed)
Patient wants a call back to confirm if afib is one of her diagnoses.

## 2023-03-11 NOTE — Telephone Encounter (Signed)
Call to patient.  Does not ring but beeps as if VM.  LM after beep to call the office

## 2023-03-12 NOTE — Telephone Encounter (Signed)
Spoke with pt and pt was recently seen in Chiropractor office discussing tx plan Per pt plan was designed and then was told could not  have said procedure due to having afib Reviewed pt's chart and do not see where pt has been dx with afib Pt will double check with Chiropractor to make sure had right information for said pt ./cy

## 2023-03-15 DIAGNOSIS — M5137 Other intervertebral disc degeneration, lumbosacral region: Secondary | ICD-10-CM | POA: Diagnosis not present

## 2023-03-15 DIAGNOSIS — M9906 Segmental and somatic dysfunction of lower extremity: Secondary | ICD-10-CM | POA: Diagnosis not present

## 2023-03-15 DIAGNOSIS — M9903 Segmental and somatic dysfunction of lumbar region: Secondary | ICD-10-CM | POA: Diagnosis not present

## 2023-03-15 DIAGNOSIS — M1711 Unilateral primary osteoarthritis, right knee: Secondary | ICD-10-CM | POA: Diagnosis not present

## 2023-03-16 DIAGNOSIS — M9906 Segmental and somatic dysfunction of lower extremity: Secondary | ICD-10-CM | POA: Diagnosis not present

## 2023-03-16 DIAGNOSIS — R059 Cough, unspecified: Secondary | ICD-10-CM | POA: Diagnosis not present

## 2023-03-16 DIAGNOSIS — M5137 Other intervertebral disc degeneration, lumbosacral region: Secondary | ICD-10-CM | POA: Diagnosis not present

## 2023-03-16 DIAGNOSIS — M1711 Unilateral primary osteoarthritis, right knee: Secondary | ICD-10-CM | POA: Diagnosis not present

## 2023-03-16 DIAGNOSIS — M9903 Segmental and somatic dysfunction of lumbar region: Secondary | ICD-10-CM | POA: Diagnosis not present

## 2023-03-17 ENCOUNTER — Encounter (HOSPITAL_BASED_OUTPATIENT_CLINIC_OR_DEPARTMENT_OTHER): Payer: Self-pay

## 2023-03-17 ENCOUNTER — Other Ambulatory Visit: Payer: Self-pay

## 2023-03-17 ENCOUNTER — Emergency Department (HOSPITAL_BASED_OUTPATIENT_CLINIC_OR_DEPARTMENT_OTHER)
Admission: EM | Admit: 2023-03-17 | Discharge: 2023-03-17 | Disposition: A | Discharge status: Home / Self Care | Payer: Medicare Other | Attending: Emergency Medicine | Admitting: Emergency Medicine

## 2023-03-17 ENCOUNTER — Emergency Department (HOSPITAL_BASED_OUTPATIENT_CLINIC_OR_DEPARTMENT_OTHER): Payer: Medicare Other

## 2023-03-17 DIAGNOSIS — J90 Pleural effusion, not elsewhere classified: Secondary | ICD-10-CM | POA: Diagnosis not present

## 2023-03-17 DIAGNOSIS — I10 Essential (primary) hypertension: Secondary | ICD-10-CM | POA: Insufficient documentation

## 2023-03-17 DIAGNOSIS — Z79899 Other long term (current) drug therapy: Secondary | ICD-10-CM | POA: Insufficient documentation

## 2023-03-17 DIAGNOSIS — R059 Cough, unspecified: Secondary | ICD-10-CM | POA: Diagnosis not present

## 2023-03-17 DIAGNOSIS — R918 Other nonspecific abnormal finding of lung field: Secondary | ICD-10-CM | POA: Diagnosis not present

## 2023-03-17 DIAGNOSIS — J209 Acute bronchitis, unspecified: Secondary | ICD-10-CM

## 2023-03-17 DIAGNOSIS — Z20822 Contact with and (suspected) exposure to covid-19: Secondary | ICD-10-CM | POA: Diagnosis not present

## 2023-03-17 DIAGNOSIS — Z85118 Personal history of other malignant neoplasm of bronchus and lung: Secondary | ICD-10-CM | POA: Diagnosis not present

## 2023-03-17 DIAGNOSIS — I700 Atherosclerosis of aorta: Secondary | ICD-10-CM | POA: Diagnosis not present

## 2023-03-17 LAB — GROUP A STREP BY PCR: Group A Strep by PCR: NOT DETECTED

## 2023-03-17 LAB — SARS CORONAVIRUS 2 BY RT PCR: SARS Coronavirus 2 by RT PCR: NEGATIVE

## 2023-03-17 MED ORDER — AZITHROMYCIN 250 MG PO TABS
500.0000 mg | ORAL_TABLET | Freq: Once | ORAL | Status: AC
  Administered 2023-03-17: 500 mg via ORAL
  Filled 2023-03-17: qty 2

## 2023-03-17 MED ORDER — AZITHROMYCIN 250 MG PO TABS: 250.0000 mg | ORAL_TABLET | Freq: Every day | ORAL | 0 refills | Status: DC

## 2023-03-17 MED ORDER — BENZONATATE 100 MG PO CAPS
100.0000 mg | ORAL_CAPSULE | Freq: Once | ORAL | Status: AC
  Administered 2023-03-17: 100 mg via ORAL
  Filled 2023-03-17: qty 1

## 2023-03-17 MED ORDER — BENZONATATE 100 MG PO CAPS: 100.0000 mg | ORAL_CAPSULE | Freq: Three times a day (TID) | ORAL | 0 refills | Status: DC

## 2023-03-17 NOTE — ED Triage Notes (Signed)
Pt states that she has had a sore throat and productive clear cough since Sun

## 2023-03-17 NOTE — ED Provider Notes (Signed)
[place] EMERGENCY DEPARTMENT AT [facility] Provider Note   CSN: [id number] Arrival date & time: 03/17/23  0359     History  Chief Complaint  Patient presents with   Sore Throat    Laura [name] is a 77 y.o. female.  Patient is a 77 year old female with history of cardiomyopathy, left bundle branch block, hypertension, obstructive sleep apnea, GERD, and lung cancer with lobectomy in 2022.  Patient presenting today with complaints of cough.  This has been worsening over the past 4 days.  She was seen at urgent care yesterday and was told she did not have COVID, then was discharged.  She has been coughing throughout the evening and not getting any sleep.  Cough is productive of a white sputum.  No fevers or chills.  She denies ill contacts.  No aggravating or alleviating factors.  The history is provided by the patient.       Home Medications Prior to Admission medications   Medication Sig Start Date End Date Taking? Authorizing Provider  atorvastatin (LIPITOR) 80 MG tablet TAKE 1/2 TABLET BY MOUTH DAILY FOR CHOLESTEROL Patient taking differently: Take 40 mg by mouth daily. TAKE 1/2 TABLET BY MOUTH DAILY FOR CHOLESTEROL 04/24/15   Croitoru, Mihai, MD  diazepam (VALIUM) 5 MG tablet Take 1 tablet (5 mg total) by mouth every 12 (twelve) hours as needed for muscle spasms. Patient taking differently: Take 10 mg by mouth every 12 (twelve) hours as needed for muscle spasms. Take 10 mg by mouth 05/12/20   Jacalyn Lefevre, MD  hydrALAZINE (APRESOLINE) 50 MG tablet Take 1 tablet (50 mg total) by mouth 2 (two) times daily. 04/09/22   Lennette Bihari, MD  irbesartan (AVAPRO) 300 MG tablet Take 300 mg by mouth daily.    [provider]  levothyroxine (SYNTHROID, LEVOTHROID) 88 MCG tablet Take 88 mcg by mouth daily before breakfast.    [provider]  Melatonin 10 MG TABS Take 5 mg by mouth at bedtime as needed.    [provider]  metoprolol succinate  (TOPROL-XL) 50 MG 24 hr tablet TAKE ONE AND ONE-HALF TABLETS BY MOUTH DAILY 12/31/20   Lennette Bihari, MD  NUVIGIL 250 MG tablet Take 250 mg by mouth daily as needed (energy boost). 06/14/15   [provider]      Allergies    Codeine, Ciprofloxacin, Ace inhibitors, Citalopram, Fetzima [levomilnacipran], Lasix [furosemide], Nsaids, Sulfamethoxazole-trimethoprim, and Xanax xr [alprazolam er]    Review of Systems   Review of Systems  All other systems reviewed and are negative.   Physical Exam Updated Vital Signs BP (!) 133/91   Pulse 74   Temp 98.5 F (36.9 C) (Oral)   Resp 20   SpO2 99%  Physical Exam Vitals and nursing note reviewed.  Constitutional:      General: She is not in acute distress.    Appearance: She is well-developed. She is not diaphoretic.  HENT:     Head: Normocephalic and atraumatic.  Cardiovascular:     Rate and Rhythm: Normal rate and regular rhythm.     Heart sounds: No murmur heard.    No friction rub. No gallop.  Pulmonary:     Effort: Pulmonary effort is normal. No respiratory distress.     Breath sounds: Normal breath sounds. No wheezing.  Abdominal:     General: Bowel sounds are normal. There is no distension.     Palpations: Abdomen is soft.     Tenderness: There is no  abdominal tenderness.  Musculoskeletal:        General: Normal range of motion.     Cervical back: Normal range of motion and neck supple.  Skin:    General: Skin is warm and dry.  Neurological:     General: No focal deficit present.     Mental Status: She is alert and oriented to person, place, and time.     ED Results / Procedures / Treatments   Labs (all labs ordered are listed, but only abnormal results are displayed) Labs Reviewed  SARS CORONAVIRUS 2 BY RT PCR  GROUP A STREP BY PCR    EKG None  Radiology No results found.  Procedures Procedures    Medications Ordered in ED Medications - No data to display  ED Course/ Medical Decision Making/  A&P  Patient is a 77 year old female with past medical history as per HPI presenting with cough worsening over the past several days.  Her cough worsened tonight to the point that she is unable to sleep and keeping herself and her husband awake because of it.  Patient arrives here afebrile with stable vital signs.  Physical examination basically unremarkable.  COVID and strep test obtained, both of which are negative.  Chest x-ray shows opacity at the left base which may reflect atelectasis and/or consolidation with superimposed small left pleural effusion.  Patient does have a history of lobectomy secondary to lung cancer and perhaps these findings are scarring.  However due to the persistent cough, I will treat as though it may be an infiltrate.  Patient to be given Zithromax and Tessalon.  She is to follow-up as needed.  Final Clinical Impression(s) / ED Diagnoses Final diagnoses:  None    Rx / DC Orders ED Discharge Orders     None         Geoffery Lyons, MD 03/17/23 [phone]

## 2023-03-17 NOTE — Discharge Instructions (Signed)
Begin taking Zithromax as prescribed.  Begin taking Tessalon as prescribed as needed for cough.  Follow-up with primary doctor if not improving in the next few days, and return to the ER if symptoms significantly worsen or change.

## 2023-03-24 DIAGNOSIS — Z86711 Personal history of pulmonary embolism: Secondary | ICD-10-CM | POA: Diagnosis not present

## 2023-03-24 DIAGNOSIS — F332 Major depressive disorder, recurrent severe without psychotic features: Secondary | ICD-10-CM | POA: Diagnosis not present

## 2023-03-24 DIAGNOSIS — G8929 Other chronic pain: Secondary | ICD-10-CM | POA: Diagnosis not present

## 2023-03-24 DIAGNOSIS — I10 Essential (primary) hypertension: Secondary | ICD-10-CM | POA: Diagnosis not present

## 2023-03-24 DIAGNOSIS — E7800 Pure hypercholesterolemia, unspecified: Secondary | ICD-10-CM | POA: Diagnosis not present

## 2023-03-24 DIAGNOSIS — R102 Pelvic and perineal pain: Secondary | ICD-10-CM | POA: Diagnosis not present

## 2023-03-24 DIAGNOSIS — I255 Ischemic cardiomyopathy: Secondary | ICD-10-CM | POA: Diagnosis not present

## 2023-03-24 DIAGNOSIS — R7309 Other abnormal glucose: Secondary | ICD-10-CM | POA: Diagnosis not present

## 2023-03-24 DIAGNOSIS — J40 Bronchitis, not specified as acute or chronic: Secondary | ICD-10-CM | POA: Diagnosis not present

## 2023-03-24 DIAGNOSIS — E039 Hypothyroidism, unspecified: Secondary | ICD-10-CM | POA: Diagnosis not present

## 2023-03-24 DIAGNOSIS — F411 Generalized anxiety disorder: Secondary | ICD-10-CM | POA: Diagnosis not present

## 2023-03-24 DIAGNOSIS — Z85118 Personal history of other malignant neoplasm of bronchus and lung: Secondary | ICD-10-CM | POA: Diagnosis not present

## 2023-03-29 DIAGNOSIS — M545 Low back pain, unspecified: Secondary | ICD-10-CM | POA: Diagnosis not present

## 2023-03-29 DIAGNOSIS — M9903 Segmental and somatic dysfunction of lumbar region: Secondary | ICD-10-CM | POA: Diagnosis not present

## 2023-03-29 DIAGNOSIS — M9906 Segmental and somatic dysfunction of lower extremity: Secondary | ICD-10-CM | POA: Diagnosis not present

## 2023-03-29 DIAGNOSIS — M1711 Unilateral primary osteoarthritis, right knee: Secondary | ICD-10-CM | POA: Diagnosis not present

## 2023-03-30 ENCOUNTER — Encounter: Payer: Self-pay | Admitting: Obstetrics and Gynecology

## 2023-03-30 ENCOUNTER — Ambulatory Visit (INDEPENDENT_AMBULATORY_CARE_PROVIDER_SITE_OTHER): Payer: Medicare Other | Admitting: Obstetrics and Gynecology

## 2023-03-30 VITALS — BP 164/96 | HR 116 | Ht 61.42 in | Wt 154.0 lb

## 2023-03-30 DIAGNOSIS — K5904 Chronic idiopathic constipation: Secondary | ICD-10-CM

## 2023-03-30 DIAGNOSIS — R140 Abdominal distension (gaseous): Secondary | ICD-10-CM

## 2023-03-30 DIAGNOSIS — N393 Stress incontinence (female) (male): Secondary | ICD-10-CM | POA: Diagnosis not present

## 2023-03-30 DIAGNOSIS — N8110 Cystocele, unspecified: Secondary | ICD-10-CM

## 2023-03-30 DIAGNOSIS — R350 Frequency of micturition: Secondary | ICD-10-CM | POA: Diagnosis not present

## 2023-03-30 DIAGNOSIS — N816 Rectocele: Secondary | ICD-10-CM | POA: Diagnosis not present

## 2023-03-30 LAB — POCT URINALYSIS DIPSTICK
Bilirubin, UA: NEGATIVE
Blood, UA: NEGATIVE
Glucose, UA: NEGATIVE
Ketones, UA: NEGATIVE
Nitrite, UA: NEGATIVE
Protein, UA: NEGATIVE
Spec Grav, UA: 1.01 (ref 1.010–1.025)
Urobilinogen, UA: 0.2 U/dL
pH, UA: 6.5 (ref 5.0–8.0)

## 2023-03-30 NOTE — Progress Notes (Signed)
New Patient Evaluation and Consultation  Referring Provider: Ranae Pila, * PCP: Noberto Retort, MD Date of Service: 03/30/2023  SUBJECTIVE Chief Complaint: New Patient (Initial Visit) Laura [name] is a 77 y.o. female here for a consult for prolapse./)  History of Present Illness: CAROLLYN ETCHEVERRY is a 77 y.o. White or Caucasian female seen in consultation at the request of Dr. Elon Spanner for evaluation of prolapse.    Review of records significant for: Has a history of sling with mesh. Currently using a pessary due to prolapse recurrence.   Urinary Symptoms: Leaks urine with cough/ sneeze, laughing, lifting, going from sitting to standing, with movement to the bathroom, and with urgency Increasing incontinence since the pessary was placed. Leaks 1 time(s) per day.  Pad use: 6 pads per day.   Patient is bothered by UI symptoms. History of Lynx suburethral sling in 2014  Day time voids- depends, sometimes has leakage with urge, sometimes can hold a few hours.  Nocturia: 2 times per night to void. Voiding dysfunction:  does not empty bladder well.  Patient does not use a catheter to empty bladder.  When urinating, patient feels the need to urinate multiple times in a row and to push on her belly or vagina to empty bladder   UTIs: 2 UTI's in the last year.   Denies history of blood in urine and kidney or bladder stones No results found for the last 90 days.   Pelvic Organ Prolapse Symptoms:                  Patient Denies a feeling of a bulge the vaginal area.  Has tried two pessaries, last was placed about 4 months. Before she had the pessary, she did not feel pressure and could not feel anything at the opening of the vagina.  She does not feel like the pessary has helped with her defecation.  She tried pelvic PT but had back pain and could not continue.   Bowel Symptom: Bowel movements: 3 time(s) per week- needs enema for each BM Stool consistency: hard- Bristol 1,  always small balls Straining: yes.  Splinting: yes.  Incomplete evacuation: yes.  Patient Denies accidental bowel leakage / fecal incontinence Bowel regimen: enemas- has tried laxatives (miralax), metamucil with no improvement Has noticed increasing bloating for about 2 years, has not had any abdominal imaging.   HM Colonoscopy          Discontinued - Colonoscopy  Discontinued      Frequency changed to Never automatically (Topic No Longer Applies)   08/03/2019  Outside Claim: PR COLSC FLX W/RMVL OF TUMOR POLYP LESION SNARE TQ   Only the first 1 history entries have been loaded, but more history exists.          Due for next colonoscopy in 2025.   Sexual Function Sexually active: no.    Pelvic Pain Denies pelvic pain   Past Medical History:  Past Medical History:  Diagnosis Date   Anemia    Anxiety    Asthma    related to sesonal allergies   Cancer (HCC)    Lung   Chronic combined systolic and diastolic CHF, NYHA class 2 (HCC) CARDIOLOGIST-  DR [id number]   Coronary artery disease    Depression    History of kidney stones    History of non-ST elevation myocardial infarction (NSTEMI) 11/27/2009   SECONDARY TO TAKOTSUDO SYNDROME (CARDIAC CATH NORMAL)   Hyperlipemia    Hypertension  Hypoglycemia    Hypothyroidism    LBBB (left bundle branch block)    Left ventricular ejection fraction less than 40%    38% PER CARDIOLOGIST NOTE (DR CROITORU)   Memory loss    Mood swings    Myocardial infarction [facility]) 2011   Nonischemic dilated cardiomyopathy (HCC)    MODERATELY DEPRESSED LVF;EF 35-45% by Echo 05/27/11   OSA (obstructive sleep apnea) MODERATE PER STUDY 2005   CPAP NONCOMPLIANT   Pre-diabetes    Pulmonary embolism (HCC)    after the lobectomy   Seasonal allergies    SUI (stress urinary incontinence, female)      Past Surgical History:   Past Surgical History:  Procedure Laterality Date   CARDIAC CATHETERIZATION  09-04-1999;  08/25/2004;   12/09/2009  DR  Royann Shivers   NORMAL CORONARIES/  APICAL BALLOONING OF LV CONSISTENT WITH TAKOTSUBO SYMPTOMS/ EF 30-35%   CATARACT EXTRACTION W/ INTRAOCULAR LENS  IMPLANT, BILATERAL     CHOLECYSTECTOMY N/A 09/29/2012   Procedure: LAPAROSCOPIC CHOLECYSTECTOMY WITH INTRAOPERATIVE CHOLANGIOGRAM;  Surgeon: Ardeth Sportsman, MD;  Location: [facility] OR;  Service: General;  Laterality: N/A;   COLONOSCOPY  07/2020   CYSTOSCOPY N/A 09/19/2012   Procedure: Derinda Late;  Surgeon: Valetta Fuller, MD;  Location: [facility];  Service: Urology;  Laterality: N/A;   DILATION AND CURETTAGE OF UTERUS     EYE SURGERY Bilateral    cataract removal   INTERCOSTAL NERVE BLOCK  05/30/2021   Procedure: INTERCOSTAL NERVE BLOCK;  Surgeon: Loreli Slot, MD;  Location: [facility] OR;  Service: Thoracic;;   KNEE ARTHROSCOPY W/ MENISCECTOMY  07/27/2011   MEDIAL AND LATERAL   LOBECTOMY  05/30/2021   Procedure: LEFT LOWER LOBECTOMY;  Surgeon: Loreli Slot, MD;  Location: [facility] OR;  Service: Thoracic;;   LYMPH NODE DISSECTION  05/30/2021   Procedure: LYMPH NODE DISSECTION;  Surgeon: Loreli Slot, MD;  Location: [facility] OR;  Service: Thoracic;;   NASAL SEPTUM SURGERY  02/28/1979   PUBOVAGINAL SLING N/A 09/19/2012   Procedure: SUBURETHRAL Elio Forget;  Surgeon: Valetta Fuller, MD;  Location: [facility];  Service: Urology;  Laterality: N/A;   RIGHT URETEROSCOPIC STONE EXTRACTION  08/31/2000   TRANSTHORACIC ECHOCARDIOGRAM  05-27-2011  DR CROITORU   MODERATELY DEPRESSED LVF DUE TO GLOBAL HYPOKINESIS AND MARKED SYSTOLIC ASYNCHRONY/ EF 38%/ MILD LEFT ATRIAL DILATATION   VAGINAL HYSTERECTOMY  06/30/1983   partial     Past OB/GYN History: [id number] OB History  Gravida Para Term Preterm AB Living  4 2 2   2 2   SAB IAB Ectopic Multiple Live Births  2       2    # Outcome Date GA Lbr Len/2nd Weight Sex Type Anes PTL Lv  4 Term      Vag-Spont     3 Term      Vag-Spont     2 SAB           1 SAB             S/p hysterectomy    Medications: Patient has a current medication list which includes the following prescription(s): hydralazine, irbesartan, levothyroxine, metoprolol succinate, nuvigil, atorvastatin, diazepam, and melatonin.   Allergies: Patient is allergic to codeine, ciprofloxacin, ace inhibitors, citalopram, fetzima [levomilnacipran], lasix [furosemide], nsaids, sulfamethoxazole-trimethoprim, and xanax xr [alprazolam er].   Social History:  Social History   Tobacco Use   Smoking status: Never   Smokeless tobacco: Never  Vaping Use   Vaping  status: Never Used  Substance Use Topics   Alcohol use: No    Alcohol/week: 0.0 standard drinks of alcohol   Drug use: No    Relationship status: long-term partner Patient lives with husband.   Patient is not employed. Regular exercise: No History of abuse: No  Family History:   Family History  Problem Relation Age of Onset   Pneumonia Mother    Hypertension Mother    Heart attack Father    Heart attack Paternal Grandfather    Fibromyalgia Brother    Pulmonary embolism Brother    Hypertension Brother    Heart disease Brother    Breast cancer Neg Hx      Review of Systems: Review of Systems  Constitutional:  Positive for malaise/fatigue and weight loss. Negative for fever.  Respiratory:  Positive for shortness of breath. Negative for cough and wheezing.   Cardiovascular:  Negative for chest pain, palpitations and leg swelling.  Gastrointestinal:  Negative for abdominal pain and blood in stool.  Genitourinary:  Negative for dysuria.  Musculoskeletal:  Positive for myalgias.  Skin:  Negative for rash.  Neurological:  Negative for dizziness and headaches.  Endo/Heme/Allergies:  Bruises/bleeds easily.  Psychiatric/Behavioral:  Positive for depression. The patient is nervous/anxious.      OBJECTIVE Physical Exam: Vitals:   03/30/23 0830  BP: (!) 164/96  Pulse: (!) 116  Weight: 154 lb (69.9 kg)  Height: 5' 1.42"  (1.56 m)    Physical Exam Constitutional:      General: She is not in acute distress. Pulmonary:     Effort: Pulmonary effort is normal.  Abdominal:     General: There is distension.     Palpations: Abdomen is soft.     Tenderness: There is no abdominal tenderness. There is no rebound.  Musculoskeletal:        General: No swelling. Normal range of motion.  Skin:    General: Skin is warm and dry.     Findings: No rash.  Neurological:     Mental Status: She is alert and oriented to person, place, and time.  Psychiatric:        Mood and Affect: Mood normal.        Behavior: Behavior normal.      GU / Detailed Urogynecologic Evaluation:  Pelvic Exam: Normal external female genitalia; Bartholin's and Skene's glands normal in appearance; urethral meatus normal in appearance, no urethral masses or discharge.   CST: negative Pessary removed and cleaned. s/p hysterectomy: Speculum exam reveals normal vaginal mucosa with  atrophy and normal vaginal cuff.  Adnexa no mass, fullness, tenderness.  Pessary replaced.   Pelvic floor strength I/V, puborectalis III/V external anal sphincter III/V  Pelvic floor musculature: Right levator non-tender, Right obturator non-tender, Left levator non-tender, Left obturator non-tender  POP-Q:   POP-Q  -0.5                                            Aa   -0.5                                           Ba  -7.5  C   3.5                                            Gh  4                                            Pb  8.5                                            tvl   0                                            Ap  0                                            Bp                                                 D      Rectal Exam:  Normal sphincter tone, moderate distal rectocele, enterocoele not present, no rectal masses, no sign of dyssynergia when asking the patient to bear  down.  Post-Void Residual (PVR) by Bladder Scan: In order to evaluate bladder emptying, we discussed obtaining a postvoid residual and patient agreed to this procedure.  Procedure: The ultrasound unit was placed on the patient's abdomen in the suprapubic region after the patient had voided.    Post Void Residual - 03/30/23 0844       Post Void Residual   Post Void Residual 5 mL              Laboratory Results: Lab Results  Component Value Date   COLORU Yellow 03/30/2023   CLARITYU Clear 03/30/2023   GLUCOSEUR Negative 03/30/2023   BILIRUBINUR Negative 03/30/2023   KETONESU Negative 03/30/2023   SPECGRAV 1.010 03/30/2023   RBCUR Negative 03/30/2023   PHUR 6.5 03/30/2023   PROTEINUR Negative 03/30/2023   UROBILINOGEN 0.2 03/30/2023   LEUKOCYTESUR Trace (A) 03/30/2023    Lab Results  Component Value Date   CREATININE 0.76 01/19/2023   CREATININE 0.88 07/22/2022   CREATININE 0.93 01/13/2022    Lab Results  Component Value Date   HGBA1C 6.0 (H) 12/19/2018    Lab Results  Component Value Date   HGB 12.3 01/19/2023     ASSESSMENT AND PLAN Ms. Alderman is a 77 y.o. with:  1. Prolapse of anterior vaginal wall   2. Prolapse of posterior vaginal wall   3. Urinary frequency   4. Chronic idiopathic constipation   5. Abdominal bloating   6. SUI (stress urinary incontinence, female)    Stage II anterior, Stage II posterior, Stage I apical prolapse - For treatment of pelvic organ prolapse, we discussed options for management including expectant management, conservative management, and surgical management,  such as Kegels, a pessary, pelvic floor physical therapy, and specific surgical procedures. - We discussed possible anterior and posterior repair but it is unclear if she would benefit as her pessary current reduces her prolapse well yet she is still having a lot of issues with constipation. We discussed that her stool consistency will not change with the surgery. She  is not really interested in surgery.  - Will keep the pessary until her visit with GI then reassess need for it since she is not sure if it is providing any benefit.   2. Constipation.  - GI referral for constipation for Bristol 1 type stools refractory to laxatives and fiber  - Has significant abdominal bloating that has worsened over time. Will perform CT abdomen and pelvis to evaluate  3. SUI - History of Lynx sling - overall not her most bothersome symptom, did not discuss in detail today but can readdress at future visit.   Return 3 months   Marguerita Beards, MD

## 2023-03-31 DIAGNOSIS — M9906 Segmental and somatic dysfunction of lower extremity: Secondary | ICD-10-CM | POA: Diagnosis not present

## 2023-03-31 DIAGNOSIS — M1711 Unilateral primary osteoarthritis, right knee: Secondary | ICD-10-CM | POA: Diagnosis not present

## 2023-03-31 DIAGNOSIS — M9903 Segmental and somatic dysfunction of lumbar region: Secondary | ICD-10-CM | POA: Diagnosis not present

## 2023-03-31 DIAGNOSIS — M545 Low back pain, unspecified: Secondary | ICD-10-CM | POA: Diagnosis not present

## 2023-04-05 DIAGNOSIS — M545 Low back pain, unspecified: Secondary | ICD-10-CM | POA: Diagnosis not present

## 2023-04-05 DIAGNOSIS — M9906 Segmental and somatic dysfunction of lower extremity: Secondary | ICD-10-CM | POA: Diagnosis not present

## 2023-04-05 DIAGNOSIS — M9903 Segmental and somatic dysfunction of lumbar region: Secondary | ICD-10-CM | POA: Diagnosis not present

## 2023-04-05 DIAGNOSIS — M1711 Unilateral primary osteoarthritis, right knee: Secondary | ICD-10-CM | POA: Diagnosis not present

## 2023-04-08 DIAGNOSIS — M9906 Segmental and somatic dysfunction of lower extremity: Secondary | ICD-10-CM | POA: Diagnosis not present

## 2023-04-08 DIAGNOSIS — M1711 Unilateral primary osteoarthritis, right knee: Secondary | ICD-10-CM | POA: Diagnosis not present

## 2023-04-08 DIAGNOSIS — M545 Low back pain, unspecified: Secondary | ICD-10-CM | POA: Diagnosis not present

## 2023-04-08 DIAGNOSIS — M9903 Segmental and somatic dysfunction of lumbar region: Secondary | ICD-10-CM | POA: Diagnosis not present

## 2023-04-13 DIAGNOSIS — M545 Low back pain, unspecified: Secondary | ICD-10-CM | POA: Diagnosis not present

## 2023-04-13 DIAGNOSIS — M9903 Segmental and somatic dysfunction of lumbar region: Secondary | ICD-10-CM | POA: Diagnosis not present

## 2023-04-13 DIAGNOSIS — M1711 Unilateral primary osteoarthritis, right knee: Secondary | ICD-10-CM | POA: Diagnosis not present

## 2023-04-13 DIAGNOSIS — M9906 Segmental and somatic dysfunction of lower extremity: Secondary | ICD-10-CM | POA: Diagnosis not present

## 2023-04-14 ENCOUNTER — Telehealth: Payer: Self-pay | Admitting: Internal Medicine

## 2023-04-14 NOTE — Telephone Encounter (Signed)
Transfer of care is not required was last seen in 2021. Called pateint to schedule for constipation. Left voicemail.

## 2023-04-14 NOTE — Telephone Encounter (Signed)
Good Afternoon Dr. Leonides Schanz    Received a call from patient wanting to do a transfer of care from [facility] to you for constipation. She is not satisfied with the care she received there. Records are in epic, will you please review and advise on scheduling.   Thank you.

## 2023-04-14 NOTE — Telephone Encounter (Signed)
Reviewed her prior GI notes. Okay to schedule for OV.  "Although Laura [name] has opioids in her profile she reports she rarely uses them. Her story today revolves mostly around her back pain issues, but her episodic abdominal pain and bloating is likely the result of a combination of pelvic floor dysfunction and potentially a component of neurogenic bowel given all these back issues and injections she has had. I make note that she has no structural issues that I can see based on prior colonoscopy and imaging. In addition, she reports to me that almost every laxative regimen (Lactulose, Miralax, stiumulants, Amitixa, Linzess, Trulance) have failed her. I feel she needs to see Pelvic health for further evaluation and recommendations here, as I suspect a strong component of pelvic floor dysfunction."

## 2023-04-19 DIAGNOSIS — M545 Low back pain, unspecified: Secondary | ICD-10-CM | POA: Diagnosis not present

## 2023-04-19 DIAGNOSIS — M9906 Segmental and somatic dysfunction of lower extremity: Secondary | ICD-10-CM | POA: Diagnosis not present

## 2023-04-19 DIAGNOSIS — M9903 Segmental and somatic dysfunction of lumbar region: Secondary | ICD-10-CM | POA: Diagnosis not present

## 2023-04-19 DIAGNOSIS — M1711 Unilateral primary osteoarthritis, right knee: Secondary | ICD-10-CM | POA: Diagnosis not present

## 2023-04-21 DIAGNOSIS — M1711 Unilateral primary osteoarthritis, right knee: Secondary | ICD-10-CM | POA: Diagnosis not present

## 2023-04-21 DIAGNOSIS — M9903 Segmental and somatic dysfunction of lumbar region: Secondary | ICD-10-CM | POA: Diagnosis not present

## 2023-04-21 DIAGNOSIS — M9906 Segmental and somatic dysfunction of lower extremity: Secondary | ICD-10-CM | POA: Diagnosis not present

## 2023-04-21 DIAGNOSIS — M545 Low back pain, unspecified: Secondary | ICD-10-CM | POA: Diagnosis not present

## 2023-04-22 ENCOUNTER — Encounter: Payer: Self-pay | Admitting: Internal Medicine

## 2023-04-22 ENCOUNTER — Ambulatory Visit (INDEPENDENT_AMBULATORY_CARE_PROVIDER_SITE_OTHER): Payer: Medicare Other | Admitting: Internal Medicine

## 2023-04-22 VITALS — BP 122/70 | HR 84 | Ht 63.0 in | Wt 154.0 lb

## 2023-04-22 DIAGNOSIS — K5900 Constipation, unspecified: Secondary | ICD-10-CM

## 2023-04-22 DIAGNOSIS — Z9049 Acquired absence of other specified parts of digestive tract: Secondary | ICD-10-CM

## 2023-04-22 DIAGNOSIS — R110 Nausea: Secondary | ICD-10-CM

## 2023-04-22 DIAGNOSIS — R140 Abdominal distension (gaseous): Secondary | ICD-10-CM

## 2023-04-22 DIAGNOSIS — R159 Full incontinence of feces: Secondary | ICD-10-CM

## 2023-04-22 MED ORDER — PLENVU 140 G PO SOLR: 1.0000 | Freq: Once | ORAL | 0 refills | Status: AC

## 2023-04-22 MED ORDER — MOTEGRITY 2 MG PO TABS: 2.0000 mg | ORAL_TABLET | Freq: Every day | ORAL | 0 refills | Status: DC

## 2023-04-22 NOTE — Patient Instructions (Addendum)
You have been scheduled for a colonoscopy. Please follow written instructions given to you at your visit today.   Please pick up your prep supplies at the pharmacy within the next 1-3 days.  If you use inhalers (even only as needed), please bring them with you on the day of your procedure.  DO NOT TAKE 7 DAYS PRIOR TO TEST- Trulicity (dulaglutide) Ozempic, Wegovy (semaglutide) Mounjaro (tirzepatide) Bydureon Bcise (exanatide extended release)  DO NOT TAKE 1 DAY PRIOR TO YOUR TEST Rybelsus (semaglutide) Adlyxin (lixisenatide) Victoza (liraglutide) Byetta (exanatide) ___________________________________________________________________________  We will request your colonoscopy records from Dr. Jennye Boroughs office.  You will be contacted by [facility] Scheduling in the next 2 days to arrange a CT Abdomen and Pelvis.  The number on your caller ID will be [phone], please answer when they call.  If you have not heard from them in 2 days please call [phone] to schedule.     We have sent the following medications to your pharmacy for you to pick up at your convenience: Motegrity 2 mg: Take once daily  You have been given a testing kit to check for small intestine bacterial overgrowth (SIBO) which is completed by a company named [facility]. Make sure to return your test in the mail using the return mailing label given to you along with the kit. The test order, your demographic and insurance information have all already been sent to the company. [facility] will collect an upfront charge of $99.74 for commercial insurance plans and $209.74 if you are paying cash. Make sure to discuss with [facility] PRIOR to having the test to see if they have gotten information from your insurance company as to how much your testing will cost out of pocket, if any. Please contact [facility] at phone number [phone] to get instructions regarding how to perform the test as our  office is unable to give specific testing instructions.   I encourage you to start water aerobics.  Thank you for entrusting me with your care and for choosing [facility], Dr. Eulah Pont

## 2023-04-22 NOTE — Progress Notes (Signed)
Chief Complaint: Constipation, bloating  HPI : 77 year old female with history of anxiety, lung adenocarcinoma s/p LLL lobectomy in 05/2021, HFpEF (EF 45-50%), COPD, CAD, hypothyroidism, OSA, PE, and HTN presents with constipation and bloating.  Since 2020, she has had issues with constipation and bloating. She has chronic back pain issues. She was diagnosed with PE when she was being worked up for lung cancer. She took Eliquis for a period of time and stopped the Eliquis in 06/2022. Her lung cancer was treated with a LLL lobectomy. She was having trouble with constipation when she was getting treated for lung cancer. She was referred to pelvic floor physical therapist and she completed 5 sessions. She was also given a pessary. Currently she has to use an enema daily to have BMs regularly. She has abdominal discomfort and feels sick to her stomach. She has always had nausea ever since she had her gallbladder removed. She used to be on opioids but is off of all of them now. Denies rectal bleeding. She is on average having one BM every day. She does feel gas and bloating in the morning. She feels like she has to go to the bathroom but cannot do so. She is drinking plenty of water. She is not very physically active due to her arthritis. She has tried drinking prune juice and tries to eat plenty of fruit to get enough fiber. Endorses nausea. Denies vomiting. Her abdominal discomfort is mostly in the lower abdomen. She is family history of IBS. Last EGD and colonoscopy were in 2021 with Dr. Kinnie Scales.  From Dr. Yevonne Pax last note 03/04/22: "Although Ronica has opioids in her profile she reports she rarely uses them. Her story today revolves mostly around her back pain issues, but her episodic abdominal pain and bloating is likely the result of a combination of pelvic floor dysfunction and potentially a component of neurogenic bowel given all these back issues and injections she has had. I make note that she has no  structural issues that I can see based on prior colonoscopy and imaging. In addition, she reports to me that almost every laxative regimen (Lactulose, Miralax, stiumulants, Amitixa, Linzess, Trulance, IBSRela) have failed her. I feel she needs to see Pelvic health for further evaluation and recommendations here, as I suspect a strong component of pelvic floor dysfunction."  Wt Readings from Last 3 Encounters:  04/22/23 154 lb (69.9 kg)  03/30/23 154 lb (69.9 kg)  01/20/23 158 lb 8 oz (71.9 kg)   Past Medical History:  Diagnosis Date   Anemia    Anxiety    Asthma    related to sesonal allergies   Cancer (HCC)    Lung   Chronic combined systolic and diastolic CHF, NYHA class 2 (HCC) CARDIOLOGIST-  DR [id number]   Coronary artery disease    Depression    History of colon polyps 07/2019   tubular adenoma - Dr. Kinnie Scales   History of kidney stones    History of non-ST elevation myocardial infarction (NSTEMI) 11/27/2009   SECONDARY TO TAKOTSUDO SYNDROME (CARDIAC CATH NORMAL)   Hyperlipemia    Hypertension    Hypoglycemia    Hypothyroidism    LBBB (left bundle branch block)    Left ventricular ejection fraction less than 40%    38% PER CARDIOLOGIST NOTE (DR CROITORU)   Memory loss    Mood swings    Myocardial infarction (HCC) 2011   Nonischemic dilated cardiomyopathy (HCC)    MODERATELY DEPRESSED LVF;EF 35-45% by Echo  05/27/11   OSA (obstructive sleep apnea) MODERATE PER STUDY 2005   CPAP NONCOMPLIANT   Pre-diabetes    Pulmonary embolism (HCC)    after the lobectomy   Seasonal allergies    SUI (stress urinary incontinence, female)      Past Surgical History:  Procedure Laterality Date   CARDIAC CATHETERIZATION  09-04-1999;  08/25/2004;   12/09/2009  DR Royann Shivers   NORMAL CORONARIES/  APICAL BALLOONING OF LV CONSISTENT WITH TAKOTSUBO SYMPTOMS/ EF 30-35%   CATARACT EXTRACTION W/ INTRAOCULAR LENS  IMPLANT, BILATERAL     CHOLECYSTECTOMY N/A 09/29/2012   Procedure: LAPAROSCOPIC  CHOLECYSTECTOMY WITH INTRAOPERATIVE CHOLANGIOGRAM;  Surgeon: Ardeth Sportsman, MD;  Location: [facility] OR;  Service: General;  Laterality: N/A;   COLONOSCOPY  07/2020   CYSTOSCOPY N/A 09/19/2012   Procedure: Derinda Late;  Surgeon: Valetta Fuller, MD;  Location: [facility];  Service: Urology;  Laterality: N/A;   DILATION AND CURETTAGE OF UTERUS     EYE SURGERY Bilateral    cataract removal   INTERCOSTAL NERVE BLOCK  05/30/2021   Procedure: INTERCOSTAL NERVE BLOCK;  Surgeon: Loreli Slot, MD;  Location[facility] OR;  Service: Thoracic;;   KNEE ARTHROSCOPY W/ MENISCECTOMY  07/27/2011   MEDIAL AND LATERAL   LOBECTOMY  05/30/2021   Procedure: LEFT LOWER LOBECTOMY;  Surgeon: Loreli Slot, MD;  Location: [facility] OR;  Service: Thoracic;;   LYMPH NODE DISSECTION  05/30/2021   Procedure: LYMPH NODE DISSECTION;  Surgeon: Loreli Slot, MD;  Location: [facility] OR;  Service: Thoracic;;   NASAL SEPTUM SURGERY  02/28/1979   PUBOVAGINAL SLING N/A 09/19/2012   Procedure: SUBURETHRAL Elio Forget;  Surgeon: Valetta Fuller, MD;  Location: [facility];  Service: Urology;  Laterality: N/A;   RIGHT URETEROSCOPIC STONE EXTRACTION  08/31/2000   TRANSTHORACIC ECHOCARDIOGRAM  05-27-2011  DR CROITORU   MODERATELY DEPRESSED LVF DUE TO GLOBAL HYPOKINESIS AND MARKED SYSTOLIC ASYNCHRONY/ EF 38%/ MILD LEFT ATRIAL DILATATION   VAGINAL HYSTERECTOMY  06/30/1983   partial   Family History  Problem Relation Age of Onset   Pneumonia Mother    Hypertension Mother    Heart attack Father    Heart attack Paternal Grandfather    Fibromyalgia Brother    Pulmonary embolism Brother    Hypertension Brother    Heart disease Brother    Breast cancer Neg Hx    Social History   Tobacco Use   Smoking status: Never   Smokeless tobacco: Never  Vaping Use   Vaping status: Never Used  Substance Use Topics   Alcohol use: No    Alcohol/week: 0.0 standard drinks of alcohol   Drug use: No    Current Outpatient Medications  Medication Sig Dispense Refill   diazepam (VALIUM) 5 MG tablet Take 1 tablet (5 mg total) by mouth every 12 (twelve) hours as needed for muscle spasms. 10 tablet 0   hydrALAZINE (APRESOLINE) 50 MG tablet Take 1 tablet (50 mg total) by mouth 2 (two) times daily. (Patient taking differently: Take 50 mg by mouth 2 (two) times daily. Pt is taking 75 mg) 180 tablet 3   irbesartan (AVAPRO) 300 MG tablet Take 300 mg by mouth daily.     levothyroxine (SYNTHROID, LEVOTHROID) 88 MCG tablet Take 88 mcg by mouth daily before breakfast.     Melatonin 10 MG TABS Take 5 mg by mouth at bedtime as needed. Pt taking 5 mg a day     metoprolol succinate (TOPROL-XL) 50 MG  24 hr tablet TAKE ONE AND ONE-HALF TABLETS BY MOUTH DAILY 90 tablet 3   NUVIGIL 250 MG tablet Take 250 mg by mouth daily as needed (energy boost).  3   atorvastatin (LIPITOR) 80 MG tablet TAKE 1/2 TABLET BY MOUTH DAILY FOR CHOLESTEROL (Patient not taking: Reported on 04/22/2023) 45 tablet 3   No current facility-administered medications for this visit.   Allergies  Allergen Reactions   Codeine Anaphylaxis, Hives and Other (See Comments)    Headache. Daughter reported that it caused her throat to swell up    Ciprofloxacin Nausea And Vomiting   Ace Inhibitors Other (See Comments)    Unknown- it "didn't agree with her"    Citalopram Other (See Comments)    Fatigue   Fetzima [Levomilnacipran] Other (See Comments)    "Talking out of my head"   Lasix [Furosemide] Other (See Comments)    HEADACHE   Nsaids Other (See Comments)    Told not to take NSAIDs because of her heart    Sulfamethoxazole-Trimethoprim Other (See Comments)    NERVOUS AND DISORENTED   Xanax Xr [Alprazolam Er] Other (See Comments)    confusion   Review of Systems: All systems reviewed and negative except where noted in HPI.   Physical Exam: BP 122/70   Pulse 84   Ht 5\' 3"  (1.6 m)   Wt 154 lb (69.9 kg)   BMI 27.28 kg/m   Constitutional: Pleasant,well-developed, female in no acute distress. HEENT: Normocephalic and atraumatic. Conjunctivae are normal. No scleral icterus. Cardiovascular: Normal rate, regular rhythm.  Pulmonary/chest: Effort normal and breath sounds normal. No wheezing, rales or rhonchi. Abdominal: Soft, nondistended, nontender. Bowel sounds active throughout. There are no masses palpable. No hepatomegaly. Extremities: No edema Neurological: Alert and oriented to person place and time. Skin: Skin is warm and dry. No rashes noted. Psychiatric: Normal mood and affect. Behavior is normal.  Labs 01/2022: TSH nml.   Labs 02/2022: CBC and CMP unremarkable.   Labs 06/2022: CBC and CMP unremarkable  Labs 12/2022: CBC and CMP unremarkable.  KUB 12/11/21: IMPRESSION: Bowel-gas pattern is unremarkable. No radiographic evidence of constipation. Moderate thoracolumbar spondylosis. No acute cardiopulmonary disease.  CT A/P w/contrast 03/06/22: IMPRESSION:  1. No acute findings   Colonoscopy 08/04/19: No procedure report available Path: Tubular adenoma  EGD 08/04/19: No procedure report available Path: Polypoid fragments of antral type gastric mucosa with reactive gastropathy and chronic active gastritis with focal intestinal metaplasia. H pylori was negative  ASSESSMENT AND PLAN: Constipation Fecal incontinence, suspected overflow incontinence Bloating Nausea Lower abdominal discomfort History of cholecystectomy Patient presents with longstanding constipation for years that has been resistant to multiple treatments in the past.  She has undergone pelvic floor physical therapy without significant improvement in her symptoms.  She has been tried on a multitude of constipation therapies in the past including Lactulose, Miralax, stimulant laxatives, Amitiza, Linzess, Trulance, and IBSRela.  He was previously on opioid therapies but has weaned herself off of these currently.  Patient feels significantly  bloated and endorses fecal incontinence, nausea, and lower abdominal discomfort, which may all be due to her constipation issues.  She on average has 1 bowel movement per day by using an enema.  Will plan for further evaluation of her symptoms with a CT A/P with contrast, SIBO breath test, and colonoscopy with 2-day prep.  In the meantime we will start the patient on Motegrity to see if this will help with her constipation issues.  - Declined antiemetics because they have  not worked for her in the past - Geophysical data processor starting water aerobics  - SIBO breath test - Start Motegrity 2 mg every day - Continue enemas - Check CT A/P w/contrast - Colonoscopy [facility] with 2-day prep - Can consider another referral to pelvic floor PT in the future or MRI pelvis in the future - Will try to get prior EGD and colonoscopy reports from Dr. Radene Gunning, MD  I spent 65 minutes of time, including in depth chart review, independent review of results as outlined above, communicating results with the patient directly, face-to-face time with the patient, coordinating care, ordering studies and medications as appropriate, and documentation.

## 2023-04-23 ENCOUNTER — Telehealth: Payer: Self-pay | Admitting: Internal Medicine

## 2023-04-23 NOTE — Telephone Encounter (Signed)
Inbound call from patient stating she is scheduled for a colonoscopy with Dr. Leonides Schanz on 11/4 and is requesting to speak with Jan regarding the procedure. Please advise.

## 2023-04-23 NOTE — Telephone Encounter (Signed)
Laura [name] needed to r/s her colon. Her husband is having dental surgery on 05/03/2023. She is now on for 06/03/2023 and I will mail new instructions to her. Confirmed her address and told her to call us back if she doesn't receive them or has questions.

## 2023-04-26 DIAGNOSIS — M1711 Unilateral primary osteoarthritis, right knee: Secondary | ICD-10-CM | POA: Diagnosis not present

## 2023-04-26 DIAGNOSIS — M9906 Segmental and somatic dysfunction of lower extremity: Secondary | ICD-10-CM | POA: Diagnosis not present

## 2023-04-26 DIAGNOSIS — M545 Low back pain, unspecified: Secondary | ICD-10-CM | POA: Diagnosis not present

## 2023-04-26 DIAGNOSIS — M9903 Segmental and somatic dysfunction of lumbar region: Secondary | ICD-10-CM | POA: Diagnosis not present

## 2023-04-28 ENCOUNTER — Telehealth: Payer: Self-pay | Admitting: Internal Medicine

## 2023-04-28 ENCOUNTER — Telehealth: Payer: Self-pay

## 2023-04-28 NOTE — Telephone Encounter (Signed)
Pharmacy indicated that Plenvu was too expensive. Called pharmacy and gave them the Medicare coupon codes which the patient must not have provided to the pharmacy.  The pharmacist indicated, with the codes, it would be $60. They will order the Plenvu and notify patient when she can pick it.

## 2023-04-28 NOTE — Telephone Encounter (Signed)
Inbound call from patient stating that she would not be able to get Motegrity because it was too expensive. Patient is requesting a call to discuss if there is an alternative medication she can be put on. Please advise.

## 2023-04-29 NOTE — Telephone Encounter (Signed)
Called patient and left detailed message re: start  ttaking Linzess 290 mcg.  If she still has any 145 mcg capsules she can take 2 togehter to = 290 mcg.  Otherwise we have samples of 290 mcg capsules we can provide. She can pick up on 2nd floor of our office, asked her to call back to confirm.

## 2023-04-29 NOTE — Telephone Encounter (Signed)
Patient called to find out where the Motegrity medication stands. Please advise

## 2023-04-29 NOTE — Telephone Encounter (Signed)
Called [facility] again. Spoke to [facility]. Motegrity would be over $540/month

## 2023-04-29 NOTE — Telephone Encounter (Signed)
Called [facility]. LM to request cost of thirty day supply of Motegrity

## 2023-04-30 NOTE — Telephone Encounter (Signed)
Per Burna Mortimer she can't take Linzess. Her PCP gave this to her in the past and it caused great nausea for her. I put it on her allergy list and will route to Dr Leonides Schanz to advise.

## 2023-04-30 NOTE — Telephone Encounter (Signed)
Inbound call from patient in regards to previous note.please advise.

## 2023-04-30 NOTE — Telephone Encounter (Signed)
Zyionna said the pharmacy ordered the plenvu and it should come next week and she will go and get it.

## 2023-05-01 ENCOUNTER — Other Ambulatory Visit: Payer: Self-pay

## 2023-05-01 MED ORDER — POLYETHYLENE GLYCOL 3350 17 G PO PACK: 17.0000 g | PACK | Freq: Three times a day (TID) | ORAL | Status: AC

## 2023-05-01 NOTE — Progress Notes (Signed)
Med list updated. Miralax TID

## 2023-05-03 ENCOUNTER — Encounter: Payer: Medicare Other | Admitting: Internal Medicine

## 2023-05-03 DIAGNOSIS — M1711 Unilateral primary osteoarthritis, right knee: Secondary | ICD-10-CM | POA: Diagnosis not present

## 2023-05-03 DIAGNOSIS — M9906 Segmental and somatic dysfunction of lower extremity: Secondary | ICD-10-CM | POA: Diagnosis not present

## 2023-05-03 DIAGNOSIS — M9903 Segmental and somatic dysfunction of lumbar region: Secondary | ICD-10-CM | POA: Diagnosis not present

## 2023-05-03 DIAGNOSIS — M545 Low back pain, unspecified: Secondary | ICD-10-CM | POA: Diagnosis not present

## 2023-05-03 NOTE — Telephone Encounter (Signed)
I left her a detailed message with the plan on her mobile #.

## 2023-05-04 ENCOUNTER — Telehealth: Payer: Self-pay | Admitting: Internal Medicine

## 2023-05-04 NOTE — Telephone Encounter (Signed)
Patient is concerned about the Plenvu prep. She is concerned about the safety with her other medications.  The patient will be on a 2 day Plenvu and Miralax prep. We discussed how the PEG prep solutions work. Importance of hydration with fluids , broths, clear juices.  Do you agree with the prep? The patient also asks if she needs to remove her pessary?

## 2023-05-04 NOTE — Telephone Encounter (Signed)
PT is calling to discuss prep medication. She advised that she's a heart patient and that she needs to let us know what health issues she has. She also feels the ingredients are harmful for her body. Please advise.

## 2023-05-05 DIAGNOSIS — M9903 Segmental and somatic dysfunction of lumbar region: Secondary | ICD-10-CM | POA: Diagnosis not present

## 2023-05-05 DIAGNOSIS — M9906 Segmental and somatic dysfunction of lower extremity: Secondary | ICD-10-CM | POA: Diagnosis not present

## 2023-05-05 DIAGNOSIS — M1711 Unilateral primary osteoarthritis, right knee: Secondary | ICD-10-CM | POA: Diagnosis not present

## 2023-05-05 DIAGNOSIS — M545 Low back pain, unspecified: Secondary | ICD-10-CM | POA: Diagnosis not present

## 2023-05-05 NOTE — Telephone Encounter (Signed)
Patient advised per Dr Leonides Schanz the prep of Plenvu and Miralax is fine. She does not need to remove the pessary.

## 2023-05-11 DIAGNOSIS — M545 Low back pain, unspecified: Secondary | ICD-10-CM | POA: Diagnosis not present

## 2023-05-11 DIAGNOSIS — M1711 Unilateral primary osteoarthritis, right knee: Secondary | ICD-10-CM | POA: Diagnosis not present

## 2023-05-11 DIAGNOSIS — M9906 Segmental and somatic dysfunction of lower extremity: Secondary | ICD-10-CM | POA: Diagnosis not present

## 2023-05-11 DIAGNOSIS — M9903 Segmental and somatic dysfunction of lumbar region: Secondary | ICD-10-CM | POA: Diagnosis not present

## 2023-05-13 ENCOUNTER — Ambulatory Visit (HOSPITAL_COMMUNITY)
Admission: RE | Admit: 2023-05-13 | Discharge: 2023-05-13 | Disposition: A | Discharge status: Home / Self Care | Payer: Medicare Other | Source: Ambulatory Visit | Attending: Internal Medicine | Admitting: Internal Medicine

## 2023-05-13 DIAGNOSIS — R140 Abdominal distension (gaseous): Secondary | ICD-10-CM | POA: Diagnosis not present

## 2023-05-13 DIAGNOSIS — K5730 Diverticulosis of large intestine without perforation or abscess without bleeding: Secondary | ICD-10-CM | POA: Diagnosis not present

## 2023-05-13 DIAGNOSIS — M1711 Unilateral primary osteoarthritis, right knee: Secondary | ICD-10-CM | POA: Diagnosis not present

## 2023-05-13 DIAGNOSIS — R110 Nausea: Secondary | ICD-10-CM | POA: Diagnosis not present

## 2023-05-13 DIAGNOSIS — Z9049 Acquired absence of other specified parts of digestive tract: Secondary | ICD-10-CM | POA: Insufficient documentation

## 2023-05-13 DIAGNOSIS — K5900 Constipation, unspecified: Secondary | ICD-10-CM | POA: Diagnosis not present

## 2023-05-13 DIAGNOSIS — M9903 Segmental and somatic dysfunction of lumbar region: Secondary | ICD-10-CM | POA: Diagnosis not present

## 2023-05-13 DIAGNOSIS — M9906 Segmental and somatic dysfunction of lower extremity: Secondary | ICD-10-CM | POA: Diagnosis not present

## 2023-05-13 DIAGNOSIS — R112 Nausea with vomiting, unspecified: Secondary | ICD-10-CM | POA: Diagnosis not present

## 2023-05-13 DIAGNOSIS — R159 Full incontinence of feces: Secondary | ICD-10-CM | POA: Diagnosis not present

## 2023-05-13 DIAGNOSIS — M545 Low back pain, unspecified: Secondary | ICD-10-CM | POA: Diagnosis not present

## 2023-05-13 MED ORDER — IOHEXOL 300 MG/ML  SOLN
100.0000 mL | Freq: Once | INTRAMUSCULAR | Status: AC | PRN
  Administered 2023-05-13: 100 mL via INTRAVENOUS

## 2023-05-18 DIAGNOSIS — M545 Low back pain, unspecified: Secondary | ICD-10-CM | POA: Diagnosis not present

## 2023-05-18 DIAGNOSIS — M1711 Unilateral primary osteoarthritis, right knee: Secondary | ICD-10-CM | POA: Diagnosis not present

## 2023-05-18 DIAGNOSIS — M9903 Segmental and somatic dysfunction of lumbar region: Secondary | ICD-10-CM | POA: Diagnosis not present

## 2023-05-18 DIAGNOSIS — M9906 Segmental and somatic dysfunction of lower extremity: Secondary | ICD-10-CM | POA: Diagnosis not present

## 2023-05-22 DIAGNOSIS — R140 Abdominal distension (gaseous): Secondary | ICD-10-CM | POA: Diagnosis not present

## 2023-05-22 DIAGNOSIS — R110 Nausea: Secondary | ICD-10-CM | POA: Diagnosis not present

## 2023-05-25 DIAGNOSIS — M9906 Segmental and somatic dysfunction of lower extremity: Secondary | ICD-10-CM | POA: Diagnosis not present

## 2023-05-25 DIAGNOSIS — M1711 Unilateral primary osteoarthritis, right knee: Secondary | ICD-10-CM | POA: Diagnosis not present

## 2023-05-25 DIAGNOSIS — M545 Low back pain, unspecified: Secondary | ICD-10-CM | POA: Diagnosis not present

## 2023-05-25 DIAGNOSIS — M9903 Segmental and somatic dysfunction of lumbar region: Secondary | ICD-10-CM | POA: Diagnosis not present

## 2023-05-31 DIAGNOSIS — M1711 Unilateral primary osteoarthritis, right knee: Secondary | ICD-10-CM | POA: Diagnosis not present

## 2023-05-31 DIAGNOSIS — M9906 Segmental and somatic dysfunction of lower extremity: Secondary | ICD-10-CM | POA: Diagnosis not present

## 2023-05-31 DIAGNOSIS — M9903 Segmental and somatic dysfunction of lumbar region: Secondary | ICD-10-CM | POA: Diagnosis not present

## 2023-05-31 DIAGNOSIS — M545 Low back pain, unspecified: Secondary | ICD-10-CM | POA: Diagnosis not present

## 2023-06-03 ENCOUNTER — Ambulatory Visit: Payer: Medicare Other | Admitting: Internal Medicine

## 2023-06-03 ENCOUNTER — Encounter: Payer: Self-pay | Admitting: Internal Medicine

## 2023-06-03 VITALS — BP 131/74 | HR 63 | Temp 98.0°F | Resp 14 | Ht 63.0 in | Wt 154.0 lb

## 2023-06-03 DIAGNOSIS — Z860100 Personal history of colon polyps, unspecified: Secondary | ICD-10-CM

## 2023-06-03 DIAGNOSIS — K5730 Diverticulosis of large intestine without perforation or abscess without bleeding: Secondary | ICD-10-CM

## 2023-06-03 DIAGNOSIS — D123 Benign neoplasm of transverse colon: Secondary | ICD-10-CM

## 2023-06-03 DIAGNOSIS — F32A Depression, unspecified: Secondary | ICD-10-CM | POA: Diagnosis not present

## 2023-06-03 DIAGNOSIS — R159 Full incontinence of feces: Secondary | ICD-10-CM | POA: Diagnosis not present

## 2023-06-03 DIAGNOSIS — K648 Other hemorrhoids: Secondary | ICD-10-CM

## 2023-06-03 DIAGNOSIS — I10 Essential (primary) hypertension: Secondary | ICD-10-CM | POA: Diagnosis not present

## 2023-06-03 DIAGNOSIS — D122 Benign neoplasm of ascending colon: Secondary | ICD-10-CM

## 2023-06-03 DIAGNOSIS — I2510 Atherosclerotic heart disease of native coronary artery without angina pectoris: Secondary | ICD-10-CM | POA: Diagnosis not present

## 2023-06-03 DIAGNOSIS — G4733 Obstructive sleep apnea (adult) (pediatric): Secondary | ICD-10-CM | POA: Diagnosis not present

## 2023-06-03 DIAGNOSIS — Z1211 Encounter for screening for malignant neoplasm of colon: Secondary | ICD-10-CM | POA: Diagnosis not present

## 2023-06-03 DIAGNOSIS — K5900 Constipation, unspecified: Secondary | ICD-10-CM | POA: Diagnosis not present

## 2023-06-03 DIAGNOSIS — F419 Anxiety disorder, unspecified: Secondary | ICD-10-CM | POA: Diagnosis not present

## 2023-06-03 DIAGNOSIS — E039 Hypothyroidism, unspecified: Secondary | ICD-10-CM | POA: Diagnosis not present

## 2023-06-03 MED ORDER — SODIUM CHLORIDE 0.9 % IV SOLN: 500.0000 mL | Freq: Once | INTRAVENOUS | Status: DC

## 2023-06-03 NOTE — Patient Instructions (Addendum)
-   Patient given educational handouts related to procedure. - Await pathology results - Discharge patient to home (with escort)    YOU HAD AN ENDOSCOPIC PROCEDURE TODAY AT THE [facility]:   Refer to the procedure report that was given to you for any specific questions about what was found during the examination.  If the procedure report does not answer your questions, please call your gastroenterologist to clarify.  If you requested that your care partner not be given the details of your procedure findings, then the procedure report has been included in a sealed envelope for you to review at your convenience later.  YOU SHOULD EXPECT: Some feelings of bloating in the abdomen. Passage of more gas than usual.  Walking can help get rid of the air that was put into your GI tract during the procedure and reduce the bloating. If you had a lower endoscopy (such as a colonoscopy or flexible sigmoidoscopy) you may notice spotting of blood in your stool or on the toilet paper. If you underwent a bowel prep for your procedure, you may not have a normal bowel movement for a few days.  Please Note:  You might notice some irritation and congestion in your nose or some drainage.  This is from the oxygen used during your procedure.  There is no need for concern and it should clear up in a day or so.  SYMPTOMS TO REPORT IMMEDIATELY:  Following lower endoscopy (colonoscopy or flexible sigmoidoscopy):  Excessive amounts of blood in the stool  Significant tenderness or worsening of abdominal pains  Swelling of the abdomen that is new, acute  Fever of 100F or higher  For urgent or emergent issues, a gastroenterologist can be reached at any hour by calling [phone]. Do not use MyChart messaging for urgent concerns.    DIET:  We do recommend a small meal at first, but then you may proceed to your regular diet.  Drink plenty of fluids but you should avoid alcoholic beverages for 24  hours.  ACTIVITY:  You should plan to take it easy for the rest of today and you should NOT DRIVE or use heavy machinery until tomorrow (because of the sedation medicines used during the test).    FOLLOW UP: Our staff will call the number listed on your records the next business day following your procedure.  We will call around 7:15- 8:00 am to check on you and address any questions or concerns that you may have regarding the information given to you following your procedure. If we do not reach you, we will leave a message.     If any biopsies were taken you will be contacted by phone or by letter within the next 1-3 weeks.  Please call us at [phone] if you have not heard about the biopsies in 3 weeks.    SIGNATURES/CONFIDENTIALITY: You and/or your care partner have signed paperwork which will be entered into your electronic medical record.  These signatures attest to the fact that that the information above on your After Visit Summary has been reviewed and is understood.  Full responsibility of the confidentiality of this discharge information lies with you and/or your care-partner.

## 2023-06-03 NOTE — Op Note (Addendum)
[facility] Patient Name: Laura [name] Procedure Date: 06/03/2023 9:51 AM MRN: [id number] Endoscopist: Particia Lather , , [id number] Age: 77 Referring MD:  Date of Birth: 1946-03-11 Gender: Female Account #: [id number] Procedure:                Colonoscopy Indications:              High risk colon cancer surveillance: Personal                            history of colonic polyps, Incidental -                            Constipation, Incidental - Fecal incontinence Medicines:                Monitored Anesthesia Care Procedure:                Pre-Anesthesia Assessment:                           - Prior to the procedure, a History and Physical                            was performed, and patient medications and                            allergies were reviewed. The patient's tolerance of                            previous anesthesia was also reviewed. The risks                            and benefits of the procedure and the sedation                            options and risks were discussed with the patient.                            All questions were answered, and informed consent                            was obtained. Prior Anticoagulants: The patient has                            taken no anticoagulant or antiplatelet agents. ASA                            Grade Assessment: III - A patient with severe                            systemic disease. After reviewing the risks and                            benefits, the patient was deemed in satisfactory  condition to undergo the procedure.                           After obtaining informed consent, the colonoscope                            was passed under direct vision. Throughout the                            procedure, the patient's blood pressure, pulse, and                            oxygen saturations were monitored continuously. The                            Olympus Scope SN  [id number] was introduced through the                            anus and advanced to the the terminal ileum. The                            colonoscopy was performed without difficulty. The                            patient tolerated the procedure well. The quality                            of the bowel preparation was good. The terminal                            ileum, ileocecal valve, appendiceal orifice, and                            rectum were photographed. Scope In: 10:00:25 AM Scope Out: 10:18:30 AM Scope Withdrawal Time: 0 hours 14 minutes 20 seconds  Total Procedure Duration: 0 hours 18 minutes 5 seconds  Findings:                 The terminal ileum appeared normal.                           Four sessile polyps were found in the transverse                            colon and ascending colon. The polyps were 3 to 8                            mm in size. These polyps were removed with a cold                            snare. Resection and retrieval were complete.                           Multiple diverticula were found in the sigmoid  colon and descending colon.                           Non-bleeding internal hemorrhoids were found during                            retroflexion. Complications:            No immediate complications. Estimated Blood Loss:     Estimated blood loss was minimal. Impression:               - The examined portion of the ileum was normal.                           - Four 3 to 8 mm polyps in the transverse colon and                            in the ascending colon, removed with a cold snare.                            Resected and retrieved.                           - Diverticulosis in the sigmoid colon and in the                            descending colon.                           - Non-bleeding internal hemorrhoids. Recommendation:           - Discharge patient to home (with escort).                           - Await  pathology results.                           - Return to GI clinic in 3 months.                           - Continue Miralax TID for now. Okay to try BID to                            see if this works adequately.                           - Once her back pain improves, can consider                            referral to pelvic floor PT to strengthen anal                            sphincter.                           - Consider MRI defecography in the future. Patient  has had 3 pessaries and one sling placed in the                            past.                           - The findings and recommendations were discussed                            with the patient. Dr Particia Lather "Alan Ripper" Leonides Schanz,  06/03/2023 10:25:52 AM

## 2023-06-03 NOTE — Progress Notes (Signed)
GASTROENTEROLOGY PROCEDURE H&P NOTE   Primary Care Physician: Noberto Retort, MD    Reason for Procedure:    Fecal incontinence, constipation, history of colon polyps  Plan:    Colonoscopy  Patient is appropriate for endoscopic procedure(s) in the ambulatory ([facility]) setting.  The nature of the procedure, as well as the risks, benefits, and alternatives were carefully and thoroughly reviewed with the patient. Ample time for discussion and questions allowed. The patient understood, was satisfied, and agreed to proceed.     HPI: Laura [name] is a 77 y.o. female who presents for colonoscopy for evaluation of Fecal incontinence, history of colon polyps .  Patient was most recently seen in the Gastroenterology Clinic on 04/22/23.  No interval change in medical history since that appointment. Please refer to that note for full details regarding GI history and clinical presentation.   Past Medical History:  Diagnosis Date   Anemia    Anxiety    Asthma    related to sesonal allergies   Cancer (HCC)    Lung   Chronic combined systolic and diastolic CHF, NYHA class 2 (HCC) CARDIOLOGIST-  DR [id number]   Coronary artery disease    Depression    History of colon polyps 07/2019   tubular adenoma - Dr. Kinnie Scales   History of kidney stones    History of non-ST elevation myocardial infarction (NSTEMI) 11/27/2009   SECONDARY TO TAKOTSUDO SYNDROME (CARDIAC CATH NORMAL)   Hyperlipemia    Hypertension    Hypoglycemia    Hypothyroidism    LBBB (left bundle branch block)    Left ventricular ejection fraction less than 40%    38% PER CARDIOLOGIST NOTE (DR CROITORU)   Memory loss    Mood swings    Myocardial infarction (HCC) 2011   Nonischemic dilated cardiomyopathy (HCC)    MODERATELY DEPRESSED LVF;EF 35-45% by Echo 05/27/11   OSA (obstructive sleep apnea) MODERATE PER STUDY 2005   CPAP NONCOMPLIANT   Pre-diabetes    Pulmonary embolism (HCC)    after the lobectomy   Seasonal  allergies    SUI (stress urinary incontinence, female)     Past Surgical History:  Procedure Laterality Date   CARDIAC CATHETERIZATION  09-04-1999;  08/25/2004;   12/09/2009  DR Royann Shivers   NORMAL CORONARIES/  APICAL BALLOONING OF LV CONSISTENT WITH TAKOTSUBO SYMPTOMS/ EF 30-35%   CATARACT EXTRACTION W/ INTRAOCULAR LENS  IMPLANT, BILATERAL     CHOLECYSTECTOMY N/A 09/29/2012   Procedure: LAPAROSCOPIC CHOLECYSTECTOMY WITH INTRAOPERATIVE CHOLANGIOGRAM;  Surgeon: Ardeth Sportsman, MD;  Location: [facility] OR;  Service: General;  Laterality: N/A;   COLONOSCOPY  07/2020   CYSTOSCOPY N/A 09/19/2012   Procedure: Derinda Late;  Surgeon: Valetta Fuller, MD;  Location[facility];  Service: Urology;  Laterality: N/A;   DILATION AND CURETTAGE OF UTERUS     EYE SURGERY Bilateral    cataract removal   INTERCOSTAL NERVE BLOCK  05/30/2021   Procedure: INTERCOSTAL NERVE BLOCK;  Surgeon: Loreli Slot, MD;  Location: [facility] OR;  Service: Thoracic;;   KNEE ARTHROSCOPY W/ MENISCECTOMY  07/27/2011   MEDIAL AND LATERAL   LOBECTOMY  05/30/2021   Procedure: LEFT LOWER LOBECTOMY;  Surgeon: Loreli Slot, MD;  Location: [facility] OR;  Service: Thoracic;;   LYMPH NODE DISSECTION  05/30/2021   Procedure: LYMPH NODE DISSECTION;  Surgeon: Loreli Slot, MD;  Location: [facility] OR;  Service: Thoracic;;   NASAL SEPTUM SURGERY  02/28/1979   PUBOVAGINAL SLING N/A 09/19/2012  Procedure: SUBURETHRAL Elio Forget;  Surgeon: Valetta Fuller, MD;  Location: [facility];  Service: Urology;  Laterality: N/A;   RIGHT URETEROSCOPIC STONE EXTRACTION  08/31/2000   TRANSTHORACIC ECHOCARDIOGRAM  05-27-2011  DR CROITORU   MODERATELY DEPRESSED LVF DUE TO GLOBAL HYPOKINESIS AND MARKED SYSTOLIC ASYNCHRONY/ EF 38%/ MILD LEFT ATRIAL DILATATION   VAGINAL HYSTERECTOMY  06/30/1983   partial    Prior to Admission medications   Medication Sig Start Date End Date Taking? Authorizing Provider  atorvastatin  (LIPITOR) 80 MG tablet TAKE 1/2 TABLET BY MOUTH DAILY FOR CHOLESTEROL Patient not taking: Reported on 04/22/2023 04/24/15   Croitoru, Mihai, MD  diazepam (VALIUM) 5 MG tablet Take 1 tablet (5 mg total) by mouth every 12 (twelve) hours as needed for muscle spasms. 05/12/20   Jacalyn Lefevre, MD  hydrALAZINE (APRESOLINE) 50 MG tablet Take 1 tablet (50 mg total) by mouth 2 (two) times daily. Patient taking differently: Take 50 mg by mouth 2 (two) times daily. Pt is taking 75 mg 04/09/22   Lennette Bihari, MD  irbesartan (AVAPRO) 300 MG tablet Take 300 mg by mouth daily.    [provider]  levothyroxine (SYNTHROID, LEVOTHROID) 88 MCG tablet Take 88 mcg by mouth daily before breakfast.    [provider]  Melatonin 10 MG TABS Take 5 mg by mouth at bedtime as needed. Pt taking 5 mg a day    [provider]  metoprolol succinate (TOPROL-XL) 50 MG 24 hr tablet TAKE ONE AND ONE-HALF TABLETS BY MOUTH DAILY 12/31/20   Lennette Bihari, MD  NUVIGIL 250 MG tablet Take 250 mg by mouth daily as needed (energy boost). 06/14/15   [provider]  polyethylene glycol (MIRALAX) 17 g packet Take 17 g by mouth 3 (three) times daily. 05/01/23   Imogene Burn, MD    Current Outpatient Medications  Medication Sig Dispense Refill   atorvastatin (LIPITOR) 80 MG tablet TAKE 1/2 TABLET BY MOUTH DAILY FOR CHOLESTEROL (Patient not taking: Reported on 04/22/2023) 45 tablet 3   diazepam (VALIUM) 5 MG tablet Take 1 tablet (5 mg total) by mouth every 12 (twelve) hours as needed for muscle spasms. 10 tablet 0   hydrALAZINE (APRESOLINE) 50 MG tablet Take 1 tablet (50 mg total) by mouth 2 (two) times daily. (Patient taking differently: Take 50 mg by mouth 2 (two) times daily. Pt is taking 75 mg) 180 tablet 3   irbesartan (AVAPRO) 300 MG tablet Take 300 mg by mouth daily.     levothyroxine (SYNTHROID, LEVOTHROID) 88 MCG tablet Take 88 mcg by mouth daily before breakfast.     Melatonin 10 MG TABS Take  5 mg by mouth at bedtime as needed. Pt taking 5 mg a day     metoprolol succinate (TOPROL-XL) 50 MG 24 hr tablet TAKE ONE AND ONE-HALF TABLETS BY MOUTH DAILY 90 tablet 3   NUVIGIL 250 MG tablet Take 250 mg by mouth daily as needed (energy boost).  3   polyethylene glycol (MIRALAX) 17 g packet Take 17 g by mouth 3 (three) times daily.     Current Facility-Administered Medications  Medication Dose Route Frequency Provider Last Rate Last Admin   0.9 %  sodium chloride infusion  500 mL Intravenous Once Imogene Burn, MD        Allergies as of 06/03/2023 - Review Complete 06/03/2023  Allergen Reaction Noted   Codeine Anaphylaxis, Hives, and Other (See Comments) 06/18/2011   Ciprofloxacin Nausea And Vomiting 01/28/2021  Linzess [linaclotide] Nausea Only 04/30/2023   Ace inhibitors Other (See Comments) 09/14/2012   Citalopram Other (See Comments) 06/18/2013   Fetzima [levomilnacipran] Other (See Comments) 09/16/2012   Lasix [furosemide] Other (See Comments) 09/12/2012   Nsaids Other (See Comments) 12/16/2012   Sulfamethoxazole-trimethoprim Other (See Comments) 12/31/2020   Xanax xr Gerald Leitz er] Other (See Comments) 07/28/2013    Family History  Problem Relation Age of Onset   Pneumonia Mother    Hypertension Mother    Heart attack Father    Heart attack Paternal Grandfather    Fibromyalgia Brother    Pulmonary embolism Brother    Hypertension Brother    Heart disease Brother    Breast cancer Neg Hx     Social History   Socioeconomic History   Marital status: Married    Spouse name: Richard   Number of children: 2   Years of education: 12+   Highest education level: Some college, no degree  Occupational History   Occupation: Retired   Occupation: retired  Tobacco Use   Smoking status: Never   Smokeless tobacco: Never  Vaping Use   Vaping status: Never Used  Substance and Sexual Activity   Alcohol use: No    Alcohol/week: 0.0 standard drinks of alcohol   Drug use:  No   Sexual activity: Not Currently    Birth control/protection: Post-menopausal  Other Topics Concern   Not on file  Social History Narrative   Lives at home with husband.   Right-handed.   Drinks 2-3 cups caffeine per day.   No regular exercise.   Social Determinants of Health   Financial Resource Strain: Low Risk  (09/28/2022)   Received from [facility]   Overall Financial Resource Strain (CARDIA)    Difficulty of Paying Living Expenses: Not very hard  Food Insecurity: No Food Insecurity (09/28/2022)   Received from [facility]   Hunger Vital Sign    Worried About Running Out of Food in the Last Year: Never true    Ran Out of Food in the Last Year: Never true  Transportation Needs: No Transportation Needs (09/28/2022)   Received from [facility] - Transportation    Lack of Transportation (Medical): No    Lack of Transportation (Non-Medical): No  Physical Activity: Not on file  Stress: No Stress Concern Present (02/05/2022)   Received from [facility] - Occupational Stress Questionnaire    Feeling of Stress : Not at all  Social Connections: Unknown (10/27/2021)   Received from [facility]   Social Network    Social Network: Not on file  Intimate Partner Violence: Not At Risk (01/22/2023)   Received from [facility]   HITS    Over the last 12 months how often did your partner physically hurt you?: Never    Over the last 12 months how often did your partner insult you or talk down to you?: Never    Over the last 12 months how often did your partner threaten you with physical harm?: Never    Over the last 12 months how often did your partner scream or curse at you?: Never    Physical Exam: Vital signs in last 24 hours: BP 136/75   Pulse 77   Temp 98 F (36.7 C) (Temporal)   Ht 5\' 3"  (1.6 m)   Wt 154 lb (69.9 kg)   SpO2 99%   BMI 27.28 kg/m  GEN: NAD  EYE: Sclerae  anicteric ENT: MMM CV: Non-tachycardic Pulm: No increased WOB GI: Soft NEURO:  Alert & Oriented   Eulah Pont, MD [facility]   06/03/2023 9:10 AM

## 2023-06-03 NOTE — Progress Notes (Signed)
Vss nad trans to pacu 

## 2023-06-03 NOTE — Progress Notes (Signed)
Called to room to assist during endoscopic procedure.  Patient ID and intended procedure confirmed with present staff. Received instructions for my participation in the procedure from the performing physician.  

## 2023-06-04 ENCOUNTER — Telehealth: Payer: Self-pay | Admitting: *Deleted

## 2023-06-04 NOTE — Telephone Encounter (Signed)
  Follow up Call-     06/03/2023    9:02 AM  Call back number  Post procedure Call Back phone  # [phone]  Permission to leave phone message Yes     Patient questions:  Do you have a fever, pain , or abdominal swelling? No. Pain Score  0 *  Have you tolerated food without any problems? Yes.    Have you been able to return to your normal activities? Yes.    Do you have any questions about your discharge instructions: Diet   No. Medications  No. Follow up visit  No.  Do you have questions or concerns about your Care? No.  Actions: * If pain score is 4 or above: No action needed, pain <4.

## 2023-06-07 LAB — SURGICAL PATHOLOGY

## 2023-06-08 ENCOUNTER — Encounter: Payer: Self-pay | Admitting: Internal Medicine

## 2023-06-08 DIAGNOSIS — M9903 Segmental and somatic dysfunction of lumbar region: Secondary | ICD-10-CM | POA: Diagnosis not present

## 2023-06-08 DIAGNOSIS — M9906 Segmental and somatic dysfunction of lower extremity: Secondary | ICD-10-CM | POA: Diagnosis not present

## 2023-06-08 DIAGNOSIS — M1711 Unilateral primary osteoarthritis, right knee: Secondary | ICD-10-CM | POA: Diagnosis not present

## 2023-06-08 DIAGNOSIS — M545 Low back pain, unspecified: Secondary | ICD-10-CM | POA: Diagnosis not present

## 2023-06-11 ENCOUNTER — Encounter: Payer: Self-pay | Admitting: Internal Medicine

## 2023-06-11 NOTE — Progress Notes (Signed)
Received results of the patient's SIBO breath test (collected 05/22/23), which was positive for SIBO. Increase in hydrogen was 26 ppm (nml<20) and increase in methane was 3 ppm (nml<12).   Tisha, please call the patient to let her know that she has SIBO. I would recommend that she be treated with Flagyl 250 mg TID for 10 days. Let's also get her in for a clinic follow up in 2-3 months. Thanks.

## 2023-06-14 ENCOUNTER — Telehealth: Payer: Self-pay

## 2023-06-14 ENCOUNTER — Other Ambulatory Visit: Payer: Self-pay

## 2023-06-14 DIAGNOSIS — K638219 Small intestinal bacterial overgrowth, unspecified: Secondary | ICD-10-CM

## 2023-06-14 MED ORDER — METRONIDAZOLE 250 MG PO TABS: 250.0000 mg | ORAL_TABLET | Freq: Three times a day (TID) | ORAL | 0 refills | Status: AC

## 2023-06-14 NOTE — Telephone Encounter (Signed)
Spoke to patient advised per provider her SIBO breath test came back positive. Provider recommend that she be treated with Flagyl 250 mg 3 times daily for 10 days. Scheduled patient a follow up office visit on 09/09/23.

## 2023-06-16 DIAGNOSIS — M9906 Segmental and somatic dysfunction of lower extremity: Secondary | ICD-10-CM | POA: Diagnosis not present

## 2023-06-16 DIAGNOSIS — M545 Low back pain, unspecified: Secondary | ICD-10-CM | POA: Diagnosis not present

## 2023-06-16 DIAGNOSIS — M9903 Segmental and somatic dysfunction of lumbar region: Secondary | ICD-10-CM | POA: Diagnosis not present

## 2023-06-16 DIAGNOSIS — M1711 Unilateral primary osteoarthritis, right knee: Secondary | ICD-10-CM | POA: Diagnosis not present

## 2023-06-29 DIAGNOSIS — M1711 Unilateral primary osteoarthritis, right knee: Secondary | ICD-10-CM | POA: Diagnosis not present

## 2023-06-29 DIAGNOSIS — M9903 Segmental and somatic dysfunction of lumbar region: Secondary | ICD-10-CM | POA: Diagnosis not present

## 2023-06-29 DIAGNOSIS — M9906 Segmental and somatic dysfunction of lower extremity: Secondary | ICD-10-CM | POA: Diagnosis not present

## 2023-06-29 DIAGNOSIS — M545 Low back pain, unspecified: Secondary | ICD-10-CM | POA: Diagnosis not present

## 2023-07-05 ENCOUNTER — Ambulatory Visit: Payer: Medicare Other | Attending: Cardiovascular Disease | Admitting: Cardiovascular Disease

## 2023-07-05 ENCOUNTER — Encounter: Payer: Self-pay | Admitting: Cardiovascular Disease

## 2023-07-05 DIAGNOSIS — E785 Hyperlipidemia, unspecified: Secondary | ICD-10-CM | POA: Diagnosis present

## 2023-07-05 DIAGNOSIS — I447 Left bundle-branch block, unspecified: Secondary | ICD-10-CM | POA: Diagnosis not present

## 2023-07-05 DIAGNOSIS — I2699 Other pulmonary embolism without acute cor pulmonale: Secondary | ICD-10-CM | POA: Diagnosis present

## 2023-07-05 DIAGNOSIS — R400 Somnolence: Secondary | ICD-10-CM | POA: Diagnosis not present

## 2023-07-05 DIAGNOSIS — I5181 Takotsubo syndrome: Secondary | ICD-10-CM | POA: Diagnosis not present

## 2023-07-05 DIAGNOSIS — G4733 Obstructive sleep apnea (adult) (pediatric): Secondary | ICD-10-CM | POA: Diagnosis present

## 2023-07-05 DIAGNOSIS — C3432 Malignant neoplasm of lower lobe, left bronchus or lung: Secondary | ICD-10-CM | POA: Diagnosis present

## 2023-07-05 NOTE — Progress Notes (Signed)
 Cardiology Office Note    Date:  07/05/2023   ID:  CELEST REITZ, DOB 1946/01/26, MRN [id number]  PCP:  [name], MD  Cardiologist:  Debby Sor, MD   15 month F/U evaluation  History of Present Illness:  KORTNEE BAS is a 78 y.o. female who presents for a 15 month follow-up evaluation.    Ms. Adaley Kiene is a remote patient of Dr. Francyne.  Apparently, she has a history of left bundle branch block, and apparently was felt to have a Takotsubo cardiomyopathy after she presented with a non-ST segment delegation MI with a peak troponin of 5.33.  She underwent cardiac catheterization by Dr. Francyne on December 09, 2009 and was found to have normal coronary arteries and classic findings of Takotsubo cardiomyopathy with apical ballooning of the left ventricle.  Her EF by echo was 30 to 35%.  At that time she was started on lisinopril, Lasix and low-dose metoprolol .  She also has a history of asthma and bronchitis.  Subsequently, she was followed by Dr. Jacques Somerset for cardiology care.  She has a history of hypertension and has been on irbesartan  300 mg, hydralazine  50 mg twice a day, metoprolol  succinate 12.5 mg daily.  She has a history of hypothyroidism and has been on levothyroxine  88 mcg.  There also was a history of some anxiety for which she had been given diazepam  as well as citalopram .    I initially saw her in March 2020. Upon further questioning, the patient has a history of severe sleep apnea and states that she is had 3 sleep studies with her last being many years ago by Dr. Neysa.  She reports an AHI initially of 66.  She had been on CPAP therapy remotely remembers a pressure of 14 cm but then became not tolerant to CPAP mask.  As result she later obtained an oral appliance by Dr. Oneil Forget.  However she has had difficulty with some teeth fractures and and has not used her oral appliance in well over a year.  She admits to snoring.  Her sleep is nonrestorative.  She was  evaluated by Dr. Ozell America for cough bronchitis/asthma.  She was started on Dulera for asthma.  She was also recommended to take both pantoprazole  and famotidine  for GERD and its effect on her cough.    When I initially saw her in March 2020, I spent considerable time with her discussing her untreated sleep apnea and the significant improvements in mask and CPAP technology since her initial trial.  I recommended she undergo a follow-up echo Doppler study.  She was on atorvastatin  for hyperlipidemia.  Due to the COVID pandemic, she never underwent a sleep study and upon further questioning today does not want to have one as long as she would have to have a COVID test.  She underwent an echo Doppler study on December 09, 2018 which revealed an EF of 45% with septal lateral dyssynchrony consistent with left bundle branch block.  There was mild aortic sclerosis without stenosis.  She was seen by [facility] on January 31, 2019 and at that time was having palpitations.  She admitted to being under significant recent stress.  There was some discussion concerning EP evaluation of her PVCs but she declined.   She underwent a home sleep study on May 03, 2019 which showed moderate overall sleep apnea with an AHI of 26.9/h but since it was a home study the severity of sleep apnea during  REM sleep was unable to be assessed.  Of note was that she had severe oxygen desaturation to a nadir of 74%.  She snored 32.3% during sleep.  She was referred to undergo a CPAP titration study on August 10, 2019.  Pressure was titrated to 11 cm with excellent result with O2 saturation 94%.  Apparently, she received a new CPAP machine with set up date September 25, 2019.  [employer] was her DME company.  Apparently, the patient never used the machine until May 25.  She had been contacted by the DME company of the importance of meeting compliance and that she will need to meet compliance by the 90-day window.  If she had continued use consistently  over the past month she would have met compliance but unfortunately usage was only 17 of 30 days.  At 11 cm, pressure was still elevated at 13.2.  Her machine was picked up by her DME company and she no longer has therapy.  She continues to notice occasional palpitations.  She denies chest pain or shortness of breath.  At her last evaluation with me in June 2021, I spent a significant amount of time with her.  She was considering the possibility of buying a CPAP machine on the Internet since her machine had been returned due to poor compliance.  In the past she had failed because demised oral appliance and had been treated by Dr. Micky.  Her EKG showed some PVCs occurring frequently and I recommended further titration of metoprolol  succinate to 50 mg daily.  She continues to have daytime sleepiness and was taken Nuvigil  on a as needed basis.  I saw her in September 2021 at which time she continued to have poor sleep.  She was continued to have daytime sleepiness. She has done some research on her own and would like to be evaluated for inspire technology.  She has subsequently seen Dr. Carlie of ENT. She continues to experience some shortness of breath but this seems to occur when she does not eat and seems to improve when she does eat.  She is not very active.  Her last echo Doppler study in February 2020 showed an EF of 45% with septal lateral dyssynchrony consistent with her left bundle branch block.   When I last saw her in August 30, 2020 she was having issues with inflammation.  She has been evaluated by orthopedist.  She has had balance issues.  She denies chest pain.  She underwent a CT angio of her chest and abdomen due to significant discomfort in November 2021.  There was no evidence for thoracoabdominal aortic aneurysm or dissection.  There was no evidence for acute intramural hematoma within the thoracic aorta.  There were no acute findings in the chest abdomen or pelvis.  There was evidence for left  colonic diverticulosis without diverticulitis and she had aortic atherosclerosis.  During her evaluation, her blood pressure was elevated despite taking irbesartan  3 mg daily, metoprolol  all succinate 50 mg apparently she had only been taking hydralazine  50 mg once a day.  She remotely had been on spironolactone  but was no longer on this.  I discussed with her the importance of at least taking hydralazine  twice a day.  I recommended further titration of metoprolol  succinate to 75 mg.  She had seen Dr. Carlie in the past for possible consideration for inspire technology with her untreated sleep apnea.  She was taking nuigil rarely for daytime sleepiness.  I saw her on December 31, 2020.  She was having difficulty  getting the pill count right when she gets her prescription filled.  She does not like cutting hydralazine  in half and has been taking 50 mg in the morning and 25 mg in the evening.  She has continued to be on irbesartan  300 mg a day in addition to metoprolol  succinate 75 mg daily for hypertension.  She continues to be on atorvastatin  40 mg for hyperlipidemia.  She states her sleep has been poor mainly due to back issues which make it difficult for her to rest comfortably.  She also complains of some left eye issues and has seen several ophthalmologists.  She  saw Dr. Kerrin in August 2023.  She had undergone robotic assisted left lower lobectomy for stage Ia adenocarcinoma in December 2022.  She had experienced intercostal neuralgia pain and briefly was on gabapentin .  She saw Dr. Gatha in July 2023 and a CT of her chest did not show evidence for recurrent disease but she was noted to have a small PE for which she was started on Eliquis .  I last saw her on April 09, 2022.  At that time shehas been having difficulty with her back and has been undergoing lumbar injections.  She also has had difficulty with her right knee and has been wearing a knee brace.  She states her blood pressure at home  typically is in the 130 systolic.  She admits to significant fatigue and being tired.  She no longer has CPAP.  Remotely she did benefit from an oral appliance but stopped using it since she felt it may have caused some teeth to fracture.  She denies any chest pain.  She denies recent bleeding on Eliquis .  During that evaluation, I recommended a reassessment of potential use of customized oral appliance.  I recommended simplification of her medication and changed hydralazine  to 50 mg twice a day since she was having difficulty with 3 times daily dosing.  Presently, she has continued to see Dr. Elsie Lesches for primary care.  She continues to be followed by Dr. Gatha in follow-up of her lungs CA status post resection.  She denies any chest pain or significant shortness of breath.  She has issues with frequent sleep awakenings, residual daytime sleepiness, snoring, and often takes a slight portion of armodafinil  in the morning to help her wake up.  She is unaware of any heart rate irregularity.  She presents for evaluation.  Past Medical History:  Diagnosis Date   Anemia    Anxiety    Asthma    related to sesonal allergies   Cancer (HCC)    Lung   Cataract    Chronic combined systolic and diastolic CHF, NYHA class 2 (HCC) CARDIOLOGIST-  DR [id number]   Coronary artery disease    Depression    History of colon polyps 07/2019   tubular adenoma - Dr. Luis   History of kidney stones    History of non-ST elevation myocardial infarction (NSTEMI) 11/27/2009   SECONDARY TO TAKOTSUDO SYNDROME (CARDIAC CATH NORMAL)   Hyperlipemia    Hypertension    Hypoglycemia    Hypothyroidism    LBBB (left bundle branch block)    Left ventricular ejection fraction less than 40%    38% PER CARDIOLOGIST NOTE (DR CROITORU)   Memory loss    Mood swings    Myocardial infarction (HCC) 2011   Nonischemic dilated cardiomyopathy (HCC)    MODERATELY DEPRESSED LVF;EF 35-45% by Echo 05/27/11   OSA (obstructive sleep  apnea) MODERATE PER STUDY 2005   CPAP NONCOMPLIANT   Pre-diabetes    Pulmonary embolism (HCC)    after the lobectomy   Seasonal allergies    Sleep apnea    SUI (stress urinary incontinence, female)     Past Surgical History:  Procedure Laterality Date   CARDIAC CATHETERIZATION  09-04-1999;  08/25/2004;   12/09/2009  DR FRANCYNE   NORMAL CORONARIES/  APICAL BALLOONING OF LV CONSISTENT WITH TAKOTSUBO SYMPTOMS/ EF 30-35%   CATARACT EXTRACTION W/ INTRAOCULAR LENS  IMPLANT, BILATERAL     CHOLECYSTECTOMY N/A 09/29/2012   Procedure: LAPAROSCOPIC CHOLECYSTECTOMY WITH INTRAOPERATIVE CHOLANGIOGRAM;  Surgeon: Elspeth KYM Schultze, MD;  Location: [facility] OR;  Service: General;  Laterality: N/A;   COLONOSCOPY  07/2020   CYSTOSCOPY N/A 09/19/2012   Procedure: PHYLLIS SIDE;  Surgeon: Alm GORMAN Fragmin, MD;  Location: [facility];  Service: Urology;  Laterality: N/A;   DILATION AND CURETTAGE OF UTERUS     EYE SURGERY Bilateral    cataract removal   INTERCOSTAL NERVE BLOCK  05/30/2021   Procedure: INTERCOSTAL NERVE BLOCK;  Surgeon: Kerrin Elspeth BROCKS, MD;  Location: [facility] OR;  Service: Thoracic;;   KNEE ARTHROSCOPY W/ MENISCECTOMY Right 07/27/2011   MEDIAL AND LATERAL   LOBECTOMY  05/30/2021   Procedure: LEFT LOWER LOBECTOMY;  Surgeon: Kerrin Elspeth BROCKS, MD;  Location: [facility] OR;  Service: Thoracic;;   LYMPH NODE DISSECTION  05/30/2021   Procedure: LYMPH NODE DISSECTION;  Surgeon: Kerrin Elspeth BROCKS, MD;  Location: [facility] OR;  Service: Thoracic;;   NASAL SEPTUM SURGERY  02/28/1979   PUBOVAGINAL SLING N/A 09/19/2012   Procedure: SUBURETHRAL OZIE GLADE;  Surgeon: Alm GORMAN Fragmin, MD;  Location: [facility];  Service: Urology;  Laterality: N/A;   RIGHT URETEROSCOPIC STONE EXTRACTION  08/31/2000   TRANSTHORACIC ECHOCARDIOGRAM  05-27-2011  DR CROITORU   MODERATELY DEPRESSED LVF DUE TO GLOBAL HYPOKINESIS AND MARKED SYSTOLIC ASYNCHRONY/ EF 38%/ MILD LEFT ATRIAL DILATATION   VAGINAL  HYSTERECTOMY  06/30/1983   partial    Current Medications: Outpatient Medications Prior to Visit  Medication Sig Dispense Refill   atorvastatin  (LIPITOR ) 80 MG tablet TAKE 1/2 TABLET BY MOUTH DAILY FOR CHOLESTEROL 45 tablet 3   diazepam  (VALIUM ) 10 MG tablet Take 10 mg by mouth every 6 (six) hours as needed for anxiety.     hydrALAZINE  (APRESOLINE ) 50 MG tablet Take 1 tablet (50 mg total) by mouth 2 (two) times daily. (Patient taking differently: Take 50 mg by mouth 2 (two) times daily. Pt is taking 75 mg) 180 tablet 3   irbesartan  (AVAPRO ) 300 MG tablet Take 300 mg by mouth daily.     levothyroxine  (SYNTHROID , LEVOTHROID) 88 MCG tablet Take 88 mcg by mouth daily before breakfast.     Melatonin 10 MG TABS Take 5 mg by mouth at bedtime as needed. Pt taking 5 mg a day     metoprolol  succinate (TOPROL -XL) 50 MG 24 hr tablet TAKE ONE AND ONE-HALF TABLETS BY MOUTH DAILY 90 tablet 3   NUVIGIL  250 MG tablet Take 250 mg by mouth daily as needed (energy boost).  3   polyethylene glycol (MIRALAX ) 17 g packet Take 17 g by mouth 3 (three) times daily.     No facility-administered medications prior to visit.     Allergies:   Codeine, Ciprofloxacin, Linzess [linaclotide], Ace inhibitors, Citalopram , Fetzima [levomilnacipran], Lasix [furosemide], Nsaids, Sulfamethoxazole-trimethoprim, and Xanax xr [alprazolam er]   Social History   Socioeconomic History   Marital status: Married  Spouse name: Richard   Number of children: 2   Years of education: 12+   Highest education level: Some college, no degree  Occupational History   Occupation: Retired   Occupation: retired  Tobacco Use   Smoking status: Never   Smokeless tobacco: Never  Vaping Use   Vaping status: Never Used  Substance and Sexual Activity   Alcohol use: No    Alcohol/week: 0.0 standard drinks of alcohol   Drug use: No   Sexual activity: Not Currently    Birth control/protection: Post-menopausal  Other Topics Concern   Not on  file  Social History Narrative   Lives at home with husband.   Right-handed.   Drinks 2-3 cups caffeine per day.   No regular exercise.   Social Drivers of Corporate Investment Banker Strain: Low Risk  (09/28/2022)   Received from [facility]   Overall Financial Resource Strain (CARDIA)    Difficulty of Paying Living Expenses: Not very hard  Food Insecurity: No Food Insecurity (09/28/2022)   Received [facility]   Hunger Vital Sign    Worried About Running Out of Food in the Last Year: Never true    Ran Out of Food in the Last Year: Never true  Transportation Needs: No Transportation Needs (09/28/2022)   Received from [facility] - Transportation    Lack of Transportation (Medical): No    Lack of Transportation (Non-Medical): No  Physical Activity: Not on file  Stress: No Stress Concern Present (02/05/2022)   Received from [facility], [facility] - Occupational Stress Questionnaire    Feeling of Stress : Not at all  Social Connections: Unknown (10/27/2021)   Received from [facility], [facility]   Social Network    Social Network: Not on file     She is married for over 64 I have I years to my patient Mr. Roxine Whittinghill.  She has 2 children and 1 grandchild.  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  She completed 12th grade of education.  She does not exercise.  She does not drink alcohol.  Family History:  The patient's family history includes Fibromyalgia in her brother; Heart attack in her father and paternal grandfather; Heart disease in her brother; Hypertension in her brother and mother; Pneumonia in her mother; Pulmonary embolism in her brother.   Her mother died at age 35 secondary to anaphylaxis.  Her father died at age 20 with a heart attack.  A brother died at age 7 with heart problems.  She has a living sister age 61.  ROS General: Negative; No fevers, chills, or  night sweats;  HEENT: Complains of left eye difficulty and has seen several ophthalmologist and was told that her vision is fine. No sinus congestion, difficulty swallowing Pulmonary: Positive for cough, asthma and bronchitis; recent small PE, left lower lobectomy for T1, N0, stage Ia adenocarcinoma Cardiovascular: No chest pain, positive for palpitations GI: Negative; No nausea, vomiting, diarrhea, or abdominal pain GU: Negative; No dysuria, hematuria, or difficulty voiding Musculoskeletal: Negative; no myalgias, joint pain, or weakness Hematologic/Oncology: Negative; no easy bruising, bleeding Endocrine: Negative; no heat/cold intolerance; no diabetes Neuro: Negative; no changes in balance, headaches Skin: Negative; No rashes or skin lesions Psychiatric: Negative; No behavioral problems, depression Sleep: History of OSA, previously documented to be severe.  Remotely used CPAP and remotely had a customized oral appliance.  Currently snores,  nocturia 2 times per night, nonrestorative sleep, and daytime sleepiness.  No restless legs, hypnogognic hallucinations, no cataplexy;  She received a new CPAP machine machine on September 25, 2019 but did not start using it until Nov 21, 2019 and unfortunately due to failure to meet compliance her machine was picked up by the DME company. Other comprehensive 14 point system review is negative.   PHYSICAL EXAM:   VS:  BP 124/68 (BP Location: Left Arm, Patient Position: Sitting)   Pulse 90   Ht 5' 3 (1.6 m)   Wt 160 lb (72.6 kg)   SpO2 96%   BMI 28.34 kg/m     Repeat blood pressure by me was 138/72.  Wt Readings from Last 3 Encounters:  07/05/23 160 lb (72.6 kg)  06/03/23 154 lb (69.9 kg)  04/22/23 154 lb (69.9 kg)    General: Alert, oriented, no distress.  Skin: normal turgor, no rashes, warm and dry HEENT: Normocephalic, atraumatic. Pupils equal round and reactive to light; sclera anicteric; extraocular muscles intact;  Nose without nasal septal  hypertrophy Mouth/Parynx benign; Mallinpatti scale 3 Neck: No JVD, no carotid bruits; normal carotid upstroke Lungs: clear to ausculatation and percussion; no wheezing or rales Chest wall: without tenderness to palpitation Heart: PMI not displaced, RRR, s1 s2 normal, 1/6 systolic murmur, no diastolic murmur, no rubs, gallops, thrills, or heaves Abdomen: soft, nontender; no hepatosplenomehaly, BS+; abdominal aorta nontender and not dilated by palpation. Back: no CVA tenderness Pulses 2+ Musculoskeletal: full range of motion, normal strength, no joint deformities Extremities: no clubbing cyanosis or edema, Homan's sign negative  Neurologic: grossly nonfocal; Cranial nerves grossly wnl Psychologic: Normal mood and affect     Studies/Labs Reviewed:   EKG Interpretation Date/Time:  Monday July 05 2023 11:25:04 EST Ventricular Rate:  68 PR Interval:    QRS Duration:  160 QT Interval:  456 QTC Calculation: 484 R Axis:   -20  Text Interpretation: Wide QRS rhythm Baseline wander ; Probable sinus rhythm Left bundle branch block When compared with ECG of 11-Dec-2021 17:32, Wide QRS rhythm has replaced Sinus rhythm Confirmed by Burnard Ned ([id number]) on 07/05/2023 4:52:07 PM     April 09, 2022 ECG (independently read by me): Sinus rhythm at 67,  PVCs, LBBB  December 31, 2020 ECG (independently read by me):  NSR at 76, PVC, LBBB  August 30, 2020 ECG (independently read by me):  Sinus rhythm at 81, occasional PVCs, LBBB  December 21, 2019 ECG (independently read by me): Sinus rhythm at 78 bpm with frequent PVCs.  Left bundle branch block with repolarization changes.  QTc interval 487 ms.  March 15, 2019 ECG (independently read by me): Sinus rhythm at 68 bpm, PVC, left bundle branch block with repolarization changes.  September 21, 2018 ECG (independently read by me): Sinus rhythm at 73 bpm with PACs, left bundle branch block with repolarization changes.  Recent Labs:    Latest Ref Rng & Units  01/19/2023   10:24 AM 07/22/2022    9:10 AM 01/13/2022   11:42 AM  BMP  Glucose 70 - 99 mg/dL 85  881  91   BUN 8 - 23 mg/dL 13  18  14    Creatinine 0.44 - 1.00 mg/dL 9.23  9.11  9.06   Sodium 135 - 145 mmol/L 141  139  140   Potassium 3.5 - 5.1 mmol/L 3.6  4.6  5.1   Chloride 98 - 111 mmol/L 106  105  106   CO2 22 -  32 mmol/L 28  30  32   Calcium  8.9 - 10.3 mg/dL 9.5  9.4  9.6         Latest Ref Rng & Units 01/19/2023   10:24 AM 07/22/2022    9:10 AM 01/13/2022   11:42 AM  Hepatic Function  Total Protein 6.5 - 8.1 g/dL 6.6  6.9  6.7   Albumin 3.5 - 5.0 g/dL 4.0  3.8  3.9   AST 15 - 41 U/L 22  15  14    ALT 0 - 44 U/L 19  14  12    Alk Phosphatase 38 - 126 U/L 65  64  64   Total Bilirubin 0.3 - 1.2 mg/dL 0.4  0.6  0.4        Latest Ref Rng & Units 01/19/2023   10:24 AM 07/22/2022    9:10 AM 01/13/2022   11:42 AM  CBC  WBC 4.0 - 10.5 K/uL 6.8  10.5  8.2   Hemoglobin 12.0 - 15.0 g/dL 87.6  87.3  87.3   Hematocrit 36.0 - 46.0 % 36.7  38.4  39.1   Platelets 150 - 400 K/uL 233  275  250    Lab Results  Component Value Date   MCV 78.6 (L) 01/19/2023   MCV 82.1 07/22/2022   MCV 81.8 01/13/2022   Lab Results  Component Value Date   TSH 1.02 09/19/2018   Lab Results  Component Value Date   HGBA1C 6.0 (H) 12/19/2018     BNP    Component Value Date/Time   BNP 21.7 05/15/2015 1339    ProBNP    Component Value Date/Time   PROBNP 65.0 09/19/2018 1532     Lipid Panel     Component Value Date/Time   CHOL 158 12/19/2018 1358   TRIG 113 12/19/2018 1358   HDL 57 12/19/2018 1358   CHOLHDL 2.8 12/19/2018 1358   CHOLHDL 5.1 08/23/2014 1623   VLDL 75 (H) 08/23/2014 1623   LDLCALC 78 12/19/2018 1358     RADIOLOGY: No results found.   Additional studies/ records that were reviewed today include:  I have reviewed the records of Dr. LELON. Jacques Somerset.   ECHO Doppler study was in August 2018 which showed moderate concentric LVH with abnormal septal wall motion due  to left bundle branch block.  EF 50%.  Moderate LA dilation.  Mild to moderate MR.  Carotid duplex imaging from April 01, 2017: 1 to 39% right and left proximal internal carotid stenoses  Lexiscan  Myoview  studies March 25, 2017: Normal perfusion with EF at 43% with abnormal septal motion consistent with left bundle branch block.  The visual EF appeared higher.  It was felt that a low EF was affected by the bundle branch block and  gating artifact due to PVCs.  ECHO 12/09/2018 IMPRESSIONS   1. The left ventricle has a visually estimated ejection fraction of 45%.  The cavity size was normal. There is moderately increased left ventricular  wall thickness. Left ventricular diastolic Doppler parameters are  consistent with impaired relaxation.  Left ventrical global hypokinesis without regional wall motion  abnormalities. Septal-lateral dyssynchrony consistent with LBBB.   2. The right ventricle has normal systolic function. The cavity was  normal. There is no increase in right ventricular wall thickness.   3. Left atrial size was mildly dilated.   4. No evidence of mitral valve stenosis. Trivial mitral regurgitation.   5. The aortic valve is tricuspid. Mild calcification of the aortic valve.  No  stenosis of the aortic valve.   6. The aortic root is normal in size and structure.   7. The IVC was normal in size. No complete TR doppler jet so unable to  estimate PA systolic pressure.    LONG TERM MONITOR 01/19/2020 The patient was monitored from June 28 through January 08, 2019 for 13 days and 12 hours.  The predominant rhythm was sinus rhythm with an average heart rate of 71 bpm.  There was bundle branch block/I VCD.  There were frequent isolated PVCs at 9.5%, occasional couplet at 1.5% and rare triplets less than 1%.   There were rare episodes of ventricular bigeminy and trigeminy.  There was 1 run of irregular ventricular tachycardia lasting 4 beats with an average rate of 128 with a maximum  rate at 174.  There were 2 supraventricular tachycardic episodes with the fastest interval 14 beats (average rate 120  CT CHEST: 01/13/2022 FINDINGS: Cardiovascular: Incidental pulmonary embolus identified within the right middle lobar pulmonary artery, image 75/2. Heart size normal. Aortic atherosclerosis. No pericardial effusion identified.   Mediastinum/Nodes: No enlarged mediastinal, hilar, or axillary lymph nodes. Thyroid  gland, trachea, and esophagus demonstrate no significant findings.   Lungs/Pleura: Postoperative changes from left lower lobectomy are identified. There are no complicating features identified. No pleural effusion, airspace consolidation, atelectasis or pneumothorax. Calcified granuloma identified within the right lower lobe, image 103/5. No new suspicious lung nodules identified.   Upper Abdomen: No acute findings within the imaged portions of the upper abdomen. The adrenal glands appear within normal limits. Status post cholecystectomy.   Musculoskeletal: No acute or suspicious osseous findings. There is ankylosis of the T7 through T12 vertebra. Degenerative disc disease noted within the upper thoracic and lower thoracic spine.   IMPRESSION: 1. Postoperative changes from left lower lobectomy. No signs of recurrent tumor or metastatic disease within the chest. 2. Incidental note is made of pulmonary embolus within the right middle lobar pulmonary artery. 3. Aortic Atherosclerosis (ICD10-I70.0).  ASSESSMENT:    1. OSA (obstructive sleep apnea)   2. Daytime sleepiness   3. Takotsubo syndrome, June 2011.(normal coronaries)   4. LBBB (left bundle branch block)   5. Hyperlipidemia with target LDL less than 70   6. Primary adenocarcinoma of lower lobe of left lung (HCC)   7. History of pulmonary embolus, right [facility]): July 2023, treated with Eliquis  for 6 months.    PLAN:  Ms. Jailine Lieder is a 78 year-old female who suffered a non-ST segment elevation  myocardial infarction in June 2011 at which time she was found to have findings consistent with Takotsubo cardiomyopathy.  EF at that time was 30 to 35%.  Coronary angiography revealed normal coronary arteries.  Her cardiomyopathy was nonischemic.  She was treated with initial medical therapy including ACE inhibition, low-dose beta-blocker therapy as well as diuretic.  Subsequently, she was followed  by Dr. Jacques Somerset.    A subsequent echo Doppler assessment EF was approximately 50%.  She has chronic left bundle branch block.  She has a history of hypertension and  has been managed with hydralazine  50 mg daily, irbesartan  300 mg, Toprol  XL 25 mg in addition to spironolactone  12.5 mg daily.   An echo Doppler study from June 2020  revealed an EF of approximately 45% and findings consistent with her left bundle branch block with septal dyssynergy and grade 1 diastolic dysfunction.  Her most recent echo of June 24, 2020 showed an EF of 40 to 45%, and left bundle branch  block creating abnormal septal motion.  There was grade 1 diastolic dysfunction.  She has had issues with hypertension and had some difficulty with some of her medications in the past.  Presently, she is on hydralazine  50 mg twice a day, irbesartan  300 mg daily, metoprolol  succinate 75 mg daily for blood pressure control.  She is on levothyroxine  88 mcg for hypothyroidism and takes atorvastatin  40 mg daily for hyperlipidemia.  She has untreated sleep apnea.  Her sleep presently remains poor and she experiences frequent awakenings.  Her sleep is nonrestorative.  When she wakes up she takes a bite of an armodafinil  pill to improve alertness.  She has no interest in read pursuing CPAP therapy.  She never gave it optimal try.  I have suggested that she be reevaluated by Dr. Oneil Forget for reconsideration of a new oral appliance.  He is followed by Dr. Gatha with her history of lung CA is also followed at urogynecology.  She has a history of  pulmonary embolism and was on Eliquis  January 13, 2022 and treated for 6 months with ultimate discontinuance.  I discussed with her my plans for retirement in June 2025.  I recommending that she undergo a follow-up echo Doppler study and will transition her to the care of Dr. Francyne to be seen in 6 months for evaluation.   Medication Adjustments/Labs and Tests Ordered: Current medicines are reviewed at length with the patient today.  Concerns regarding medicines are outlined above.  Medication changes, Labs and Tests ordered today are listed in the Patient Instructions below. Patient Instructions  Medication Instructions:  The current medical regimen is effective. Continue present plan and medications as directed. Please refer to the Current Medication list given to you today.    *If you need a refill on your cardiac medications before your next appointment, please call your pharmacy*   Lab Work: No labs were ordered during today's visit.   If you have labs (blood work) drawn today and your tests are completely normal, you will receive your results only by: MyChart Message (if you have MyChart) OR A paper copy in the mail If you have any lab test that is abnormal or we need to change your treatment, we will call you to review the results.   Testing/Procedures: Your physician has requested that you have an echocardiogram in June 2025. Echocardiography is a painless test that uses sound waves to create images of your heart. It provides your doctor with information about the size and shape of your heart and how well your heart's chambers and valves are working. This procedure takes approximately one hour. There are no restrictions for this procedure. Please do NOT wear cologne, perfume, aftershave, or lotions (deodorant is allowed). Please arrive 15 minutes prior to your appointment time.  Please note: We ask at that you not bring children with you during ultrasound (echo/ vascular) testing. Due  to room size and safety concerns, children are not allowed in the ultrasound rooms during exams. Our front office staff cannot provide observation of children in our lobby area while testing is being conducted. An adult accompanying a patient to their appointment will only be allowed in the ultrasound room at the discretion of the ultrasound technician under special circumstances. We apologize for any inconvenience.    Follow-Up: At [facility], you and your health needs are our priority.  As part of our continuing mission to provide you with exceptional heart care, we have created designated Provider Care Teams.  These Care Teams include your primary Cardiologist (physician) and Advanced Practice Providers (APPs -  Physician Assistants and Nurse Practitioners) who all work together to provide you with the care you need, when you need it.  We recommend signing up for the patient portal called MyChart.  Sign up information is provided on this After Visit Summary.  MyChart is used to connect with patients for Virtual Visits (Telemedicine).  Patients are able to view lab/test results, encounter notes, upcoming appointments, etc.  Non-urgent messages can be sent to your provider as well.   To learn more about what you can do with MyChart, go to [email or web address].    Your next appointment:   6 month(s)  Provider:   Dr.Christopher Kate     Other Instructions Thank you for choosing [facility]!       Signed, Debby Sor, MD  07/05/2023 5:24 PM    [facility] [address], [place], [place]  [address] Phone: [phone]

## 2023-07-05 NOTE — Patient Instructions (Signed)
 Medication Instructions:  The current medical regimen is effective. Continue present plan and medications as directed. Please refer to the Current Medication list given to you today.     *If you need a refill on your cardiac medications before your next appointment, please call your pharmacy*   Lab Work: No labs were ordered during today's visit.  If you have labs (blood work) drawn today and your tests are completely normal, you will receive your results only by: MyChart Message (if you have MyChart) OR A paper copy in the mail If you have any lab test that is abnormal or we need to change your treatment, we will call you to review the results.   Testing/Procedures: Your physician has requested that you have an echocardiogram in June 2025. Echocardiography is a painless test that uses sound waves to create images of your heart. It provides your doctor with information about the size and shape of your heart and how well your heart's chambers and valves are working. This procedure takes approximately one hour. There are no restrictions for this procedure. Please do NOT wear cologne, perfume, aftershave, or lotions (deodorant is allowed).   Please arrive 15 minutes prior to your appointment time.  Please note: We ask at that you not bring children with you during ultrasound (echo/ vascular) testing. Due to room size and safety concerns, children are not allowed in the ultrasound rooms during exams. Our front office staff cannot provide observation of children in our lobby area while testing is being conducted. An adult accompanying a patient to their appointment will only be allowed in the ultrasound room at the discretion of the ultrasound technician under special circumstances. We apologize for any inconvenience.    Follow-Up: At [facility], you and your health needs are our priority.  As part of our continuing mission to provide you with exceptional heart care, we have created  designated Provider Care Teams.  These Care Teams include your primary Cardiologist (physician) and Advanced Practice Providers (APPs -  Physician Assistants and Nurse Practitioners) who all work together to provide you with the care you need, when you need it.  We recommend signing up for the patient portal called MyChart.  Sign up information is provided on this After Visit Summary.  MyChart is used to connect with patients for Virtual Visits (Telemedicine).  Patients are able to view lab/test results, encounter notes, upcoming appointments, etc.  Non-urgent messages can be sent to your provider as well.   To learn more about what you can do with MyChart, go to [email or web address].    Your next appointment:   6 month(s)  Provider:   Dr. Lonni Nanas     Other Instructions Thank you for choosing [facility]!

## 2023-07-06 ENCOUNTER — Ambulatory Visit (INDEPENDENT_AMBULATORY_CARE_PROVIDER_SITE_OTHER): Payer: Medicare Other | Admitting: Obstetrics and Gynecology

## 2023-07-06 ENCOUNTER — Ambulatory Visit: Payer: Medicare Other | Admitting: Obstetrics and Gynecology

## 2023-07-06 ENCOUNTER — Encounter: Payer: Self-pay | Admitting: Obstetrics and Gynecology

## 2023-07-06 VITALS — BP 151/89 | HR 96

## 2023-07-06 DIAGNOSIS — N393 Stress incontinence (female) (male): Secondary | ICD-10-CM | POA: Diagnosis not present

## 2023-07-06 DIAGNOSIS — N8110 Cystocele, unspecified: Secondary | ICD-10-CM | POA: Diagnosis not present

## 2023-07-06 DIAGNOSIS — N816 Rectocele: Secondary | ICD-10-CM | POA: Diagnosis not present

## 2023-07-06 DIAGNOSIS — R350 Frequency of micturition: Secondary | ICD-10-CM

## 2023-07-06 LAB — POCT URINALYSIS DIPSTICK
Bilirubin, UA: NEGATIVE
Glucose, UA: NEGATIVE
Ketones, UA: NEGATIVE
Leukocytes, UA: NEGATIVE
Nitrite, UA: NEGATIVE
Protein, UA: NEGATIVE
Spec Grav, UA: >=1.03 — AB (ref 1.010–1.025)
Urobilinogen, UA: 0.2 U/dL
pH, UA: 6 (ref 5.0–8.0)

## 2023-07-06 NOTE — Progress Notes (Signed)
 [facility] Return Visit  SUBJECTIVE  History of Present Illness: Laura [name] is a 78 y.o. female seen in follow-up for prolapse and incontinence. She has a ring with support pessary which has been working well for her. Since last visit, has followed up with GI and has been diagnosed with SIBO. She had a colonoscopy and since that time has been taking Miralax  TID. She feels this has helped her stool be a lot softer. But she is not having daily BM's. She has to strain at times and notices the pessary with straining but it does not fall out.   Reports she sees black sediment in her urine. Has a history of kidney stones and had CT A/P on 04/2023 which did not show any renal abnormalities. Does not have any UTI symptoms otherwise. She sometimes has urge incontinence and other times will leak with coughing and movement.    Past Medical History: Patient  has a past medical history of Anemia, Anxiety, Asthma, Cancer (HCC), Cataract, Chronic combined systolic and diastolic CHF, NYHA class 2 (HCC) (CARDIOLOGIST-  DR RMNPUNML), Coronary artery disease, Depression, History of colon polyps (07/2019), History of kidney stones, History of non-ST elevation myocardial infarction (NSTEMI) (11/27/2009), Hyperlipemia, Hypertension, Hypoglycemia, Hypothyroidism, LBBB (left bundle branch block), Left ventricular ejection fraction less than 40%, Memory loss, Mood swings, Myocardial infarction (HCC) (2011), Nonischemic dilated cardiomyopathy (HCC), OSA (obstructive sleep apnea) (MODERATE PER STUDY 2005), Pre-diabetes, Pulmonary embolism (HCC), Seasonal allergies, Sleep apnea, and SUI (stress urinary incontinence, female).   Past Surgical History: She  has a past surgical history that includes Nasal septum surgery (02/28/1979); Vaginal hysterectomy (06/30/1983); Cataract extraction w/ intraocular lens  implant, bilateral; RIGHT URETEROSCOPIC STONE EXTRACTION (08/31/2000); Knee arthroscopy w/ meniscectomy  (Right, 07/27/2011); transthoracic echocardiogram (05-27-2011  DR CROITORU); Cardiac catheterization (09-04-1999;  08/25/2004;   12/09/2009  DR CROITORU); Pubovaginal sling (N/A, 09/19/2012); Cystoscopy (N/A, 09/19/2012); Cholecystectomy (N/A, 09/29/2012); Colonoscopy (07/2020); Eye surgery (Bilateral); Dilation and curettage of uterus; Lobectomy (05/30/2021); Intercostal nerve block (05/30/2021); and Lymph node dissection (05/30/2021).   Medications: She has a current medication list which includes the following prescription(s): atorvastatin , diazepam , hydralazine , irbesartan , levothyroxine , melatonin, metoprolol  succinate, nuvigil , and polyethylene glycol.   Allergies: Patient is allergic to codeine, ciprofloxacin, linzess [linaclotide], ace inhibitors, citalopram , fetzima [levomilnacipran], lasix [furosemide], nsaids, sulfamethoxazole-trimethoprim, and xanax xr [alprazolam er].   Social History: Patient  reports that she has never smoked. She has never used smokeless tobacco. She reports that she does not drink alcohol and does not use drugs.     OBJECTIVE     Physical Exam: Vitals:   07/06/23 1258  BP: (!) 151/89  Pulse: 96   Gen: No apparent distress, A&O x 3.  Detailed Urogynecologic Evaluation:  Pessary noted to be in place. It was removed and cleaned. Speculum placed and mucosa appears normal- small abrasion at the apex with no active bleeding present.    ASSESSMENT AND PLAN    Laura [name] is a 78 y.o. with:  1. Prolapse of anterior vaginal wall   2. Prolapse of posterior vaginal wall   3. SUI (stress urinary incontinence, female)    - Will check urine today due to sediment. No stones on recent CT but will plan for cysto due to history of sling.  - We discussed option of urethral bulking for SUI symptoms and handout provided. She may be interested in this but with her history then would recommend urodynamic testing. We discussed that incontinence is due to urgency, may benefit  from  medication.  - Plan for pessary check q3 months -  constipation managed by GI- currently on miralax  TID  Return for urodynamics and cystoscopy   Laura LOISE Caper, MD

## 2023-07-06 NOTE — Addendum Note (Signed)
 Addended by: Salina April on: 07/06/2023 02:28 PM   Modules accepted: Orders

## 2023-07-07 NOTE — Telephone Encounter (Signed)
 Patient has finished the flagyl and wants to know what are her next steps. The medication helped some but she still has some symptoms she wishes to discuss. Please advise.

## 2023-07-07 NOTE — Telephone Encounter (Signed)
 Spoke to the patient.  Patient recently saw OB/GYN Dr. Marilynne and at that time she was still having some upper GI symptoms. Ever since her gallbladder removal in 2013, she still has some nausea issues, which have worsened over time. She does not think this is related to the type of foods that she is eating. Flagyl  helped with her symptoms including nausea.  However she is does still have some residual symptoms.  Constipation is better initially but her bowel habits have slowed down. When her constipation was better, she did not have any significant difference in her nausea.  I offered her another course of antibiotics versus some antinausea medication versus further evaluation of a source of her nausea.  Patient at this time is interested in another course of antibiotics.  Let's see if there is a 14-day course of rifaximin  550 mg TID samples that might be available to the patient.  If we do not have any samples available, then let's treat with doxycycline  100 mg BID for 10 days.   Tisha, please call the patient to let her know whether or not we have any rifaximin  samples available. If not, then please send in doxycycline  for her. Thanks.

## 2023-07-08 NOTE — Telephone Encounter (Signed)
 Patient called stating her husband will be coming tomorrow morning to pick up her samples.

## 2023-07-28 ENCOUNTER — Encounter: Payer: Self-pay | Admitting: Obstetrics and Gynecology

## 2023-07-28 ENCOUNTER — Ambulatory Visit (INDEPENDENT_AMBULATORY_CARE_PROVIDER_SITE_OTHER): Payer: Medicare Other | Admitting: Obstetrics and Gynecology

## 2023-07-28 VITALS — BP 120/65 | HR 112

## 2023-07-28 DIAGNOSIS — R948 Abnormal results of function studies of other organs and systems: Secondary | ICD-10-CM | POA: Diagnosis not present

## 2023-07-28 DIAGNOSIS — R82998 Other abnormal findings in urine: Secondary | ICD-10-CM | POA: Diagnosis not present

## 2023-07-28 DIAGNOSIS — N393 Stress incontinence (female) (male): Secondary | ICD-10-CM

## 2023-07-28 DIAGNOSIS — R350 Frequency of micturition: Secondary | ICD-10-CM

## 2023-07-28 LAB — POCT URINALYSIS DIPSTICK
Bilirubin, UA: NEGATIVE
Blood, UA: NEGATIVE
Glucose, UA: NEGATIVE
Ketones, UA: NEGATIVE
Leukocytes, UA: NEGATIVE — AB
Nitrite, UA: NEGATIVE
Protein, UA: NEGATIVE
Spec Grav, UA: <=1.005 — AB (ref 1.010–1.025)
Urobilinogen, UA: 0.2 U/dL
pH, UA: 6.5 (ref 5.0–8.0)

## 2023-07-28 NOTE — Progress Notes (Deleted)
Urodynamic

## 2023-07-28 NOTE — Progress Notes (Signed)
[facility] Urodynamics Procedure  Referring Physician: Noberto Retort, MD Date of Procedure: 07/28/2023  Laura [name] is a 78 y.o. female who presents for urodynamic evaluation. Indication(s) for study: SUI  Vital Signs: BP 120/65   Pulse (!) 112   Laboratory Results: A catheterized urine specimen revealed:  Lab Results  Component Value Date   COLORU yellow 07/28/2023   CLARITYU clear 07/28/2023   GLUCOSEUR Negative 07/28/2023   BILIRUBINUR negative 07/28/2023   KETONESU negative 07/28/2023   SPECGRAV <=1.005 (A) 07/28/2023   RBCUR negative 07/28/2023   PHUR 6.5 07/28/2023   PROTEINUR Negative 07/28/2023   UROBILINOGEN 0.2 07/28/2023   LEUKOCYTESUR Negative (A) 07/28/2023      Voiding Diary: Deferred   Procedure Timeout:  The correct patient was verified and the correct procedure was verified. The patient was in the correct position and safety precautions were reviewed based on at the patient's history.  Urodynamic Procedure A 68F dual lumen urodynamics catheter was placed under sterile conditions into the patient's bladder. A 68F catheter was placed into the rectum in order to measure abdominal pressure. EMG patches were placed in the appropriate position.  All connections were confirmed and calibrations/adjusted made. Saline was instilled into the bladder through the dual lumen catheters.  Cough/valsalva pressures were measured periodically during filling.  Patient was allowed to void.  The bladder was then emptied of its residual.  UROFLOW: Revealed a Qmax of 16 mL/sec.  She voided 353 mL and had a residual of 100 mL.  It was a interrupted pattern and represented normal habits.   CMG: This was performed with sterile water in the sitting position at a fill rate of 30 mL/min.    First sensation of fullness was  110 mLs,  First urge was 253 mLs,  Strong urge was 499 mLs and  Capacity was 915 mLs  Stress incontinence was demonstrated Highest  positive Barrier CLPP was 107 cmH20 at 353 ml. Highest positive Barrier VLPP was 103 cmH20 at 555 ml.  Detrusor function was underactive, with no phasic contractions seen.    Compliance:  Normal. End fill detrusor pressure was 0cmH20.   UPP: MUCP with barrier reduction was 50 cm of water.    MICTURITION STUDY: Voiding was performed with reduction using scopettes in the sitting position.  Pdet at Qmax was 6.3 cm of water.  Qmax was 23 mL/sec.  It was a normal pattern.  She voided 440 mL and had a residual of 475 mL.  It was a volitional void, sustained detrusor contraction was present and abdominal straining was present  EMG: This was performed with patches.  She had voluntary contractions, recruitment with fill was present and urethral sphincter was not relaxed with void.  The details of the procedure with the study tracings have been scanned into EPIC.   Urodynamic Impression:  1. Sensation was reduced; capacity was increased 2. Stress Incontinence was demonstrated at normal pressures; 3. Detrusor Overactivity was not demonstrated.  4. Emptying was dysfunctional with a elevated PVR ( ), a sustained detrusor contraction present,  abdominal straining present, dyssynergic urethral sphincter activity on EMG.  Plan: - The patient will follow up  to discuss the findings and treatment options.  We discussed her prolapse using the Pop-Q tool and I have given her a picture that shows anterior vaginal wall prolapse and posterior vaginal prolapse.  -Patient reported that she does not want surgical intervention but would be tempted if it fixed the prolapse.  -Patient to bring  her daughter to her follow up appointment to discuss options.

## 2023-07-28 NOTE — Patient Instructions (Signed)
Taking Care of Yourself after Urodynamics, Cystoscopy Drink plenty of water for a day or two following your procedure. Try to have about 8 ounces (one cup) at a time, and do this 6 times or more per day unless you have fluid restrictitons AVOID irritative beverages such as coffee, tea, soda, alcoholic or citrus drinks for a day or two, as this may cause burning with urination.  For the first 1-2 days after the procedure, your urine may be pink or red in color. You may have some blood in your urine as a normal side effect of the procedure. Large amounts of bleeding or difficulty urinating are NOT normal. Call the nurse line if this happens or go to the nearest Emergency Room if the bleeding is heavy or you cannot urinate at all and it is after hours. If you had a Bulkamid injection in the urethra and need to be catheterized, ask for a pediatric catheter to be used (size 10 or 12-French) so the material is not pushed out of place.   You may experience some discomfort or a burning sensation with urination after having this procedure. You can use over the counter Azo or pyridium to help with burning and follow the instructions on the packaging. If it does not improve within 1-2 days, or other symptoms appear (fever, chills, or difficulty urinating) call the office to speak to a nurse.  You may return to normal daily activities such as work, school, driving, exercising and housework on the day of the procedure.

## 2023-07-29 NOTE — Addendum Note (Signed)
Addended by: Marguerita Beards on: 07/29/2023 10:12 AM   Modules accepted: Level of Service

## 2023-07-29 NOTE — Progress Notes (Signed)
CYSTOSCOPY  CC:  This is a 78 y.o. with  urine sediment (possible hematuria) and history of sling  who presents today for cystoscopy.  POC urine: negative  BP 120/65   Pulse (!) 112   CYSTOSCOPY: A time out was performed.  The periurethral area was prepped and draped in a sterile manner.  2% lidocaine jetpack was inserted at the urethral meatus and the urethra and bladder visualized with 0- and 70-degree scopes.  She had normal urethral coaptation and normal urethral mucosa.  She had normal bladder mucosa. She had bilateral clear efflux from both ureteral orifices.  She had a small amount of metaplasia at the trigone. There were no no trabeculations, cellules or diverticuli.     ASSESSMENT:  78 y.o. with urinary sediment and history of a sling.   PLAN:  - Normal cystoscopy today. No concerns for mesh erosion.  - She will proceed with urodynamics and follow up after as planned.   Marguerita Beards, MD

## 2023-08-04 ENCOUNTER — Encounter: Payer: Self-pay | Admitting: Obstetrics and Gynecology

## 2023-08-04 ENCOUNTER — Ambulatory Visit (INDEPENDENT_AMBULATORY_CARE_PROVIDER_SITE_OTHER): Payer: Medicare Other | Admitting: Obstetrics and Gynecology

## 2023-08-04 VITALS — BP 147/73 | HR 88

## 2023-08-04 DIAGNOSIS — M62838 Other muscle spasm: Secondary | ICD-10-CM

## 2023-08-04 DIAGNOSIS — R339 Retention of urine, unspecified: Secondary | ICD-10-CM | POA: Diagnosis not present

## 2023-08-04 DIAGNOSIS — N3644 Muscular disorders of urethra: Secondary | ICD-10-CM

## 2023-08-04 MED ORDER — CYCLOBENZAPRINE HCL 5 MG PO TABS
5.0000 mg | ORAL_TABLET | Freq: Three times a day (TID) | ORAL | 5 refills | Status: DC | PRN
Start: 2023-08-04 — End: 2024-03-28

## 2023-08-04 NOTE — Progress Notes (Signed)
 [facility] Return Visit  SUBJECTIVE  History of Present Illness: Laura [name] is a 78 y.o. female seen in follow-up for incontinence and prolapse. Currently has ring with support pessary. Last visit she underwent urodynamic testing and cystoscopy. Has a history of a sling Laura [name]).   Urodynamic Impression:  1. Sensation was reduced; capacity was increased 2. Stress Incontinence was demonstrated at normal pressures; 3. Detrusor Overactivity was not demonstrated.  4. Emptying was dysfunctional with a elevated PVR ( )- initial uroflow without prolapse reduction PVR was .  a small but sustained detrusor contraction present,  abdominal straining present, dyssynergic urethral sphincter activity on EMG.  She has been taking miralax  daily. This is helping to keep her bowel movements soft.  Past Medical History: Patient  has a past medical history of Anemia, Anxiety, Asthma, Cancer (HCC), Cataract, Chronic combined systolic and diastolic CHF, NYHA class 2 (HCC) (CARDIOLOGIST-  DR [id number]), Coronary artery disease, Depression, History of colon polyps (07/2019), History of kidney stones, History of non-ST elevation myocardial infarction (NSTEMI) (11/27/2009), Hyperlipemia, Hypertension, Hypoglycemia, Hypothyroidism, LBBB (left bundle branch block), Left ventricular ejection fraction less than 40%, Memory loss, Mood swings, Myocardial infarction (HCC) (2011), Nonischemic dilated cardiomyopathy (HCC), OSA (obstructive sleep apnea) (MODERATE PER STUDY 2005), Pre-diabetes, Pulmonary embolism (HCC), Seasonal allergies, Sleep apnea, and SUI (stress urinary incontinence, female).   Past Surgical History: She  has a past surgical history that includes Nasal septum surgery (02/28/1979); Vaginal hysterectomy (06/30/1983); Cataract extraction w/ intraocular lens  implant, bilateral; RIGHT URETEROSCOPIC STONE EXTRACTION (08/31/2000); Knee arthroscopy w/ meniscectomy (Right, 07/27/2011);  transthoracic echocardiogram (05-27-2011  DR CROITORU); Cardiac catheterization (09-04-1999;  08/25/2004;   12/09/2009  DR CROITORU); Pubovaginal sling (N/A, 09/19/2012); Cystoscopy (N/A, 09/19/2012); Cholecystectomy (N/A, 09/29/2012); Colonoscopy (07/2020); Eye surgery (Bilateral); Dilation and curettage of uterus; Lobectomy (05/30/2021); Intercostal nerve block (05/30/2021); and Lymph node dissection (05/30/2021).   Medications: She has a current medication list which includes the following prescription(s): atorvastatin , cyclobenzaprine , diazepam , hydralazine , irbesartan , levothyroxine , melatonin, metoprolol  succinate, nuvigil , and polyethylene glycol.   Allergies: Patient is allergic to codeine, ciprofloxacin, linzess [linaclotide], ace inhibitors, citalopram , fetzima [levomilnacipran], lasix [furosemide], nsaids, sulfamethoxazole-trimethoprim, and xanax xr [alprazolam er].   Social History: Patient  reports that she has never smoked. She has never used smokeless tobacco. She reports that she does not drink alcohol and does not use drugs.     OBJECTIVE     Physical Exam: Vitals:   08/04/23 1515  BP: (!) 147/73  Pulse: 88   Gen: No apparent distress, A&O x 3.  Detailed Urogynecologic Evaluation:  Deferred.    ASSESSMENT AND PLAN    Laura [name] is a 78 y.o. with:  1. Incomplete bladder emptying   2. Levator spasm   3. Detrusor sphincter dyssynergia     - We discussed the results of the urodynamic testing in depth. It appears that she has a low detrusor function, with pelvic floor dyssynergia resulting in incomplete bladder emptying. This is complicated by her history of prolapse and a prior sling placement 11 years ago.  - We discussed that the sling may be contributing to incomplete emptying, although since it was placed so long ago and is only having issues recently, cannot say for sure that a sling release will resolve her issues.  - She has symptoms of chronic constipation due  to dyssynergia and has undergone pelvic PT with minimal improvement.  - She did not improve her emptying with prolapse reduction during urodynamics and still has symptoms of overflow incontinence even  with the pessary in place. Therefore, it is unclear if surgery for her prolapse would improve her emptying.  - We discussed that since she is not emptying well, then this is likely causing overflow and worsening SUI symptoms. However, treatment for OAB or SUI may worsen emptying issues so is not recommended.  - Recommended pelvic PT. She has done this previously be it was about a year ago so may be beneficial to visit again. Also will try a muscle relaxant to help reduce tension in the pelvic floor. Prescribed flexeril  5mg  daily.  - Also recommended learning to self-catheterize so we can keep a voiding diary with accurate PVRs to get a better picture of how well she empties. She is not sure she would be able to do the catheterization, so will start with the other interventions and readdress at next visit.  - Also will plan for a 3 month pessary cleaning   Laura [name] Laura Caper, MD  Time spent: I spent 45 minutes dedicated to the care of this patient on the date of this encounter to include pre-visit review of records, face-to-face time with the patient discussing testing results and treatment options and post visit documentation.

## 2023-08-10 ENCOUNTER — Ambulatory Visit: Payer: Medicare Other | Admitting: Thoracic Surgery (Cardiothoracic Vascular Surgery)

## 2023-08-31 ENCOUNTER — Other Ambulatory Visit: Payer: Self-pay | Admitting: Cardiovascular Disease

## 2023-09-09 ENCOUNTER — Ambulatory Visit (INDEPENDENT_AMBULATORY_CARE_PROVIDER_SITE_OTHER): Payer: Medicare Other | Admitting: Internal Medicine

## 2023-09-09 ENCOUNTER — Encounter: Payer: Self-pay | Admitting: Internal Medicine

## 2023-09-09 VITALS — BP 130/80 | HR 82 | Ht 63.0 in | Wt 158.0 lb

## 2023-09-09 DIAGNOSIS — Z9049 Acquired absence of other specified parts of digestive tract: Secondary | ICD-10-CM

## 2023-09-09 DIAGNOSIS — R159 Full incontinence of feces: Secondary | ICD-10-CM

## 2023-09-09 DIAGNOSIS — K5900 Constipation, unspecified: Secondary | ICD-10-CM

## 2023-09-09 DIAGNOSIS — K31A0 Gastric intestinal metaplasia, unspecified: Secondary | ICD-10-CM | POA: Diagnosis not present

## 2023-09-09 DIAGNOSIS — R140 Abdominal distension (gaseous): Secondary | ICD-10-CM

## 2023-09-09 DIAGNOSIS — Z8719 Personal history of other diseases of the digestive system: Secondary | ICD-10-CM

## 2023-09-09 DIAGNOSIS — R1030 Lower abdominal pain, unspecified: Secondary | ICD-10-CM

## 2023-09-09 NOTE — Progress Notes (Signed)
 Chief Complaint: Constipation, bloating  HPI : 78 year old female with history of anxiety, lung adenocarcinoma s/p LLL lobectomy in 05/2021, HFpEF (EF 45-50%), COPD, CAD, hypothyroidism, OSA, PE, and HTN presents for follow up of constipation and bloating.  Since 2020, she has had issues with constipation and bloating. She has chronic back pain issues. She was diagnosed with PE when she was being worked up for lung cancer. She took Eliquis for a period of time and stopped the Eliquis in 06/2022. Her lung cancer was treated with a LLL lobectomy. She was having trouble with constipation when she was getting treated for lung cancer. She was referred to pelvic floor physical therapist and she completed 5 sessions. She was also given a pessary. Currently she has to use an enema daily to have BMs regularly. She has abdominal discomfort and feels sick to her stomach. She has always had nausea ever since she had her gallbladder removed. She used to be on opioids but is off of all of them now. Denies rectal bleeding. She is on average having one BM every day. She does feel gas and bloating in the morning. She feels like she has to go to the bathroom but cannot do so. She is drinking plenty of water. She is not very physically active due to her arthritis. She has tried drinking prune juice and tries to eat plenty of fruit to get enough fiber. Endorses nausea. Denies vomiting. Her abdominal discomfort is mostly in the lower abdomen. She is family history of IBS.  From Dr. Yevonne Pax last note 03/04/22: "Although Rilea has opioids in her profile she reports she rarely uses them. Her story today revolves mostly around her back pain issues, but her episodic abdominal pain and bloating is likely the result of a combination of pelvic floor dysfunction and potentially a component of neurogenic bowel given all these back issues and injections she has had. I make note that she has no structural issues that I can see based on prior  colonoscopy and imaging. In addition, she reports to me that almost every laxative regimen (Lactulose, Miralax, stiumulants, Amitixa, Linzess, Trulance, IBSRela) have failed her. I feel she needs to see Pelvic health for further evaluation and recommendations here, as I suspect a strong component of pelvic floor dysfunction."  Interval History: Her constipation has improved. She has not had to use an enema. She has been following with urogynecology and they recommended pelvic floor PT. The patient doesn't think that she is able to do the pelvic floor PT because she is worried about her back. She already has to work with a chiropractor to help with her back pain. She is now taking Miralax once a day in her coffee to help with constipation. When she took the rifaximin, her stomach feels a lot better. Stool leakage is a bit better. Only has nausea every once a while. Ab discomfort is better.  Wt Readings from Last 3 Encounters:  09/09/23 158 lb (71.7 kg)  07/05/23 160 lb (72.6 kg)  06/03/23 154 lb (69.9 kg)   Past Medical History:  Diagnosis Date   Anemia    Anxiety    Asthma    related to sesonal allergies   Cancer (HCC)    Lung   Cataract    Chronic combined systolic and diastolic CHF, NYHA class 2 (HCC) CARDIOLOGIST-  DR [id number]   Coronary artery disease    Depression    History of colon polyps 07/2019   tubular adenoma - Dr.  Medoff   History of kidney stones    History of non-ST elevation myocardial infarction (NSTEMI) 11/27/2009   SECONDARY TO TAKOTSUDO SYNDROME (CARDIAC CATH NORMAL)   Hyperlipemia    Hypertension    Hypoglycemia    Hypothyroidism    LBBB (left bundle branch block)    Left ventricular ejection fraction less than 40%    38% PER CARDIOLOGIST NOTE (DR CROITORU)   Memory loss    Mood swings    Myocardial infarction (HCC) 2011   Nonischemic dilated cardiomyopathy (HCC)    MODERATELY DEPRESSED LVF;EF 35-45% by Echo 05/27/11   OSA (obstructive sleep apnea) MODERATE  PER STUDY 2005   CPAP NONCOMPLIANT   Pre-diabetes    Pulmonary embolism (HCC)    after the lobectomy   Seasonal allergies    Sleep apnea    SUI (stress urinary incontinence, female)      Past Surgical History:  Procedure Laterality Date   CARDIAC CATHETERIZATION  09-04-1999;  08/25/2004;   12/09/2009  DR Royann Shivers   NORMAL CORONARIES/  APICAL BALLOONING OF LV CONSISTENT WITH TAKOTSUBO SYMPTOMS/ EF 30-35%   CATARACT EXTRACTION W/ INTRAOCULAR LENS  IMPLANT, BILATERAL     CHOLECYSTECTOMY N/A 09/29/2012   Procedure: LAPAROSCOPIC CHOLECYSTECTOMY WITH INTRAOPERATIVE CHOLANGIOGRAM;  Surgeon: Ardeth Sportsman, MD;  Location: [facility] OR;  Service: General;  Laterality: N/A;   COLONOSCOPY  07/2020   CYSTOSCOPY N/A 09/19/2012   Procedure: Derinda Late;  Surgeon: Valetta Fuller, MD;  Location: [facility];  Service: Urology;  Laterality: N/A;   DILATION AND CURETTAGE OF UTERUS     EYE SURGERY Bilateral    cataract removal   INTERCOSTAL NERVE BLOCK  05/30/2021   Procedure: INTERCOSTAL NERVE BLOCK;  Surgeon: Loreli Slot, MD;  Location: [facility] OR;  Service: Thoracic;;   KNEE ARTHROSCOPY W/ MENISCECTOMY Right 07/27/2011   MEDIAL AND LATERAL   LOBECTOMY  05/30/2021   Procedure: LEFT LOWER LOBECTOMY;  Surgeon: Loreli Slot, MD;  Location: [facility] OR;  Service: Thoracic;;   LYMPH NODE DISSECTION  05/30/2021   Procedure: LYMPH NODE DISSECTION;  Surgeon: Loreli Slot, MD;  Location: [facility] OR;  Service: Thoracic;;   NASAL SEPTUM SURGERY  02/28/1979   PUBOVAGINAL SLING N/A 09/19/2012   Procedure: SUBURETHRAL Elio Forget;  Surgeon: Valetta Fuller, MD;  Location: [facility];  Service: Urology;  Laterality: N/A;   RIGHT URETEROSCOPIC STONE EXTRACTION  08/31/2000   TRANSTHORACIC ECHOCARDIOGRAM  05-27-2011  DR CROITORU   MODERATELY DEPRESSED LVF DUE TO GLOBAL HYPOKINESIS AND MARKED SYSTOLIC ASYNCHRONY/ EF 38%/ MILD LEFT ATRIAL DILATATION   VAGINAL HYSTERECTOMY   06/30/1983   partial   Family History  Problem Relation Age of Onset   Pneumonia Mother    Hypertension Mother    Heart attack Father    Fibromyalgia Brother    Pulmonary embolism Brother    Hypertension Brother    Heart disease Brother    Heart attack Paternal Grandfather    Breast cancer Neg Hx    Colon cancer Neg Hx    Esophageal cancer Neg Hx    Rectal cancer Neg Hx    Stomach cancer Neg Hx    Social History   Tobacco Use   Smoking status: Never   Smokeless tobacco: Never  Vaping Use   Vaping status: Never Used  Substance Use Topics   Alcohol use: No    Alcohol/week: 0.0 standard drinks of alcohol   Drug use: No   Current Outpatient Medications  Medication Sig Dispense Refill   atorvastatin (LIPITOR) 80 MG tablet TAKE 1/2 TABLET BY MOUTH DAILY FOR CHOLESTEROL 45 tablet 3   diazepam (VALIUM) 10 MG tablet Take 10 mg by mouth every 6 (six) hours as needed for anxiety.     hydrALAZINE (APRESOLINE) 50 MG tablet TAKE 1 TABLET BY MOUTH TWICE A DAY (Patient taking differently: Take 75 mg by mouth 2 (two) times daily.) 180 tablet 2   irbesartan (AVAPRO) 300 MG tablet Take 300 mg by mouth daily.     levothyroxine (SYNTHROID, LEVOTHROID) 88 MCG tablet Take 88 mcg by mouth daily before breakfast.     Melatonin 10 MG TABS Take by mouth at bedtime as needed. Pt taking 21/2 mg a day     metoprolol succinate (TOPROL-XL) 50 MG 24 hr tablet TAKE ONE AND ONE-HALF TABLETS BY MOUTH DAILY (Patient taking differently: Pt taking 75 mg a day) 90 tablet 3   NUVIGIL 250 MG tablet Take 250 mg by mouth daily as needed (energy boost).  3   polyethylene glycol (MIRALAX) 17 g packet Take 17 g by mouth 3 (three) times daily.     cyclobenzaprine (FLEXERIL) 5 MG tablet Take 1 tablet (5 mg total) by mouth 3 (three) times daily as needed for muscle spasms. (Patient not taking: Reported on 09/09/2023) 60 tablet 5   No current facility-administered medications for this visit.   Allergies  Allergen  Reactions   Codeine Anaphylaxis, Hives and Other (See Comments)    Headache. Daughter reported that it caused her throat to swell up    Ciprofloxacin Nausea And Vomiting   Linzess [Linaclotide] Nausea Only   Ace Inhibitors Other (See Comments)    Unknown- it "didn't agree with her"    Citalopram Other (See Comments)    Fatigue   Fetzima [Levomilnacipran] Other (See Comments)    "Talking out of my head"   Lasix [Furosemide] Other (See Comments)    HEADACHE   Nsaids Other (See Comments)    Told not to take NSAIDs because of her heart    Sulfamethoxazole-Trimethoprim Other (See Comments)    NERVOUS AND DISORENTED   Xanax Xr [Alprazolam Er] Other (See Comments)    confusion    Physical Exam: BP 130/80   Pulse 82   Ht 5\' 3"  (1.6 m)   Wt 158 lb (71.7 kg)   BMI 27.99 kg/m  Constitutional: Pleasant,well-developed, female in no acute distress. HEENT: Normocephalic and atraumatic. Conjunctivae are normal. No scleral icterus. Cardiovascular: Normal rate Pulmonary/chest: Effort normal Abdominal: Soft, nondistended, nontender Extremities: No edema Neurological: Alert and oriented to person place and time. Skin: Skin is warm and dry. No rashes noted. Psychiatric: Normal mood and affect. Behavior is normal.  Labs 01/2022: TSH nml.   Labs 02/2022: CBC and CMP unremarkable.   Labs 06/2022: CBC and CMP unremarkable  Labs 12/2022: CBC and CMP unremarkable.  SIBO breath test (collected 05/22/23), which was positive for SIBO. Increase in hydrogen was 26 ppm (nml<20) and increase in methane was 3 ppm (nml<12).   KUB 12/11/21: IMPRESSION: Bowel-gas pattern is unremarkable. No radiographic evidence of constipation. Moderate thoracolumbar spondylosis. No acute cardiopulmonary disease.  CT A/P w/contrast 03/06/22: IMPRESSION:  1. No acute findings   Complete U/S 07/21/19: Normal abdominal ultrasound. Cholecystectomy.  CT A/P w/contrast 05/13/23: IMPRESSION: Left colonic diverticulosis.   No active diverticulitis. Aortic atherosclerosis. No acute findings in the abdomen or pelvis.  EGD 08/03/2019: Normal esophagus.  3 cm hiatal hernia.  2 adjacent 8 mm semi-pedunculated gastric  polyps that were resected with hot snare.  Stomach biopsies were obtained.  Normal duodenum.  Path: B.  STOMACH POLYP, ANTRUM, POLYPECTOMY:       Polypoid fragments of antral type gastric mucosa with  reactive gastropathy and chronic active gastritis with focal  intestinal metaplasia.       No H. pylori identified on H&E stained slide or  immunohistochemistry.      No dysplasia or malignancy identified.       See Comment.  COMMENT:  An immunohistochemical stain for Helicobacter pylori was  performed on block B1, and is negative for organisms.  The positive immunohistochemical control worked appropriately.   Colonoscopy 08/03/2019: 7 mm semipedunculated polyp in the mid ascending colon that was resected with cold snare.  Diverticulosis in the left colon.  Small internal hemorrhoids.  Repeat colonoscopy recommended in 5 years. Path: A.  COLON POLYP, ASCENDING, POLYPECTOMY:       Tubular adenoma.   Colonoscopy 06/03/23: - The examined portion of the ileum was normal. - Four 3 to 8 mm polyps in the transverse colon and in the ascending colon, removed with a cold snare. Resected and retrieved. - Diverticulosis in the sigmoid colon and in the descending colon. - Non- bleeding internal hemorrhoids. Path:      1. Surgical [P], colon, transverse and ascending, polyp (4) :       - TUBULAR ADENOMA(S)       - NEGATIVE FOR HIGH-GRADE DYSPLASIA OR MALIGNANCY   ASSESSMENT AND PLAN: Constipation Fecal incontinence, suspected overflow incontinence Bloating Nausea Gastric intestinal metaplasia Lower abdominal discomfort History of SIBO History of cholecystectomy Overall patient seems to be doing better in terms of her constipation.  She recently had a colonoscopy.  After her colonoscopy constipation seems to  be better.  She has not had to use any enemas.  She is drinking Miralax once a day with her morning.  She recently saw her urogynecologist Dr. Florian Buff who recommended that she undergo pelvic floor physical therapy.  However patient is hesitant to do so because she does not want to worsen her back pain.  I asked her to go ahead and continue her MiraLAX try IBgard to see if this helps with some of her residual abdominal discomfort.  Patient does have a history of gastric intestinal metaplasia.  I went over the risk and benefits of continued surveillance of her GIM, and she would like to pursue another EGD procedure.  Will plan to perform gastric mapping at that time, and if patient's GIM is focal, could consider discontinuing future surveillance.  Patient did have significant benefit in her GI symptoms on rifaximin therapy - Continue Miralax - Will give samples of IBGard - EGD LEC for follow up of GIM - Next colonoscopy is due in 05/2026 for polyp surveillance  Eulah Pont, MD  I spent 43 minutes of time, including in depth chart review, independent review of results as outlined above, communicating results with the patient directly, face-to-face time with the patient, coordinating care, ordering studies and medications as appropriate, and documentation.

## 2023-09-09 NOTE — Patient Instructions (Addendum)
 You have been scheduled for an endoscopy. Please follow written instructions given to you at your visit today.  If you use inhalers (even only as needed), please bring them with you on the day of your procedure.  If you take any of the following medications, they will need to be adjusted prior to your procedure:   DO NOT TAKE 7 DAYS PRIOR TO TEST- Trulicity (dulaglutide) Ozempic, Wegovy (semaglutide) Mounjaro (tirzepatide) Bydureon Bcise (exanatide extended release)  DO NOT TAKE 1 DAY PRIOR TO YOUR TEST Rybelsus (semaglutide) Adlyxin (lixisenatide) Victoza (liraglutide) Byetta (exanatide) ___________________________________________________________________________    If your blood pressure at your visit was 140/90 or greater, please contact your primary care physician to follow up on this.  _______________________________________________________  If you are age 3 or older, your body mass index should be between 23-30. Your Body mass index is 27.99 kg/m. If this is out of the aforementioned range listed, please consider follow up with your Primary Care Provider.  If you are age 96 or younger, your body mass index should be between 19-25. Your Body mass index is 27.99 kg/m. If this is out of the aformentioned range listed, please consider follow up with your Primary Care Provider.   ________________________________________________________  The [facility] providers would like to encourage you to use [facility] to communicate with providers for non-urgent requests or questions.  Due to long hold times on the telephone, sending your provider a message by [facility] may be a faster and more efficient way to get a response.  Please allow 48 business hours for a response.  Please remember that this is for non-urgent requests.  _______________________________________________________  Thank you for entrusting me with your care and for choosing [facility],  Dr. Eulah Pont

## 2023-09-15 NOTE — Addendum Note (Signed)
 Addended by: Dennison Mascot on: 09/15/2023 04:45 PM   Modules accepted: Orders

## 2023-09-30 ENCOUNTER — Encounter: Payer: Self-pay | Admitting: Internal Medicine

## 2023-09-30 ENCOUNTER — Ambulatory Visit: Admitting: Internal Medicine

## 2023-09-30 VITALS — BP 158/71 | HR 66 | Temp 98.2°F | Resp 12 | Ht 63.0 in | Wt 158.0 lb

## 2023-09-30 DIAGNOSIS — K31A12 Gastric intestinal metaplasia without dysplasia, involving the body (corpus): Secondary | ICD-10-CM | POA: Diagnosis not present

## 2023-09-30 DIAGNOSIS — K317 Polyp of stomach and duodenum: Secondary | ICD-10-CM | POA: Diagnosis not present

## 2023-09-30 DIAGNOSIS — K31A0 Gastric intestinal metaplasia, unspecified: Secondary | ICD-10-CM

## 2023-09-30 DIAGNOSIS — K3189 Other diseases of stomach and duodenum: Secondary | ICD-10-CM | POA: Diagnosis not present

## 2023-09-30 DIAGNOSIS — K449 Diaphragmatic hernia without obstruction or gangrene: Secondary | ICD-10-CM | POA: Diagnosis not present

## 2023-09-30 DIAGNOSIS — K2950 Unspecified chronic gastritis without bleeding: Secondary | ICD-10-CM

## 2023-09-30 MED ORDER — PANTOPRAZOLE SODIUM 40 MG PO TBEC: 40.0000 mg | DELAYED_RELEASE_TABLET | Freq: Every day | ORAL | 1 refills | Status: DC

## 2023-09-30 MED ORDER — SODIUM CHLORIDE 0.9 % IV SOLN: 500.0000 mL | Freq: Once | INTRAVENOUS | Status: AC

## 2023-09-30 NOTE — Progress Notes (Signed)
 Sedate, gd SR, tolerated procedure well, VSS, report to RN

## 2023-09-30 NOTE — Patient Instructions (Signed)
 Resume previous diet and medications. Awaiting pathology results. Use Pantoprazole 40 mg PO daily x 8 weeks.  YOU HAD AN ENDOSCOPIC PROCEDURE TODAY AT THE [facility]:   Refer to the procedure report that was given to you for any specific questions about what was found during the examination.  If the procedure report does not answer your questions, please call your gastroenterologist to clarify.  If you requested that your care partner not be given the details of your procedure findings, then the procedure report has been included in a sealed envelope for you to review at your convenience later.  YOU SHOULD EXPECT: Some feelings of bloating in the abdomen. Passage of more gas than usual.  Walking can help get rid of the air that was put into your GI tract during the procedure and reduce the bloating. If you had a lower endoscopy (such as a colonoscopy or flexible sigmoidoscopy) you may notice spotting of blood in your stool or on the toilet paper. If you underwent a bowel prep for your procedure, you may not have a normal bowel movement for a few days.  Please Note:  You might notice some irritation and congestion in your nose or some drainage.  This is from the oxygen used during your procedure.  There is no need for concern and it should clear up in a day or so.  SYMPTOMS TO REPORT IMMEDIATELY:  Following upper endoscopy (EGD)  Vomiting of blood or coffee ground material  New chest pain or pain under the shoulder blades  Painful or persistently difficult swallowing  New shortness of breath  Fever of 100F or higher  Black, tarry-looking stools  For urgent or emergent issues, a gastroenterologist can be reached at any hour by calling [phone]. Do not use MyChart messaging for urgent concerns.    DIET:  We do recommend a small meal at first, but then you may proceed to your regular diet.  Drink plenty of fluids but you should avoid alcoholic beverages for 24  hours.  ACTIVITY:  You should plan to take it easy for the rest of today and you should NOT DRIVE or use heavy machinery until tomorrow (because of the sedation medicines used during the test).    FOLLOW UP: Our staff will call the number listed on your records the next business day following your procedure.  We will call around 7:15- 8:00 am to check on you and address any questions or concerns that you may have regarding the information given to you following your procedure. If we do not reach you, we will leave a message.     If any biopsies were taken you will be contacted by phone or by letter within the next 1-3 weeks.  Please call us at [phone] if you have not heard about the biopsies in 3 weeks.    SIGNATURES/CONFIDENTIALITY: You and/or your care partner have signed paperwork which will be entered into your electronic medical record.  These signatures attest to the fact that that the information above on your After Visit Summary has been reviewed and is understood.  Full responsibility of the confidentiality of this discharge information lies with you and/or your care-partner.

## 2023-09-30 NOTE — Progress Notes (Signed)
 Called to room to assist during endoscopic procedure.  Patient ID and intended procedure confirmed with present staff. Received instructions for my participation in the procedure from the performing physician.

## 2023-09-30 NOTE — Progress Notes (Signed)
 GASTROENTEROLOGY PROCEDURE H&P NOTE   Primary Care Physician: Noberto Retort, MD    Reason for Procedure:   Gastric intestinal metaplasia  Plan:    EGD  Patient is appropriate for endoscopic procedure(s) in the ambulatory ([facility]) setting.  The nature of the procedure, as well as the risks, benefits, and alternatives were carefully and thoroughly reviewed with the patient. Ample time for discussion and questions allowed. The patient understood, was satisfied, and agreed to proceed.     HPI: Laura [name] is a 78 y.o. female who presents for EGD for evaluation of gastric intestinal metaplasia.  Patient was most recently seen in the Gastroenterology Clinic on 09/09/23.  No interval change in medical history since that appointment. Please refer to that note for full details regarding GI history and clinical presentation.   Past Medical History:  Diagnosis Date   Anemia    Anxiety    Asthma    related to sesonal allergies   Cancer (HCC)    Lung   Cataract    Chronic combined systolic and diastolic CHF, NYHA class 2 (HCC) CARDIOLOGIST-  DR [id number]   Coronary artery disease    Depression    History of colon polyps 07/2019   tubular adenoma - Dr. Kinnie Scales   History of kidney stones    History of non-ST elevation myocardial infarction (NSTEMI) 11/27/2009   SECONDARY TO TAKOTSUDO SYNDROME (CARDIAC CATH NORMAL)   Hyperlipemia    Hypertension    Hypoglycemia    Hypothyroidism    LBBB (left bundle branch block)    Left ventricular ejection fraction less than 40%    38% PER CARDIOLOGIST NOTE (DR CROITORU)   Memory loss    Mood swings    Myocardial infarction (HCC) 2011   Nonischemic dilated cardiomyopathy (HCC)    MODERATELY DEPRESSED LVF;EF 35-45% by Echo 05/27/11   OSA (obstructive sleep apnea) MODERATE PER STUDY 2005   CPAP NONCOMPLIANT   Pre-diabetes    Pulmonary embolism (HCC)    after the lobectomy   Seasonal allergies    Sleep apnea    SUI (stress urinary  incontinence, female)     Past Surgical History:  Procedure Laterality Date   CARDIAC CATHETERIZATION  09-04-1999;  08/25/2004;   12/09/2009  DR Royann Shivers   NORMAL CORONARIES/  APICAL BALLOONING OF LV CONSISTENT WITH TAKOTSUBO SYMPTOMS/ EF 30-35%   CATARACT EXTRACTION W/ INTRAOCULAR LENS  IMPLANT, BILATERAL     CHOLECYSTECTOMY N/A 09/29/2012   Procedure: LAPAROSCOPIC CHOLECYSTECTOMY WITH INTRAOPERATIVE CHOLANGIOGRAM;  Surgeon: Ardeth Sportsman, MD;  Location: [facility] OR;  Service: General;  Laterality: N/A;   COLONOSCOPY  07/2020   CYSTOSCOPY N/A 09/19/2012   Procedure: Derinda Late;  Surgeon: Valetta Fuller, MD;  Location: [facility];  Service: Urology;  Laterality: N/A;   DILATION AND CURETTAGE OF UTERUS     EYE SURGERY Bilateral    cataract removal   INTERCOSTAL NERVE BLOCK  05/30/2021   Procedure: INTERCOSTAL NERVE BLOCK;  Surgeon: Loreli Slot, MD;  Location: [facility] OR;  Service: Thoracic;;   KNEE ARTHROSCOPY W/ MENISCECTOMY Right 07/27/2011   MEDIAL AND LATERAL   LOBECTOMY  05/30/2021   Procedure: LEFT LOWER LOBECTOMY;  Surgeon: Loreli Slot, MD;  Location: [facility] OR;  Service: Thoracic;;   LYMPH NODE DISSECTION  05/30/2021   Procedure: LYMPH NODE DISSECTION;  Surgeon: Loreli Slot, MD;  Location: [facility] OR;  Service: Thoracic;;   NASAL SEPTUM SURGERY  02/28/1979   PUBOVAGINAL SLING N/A 09/19/2012  Procedure: SUBURETHRAL Elio Forget;  Surgeon: Valetta Fuller, MD;  Location: [facility];  Service: Urology;  Laterality: N/A;   RIGHT URETEROSCOPIC STONE EXTRACTION  08/31/2000   TRANSTHORACIC ECHOCARDIOGRAM  05-27-2011  DR CROITORU   MODERATELY DEPRESSED LVF DUE TO GLOBAL HYPOKINESIS AND MARKED SYSTOLIC ASYNCHRONY/ EF 38%/ MILD LEFT ATRIAL DILATATION   VAGINAL HYSTERECTOMY  06/30/1983   partial    Prior to Admission medications   Medication Sig Start Date End Date Taking? Authorizing Provider  atorvastatin (LIPITOR) 80 MG tablet TAKE  1/2 TABLET BY MOUTH DAILY FOR CHOLESTEROL 04/24/15  Yes Croitoru, Mihai, MD  diazepam (VALIUM) 10 MG tablet Take 10 mg by mouth every 6 (six) hours as needed for anxiety. 09/27/19  Yes [provider]  hydrALAZINE (APRESOLINE) 50 MG tablet TAKE 1 TABLET BY MOUTH TWICE A DAY Patient taking differently: Take 75 mg by mouth 2 (two) times daily. 08/31/23  Yes Lennette Bihari, MD  irbesartan (AVAPRO) 300 MG tablet Take 300 mg by mouth daily.   Yes [provider]  levothyroxine (SYNTHROID, LEVOTHROID) 88 MCG tablet Take 88 mcg by mouth daily before breakfast.   Yes [provider]  Melatonin 10 MG TABS Take by mouth at bedtime as needed. Pt taking 21/2 mg a day   Yes [provider]  metoprolol succinate (TOPROL-XL) 50 MG 24 hr tablet TAKE ONE AND ONE-HALF TABLETS BY MOUTH DAILY Patient taking differently: Pt taking 75 mg a day 12/31/20  Yes Lennette Bihari, MD  cyclobenzaprine (FLEXERIL) 5 MG tablet Take 1 tablet (5 mg total) by mouth 3 (three) times daily as needed for muscle spasms. Patient not taking: Reported on 09/30/2023 08/04/23   Marguerita Beards, MD  NUVIGIL 250 MG tablet Take 250 mg by mouth daily as needed (energy boost). 06/14/15   [provider]  polyethylene glycol (MIRALAX) 17 g packet Take 17 g by mouth 3 (three) times daily. 05/01/23   Imogene Burn, MD    Current Outpatient Medications  Medication Sig Dispense Refill   atorvastatin (LIPITOR) 80 MG tablet TAKE 1/2 TABLET BY MOUTH DAILY FOR CHOLESTEROL 45 tablet 3   diazepam (VALIUM) 10 MG tablet Take 10 mg by mouth every 6 (six) hours as needed for anxiety.     hydrALAZINE (APRESOLINE) 50 MG tablet TAKE 1 TABLET BY MOUTH TWICE A DAY (Patient taking differently: Take 75 mg by mouth 2 (two) times daily.) 180 tablet 2   irbesartan (AVAPRO) 300 MG tablet Take 300 mg by mouth daily.     levothyroxine (SYNTHROID, LEVOTHROID) 88 MCG tablet Take 88 mcg by mouth daily before breakfast.     Melatonin  10 MG TABS Take by mouth at bedtime as needed. Pt taking 21/2 mg a day     metoprolol succinate (TOPROL-XL) 50 MG 24 hr tablet TAKE ONE AND ONE-HALF TABLETS BY MOUTH DAILY (Patient taking differently: Pt taking 75 mg a day) 90 tablet 3   cyclobenzaprine (FLEXERIL) 5 MG tablet Take 1 tablet (5 mg total) by mouth 3 (three) times daily as needed for muscle spasms. (Patient not taking: Reported on 09/30/2023) 60 tablet 5   NUVIGIL 250 MG tablet Take 250 mg by mouth daily as needed (energy boost).  3   polyethylene glycol (MIRALAX) 17 g packet Take 17 g by mouth 3 (three) times daily.     Current Facility-Administered Medications  Medication Dose Route Frequency Provider Last Rate Last Admin   0.9 %  sodium chloride infusion  500 mL Intravenous Once Imogene Burn, MD        Allergies as of 09/30/2023 - Review Complete 09/30/2023  Allergen Reaction Noted   Codeine Anaphylaxis, Hives, and Other (See Comments) 06/18/2011   Ciprofloxacin Nausea And Vomiting 01/28/2021   Ace inhibitors Other (See Comments) 09/14/2012   Citalopram Other (See Comments) 06/18/2013   Fetzima [levomilnacipran] Other (See Comments) 09/16/2012   Lasix [furosemide] Other (See Comments) 09/12/2012   Linzess [linaclotide] Nausea Only 04/30/2023   Nsaids Other (See Comments) 12/16/2012   Sulfamethoxazole-trimethoprim Other (See Comments) 12/31/2020   Xanax xr Gerald Leitz er] Other (See Comments) 07/28/2013    Family History  Problem Relation Age of Onset   Pneumonia Mother    Hypertension Mother    Heart attack Father    Fibromyalgia Brother    Pulmonary embolism Brother    Hypertension Brother    Heart disease Brother    Heart attack Paternal Grandfather    Breast cancer Neg Hx    Colon cancer Neg Hx    Esophageal cancer Neg Hx    Rectal cancer Neg Hx    Stomach cancer Neg Hx     Social History   Socioeconomic History   Marital status: Married    Spouse name: Laura [name]   Number of children: 2   Years of  education: 12+   Highest education level: Some college, no degree  Occupational History   Occupation: Retired   Occupation: retired  Tobacco Use   Smoking status: Never   Smokeless tobacco: Never  Vaping Use   Vaping status: Never Used  Substance and Sexual Activity   Alcohol use: No    Alcohol/week: 0.0 standard drinks of alcohol   Drug use: No   Sexual activity: Not Currently    Birth control/protection: Post-menopausal  Other Topics Concern   Not on file  Social History Narrative   Lives at home with husband.   Right-handed.   Drinks 2-3 cups caffeine per day.   No regular exercise.   Social Drivers of Corporate investment banker Strain: Low Risk  (09/28/2022)   Received from [facility]   Overall Financial Resource Strain (CARDIA)    Difficulty of Paying Living Expenses: Not very hard  Food Insecurity: No Food Insecurity (09/28/2022)   Received from [facility]   Hunger Vital Sign    Worried About Running Out of Food in the Last Year: Never true    Ran Out of Food in the Last Year: Never true  Transportation Needs: No Transportation Needs (09/28/2022)   Received from [facility] - Transportation    Lack of Transportation (Medical): No    Lack of Transportation (Non-Medical): No  Physical Activity: Not on file  Stress: No Stress Concern Present (02/05/2022)   Received from [facility] - Occupational Stress Questionnaire    Feeling of Stress : Not at all  Social Connections: Unknown (10/27/2021)   Received from [facility]   Social Network    Social Network: Not on file  Intimate Partner Violence: Not At Risk (01/22/2023)   Received from [facility]   HITS    Over the last 12 months how often did your partner physically hurt you?: Never    Over the last 12 months how often did your partner insult you or talk down to you?: Never    Over the last 12 months how often did your  partner threaten  you with physical harm?: Never    Over the last 12 months how often did your partner scream or curse at you?: Never    Physical Exam: Vital signs in last 24 hours: BP (!) 160/94   Pulse 93   Temp 98.2 F (36.8 C) (Temporal)   Ht 5\' 3"  (1.6 m)   Wt 158 lb (71.7 kg)   SpO2 97%   BMI 27.99 kg/m  GEN: NAD EYE: Sclerae anicteric ENT: MMM CV: Non-tachycardic Pulm: No increased WOB GI: Soft NEURO:  Alert & Oriented   Eulah Pont, MD [facility]   09/30/2023 2:27 PM

## 2023-09-30 NOTE — Op Note (Signed)
 [facility] Patient Name: Laura [name] Procedure Date: 09/30/2023 2:39 PM MRN: [id number] Endoscopist: Madelyn Brunner [place] , , [id number] Age: 78 Referring MD:  Date of Birth: Nov 14, 1945 Gender: Female Account #: [id number] Procedure:                Upper GI endoscopy Indications:              Follow-up of gastric intestinal metaplasia Medicines:                Monitored Anesthesia Care Procedure:                Pre-Anesthesia Assessment:                           - Prior to the procedure, a History and Physical                            was performed, and patient medications and                            allergies were reviewed. The patient's tolerance of                            previous anesthesia was also reviewed. The risks                            and benefits of the procedure and the sedation                            options and risks were discussed with the patient.                            All questions were answered, and informed consent                            was obtained. Prior Anticoagulants: The patient has                            taken no anticoagulant or antiplatelet agents. ASA                            Grade Assessment: III - A patient with severe                            systemic disease. After reviewing the risks and                            benefits, the patient was deemed in satisfactory                            condition to undergo the procedure.                           After obtaining informed consent, the endoscope was  passed under direct vision. Throughout the                            procedure, the patient's blood pressure, pulse, and                            oxygen saturations were monitored continuously. The                            Olympus Scope [id number] was introduced through the                            mouth, and advanced to the second part of duodenum.                             The upper GI endoscopy was accomplished without                            difficulty. The patient tolerated the procedure                            well. Scope In: Scope Out: Findings:                 The examined esophagus was normal.                           A small hiatal hernia was present.                           Localized inflammation characterized by congestion                            (edema), erosions and erythema was found in the                            gastric antrum. Biopsies were taken with a cold                            forceps for histology.                           A single 14 mm sessile polyp with no bleeding and                            no stigmata of recent bleeding was found in the                            gastric antrum. The polyp was removed with a cold                            snare. Resection and retrieval were complete.                           Biopsies were taken with a  cold forceps on the                            greater curvature of the gastric body, on the                            lesser curvature of the gastric body, at the                            incisura, on the greater curvature of the gastric                            antrum and on the lesser curvature of the gastric                            antrum for histology. Complications:            No immediate complications. Estimated Blood Loss:     Estimated blood loss was minimal. Impression:               - Normal esophagus.                           - Small hiatal hernia.                           - Gastritis. Biopsied.                           - A single gastric polyp. Resected and retrieved.                           - Biopsies were taken with a cold forceps for                            histology on the greater curvature of the gastric                            body, on the lesser curvature of the gastric body,                            at the incisura, on the greater  curvature of the                            gastric antrum and on the lesser curvature of the                            gastric antrum. Recommendation:           - Discharge patient to home (with escort).                           - Await pathology results.                           - Use Protonix (pantoprazole) 40  mg PO daily for 8                            weeks.                           - The findings and recommendations were discussed                            with the patient. Dr Particia Lather [address] [place],  09/30/2023 3:08:28 PM

## 2023-09-30 NOTE — Progress Notes (Signed)
 Pt's states no medical or surgical changes since previsit or office visit.

## 2023-10-01 ENCOUNTER — Telehealth: Payer: Self-pay | Admitting: *Deleted

## 2023-10-01 NOTE — Telephone Encounter (Signed)
  Follow up Call-     09/30/2023    2:07 PM 06/03/2023    9:02 AM  Call back number  Post procedure Call Back phone  # [phone]  Permission to leave phone message Yes Yes     Patient questions:  Do you have a fever, pain , or abdominal swelling? No. Pain Score  0 *  Have you tolerated food without any problems? Yes.    Have you been able to return to your normal activities? Yes.    Do you have any questions about your discharge instructions: Diet   No. Medications  No. Follow up visit  No.  Do you have questions or concerns about your Care? No.  Actions: * If pain score is 4 or above: No action needed, pain <4.

## 2023-10-06 ENCOUNTER — Encounter: Payer: Self-pay | Admitting: Internal Medicine

## 2023-10-06 ENCOUNTER — Ambulatory Visit: Payer: Medicare Other | Admitting: Obstetrics and Gynecology

## 2023-10-06 LAB — SURGICAL PATHOLOGY

## 2023-10-11 ENCOUNTER — Ambulatory Visit: Admitting: Obstetrics and Gynecology

## 2023-10-22 ENCOUNTER — Telehealth: Payer: Self-pay | Admitting: Internal Medicine

## 2023-10-22 NOTE — Telephone Encounter (Signed)
 Patient called and stated that she would like to know if Dr. Rosaline Coma is wanting her to come in for a follow up due to her not really understanding what the results of her Biopsy was. Patient also stated that she would also like to what is her next's steps after having a EGD and colon. Patient is requesting a call back. Please advise.

## 2023-10-22 NOTE — Telephone Encounter (Signed)
 Returned patient call & we discussed path letter that was sent to her and next steps. She is currently taking pantoprazole  40 mg daily & miralax  daily. She is still having ongoing nausea, bloating, and some gas. She has a bm daily. Advised her that she could try Ibgard OTC as advised at time of OV & if symptoms do not improve then to let us  know. She's currently scheduled for a f/u in July with Dr. Rosaline Coma. Pt verbalized all understanding.

## 2023-12-01 ENCOUNTER — Ambulatory Visit (HOSPITAL_COMMUNITY)
Admission: RE | Admit: 2023-12-01 | Discharge: 2023-12-01 | Disposition: A | Discharge status: Home / Self Care | Payer: Medicare Other | Source: Ambulatory Visit | Attending: Cardiology | Admitting: Cardiology

## 2023-12-01 DIAGNOSIS — I447 Left bundle-branch block, unspecified: Secondary | ICD-10-CM

## 2023-12-01 DIAGNOSIS — I5181 Takotsubo syndrome: Secondary | ICD-10-CM

## 2023-12-01 LAB — ECHOCARDIOGRAM COMPLETE
AR max vel: 2.35 cm2
AV Area VTI: 2.48 cm2
AV Area mean vel: 2.28 cm2
AV Mean grad: 2 mmHg
AV Peak grad: 4.3 mmHg
Ao pk vel: 1.04 m/s
Area-P 1/2: 3.42 cm2
S' Lateral: 4.22 cm

## 2023-12-03 ENCOUNTER — Ambulatory Visit: Payer: Self-pay | Admitting: Cardiovascular Disease

## 2023-12-06 ENCOUNTER — Ambulatory Visit (INDEPENDENT_AMBULATORY_CARE_PROVIDER_SITE_OTHER): Admitting: Obstetrics and Gynecology

## 2023-12-06 VITALS — BP 147/88 | HR 101

## 2023-12-06 DIAGNOSIS — N812 Incomplete uterovaginal prolapse: Secondary | ICD-10-CM | POA: Diagnosis not present

## 2023-12-06 DIAGNOSIS — M545 Low back pain, unspecified: Secondary | ICD-10-CM | POA: Diagnosis not present

## 2023-12-06 DIAGNOSIS — R339 Retention of urine, unspecified: Secondary | ICD-10-CM

## 2023-12-06 NOTE — Progress Notes (Unsigned)
 [facility]   Subjective:      Chief Complaint:  Chief Complaint  Patient presents with   Follow-up    ,Laura [name] is a 78 y.o. female is here for pessary cleaning and med follow up.   History of Present Illness: Laura [name] is a 78 y.o. female with stage II pelvic organ prolapse who presents for a pessary check. She is using a size *** {pessary type:24772} pessary. The pessary has been working well and she has no complaints. She {ACTION; IS/IS ZOX:09604540} using vaginal estrogen. She denies vaginal bleeding.  She did not try the cyclobenzaprine  (did not pick up the prescription). She reports a lot of pain in her back more recently. She has been seeing a chiropractor and has been getting tens unit and laser treatment. She has seen her PCP recently but did not mention her back pain. Reports she has had 6 different spinal injections previously for her spine- occurred about 2 years ago.   She also brought a booklet of pelvic floor exercises. She did not choose to do pelvic PT because she has been having so much back pain.    Past Medical History: Patient  has a past medical history of Anemia, Anxiety, Asthma, Cancer (HCC), Cataract, Chronic combined systolic and diastolic CHF, NYHA class 2 (HCC) (CARDIOLOGIST-  DR [id number]), Coronary artery disease, Depression, History of colon polyps (07/2019), History of kidney stones, History of non-ST elevation myocardial infarction (NSTEMI) (11/27/2009), Hyperlipemia, Hypertension, Hypoglycemia, Hypothyroidism, LBBB (left bundle branch block), Left ventricular ejection fraction less than 40%, Memory loss, Mood swings, Myocardial infarction (HCC) (2011), Nonischemic dilated cardiomyopathy (HCC), OSA (obstructive sleep apnea) (MODERATE PER STUDY 2005), Pre-diabetes, Pulmonary embolism (HCC), Seasonal allergies, Sleep apnea, and SUI (stress urinary incontinence, female).   Past Surgical History: She  has a past surgical history that  includes Nasal septum surgery (02/28/1979); Vaginal hysterectomy (06/30/1983); Cataract extraction w/ intraocular lens  implant, bilateral; RIGHT URETEROSCOPIC STONE EXTRACTION (08/31/2000); Knee arthroscopy w/ meniscectomy (Right, 07/27/2011); transthoracic echocardiogram (05-27-2011  DR CROITORU); Cardiac catheterization (09-04-1999;  08/25/2004;   12/09/2009  DR CROITORU); Pubovaginal sling (N/A, 09/19/2012); Cystoscopy (N/A, 09/19/2012); Cholecystectomy (N/A, 09/29/2012); Colonoscopy (07/2020); Eye surgery (Bilateral); Dilation and curettage of uterus; Lobectomy (05/30/2021); Intercostal nerve block (05/30/2021); and Lymph node dissection (05/30/2021).   Medications: She has a current medication list which includes the following prescription(s): atorvastatin , cyclobenzaprine , diazepam , hydralazine , irbesartan , levothyroxine , melatonin, metoprolol  succinate, nuvigil , pantoprazole , and polyethylene glycol, and the following Facility-Administered Medications: sodium chloride .   Allergies: Patient is allergic to codeine, ciprofloxacin, ace inhibitors, citalopram , fetzima [levomilnacipran], lasix [furosemide], linzess [linaclotide], nsaids, sulfamethoxazole-trimethoprim, and xanax xr [alprazolam er].   Social History: Patient  reports that she has never smoked. She has never used smokeless tobacco. She reports that she does not drink alcohol and does not use drugs.      Objective:     Physical Exam: There were no vitals taken for this visit. Gen: No apparent distress, A&O x 3. Detailed Urogynecologic Evaluation:  Pelvic Exam: Normal external female genitalia; Bartholin's and Skene's glands normal in appearance; urethral meatus {urethra:24773}, no urethral masses or discharge. The pessary was noted to be {in place:24774}. It was removed and cleaned. Speculum exam revealed {vaginal lesions:24775} in the vagina. The pessary was replaced. It was comfortable to the patient and fit well.       No data  to display          Laboratory Results: Urine dipstick shows: {ua dip:315374::"negative for all components"}.  Assessment/Plan:    Assessment: Laura [name] is a 78 y.o. with {PFD symptoms:24771} here for a pessary check. She is doing well.  Plan: She will {pessary plan:24776}. She will continue to use {lubricant:24777}. She will follow-up in *** {days/wks/mos/yrs:310907} for a pessary check or sooner as needed.  All questions were answered.   Time Spent:

## 2023-12-06 NOTE — Patient Instructions (Signed)
 Can pick up cyclobenzaprine  muscle relaxant to help relax the muscles.

## 2023-12-07 ENCOUNTER — Encounter: Payer: Self-pay | Admitting: Obstetrics and Gynecology

## 2023-12-08 ENCOUNTER — Ambulatory Visit

## 2023-12-08 ENCOUNTER — Encounter: Payer: Self-pay | Admitting: Podiatry

## 2023-12-08 ENCOUNTER — Ambulatory Visit (INDEPENDENT_AMBULATORY_CARE_PROVIDER_SITE_OTHER): Admitting: Podiatry

## 2023-12-08 DIAGNOSIS — M778 Other enthesopathies, not elsewhere classified: Secondary | ICD-10-CM | POA: Diagnosis not present

## 2023-12-08 DIAGNOSIS — M5416 Radiculopathy, lumbar region: Secondary | ICD-10-CM

## 2023-12-08 NOTE — Progress Notes (Signed)
 Chief Complaint  Patient presents with   Foot Pain    Pt is here due to bilateral foot pain states this has been going on for 4 years when she walks barefooted it feels like there is paper stuck to the bottom of her foot, and when she has shoes on it feels like cotton balls are stuffed into her shoe, was told by PCP that she has arthritis in her feet, states she has gait issues due to this.    HPI: 78 y.o. female presenting today as a new patient for evaluation of abnormal sensation to the bilateral feet.  Onset about 3-4 years ago.  At the same time she also began to develop lower back pain and had multiple injections which did not alleviate her symptoms.  She says that she has a long history of back pathology and degenerative disc disease to her lumbar spine.  Past Medical History:  Diagnosis Date   Anemia    Anxiety    Asthma    related to sesonal allergies   Cancer (HCC)    Lung   Cataract    Chronic combined systolic and diastolic CHF, NYHA class 2 (HCC) CARDIOLOGIST-  DR QMVHQION   Coronary artery disease    Depression    History of colon polyps 07/2019   tubular adenoma - Dr. Andriette Keeling   History of kidney stones    History of non-ST elevation myocardial infarction (NSTEMI) 11/27/2009   SECONDARY TO TAKOTSUDO SYNDROME (CARDIAC CATH NORMAL)   Hyperlipemia    Hypertension    Hypoglycemia    Hypothyroidism    LBBB (left bundle branch block)    Left ventricular ejection fraction less than 40%    38% PER CARDIOLOGIST NOTE (DR CROITORU)   Memory loss    Mood swings    Myocardial infarction (HCC) 2011   Nonischemic dilated cardiomyopathy (HCC)    MODERATELY DEPRESSED LVF;EF 35-45% by Echo 05/27/11   OSA (obstructive sleep apnea) MODERATE PER STUDY 2005   CPAP NONCOMPLIANT   Pre-diabetes    Pulmonary embolism (HCC)    after the lobectomy   Seasonal allergies    Sleep apnea    SUI (stress urinary incontinence, female)     Past Surgical History:  Procedure Laterality Date    CARDIAC CATHETERIZATION  09-04-1999;  08/25/2004;   12/09/2009  DR Alvis Ba   NORMAL CORONARIES/  APICAL BALLOONING OF LV CONSISTENT WITH TAKOTSUBO SYMPTOMS/ EF 30-35%   CATARACT EXTRACTION W/ INTRAOCULAR LENS  IMPLANT, BILATERAL     CHOLECYSTECTOMY N/A 09/29/2012   Procedure: LAPAROSCOPIC CHOLECYSTECTOMY WITH INTRAOPERATIVE CHOLANGIOGRAM;  Surgeon: Eddye Goodie, MD;  Location: [facility] OR;  Service: General;  Laterality: N/A;   COLONOSCOPY  07/2020   CYSTOSCOPY N/A 09/19/2012   Procedure: Ardith Bedford;  Surgeon: Livingston Rigg, MD;  Location: [facility];  Service: Urology;  Laterality: N/A;   DILATION AND CURETTAGE OF UTERUS     EYE SURGERY Bilateral    cataract removal   INTERCOSTAL NERVE BLOCK  05/30/2021   Procedure: INTERCOSTAL NERVE BLOCK;  Surgeon: Zelphia Higashi, MD;  Location: [facility] OR;  Service: Thoracic;;   KNEE ARTHROSCOPY W/ MENISCECTOMY Right 07/27/2011   MEDIAL AND LATERAL   LOBECTOMY  05/30/2021   Procedure: LEFT LOWER LOBECTOMY;  Surgeon: Zelphia Higashi, MD;  Location: [facility] OR;  Service: Thoracic;;   LYMPH NODE DISSECTION  05/30/2021   Procedure: LYMPH NODE DISSECTION;  Surgeon: Zelphia Higashi, MD;  Location: [facility] OR;  Service: Thoracic;;  NASAL SEPTUM SURGERY  02/28/1979   PUBOVAGINAL SLING N/A 09/19/2012   Procedure: SUBURETHRAL Lavone Power;  Surgeon: Livingston Rigg, MD;  Location: [facility];  Service: Urology;  Laterality: N/A;   RIGHT URETEROSCOPIC STONE EXTRACTION  08/31/2000   TRANSTHORACIC ECHOCARDIOGRAM  05-27-2011  DR CROITORU   MODERATELY DEPRESSED LVF DUE TO GLOBAL HYPOKINESIS AND MARKED SYSTOLIC ASYNCHRONY/ EF 38%/ MILD LEFT ATRIAL DILATATION   VAGINAL HYSTERECTOMY  06/30/1983   partial    Allergies  Allergen Reactions   Codeine Anaphylaxis, Hives and Other (See Comments)    Headache. Daughter reported that it caused her throat to swell up    Ciprofloxacin Nausea And Vomiting   Ace Inhibitors Other (See  Comments)    Unknown- it didn't agree with her    Citalopram  Other (See Comments)    Fatigue   Fetzima [Levomilnacipran] Other (See Comments)    Talking out of my head   Lasix [Furosemide] Other (See Comments)    HEADACHE   Linzess [Linaclotide] Nausea Only   Nsaids Other (See Comments)    Told not to take NSAIDs because of her heart    Sulfamethoxazole-Trimethoprim Other (See Comments)    NERVOUS AND DISORENTED   Xanax Lucky Sable Er] Other (See Comments)    confusion     Physical Exam: General: The patient is alert and oriented x3 in no acute distress.  Dermatology: Skin is warm, dry and supple bilateral lower extremities.   Vascular: Palpable pedal pulses bilaterally. Capillary refill within normal limits.  No appreciable edema.  No erythema.  Neurological: Grossly intact via light touch  Musculoskeletal Exam: No pedal deformities noted   Assessment/Plan of Care: 1.  Lumbar radiculopathy; chronic 2.  Paresthesia plantar aspect of bilateral foot  -Patient evaluated. -After discussing with the patient I do believe that she is experiencing lumbar radiculopathy which is manifesting in her feet.  Onset was about the same time.  She has a long history of lumbar DDD -Continue good supportive shoes and sneakers -She has tried gabapentin  in the past with no improvement -Recommend follow-up with neurospine -Return to clinic PRN       Dot Gazella, DPM [facility]  Dr. Dot Gazella, DPM    [address] [place], [place] [address]                Office [phone]  Fax [phone]

## 2023-12-10 ENCOUNTER — Encounter: Payer: Self-pay | Admitting: Physical Medicine & Rehabilitation

## 2023-12-15 NOTE — Telephone Encounter (Signed)
 Patient called to follow-up to discuss test results.

## 2024-01-12 ENCOUNTER — Ambulatory Visit (HOSPITAL_COMMUNITY)
Admission: RE | Admit: 2024-01-12 | Discharge: 2024-01-12 | Disposition: A | Discharge status: Home / Self Care | Source: Ambulatory Visit | Attending: Internal Medicine | Admitting: Internal Medicine

## 2024-01-12 ENCOUNTER — Inpatient Hospital Stay: Payer: Medicare Other | Attending: Internal Medicine

## 2024-01-12 DIAGNOSIS — Z902 Acquired absence of lung [part of]: Secondary | ICD-10-CM | POA: Diagnosis not present

## 2024-01-12 DIAGNOSIS — K5909 Other constipation: Secondary | ICD-10-CM | POA: Diagnosis not present

## 2024-01-12 DIAGNOSIS — Z85118 Personal history of other malignant neoplasm of bronchus and lung: Secondary | ICD-10-CM | POA: Insufficient documentation

## 2024-01-12 DIAGNOSIS — C3490 Malignant neoplasm of unspecified part of unspecified bronchus or lung: Secondary | ICD-10-CM | POA: Insufficient documentation

## 2024-01-12 DIAGNOSIS — Z86711 Personal history of pulmonary embolism: Secondary | ICD-10-CM | POA: Insufficient documentation

## 2024-01-12 DIAGNOSIS — K449 Diaphragmatic hernia without obstruction or gangrene: Secondary | ICD-10-CM | POA: Diagnosis not present

## 2024-01-12 DIAGNOSIS — D509 Iron deficiency anemia, unspecified: Secondary | ICD-10-CM | POA: Diagnosis not present

## 2024-01-12 LAB — CBC WITH DIFFERENTIAL (CANCER CENTER ONLY)
Abs Immature Granulocytes: 0.01 K/uL (ref 0.00–0.07)
Basophils Absolute: 0.1 K/uL (ref 0.0–0.1)
Basophils Relative: 1 %
Eosinophils Absolute: 0.1 K/uL (ref 0.0–0.5)
Eosinophils Relative: 2 %
HCT: 36.9 % (ref 36.0–46.0)
Hemoglobin: 11.7 g/dL — ABNORMAL LOW (ref 12.0–15.0)
Immature Granulocytes: 0 %
Lymphocytes Relative: 33 %
Lymphs Abs: 2.2 K/uL (ref 0.7–4.0)
MCH: 24 pg — ABNORMAL LOW (ref 26.0–34.0)
MCHC: 31.7 g/dL (ref 30.0–36.0)
MCV: 75.8 fL — ABNORMAL LOW (ref 80.0–100.0)
Monocytes Absolute: 0.7 K/uL (ref 0.1–1.0)
Monocytes Relative: 10 %
Neutro Abs: 3.6 K/uL (ref 1.7–7.7)
Neutrophils Relative %: 54 %
Platelet Count: 247 K/uL (ref 150–400)
RBC: 4.87 MIL/uL (ref 3.87–5.11)
RDW: 15.1 % (ref 11.5–15.5)
WBC Count: 6.6 K/uL (ref 4.0–10.5)
nRBC: 0 % (ref 0.0–0.2)

## 2024-01-12 LAB — CMP (CANCER CENTER ONLY)
ALT: 10 U/L (ref 0–44)
AST: 15 U/L (ref 15–41)
Albumin: 3.9 g/dL (ref 3.5–5.0)
Alkaline Phosphatase: 72 U/L (ref 38–126)
Anion gap: 5 (ref 5–15)
BUN: 15 mg/dL (ref 8–23)
CO2: 29 mmol/L (ref 22–32)
Calcium: 9.5 mg/dL (ref 8.9–10.3)
Chloride: 106 mmol/L (ref 98–111)
Creatinine: 0.73 mg/dL (ref 0.44–1.00)
GFR, Estimated: >60 mL/min (ref >60)
Glucose, Bld: 93 mg/dL (ref 70–99)
Potassium: 4.2 mmol/L (ref 3.5–5.1)
Sodium: 140 mmol/L (ref 135–145)
Total Bilirubin: 0.3 mg/dL (ref 0.0–1.2)
Total Protein: 6.9 g/dL (ref 6.5–8.1)

## 2024-01-12 MED ORDER — IOHEXOL 300 MG/ML  SOLN
75.0000 mL | Freq: Once | INTRAMUSCULAR | Status: AC | PRN
  Administered 2024-01-12: 75 mL via INTRAVENOUS

## 2024-01-19 ENCOUNTER — Inpatient Hospital Stay (HOSPITAL_BASED_OUTPATIENT_CLINIC_OR_DEPARTMENT_OTHER): Payer: Medicare Other | Admitting: Internal Medicine

## 2024-01-19 VITALS — BP 155/57 | HR 69 | Temp 97.7°F | Resp 16 | Ht 63.0 in | Wt 162.0 lb

## 2024-01-19 DIAGNOSIS — C3490 Malignant neoplasm of unspecified part of unspecified bronchus or lung: Secondary | ICD-10-CM

## 2024-01-19 NOTE — Progress Notes (Signed)
 [facility] [phone]   [phone]  OFFICE PROGRESS NOTE  Laura Elsie SAUNDERS, MD [phone] [address] [place] [address]  DIAGNOSIS:  1) Stage IA (T1c, N0, M0) non-small cell lung cancer, adenocarcinoma diagnosed in December 2022 2) incidental finding of pulmonary embolus within the right middle lobar pulmonary artery on CT scan of 01/13/2022  PRIOR THERAPY:   1) Status post left lower lobectomy with lymph node dissection under the care of Dr. Kerrin on May 30, 2021. 2) Eliquis  5 mg p.o. twice daily started January 13, 2022.  She will complete 6 months of her treatment this month.  CURRENT THERAPY: Observation  INTERVAL HISTORY: Laura [name] 78 y.o. female returns to the clinic today for follow-up visit accompanied by her husband.Discussed the use of AI scribe software for clinical note transcription with the patient, who gave verbal consent to proceed.  History of Present Illness Laura [name] is a 78 year old female with stage IA non-small cell lung cancer who presents for evaluation with repeat CT scan of the chest for restaging of her disease. She is accompanied by her husband.  She has a history of stage IA non-small cell lung cancer, adenocarcinoma, diagnosed in December 2022. She underwent a left lower lobectomy with lymph node dissection and is currently under observation.  She feels generally okay but has been experiencing problems with her back, legs, and stomach since her lung operation. She describes a lack of energy and motivation, impacting her ability to engage in activities she enjoys, such as cleaning, working on puzzles, coloring, and reading the Bible. She mentions feeling 'down' and unable to 'get back up.'  She notes a weight loss prior to her cancer diagnosis, dropping from 170 pounds to a lower weight, but has since gained weight, currently at 162.5 pounds, up from 158 pounds in June.  She experiences  discomfort on her left side at night, describing it as a sensation of being 'jabbed' when lying on that side.  She has ongoing issues with her stomach, including constipation and a sensation of her stomach being too extended. She underwent a colonoscopy in February, which revealed four polyps, necessitating a follow-up in three months. She also has a pelvic floor issue affecting both constipation and urination.  She reports episodes of coughing that she cannot stop, accompanied by a runny nose, which she suspects might be related to allergies. Her husband has tried various remedies to alleviate her coughing without success.    MEDICAL HISTORY: Past Medical History:  Diagnosis Date   Anemia    Anxiety    Asthma    related to sesonal allergies   Cancer (HCC)    Lung   Cataract    Chronic combined systolic and diastolic CHF, NYHA class 2 (HCC) CARDIOLOGIST-  DR [id number]   Coronary artery disease    Depression    History of colon polyps 07/2019   tubular adenoma - Dr. Luis   History of kidney stones    History of non-ST elevation myocardial infarction (NSTEMI) 11/27/2009   SECONDARY TO TAKOTSUDO SYNDROME (CARDIAC CATH NORMAL)   Hyperlipemia    Hypertension    Hypoglycemia    Hypothyroidism    LBBB (left bundle branch block)    Left ventricular ejection fraction less than 40%    38% PER CARDIOLOGIST NOTE (DR CROITORU)   Memory loss    Mood swings    Myocardial infarction [facility]) 2011   Nonischemic  dilated cardiomyopathy (HCC)    MODERATELY DEPRESSED LVF;EF 35-45% by Echo 05/27/11   OSA (obstructive sleep apnea) MODERATE PER STUDY 2005   CPAP NONCOMPLIANT   Pre-diabetes    Pulmonary embolism (HCC)    after the lobectomy   Seasonal allergies    Sleep apnea    SUI (stress urinary incontinence, female)     ALLERGIES:  is allergic to codeine, ciprofloxacin, ace inhibitors, citalopram , fetzima [levomilnacipran], lasix [furosemide], linzess [linaclotide], nsaids,  sulfamethoxazole-trimethoprim, and xanax xr [alprazolam er].  MEDICATIONS:  Current Outpatient Medications  Medication Sig Dispense Refill   atorvastatin  (LIPITOR ) 80 MG tablet TAKE 1/2 TABLET BY MOUTH DAILY FOR CHOLESTEROL 45 tablet 3   cyclobenzaprine  (FLEXERIL ) 5 MG tablet Take 1 tablet (5 mg total) by mouth 3 (three) times daily as needed for muscle spasms. (Patient not taking: Reported on 09/09/2023) 60 tablet 5   diazepam  (VALIUM ) 10 MG tablet Take 10 mg by mouth every 6 (six) hours as needed for anxiety.     hydrALAZINE  (APRESOLINE ) 50 MG tablet TAKE 1 TABLET BY MOUTH TWICE A DAY (Patient taking differently: Take 75 mg by mouth 2 (two) times daily.) 180 tablet 2   irbesartan  (AVAPRO ) 300 MG tablet Take 300 mg by mouth daily.     levothyroxine  (SYNTHROID , LEVOTHROID) 88 MCG tablet Take 88 mcg by mouth daily before breakfast.     Melatonin 10 MG TABS Take by mouth at bedtime as needed. Pt taking 21/2 mg a day     metoprolol  succinate (TOPROL -XL) 50 MG 24 hr tablet TAKE ONE AND ONE-HALF TABLETS BY MOUTH DAILY (Patient taking differently: Pt taking 75 mg a day) 90 tablet 3   NUVIGIL  250 MG tablet Take 250 mg by mouth daily as needed (energy boost).  3   pantoprazole  (PROTONIX ) 40 MG tablet Take 1 tablet (40 mg total) by mouth daily. 90 tablet 1   polyethylene glycol (MIRALAX ) 17 g packet Take 17 g by mouth 3 (three) times daily.     Current Facility-Administered Medications  Medication Dose Route Frequency Provider Last Rate Last Admin   0.9 %  sodium chloride  infusion  500 mL Intravenous Once Federico Rosario BROCKS, MD        SURGICAL HISTORY:  Past Surgical History:  Procedure Laterality Date   CARDIAC CATHETERIZATION  09-04-1999;  08/25/2004;   12/09/2009  DR FRANCYNE   NORMAL CORONARIES/  APICAL BALLOONING OF LV CONSISTENT WITH TAKOTSUBO SYMPTOMS/ EF 30-35%   CATARACT EXTRACTION W/ INTRAOCULAR LENS  IMPLANT, BILATERAL     CHOLECYSTECTOMY N/A 09/29/2012   Procedure: LAPAROSCOPIC  CHOLECYSTECTOMY WITH INTRAOPERATIVE CHOLANGIOGRAM;  Surgeon: Elspeth KYM Schultze, MD;  Location: [facility] OR;  Service: General;  Laterality: N/A;   COLONOSCOPY  07/2020   CYSTOSCOPY N/A 09/19/2012   Procedure: PHYLLIS SIDE;  Surgeon: Alm GORMAN Fragmin, MD;  Location: [facility];  Service: Urology;  Laterality: N/A;   DILATION AND CURETTAGE OF UTERUS     EYE SURGERY Bilateral    cataract removal   INTERCOSTAL NERVE BLOCK  05/30/2021   Procedure: INTERCOSTAL NERVE BLOCK;  Surgeon: Kerrin Elspeth BROCKS, MD;  Location: [facility] OR;  Service: Thoracic;;   KNEE ARTHROSCOPY W/ MENISCECTOMY Right 07/27/2011   MEDIAL AND LATERAL   LOBECTOMY  05/30/2021   Procedure: LEFT LOWER LOBECTOMY;  Surgeon: Kerrin Elspeth BROCKS, MD;  Location: [facility] OR;  Service: Thoracic;;   LYMPH NODE DISSECTION  05/30/2021   Procedure: LYMPH NODE DISSECTION;  Surgeon: Kerrin Elspeth BROCKS, MD;  Location: [facility] OR;  Service: Thoracic;;   NASAL SEPTUM SURGERY  02/28/1979   PUBOVAGINAL SLING N/A 09/19/2012   Procedure: SUBURETHRAL OZIE GLADE;  Surgeon: Alm GORMAN Fragmin, MD;  Location: [facility];  Service: Urology;  Laterality: N/A;   RIGHT URETEROSCOPIC STONE EXTRACTION  08/31/2000   TRANSTHORACIC ECHOCARDIOGRAM  05-27-2011  DR CROITORU   MODERATELY DEPRESSED LVF DUE TO GLOBAL HYPOKINESIS AND MARKED SYSTOLIC ASYNCHRONY/ EF 38%/ MILD LEFT ATRIAL DILATATION   VAGINAL HYSTERECTOMY  06/30/1983   partial    REVIEW OF SYSTEMS:  Constitutional: positive for fatigue and weight loss Eyes: negative Ears, nose, mouth, throat, and face: negative Respiratory: positive for pleurisy/chest pain Cardiovascular: negative Gastrointestinal: positive for abdominal pain and change in bowel habits Genitourinary:negative Integument/breast: negative Hematologic/lymphatic: negative Musculoskeletal:positive for arthralgias Neurological: negative Behavioral/Psych: negative Endocrine: negative Allergic/Immunologic: negative    PHYSICAL EXAMINATION: General appearance: alert, cooperative, fatigued, and no distress Head: Normocephalic, without obvious abnormality, atraumatic Neck: no adenopathy, no JVD, supple, symmetrical, trachea midline, and thyroid  not enlarged, symmetric, no tenderness/mass/nodules Lymph nodes: Cervical, supraclavicular, and axillary nodes normal. Resp: clear to auscultation bilaterally Back: symmetric, no curvature. ROM normal. No CVA tenderness. Cardio: regular rate and rhythm, S1, S2 normal, no murmur, click, rub or gallop GI: soft, non-tender; bowel sounds normal; no masses,  no organomegaly Extremities: extremities normal, atraumatic, no cyanosis or edema Neurologic: Alert and oriented X 3, normal strength and tone. Normal symmetric reflexes. Normal coordination and gait  ECOG PERFORMANCE STATUS: 1 - Symptomatic but completely ambulatory  Blood pressure (!) 155/57, pulse 69, temperature 97.7 F (36.5 C), resp. rate 16, height 5' 3 (1.6 m), weight 162 lb (73.5 kg), SpO2 97%.  LABORATORY DATA: Lab Results  Component Value Date   WBC 6.6 01/12/2024   HGB 11.7 (L) 01/12/2024   HCT 36.9 01/12/2024   MCV 75.8 (L) 01/12/2024   PLT 247 01/12/2024      Chemistry      Component Value Date/Time   NA 140 01/12/2024 1127   NA 144 08/30/2020 1550   K 4.2 01/12/2024 1127   CL 106 01/12/2024 1127   CO2 29 01/12/2024 1127   BUN 15 01/12/2024 1127   BUN 10 08/30/2020 1550   CREATININE 0.73 01/12/2024 1127   CREATININE 0.75 05/15/2015 1306      Component Value Date/Time   CALCIUM  9.5 01/12/2024 1127   ALKPHOS 72 01/12/2024 1127   AST 15 01/12/2024 1127   ALT 10 01/12/2024 1127   BILITOT 0.3 01/12/2024 1127       RADIOGRAPHIC STUDIES: No results found.   ASSESSMENT AND PLAN: This is a very pleasant 78 years old white female diagnosed with: 1) Stage IA (T1c, N0, M0) non-small cell lung cancer, adenocarcinoma diagnosed in December 2022 Status post left lower lobectomy with lymph  node dissection under the care of Dr. Kerrin on May 30, 2021. The patient is currently on observation and she is feeling fine. She had repeat CT scan of the chest performed recently.  I personally independently reviewed the scan and discussed the result with the patient today.  Her scan showed no concerning findings for disease recurrence or metastasis. Assessment and Plan Assessment & Plan Stage 1A non-small cell lung cancer, adenocarcinoma Status post left lower lobectomy with lymph node dissection. Currently no evidence of local recurrence or metastatic disease on recent CT scan. Reports feeling down and lacking energy, possibly related to overall health status and anemia. - Schedule repeat CT scan and lab work in one year.  Post-surgical scarring  causing tension Post-surgical scarring on the left side causing tension and discomfort when lying on the left side, described as feeling like a piece of cardboard.  Iron deficiency anemia Mild iron deficiency anemia with hemoglobin at 11.7 g/dL and MCV at 24.1 fL, possibly contributing to fatigue and lack of energy. - Recommend iron supplementation every other day.  Constipation Chronic constipation, possibly related to pelvic floor dysfunction. Recent colonoscopy in February revealed four polyps, necessitating follow-up in three months.  Hiatal hernia with gastroesophageal reflux Small hiatal hernia with associated gastroesophageal reflux. Reports occasional coughing and tickling sensation, possibly related to acid reflux or allergies. - Advise starting with family doctor for allergy  evaluation. The patient was advised to call immediately if she has any concerning symptoms in the  The patient voices understanding of current disease status and treatment options and is in agreement with the current care plan.  All questions were answered. The patient knows to call the clinic with any problems, questions or concerns. We can certainly see  the patient much sooner if necessary.  The total time spent in the appointment was 30 minutes.  Disclaimer: This note was dictated with voice recognition software. Similar sounding words can inadvertently be transcribed and may not be corrected upon review.

## 2024-01-21 ENCOUNTER — Ambulatory Visit: Admitting: Internal Medicine

## 2024-01-24 ENCOUNTER — Encounter: Payer: Self-pay | Admitting: Internal Medicine

## 2024-01-24 ENCOUNTER — Ambulatory Visit (INDEPENDENT_AMBULATORY_CARE_PROVIDER_SITE_OTHER): Admitting: Internal Medicine

## 2024-01-24 ENCOUNTER — Telehealth: Payer: Self-pay | Admitting: Internal Medicine

## 2024-01-24 ENCOUNTER — Telehealth: Payer: Self-pay

## 2024-01-24 ENCOUNTER — Ambulatory Visit
Admission: RE | Admit: 2024-01-24 | Discharge: 2024-01-24 | Disposition: A | Discharge status: Home / Self Care | Source: Ambulatory Visit | Attending: Internal Medicine | Admitting: Internal Medicine

## 2024-01-24 VITALS — BP 146/80 | HR 66 | Ht 62.99 in | Wt 163.1 lb

## 2024-01-24 DIAGNOSIS — K5900 Constipation, unspecified: Secondary | ICD-10-CM | POA: Diagnosis not present

## 2024-01-24 DIAGNOSIS — R140 Abdominal distension (gaseous): Secondary | ICD-10-CM | POA: Diagnosis not present

## 2024-01-24 DIAGNOSIS — K638219 Small intestinal bacterial overgrowth, unspecified: Secondary | ICD-10-CM

## 2024-01-24 DIAGNOSIS — K580 Irritable bowel syndrome with diarrhea: Secondary | ICD-10-CM

## 2024-01-24 MED ORDER — RIFAXIMIN 550 MG PO TABS: 550.0000 mg | ORAL_TABLET | Freq: Three times a day (TID) | ORAL | 0 refills | Status: DC

## 2024-01-24 NOTE — Telephone Encounter (Signed)
 Patient stated that rifaximin  prescription is $3000. Requesting to know if there is an alternative medication. Please advise, Thank you

## 2024-01-24 NOTE — Telephone Encounter (Signed)
 T/C from pt wanting to know if she is suppose to be getting an inj for iron or what she is suppose to be doing.  Advised pt of her lb results and Dr Jeannett recommendations.SABRASABRASABRAMild iron deficiency anemia with hemoglobin at 11.7 g/dL and MCV at 24.1 fL, possibly contributing to fatigue and lack of energy. - Recommend iron supplementation every other day.  Pt will try the oral iron every other day and see how she does.  She is very concerned about the side effect of constipation and will discuss with the Dr that manages her pelvic floor dysfunction.

## 2024-01-24 NOTE — Progress Notes (Signed)
 Chief Complaint: Constipation, bloating  HPI : 78 year old female with history of anxiety, lung adenocarcinoma s/p LLL lobectomy in 05/2021, HFpEF (EF 45-50%), COPD, CAD, hypothyroidism, OSA, PE, and HTN presents for follow up of constipation and bloating.  Since 2020, she has had issues with constipation and bloating. She has chronic back pain issues. She was diagnosed with PE when she was being worked up for lung cancer. She took Eliquis  for a period of time and stopped the Eliquis  in 06/2022. Her lung cancer was treated with a LLL lobectomy. She was having trouble with constipation when she was getting treated for lung cancer. She was referred to pelvic floor physical therapist and she completed 5 sessions. She was also given a pessary.   From Dr. Katopes last note 03/04/22: Although Laura [name] has opioids in her profile she reports she rarely uses them. Her story today revolves mostly around her back pain issues, but her episodic abdominal pain and bloating is likely the result of a combination of pelvic floor dysfunction and potentially a component of neurogenic bowel given all these back issues and injections she has had. I make note that she has no structural issues that I can see based on prior colonoscopy and imaging. In addition, she reports to me that almost every laxative regimen (Lactulose, Miralax , stiumulants, Amitixa, Linzess, Trulance, IBSRela) have failed her. I feel she needs to see Pelvic health for further evaluation and recommendations here, as I suspect a strong component of pelvic floor dysfunction.  Patient did have significant benefit from therapy with rifaximin  in the past.  Interval History: She has felt bloated ever since 09/2023. She will take Miralax , which seems to help with her constipation. She thinks that her bloating may be leading to her back pain. She told her urogynecologist that she could not exercise because of her bad back. Sometimes she runs ot the bathroom but  doesn't feel like she can get the stool out. She does have rectal pain on occasion. Can sometimes pass gas without realizing it  Wt Readings from Last 3 Encounters:  01/24/24 163 lb 2 oz (74 kg)  01/19/24 162 lb (73.5 kg)  12/08/23 158 lb (71.7 kg)   Past Medical History:  Diagnosis Date   Anemia    Anxiety    Asthma    related to sesonal allergies   Cancer (HCC)    Lung   Cataract    Chronic combined systolic and diastolic CHF, NYHA class 2 (HCC) CARDIOLOGIST-  DR [id number]   Coronary artery disease    Depression    History of colon polyps 07/2019   tubular adenoma - Dr. Luis   History of [name] stones    History of non-ST elevation myocardial infarction (NSTEMI) 11/27/2009   SECONDARY TO TAKOTSUDO SYNDROME (CARDIAC CATH NORMAL)   Hyperlipemia    Hypertension    Hypoglycemia    Hypothyroidism    LBBB (left bundle branch block)    Left ventricular ejection fraction less than 40%    38% PER CARDIOLOGIST NOTE (DR CROITORU)   Memory loss    Mood swings    Myocardial infarction (HCC) 2011   Nonischemic dilated cardiomyopathy (HCC)    MODERATELY DEPRESSED LVF;EF 35-45% by Echo 05/27/11   OSA (obstructive sleep apnea) MODERATE PER STUDY 2005   CPAP NONCOMPLIANT   Pre-diabetes    Pulmonary embolism (HCC)    after the lobectomy   Seasonal allergies    Sleep apnea    SUI (stress urinary incontinence, female)  Past Surgical History:  Procedure Laterality Date   CARDIAC CATHETERIZATION  09-04-1999;  08/25/2004;   12/09/2009  DR CROITORU   NORMAL CORONARIES/  APICAL BALLOONING OF LV CONSISTENT WITH TAKOTSUBO SYMPTOMS/ EF 30-35%   CATARACT EXTRACTION W/ INTRAOCULAR LENS  IMPLANT, BILATERAL     CHOLECYSTECTOMY N/A 09/29/2012   Procedure: LAPAROSCOPIC CHOLECYSTECTOMY WITH INTRAOPERATIVE CHOLANGIOGRAM;  Surgeon: Elspeth KYM Schultze, MD;  Location: [facility] OR;  Service: General;  Laterality: N/A;   COLONOSCOPY  07/2020   CYSTOSCOPY N/A 09/19/2012   Procedure: PHYLLIS SIDE;   Surgeon: Alm GORMAN Fragmin, MD;  Location: [facility];  Service: Urology;  Laterality: N/A;   DILATION AND CURETTAGE OF UTERUS     EYE SURGERY Bilateral    cataract removal   INTERCOSTAL NERVE BLOCK  05/30/2021   Procedure: INTERCOSTAL NERVE BLOCK;  Surgeon: Kerrin Elspeth BROCKS, MD;  Location: [facility] OR;  Service: Thoracic;;   KNEE ARTHROSCOPY W/ MENISCECTOMY Right 07/27/2011   MEDIAL AND LATERAL   LOBECTOMY  05/30/2021   Procedure: LEFT LOWER LOBECTOMY;  Surgeon: Kerrin Elspeth BROCKS, MD;  Location: [facility] OR;  Service: Thoracic;;   LYMPH NODE DISSECTION  05/30/2021   Procedure: LYMPH NODE DISSECTION;  Surgeon: Kerrin Elspeth BROCKS, MD;  Location: [facility] OR;  Service: Thoracic;;   NASAL SEPTUM SURGERY  02/28/1979   PUBOVAGINAL SLING N/A 09/19/2012   Procedure: SUBURETHRAL OZIE GLADE;  Surgeon: Alm GORMAN Fragmin, MD;  Location: [facility];  Service: Urology;  Laterality: N/A;   RIGHT URETEROSCOPIC STONE EXTRACTION  08/31/2000   TRANSTHORACIC ECHOCARDIOGRAM  05-27-2011  DR CROITORU   MODERATELY DEPRESSED LVF DUE TO GLOBAL HYPOKINESIS AND MARKED SYSTOLIC ASYNCHRONY/ EF 38%/ MILD LEFT ATRIAL DILATATION   VAGINAL HYSTERECTOMY  06/30/1983   partial   Family History  Problem Relation Age of Onset   Pneumonia Mother    Hypertension Mother    Heart attack Father    Fibromyalgia Brother    Pulmonary embolism Brother    Hypertension Brother    Heart disease Brother    Heart attack Paternal Grandfather    Breast cancer Neg Hx    Colon cancer Neg Hx    Esophageal cancer Neg Hx    Rectal cancer Neg Hx    Stomach cancer Neg Hx    Social History   Tobacco Use   Smoking status: Never   Smokeless tobacco: Never  Vaping Use   Vaping status: Never Used  Substance Use Topics   Alcohol use: No    Alcohol/week: 0.0 standard drinks of alcohol   Drug use: No   Current Outpatient Medications  Medication Sig Dispense Refill   atorvastatin  (LIPITOR ) 80 MG tablet TAKE 1/2  TABLET BY MOUTH DAILY FOR CHOLESTEROL 45 tablet 3   diazepam  (VALIUM ) 10 MG tablet Take 10 mg by mouth every 6 (six) hours as needed for anxiety.     DULoxetine  (CYMBALTA ) 60 MG capsule Take 1 capsule by mouth daily.     hydrALAZINE  (APRESOLINE ) 50 MG tablet TAKE 1 TABLET BY MOUTH TWICE A DAY (Patient taking differently: Take 75 mg by mouth 1 day or 1 dose.) 180 tablet 2   irbesartan  (AVAPRO ) 300 MG tablet Take 300 mg by mouth daily.     levothyroxine  (SYNTHROID , LEVOTHROID) 88 MCG tablet Take 88 mcg by mouth daily before breakfast.     Melatonin 10 MG TABS Take by mouth at bedtime as needed. Pt taking 21/2 mg a day     metoprolol  succinate (TOPROL -XL) 50 MG 24  hr tablet TAKE ONE AND ONE-HALF TABLETS BY MOUTH DAILY (Patient taking differently: Take 75 mg by mouth daily.) 90 tablet 3   NUVIGIL  250 MG tablet Take 250 mg by mouth daily as needed (energy boost).  3   polyethylene glycol (MIRALAX ) 17 g packet Take 17 g by mouth 3 (three) times daily.     cyclobenzaprine  (FLEXERIL ) 5 MG tablet Take 1 tablet (5 mg total) by mouth 3 (three) times daily as needed for muscle spasms. (Patient not taking: Reported on 01/24/2024) 60 tablet 5   pantoprazole  (PROTONIX ) 40 MG tablet Take 1 tablet (40 mg total) by mouth daily. (Patient not taking: Reported on 01/24/2024) 90 tablet 1   Current Facility-Administered Medications  Medication Dose Route Frequency Provider Last Rate Last Admin   0.9 %  sodium chloride  infusion  500 mL Intravenous Once Linkoln Alkire C, MD       Allergies  Allergen Reactions   Codeine Anaphylaxis, Hives and Other (See Comments)    Headache. Daughter reported that it caused her throat to swell up    Ciprofloxacin Nausea And Vomiting   Ace Inhibitors Other (See Comments)    Unknown- it didn't agree with her    Citalopram  Other (See Comments)    Fatigue   Fetzima [Levomilnacipran] Other (See Comments)    Talking out of my head   Lasix [Furosemide] Other (See Comments)    HEADACHE    Linzess [Linaclotide] Nausea Only   Nsaids Other (See Comments)    Told not to take NSAIDs because of her heart    Sulfamethoxazole-Trimethoprim Other (See Comments)    NERVOUS AND DISORENTED   Xanax Xr [Alprazolam Er] Other (See Comments)    confusion    Physical Exam: BP (!) 146/80 (BP Location: Left Arm, Patient Position: Sitting, Cuff Size: Normal)   Pulse 66   Ht 5' 2.99 (1.6 m)   Wt 163 lb 2 oz (74 kg)   BMI 28.90 kg/m  Constitutional: Pleasant,well-developed, female in no acute distress. HEENT: Normocephalic and atraumatic. Conjunctivae are normal. No scleral icterus. Cardiovascular: Normal rate Pulmonary/chest: Effort normal Abdominal: Soft, nondistended, nontender Extremities: No edema Neurological: Alert and oriented to person place and time. Skin: Skin is warm and dry. No rashes noted. Psychiatric: Normal mood and affect. Behavior is normal.  Labs 01/2022: TSH nml.   Labs 02/2022: CBC and CMP unremarkable.   Labs 06/2022: CBC and CMP unremarkable  Labs 12/2022: CBC and CMP unremarkable.  Labs 12/2023: CBC with low Hb of 11.7. CMP nml.   SIBO breath test (collected 05/22/23), which was positive for SIBO. Increase in hydrogen was 26 ppm (nml<20) and increase in methane was 3 ppm (nml<12).   Complete U/S 07/21/19: Normal abdominal ultrasound. Cholecystectomy.  KUB 12/11/21: IMPRESSION: Bowel-gas pattern is unremarkable. No radiographic evidence of constipation. Moderate thoracolumbar spondylosis. No acute cardiopulmonary disease.  CT A/P w/contrast 03/06/22: IMPRESSION:  1. No acute findings   CT A/P w/contrast 05/13/23: IMPRESSION: Left colonic diverticulosis.  No active diverticulitis. Aortic atherosclerosis. No acute findings in the abdomen or pelvis.  CT chest w/contrast 01/12/24: IMPRESSION: 1. Prior left lower lobectomy without evidence of local recurrence. 2. No evidence of metastatic disease in the chest. 3. Gas fluid levels and a patulous  esophagus with a tiny hiatal hernia, correlate for gastroesophageal reflux. Aortic Atherosclerosis (ICD10-I70.0) and Emphysema (ICD10-J43.9).   EGD 08/03/2019: Normal esophagus.  3 cm hiatal hernia.  2 adjacent 8 mm semi-pedunculated gastric polyps that were resected with hot snare.  Stomach biopsies  were obtained.  Normal duodenum.  Path: B.  STOMACH POLYP, ANTRUM, POLYPECTOMY:       Polypoid fragments of antral type gastric mucosa with  reactive gastropathy and chronic active gastritis with focal  intestinal metaplasia.       No H. pylori identified on H&E stained slide or  immunohistochemistry.      No dysplasia or malignancy identified.       See Comment.  COMMENT:  An immunohistochemical stain for Helicobacter pylori was  performed on block B1, and is negative for organisms.  The positive immunohistochemical control worked appropriately.   Colonoscopy 08/03/2019: 7 mm semipedunculated polyp in the mid ascending colon that was resected with cold snare.  Diverticulosis in the left colon.  Small internal hemorrhoids.  Repeat colonoscopy recommended in 5 years. Path: A.  COLON POLYP, ASCENDING, POLYPECTOMY:       Tubular adenoma.   Colonoscopy 06/03/23: - The examined portion of the ileum was normal. - Four 3 to 8 mm polyps in the transverse colon and in the ascending colon, removed with a cold snare. Resected and retrieved. - Diverticulosis in the sigmoid colon and in the descending colon. - Non- bleeding internal hemorrhoids. Path:      1. Surgical [P], colon, transverse and ascending, polyp (4) :       - TUBULAR ADENOMA(S)       - NEGATIVE FOR HIGH-GRADE DYSPLASIA OR MALIGNANCY   EGD 09/30/23: - The examined esophagus was normal. - A small hiatal hernia was present. - Localized inflammation characterized by congestion ( edema) , erosions and erythema was found in the gastric antrum. Biopsies were taken with a cold forceps for histology. - A single 14 mm sessile polyp with no bleeding  and no stigmata of recent bleeding was found in the gastric antrum. The polyp was removed with a cold snare. Resection and retrieval were complete. - Biopsies were taken with a cold forceps on the greater curvature of the gastric body, on the lesser curvature of the gastric body, at the incisura, on the greater curvature of the gastric antrum and on the lesser curvature of the gastric antrum for histology. Path: 1. Surgical [P], gastric polyp, polyp (1) :      -  ANTRAL-TYPE MUCOSA WITH SIGNIFICANT CHEMICAL/REACTIVE GASTROPATHY WITH      REACTIVE/REPARATIVE CHANGE, ASSOCIATED POLYPOID FOVEOLAR HYPERPLASIA AND FOCAL      EVIDENCE OF EROSION.      -  NEGATIVE FOR DYSPLASIA.      -  AN IMMUNOHISTOCHEMICAL STAIN FOR HELICOBACTER PYLORI ORGANISMS IS NEGATIVE.      2. Surgical [P], gastric incisura antrum :      -  ANTRAL-TYPE MUCOSA WITH FEATURES OF BOTH CHEMICAL/REACTIVE CHANGE AND CHRONIC      INACTIVE GASTRITIS.      -  AN IMMUNOHISTOCHEMICAL STAIN FOR HELICOBACTER PYLORI ORGANISMS IS NEGATIVE.      3. Surgical [P], gastric body :      -  PREDOMINANTLY ANTRAL-TYPE (NOTED TO BE FROM BODY) MUCOSA WITH MODERATE      CHRONIC FOCAL MINIMALLY ACTIVE GASTRITIS AND FOCAL INTESTINAL METAPLASIA      (FINDINGS SUGGESTIVE OF POSSIBLE ATROPHY.      -  AN IMMUNOHISTOCHEMICAL STAIN FOR HELICOBACTER PYLORI ORGANISMS IS NEGATIVE.   ASSESSMENT AND PLAN: Constipation Fecal incontinence, suspected overflow incontinence Bloating Nausea Gastric intestinal metaplasia Lower abdominal discomfort History of SIBO History of cholecystectomy Patient recently EGD done that showed that her GIM is focal, suggesting that no further surveillance  is necessarily needed.  She has had continued issues with bloating recently.  I do wonder if she may have significant stool burden that is leading to her bloating symptoms.  Will check a KUB to assess her stool burden and have her do another course of rifaximin  since her prior course  of rifaximin  seem to help significantly with her bloating symptoms.  Will also give her some samples of IBgard to see if this helps with her bloating as well. - Continue Miralax  - Will give samples of IBGard - KUB to assess stool burden. If this does show significant stool burden, then can try a bowel purge - Will try to get her approved for a course of rifaximin  550 mg TID for 14 days. If cannot be approved, then would plan to prescribe doxycycline  100 mg BID - Next colonoscopy is due in 05/2026 for polyp surveillance - RTC 3 months  Laura Kidney, MD  I spent 40 minutes of time, including in depth chart review, independent review of results as outlined above, communicating results with the patient directly, face-to-face time with the patient, coordinating care, ordering studies and medications as appropriate, and documentation.

## 2024-01-24 NOTE — Patient Instructions (Addendum)
 Your provider has requested that you have an abdominal x ray before leaving today. Please go to the basement floor to our Radiology department for the test.  _______________________________________________________  If your blood pressure at your visit was 140/90 or greater, please contact your primary care physician to follow up on this.  _______________________________________________________  If you are age 78 or older, your body mass index should be between 23-30. Your Body mass index is 28.9 kg/m. If this is out of the aforementioned range listed, please consider follow up with your Primary Care Provider.  If you are age 37 or younger, your body mass index should be between 19-25. Your Body mass index is 28.9 kg/m. If this is out of the aformentioned range listed, please consider follow up with your Primary Care Provider.   ________________________________________________________  The [facility] providers would like to encourage you to use MYCHART to communicate with providers for non-urgent requests or questions.  Due to long hold times on the telephone, sending your provider a message by [facility] may be a faster and more efficient way to get a response.  Please allow 48 business hours for a response.  Please remember that this is for non-urgent requests.  _______________________________________________________  [facility] is using a team-based approach to care.  Your team is made up of your doctor and two to three APPS. Our APPS (Nurse Practitioners and Physician Assistants) work with your physician to ensure care continuity for you. They are fully qualified to address your health concerns and develop a treatment plan. They communicate directly with your gastroenterologist to care for you. Seeing the Advanced Practice Practitioners on your physician's team can help you by facilitating care more promptly, often allowing for earlier appointments, access to diagnostic testing,  procedures, and other specialty referrals.   Due to recent changes in healthcare laws, you may see the results of your imaging and laboratory studies on MyChart before your provider has had a chance to review them.  We understand that in some cases there may be results that are confusing or concerning to you. Not all laboratory results come back in the same time frame and the provider may be waiting for multiple results in order to interpret others.  Please give us  48 hours in order for your provider to thoroughly review all the results before contacting the office for clarification of your results.   Thank you for entrusting me with your care and for choosing [facility], Dr. Estefana Kidney

## 2024-01-24 NOTE — Telephone Encounter (Signed)
 Left the patient a voicemail with the scheduled appointment details.

## 2024-01-25 ENCOUNTER — Telehealth: Payer: Self-pay | Admitting: Physician Assistant

## 2024-01-25 NOTE — Telephone Encounter (Signed)
Scheduled appointments with the patient

## 2024-01-26 MED ORDER — DOXYCYCLINE HYCLATE 100 MG PO TABS: 100.0000 mg | ORAL_TABLET | Freq: Two times a day (BID) | ORAL | 0 refills | Status: AC

## 2024-01-26 NOTE — Telephone Encounter (Signed)
 Pt notified that doxycycline  100mg  one tablet two times daily for 10 days was sent to pharmacy in place of rifaximin .  Patient agreed to plan and verbalized understanding.  No further questions.

## 2024-01-28 ENCOUNTER — Encounter: Attending: Physical Medicine & Rehabilitation | Admitting: Physical Medicine & Rehabilitation

## 2024-01-28 ENCOUNTER — Encounter: Payer: Self-pay | Admitting: Physical Medicine & Rehabilitation

## 2024-01-28 VITALS — BP 164/76 | HR 77 | Ht 62.99 in | Wt 163.6 lb

## 2024-01-28 DIAGNOSIS — M1712 Unilateral primary osteoarthritis, left knee: Secondary | ICD-10-CM | POA: Insufficient documentation

## 2024-01-28 DIAGNOSIS — M533 Sacrococcygeal disorders, not elsewhere classified: Secondary | ICD-10-CM | POA: Diagnosis not present

## 2024-01-28 MED ORDER — LIDOCAINE HCL 1 % IJ SOLN
4.0000 mL | Freq: Once | INTRAMUSCULAR | Status: AC
  Administered 2024-01-28: 4 mL via INTRADERMAL

## 2024-01-28 MED ORDER — BETAMETHASONE SOD PHOS & ACET 6 (3-3) MG/ML IJ SUSP
6.0000 mg | Freq: Once | INTRAMUSCULAR | Status: AC
  Administered 2024-01-28: 6 mg via INTRA_ARTICULAR

## 2024-01-28 NOTE — Patient Instructions (Signed)
 Sacroiliac Joint Dysfunction: What to Know  Sacroiliac joint dysfunction causes swelling in one or both sides of the sacroiliac (SI) joint. The SI joint is where two bones in the pelvis meet. The two bones are the sacrum, which is the bone at the bottom of your spine, and the ilium, which is the big bone that makes up your hip. SI joint dysfunction can cause a deep ache or burning pain in your lower back. Sometimes, the pain spreads to your butt, hips, or thighs. What are the causes? SI joint dysfunction may be caused by: Pregnancy. When a person is pregnant, extra pressure is put on the SI joints because the pelvis gets wider. Injuries, such as: Car accidents Sports injuries. Work injuries. Having one leg that is shorter than the other. Conditions that affect the joints, such as: Rheumatoid arthritis. Gout. Psoriatic arthritis. Joint infection. Sometimes, the cause of SI joint dysfunction is not known. What are the signs or symptoms? Symptoms of this condition include: Aching or burning pain in the lower back. The pain may also spread to other areas, such as: Butt. Groin. Thighs. Muscle spasms in or around the painful areas. More pain when standing, walking, running, stair climbing, bending, or lifting. How is this diagnosed? Your health care provider will check your medical history and do a physical exam. During the exam, your provider may move your legs to see if it hurts. They may also do tests like: Imaging tests to look for other causes of pain. These may include: MRI. CT scan. Bone scan. Diagnostic injection. Your provider puts numbing medicine into the SI joint. If your pain gets better after this, it may mean you have this condition. How is this treated? Treatment depends on what is causing the problem and how bad it is. Some options include: Ice or heat applied to the lower back area after an injury. This may help reduce pain and muscle spasms. Medicines to help with  pain and swelling, or to relax the muscles. Wearing a back brace, called an SI brace, to support your back while it heals. Physical therapy to help the muscles around the joint be more strong and improve movement. You may also learn how to move your body in ways that are easier on the joint. Direct manipulation. A therapist may gently move the SI joint to help it work better. Using a device that provides electrical stimulation to help reduce pain at the joint. Other treatments may include: Steroid injections. These can help reduce pain and swelling in the joint. Radiofrequency ablation. This uses heat to burn away nerves that are carrying pain messages from the joint. Surgery to put in screws and plates that limit or prevent joint motion. This is rare. Follow these instructions at home: Medicines Take your medicines only as told. You may need to take these steps to help prevent or treat trouble pooping (constipation): Take medicines to help you poop. Eat foods high in fiber, like beans, whole grains, and fresh fruits and vegetables. Drink more fluids as told. Ask your provider if it's safe to drive or use machines while taking your medicine. If you have a brace: Wear the brace as told. Take it off only if your provider says you can. Keep the brace clean. If the brace is not waterproof: Do not let it get wet. Managing pain, stiffness, and swelling     Icing can help with pain and swelling. Heat may help with muscle tension or spasms. Ask your provider if you  should use ice or heat. Use ice or an ice pack as told. Place a towel between your skin and the ice. Leave the ice on for 20 minutes, 2-3 times a day. Use heat as told. Use the heat source that your provider recommends, such as a moist heat pack or a heating pad. Do this as often as told. Place a towel between your skin and the heat source. Leave the heat on for 20-30 minutes. If your skin turns red, take off the ice or heat  right away to prevent skin damage. The risk of damage is higher if you can't feel pain, heat, or cold. General instructions Rest as told. Ask what things are safe for you to do at home. Ask when you can go back to work or school. Exercise as told. Contact a health care provider if: Your pain is not controlled with medicine. You have a fever. Your pain is getting worse. Get help right away if: You have weakness, numbness, or tingling in your legs or feet. You lose control of your bladder or bowels. This information is not intended to replace advice given to you by your health care provider. Make sure you discuss any questions you have with your health care provider. Document Revised: 05/21/2023 Document Reviewed: 05/21/2023 Elsevier Patient Education  2025 [employer].
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
## 2024-01-31 ENCOUNTER — Ambulatory Visit: Payer: Self-pay | Admitting: Internal Medicine

## 2024-02-02 NOTE — Telephone Encounter (Signed)
 Patient calling and stating that she has her noticed her stomach getting more and more bigger its looking like she is pregnant and was surprised about the stool being in her colon and wants to know if there is anything else to do.  She said that after eating at times her stomach will hurt and she will go to the bathroom and can have trouble in using the bathroom at times an wants to know if there is something else should try and I mentioned to her that sometimes people will do a fiber supplement to help regulate themselves in addition to the miralax  but she said she takes miralax  daily. And mentioned about having that sibo breath test that was done too. Please advise

## 2024-02-03 ENCOUNTER — Encounter: Admitting: Physical Medicine & Rehabilitation

## 2024-02-13 ENCOUNTER — Other Ambulatory Visit: Payer: Self-pay | Admitting: Physician Assistant

## 2024-02-13 DIAGNOSIS — D509 Iron deficiency anemia, unspecified: Secondary | ICD-10-CM

## 2024-02-13 NOTE — Progress Notes (Unsigned)
 [facility] OFFICE PROGRESS NOTE  Laura Elsie SAUNDERS, Laura [name] [address] [place] [address]  DIAGNOSIS:  1) Stage IA (T1c, N0, M0) non-small cell lung cancer, adenocarcinoma diagnosed in December 2022 2) incidental finding of pulmonary embolus within the right middle lobar pulmonary artery on CT scan of 01/13/2022  PRIOR THERAPY: 1) Status post left lower lobectomy with lymph node dissection under the care of Dr. Kerrin on May 30, 2021. 2) Eliquis  5 mg p.o. twice daily started January 13, 2022.  She will complete 6 months of her treatment this month.  CURRENT THERAPY: Observation   INTERVAL HISTORY: Laura [name] 78 y.o. female returns to the clinic today for a follow-up visit.  The patient was last seen in the clinic in July 2025 by Dr. Sherrod.  She had a restaging CT scan at that time which was negative for metastatic disease. She is found to have very mild microcytic anemia which she is wondering if that is contributing to her fatigue and lack of energy.  Therefore she is here today to obtain iron studies and determine if she needs iron infusions.  She is concerned about taking iron supplements because she already struggles with constipation and pelvic floor dysfunction. She is not interested in trying lower content iron or prescription iron due to fear of it exacerbating her chronic abdominal discomfort. She reports bloating. It sounds like she was treated for H. Pylori but her symptoms persisted. She takes protonix .   She reports she had anemia also when she was a child and had to take what she believed to be iron pills.  She has never needed a blood transfusion or iron infusion.  She experiences chronic fatigue and lack of energy, with recent episodes of feeling 'yucky' and lightheadedness, particularly over the past weekend. No visible bleeding, such as epistaxis, gum bleeding, or hematochezia or hematuria.  Her recent colonoscopy and endoscopy  revealed four polyps, which were removed. She has a history of non-bleeding internal hemorrhoids seen on 05/2023 colonoscopy, but they are not currently bleeding. Her diet is low in red meat and green leafy vegetables, which are high in iron.  She experiences dyspnea on exertion and is unsure of the cause. She has a history of back issues and knee problems, treated with injections without significant relief. She is currently seeing a neurologist for neuropathy in her feet, affecting her balance.  She is here today for evaluation repeat blood work   MEDICAL HISTORY: Past Medical History:  Diagnosis Date   Anemia    Anxiety    Asthma    related to sesonal allergies   Cancer (HCC)    Lung   Cataract    Chronic combined systolic and diastolic CHF, NYHA class 2 (HCC) CARDIOLOGIST-  DR RMNPUNML   Coronary artery disease    Depression    History of colon polyps 07/2019   tubular adenoma - Dr. Luis   History of kidney stones    History of non-ST elevation myocardial infarction (NSTEMI) 11/27/2009   SECONDARY TO TAKOTSUDO SYNDROME (CARDIAC CATH NORMAL)   Hyperlipemia    Hypertension    Hypoglycemia    Hypothyroidism    LBBB (left bundle branch block)    Left ventricular ejection fraction less than 40%    38% PER CARDIOLOGIST NOTE (DR CROITORU)   Memory loss    Mood swings    Myocardial infarction (HCC) 2011   Nonischemic dilated cardiomyopathy (HCC)    MODERATELY DEPRESSED LVF;EF  35-45% by Echo 05/27/11   OSA (obstructive sleep apnea) MODERATE PER STUDY 2005   CPAP NONCOMPLIANT   Pre-diabetes    Pulmonary embolism (HCC)    after the lobectomy   Seasonal allergies    Sleep apnea    SUI (stress urinary incontinence, female)     ALLERGIES:  is allergic to codeine, ciprofloxacin, ace inhibitors, citalopram , fetzima [levomilnacipran], lasix [furosemide], linzess [linaclotide], nsaids, sulfamethoxazole-trimethoprim, and xanax xr [alprazolam er].  MEDICATIONS:  Current Outpatient  Medications  Medication Sig Dispense Refill   atorvastatin  (LIPITOR ) 80 MG tablet TAKE 1/2 TABLET BY MOUTH DAILY FOR CHOLESTEROL 45 tablet 3   cyclobenzaprine  (FLEXERIL ) 5 MG tablet Take 1 tablet (5 mg total) by mouth 3 (three) times daily as needed for muscle spasms. 60 tablet 5   diazepam  (VALIUM ) 10 MG tablet Take 10 mg by mouth every 6 (six) hours as needed for anxiety.     DULoxetine  (CYMBALTA ) 60 MG capsule Take 1 capsule by mouth daily.     hydrALAZINE  (APRESOLINE ) 50 MG tablet TAKE 1 TABLET BY MOUTH TWICE A DAY (Patient taking differently: Take 75 mg by mouth 1 day or 1 dose.) 180 tablet 2   irbesartan  (AVAPRO ) 300 MG tablet Take 300 mg by mouth daily.     levothyroxine  (SYNTHROID , LEVOTHROID) 88 MCG tablet Take 88 mcg by mouth daily before breakfast.     Melatonin 10 MG TABS Take by mouth at bedtime as needed. Pt taking 21/2 mg a day     metoprolol  succinate (TOPROL -XL) 50 MG 24 hr tablet TAKE ONE AND ONE-HALF TABLETS BY MOUTH DAILY (Patient taking differently: Take 75 mg by mouth daily.) 90 tablet 3   NUVIGIL  250 MG tablet Take 250 mg by mouth daily as needed (energy boost).  3   pantoprazole  (PROTONIX ) 40 MG tablet Take 1 tablet (40 mg total) by mouth daily. 90 tablet 1   polyethylene glycol (MIRALAX ) 17 g packet Take 17 g by mouth 3 (three) times daily.     Current Facility-Administered Medications  Medication Dose Route Frequency Provider Last Rate Last Admin   0.9 %  sodium chloride  infusion  500 mL Intravenous Once Federico Rosario BROCKS, Laura [name]        SURGICAL HISTORY:  Past Surgical History:  Procedure Laterality Date   CARDIAC CATHETERIZATION  09-04-1999;  08/25/2004;   12/09/2009  DR FRANCYNE   NORMAL CORONARIES/  APICAL BALLOONING OF LV CONSISTENT WITH TAKOTSUBO SYMPTOMS/ EF 30-35%   CATARACT EXTRACTION W/ INTRAOCULAR LENS  IMPLANT, BILATERAL     CHOLECYSTECTOMY N/A 09/29/2012   Procedure: LAPAROSCOPIC CHOLECYSTECTOMY WITH INTRAOPERATIVE CHOLANGIOGRAM;  Surgeon: Elspeth KYM Schultze, Laura [name];   Location: [facility] OR;  Service: General;  Laterality: N/A;   COLONOSCOPY  07/2020   CYSTOSCOPY N/A 09/19/2012   Procedure: PHYLLIS SIDE;  Surgeon: Alm GORMAN Fragmin, Laura [name];  Location: [facility];  Service: Urology;  Laterality: N/A;   DILATION AND CURETTAGE OF UTERUS     EYE SURGERY Bilateral    cataract removal   INTERCOSTAL NERVE BLOCK  05/30/2021   Procedure: INTERCOSTAL NERVE BLOCK;  Surgeon: Kerrin Elspeth BROCKS, Laura [name];  Location: [facility] OR;  Service: Thoracic;;   KNEE ARTHROSCOPY W/ MENISCECTOMY Right 07/27/2011   MEDIAL AND LATERAL   LOBECTOMY  05/30/2021   Procedure: LEFT LOWER LOBECTOMY;  Surgeon: Kerrin Elspeth BROCKS, Laura [name];  Location: [facility] OR;  Service: Thoracic;;   LYMPH NODE DISSECTION  05/30/2021   Procedure: LYMPH NODE DISSECTION;  Surgeon: Kerrin Elspeth BROCKS, Laura [name];  Location: [facility] OR;  Service: Thoracic;;   NASAL SEPTUM SURGERY  02/28/1979   PUBOVAGINAL SLING N/A 09/19/2012   Procedure: SUBURETHRAL OZIE GLADE;  Surgeon: Alm GORMAN Fragmin, Laura [name];  Location: [facility];  Service: Urology;  Laterality: N/A;   RIGHT URETEROSCOPIC STONE EXTRACTION  08/31/2000   TRANSTHORACIC ECHOCARDIOGRAM  05-27-2011  DR CROITORU   MODERATELY DEPRESSED LVF DUE TO GLOBAL HYPOKINESIS AND MARKED SYSTOLIC ASYNCHRONY/ EF 38%/ MILD LEFT ATRIAL DILATATION   VAGINAL HYSTERECTOMY  06/30/1983   partial    REVIEW OF SYSTEMS:   Review of Systems  Constitutional: Positive for fatigue. Negative for appetite change, chills, fever and unexpected weight change.  HENT: Negative for mouth sores, nosebleeds, sore throat and trouble swallowing.   Eyes: Negative for eye problems and icterus.  Respiratory: Positive for dyspnea on exertion. Negative for cough, hemoptysis,  and wheezing.   Cardiovascular: Negative for chest pain and leg swelling.  Gastrointestinal: positive for bloating and abdominal discomfort. Negative for diarrhea, nausea and vomiting.  Genitourinary: Negative for bladder  incontinence, difficulty urinating, dysuria, frequency and hematuria.   Musculoskeletal: Positive for back pain and knee pain. Negative for neck pain and neck stiffness.  Skin: Negative for itching and rash.  Neurological: Negative for dizziness, extremity weakness, gait problem, headaches, light-headedness and seizures.  Hematological: Negative for adenopathy. Does not bruise/bleed easily.  Psychiatric/Behavioral: Negative for confusion, depression and sleep disturbance. The patient is not nervous/anxious.     PHYSICAL EXAMINATION:  There were no vitals taken for this visit.  ECOG PERFORMANCE STATUS: 1  Physical Exam  Constitutional: Oriented to person, place, and time and elderly appearing female and in no distress.  HENT:  Head: Normocephalic and atraumatic.  Mouth/Throat: Oropharynx is clear and moist. No oropharyngeal exudate.  Eyes: Conjunctivae are normal. Right eye exhibits no discharge. Left eye exhibits no discharge. No scleral icterus.  Neck: Normal range of motion. Neck supple.  Cardiovascular: Normal rate, regular rhythm, normal heart sounds and intact distal pulses.   Pulmonary/Chest: Effort normal and breath sounds normal. No respiratory distress. No wheezes. No rales.  Abdominal: Soft. Bowel sounds are normal. Exhibits no distension and no mass. There is no tenderness.  Musculoskeletal: Normal range of motion. Exhibits no edema.  Lymphadenopathy:    No cervical adenopathy.  Neurological: Alert and oriented to person, place, and time. Exhibits muscle wasting. Gait normal. Coordination normal.  Skin: Skin is warm and dry. No rash noted. Not diaphoretic. No erythema. No pallor.  Psychiatric: Mood, memory and judgment normal.  Vitals reviewed.  LABORATORY DATA: Lab Results  Component Value Date   WBC 6.6 01/12/2024   HGB 11.7 (L) 01/12/2024   HCT 36.9 01/12/2024   MCV 75.8 (L) 01/12/2024   PLT 247 01/12/2024      Chemistry      Component Value Date/Time   NA  140 01/12/2024 1127   NA 144 08/30/2020 1550   K 4.2 01/12/2024 1127   CL 106 01/12/2024 1127   CO2 29 01/12/2024 1127   BUN 15 01/12/2024 1127   BUN 10 08/30/2020 1550   CREATININE 0.73 01/12/2024 1127   CREATININE 0.75 05/15/2015 1306      Component Value Date/Time   CALCIUM  9.5 01/12/2024 1127   ALKPHOS 72 01/12/2024 1127   AST 15 01/12/2024 1127   ALT 10 01/12/2024 1127   BILITOT 0.3 01/12/2024 1127       RADIOGRAPHIC STUDIES:  DG Abd 2 Views Result Date: 01/30/2024 EXAM: 2 VIEW XRAY OF THE ABDOMEN 01/24/2024 03:22:32 PM COMPARISON:  None available. CLINICAL HISTORY: Constipation, Small intestinal bacterial overgrowth (SIBO). Chronic alternating diarrhea/constipation, lower abd pain, gas/bloating, nausea x 2 yrs, hx of IBS. FINDINGS: BOWEL: Nonobstructive bowel gas pattern. Moderate stool on the right colo. n. SOFT TISSUES: No abnormal calcifications or opaque urinary calculi. Cholecystectomy. BONES: No acute osseous abnormality. IMPRESSION: 1. No bowel obstruction. 2. Moderate stool in the right colon. Electronically signed by: Norman Gatlin Laura [name] 01/30/2024 02:15 AM EDT RP Workstation: [id number]     ASSESSMENT/PLAN:  This is a very pleasant 78 year old Caucasian female with a history of stage Ia (T1c, N0, M0) non-small cell lung cancer, adenocarcinoma.  She was diagnosed in December 2022.  She status post left lower lobectomy and lymph node dissection under the care of Dr. Kerrin which was performed on 05/30/2021.  She is on observation and feeling fine and her next restaging CT scan of the chest is expected to be performed in July 2026.  The patient was found to have mild microcytic anemia at her last appointment with a hemoglobin of 11.7.  Her MCV is low at 75.8.  The patient is concerned about taking iron supplements due to her pelvic floor dysfunction.  She repeat CBC performed today.  Her iron studies are pending. She also has B12 and folate testing pending.   We can  consider her IV iron to bolster her iron stores if she is unable to tolerate iron supplements if her iron is found to be low.  We will see her back in July 2026 with a restaging CT scan.  She will continue follow-up with GI regarding GERD and gastrointestinal complaints.  - Consider iron supplementation if tolerated, possibly in liquid or multivitamin form.  The patient was advised to call immediately if she has any concerning symptoms in the interval. The patient voices understanding of current disease status and treatment options and is in agreement with the current care plan. All questions were answered. The patient knows to call the clinic with any problems, questions or concerns. We can certainly see the patient much sooner if necessary  No orders of the defined types were placed in this encounter.    The total time spent in the appointment was 20-29 minutes  Ashlea Dusing L Tavone Caesar, PA-C 02/13/24

## 2024-02-15 ENCOUNTER — Other Ambulatory Visit: Payer: Self-pay | Admitting: Physician Assistant

## 2024-02-15 ENCOUNTER — Inpatient Hospital Stay: Attending: Internal Medicine

## 2024-02-15 ENCOUNTER — Inpatient Hospital Stay (HOSPITAL_BASED_OUTPATIENT_CLINIC_OR_DEPARTMENT_OTHER): Admitting: Physician Assistant

## 2024-02-15 VITALS — BP 173/82 | HR 104 | Temp 97.9°F | Resp 16 | Wt 162.5 lb

## 2024-02-15 DIAGNOSIS — C3432 Malignant neoplasm of lower lobe, left bronchus or lung: Secondary | ICD-10-CM | POA: Diagnosis present

## 2024-02-15 DIAGNOSIS — Z7901 Long term (current) use of anticoagulants: Secondary | ICD-10-CM | POA: Insufficient documentation

## 2024-02-15 DIAGNOSIS — D509 Iron deficiency anemia, unspecified: Secondary | ICD-10-CM | POA: Insufficient documentation

## 2024-02-15 DIAGNOSIS — D649 Anemia, unspecified: Secondary | ICD-10-CM | POA: Insufficient documentation

## 2024-02-15 DIAGNOSIS — Z86711 Personal history of pulmonary embolism: Secondary | ICD-10-CM | POA: Insufficient documentation

## 2024-02-15 DIAGNOSIS — E538 Deficiency of other specified B group vitamins: Secondary | ICD-10-CM | POA: Diagnosis not present

## 2024-02-15 DIAGNOSIS — Z902 Acquired absence of lung [part of]: Secondary | ICD-10-CM | POA: Insufficient documentation

## 2024-02-15 LAB — CBC WITH DIFFERENTIAL (CANCER CENTER ONLY)
Abs Immature Granulocytes: 0.02 K/uL (ref 0.00–0.07)
Basophils Absolute: 0.1 K/uL (ref 0.0–0.1)
Basophils Relative: 1 %
Eosinophils Absolute: 0.2 K/uL (ref 0.0–0.5)
Eosinophils Relative: 2 %
HCT: 36.8 % (ref 36.0–46.0)
Hemoglobin: 11.7 g/dL — ABNORMAL LOW (ref 12.0–15.0)
Immature Granulocytes: 0 %
Lymphocytes Relative: 23 %
Lymphs Abs: 1.6 K/uL (ref 0.7–4.0)
MCH: 24.3 pg — ABNORMAL LOW (ref 26.0–34.0)
MCHC: 31.8 g/dL (ref 30.0–36.0)
MCV: 76.3 fL — ABNORMAL LOW (ref 80.0–100.0)
Monocytes Absolute: 0.7 K/uL (ref 0.1–1.0)
Monocytes Relative: 10 %
Neutro Abs: 4.5 K/uL (ref 1.7–7.7)
Neutrophils Relative %: 64 %
Platelet Count: 231 K/uL (ref 150–400)
RBC: 4.82 MIL/uL (ref 3.87–5.11)
RDW: 15.2 % (ref 11.5–15.5)
WBC Count: 7 K/uL (ref 4.0–10.5)
nRBC: 0 % (ref 0.0–0.2)

## 2024-02-15 LAB — IRON AND IRON BINDING CAPACITY (CC-WL,HP ONLY)
Iron: 125 ug/dL (ref 28–170)
Saturation Ratios: 36 % — ABNORMAL HIGH (ref 10.4–31.8)
TIBC: 344 ug/dL (ref 250–450)
UIBC: 219 ug/dL (ref 148–442)

## 2024-02-15 LAB — FERRITIN: Ferritin: 18 ng/mL (ref 11–307)

## 2024-02-15 LAB — FOLATE: Folate: 27.4 ng/mL (ref >5.9)

## 2024-02-15 LAB — VITAMIN B12: Vitamin B-12: 172 pg/mL — ABNORMAL LOW (ref 180–914)

## 2024-02-16 ENCOUNTER — Ambulatory Visit: Payer: Self-pay | Admitting: *Deleted

## 2024-02-16 NOTE — Telephone Encounter (Signed)
-----   Message from Cassandra L Heilingoetter sent at 02/16/2024  9:57 AM EDT ----- Laura [name], can  you call her today and let her know her B12 was low? That might be contributing to her neuropathy. Can you call her and offer B12 injections weekly x4 and monthly there on after? If  she is agreeable, then let me know and I can order and arrange. Her iron surprisingly was not bad. Her storage was a little on the low end of normal but her iron studies were pretty good. I don't  think she needs IV iron.  ----- Message ----- From: Interface, Lab In [place] Sent: 02/15/2024   1:56 PM EDT To: Calton CROME Heilingoetter, PA-C

## 2024-02-16 NOTE — Telephone Encounter (Signed)
 PC to patient, informed her of Cassie's recommendation below, she is agreeable to B12 injections.  Informed her our scheduling department will be contacting her to schedule the injections.  She verbalizes understanding.

## 2024-02-17 ENCOUNTER — Other Ambulatory Visit: Payer: Self-pay | Admitting: Physician Assistant

## 2024-02-17 DIAGNOSIS — E538 Deficiency of other specified B group vitamins: Secondary | ICD-10-CM | POA: Insufficient documentation

## 2024-02-18 ENCOUNTER — Telehealth: Payer: Self-pay | Admitting: Medical Oncology

## 2024-02-18 NOTE — Telephone Encounter (Signed)
 Pt asking for injection appointments ( B 12) . I told her the scheduler will call her next week.

## 2024-02-21 ENCOUNTER — Telehealth: Payer: Self-pay | Admitting: Physician Assistant

## 2024-02-21 NOTE — Telephone Encounter (Signed)
Scheduled appointment with the patient.

## 2024-02-24 ENCOUNTER — Inpatient Hospital Stay

## 2024-02-24 ENCOUNTER — Other Ambulatory Visit: Payer: Self-pay | Admitting: Physician Assistant

## 2024-02-24 DIAGNOSIS — E538 Deficiency of other specified B group vitamins: Secondary | ICD-10-CM

## 2024-02-24 DIAGNOSIS — C3432 Malignant neoplasm of lower lobe, left bronchus or lung: Secondary | ICD-10-CM | POA: Diagnosis not present

## 2024-02-24 MED ORDER — CYANOCOBALAMIN 1000 MCG/ML IJ SOLN
1000.0000 ug | Freq: Once | INTRAMUSCULAR | Status: AC
  Administered 2024-02-24: 1000 ug via INTRAMUSCULAR
  Filled 2024-02-24: qty 1

## 2024-02-24 MED ORDER — CYANOCOBALAMIN 1000 MCG/ML IJ SOLN: 1000.0000 ug | Freq: Once | INTRAMUSCULAR | Status: DC

## 2024-02-24 NOTE — Patient Instructions (Signed)
 Vitamin B12 Injection What is this medication? Vitamin B12 (VAHY tuh min B12) prevents and treats low vitamin B12 levels in your body. It is used in people who do not get enough vitamin B12 from their diet or when their digestive tract does not absorb enough. Vitamin B12 plays an important role in maintaining the health of your nervous system and red blood cells. This medicine may be used for other purposes; ask your health care provider or pharmacist if you have questions. COMMON BRAND NAME(S): B-12 Compliance Kit, B-12 Injection Kit, Cyomin, Dodex , LA-12, Nutri-Twelve, Physicians EZ Use B-12, Primabalt, Vitamin Deficiency Injectable System - B12 What should I tell my care team before I take this medication? They need to know if you have any of these conditions: Kidney disease Leber's disease Megaloblastic anemia An unusual or allergic reaction to cyanocobalamin , cobalt, other medications, foods, dyes, or preservatives Pregnant or trying to get pregnant Breast-feeding How should I use this medication? This medication is injected into a muscle or deeply under the skin. It is usually given in a clinic or care team's office. However, your care team may teach you how to inject yourself. Follow all instructions. Talk to your care team about the use of this medication in children. Special care may be needed. Overdosage: If you think you have taken too much of this medicine contact a poison control center or emergency room at once. NOTE: This medicine is only for you. Do not share this medicine with others. What if I miss a dose? If you are given your dose at a clinic or care team's office, call to reschedule your appointment. If you give your own injections, and you miss a dose, take it as soon as you can. If it is almost time for your next dose, take only that dose. Do not take double or extra doses. What may interact with this medication? Alcohol Colchicine This list may not describe all possible  interactions. Give your health care provider a list of all the medicines, herbs, non-prescription drugs, or dietary supplements you use. Also tell them if you smoke, drink alcohol, or use illegal drugs. Some items may interact with your medicine. What should I watch for while using this medication? Visit your care team regularly. You may need blood work done while you are taking this medication. You may need to follow a special diet. Talk to your care team. Limit your alcohol intake and avoid smoking to get the best benefit. What side effects may I notice from receiving this medication? Side effects that you should report to your care team as soon as possible: Allergic reactions--skin rash, itching, hives, swelling of the face, lips, tongue, or throat Swelling of the ankles, hands, or feet Trouble breathing Side effects that usually do not require medical attention (report to your care team if they continue or are bothersome): Diarrhea This list may not describe all possible side effects. Call your doctor for medical advice about side effects. You may report side effects to FDA at 1-800-FDA-1088. Where should I keep my medication? Keep out of the reach of children. Store at room temperature between 15 and 30 degrees C (59 and 85 degrees F). Protect from light. Throw away any unused medication after the expiration date. NOTE: This sheet is a summary. It may not cover all possible information. If you have questions about this medicine, talk to your doctor, pharmacist, or health care provider.  2024 Elsevier/Gold Standard (2021-02-25 00:00:00)

## 2024-02-25 ENCOUNTER — Telehealth: Payer: Self-pay | Admitting: Medical Oncology

## 2024-02-25 NOTE — Telephone Encounter (Addendum)
 Itching on left ankle  started last night and swelling. Today the itching is gone ,but the swelling is present . Her shoe felt a little tight today . I told her to monitor her symptoms and if she experiences any chest pain , sob other sweling to go to ED.  She wondered if it was related to the b 12 injection. I told her probably not.

## 2024-03-02 ENCOUNTER — Inpatient Hospital Stay: Attending: Internal Medicine

## 2024-03-02 DIAGNOSIS — E538 Deficiency of other specified B group vitamins: Secondary | ICD-10-CM | POA: Insufficient documentation

## 2024-03-02 MED ORDER — CYANOCOBALAMIN 1000 MCG/ML IJ SOLN
1000.0000 ug | Freq: Once | INTRAMUSCULAR | Status: AC
  Administered 2024-03-02: 1000 ug via INTRAMUSCULAR
  Filled 2024-03-02: qty 1

## 2024-03-07 ENCOUNTER — Ambulatory Visit (INDEPENDENT_AMBULATORY_CARE_PROVIDER_SITE_OTHER): Admitting: Obstetrics and Gynecology

## 2024-03-07 VITALS — BP 110/71 | HR 87

## 2024-03-07 DIAGNOSIS — R350 Frequency of micturition: Secondary | ICD-10-CM | POA: Diagnosis not present

## 2024-03-07 MED ORDER — VIBEGRON 75 MG PO TABS: 75.0000 mg | ORAL_TABLET | Freq: Every day | ORAL | 5 refills | Status: DC

## 2024-03-07 NOTE — Progress Notes (Signed)
 [facility] Return Visit  SUBJECTIVE  History of Present Illness: Laura [name] is a 78 y.o. female seen in follow-up for OAB/Prolapse. Patient reports she recently has had an issue with urinary frequency and urgency. Patient reports she is taking miralax  half dose daily which has helped with her constipation symptoms, but she still feels constantly bloated.   Patient is seeing Dr. Abigail for pain management and he is planning possible intervention with fluoroscopy to support her in her pain management.   Patient also endorses knee pain and a sensation of paper on the bottom of her feet.     Past Medical History: Patient  has a past medical history of Anemia, Anxiety, Asthma, Cancer (HCC), Cataract, Chronic combined systolic and diastolic CHF, NYHA class 2 (HCC) (CARDIOLOGIST-  DR RMNPUNML), Coronary artery disease, Depression, History of colon polyps (07/2019), History of kidney stones, History of non-ST elevation myocardial infarction (NSTEMI) (11/27/2009), Hyperlipemia, Hypertension, Hypoglycemia, Hypothyroidism, LBBB (left bundle branch block), Left ventricular ejection fraction less than 40%, Memory loss, Mood swings, Myocardial infarction (HCC) (2011), Nonischemic dilated cardiomyopathy (HCC), OSA (obstructive sleep apnea) (MODERATE PER STUDY 2005), Pre-diabetes, Pulmonary embolism (HCC), Seasonal allergies, Sleep apnea, and SUI (stress urinary incontinence, female).   Past Surgical History: She  has a past surgical history that includes Nasal septum surgery (02/28/1979); Vaginal hysterectomy (06/30/1983); Cataract extraction w/ intraocular lens  implant, bilateral; RIGHT URETEROSCOPIC STONE EXTRACTION (08/31/2000); Knee arthroscopy w/ meniscectomy (Right, 07/27/2011); transthoracic echocardiogram (05-27-2011  DR CROITORU); Cardiac catheterization (09-04-1999;  08/25/2004;   12/09/2009  DR CROITORU); Pubovaginal sling (N/A, 09/19/2012); Cystoscopy (N/A, 09/19/2012);  Cholecystectomy (N/A, 09/29/2012); Colonoscopy (07/2020); Eye surgery (Bilateral); Dilation and curettage of uterus; Lobectomy (05/30/2021); Intercostal nerve block (05/30/2021); and Lymph node dissection (05/30/2021).   Medications: She has a current medication list which includes the following prescription(s): atorvastatin , cyclobenzaprine , diazepam , duloxetine , hydralazine , irbesartan , levothyroxine , melatonin, metoprolol  succinate, nuvigil , pantoprazole , polyethylene glycol, and vibegron , and the following Facility-Administered Medications: sodium chloride .   Allergies: Patient is allergic to codeine, ciprofloxacin, ace inhibitors, citalopram , fetzima [levomilnacipran], lasix [furosemide], linzess [linaclotide], nsaids, sulfamethoxazole-trimethoprim, and xanax xr [alprazolam er].   Social History: Patient  reports that she has never smoked. She has never used smokeless tobacco. She reports that she does not drink alcohol and does not use drugs.     OBJECTIVE     Physical Exam: Vitals:   03/07/24 1506  BP: 110/71  Pulse: 87   Gen: No apparent distress, A&O x 3.  Detailed Urogynecologic Evaluation:  Deferred. Pessary checked with OB-GYN.   ASSESSMENT AND PLAN    Laura [name] is a 78 y.o. with:  1. Urinary frequency    While we discussed I cannot assist in her back pain or knee pain, we did talk about assistance with her feeling of urgency and frequency. We can try a beta 3 agonist (Gemtesa  75mg  daily) to see if this helps some of her bladder symptoms. She is not a good candidate for anticholinergics due to age and her chronic constipation issues. She is not a good candidate for Myrbetriq related to her use of metoprolol .   At patient's request, I called and left a message for Dr. Carilyn office. I did inform patient I had no sway over their schedule but she reports she is desperate for relief. I called and left a brief message that if they had a cancellation the patient would  really appreciate a sooner appointment.   Patient to return in 4 weeks for med check or sooner if needed.  Laura Pelham G Murdis Flitton, NP

## 2024-03-09 ENCOUNTER — Inpatient Hospital Stay

## 2024-03-09 ENCOUNTER — Encounter: Payer: Self-pay | Admitting: Medical Oncology

## 2024-03-09 DIAGNOSIS — E538 Deficiency of other specified B group vitamins: Secondary | ICD-10-CM

## 2024-03-09 MED ORDER — CYANOCOBALAMIN 1000 MCG/ML IJ SOLN
1000.0000 ug | Freq: Once | INTRAMUSCULAR | Status: DC
  Filled 2024-03-09: qty 1

## 2024-03-09 NOTE — Progress Notes (Unsigned)
 Pt stated pt can take vitamin b 12 by mouth daily and stop injections.

## 2024-03-10 ENCOUNTER — Encounter: Payer: Self-pay | Admitting: Physician Assistant

## 2024-03-10 ENCOUNTER — Encounter: Payer: Self-pay | Admitting: Medical Oncology

## 2024-03-10 NOTE — Progress Notes (Signed)
 On yesterday patient was here for a B 12 injection.  States each time she received the injection it caused swelling in her ankles.  Secure chatted Dr. Sherrod, Diane/RN and Devan/pharmacist.  Devan stated it was up to the provider.  Went over to speak with Diane.  Dr. Sherrod came down the hall and was made aware of the patient's issues.  He stated ok to take B 12 over the counter.  Patient was informed of this.  Patient still continues to have concerns and questions.  Patient taken to Med Onc for Diane to address the other concerns that the patient had.

## 2024-03-10 NOTE — Progress Notes (Signed)
 Pt contacted and understands to take vitamin b 12 orally every day.

## 2024-03-10 NOTE — Addendum Note (Signed)
 Addended by: CAROLEE LOA DEL on: 03/10/2024 12:48 PM   Modules accepted: Orders

## 2024-03-16 ENCOUNTER — Ambulatory Visit

## 2024-03-24 ENCOUNTER — Encounter: Attending: Physical Medicine & Rehabilitation | Admitting: Physical Medicine & Rehabilitation

## 2024-03-24 ENCOUNTER — Encounter: Payer: Self-pay | Admitting: Physical Medicine & Rehabilitation

## 2024-03-24 DIAGNOSIS — M533 Sacrococcygeal disorders, not elsewhere classified: Secondary | ICD-10-CM | POA: Insufficient documentation

## 2024-03-24 MED ORDER — LIDOCAINE HCL (PF) 2 % IJ SOLN: 2.0000 mL | Freq: Once | INTRAMUSCULAR | Status: AC

## 2024-03-24 MED ORDER — BETAMETHASONE SOD PHOS & ACET 6 (3-3) MG/ML IJ SUSP: 6.0000 mg | Freq: Once | INTRAMUSCULAR | Status: DC

## 2024-03-24 MED ORDER — LIDOCAINE HCL 1 % IJ SOLN: 5.0000 mL | Freq: Once | INTRAMUSCULAR | Status: AC

## 2024-03-24 MED ORDER — IOHEXOL 180 MG/ML  SOLN: 2.0000 mL | Freq: Once | INTRAMUSCULAR | Status: AC

## 2024-03-24 NOTE — Progress Notes (Signed)
  PROCEDURE RECORD [facility]   Name: Laura [name] DOB:Dec 24, 1945 MRN: [id number]  Date:03/24/2024  Physician: Prentice Compton, MD    Nurse/CMA: Jama, CMA  Allergies:  Allergies  Allergen Reactions   Codeine Anaphylaxis, Hives and Other (See Comments)    Headache. Daughter reported that it caused her throat to swell up    Ciprofloxacin Nausea And Vomiting   Cyanocobalamin  Swelling    Lower extremity swelling   Vortioxetine Nausea Only    Other Reaction(s): dizziness   Ace Inhibitors Other (See Comments)    Unknown- it didn't agree with her    Citalopram  Other (See Comments)    Fatigue   Fetzima [Levomilnacipran] Other (See Comments)    Talking out of my head   Lasix [Furosemide] Other (See Comments)    HEADACHE   Linzess [Linaclotide] Nausea Only   Nsaids Other (See Comments)    Told not to take NSAIDs because of her heart    Sulfamethoxazole-Trimethoprim Other (See Comments)    NERVOUS AND DISORENTED   Xanax [place]] Other (See Comments)    confusion    Consent Signed: Yes.    Is patient diabetic? No.  CBG today? .  Pregnant: No. LMP: No LMP recorded. Patient is postmenopausal. (age 78-55)  Anticoagulants: no Anti-inflammatory: no Antibiotics: no  Procedure: Bilateral Sacroiliac Injection  Position: Prone Start Time: 11:10 am  End Time: 11:16 am  Fluoro Time: 26  RN/CMA Jama, CMA Antonia Culbertson, CMA    Time 10:50 am 11:21 am    BP 154/74 149/77    Pulse 78 74    Respirations 16 16    O2 Sat 97 97    S/S 6 6    Pain Scale 9/10 6/10     D/C home with husband, patient A & O X 3, D/C instructions reviewed, and sits independently.

## 2024-03-24 NOTE — Patient Instructions (Signed)
Sacroiliac injection was performed today. A combination of numbing medicine (lidocaine) plus a cortisone medicine (betamethasone) was injected. The injection was done under x-ray guidance. This procedure has been performed to help reduce low back and buttocks pain as well as potentially hip pain. The duration of this injection is variable lasting from hours to  Months. It may repeated if needed. 

## 2024-03-24 NOTE — Progress Notes (Signed)
Bilateral sacroiliac injections under fluoroscopic guidance  Indication: Low back and buttocks pain not relieved by medication management and other conservative care.  Informed consent was obtained after describing risks and benefits of the procedure with the patient, this includes bleeding, bruising, infection, paralysis and medication side effects. The patient wishes to proceed and has given written consent. The patient was placed in a prone position. The lumbar and sacral area was marked and prepped with Betadine. A 25-gauge 1-1/2 inch needle was inserted into the skin and subcutaneous tissue and 1 mL of 1% lidocaine was injected into each side. Then a 25-gauge 3 inch spinal needle was inserted under fluoroscopic guidance into the left sacroiliac joint. AP and lateral images were utilized. Omnipaque 180x0.5 mL under live fluoroscopy demonstrated no intravascular uptake. Then a solution containing one ML of 6 mg per mL Celestone in 2 ML of 2% lidocaine MPF was injected x1.5 mL. This same procedure was repeated on the right side using the same needle, injectate, and technique. Patient tolerated the procedure well. Post procedure instructions were given. Please see post procedure form.  Meds: Celestone 6mg  Omnipaque 1ml Lidocaine 1% 2ml Lidocaine 2% PF 2 ml

## 2024-03-28 ENCOUNTER — Encounter: Payer: Self-pay | Admitting: Cardiovascular Disease

## 2024-03-28 ENCOUNTER — Ambulatory Visit: Attending: Cardiovascular Disease | Admitting: Cardiovascular Disease

## 2024-03-28 VITALS — BP 142/80 | HR 83 | Ht 63.0 in | Wt 169.0 lb

## 2024-03-28 DIAGNOSIS — E039 Hypothyroidism, unspecified: Secondary | ICD-10-CM

## 2024-03-28 DIAGNOSIS — C3432 Malignant neoplasm of lower lobe, left bronchus or lung: Secondary | ICD-10-CM

## 2024-03-28 DIAGNOSIS — I493 Ventricular premature depolarization: Secondary | ICD-10-CM

## 2024-03-28 DIAGNOSIS — E785 Hyperlipidemia, unspecified: Secondary | ICD-10-CM | POA: Diagnosis present

## 2024-03-28 DIAGNOSIS — I447 Left bundle-branch block, unspecified: Secondary | ICD-10-CM

## 2024-03-28 DIAGNOSIS — Z86711 Personal history of pulmonary embolism: Secondary | ICD-10-CM

## 2024-03-28 DIAGNOSIS — D519 Vitamin B12 deficiency anemia, unspecified: Secondary | ICD-10-CM

## 2024-03-28 DIAGNOSIS — I428 Other cardiomyopathies: Secondary | ICD-10-CM | POA: Diagnosis not present

## 2024-03-28 DIAGNOSIS — R7303 Prediabetes: Secondary | ICD-10-CM

## 2024-03-28 DIAGNOSIS — G4733 Obstructive sleep apnea (adult) (pediatric): Secondary | ICD-10-CM

## 2024-03-28 DIAGNOSIS — I10 Essential (primary) hypertension: Secondary | ICD-10-CM | POA: Diagnosis present

## 2024-03-28 DIAGNOSIS — Z79899 Other long term (current) drug therapy: Secondary | ICD-10-CM | POA: Insufficient documentation

## 2024-03-28 DIAGNOSIS — E782 Mixed hyperlipidemia: Secondary | ICD-10-CM

## 2024-03-28 MED ORDER — AMLODIPINE BESYLATE 5 MG PO TABS: 5.0000 mg | ORAL_TABLET | Freq: Every day | ORAL | 3 refills | Status: AC

## 2024-03-28 NOTE — Patient Instructions (Signed)
 Medication Instructions:  Stop Hydralazine  Start Amlodipine  5 mg daily *If you need a refill on your cardiac medications before your next appointment, please call your pharmacy*  Lab Work: None ordered If you have labs (blood work) drawn today and your tests are completely normal, you will receive your results only by: MyChart Message (if you have MyChart) OR A paper copy in the mail If you have any lab test that is abnormal or we need to change your treatment, we will call you to review the results.  Testing/Procedures: None ordered  Follow-Up: At [facility], you and your health needs are our priority.  As part of our continuing mission to provide you with exceptional heart care, our providers are all part of one team.  This team includes your primary Cardiologist (physician) and Advanced Practice Providers or APPs (Physician Assistants and Nurse Practitioners) who all work together to provide you with the care you need, when you need it.  Your next appointment:   Pharm D- in 1 month- (medication management)  Dr Francyne- 6 months  We recommend signing up for the patient portal called MyChart.  Sign up information is provided on this After Visit Summary.  MyChart is used to connect with patients for Virtual Visits (Telemedicine).  Patients are able to view lab/test results, encounter notes, upcoming appointments, etc.  Non-urgent messages can be sent to your provider as well.   To learn more about what you can do with MyChart, go to [email or web address].

## 2024-03-28 NOTE — Progress Notes (Unsigned)
 Office Visit    Patient Name: Laura [name] Date of Encounter: 03/28/2024  Primary Care Provider:  [name], MD Primary Cardiologist:  Debby Sor, MD (Inactive)  Chief Complaint    Hypertension  Significant Past Medical History                    Allergies  Allergen Reactions   Codeine Anaphylaxis, Hives and Other (See Comments)    Headache. Daughter reported that it caused her throat to swell up    Ciprofloxacin Nausea And Vomiting   Cyanocobalamin  Swelling    Lower extremity swelling   Vortioxetine Nausea Only    Other Reaction(s): dizziness   Ace Inhibitors Other (See Comments)    Unknown- it didn't agree with her    Citalopram  Other (See Comments)    Fatigue   Fetzima [Levomilnacipran] Other (See Comments)    Talking out of my head   Lasix [Furosemide] Other (See Comments)    HEADACHE   Linzess [Linaclotide] Nausea Only   Nsaids Other (See Comments)    Told not to take NSAIDs because of her heart    Sulfamethoxazole-Trimethoprim Other (See Comments)    NERVOUS AND DISORENTED   Xanax [place]] Other (See Comments)    confusion    History of Present Illness    Laura [name] is a 78 y.o. female patient of Dr Francyne, in the office today for hypertension evaluation.   My medicine is making me sick  Blood Pressure Goal:  130/80  Current Medications:    Previously tried:    Family Hx:     Social Hx:      Tobacco:  Alcohol:  Caffeine: Diet:      Exercise:   Home BP readings:      Adherence Assessment  Do you ever forget to take your medication? [] Yes [] No  Do you ever skip doses due to side effects? [] Yes [] No  Do you have trouble affording your medicines? [] Yes [] No  Are you ever unable to pick up your medication due to transportation difficulties? [] Yes [] No  Do you ever stop taking your medications because you don't believe they are helping? [] Yes [] No  Do you check your weight daily? [] Yes [] No    Adherence strategy: ***  Barriers to obtaining medications: ***     Accessory Clinical Findings    Lab Results  Component Value Date   CREATININE 0.73 01/12/2024   BUN 15 01/12/2024   NA 140 01/12/2024   K 4.2 01/12/2024   CL 106 01/12/2024   CO2 29 01/12/2024   Lab Results  Component Value Date   ALT 10 01/12/2024   AST 15 01/12/2024   ALKPHOS 72 01/12/2024   BILITOT 0.3 01/12/2024   Lab Results  Component Value Date   HGBA1C 6.0 (H) 12/19/2018    Home Medications    Current Outpatient Medications  Medication Sig Dispense Refill   amLODipine  (NORVASC ) 5 MG tablet Take 1 tablet (5 mg total) by mouth daily. 90 tablet 3   atorvastatin  (LIPITOR ) 80 MG tablet TAKE 1/2 TABLET BY MOUTH DAILY FOR CHOLESTEROL 45 tablet 3   diazepam  (VALIUM ) 10 MG tablet Take 10 mg by mouth every 6 (six) hours as needed for anxiety.     DULoxetine  (CYMBALTA ) 60 MG capsule Take 1 capsule by mouth daily.     irbesartan  (AVAPRO ) 300 MG tablet Take 300 mg by mouth daily.     levothyroxine  (SYNTHROID , LEVOTHROID) 88 MCG tablet Take  88 mcg by mouth daily before breakfast.     Melatonin 10 MG TABS Take by mouth at bedtime as needed. Pt taking 21/2 mg a day     metoprolol  succinate (TOPROL -XL) 50 MG 24 hr tablet TAKE ONE AND ONE-HALF TABLETS BY MOUTH DAILY (Patient taking differently: Take 75 mg by mouth daily.) 90 tablet 3   NUVIGIL  250 MG tablet Take 250 mg by mouth daily as needed (energy boost).  3   polyethylene glycol (MIRALAX ) 17 g packet Take 17 g by mouth 3 (three) times daily.     Current Facility-Administered Medications  Medication Dose Route Frequency Provider Last Rate Last Admin   0.9 %  sodium chloride  infusion  500 mL Intravenous Once Dorsey, Ying C, MD       betamethasone  acetate-betamethasone  sodium phosphate  (CELESTONE ) injection 6 mg  6 mg Intramuscular Once        iohexol  (OMNIPAQUE ) 180 MG/ML injection 2 mL  2 mL Other Once        lidocaine  (XYLOCAINE ) 1 % (with pres)  injection 5 mL  5 mL Other Once        lidocaine  HCl (PF) (XYLOCAINE ) 2 % injection 2 mL  2 mL Other Once              Assessment & Plan    No problem-specific Assessment & Plan notes found for this encounter.   Antoniette Peake PharmD CPP [facility]  [address] [place], [place] [address] [phone]

## 2024-03-29 ENCOUNTER — Ambulatory Visit: Attending: Internal Medicine | Admitting: Pharmacist Clinician (PhC)/ Clinical Pharmacy Specialist

## 2024-03-29 DIAGNOSIS — I10 Essential (primary) hypertension: Secondary | ICD-10-CM | POA: Insufficient documentation

## 2024-03-29 DIAGNOSIS — I429 Cardiomyopathy, unspecified: Secondary | ICD-10-CM | POA: Insufficient documentation

## 2024-03-29 DIAGNOSIS — E782 Mixed hyperlipidemia: Secondary | ICD-10-CM | POA: Diagnosis present

## 2024-03-29 MED ORDER — ATORVASTATIN CALCIUM 40 MG PO TABS: 40.0000 mg | ORAL_TABLET | Freq: Every day | ORAL | Status: AC

## 2024-03-29 NOTE — Patient Instructions (Signed)
 Follow up appointment: Wednesday Nov 5 at 2:45 pm  Take your BP meds as follows:  continue with irbesartan , amlodipine  and metoprolol    Check your blood pressure at home twice daily at least 3 times a week and keep record of the readings.  Your blood pressure goal is < 130/80  To check your pressure at home you will need to:  1. Sit up in a chair, with feet flat on the floor and back supported. Do not cross your ankles or legs. 2. Rest your left arm so that the cuff is about heart level. If the cuff goes on your upper arm,  then just relax the arm on the table, arm of the chair or your lap. If you have a wrist cuff, we  suggest relaxing your wrist against your chest (think of it as Pledging the Flag with the  wrong arm).  3. Place the cuff snugly around your arm, about 1 inch above the crook of your elbow. The  cords should be inside the groove of your elbow.  4. Sit quietly, with the cuff in place, for about 5 minutes. After that 5 minutes press the power  button to start a reading. 5. Do not talk or move while the reading is taking place.  6. Record your readings on a sheet of paper. Although most cuffs have a memory, it is often  easier to see a pattern developing when the numbers are all in front of you.  7. You can repeat the reading after 1-3 minutes if it is recommended  Make sure your bladder is empty and you have not had caffeine or tobacco within the last 30 min  Always bring your blood pressure log with you to your appointments. If you have not brought your monitor in to be double checked for accuracy, please bring it to your next appointment.  You can find a list of quality blood pressure cuffs at [email or web address]  Important lifestyle changes to control high blood pressure  Intervention  Effect on the BP  Lose extra pounds and watch your waistline Weight loss is one of the most effective lifestyle changes for controlling blood pressure. If you're overweight or obese,  losing even a small amount of weight can help reduce blood pressure. Blood pressure might go down by about 1 millimeter of mercury (mm Hg) with each kilogram (about 2.2 pounds) of weight lost.  Exercise regularly As a general goal, aim for at least 30 minutes of moderate physical activity every day. Regular physical activity can lower high blood pressure by about 5 to 8 mm Hg.  Eat a healthy diet Eating a diet rich in whole grains, fruits, vegetables, and low-fat dairy products and low in saturated fat and cholesterol. A healthy diet can lower high blood pressure by up to 11 mm Hg.  Reduce salt (sodium) in your diet Even a small reduction of sodium in the diet can improve heart health and reduce high blood pressure by about 5 to 6 mm Hg.  Limit alcohol One drink equals 12 ounces of beer, 5 ounces of wine, or 1.5 ounces of 80-proof liquor.  Limiting alcohol to less than one drink a day for women or two drinks a day for men can help lower blood pressure by about 4 mm Hg.   If you have any questions or concerns please use My Chart to send questions or call the office at [phone]

## 2024-03-30 ENCOUNTER — Encounter: Payer: Self-pay | Admitting: Pharmacist Clinician (PhC)/ Clinical Pharmacy Specialist

## 2024-03-30 NOTE — Assessment & Plan Note (Signed)
 Reviewed mechanism of action of irbesartan , metoprolol  and amlodipine .  Reviewed potential side effects and concerns.  Currently takes all three medications at the same time.    Asked that she check BP at home twice daily, at least 3 days per week.  Gave log for her to record this on and she is aware to bring home BP device to next visit for validation.

## 2024-03-30 NOTE — Assessment & Plan Note (Addendum)
 Reviewed mechanism of action of atorvastatin , side effects and potential LDL lowering.  Current LDL is 101.  Triglycerides WNL at 126.  Encouraged her to continue with atorvastatin  40 mg daily .  Will repeat labs after next visit to determine if further LDL lowering is needed.

## 2024-03-30 NOTE — Assessment & Plan Note (Signed)
 Reviewed diagnosis of cardiomyopathy/heart failure.  Explained EF with plastic heart model and answered all questions.  Reviewed GDMT and how her current medications are helping with this diagnosis.

## 2024-04-05 NOTE — Progress Notes (Signed)
 Cardiology Office Note   Date:  04/05/2024  ID:  Laura [name], DOB 20-Jun-1946, MRN [id number] PCP: [name], MD  [facility] Providers Cardiologist:  Jerel Balding, MD     History of Present Illness Laura [name] is a 78 y.o. female With longstanding LBBB and mild left ventricular systolic dysfunction due to nonischemic cardiomyopathy, HTN, OSA, history of lung cancer with surgical resection 2022, aortic atherosclerosis, history of pulmonary embolism.  This is the first time I have seen Laura [name].  She then saw Dr. Jacques Somerset, but more recently she has been following with Dr. Burnard since 2020, until his recent retirement.  Over the years estimation of left ventricular systolic function has waxed and waned.  In 2011 she had an episode of Takotsubo cardiomyopathy.  She had normal coronary arteries by angiography at that time.  Subsequent variations in LV function seem to have been related to the degree of success in controlling her blood pressure.  The most recent echocardiogram describes an EF of 40-45% in June 2025.  She underwent a nuclear stress test in 2023 that showed normal perfusion but did not report LVEF.  On a nuclear scintigram in 2018 EF was 43%.  All her studies show significant abnormal septal motion/dyssynchrony due to LBBB.  ECG shows a broad typical left bundle branch block with a QRS duration of 142 ms.  Her recent health problems have been mostly dominated by problems with lung cancer.  She underwent robotic assisted left lower lobectomy for stage Ia adenocarcinoma in December 2022 by Dr. Kerrin.  A follow-up CT evaluation in July 2023 showed no recurrence of disease but did show a small pulmonary embolism and she then took Eliquis  for 6 months.  She has not had problems with chest pain.  She does complain of shortness of breath, poor exercise tolerance, but abdominal complaint is of feeling tired all the time.  She does not  have lower extremity edema, orthopnea or PND.  Palpitations are not bothering her (although she has had frequent PVCs reported in the past, reported at approximately 10% on the monitor from 2020)  Blood pressure control over the years has been challenging due to numerous medication allergies or intolerances (ACE inhibitors did not agree with her, furosemide caused headache) .    Difficulty taking hydralazine  more than once a day.  Blood pressure today is borderline at 142/80 on a combination of hydralazine  50 mg prescribed twice daily, (but typically only is able to take once daily due to erratic sleep schedule) and irbesartan  300 mg daily and metoprolol  succinate 75 mg daily.  Sleep apnea therapy has also been a little challenging since she became intolerant to CPAP but she had been wearing an oral appliance until later she developed some teeth fractures and was unable to use the device.  A home sleep study in November 2020 showed moderate sleep apnea with an AHI of 27/hour and severe oxygen desaturation to a nadir of 74%.  A new CPAP machine was provided, but due to noncompliance this was retrieved by the DME company.  Her CPAP is not currently being treated.  Continues to have problems with fragmented sleep, daytime hypersomnolence, snoring.  She has had issues with a variety of joint problems including sacroiliac joint disease and has been seeing our physiatrist team.    Also has had issues with combined iron deficiency and B12 deficiency anemia, followed in the [facility] she last received a B12  infusion in September of this year.  Iron stores are recently normal with a transferrin saturation of 36% and vitamin B12 levels were slightly low before she received the latest infusion of B12 in September.  Most recent hemoglobin was borderline at 11.7 with slightly microcytic indices.  Previous workup with colonoscopy and upper endoscopy has shown minor sources of bleeding such as  internal hemorrhoids and small polyps, without any serious pathology.  ROS:   Studies Reviewed EKG Interpretation Date/Time:  Tuesday March 28 2024 13:21:39 EDT Ventricular Rate:  83 PR Interval:  142 QRS Duration:  142 QT Interval:  396 QTC Calculation: 465 R Axis:   -22  Text Interpretation: Normal sinus rhythm Left bundle branch block When compared with ECG of 05-Jul-2023 11:25, Sinus rhythm has replaced Wide QRS rhythm Confirmed by Georgena Weisheit ([id number]) on 03/28/2024 1:33:51 PM     Risk Assessment/Calculations        Physical Exam VS:  BP (!) 142/80   Pulse 83   Ht 5' 3 (1.6 m)   Wt 169 lb (76.7 kg)   SpO2 98%   BMI 29.94 kg/m        Wt Readings from Last 3 Encounters:  03/28/24 169 lb (76.7 kg)  02/15/24 162 lb 8 oz (73.7 kg)  01/28/24 163 lb 9.6 oz (74.2 kg)    GEN: Well nourished, well developed in no acute distress NECK: No JVD; No carotid bruits CARDIAC: RRR, normal S1, paradoxically split S2, faint aortic ejection murmur, no diastolic murmurs, rubs, gallops RESPIRATORY:  Clear to auscultation without rales, wheezing or rhonchi  ABDOMEN: Soft, non-tender, non-distended EXTREMITIES:  No edema; No deformity   ASSESSMENT AND PLAN Nonischemic cardiomyopathy: Without overt heart failure.  She had moderately depressed LV function when she initially presented with Takotsubo cardiomyopathy in 2011, but since then her ejection fraction has been usually estimated to be around 45%.  She has not had overt congestive heart failure and does not need regular diuretic therapy.  Cardiomyopathy seems to be due to longstanding hypertension and LBBB related dyssynchrony.  She is taking metoprolol  succinate and maximum dose ARB. HTN: Control is suboptimal.  Taking just once or twice daily hydralazine  will provide very volatile blood pressure control.  Prefer a longer acting agent such as amlodipine .  Start amlodipine  5 mg daily instead of hydralazine  and continue the irbesartan   and metoprolol . PVC: These were quite frequent in the past with an overall burden of 10% on a monitor performed in 2020.  Probably not frequent enough to actually cause PVC cardiomyopathy and more likely an expression of LV dysfunction and untreated sleep apnea.  On beta-blockers and palpitations do not bother her. LBBB: EF is not low enough and heart failure is not symptomatic enough to justify aggressive treatment such as CRT-P OSA: Has chronic nonrestorative sleep, daytime hypersomnolence and fatigue.  She does not want to try CPAP again.  She had an oral appliance, but this no longer fits her. History of lung cancer: T1a N0 M0 adenocarcinoma s/p left lower lobectomy with curative intent in December 2022 and no evidence of recurrence since then. History of pulmonary embolism: Incidentally discovered on a CT evaluation after her lung surgery.  Completed 6 months of anticoagulation and is no longer taking anticoagulation. Anemia: Being treated for both iron deficiency anemia and B12 deficiency at the [facility]. Acquired hypothyroidism: Borderline TSH at 6.500 in May 2025, on levothyroxine  supplementation.  Clinically euthyroid. HLP: Most recent lipid profile is satisfactory for patient  without known CAD or PAD with LDL 101, HDL 66 and normal triglycerides.  Continue atorvastatin . PreDM: Most recent hemoglobin A1c was 6.1% in May 2025, without medication for diabetes.  Treat with diet and exercise.  If medications for diabetes become necessary, would prefer SGLT2 inhibitors since she also has cardiomyopathy.       Dispo:  Patient Instructions  Medication Instructions:  Stop Hydralazine  Start Amlodipine  5 mg daily *If you need a refill on your cardiac medications before your next appointment, please call your pharmacy*  Lab Work: None ordered If you have labs (blood work) drawn today and your tests are completely normal, you will receive your results only by: MyChart Message (if  you have MyChart) OR A paper copy in the mail If you have any lab test that is abnormal or we need to change your treatment, we will call you to review the results.  Testing/Procedures: None ordered  Follow-Up: At [facility], you and your health needs are our priority.  As part of our continuing mission to provide you with exceptional heart care, our providers are all part of one team.  This team includes your primary Cardiologist (physician) and Advanced Practice Providers or APPs (Physician Assistants and Nurse Practitioners) who all work together to provide you with the care you need, when you need it.  Your next appointment:   Pharm D- in 1 month- (medication management)  Dr Francyne- 6 months  We recommend signing up for the patient portal called MyChart.  Sign up information is provided on this After Visit Summary.  MyChart is used to connect with patients for Virtual Visits (Telemedicine).  Patients are able to view lab/test results, encounter notes, upcoming appointments, etc.  Non-urgent messages can be sent to your provider as well.   To learn more about what you can do with MyChart, go to [email or web address].      Signed, Jerel Francyne, MD

## 2024-04-11 ENCOUNTER — Ambulatory Visit: Admitting: Obstetrics and Gynecology

## 2024-04-13 ENCOUNTER — Inpatient Hospital Stay

## 2024-04-26 ENCOUNTER — Encounter: Payer: Self-pay | Admitting: Physician Assistant

## 2024-04-26 ENCOUNTER — Ambulatory Visit: Admitting: Physician Assistant

## 2024-04-26 VITALS — BP 136/80 | HR 71 | Ht 63.0 in | Wt 170.0 lb

## 2024-04-26 DIAGNOSIS — K219 Gastro-esophageal reflux disease without esophagitis: Secondary | ICD-10-CM

## 2024-04-26 DIAGNOSIS — D509 Iron deficiency anemia, unspecified: Secondary | ICD-10-CM | POA: Diagnosis not present

## 2024-04-26 DIAGNOSIS — K31A0 Gastric intestinal metaplasia, unspecified: Secondary | ICD-10-CM

## 2024-04-26 DIAGNOSIS — Z860100 Personal history of colon polyps, unspecified: Secondary | ICD-10-CM

## 2024-04-26 DIAGNOSIS — I428 Other cardiomyopathies: Secondary | ICD-10-CM

## 2024-04-26 DIAGNOSIS — K638219 Small intestinal bacterial overgrowth, unspecified: Secondary | ICD-10-CM | POA: Diagnosis not present

## 2024-04-26 DIAGNOSIS — K580 Irritable bowel syndrome with diarrhea: Secondary | ICD-10-CM

## 2024-04-26 MED ORDER — DOXYCYCLINE HYCLATE 100 MG PO TABS: 100.0000 mg | ORAL_TABLET | Freq: Two times a day (BID) | ORAL | 0 refills | Status: DC

## 2024-04-26 MED ORDER — PANTOPRAZOLE SODIUM 40 MG PO TBEC: 40.0000 mg | DELAYED_RELEASE_TABLET | Freq: Every day | ORAL | 3 refills | Status: AC

## 2024-04-26 NOTE — Progress Notes (Signed)
 "  Subjective:    Patient ID: Laura [name], female    DOB: 1946/06/11, 78 y.o.   MRN: [id number]  HPI Discussed the use of AI scribe software for clinical note transcription with the patient, who gave verbal consent to proceed.  History of Present Illness Laura [name] is a 78 year old female who presents for evaluation of pain management options for chronic back and knee pain.  She has been experiencing worsening back pain for the past year, which has become increasingly debilitating. A sacroiliac joint injection administered on September 26 provided relief for three to four weeks, but the pain returned about a week ago. The pain is severe and impacts her daily activities.  Her knee pain is described as constant and severe, causing her to 'hump over' and sometimes stumble and fall, as she did into a rose bush. A knee injection given on August 1 provided relief for about two weeks before the pain returned. The pain radiates down both legs and causes stiffness, significantly affecting her ability to perform daily activities and go outside.  She reports symptoms in her feet, describing them as feeling like 'walking on paper' or 'cotton balls,' and sometimes like 'rocks.' She has been evaluated by a foot doctor who attributed these symptoms to nerve issues. No diabetes. She has a history of lung cancer, which was treated with surgery without chemotherapy or radiation. She also experienced a blood clot in her lung post-surgery, for which she was treated with Eliquis .  She has tried various pain medications, including oxycodone , which provided significant relief. She wants pain relief to regain some quality of life. We discussed with the patient and her husband that she is on long-term diazepam  and that prescribing OxyContin  or oxycodone  on a long-term basis would pose significant interaction risk. Pain Inventory Average Pain 10 Pain Right Now 10 My pain is constant, sharp, dull, and aching  In  the last 24 hours, has pain interfered with the following? General activity 10 Relation with others 10 Enjoyment of life 9 What TIME of day is your pain at its worst? daytime, evening, and night Sleep (in general) Poor  Pain is worse with: walking, bending, sitting, and standing Pain improves with: medication, TENS, and injections Relief from Meds: 9  Family History  Problem Relation Age of Onset   Pneumonia Mother    Hypertension Mother    Heart attack Father    Fibromyalgia Brother    Pulmonary embolism Brother    Hypertension Brother    Heart disease Brother    Heart attack Paternal Grandfather    Breast cancer Neg Hx    Colon cancer Neg Hx    Esophageal cancer Neg Hx    Rectal cancer Neg Hx    Stomach cancer Neg Hx    Social History   Socioeconomic History   Marital status: Married    Spouse name: Richard   Number of children: 2   Years of education: 12+   Highest education level: Some college, no degree  Occupational History   Occupation: Retired   Occupation: retired  Tobacco Use   Smoking status: Never   Smokeless tobacco: Never  Vaping Use   Vaping status: Never Used  Substance and Sexual Activity   Alcohol use: No    Alcohol/week: 0.0 standard drinks of alcohol   Drug use: No   Sexual activity: Not Currently    Birth control/protection: Post-menopausal  Other Topics Concern   Not on file  Social History  Narrative   Lives at home with husband.   Right-handed.   Drinks 2-3 cups caffeine per day.   No regular exercise.   Social Drivers of Corporate Investment Banker Strain: Low Risk  (09/28/2022)   Received from [facility]   Overall Financial Resource Strain (CARDIA)    Difficulty of Paying Living Expenses: Not very hard  Food Insecurity: No Food Insecurity (09/28/2022)   Received from [facility]   Hunger Vital Sign    Within the past 12 months, you worried that your food would run out before you got the money to buy more.: Never true     Within the past 12 months, the food you bought just didn't last and you didn't have money to get more.: Never true  Transportation Needs: No Transportation Needs (09/28/2022)   Received from [facility] - Transportation    Lack of Transportation (Medical): No    Lack of Transportation (Non-Medical): No  Physical Activity: Not on file  Stress: No Stress Concern Present (02/05/2022)   Received from [facility] - Occupational Stress Questionnaire    Feeling of Stress : Not at all  Social Connections: Unknown (10/27/2021)   Received from [facility]   Social Network    Social Network: Not on file   Past Surgical History:  Procedure Laterality Date   CARDIAC CATHETERIZATION  09-04-1999;  08/25/2004;   12/09/2009  DR FRANCYNE   NORMAL CORONARIES/  APICAL BALLOONING OF LV CONSISTENT WITH TAKOTSUBO SYMPTOMS/ EF 30-35%   CATARACT EXTRACTION W/ INTRAOCULAR LENS  IMPLANT, BILATERAL     CHOLECYSTECTOMY N/A 09/29/2012   Procedure: LAPAROSCOPIC CHOLECYSTECTOMY WITH INTRAOPERATIVE CHOLANGIOGRAM;  Surgeon: Elspeth KYM Schultze, MD;  Location: [facility] OR;  Service: General;  Laterality: N/A;   COLONOSCOPY  07/2020   CYSTOSCOPY N/A 09/19/2012   Procedure: PHYLLIS SIDE;  Surgeon: Alm GORMAN Fragmin, MD;  Location: [facility];  Service: Urology;  Laterality: N/A;   DILATION AND CURETTAGE OF UTERUS     EYE SURGERY Bilateral    cataract removal   INTERCOSTAL NERVE BLOCK  05/30/2021   Procedure: INTERCOSTAL NERVE BLOCK;  Surgeon: Kerrin Elspeth BROCKS, MD;  Location: [facility] OR;  Service: Thoracic;;   KNEE ARTHROSCOPY W/ MENISCECTOMY Right 07/27/2011   MEDIAL AND LATERAL   LOBECTOMY  05/30/2021   Procedure: LEFT LOWER LOBECTOMY;  Surgeon: Kerrin Elspeth BROCKS, MD;  Location: [facility] OR;  Service: Thoracic;;   LYMPH NODE DISSECTION  05/30/2021   Procedure: LYMPH NODE DISSECTION;  Surgeon: Kerrin Elspeth BROCKS, MD;  Location: [facility] OR;  Service: Thoracic;;    NASAL SEPTUM SURGERY  02/28/1979   PUBOVAGINAL SLING N/A 09/19/2012   Procedure: SUBURETHRAL OZIE GLADE;  Surgeon: Alm GORMAN Fragmin, MD;  Location: [facility];  Service: Urology;  Laterality: N/A;   RIGHT URETEROSCOPIC STONE EXTRACTION  08/31/2000   TRANSTHORACIC ECHOCARDIOGRAM  05-27-2011  DR CROITORU   MODERATELY DEPRESSED LVF DUE TO GLOBAL HYPOKINESIS AND MARKED SYSTOLIC ASYNCHRONY/ EF 38%/ MILD LEFT ATRIAL DILATATION   VAGINAL HYSTERECTOMY  06/30/1983   partial   Past Surgical History:  Procedure Laterality Date   CARDIAC CATHETERIZATION  09-04-1999;  08/25/2004;   12/09/2009  DR CROITORU   NORMAL CORONARIES/  APICAL BALLOONING OF LV CONSISTENT WITH TAKOTSUBO SYMPTOMS/ EF 30-35%   CATARACT EXTRACTION W/ INTRAOCULAR LENS  IMPLANT, BILATERAL     CHOLECYSTECTOMY N/A 09/29/2012   Procedure: LAPAROSCOPIC CHOLECYSTECTOMY WITH INTRAOPERATIVE CHOLANGIOGRAM;  Surgeon: Elspeth BROCKS.  Gross, MD;  Location: [facility] OR;  Service: General;  Laterality: N/A;   COLONOSCOPY  07/2020   CYSTOSCOPY N/A 09/19/2012   Procedure: PHYLLIS SIDE;  Surgeon: Alm GORMAN Fragmin, MD;  Location: [facility];  Service: Urology;  Laterality: N/A;   DILATION AND CURETTAGE OF UTERUS     EYE SURGERY Bilateral    cataract removal   INTERCOSTAL NERVE BLOCK  05/30/2021   Procedure: INTERCOSTAL NERVE BLOCK;  Surgeon: Kerrin Elspeth BROCKS, MD;  Location: [facility] OR;  Service: Thoracic;;   KNEE ARTHROSCOPY W/ MENISCECTOMY Right 07/27/2011   MEDIAL AND LATERAL   LOBECTOMY  05/30/2021   Procedure: LEFT LOWER LOBECTOMY;  Surgeon: Kerrin Elspeth BROCKS, MD;  Location: [facility] OR;  Service: Thoracic;;   LYMPH NODE DISSECTION  05/30/2021   Procedure: LYMPH NODE DISSECTION;  Surgeon: Kerrin Elspeth BROCKS, MD;  Location: [facility] OR;  Service: Thoracic;;   NASAL SEPTUM SURGERY  02/28/1979   PUBOVAGINAL SLING N/A 09/19/2012   Procedure: SUBURETHRAL OZIE GLADE;  Surgeon: Alm GORMAN Fragmin, MD;  Location: [facility];  Service: Urology;  Laterality: N/A;   RIGHT URETEROSCOPIC STONE EXTRACTION  08/31/2000   TRANSTHORACIC ECHOCARDIOGRAM  05-27-2011  DR CROITORU   MODERATELY DEPRESSED LVF DUE TO GLOBAL HYPOKINESIS AND MARKED SYSTOLIC ASYNCHRONY/ EF 38%/ MILD LEFT ATRIAL DILATATION   VAGINAL HYSTERECTOMY  06/30/1983   partial   Past Medical History:  Diagnosis Date   Anemia    Anxiety    Asthma    related to sesonal allergies   Cancer (HCC)    Lung   Cataract    Chronic combined systolic and diastolic CHF, NYHA class 2 (HCC) CARDIOLOGIST-  DR [id number]   Coronary artery disease    Depression    History of colon polyps 07/2019   tubular adenoma - Dr. Luis   History of kidney stones    History of non-ST elevation myocardial infarction (NSTEMI) 11/27/2009   SECONDARY TO TAKOTSUDO SYNDROME (CARDIAC CATH NORMAL)   Hyperlipemia    Hypertension    Hypoglycemia    Hypothyroidism    LBBB (left bundle branch block)    Left ventricular ejection fraction less than 40%    38% PER CARDIOLOGIST NOTE (DR CROITORU)   Memory loss    Mood swings    Myocardial infarction (HCC) 2011   Nonischemic dilated cardiomyopathy (HCC)    MODERATELY DEPRESSED LVF;EF 35-45% by Echo 05/27/11   OSA (obstructive sleep apnea) MODERATE PER STUDY 2005   CPAP NONCOMPLIANT   Pre-diabetes    Pulmonary embolism (HCC)    after the lobectomy   Seasonal allergies    Sleep apnea    SUI (stress urinary incontinence, female)    There were no vitals taken for this visit.  Opioid Risk Score:   Fall Risk Score:  `1  Depression screen [facility] 2/9     01/28/2024    9:10 AM  Depression screen PHQ 2/9  Decreased Interest 3  Down, Depressed, Hopeless 2  PHQ - 2 Score 5  Altered sleeping 3  Tired, decreased energy 3  Change in appetite 2  Feeling bad or failure about yourself  1  Trouble concentrating 3  Moving slowly or fidgety/restless 0  Suicidal thoughts 0  PHQ-9 Score 17  Difficult doing work/chores Extremely  dIfficult     Review of Systems  Musculoskeletal:  Positive for back pain and gait problem.       Injection lasted about 4 weeks  All other systems reviewed and are  negative.      Objective:   Physical Exam  General no acute distress Mood and affect appropriate Ambulates without assistive device no evidence toe drag and instability Lower extremity strength is 5/5 bilateral hip flexor knee extensor ankle dorsiflexor Negative straight leg raising bilaterally Sensation normal bilateral lower extremities She has full range of motion right knee no evidence of knee effusion.  Mild joint line tenderness Patient complains of pain going from sit to stand in both knees right greater than left side. Lumbar spine range of motion mildly reduced Sacroiliac provocative tests +3/5      Assessment & Plan:  Assessment and Plan Assessment & Plan Sacroiliac joint pain Sacroiliac joint pain with temporary relief from previous injection, indicating correct anatomical location of pain. - Refer to Dr. Beuford at [facility] for surgical consultation regarding sacroiliac joint stabilization.  The patient did have excellent short-term relief i.e. 3 to 4 weeks following bilateral sacroiliac corticosteroid injections under fluoroscopic guidance. - Discuss private pay option for nerve burning procedure if surgery is not pursued.  Chronic low back pain Chronic low back pain with temporary relief from previous interventions. - Consider sacroiliac nerve blocks if cortisone injections are not feasible.  Bilateral knee osteoarthritis with chronic pain Chronic bilateral knee pain with previous short-term relief from cortisone injection. Bone-on-bone condition noted. - Schedule long-acting cortisone injection for knee pain. - If cortisone is ineffective, consider gel injections or nerve blocks as subsequent options.  Chronic bilateral leg pain Chronic bilateral leg pain with associated stiffness  and difficulty walking. No significant nerve impingement noted on MRI. Symptoms may be related to knee and back issues. - Consider further evaluation if pain persists.    "

## 2024-04-26 NOTE — Patient Instructions (Addendum)
 _______________________________________________________  If your blood pressure at your visit was 140/90 or greater, please contact your primary care physician to follow up on this.  _______________________________________________________  If you are age 78 or older, your body mass index should be between 23-30. Your Body mass index is 30.11 kg/m. If this is out of the aforementioned range listed, please consider follow up with your Primary Care Provider.  If you are age 78 or younger, your body mass index should be between 19-25. Your Body mass index is 30.11 kg/m. If this is out of the aformentioned range listed, please consider follow up with your Primary Care Provider.   ________________________________________________________  The [facility] providers would like to encourage you to use MYCHART to communicate with providers for non-urgent requests or questions.  Due to long hold times on the telephone, sending your provider a message by [facility] may be a faster and more efficient way to get a response.  Please allow 48 business hours for a response.  Please remember that this is for non-urgent requests.  _______________________________________________________  [facility] is using a team-based approach to care.  Your team is made up of your doctor and two to three APPS. Our APPS (Nurse Practitioners and Physician Assistants) work with your physician to ensure care continuity for you. They are fully qualified to address your health concerns and develop a treatment plan. They communicate directly with your gastroenterologist to care for you. Seeing the Advanced Practice Practitioners on your physician's team can help you by facilitating care more promptly, often allowing for earlier appointments, access to diagnostic testing, procedures, and other specialty referrals.   Follow the instructions on the Hemoccult cards and mail them back to us  when you are finished or you may take them  directly to the lab in the basement of the [facility] building. We will call you with the results.   Please follow up in 3 months with Dr Federico. Give us  a call at [phone] to schedule an appointment.  VISIT SUMMARY:  Today, we discussed your ongoing bowel issues, including chronic constipation and small intestinal bacterial overgrowth (SIBO). We also addressed your gastroesophageal reflux disease (GERD) and gastritis.  YOUR PLAN:  CHRONIC CONSTIPATION WITH SMALL INTESTINAL BACTERIAL OVERGROWTH (SIBO): You have chronic constipation and SIBO, which contribute to your bloating and altered bowel habits. -Take doxycycline  for 10 days to treat SIBO. -Use Miralax  daily, even if it's just half a capful, to help with regularity. -Miralax  is not habit-forming and is better than stimulant laxatives. -If Miralax  is not effective, we may consider retrying Linzess at a higher dose, depending on your insurance coverage.  GASTROESOPHAGEAL REFLUX DISEASE (GERD) AND GASTRITIS: You have GERD and gastritis, which have caused inflammation in your stomach and esophagus. -Start taking pantoprazole  to manage your GERD and gastritis symptoms. -We will ensure the medication is sent to a pharmacy that accepts your insurance or payment method.  Miralax  is an osmotic laxative.  It only brings more water  into the stool.  This is safe to take daily.  Can take up to 17 gram of miralax  twice a day.  Mix with juice or coffee.  Start 1 capful at night for 3-4 days and reassess your response in 3-4 days.  You can increase and decrease the dose based on your response.  Remember, it can take up to 3-4 days to take effect OR for the effects to wear off.   I often pair this with benefiber in the morning to help assure the stool  is not too loose.   Please do the following for a bowel purge: Purchase a bottle of Miralax  over the counter as well as a box of 5 mg dulcolax tablets. Take 4 dulcolax tablets. Wait 1 hour. You  will then drink 6-8 capfuls of Miralax  mixed in an adequate amount of water /juice/gatorade (you may choose which of these liquids to drink) over the next 2-3 hours. You should expect results within 1 to 6 hours after completing the bowel purge. Go to the er if you have severe AB pain, can not pass gas or stool in over 12 hours, can not hold down any food.   Small intestinal bacterial overgrowth (SIBO) occurs when there is an abnormal increase in the overall bacterial population in the small intestine -- particularly types of bacteria not commonly found in that part of the digestive tract. Small intestinal bacterial overgrowth (SIBO) commonly results when a circumstance -- such as surgery or disease -- slows the passage of food and waste products in the digestive tract, creating a breeding ground for bacteria.  Signs and symptoms of SIBO often include: Loss of appetite Abdominal pain Nausea Bloating An uncomfortable feeling of fullness after eating Diarrhea or constipation, depending on the type of gas produced  What foods trigger SIBO? While foods aren't the original cause of SIBO, certain foods do encourage the overgrowth of the wrong bacteria in your small intestine. If you're feeding them their favorite foods, they're going to grow more, and that will trigger more of your SIBO symptoms. By the same token, you can help reduce the overgrowth by starving the problematic bacteria of their favorite foods. This strategy has led to a number of proposed SIBO eating plans. The plans vary, and so do individual results. But in general, they tend to recommend limiting carbohydrates.  These include: Sugars and sweeteners. Fruits and starchy vegetables. Dairy products. Grains.  There is a test for this we can do called a breath test, if you are positive we will treat you with an antibiotic to see if it helps.  Your symptoms are very suspicious for this condition, as discussed, we will start you on an  antibiotic to see if this helps.

## 2024-04-26 NOTE — Progress Notes (Signed)
 04/26/2024 Laura [name] [id number] [birth year]  Referring provider: Arloa [name] SAUNDERS, MD Primary GI doctor: Dr. Federico  ASSESSMENT AND PLAN:  Constipation with associated bloating, fecal incontinence/pelvic floor dysfunction Status post cholecystectomy History of pelvic floor physical therapy, pessary, potential neurogenic bowel 05/22/2023 SIBO positive 05/13/2023 CTAP W diverticulosis without diverticulitis aortic atherosclerosis unremarkable liver pancreas spleen stomach and bowel. 06/03/2023 colonoscopy normal TI, 4 polyps 3 to 8 mm transverse colon, diverticulosis sigmoid descending colon, IH tubular adenomatous polyps negative dysplasia recall 05/2026 01/24/2024 KUB no obstruction moderate stool right colon Has BM every 3 days, on miralax  as needed Never did bowel purge, never did doxycycline  - Prescribe doxycycline  for 10 days for SIBO. - Advise daily Miralax  use, even half capful, for regularity. - Discuss Miralax 's non-habit forming nature and benefits over stimulant laxatives. - Consider retrying Linzess at higher dose if Miralax  ineffective, pending insurance versus amitiza - given bowel purge  GERD/gastric intestinal metaplasia Status post cholecystectomy 2021 AB US  normal 08/2023 EGD small HH gastritis 14 mm sessile polyp no bleeding resected negative dysplasia, H. pylori moderate, showed chronic focal minimally active gastritis and focal intestinal metaplasia possible atrophy, no surveillance 12/2023 Ct chest showed Gas fluid levels and a patulous esophagus with a tiny hiatal hernia, correlate for gastroesophageal reflux. -Add on pantoprazole  40 mg daily -Consider barium swallow  Anemia 02/15/2024  HGB 11.7 MCV 76.3 Platelets 231 02/15/2024 Iron 125 Ferritin 18 B12 172 Recent Labs    01/12/24 1127 02/15/24 1337  HGB 11.7* 11.7*  08/2023 EGD small HH gastritis 14 mm sessile polyp no bleeding resected negative dysplasia, H. 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  HFpEF 06/2023 Echo Ejection fraction 45-50%, mild sclerosis without stenosis  CAD 02/2022 lexiscan  low risk study  COPD 12/2023 CT chest with contrast no metastatic disease  Personal history of colon polyps Recall colonoscopy 05/2026  Patient Care Team: Laura [name] SAUNDERS, MD as PCP - General (Family Medicine) Croitoru, Jerel, MD as PCP - Cardiology (Cardiology) Laura Elsie, MD as Consulting Physician (Gastroenterology) Laura Lung, MD as Consulting Physician (Psychiatry) Croitoru, Jerel, MD as Consulting Physician (Cardiology) Laura Bouchard, DO as Consulting Physician (Obstetrics and Gynecology) Laura Golas, MD as Consulting Physician (Ophthalmology)  HISTORY OF PRESENT ILLNESS: 78 y.o. female with a past medical history of anxiety, [name] adenocarcinoma s/p LLL lobectomy in 05/2021, HFpEF (EF 45-50%), COPD, CAD, hypothyroidism, OSA, PE, and HTN and others listed below presents for evaluation of GERd/constipation.   Patient last seen in the office by Dr. Federico 01/24/2024 for constipation and bloating.  Discussed the use of AI scribe software for clinical note transcription with the patient, who gave verbal consent to proceed.  History of Present Illness   Laura [name] is a 78 year old female with chronic constipation and small intestinal bacterial overgrowth who presents for evaluation of ongoing bowel issues.  She has a long-standing history of constipation, resistant to various treatments. She does not have bowel movements  daily or every other day but is satisfied as long as she has one. She uses Miralax  intermittently, taking it in her coffee, which helps her have a bowel movement after  a few days of use, although it causes increased gas. She previously tried Linzess, which initially provided relief but eventually lost its effectiveness.  In 2024, she was diagnosed with small intestinal bacterial overgrowth (SIBO). She experiences significant gas, especially in the mornings, and feels bloated primarily in her abdominal area. No nausea or loss of appetite recently, but she does have occasional discomfort as if something is stuck in her throat.  Her past medical history includes a lobectomy for [name] cancer and chronic back pain, which she describes as constant and severe, affecting her ability to walk and perform daily activities. She has received various treatments, including injections in her hips and spine, which provided temporary relief. She is currently under the care of a neurologist for her back issues.  She also has a history of pelvic floor dysfunction and uses a pessary for pelvic support. Her back pain limits her ability to perform pelvic floor exercises. Her medication history includes the use of Miralax  for constipation and a past trial of Linzess. She is not currently on pantoprazole .     She  reports that she has never smoked. She has never used smokeless tobacco. She reports that she does not drink alcohol and does not use drugs.  RELEVANT GI HISTORY, IMAGING AND LABS: Results   RADIOLOGY Abdominal X-ray: Constipation with significant fecal loading in the colon (12/2023)  DIAGNOSTIC Colonoscopy: Polyps, diverticulosis, hemorrhoids (04/2023) Endoscopy: Gastritis with inflammation (12/2023)     SIBO breath test (collected 05/22/23), which was positive for SIBO. Increase in hydrogen was 26 ppm (nml<20) and increase in methane was 3 ppm (nml<12).    Complete U/S 07/21/19: Normal abdominal ultrasound.  Cholecystectomy.   KUB 12/11/21: IMPRESSION: Bowel-gas pattern is unremarkable. No radiographic evidence of constipation. Moderate thoracolumbar spondylosis. No acute cardiopulmonary disease.   CT A/P w/contrast 03/06/22: IMPRESSION:  1. No acute findings    CT A/P w/contrast 05/13/23: IMPRESSION: Left colonic diverticulosis.  No active diverticulitis. Aortic atherosclerosis. No acute findings in the abdomen or pelvis.   CT chest w/contrast 01/12/24: IMPRESSION: 1. Prior left lower lobectomy without evidence of local recurrence. 2. No evidence of metastatic disease in the chest. 3. Gas fluid levels and a patulous esophagus with a tiny hiatal hernia, correlate for gastroesophageal reflux. Aortic Atherosclerosis (ICD10-I70.0) and Emphysema (ICD10-J43.9).    EGD 08/03/2019: Normal esophagus.  3 cm hiatal hernia.  2 adjacent 8 mm semi-pedunculated gastric polyps that were resected with hot snare.  Stomach biopsies were obtained.  Normal duodenum.  Path: B.  STOMACH POLYP, ANTRUM, POLYPECTOMY:       Polypoid fragments of antral type gastric mucosa with  reactive gastropathy and chronic active gastritis with focal  intestinal metaplasia.       No H. pylori identified on H&E stained slide or  immunohistochemistry.      No dysplasia or malignancy identified.       See Comment.  COMMENT:  An immunohistochemical stain for Helicobacter pylori was  performed on block B1, and is negative for organisms.  The positive immunohistochemical control worked appropriately.    Colonoscopy 08/03/2019: 7 mm semipedunculated polyp in the mid ascending colon that was resected with cold snare.  Diverticulosis in the left colon.  Small internal hemorrhoids.  Repeat colonoscopy recommended in 5 years. Path: A.  COLON POLYP, ASCENDING, POLYPECTOMY:       Tubular adenoma.    Colonoscopy 06/03/23: - The examined portion of the ileum was normal. - Four 3 to 8 mm polyps in the transverse  colon and in the  ascending colon, removed with a cold snare. Resected and retrieved. - Diverticulosis in the sigmoid colon and in the descending colon. - Non- bleeding internal hemorrhoids. Path:      1. Surgical [P], colon, transverse and ascending, polyp (4) :       - TUBULAR ADENOMA(S)       - NEGATIVE FOR HIGH-GRADE DYSPLASIA OR MALIGNANCY    EGD 09/30/23: - The examined esophagus was normal. - A small hiatal hernia was present. - Localized inflammation characterized by congestion ( edema) , erosions and erythema was found in the gastric antrum. Biopsies were taken with a cold forceps for histology. - A single 14 mm sessile polyp with no bleeding and no stigmata of recent bleeding was found in the gastric antrum. The polyp was removed with a cold snare. Resection and retrieval were complete. - Biopsies were taken with a cold forceps on the greater curvature of the gastric body, on the lesser curvature of the gastric body, at the incisura, on the greater curvature of the gastric antrum and on the lesser curvature of the gastric antrum for histology. Path: 1. Surgical [P], gastric polyp, polyp (1) :      -  ANTRAL-TYPE MUCOSA WITH SIGNIFICANT CHEMICAL/REACTIVE GASTROPATHY WITH      REACTIVE/REPARATIVE CHANGE, ASSOCIATED POLYPOID FOVEOLAR HYPERPLASIA AND FOCAL      EVIDENCE OF EROSION.      -  NEGATIVE FOR DYSPLASIA.      -  AN IMMUNOHISTOCHEMICAL STAIN FOR HELICOBACTER PYLORI ORGANISMS IS NEGATIVE.      2. Surgical [P], gastric incisura antrum :      -  ANTRAL-TYPE MUCOSA WITH FEATURES OF BOTH CHEMICAL/REACTIVE CHANGE AND CHRONIC      INACTIVE GASTRITIS.      -  AN IMMUNOHISTOCHEMICAL STAIN FOR HELICOBACTER PYLORI ORGANISMS IS NEGATIVE.      3. Surgical [P], gastric body :      -  PREDOMINANTLY ANTRAL-TYPE (NOTED TO BE FROM BODY) MUCOSA WITH MODERATE      CHRONIC FOCAL MINIMALLY ACTIVE GASTRITIS AND FOCAL INTESTINAL METAPLASIA      (FINDINGS SUGGESTIVE OF POSSIBLE ATROPHY.      -  AN IMMUNOHISTOCHEMICAL STAIN  FOR HELICOBACTER PYLORI ORGANISMS IS NEGATIVE.  CBC    Component Value Date/Time   WBC 7.0 02/15/2024 1337   WBC 11.8 (H) 12/11/2021 1629   RBC 4.82 02/15/2024 1337   HGB 11.7 (L) 02/15/2024 1337   HCT 36.8 02/15/2024 1337   PLT 231 02/15/2024 1337   MCV 76.3 (L) 02/15/2024 1337   MCH 24.3 (L) 02/15/2024 1337   MCHC 31.8 02/15/2024 1337   RDW 15.2 02/15/2024 1337   LYMPHSABS 1.6 02/15/2024 1337   MONOABS 0.7 02/15/2024 1337   EOSABS 0.2 02/15/2024 1337   BASOSABS 0.1 02/15/2024 1337   Recent Labs    01/12/24 1127 02/15/24 1337  HGB 11.7* 11.7*    CMP     Component Value Date/Time   NA 140 01/12/2024 1127   NA 144 08/30/2020 1550   K 4.2 01/12/2024 1127   CL 106 01/12/2024 1127   CO2 29 01/12/2024 1127   GLUCOSE 93 01/12/2024 1127   BUN 15 01/12/2024 1127   BUN 10 08/30/2020 1550   CREATININE 0.73 01/12/2024 1127   CREATININE 0.75 05/15/2015 1306   CALCIUM  9.5 01/12/2024 1127   PROT 6.9 01/12/2024 1127   PROT 6.8 08/30/2020 1550   ALBUMIN 3.9 01/12/2024 1127   ALBUMIN 4.3 08/30/2020 1550  AST 15 01/12/2024 1127   ALT 10 01/12/2024 1127   ALKPHOS 72 01/12/2024 1127   BILITOT 0.3 01/12/2024 1127   GFRNONAA >60 01/12/2024 1127   GFRNONAA 52 (L) 08/23/2014 1623   GFRAA 81 12/19/2018 1358   GFRAA 60 08/23/2014 1623      Latest Ref Rng & Units 01/12/2024   11:27 AM 01/19/2023   10:24 AM 07/22/2022    9:10 AM  Hepatic Function  Total Protein 6.5 - 8.1 g/dL 6.9  6.6  6.9   Albumin 3.5 - 5.0 g/dL 3.9  4.0  3.8   AST 15 - 41 U/L 15  22  15    ALT 0 - 44 U/L 10  19  14    Alk Phosphatase 38 - 126 U/L 72  65  64   Total Bilirubin 0.0 - 1.2 mg/dL 0.3  0.4  0.6       Current Medications:   Current Outpatient Medications (Endocrine & Metabolic):    levothyroxine  (SYNTHROID , LEVOTHROID) 88 MCG tablet, Take 88 mcg by mouth daily before breakfast.  Current Facility-Administered Medications (Endocrine & Metabolic):    betamethasone  acetate-betamethasone  sodium  phosphate (CELESTONE ) injection 6 mg  Current Outpatient Medications (Cardiovascular):    amLODipine  (NORVASC ) 5 MG tablet, Take 1 tablet (5 mg total) by mouth daily.   atorvastatin  (LIPITOR ) 40 MG tablet, Take 1 tablet (40 mg total) by mouth daily.   irbesartan  (AVAPRO ) 300 MG tablet, Take 300 mg by mouth daily.   metoprolol  succinate (TOPROL -XL) 50 MG 24 hr tablet, TAKE ONE AND ONE-HALF TABLETS BY MOUTH DAILY (Patient taking differently: 75 mg daily.)      Current Facility-Administered Medications (Analgesics):    lidocaine  (XYLOCAINE ) 1 % (with pres) injection 5 mL   lidocaine  HCl (PF) (XYLOCAINE ) 2 % injection 2 mL    Current Outpatient Medications (Other):    diazepam  (VALIUM ) 10 MG tablet, Take 10 mg by mouth every 6 (six) hours as needed for anxiety.   doxycycline  (VIBRA -TABS) 100 MG tablet, Take 1 tablet (100 mg total) by mouth 2 (two) times daily.   DULoxetine  (CYMBALTA ) 60 MG capsule, Take 1 capsule by mouth daily.   Melatonin 10 MG TABS, Take by mouth at bedtime as needed. Pt taking 21/2 mg a day   NUVIGIL  250 MG tablet, Take 250 mg by mouth daily as needed (energy boost).   pantoprazole  (PROTONIX ) 40 MG tablet, Take 1 tablet (40 mg total) by mouth daily.   polyethylene glycol (MIRALAX ) 17 g packet, Take 17 g by mouth 3 (three) times daily.  Current Facility-Administered Medications (Other):    0.9 %  sodium chloride  infusion   iohexol  (OMNIPAQUE ) 180 MG/ML injection 2 mL  Medical History:  Past Medical History:  Diagnosis Date   Anemia    Anxiety    Asthma    related to sesonal allergies   Cancer (HCC)    [name]   Cataract    Chronic combined systolic and diastolic CHF, NYHA class 2 (HCC) CARDIOLOGIST-  DR [id number]   Coronary artery disease    Depression    History of colon polyps 07/2019   tubular adenoma - Dr. Luis   History of kidney stones    History of non-ST elevation myocardial infarction (NSTEMI) 11/27/2009   SECONDARY TO TAKOTSUDO SYNDROME (CARDIAC  CATH NORMAL)   Hyperlipemia    Hypertension    Hypoglycemia    Hypothyroidism    LBBB (left bundle branch block)    Left ventricular ejection fraction less than 40%  38% PER CARDIOLOGIST NOTE (DR CROITORU)   Memory loss    Mood swings    Myocardial infarction [employer]) 2011   Nonischemic dilated cardiomyopathy (HCC)    MODERATELY DEPRESSED LVF;EF 35-45% by Echo 05/27/11   OSA (obstructive sleep apnea) MODERATE PER STUDY 2005   CPAP NONCOMPLIANT   Pre-diabetes    Pulmonary embolism (HCC)    after the lobectomy   Seasonal allergies    Sleep apnea    SUI (stress urinary incontinence, female)    Allergies:  Allergies  Allergen Reactions   Codeine Anaphylaxis, Hives and Other (See Comments)    Headache. Daughter reported that it caused her throat to swell up    Ciprofloxacin Nausea And Vomiting   Cyanocobalamin  Swelling    Lower extremity swelling   Vortioxetine Nausea Only    Other Reaction(s): dizziness   Ace Inhibitors Other (See Comments)    Unknown- it didn't agree with her    Citalopram  Other (See Comments)    Fatigue   Fetzima [Levomilnacipran] Other (See Comments)    Talking out of my head   Lasix [Furosemide] Other (See Comments)    HEADACHE   Linzess [Linaclotide] Nausea Only   Nsaids Other (See Comments)    Told not to take NSAIDs because of her heart    Sulfamethoxazole-Trimethoprim Other (See Comments)    NERVOUS AND DISORENTED   Xanax Xr [Alprazolam Er] Other (See Comments)    confusion     Surgical History:  She  has a past surgical history that includes Nasal septum surgery (02/28/1979); Vaginal hysterectomy (06/30/1983); Cataract extraction w/ intraocular lens  implant, bilateral; RIGHT URETEROSCOPIC STONE EXTRACTION (08/31/2000); Knee arthroscopy w/ meniscectomy (Right, 07/27/2011); transthoracic echocardiogram (05-27-2011  DR CROITORU); Cardiac catheterization (09-04-1999;  08/25/2004;   12/09/2009  DR CROITORU); Pubovaginal sling (N/A, 09/19/2012);  Cystoscopy (N/A, 09/19/2012); Cholecystectomy (N/A, 09/29/2012); Colonoscopy (07/2020); Eye surgery (Bilateral); Dilation and curettage of uterus; Lobectomy (05/30/2021); Intercostal nerve block (05/30/2021); and Lymph node dissection (05/30/2021). Family History:  Her family history includes Fibromyalgia in her brother; Heart attack in her father and paternal grandfather; Heart disease in her brother; Hypertension in her brother and mother; Pneumonia in her mother; Pulmonary embolism in her brother.  REVIEW OF SYSTEMS  : All other systems reviewed and negative except where noted in the History of Present Illness.  PHYSICAL EXAM: BP 136/80 (BP Location: Left Arm, Patient Position: Sitting, Cuff Size: Normal)   Pulse 71   Ht 5' 3 (1.6 m)   Wt 170 lb (77.1 kg)   BMI 30.11 kg/m  Physical Exam   MEASUREMENTS: Weight- 170. GENERAL APPEARANCE: Well nourished, in no apparent distress HEENT: No cervical lymphadenopathy, unremarkable thyroid , sclerae anicteric, conjunctiva pink RESPIRATORY: Respiratory effort normal, BS equal bilateral without rales, rhonchi, wheezing CARDIO: RRR with no MRGs, peripheral pulses intact ABDOMEN: Soft, non distended, active bowel sounds in all 4 quadrants, no tenderness to palpation, no rebound, no mass appreciated RECTAL: declines MUSCULOSKELETAL: Full ROM, normal gait, without edema SKIN: Dry, intact without rashes or lesions. No jaundice. NEURO: Alert, oriented, no focal deficits PSYCH: Cooperative, normal mood and affect.      Alan JONELLE Coombs, PA-C 3:02 PM

## 2024-04-27 ENCOUNTER — Encounter: Attending: Physical Medicine & Rehabilitation | Admitting: Physical Medicine & Rehabilitation

## 2024-04-27 ENCOUNTER — Ambulatory Visit: Admitting: Obstetrics and Gynecology

## 2024-04-27 ENCOUNTER — Encounter: Payer: Self-pay | Admitting: Physical Medicine & Rehabilitation

## 2024-04-27 VITALS — BP 135/74 | HR 77 | Ht 63.0 in | Wt 171.0 lb

## 2024-04-27 DIAGNOSIS — M533 Sacrococcygeal disorders, not elsewhere classified: Secondary | ICD-10-CM | POA: Insufficient documentation

## 2024-04-27 MED ORDER — OXYCODONE HCL 5 MG PO TABS: 5.0000 mg | ORAL_TABLET | Freq: Four times a day (QID) | ORAL | 0 refills | Status: AC | PRN

## 2024-04-27 NOTE — Patient Instructions (Addendum)
  VISIT SUMMARY: Today, we discussed your chronic back and knee pain and explored various pain management options to improve your quality of life. We also addressed your leg pain and foot symptoms.  YOUR PLAN: SACROILIAC JOINT PAIN: You have pain in your sacroiliac joint that was temporarily relieved by a previous injection. -We will refer you to Dr. Beuford at [facility] for a surgical consultation regarding sacroiliac joint stabilization. -If surgery is not an option, we can discuss a private pay option for a nerve burning procedure.  CHRONIC LOW BACK PAIN: You have chronic low back pain that was temporarily relieved by previous treatments. -We will consider sacroiliac nerve blocks if cortisone injections are not feasible.  BILATERAL KNEE OSTEOARTHRITIS WITH CHRONIC PAIN: You have chronic knee pain due to osteoarthritis, with previous short-term relief from cortisone injections. -We will schedule a long-acting cortisone injection for your knee pain. -If the cortisone injection is not effective, we can consider gel injections or nerve blocks as the next steps.  CHRONIC BILATERAL LEG PAIN: You have chronic leg pain with stiffness and difficulty walking, likely related to your knee and back issues. -We will consider further evaluation if your pain persists.  Because of your daily Diazepam  use, long term use of oxycodone  is not indicated                      Contains text generated by [employer].                                 Contains text generated by [employer].

## 2024-05-03 ENCOUNTER — Ambulatory Visit: Attending: Cardiology | Admitting: Pharmacist Clinician (PhC)/ Clinical Pharmacy Specialist

## 2024-05-03 VITALS — BP 134/78 | HR 69

## 2024-05-03 DIAGNOSIS — I428 Other cardiomyopathies: Secondary | ICD-10-CM | POA: Insufficient documentation

## 2024-05-03 DIAGNOSIS — I10 Essential (primary) hypertension: Secondary | ICD-10-CM | POA: Diagnosis present

## 2024-05-03 DIAGNOSIS — E782 Mixed hyperlipidemia: Secondary | ICD-10-CM | POA: Insufficient documentation

## 2024-05-03 NOTE — Progress Notes (Unsigned)
 Office Visit    Patient Name: Laura [name] Date of Encounter: 05/05/2024  Primary Care Provider:  [name], MD Primary Cardiologist:  Jerel Balding, MD  Chief Complaint    Medication confustion  Significant Past Medical History   HFmrEF 6/25 EF at 40-45%  Takotsubo 11/2009 - normal coronary arteries  HLD 5/25 LDL 101, on atorvastatin  40  HTN Elevated yesterday with Dr. Balding, started amlodipine    B12 deficiency Notes LEE with B12 injections and tablets    Allergies  Allergen Reactions   Codeine Anaphylaxis, Hives and Other (See Comments)    Headache. Daughter reported that it caused her throat to swell up    Ciprofloxacin Nausea And Vomiting   Cyanocobalamin  Swelling    Lower extremity swelling   Vortioxetine Nausea Only    Other Reaction(s): dizziness   Ace Inhibitors Other (See Comments)    Unknown- it didn't agree with her    Citalopram  Other (See Comments)    Fatigue   Fetzima [Levomilnacipran] Other (See Comments)    Talking out of my head   Lasix [Furosemide] Other (See Comments)    HEADACHE   Linzess [Linaclotide] Nausea Only   Nsaids Other (See Comments)    Told not to take NSAIDs because of her heart    Sulfamethoxazole-Trimethoprim Other (See Comments)    NERVOUS AND DISORENTED   Xanax [place]] Other (See Comments)    confusion    History of Present Illness    Laura [name] is a 78 y.o. female patient of Dr Balding, in the office today to review her medications.  She was seen by Dr. Balding yesterday and asked to follow up on blood pressure issues in one month.  While at checkout she was stressed about her medications making her sick and wanting to be seen sooner than a month.  There was an open slot on the schedule and she was added in today.    She is here today with her husband.  Concerned about the number of medications she takes and wonders if they are the cause of her feeling fatigued and having low B12.  She  doesn't like taking medication in general and only takes all of her medications at the same time once daily.  She notes that her energy levels declined a few months back and her PCP found her to be deficient in B12.  She was put on injections weekly for 1 month then monthly.  However after the first two doses she developed edema in her feet/ankles, and the PCP decided to switch to tablets.  She took those and noted a similar, but less severe response.  She is currently waiting about 2 weeks, then wants to try one more time.   BP meds:  amlodipine  5 mg daily, irbesartan  300 mg daily, metoprolol  succ 75 mg daily Cholesterol meds:  atorvastatin  40 mg daily,  HF meds: metoprolol  succ 75 mg daily, irbesartan  300 mg daily,   Social Hx:      Tobacco: no  Alcohol: no  Diet:  does eat more home cooked meals;  doesn't eat big meals since having gall bladder removed a few years ago;   Home BP readings:  has [employer] home device,about 78 years old; no readings with her, hasn't checked in awhile, just started amlodipine  yesterday  Adherence Assessment  Do you ever forget to take your medication? [] Yes [x] No - takes all meds once daily  Do you ever skip doses due to side effects? []   Yes [x] No  Do you have trouble affording your medicines? [] Yes [x] No  Are you ever unable to pick up your medication due to transportation difficulties? [] Yes [x] No    Accessory Clinical Findings    Lab Results  Component Value Date   CREATININE 0.73 01/12/2024   BUN 15 01/12/2024   NA 140 01/12/2024   K 4.2 01/12/2024   CL 106 01/12/2024   CO2 29 01/12/2024   Lab Results  Component Value Date   ALT 10 01/12/2024   AST 15 01/12/2024   ALKPHOS 72 01/12/2024   BILITOT 0.3 01/12/2024   Lab Results  Component Value Date   HGBA1C 6.0 (H) 12/19/2018    Home Medications    Current Outpatient Medications  Medication Sig Dispense Refill   amLODipine  (NORVASC ) 5 MG tablet Take 1 tablet (5 mg total) by mouth daily.  90 tablet 3   atorvastatin  (LIPITOR ) 40 MG tablet Take 1 tablet (40 mg total) by mouth daily.     diazepam  (VALIUM ) 10 MG tablet Take 10 mg by mouth every 6 (six) hours as needed for anxiety.     doxycycline  (VIBRA -TABS) 100 MG tablet Take 1 tablet (100 mg total) by mouth 2 (two) times daily. 20 tablet 0   DULoxetine  (CYMBALTA ) 60 MG capsule Take 1 capsule by mouth daily.     irbesartan  (AVAPRO ) 300 MG tablet Take 300 mg by mouth daily.     levothyroxine  (SYNTHROID , LEVOTHROID) 88 MCG tablet Take 88 mcg by mouth daily before breakfast.     Melatonin 10 MG TABS Take by mouth at bedtime as needed. Pt taking 21/2 mg a day     metoprolol  succinate (TOPROL -XL) 50 MG 24 hr tablet TAKE ONE AND ONE-HALF TABLETS BY MOUTH DAILY (Patient taking differently: 75 mg daily.) 90 tablet 3   NUVIGIL  250 MG tablet Take 250 mg by mouth daily as needed (energy boost).  3   oxyCODONE  (OXY IR/ROXICODONE ) 5 MG immediate release tablet Take 1 tablet (5 mg total) by mouth every 6 (six) hours as needed for severe pain (pain score 7-10). 20 tablet 0   pantoprazole  (PROTONIX ) 40 MG tablet Take 1 tablet (40 mg total) by mouth daily. 90 tablet 3   polyethylene glycol (MIRALAX ) 17 g packet Take 17 g by mouth 3 (three) times daily.     Current Facility-Administered Medications  Medication Dose Route Frequency Provider Last Rate Last Admin   0.9 %  sodium chloride  infusion  500 mL Intravenous Once Dorsey, Ying C, MD       betamethasone  acetate-betamethasone  sodium phosphate  (CELESTONE ) injection 6 mg  6 mg Intramuscular Once        iohexol  (OMNIPAQUE ) 180 MG/ML injection 2 mL  2 mL Other Once        lidocaine  (XYLOCAINE ) 1 % (with pres) injection 5 mL  5 mL Other Once        lidocaine  HCl (PF) (XYLOCAINE ) 2 % injection 2 mL  2 mL Other Once          Assessment & Plan    Essential hypertension Assessment: BP is elevated in office BP 134/78 mmHg;  above the goal (<130/80). Just started amlodipine  5 mg daily  yesterday Tolerates irbesartan , metoprolol  well, without any side effects Denies SOB, palpitation, chest pain, headaches,or swelling Reiterated the importance of regular exercise and low salt diet   Plan:  Continue taking amlodipine  5 mg daily, irbesartan  300 mg daily, metoprolol  succ 75 mg daily  Patient to keep record of BP  readings with heart rate and report to us  at the next visit Patient to follow up with me in 2 months  Labs ordered today:  none   Nonischemic cardiomyopathy (HCC) Currently on irbesartan  and metoprolol .  Cardiomyopathy seems to be due to longstanding hypertension and LBBB related dyssynchrony.  No changes at this time.   Mixed hyperlipidemia Assessment: Most recent LDL 83 in 2023 Has been compliant with high intensity statin:  atorvastatin  40 mg daily  Plan: Continue with atorvastatin  40 mg Repeat lipid labs at next visit     Allean Mink PharmD CPP [facility]  [address] [place] [address] [place], [place] [address] [phone]

## 2024-05-03 NOTE — Patient Instructions (Signed)
 Follow up appointment: I'll call you in a week or so to get a January appointment scheduled.  Schedule with Dr. Francyne for March 2026  Take your meds as follows:  AM:  irbesartan  300 mg, levothyroxine  88 mcg, duloxetine  60 mg, pantoprazole  40 mg  PM:  amlodipine  5 mg, atorvastatin  40 mg, metoprolol  succ 75 mg (1.5 tabs)  Check your blood pressure at home twice daily about 3 days per week, and keep record of the readings.  Your blood pressure goal is < 130/80  To check your pressure at home you will need to:  1. Sit up in a chair, with feet flat on the floor and back supported. Do not cross your ankles or legs. 2. Rest your left arm so that the cuff is about heart level. If the cuff goes on your upper arm,  then just relax the arm on the table, arm of the chair or your lap. If you have a wrist cuff, we  suggest relaxing your wrist against your chest (think of it as Pledging the Flag with the  wrong arm).  3. Place the cuff snugly around your arm, about 1 inch above the crook of your elbow. The  cords should be inside the groove of your elbow.  4. Sit quietly, with the cuff in place, for about 5 minutes. After that 5 minutes press the power  button to start a reading. 5. Do not talk or move while the reading is taking place.  6. Record your readings on a sheet of paper. Although most cuffs have a memory, it is often  easier to see a pattern developing when the numbers are all in front of you.  7. You can repeat the reading after 1-3 minutes if it is recommended  Make sure your bladder is empty and you have not had caffeine or tobacco within the last 30 min  Always bring your blood pressure log with you to your appointments. If you have not brought your monitor in to be double checked for accuracy, please bring it to your next appointment.  You can find a list of quality blood pressure cuffs at [email or web address]  Important lifestyle changes to control high blood pressure  Intervention   Effect on the BP  Lose extra pounds and watch your waistline Weight loss is one of the most effective lifestyle changes for controlling blood pressure. If you're overweight or obese, losing even a small amount of weight can help reduce blood pressure. Blood pressure might go down by about 1 millimeter of mercury (mm Hg) with each kilogram (about 2.2 pounds) of weight lost.  Exercise regularly As a general goal, aim for at least 30 minutes of moderate physical activity every day. Regular physical activity can lower high blood pressure by about 5 to 8 mm Hg.  Eat a healthy diet Eating a diet rich in whole grains, fruits, vegetables, and low-fat dairy products and low in saturated fat and cholesterol. A healthy diet can lower high blood pressure by up to 11 mm Hg.  Reduce salt (sodium) in your diet Even a small reduction of sodium in the diet can improve heart health and reduce high blood pressure by about 5 to 6 mm Hg.  Limit alcohol One drink equals 12 ounces of beer, 5 ounces of wine, or 1.5 ounces of 80-proof liquor.  Limiting alcohol to less than one drink a day for women or two drinks a day for men can help lower blood pressure by about 4 mm  Hg.   If you have any questions or concerns please use My Chart to send questions or call the office at [phone]

## 2024-05-05 ENCOUNTER — Ambulatory Visit: Admitting: Physical Medicine & Rehabilitation

## 2024-05-05 ENCOUNTER — Encounter: Payer: Self-pay | Admitting: Pharmacist Clinician (PhC)/ Clinical Pharmacy Specialist

## 2024-05-05 NOTE — Assessment & Plan Note (Signed)
 Assessment: Most recent LDL 83 in 2023 Has been compliant with high intensity statin:  atorvastatin  40 mg daily  Plan: Continue with atorvastatin  40 mg Repeat lipid labs at next visit

## 2024-05-05 NOTE — Assessment & Plan Note (Signed)
 Currently on irbesartan  and metoprolol .  Cardiomyopathy seems to be due to longstanding hypertension and LBBB related dyssynchrony.  No changes at this time.

## 2024-05-05 NOTE — Assessment & Plan Note (Signed)
 Assessment: BP is elevated in office BP 134/78 mmHg;  above the goal (<130/80). Just started amlodipine  5 mg daily yesterday Tolerates irbesartan , metoprolol  well, without any side effects Denies SOB, palpitation, chest pain, headaches,or swelling Reiterated the importance of regular exercise and low salt diet   Plan:  Continue taking amlodipine  5 mg daily, irbesartan  300 mg daily, metoprolol  succ 75 mg daily  Patient to keep record of BP readings with heart rate and report to us  at the next visit Patient to follow up with me in 2 months  Labs ordered today:  none

## 2024-05-11 ENCOUNTER — Other Ambulatory Visit: Payer: Self-pay

## 2024-05-11 ENCOUNTER — Ambulatory Visit (HOSPITAL_BASED_OUTPATIENT_CLINIC_OR_DEPARTMENT_OTHER): Attending: Physical Medicine & Rehabilitation | Admitting: Physical Therapy

## 2024-05-11 DIAGNOSIS — R2681 Unsteadiness on feet: Secondary | ICD-10-CM | POA: Diagnosis present

## 2024-05-11 DIAGNOSIS — M25562 Pain in left knee: Secondary | ICD-10-CM | POA: Diagnosis present

## 2024-05-11 DIAGNOSIS — M6281 Muscle weakness (generalized): Secondary | ICD-10-CM | POA: Diagnosis present

## 2024-05-11 DIAGNOSIS — G8929 Other chronic pain: Secondary | ICD-10-CM | POA: Diagnosis present

## 2024-05-11 DIAGNOSIS — M533 Sacrococcygeal disorders, not elsewhere classified: Secondary | ICD-10-CM | POA: Diagnosis present

## 2024-05-11 DIAGNOSIS — M25561 Pain in right knee: Secondary | ICD-10-CM | POA: Diagnosis present

## 2024-05-11 NOTE — Therapy (Unsigned)
 OUTPATIENT PHYSICAL THERAPY THORACOLUMBAR EVALUATION   Patient Name: Laura [name] MRN: [id number] DOB:04/11/1946, 78 y.o., female Today's Date: 05/12/2024  END OF SESSION:  PT End of Session - 05/12/24 1214     Visit Number 1    Date for Recertification  06/23/24    Progress Note Due on Visit 10    PT Start Time 1446    PT Stop Time 1527    PT Time Calculation (min) 41 min    Activity Tolerance Patient tolerated treatment well;Patient limited by pain;Other (comment)   undecided if she wants to progress with aquatic therapy due to fear of water    Behavior During Therapy Anxious          Past Medical History:  Diagnosis Date   Anemia    Anxiety    Asthma    related to sesonal allergies   Cancer (HCC)    Lung   Cataract    Chronic combined systolic and diastolic CHF, NYHA class 2 (HCC) CARDIOLOGIST-  DR [id number]   Coronary artery disease    Depression    History of colon polyps 07/2019   tubular adenoma - Dr. Luis   History of kidney stones    History of non-ST elevation myocardial infarction (NSTEMI) 11/27/2009   SECONDARY TO TAKOTSUDO SYNDROME (CARDIAC CATH NORMAL)   Hyperlipemia    Hypertension    Hypoglycemia    Hypothyroidism    LBBB (left bundle branch block)    Left ventricular ejection fraction less than 40%    38% PER CARDIOLOGIST NOTE (DR CROITORU)   Memory loss    Mood swings    Myocardial infarction (HCC) 2011   Nonischemic dilated cardiomyopathy (HCC)    MODERATELY DEPRESSED LVF;EF 35-45% by Echo 05/27/11   OSA (obstructive sleep apnea) MODERATE PER STUDY 2005   CPAP NONCOMPLIANT   Pre-diabetes    Pulmonary embolism (HCC)    after the lobectomy   Seasonal allergies    Sleep apnea    SUI (stress urinary incontinence, female)    Past Surgical History:  Procedure Laterality Date   CARDIAC CATHETERIZATION  09-04-1999;  08/25/2004;   12/09/2009  DR FRANCYNE   NORMAL CORONARIES/  APICAL BALLOONING OF LV CONSISTENT WITH TAKOTSUBO SYMPTOMS/ EF  30-35%   CATARACT EXTRACTION W/ INTRAOCULAR LENS  IMPLANT, BILATERAL     CHOLECYSTECTOMY N/A 09/29/2012   Procedure: LAPAROSCOPIC CHOLECYSTECTOMY WITH INTRAOPERATIVE CHOLANGIOGRAM;  Surgeon: Elspeth KYM Schultze, MD;  Location: [facility] OR;  Service: General;  Laterality: N/A;   COLONOSCOPY  07/2020   CYSTOSCOPY N/A 09/19/2012   Procedure: PHYLLIS SIDE;  Surgeon: Alm GORMAN Fragmin, MD;  Location: [facility];  Service: Urology;  Laterality: N/A;   DILATION AND CURETTAGE OF UTERUS     EYE SURGERY Bilateral    cataract removal   INTERCOSTAL NERVE BLOCK  05/30/2021   Procedure: INTERCOSTAL NERVE BLOCK;  Surgeon: Kerrin Elspeth BROCKS, MD;  Location: [facility] OR;  Service: Thoracic;;   KNEE ARTHROSCOPY W/ MENISCECTOMY Right 07/27/2011   MEDIAL AND LATERAL   LOBECTOMY  05/30/2021   Procedure: LEFT LOWER LOBECTOMY;  Surgeon: Kerrin Elspeth BROCKS, MD;  Location: [facility] OR;  Service: Thoracic;;   LYMPH NODE DISSECTION  05/30/2021   Procedure: LYMPH NODE DISSECTION;  Surgeon: Kerrin Elspeth BROCKS, MD;  Location: [facility] OR;  Service: Thoracic;;   NASAL SEPTUM SURGERY  02/28/1979   PUBOVAGINAL SLING N/A 09/19/2012   Procedure: SUBURETHRAL OZIE GLADE;  Surgeon: Alm GORMAN Fragmin, MD;  Location: [facility];  Service: Urology;  Laterality: N/A;   RIGHT URETEROSCOPIC STONE EXTRACTION  08/31/2000   TRANSTHORACIC ECHOCARDIOGRAM  05-27-2011  DR CROITORU   MODERATELY DEPRESSED LVF DUE TO GLOBAL HYPOKINESIS AND MARKED SYSTOLIC ASYNCHRONY/ EF 38%/ MILD LEFT ATRIAL DILATATION   VAGINAL HYSTERECTOMY  06/30/1983   partial   Patient Active Problem List   Diagnosis Date Noted   B12 deficiency 02/17/2024   Anemia 02/15/2024   Sacroiliac joint disease 01/28/2024   Paresthesia 05/28/2022   Chronic low back pain 05/28/2022   Pulmonary embolus, right 01/19/2022   Primary adenocarcinoma of lower lobe of left lung (HCC) 07/10/2021   S/P lobectomy of lung 05/30/2021   Multiple pulmonary nodules  determined by computed tomography of lung 04/18/2021   GERD (gastroesophageal reflux disease) 01/20/2019   Abnormal chest x-ray 10/20/2018   Cough variant asthma vs UACS/vcd 09/19/2018   DOE (dyspnea on exertion) 09/19/2018   Memory loss 10/14/2015   Mixed hyperlipidemia 08/23/2014   Major depressive disorder, recurrent episode, severe (HCC) 09/22/2013   Vitamin D  deficiency 07/28/2013   Medication management 07/28/2013   Prediabetes 07/28/2013   Obstructive sleep apnea 05/27/2013   Arrhythmia 04/14/2013   Palpitations 03/12/2013   Delayed gastric emptying 10/18/2012   Steatohepatitis, nonalcoholic 09/29/2012   Female stress incontinence 09/19/2012   Takotsubo syndrome, June 2011.(normal coronaries) 09/12/2012   Nonischemic cardiomyopathy (HCC) 09/12/2012   Anxiety disorder  09/12/2012   Essential hypertension 09/12/2012   LBBB (left bundle branch block) 09/12/2012   Obesity 09/12/2012   Sleep apnea- non compliant with C-pap 09/12/2012    PCP: Elsie Lesches MD  REFERRING PROVIDER: Prentice Compton MD  REFERRING DIAG: M53.3 (ICD-10-CM) - Sacroiliac joint disease   Rationale for Evaluation and Treatment: Rehabilitation  THERAPY DIAG:  Sacroiliac joint pain  Bilateral chronic knee pain  Muscle weakness (generalized)  Unsteadiness on feet  ONSET DATE: 1 year  SUBJECTIVE:                                                                                                                                                                                           SUBJECTIVE STATEMENT: I have pain in my back and knees bad. I have not been able to keep my house. I walk holding onto furniture.  Have had injections and they have helped some .  He gave me multiple choices as to what I can do for my back and knees.  I have to pick. I do not have good sensation in my feet.  I will not use a cane or walker it will make me feel like I am old. I just don't move around much. I am  afraid of  water .  Not sure I want to do this.   PERTINENT HISTORY:  CHF, CAD  PAIN:  Are you having pain? Yes: NPRS scale: 8/10 Pain location: bilat LB/hips; knees Pain description: ache, sharp Aggravating factors: standing, walking, STS Relieving factors: rest  PRECAUTIONS: Fall and Other: pt afraid of water   RED FLAGS: None   WEIGHT BEARING RESTRICTIONS: No  FALLS:  Has patient fallen in last 6 months? Yes. In garden fell forward  LIVING ENVIRONMENT: Lives with: lives with their spouse Lives in: House/apartment Stairs: No Has following equipment at home: None  OCCUPATION: retired  PLOF: Independent  PATIENT GOALS: get rid of pain, be able to walk  NEXT MD VISIT: as needed  OBJECTIVE:  Note: Objective measures were completed at Evaluation unless otherwise noted.  DIAGNOSTIC FINDINGS:  None in chart  PATIENT SURVEYS:  LEFS:15/80   POSTURE: tba next session  PALPATION: Moderate TTP throughout R/L SIJ Crepitus bilat knee  TBT NEXT SESSION AS TIME RAN OUT DURING EVAL:  LUMBAR ROM:   AROM eval  Flexion   Extension   Right lateral flexion   Left lateral flexion   Right rotation   Left rotation    (Blank rows = not tested)  LOWER EXTREMITY ROM:     Active  Right eval Left eval  Hip flexion    Hip extension    Hip abduction    Hip adduction    Hip internal rotation    Hip external rotation    Knee flexion    Knee extension    Ankle dorsiflexion    Ankle plantarflexion    Ankle inversion    Ankle eversion     (Blank rows = not tested)  LOWER EXTREMITY MMT:    MMT Right eval Left eval  Hip flexion    Hip extension    Hip abduction    Hip adduction    Hip internal rotation    Hip external rotation    Knee flexion    Knee extension    Ankle dorsiflexion    Ankle plantarflexion    Ankle inversion    Ankle eversion     (Blank rows = not tested)  LUMBAR SPECIAL TESTS:  Pt unable to tolerate  FUNCTIONAL TESTS:  TBT next session as ran  out of time during eval  GAIT: Distance walked: 30 ft Assistive device utilized: HHA Level of assistance: Mod A Comments: gait antalgic (bilat knee and LB limitations).    TREATMENT Eval Self care:Posture and optometrist instruction; transfer STS; use of AD's; safety with amb in home without AD; importance of exercise/movement                                                                                                                              PATIENT EDUCATION:  Education details: Discussed eval findings, rehab rationale, aquatic program progression/POC and pools in area. Patient is in agreement  Person educated: Patient  and Spouse Education method: Explanation Education comprehension: verbalized understanding  HOME EXERCISE PROGRAM: TBA  ASSESSMENT:  CLINICAL IMPRESSION: Patient is a 78 y.o. f who was seen today for physical therapy evaluation and treatment for SIJ and bilat knee pain. Pt presents with husband in wc as she was unsteady (as per diplomatic services operational officer) walking into facility from parking lot.  Majority of session spent discussing dx's, role/purpose of aquatic PT. Pt reports being afraid of water  and uncertain if she wants to progress with aquatic therapy.  Pt edu on importance of movement for reducing muscle weakness and managing pain. She is encouraged to be open to using an AD to allow for some level of indep function and safety as she says she doesn't move because she hurts and states and AD will make her look and feel old. Her pain sensitivity is moderate with SI pain> knees.  Majority of objective and functional testing deferred to next session as we ran out of time. Pt willing to trial aquatic therapy for a few sessions to determine if she will want to continue.  She is a good candidate for aquatic intervention and will benefit from the properties of water  to progress towards functional goals if pt able to overcome her fear of water .   OBJECTIVE IMPAIRMENTS: Abnormal  gait, decreased activity tolerance, decreased balance, decreased endurance, decreased knowledge of condition, decreased knowledge of use of DME, decreased mobility, difficulty walking, decreased ROM, decreased strength, postural dysfunction, and pain.   ACTIVITY LIMITATIONS: carrying, lifting, bending, standing, squatting, stairs, transfers, and locomotion level  PARTICIPATION LIMITATIONS: meal prep, cleaning, laundry, driving, shopping, community activity, occupation, yard work, and church  PERSONAL FACTORS: Behavior pattern are also affecting patient's functional outcome.   REHAB POTENTIAL: Fair -good: will progress nicely if fear of water  overcome  CLINICAL DECISION MAKING: Evolving/moderate complexity  EVALUATION COMPLEXITY: High   GOALS: Goals reviewed with patient? Yes  SHORT TERM GOALS: Target date: 06/07/24  Pt will tolerate full aquatic sessions consistently without increase in pain and with improving function to demonstrate good toleration and effectiveness of intervention.  Baseline: Goal status: INITIAL  2.  Pt will tolerate stair climbing  ascending and descending 6 steps withuse of handrail Baseline:  Goal status: INITIAL  3. Pt will overcome her fear of water  to benefit from its properties to progress function and manage pain Baseline:  Goal status: INITIAL   LONG TERM GOALS: Target date: 06/23/24  Pt to improve on LEFS by at least 9 point to demonstrate statistically significant Improvement in function. Baseline: LEFS:15/80 Goal status: INITIAL  2.  Pt will report decrease in pain by at least 50% for improved toleration to activity/quality of life and to demonstrate improved management of pain. Baseline:  Goal status: INITIAL  3.  Pt will tolerate walking to and from setting using AD as appropriate and engaging in aquatic therapy session without excessive fatigue or increase in pain to demonstrate improved toleration to activity.  Baseline:  Goal status:  INITIAL    PLAN:  PT FREQUENCY: 1-2x/week  PT DURATION: 6 weeks  PLANNED INTERVENTIONS: 97164- PT Re-evaluation, 97750- Physical Performance Testing, 97110-Therapeutic exercises, 97530- Therapeutic activity, [id number]- Neuromuscular re-education, 97535- Self Care, [id number]- Manual therapy, [phone]- Gait training, [phone]- Aquatic Therapy, [phone]- Electrical stimulation (unattended), [phone]- Electrical stimulation (manual), [id number]- Ionotophoresis 4mg /ml Dexamethasone , 79439 (1-2 muscles), [id number] (3+ muscles)- Dry Needling, Patient/Family education, Balance training, Stair training, Taping, Joint mobilization, DME instructions, Cryotherapy, and Moist heat.  PLAN FOR NEXT SESSION: trial aquatics for toleration: for  pain management and strengthening/gait   Ronal Foots) Shaquoia Miers MPT 05/12/24 12:22 PM [facility] [address] [place], [place], [address] Phone: [phone]   Fax:  [phone]

## 2024-05-12 ENCOUNTER — Encounter (HOSPITAL_BASED_OUTPATIENT_CLINIC_OR_DEPARTMENT_OTHER): Payer: Self-pay | Admitting: Physical Therapy

## 2024-05-17 ENCOUNTER — Ambulatory Visit (HOSPITAL_BASED_OUTPATIENT_CLINIC_OR_DEPARTMENT_OTHER): Admitting: Physical Therapy

## 2024-05-17 ENCOUNTER — Encounter (HOSPITAL_BASED_OUTPATIENT_CLINIC_OR_DEPARTMENT_OTHER): Payer: Self-pay | Admitting: Physical Therapy

## 2024-05-17 DIAGNOSIS — M6281 Muscle weakness (generalized): Secondary | ICD-10-CM

## 2024-05-17 DIAGNOSIS — G8929 Other chronic pain: Secondary | ICD-10-CM

## 2024-05-17 DIAGNOSIS — M533 Sacrococcygeal disorders, not elsewhere classified: Secondary | ICD-10-CM | POA: Diagnosis not present

## 2024-05-17 NOTE — Therapy (Signed)
 OUTPATIENT PHYSICAL THERAPY THORACOLUMBAR TREATMENT   Patient Name: Laura [name] MRN: [id number] DOB:1945/09/12, 78 y.o., female Today's Date: 05/17/2024  END OF SESSION:  PT End of Session - 05/17/24 1517     Visit Number 2    Date for Recertification  06/23/24    Progress Note Due on Visit 10    PT Start Time 1446    PT Stop Time 1525    PT Time Calculation (min) 39 min    Activity Tolerance Patient limited by pain;Other (comment);Patient limited by fatigue   undecided if she wants to progress with aquatic therapy due to fear of water    Behavior During Therapy Anxious          Past Medical History:  Diagnosis Date   Anemia    Anxiety    Asthma    related to sesonal allergies   Cancer (HCC)    Lung   Cataract    Chronic combined systolic and diastolic CHF, NYHA class 2 (HCC) CARDIOLOGIST-  DR RMNPUNML   Coronary artery disease    Depression    History of colon polyps 07/2019   tubular adenoma - Dr. Luis   History of kidney stones    History of non-ST elevation myocardial infarction (NSTEMI) 11/27/2009   SECONDARY TO TAKOTSUDO SYNDROME (CARDIAC CATH NORMAL)   Hyperlipemia    Hypertension    Hypoglycemia    Hypothyroidism    LBBB (left bundle branch block)    Left ventricular ejection fraction less than 40%    38% PER CARDIOLOGIST NOTE (DR CROITORU)   Memory loss    Mood swings    Myocardial infarction (HCC) 2011   Nonischemic dilated cardiomyopathy (HCC)    MODERATELY DEPRESSED LVF;EF 35-45% by Echo 05/27/11   OSA (obstructive sleep apnea) MODERATE PER STUDY 2005   CPAP NONCOMPLIANT   Pre-diabetes    Pulmonary embolism (HCC)    after the lobectomy   Seasonal allergies    Sleep apnea    SUI (stress urinary incontinence, female)    Past Surgical History:  Procedure Laterality Date   CARDIAC CATHETERIZATION  09-04-1999;  08/25/2004;   12/09/2009  DR FRANCYNE   NORMAL CORONARIES/  APICAL BALLOONING OF LV CONSISTENT WITH TAKOTSUBO SYMPTOMS/ EF 30-35%    CATARACT EXTRACTION W/ INTRAOCULAR LENS  IMPLANT, BILATERAL     CHOLECYSTECTOMY N/A 09/29/2012   Procedure: LAPAROSCOPIC CHOLECYSTECTOMY WITH INTRAOPERATIVE CHOLANGIOGRAM;  Surgeon: Elspeth KYM Schultze, MD;  Location: [facility] OR;  Service: General;  Laterality: N/A;   COLONOSCOPY  07/2020   CYSTOSCOPY N/A 09/19/2012   Procedure: PHYLLIS SIDE;  Surgeon: Alm GORMAN Fragmin, MD;  Location: [facility];  Service: Urology;  Laterality: N/A;   DILATION AND CURETTAGE OF UTERUS     EYE SURGERY Bilateral    cataract removal   INTERCOSTAL NERVE BLOCK  05/30/2021   Procedure: INTERCOSTAL NERVE BLOCK;  Surgeon: Kerrin Elspeth BROCKS, MD;  Location: [facility] OR;  Service: Thoracic;;   KNEE ARTHROSCOPY W/ MENISCECTOMY Right 07/27/2011   MEDIAL AND LATERAL   LOBECTOMY  05/30/2021   Procedure: LEFT LOWER LOBECTOMY;  Surgeon: Kerrin Elspeth BROCKS, MD;  Location: [facility] OR;  Service: Thoracic;;   LYMPH NODE DISSECTION  05/30/2021   Procedure: LYMPH NODE DISSECTION;  Surgeon: Kerrin Elspeth BROCKS, MD;  Location: [facility] OR;  Service: Thoracic;;   NASAL SEPTUM SURGERY  02/28/1979   PUBOVAGINAL SLING N/A 09/19/2012   Procedure: SUBURETHRAL OZIE GLADE;  Surgeon: Alm GORMAN Fragmin, MD;  Location: [facility];  Service: Urology;  Laterality: N/A;   RIGHT URETEROSCOPIC STONE EXTRACTION  08/31/2000   TRANSTHORACIC ECHOCARDIOGRAM  05-27-2011  DR CROITORU   MODERATELY DEPRESSED LVF DUE TO GLOBAL HYPOKINESIS AND MARKED SYSTOLIC ASYNCHRONY/ EF 38%/ MILD LEFT ATRIAL DILATATION   VAGINAL HYSTERECTOMY  06/30/1983   partial   Patient Active Problem List   Diagnosis Date Noted   B12 deficiency 02/17/2024   Anemia 02/15/2024   Sacroiliac joint disease 01/28/2024   Paresthesia 05/28/2022   Chronic low back pain 05/28/2022   Pulmonary embolus, right 01/19/2022   Primary adenocarcinoma of lower lobe of left lung (HCC) 07/10/2021   S/P lobectomy of lung 05/30/2021   Multiple pulmonary nodules determined by  computed tomography of lung 04/18/2021   GERD (gastroesophageal reflux disease) 01/20/2019   Abnormal chest x-ray 10/20/2018   Cough variant asthma vs UACS/vcd 09/19/2018   DOE (dyspnea on exertion) 09/19/2018   Memory loss 10/14/2015   Mixed hyperlipidemia 08/23/2014   Major depressive disorder, recurrent episode, severe (HCC) 09/22/2013   Vitamin D  deficiency 07/28/2013   Medication management 07/28/2013   Prediabetes 07/28/2013   Obstructive sleep apnea 05/27/2013   Arrhythmia 04/14/2013   Palpitations 03/12/2013   Delayed gastric emptying 10/18/2012   Steatohepatitis, nonalcoholic 09/29/2012   Female stress incontinence 09/19/2012   Takotsubo syndrome, June 2011.(normal coronaries) 09/12/2012   Nonischemic cardiomyopathy (HCC) 09/12/2012   Anxiety disorder  09/12/2012   Essential hypertension 09/12/2012   LBBB (left bundle branch block) 09/12/2012   Obesity 09/12/2012   Sleep apnea- non compliant with C-pap 09/12/2012    PCP: Elsie Lesches MD  REFERRING PROVIDER: Prentice Compton MD  REFERRING DIAG: M53.3 (ICD-10-CM) - Sacroiliac joint disease   Rationale for Evaluation and Treatment: Rehabilitation  THERAPY DIAG:  Sacroiliac joint pain  Bilateral chronic knee pain  Muscle weakness (generalized)  ONSET DATE: 1 year  SUBJECTIVE:                                                                                                                                                                                           SUBJECTIVE STATEMENT: No changes.  Pain already high 7/10   Initial Subjective I have pain in my back and knees bad. I have not been able to keep my house. I walk holding onto furniture.  Have had injections and they have helped some .  He gave me multiple choices as to what I can do for my back and knees.  I have to pick. I do not have good sensation in my feet.  I will not use a cane or walker it will make me feel like I am old. I  just don't move around  much. I am afraid of water .  Not sure I want to do this.   PERTINENT HISTORY:  CHF, CAD  PAIN:  Are you having pain? Yes: NPRS scale: 8/10 Pain location: bilat LB/hips; knees Pain description: ache, sharp Aggravating factors: standing, walking, STS Relieving factors: rest  PRECAUTIONS: Fall and Other: pt afraid of water   RED FLAGS: None   WEIGHT BEARING RESTRICTIONS: No  FALLS:  Has patient fallen in last 6 months? Yes. In garden fell forward  LIVING ENVIRONMENT: Lives with: lives with their spouse Lives in: House/apartment Stairs: No Has following equipment at home: None  OCCUPATION: retired  PLOF: Independent  PATIENT GOALS: get rid of pain, be able to walk  NEXT MD VISIT: as needed  OBJECTIVE:  Note: Objective measures were completed at Evaluation unless otherwise noted.  DIAGNOSTIC FINDINGS:  None in chart  PATIENT SURVEYS:  LEFS:15/80   POSTURE: tba next session  PALPATION: Moderate TTP throughout R/L SIJ Crepitus bilat knee  TBT NEXT SESSION AS TIME RAN OUT DURING EVAL:  LUMBAR ROM:   AROM eval  Flexion   Extension   Right lateral flexion   Left lateral flexion   Right rotation   Left rotation    (Blank rows = not tested)  LOWER EXTREMITY ROM:     Active  Right eval Left eval  Hip flexion    Hip extension    Hip abduction    Hip adduction    Hip internal rotation    Hip external rotation    Knee flexion    Knee extension    Ankle dorsiflexion    Ankle plantarflexion    Ankle inversion    Ankle eversion     (Blank rows = not tested)  LOWER EXTREMITY MMT:    MMT Right eval Left eval  Hip flexion    Hip extension    Hip abduction    Hip adduction    Hip internal rotation    Hip external rotation    Knee flexion    Knee extension    Ankle dorsiflexion    Ankle plantarflexion    Ankle inversion    Ankle eversion     (Blank rows = not tested)  LUMBAR SPECIAL TESTS:  Pt unable to tolerate  FUNCTIONAL TESTS:  TBT  next session as ran out of time during eval  GAIT: Distance walked: 30 ft Assistive device utilized: HHA Level of assistance: Mod A Comments: gait antalgic (bilat knee and LB limitations).    TREATMENT [facility] Adult PT Treatment:                                                DATE: 05/17/24 Pt seen for aquatic therapy today.  Treatment took place in water  3.5-4.75 ft in depth at the [facility] pool. Temp of water  was 91.  Pt entered/exited the pool via lift   *HHA amb x 15 ft to then from lift to bathroom *Intro to setting *seated on lift: LAQ (increases back pain) *transfer STS instruction submerged  *standing ue support wall gaining balance *side stepping ue support wall  to 4.0 ft: toe raises; marching x 5: hip add/abd not tolerated *side stepping back to lift then exit   Pt requires the buoyancy and hydrostatic pressure of water  for support, and to offload joints by unweighting joint  load by at least 50 % in navel deep water  and by at least 75-80% in chest to neck deep water .  Viscosity of the water  is needed for resistance of strengthening. Water  current perturbations provides challenge to standing balance requiring increased core activation.                                                                                                                                 PATIENT EDUCATION:  Education details: Discussed eval findings, rehab rationale, aquatic program progression/POC and pools in area. Patient is in agreement  Person educated: Patient and Spouse Education method: Explanation Education comprehension: verbalized understanding  HOME EXERCISE PROGRAM: TBA  ASSESSMENT:  CLINICAL IMPRESSION: Pt baseline apprehensive with aquatic entrance as per eval.  She reports increased pain for past 24 hours without gaining relief from any meds.  She enters/exits pool via lift.  Pt tolerates a very short time submerged due to increase pain likely from guarding due to  apprehension and baseline pain arriving.  Trialed side stepping to greater submersion chest deep with only minor reduction in pain.  She does not tolerate hip abd.  Seated pt has increased LBP with reports of anterior radiation into abdomin.  Uncertain if pt will return.  Spoke with husband and pt in regards to possibly moving injection up and trying aquatics about a week after for better toleration.  She will call and cancel next appt if she feels like she doesn't want a 2nd trial.       Initial Impression Patient is a 78 y.o. f who was seen today for physical therapy evaluation and treatment for SIJ and bilat knee pain. Pt presents with husband in wc as she was unsteady (as per diplomatic services operational officer) walking into facility from parking lot.  Majority of session spent discussing dx's, role/purpose of aquatic PT. Pt reports being afraid of water  and uncertain if she wants to progress with aquatic therapy.  Pt edu on importance of movement for reducing muscle weakness and managing pain. She is encouraged to be open to using an AD to allow for some level of indep function and safety as she says she doesn't move because she hurts and states and AD will make her look and feel old. Her pain sensitivity is moderate with SI pain> knees.  Majority of objective and functional testing deferred to next session as we ran out of time. Pt willing to trial aquatic therapy for a few sessions to determine if she will want to continue.  She is a good candidate for aquatic intervention and will benefit from the properties of water  to progress towards functional goals if pt able to overcome her fear of water .   OBJECTIVE IMPAIRMENTS: Abnormal gait, decreased activity tolerance, decreased balance, decreased endurance, decreased knowledge of condition, decreased knowledge of use of DME, decreased mobility, difficulty walking, decreased ROM, decreased strength, postural dysfunction, and pain.   ACTIVITY LIMITATIONS: carrying, lifting,  bending, standing,  squatting, stairs, transfers, and locomotion level  PARTICIPATION LIMITATIONS: meal prep, cleaning, laundry, driving, shopping, community activity, occupation, yard work, and church  PERSONAL FACTORS: Behavior pattern are also affecting patient's functional outcome.   REHAB POTENTIAL: Fair -good: will progress nicely if fear of water  overcome  CLINICAL DECISION MAKING: Evolving/moderate complexity  EVALUATION COMPLEXITY: High   GOALS: Goals reviewed with patient? Yes  SHORT TERM GOALS: Target date: 06/07/24  Pt will tolerate full aquatic sessions consistently without increase in pain and with improving function to demonstrate good toleration and effectiveness of intervention.  Baseline: Goal status: INITIAL  2.  Pt will tolerate stair climbing  ascending and descending 6 steps withuse of handrail Baseline:  Goal status: INITIAL  3. Pt will overcome her fear of water  to benefit from its properties to progress function and manage pain Baseline:  Goal status: INITIAL   LONG TERM GOALS: Target date: 06/23/24  Pt to improve on LEFS by at least 9 point to demonstrate statistically significant Improvement in function. Baseline: LEFS:15/80 Goal status: INITIAL  2.  Pt will report decrease in pain by at least 50% for improved toleration to activity/quality of life and to demonstrate improved management of pain. Baseline:  Goal status: INITIAL  3.  Pt will tolerate walking to and from setting using AD as appropriate and engaging in aquatic therapy session without excessive fatigue or increase in pain to demonstrate improved toleration to activity.  Baseline:  Goal status: INITIAL    PLAN:  PT FREQUENCY: 1-2x/week  PT DURATION: 6 weeks  PLANNED INTERVENTIONS: 97164- PT Re-evaluation, 97750- Physical Performance Testing, 97110-Therapeutic exercises, 97530- Therapeutic activity, [id number]- Neuromuscular re-education, 97535- Self Care, [id number]- Manual therapy,  [phone]- Gait training, [phone]- Aquatic Therapy, [phone]- Electrical stimulation (unattended), [phone]- Electrical stimulation (manual), [id number]- Ionotophoresis 4mg /ml Dexamethasone , 79439 (1-2 muscles), 20561 (3+ muscles)- Dry Needling, Patient/Family education, Balance training, Stair training, Taping, Joint mobilization, DME instructions, Cryotherapy, and Moist heat.  PLAN FOR NEXT SESSION: trial aquatics for toleration: for pain management and strengthening/gait   Ronal Foots) Alicyn Klann MPT 05/17/24 4:17 PM [facility] [address] [place], [place], [address] Phone: [phone]   Fax:  [phone]

## 2024-05-18 ENCOUNTER — Other Ambulatory Visit (HOSPITAL_COMMUNITY): Payer: Self-pay

## 2024-05-18 ENCOUNTER — Encounter: Payer: Self-pay | Admitting: Obstetrics and Gynecology

## 2024-05-18 ENCOUNTER — Inpatient Hospital Stay

## 2024-05-18 ENCOUNTER — Other Ambulatory Visit: Payer: Self-pay

## 2024-05-18 ENCOUNTER — Ambulatory Visit (INDEPENDENT_AMBULATORY_CARE_PROVIDER_SITE_OTHER): Admitting: Obstetrics and Gynecology

## 2024-05-18 VITALS — BP 167/75 | HR 93

## 2024-05-18 DIAGNOSIS — R350 Frequency of micturition: Secondary | ICD-10-CM | POA: Diagnosis not present

## 2024-05-18 MED ORDER — VIBEGRON 75 MG PO TABS
75.0000 mg | ORAL_TABLET | Freq: Every day | ORAL | 5 refills | Status: AC
  Filled 2024-05-18 – 2024-05-30 (×3): qty 30, 30d supply, fill #0

## 2024-05-18 NOTE — Progress Notes (Signed)
 [facility] Return Visit  SUBJECTIVE  History of Present Illness: Laura [name] is a 78 y.o. female seen in follow-up for OAB. Plan at last visit was Start Gemtesa  75mg  daily.   Patient had previously failed Trospium 20mg  x2 daily.   Patient has been working with pain management still regarding her back, knee, and sacroiliac pain. This is minimally improved and she is continuing to work on this.   Patient reports she felt like the Gemtesa  75mg  daily really helped her urgency to make it to the bathroom. She states when she has the severe back pain and then has the urgency it is very difficult for her to make it in time but the Gemtesa  has been effective in giving her time to make it to the bathroom.    Past Medical History: Patient  has a past medical history of Anemia, Anxiety, Asthma, Cancer (HCC), Cataract, Chronic combined systolic and diastolic CHF, NYHA class 2 (HCC) (CARDIOLOGIST-  DR RMNPUNML), Coronary artery disease, Depression, History of colon polyps (07/2019), History of kidney stones, History of non-ST elevation myocardial infarction (NSTEMI) (11/27/2009), Hyperlipemia, Hypertension, Hypoglycemia, Hypothyroidism, LBBB (left bundle branch block), Left ventricular ejection fraction less than 40%, Memory loss, Mood swings, Myocardial infarction (HCC) (2011), Nonischemic dilated cardiomyopathy (HCC), OSA (obstructive sleep apnea) (MODERATE PER STUDY 2005), Pre-diabetes, Pulmonary embolism (HCC), Seasonal allergies, Sleep apnea, and SUI (stress urinary incontinence, female).   Past Surgical History: She  has a past surgical history that includes Nasal septum surgery (02/28/1979); Vaginal hysterectomy (06/30/1983); Cataract extraction w/ intraocular lens  implant, bilateral; RIGHT URETEROSCOPIC STONE EXTRACTION (08/31/2000); Knee arthroscopy w/ meniscectomy (Right, 07/27/2011); transthoracic echocardiogram (05-27-2011  DR CROITORU); Cardiac catheterization (09-04-1999;   08/25/2004;   12/09/2009  DR CROITORU); Pubovaginal sling (N/A, 09/19/2012); Cystoscopy (N/A, 09/19/2012); Cholecystectomy (N/A, 09/29/2012); Colonoscopy (07/2020); Eye surgery (Bilateral); Dilation and curettage of uterus; Lobectomy (05/30/2021); Intercostal nerve block (05/30/2021); and Lymph node dissection (05/30/2021).   Medications: She has a current medication list which includes the following prescription(s): amlodipine , atorvastatin , diazepam , doxycycline , duloxetine , irbesartan , levothyroxine , melatonin, metoprolol  succinate, nuvigil , oxycodone , pantoprazole , polyethylene glycol, and vibegron , and the following Facility-Administered Medications: sodium chloride , betamethasone  acetate-betamethasone  sodium phosphate , iohexol , lidocaine , and lidocaine  hcl (pf).   Allergies: Patient is allergic to codeine, ciprofloxacin, cyanocobalamin , vortioxetine, ace inhibitors, citalopram , fetzima [levomilnacipran], lasix [furosemide], linzess [linaclotide], nsaids, sulfamethoxazole-trimethoprim, and xanax xr [alprazolam er].   Social History: Patient  reports that she has never smoked. She has never used smokeless tobacco. She reports that she does not drink alcohol and does not use drugs.     OBJECTIVE     Physical Exam: Vitals:   05/18/24 1446 05/18/24 1521  BP: (!) 179/69 (!) 167/75  Pulse: 74 93   Gen: No apparent distress, A&O x 3.  Detailed Urogynecologic Evaluation:  Deferred.    ASSESSMENT AND PLAN    Laura [name] is a 78 y.o. with:  1. Urinary frequency    Will re-try filling the Gemtesa . Patient is not a candidate for other anticholinergics and has failed Trospium 20mg  x2 daily previously. She is not a candidate for Myrbetriq due to her BP issues and need for medication related to her BP. Patient given 4 weeks of samples while we work on a PA or Tier exception. We can also consider patient assistance program with [facility] pharmacy if needed.   Will follow up with patient once we talk  to pharmacy.  Laura Largo G Adyan Palau, NP

## 2024-05-24 ENCOUNTER — Ambulatory Visit (HOSPITAL_BASED_OUTPATIENT_CLINIC_OR_DEPARTMENT_OTHER): Admitting: Physical Therapy

## 2024-05-29 ENCOUNTER — Other Ambulatory Visit (HOSPITAL_COMMUNITY): Payer: Self-pay

## 2024-05-30 ENCOUNTER — Other Ambulatory Visit (HOSPITAL_COMMUNITY): Payer: Self-pay

## 2024-05-30 ENCOUNTER — Encounter: Payer: Self-pay | Admitting: Physician Assistant

## 2024-05-31 ENCOUNTER — Ambulatory Visit (HOSPITAL_BASED_OUTPATIENT_CLINIC_OR_DEPARTMENT_OTHER): Admitting: Physical Therapy

## 2024-06-01 ENCOUNTER — Encounter: Payer: Self-pay | Admitting: Physical Medicine & Rehabilitation

## 2024-06-01 ENCOUNTER — Encounter: Attending: Physical Medicine & Rehabilitation | Admitting: Physical Medicine & Rehabilitation

## 2024-06-01 VITALS — BP 153/83 | HR 95 | Ht 63.0 in | Wt 168.2 lb

## 2024-06-01 DIAGNOSIS — M533 Sacrococcygeal disorders, not elsewhere classified: Secondary | ICD-10-CM | POA: Insufficient documentation

## 2024-06-01 DIAGNOSIS — M1711 Unilateral primary osteoarthritis, right knee: Secondary | ICD-10-CM | POA: Insufficient documentation

## 2024-06-01 DIAGNOSIS — M1712 Unilateral primary osteoarthritis, left knee: Secondary | ICD-10-CM | POA: Insufficient documentation

## 2024-06-01 MED ORDER — TRIAMCINOLONE ACETONIDE 32 MG IX SRER
32.0000 mg | Freq: Once | INTRA_ARTICULAR | Status: AC
  Administered 2024-06-01: 32 mg via INTRA_ARTICULAR

## 2024-06-01 NOTE — Progress Notes (Signed)
 Knee injection Right   Indication:Right  Knee pain not relieved by medication management and other conservative care.  Informed consent was obtained after describing risks and benefits of the procedure with the patient, this includes bleeding, bruising, infection and medication side effects. The patient wishes to proceed and has given written consent. The patient was placed in a recumbent position. The medial aspect of the knee was marked and prepped with Betadine  and alcohol. It was then entered with a 21g 2needle was inserted into the knee joint. After negative draw back for blood, a solution 5ml Zilretta  (32mg  triamcinolone ) was injected  The patient tolerated the procedure well. Post procedure instructions were given.

## 2024-06-14 NOTE — Progress Notes (Deleted)
 [facility] OFFICE PROGRESS NOTE  [name], MD [address] [place] [address]  DIAGNOSIS:  1) Stage IA (T1c, N0, M0) non-small cell lung cancer, adenocarcinoma diagnosed in December 2022 2) incidental finding of pulmonary embolus within the right middle lobar pulmonary artery on CT scan of 01/13/2022  PRIOR THERAPY: 1) Status post left lower lobectomy with lymph node dissection under the care of Dr. Kerrin on May 30, 2021. 2) Eliquis  5 mg p.o. twice daily started January 13, 2022.  She will complete 6 months of her treatment this month.  CURRENT THERAPY: Observation   INTERVAL HISTORY: Laura [name] 78 y.o. female returns to the clinic today for a follow-up visit.  The patient was last seen in the clinic in August 2025.    MEDICAL HISTORY: Past Medical History:  Diagnosis Date   Anemia    Anxiety    Asthma    related to sesonal allergies   Cancer (HCC)    Lung   Cataract    Chronic combined systolic and diastolic CHF, NYHA class 2 (HCC) CARDIOLOGIST-  DR [id number]   Coronary artery disease    Depression    History of colon polyps 07/2019   tubular adenoma - Dr. Luis   History of kidney stones    History of non-ST elevation myocardial infarction (NSTEMI) 11/27/2009   SECONDARY TO TAKOTSUDO SYNDROME (CARDIAC CATH NORMAL)   Hyperlipemia    Hypertension    Hypoglycemia    Hypothyroidism    LBBB (left bundle branch block)    Left ventricular ejection fraction less than 40%    38% PER CARDIOLOGIST NOTE (DR CROITORU)   Memory loss    Mood swings    Myocardial infarction (HCC) 2011   Nonischemic dilated cardiomyopathy (HCC)    MODERATELY DEPRESSED LVF;EF 35-45% by Echo 05/27/11   OSA (obstructive sleep apnea) MODERATE PER STUDY 2005   CPAP NONCOMPLIANT   Pre-diabetes    Pulmonary embolism (HCC)    after the lobectomy   Seasonal allergies    Sleep apnea    SUI (stress urinary incontinence, female)     ALLERGIES:  is  allergic to codeine, ciprofloxacin, cyanocobalamin , vortioxetine, ace inhibitors, citalopram , fetzima [levomilnacipran], lasix [furosemide], linzess [linaclotide], nsaids, sulfamethoxazole-trimethoprim, and xanax xr [alprazolam er].  MEDICATIONS:  Current Outpatient Medications  Medication Sig Dispense Refill   amLODipine  (NORVASC ) 5 MG tablet Take 1 tablet (5 mg total) by mouth daily. 90 tablet 3   atorvastatin  (LIPITOR ) 40 MG tablet Take 1 tablet (40 mg total) by mouth daily.     diazepam  (VALIUM ) 10 MG tablet Take 10 mg by mouth every 6 (six) hours as needed for anxiety.     DULoxetine  (CYMBALTA ) 60 MG capsule Take 1 capsule by mouth daily.     irbesartan  (AVAPRO ) 300 MG tablet Take 300 mg by mouth daily.     levothyroxine  (SYNTHROID , LEVOTHROID) 88 MCG tablet Take 88 mcg by mouth daily before breakfast.     Melatonin 10 MG TABS Take by mouth at bedtime as needed. Pt taking 21/2 mg a day     metoprolol  succinate (TOPROL -XL) 50 MG 24 hr tablet TAKE ONE AND ONE-HALF TABLETS BY MOUTH DAILY (Patient taking differently: 75 mg daily.) 90 tablet 3   NUVIGIL  250 MG tablet Take 250 mg by mouth daily as needed (energy boost).  3   oxyCODONE  (OXY IR/ROXICODONE ) 5 MG immediate release tablet Take 1 tablet (5 mg total) by mouth every 6 (six) hours  as needed for severe pain (pain score 7-10). 20 tablet 0   pantoprazole  (PROTONIX ) 40 MG tablet Take 1 tablet (40 mg total) by mouth daily. 90 tablet 3   polyethylene glycol (MIRALAX ) 17 g packet Take 17 g by mouth 3 (three) times daily.     Vibegron  75 MG TABS Take 1 tablet (75 mg total) by mouth daily. 30 tablet 5   Current Facility-Administered Medications  Medication Dose Route Frequency Provider Last Rate Last Admin   0.9 %  sodium chloride  infusion  500 mL Intravenous Once Federico Rosario BROCKS, MD       iohexol  (OMNIPAQUE ) 180 MG/ML injection 2 mL  2 mL Other Once        lidocaine  (XYLOCAINE ) 1 % (with pres) injection 5 mL  5 mL Other Once        lidocaine   HCl (PF) (XYLOCAINE ) 2 % injection 2 mL  2 mL Other Once         SURGICAL HISTORY:  Past Surgical History:  Procedure Laterality Date   CARDIAC CATHETERIZATION  09-04-1999;  08/25/2004;   12/09/2009  DR FRANCYNE   NORMAL CORONARIES/  APICAL BALLOONING OF LV CONSISTENT WITH TAKOTSUBO SYMPTOMS/ EF 30-35%   CATARACT EXTRACTION W/ INTRAOCULAR LENS  IMPLANT, BILATERAL     CHOLECYSTECTOMY N/A 09/29/2012   Procedure: LAPAROSCOPIC CHOLECYSTECTOMY WITH INTRAOPERATIVE CHOLANGIOGRAM;  Surgeon: Elspeth KYM Schultze, MD;  Location: [facility] OR;  Service: General;  Laterality: N/A;   COLONOSCOPY  07/2020   CYSTOSCOPY N/A 09/19/2012   Procedure: PHYLLIS SIDE;  Surgeon: Alm GORMAN Fragmin, MD;  Location: [facility];  Service: Urology;  Laterality: N/A;   DILATION AND CURETTAGE OF UTERUS     EYE SURGERY Bilateral    cataract removal   INTERCOSTAL NERVE BLOCK  05/30/2021   Procedure: INTERCOSTAL NERVE BLOCK;  Surgeon: Kerrin Elspeth BROCKS, MD;  Location: [facility] OR;  Service: Thoracic;;   KNEE ARTHROSCOPY W/ MENISCECTOMY Right 07/27/2011   MEDIAL AND LATERAL   LOBECTOMY  05/30/2021   Procedure: LEFT LOWER LOBECTOMY;  Surgeon: Kerrin Elspeth BROCKS, MD;  Location: [facility] OR;  Service: Thoracic;;   LYMPH NODE DISSECTION  05/30/2021   Procedure: LYMPH NODE DISSECTION;  Surgeon: Kerrin Elspeth BROCKS, MD;  [facility];  Service: Thoracic;;   NASAL SEPTUM SURGERY  02/28/1979   PUBOVAGINAL SLING N/A 09/19/2012   Procedure: SUBURETHRAL OZIE GLADE;  Surgeon: Alm GORMAN Fragmin, MD;  Location: [facility];  Service: Urology;  Laterality: N/A;   RIGHT URETEROSCOPIC STONE EXTRACTION  08/31/2000   TRANSTHORACIC ECHOCARDIOGRAM  05-27-2011  DR CROITORU   MODERATELY DEPRESSED LVF DUE TO GLOBAL HYPOKINESIS AND MARKED SYSTOLIC ASYNCHRONY/ EF 38%/ MILD LEFT ATRIAL DILATATION   VAGINAL HYSTERECTOMY  06/30/1983   partial    REVIEW OF SYSTEMS:   Review of Systems  Constitutional: Negative for appetite  change, chills, fatigue, fever and unexpected weight change.  HENT:   Negative for mouth sores, nosebleeds, sore throat and trouble swallowing.   Eyes: Negative for eye problems and icterus.  Respiratory: Negative for cough, hemoptysis, shortness of breath and wheezing.   Cardiovascular: Negative for chest pain and leg swelling.  Gastrointestinal: Negative for abdominal pain, constipation, diarrhea, nausea and vomiting.  Genitourinary: Negative for bladder incontinence, difficulty urinating, dysuria, frequency and hematuria.   Musculoskeletal: Negative for back pain, gait problem, neck pain and neck stiffness.  Skin: Negative for itching and rash.  Neurological: Negative for dizziness, extremity weakness, gait problem, headaches, light-headedness and seizures.  Hematological: Negative  for adenopathy. Does not bruise/bleed easily.  Psychiatric/Behavioral: Negative for confusion, depression and sleep disturbance. The patient is not nervous/anxious.     PHYSICAL EXAMINATION:  There were no vitals taken for this visit.  ECOG PERFORMANCE STATUS: {CHL ONC ECOG [id number]  Physical Exam  Constitutional: Oriented to person, place, and time and well-developed, well-nourished, and in no distress. No distress.  HENT:  Head: Normocephalic and atraumatic.  Mouth/Throat: Oropharynx is clear and moist. No oropharyngeal exudate.  Eyes: Conjunctivae are normal. Right eye exhibits no discharge. Left eye exhibits no discharge. No scleral icterus.  Neck: Normal range of motion. Neck supple.  Cardiovascular: Normal rate, regular rhythm, normal heart sounds and intact distal pulses.   Pulmonary/Chest: Effort normal and breath sounds normal. No respiratory distress. No wheezes. No rales.  Abdominal: Soft. Bowel sounds are normal. Exhibits no distension and no mass. There is no tenderness.  Musculoskeletal: Normal range of motion. Exhibits no edema.  Lymphadenopathy:    No cervical adenopathy.   Neurological: Alert and oriented to person, place, and time. Exhibits normal muscle tone. Gait normal. Coordination normal.  Skin: Skin is warm and dry. No rash noted. Not diaphoretic. No erythema. No pallor.  Psychiatric: Mood, memory and judgment normal.  Vitals reviewed.  LABORATORY DATA: Lab Results  Component Value Date   WBC 7.0 02/15/2024   HGB 11.7 (L) 02/15/2024   HCT 36.8 02/15/2024   MCV 76.3 (L) 02/15/2024   PLT 231 02/15/2024      Chemistry      Component Value Date/Time   NA 140 01/12/2024 1127   NA 144 08/30/2020 1550   K 4.2 01/12/2024 1127   CL 106 01/12/2024 1127   CO2 29 01/12/2024 1127   BUN 15 01/12/2024 1127   BUN 10 08/30/2020 1550   CREATININE 0.73 01/12/2024 1127   CREATININE 0.75 05/15/2015 1306      Component Value Date/Time   CALCIUM  9.5 01/12/2024 1127   ALKPHOS 72 01/12/2024 1127   AST 15 01/12/2024 1127   ALT 10 01/12/2024 1127   BILITOT 0.3 01/12/2024 1127       RADIOGRAPHIC STUDIES:  No results found.   ASSESSMENT/PLAN:  No problem-specific Assessment & Plan notes found for this encounter.   No orders of the defined types were placed in this encounter.    I spent {CHL ONC TIME VISIT - DTPQU:[id number]} counseling the patient face to face. The total time spent in the appointment was {CHL ONC TIME VISIT - DTPQU:[id number]}.  Yaviel Kloster L Denean Pavon, PA-C 06/14/2024

## 2024-06-15 ENCOUNTER — Encounter: Admitting: Physical Medicine & Rehabilitation

## 2024-06-15 ENCOUNTER — Encounter: Payer: Self-pay | Admitting: Physical Medicine & Rehabilitation

## 2024-06-15 DIAGNOSIS — M1711 Unilateral primary osteoarthritis, right knee: Secondary | ICD-10-CM | POA: Diagnosis not present

## 2024-06-15 DIAGNOSIS — M533 Sacrococcygeal disorders, not elsewhere classified: Secondary | ICD-10-CM

## 2024-06-15 MED ORDER — IOHEXOL 180 MG/ML  SOLN
2.0000 mL | Freq: Once | INTRAMUSCULAR | Status: AC
  Administered 2024-06-15: 13:00:00 2 mL

## 2024-06-15 MED ORDER — BETAMETHASONE SOD PHOS & ACET 6 (3-3) MG/ML IJ SUSP
6.0000 mg | Freq: Once | INTRAMUSCULAR | Status: AC
  Administered 2024-06-15: 13:00:00 6 mg

## 2024-06-15 MED ORDER — LIDOCAINE HCL 1 % IJ SOLN
5.0000 mL | Freq: Once | INTRAMUSCULAR | Status: AC
  Administered 2024-06-15: 13:00:00 5 mL

## 2024-06-15 MED ORDER — LIDOCAINE HCL (PF) 2 % IJ SOLN
2.0000 mL | Freq: Once | INTRAMUSCULAR | Status: AC
  Administered 2024-06-15: 13:00:00 2 mL

## 2024-06-15 NOTE — Progress Notes (Signed)
°  PROCEDURE RECORD [facility]   Name: Laura [name] DOB:Nov 09, 1945 MRN: [id number]  Date:06/15/2024  Physician: Prentice Compton, MD    Nurse/CMA: Adin, RN  Allergies: Allergies[1]  Consent Signed: Yes.    Is patient diabetic? No.  CBG today? .  Pregnant: No. LMP: No LMP recorded. Patient is postmenopausal. (age 78-55)  Anticoagulants: no Anti-inflammatory: no Antibiotics: no  Procedure: Bilateral Sacroiliac Injection  Position: Prone Start Time: 1:02  End Time: 1:09  Fluoro Time: 34 sec  RN/CMA Jama, CMA Shumaker, RN    Time 12:12 pm 1:13    BP 170 /92 169/93    Pulse 96 99    Respirations 16 16    O2 Sat 96 96    S/S 6 6    Pain Level 8/10 7/10     D/C home with sister [name], patient A & O X 3, D/C instructions reviewed, and sits independently.           [1]  Allergies Allergen Reactions   Codeine Anaphylaxis, Hives and Other (See Comments)    Headache. Daughter reported that it caused her throat to swell up    Ciprofloxacin Nausea And Vomiting   Cyanocobalamin  Swelling    Lower extremity swelling   Vortioxetine Nausea Only    Other Reaction(s): dizziness   Ace Inhibitors Other (See Comments)    Unknown- it didn't agree with her    Citalopram  Other (See Comments)    Fatigue   Fetzima [Levomilnacipran] Other (See Comments)    Talking out of my head   Lasix [Furosemide] Other (See Comments)    HEADACHE   Linzess [Linaclotide] Nausea Only   Nsaids Other (See Comments)    Told not to take NSAIDs because of her heart    Sulfamethoxazole-Trimethoprim Other (See Comments)    NERVOUS AND DISORENTED   Xanax [place]] Other (See Comments)    confusion

## 2024-06-15 NOTE — Progress Notes (Signed)
Bilateral sacroiliac injections under fluoroscopic guidance  Indication: Low back and buttocks pain not relieved by medication management and other conservative care.  Informed consent was obtained after describing risks and benefits of the procedure with the patient, this includes bleeding, bruising, infection, paralysis and medication side effects. The patient wishes to proceed and has given written consent. The patient was placed in a prone position. The lumbar and sacral area was marked and prepped with Betadine. A 25-gauge 1-1/2 inch needle was inserted into the skin and subcutaneous tissue and 1 mL of 1% lidocaine was injected into each side. Then a 25-gauge 3 inch spinal needle was inserted under fluoroscopic guidance into the left sacroiliac joint. AP and lateral images were utilized. Omnipaque 180x0.5 mL under live fluoroscopy demonstrated no intravascular uptake. Then a solution containing one ML of 6 mg per mL Celestone in 2 ML of 2% lidocaine MPF was injected x1.5 mL. This same procedure was repeated on the right side using the same needle, injectate, and technique. Patient tolerated the procedure well. Post procedure instructions were given. Please see post procedure form.  Meds: Celestone 6mg  Omnipaque 1ml Lidocaine 1% 2ml Lidocaine 2% PF 2 ml

## 2024-06-19 ENCOUNTER — Inpatient Hospital Stay: Attending: Internal Medicine

## 2024-06-19 ENCOUNTER — Other Ambulatory Visit: Payer: Self-pay | Admitting: Physician Assistant

## 2024-06-19 ENCOUNTER — Emergency Department (HOSPITAL_BASED_OUTPATIENT_CLINIC_OR_DEPARTMENT_OTHER): Admitting: Radiology

## 2024-06-19 ENCOUNTER — Inpatient Hospital Stay

## 2024-06-19 ENCOUNTER — Emergency Department (HOSPITAL_BASED_OUTPATIENT_CLINIC_OR_DEPARTMENT_OTHER)
Admission: EM | Admit: 2024-06-19 | Discharge: 2024-06-19 | Disposition: A | Discharge status: Home / Self Care | Attending: Emergency Medicine | Admitting: Emergency Medicine

## 2024-06-19 ENCOUNTER — Other Ambulatory Visit: Payer: Self-pay

## 2024-06-19 ENCOUNTER — Encounter (HOSPITAL_BASED_OUTPATIENT_CLINIC_OR_DEPARTMENT_OTHER): Payer: Self-pay | Admitting: Emergency Medicine

## 2024-06-19 ENCOUNTER — Inpatient Hospital Stay: Admitting: Physician Assistant

## 2024-06-19 DIAGNOSIS — J45909 Unspecified asthma, uncomplicated: Secondary | ICD-10-CM | POA: Diagnosis not present

## 2024-06-19 DIAGNOSIS — E538 Deficiency of other specified B group vitamins: Secondary | ICD-10-CM

## 2024-06-19 DIAGNOSIS — J069 Acute upper respiratory infection, unspecified: Secondary | ICD-10-CM | POA: Diagnosis not present

## 2024-06-19 DIAGNOSIS — R059 Cough, unspecified: Secondary | ICD-10-CM | POA: Diagnosis present

## 2024-06-19 DIAGNOSIS — E039 Hypothyroidism, unspecified: Secondary | ICD-10-CM | POA: Insufficient documentation

## 2024-06-19 DIAGNOSIS — I10 Essential (primary) hypertension: Secondary | ICD-10-CM | POA: Insufficient documentation

## 2024-06-19 DIAGNOSIS — Z85118 Personal history of other malignant neoplasm of bronchus and lung: Secondary | ICD-10-CM | POA: Diagnosis not present

## 2024-06-19 DIAGNOSIS — D509 Iron deficiency anemia, unspecified: Secondary | ICD-10-CM

## 2024-06-19 LAB — RESP PANEL BY RT-PCR (RSV, FLU A&B, COVID)  RVPGX2
Influenza A by PCR: NEGATIVE
Influenza B by PCR: NEGATIVE
Resp Syncytial Virus by PCR: NEGATIVE
SARS Coronavirus 2 by RT PCR: NEGATIVE

## 2024-06-19 NOTE — ED Provider Notes (Signed)
 " [facility] Provider Note   CSN: [id number] Arrival date & time: 06/19/24  9058     Patient presents with: Cough   Laura [name] is a 78 y.o. female who presents to the ED with a chief complaint of headache, sore throat, cough, and body aches. Patient states she has been experiencing symptoms for approximately 3 days, stating that her husband has also been sick recently. Patient also checked in as patient and is in same treatment room. Denies ear pain, chest pain, shortness of breath. Unsure of sick contacts other than husband.  Past medical history significant for anemia, hypothyroidism, hyperlipidemia, lung cancer, asthma, hypertension, pulmonary embolism, myocardial infarction, left bundle branch block, etc. Patient does feel that headache is associated with other URI-like symptoms. Denies vision changes. Denies headache waking her up out of her sleep or headache being worst headache of life.     Cough      Prior to Admission medications  Medication Sig Start Date End Date Taking? Authorizing Provider  amLODipine  (NORVASC ) 5 MG tablet Take 1 tablet (5 mg total) by mouth daily. 03/28/24   Croitoru, Mihai, MD  atorvastatin  (LIPITOR ) 40 MG tablet Take 1 tablet (40 mg total) by mouth daily. 03/29/24 06/27/24  Croitoru, Mihai, MD  diazepam  (VALIUM ) 10 MG tablet Take 10 mg by mouth every 6 (six) hours as needed for anxiety. 09/27/19   [provider]  DULoxetine  (CYMBALTA ) 60 MG capsule Take 1 capsule by mouth daily.    [provider]  irbesartan  (AVAPRO ) 300 MG tablet Take 300 mg by mouth daily.    [provider]  levothyroxine  (SYNTHROID , LEVOTHROID) 88 MCG tablet Take 88 mcg by mouth daily before breakfast.    [provider]  Melatonin 10 MG TABS Take by mouth at bedtime as needed. Pt taking 21/2 mg a day    [provider]  metoprolol  succinate (TOPROL -XL) 50 MG 24 hr tablet TAKE ONE AND ONE-HALF  TABLETS BY MOUTH DAILY Patient taking differently: 75 mg daily. 12/31/20   Burnard Debby LABOR, MD  NUVIGIL  250 MG tablet Take 250 mg by mouth daily as needed (energy boost). 06/14/15   [provider]  oxyCODONE  (OXY IR/ROXICODONE ) 5 MG immediate release tablet Take 1 tablet (5 mg total) by mouth every 6 (six) hours as needed for severe pain (pain score 7-10). 04/27/24   Kirsteins, Prentice BRAVO, MD  pantoprazole  (PROTONIX ) 40 MG tablet Take 1 tablet (40 mg total) by mouth daily. 04/26/24   Craig Alan SAUNDERS, PA-C  polyethylene glycol (MIRALAX ) 17 g packet Take 17 g by mouth 3 (three) times daily. 05/01/23   Federico Rosario BROCKS, MD  Vibegron  75 MG TABS Take 1 tablet (75 mg total) by mouth daily. 05/18/24   Zuleta, Kaitlin G, NP    Allergies: Codeine, Ciprofloxacin, Cyanocobalamin , Vortioxetine, Ace inhibitors, Citalopram , Fetzima [levomilnacipran], Lasix [furosemide], Linzess [linaclotide], Nsaids, Sulfamethoxazole-trimethoprim, and Xanax xr [alprazolam er]    Review of Systems  Respiratory:  Positive for cough.     Updated Vital Signs BP (!) 138/111 (BP Location: Right Arm)   Pulse 86   Temp 99.8 F (37.7 C)   Resp 18   Wt 77 kg   SpO2 98%   BMI 30.07 kg/m   Physical Exam Vitals and nursing note reviewed.  Constitutional:      General: She is awake. She is not in acute distress.    Appearance: Normal appearance. She is not toxic-appearing or diaphoretic.  Comments: Uncomfortable appearing  HENT:     Head: Normocephalic and atraumatic.     Right Ear: Tympanic membrane, ear canal and external ear normal.     Left Ear: Tympanic membrane, ear canal and external ear normal.     Nose: Congestion present.     Mouth/Throat:     Mouth: Mucous membranes are moist. No oral lesions.     Pharynx: No oropharyngeal exudate, posterior oropharyngeal erythema or uvula swelling.     Tonsils: No tonsillar exudate.  Eyes:     General: No visual field deficit or scleral icterus. Neck:      Comments: Patient able to look left, right, touch chin to chest, and look up at the ceiling without significant discomfort Cardiovascular:     Rate and Rhythm: Normal rate and regular rhythm.  Pulmonary:     Effort: Pulmonary effort is normal. No respiratory distress.     Breath sounds: No wheezing, rhonchi or rales.     Comments: Talking in full sentences on room air Musculoskeletal:        General: Normal range of motion.     Cervical back: Normal range of motion.     Right lower leg: No edema.     Left lower leg: No edema.     Comments: Grossly normal ROM of all 4 extremities  Lymphadenopathy:     Cervical: Cervical adenopathy present.  Skin:    General: Skin is warm.     Capillary Refill: Capillary refill takes less than 2 seconds.     Comments: No obvious rashes or lesions  Neurological:     General: No focal deficit present.     Mental Status: She is alert and oriented to person, place, and time.     GCS: GCS eye subscore is 4. GCS verbal subscore is 5. GCS motor subscore is 6.     Cranial Nerves: Cranial nerves 2-12 are intact. No cranial nerve deficit, dysarthria or facial asymmetry.     Sensory: Sensation is intact.     Motor: Motor function is intact.     Coordination: Coordination is intact.     Gait: Gait is intact.     Comments: Vision grossly intact  Psychiatric:        Mood and Affect: Mood normal.        Behavior: Behavior normal. Behavior is cooperative.     (all labs ordered are listed, but only abnormal results are displayed) Labs Reviewed  RESP PANEL BY RT-PCR (RSV, FLU A&B, COVID)  [id number]    EKG: None  Radiology: DG Chest 2 View Result Date: 06/19/2024 EXAM: 2 VIEW(S) XRAY OF THE CHEST 06/19/2024 10:34:00 AM COMPARISON: 03/17/2023 CLINICAL HISTORY: cough FINDINGS: LUNGS AND PLEURA: No focal pulmonary opacity. No pleural effusion. No pneumothorax. HEART AND MEDIASTINUM: Aortic arch atherosclerosis. No acute abnormality of the cardiac and mediastinal  silhouettes. BONES AND SOFT TISSUES: Thoracic spondylosis. No acute osseous abnormality. IMPRESSION: 1. No acute cardiopulmonary findings. 2. Aortic arch atherosclerosis. 3. Thoracic spondylosis. Electronically signed by: Evalene Coho MD 06/19/2024 11:14 AM EST RP Workstation: [id number]     Procedures   Medications Ordered in the ED - No data to display                                  Medical Decision Making Amount and/or Complexity of Data Reviewed Radiology: ordered.   Patient presents to the ED for concern of URI-like  symptoms, this involves an extensive number of treatment options, and is a complaint that carries with it a high risk of complications and morbidity.  The differential diagnosis includes flu, COVID, RSV, pneumonia, asthma exacerbation, meningitis, etc.   Co morbidities that complicate the patient evaluation  anemia, hypothyroidism, hyperlipidemia, lung cancer, asthma, hypertension, pulmonary embolism, myocardial infarction, left bundle branch block   Lab Tests:  I Ordered, and personally interpreted labs.  The pertinent results include: Covid, flu, RSV negative   Imaging Studies ordered:  I ordered imaging studies including CXR I independently visualized and interpreted imaging which showed no acute cardiopulmonary findings I agree with the radiologist interpretation   Medicines ordered and prescription drug management:  I have reviewed the patients home medicines and have made adjustments as needed   Test Considered:  Lab work (CBC, CMP): initially ordered as I offered the patient a fluid bolus, but patient deferred lab work and fluid bolus as well as other symptomatic medications in the ED stating that she could just treat herself at home   Critical Interventions:  none   Problem List / ED Course:  78yof, vital signs stable, temp 99.23F, URI-like symptoms, husband has had similar symptoms as well Husband who is at bedside positive for flu  A, this patient tested negative however I do believe that the patient likely has influenza A Exam reassuring, consistent with possible viral URI, no abnormality with auscultation of heart or lungs, no focal neuro deficit CXR shows no evidence of pneumonia Symptomatic treatment instructions given, initially offered labs as well as bolus as patient is uncomfortable appearing however patient deferred at this time Offered outpatient tamiflu as well which patient deferred stating, It has never worked for me in the past Most likely diagnosis at this time is influenza A infection as patient's husband is in room and tested positive with similar symptoms, no evidence of pneumonia on exam or CXR, clinically no sign of meningitis, no headache red flags also associated URI symptoms, vital signs stable Return precautions given Patient discharged, offered symptomatic treatment outpatient with tessalon  perles which patient also deferred stating she would just get over the counter medications     Reevaluation:  After the interventions noted above, I reevaluated the patient and found that they have :stayed the same   Social Determinants of Health:  none   Dispostion:  After consideration of the diagnostic results and the patients response to treatment, I feel that the patient would benefit from discharge and outpatient therapy as described. Continued monitoring of symptoms, follow-up with PCP.      Final diagnoses:  Viral URI    ED Discharge Orders     None          Janetta Terrall FALCON, [place] 06/19/24 2348  "

## 2024-06-19 NOTE — ED Notes (Signed)
 Pt refuse vitals, told her we needed to get vitals and stated she doesn't want them. RN notified

## 2024-06-19 NOTE — Discharge Instructions (Addendum)
 It was a pleasure taking care of you today.  Based on your history and physical exam as well as your labs and imaging I feel you are safe for discharge. Today you tested negative for the flu however your husband did test positive. Considering you have been exposed to him and have similar symptoms I do think it is most likely that you have the flu. The flu is a virus which typically last 7 to 14 days.  Please continue supportive care at home including over-the-counter Tylenol  to help with pain and fever.  Also recommend Zyrtec or Claritin to help with allergy  symptoms.  You may also use Flonase which is a nose spray to help with congestion.  Please make sure that you are staying hydrated and continuing to eat good meals.  I recommend that you follow-up with your primary care provider in 1 week for a recheck.  If you experience any following symptoms including but not limited to refractory fever, excessive nausea/vomiting, chest pain, shortness of breath, weakness, or other concerning symptom please return to the emergency department or seek further medical care.

## 2024-06-19 NOTE — ED Triage Notes (Signed)
 Pt in with spouse, c/o cough and nasal drainage with HA x 3 days

## 2024-06-20 ENCOUNTER — Telehealth: Payer: Self-pay | Admitting: Physician Assistant

## 2024-06-20 NOTE — Telephone Encounter (Signed)
 Left VM

## 2024-07-10 ENCOUNTER — Encounter: Payer: Self-pay | Admitting: *Deleted

## 2024-07-11 NOTE — Progress Notes (Signed)
 [facility] OFFICE PROGRESS NOTE  [name], MD [address] [place] [address]  DIAGNOSIS:  1) Stage IA (T1c, N0, M0) non-small cell lung cancer, adenocarcinoma diagnosed in December 2022 2) incidental finding of pulmonary embolus within the right middle lobar pulmonary artery on CT scan of 01/13/2022  PRIOR THERAPY: 1) Status post left lower lobectomy with lymph node dissection under the care of Dr. Kerrin on May 30, 2021. 2) Eliquis  5 mg p.o. twice daily started January 13, 2022.  She will complete 6 months of her treatment this month.  CURRENT THERAPY:  1) B12 over the counter, patient reports ankle swelling with B12 injections.   INTERVAL HISTORY: Laura [name] 79 y.o. female returns to the clinic today for a follow-up visit companied by her husband. The patient was last seen in the clinic in August 2025.   The patient is followed for history of stage I lung cancer.  She was also seen for microcytic anemia and endorsing fatigue and lack of energy.  She is not able to tolerate iron supplements due to already struggling with constipation due to pelvic floor dysfunction.  She also was experiencing bloating for which she is on Protonix .  She was treated in the past for H. pylori.  She was found to have B12 deficiency.  She was started on B12 injections but both resulted in transient left ankle swelling.  She switched to oral B12 and she again experienced ankle swelling and she has not experienced any swelling with discontinuation of the supplements.  She continues to experience easy bruising and spontaneous ecchymoses, primarily on her arms, often with minimal trauma. Bruising persists despite discontinuation of anticoagulation, and she denies aspirin  use. She reports that her skin has become thin and fragile since her cancer diagnosis. She denies active bleeding or signs of ongoing blood loss.  She is prescribed amlodipine  and another  antihypertensive for blood pressure control. She is no longer taking anticoagulation and has not noted swelling with her antihypertensives (specifically norvasc ).  Her dietary intake of iron-rich foods is limited due to chronic back and knee pain, which restricts her ability to cook. She has not been taking iron supplements. She underwent a colonoscopy with removal of four polyps and was advised to repeat the procedure every three years. An upper endoscopy identified a hiatal hernia.  She is not able to tolerate iron supplements due to already struggling with constipation due to pelvic floor dysfunction.  She also was experiencing bloating for which she is on Protonix .  She was treated in the past for H. pylori.  She continues to have chronic fatigue and lack of energy.  She has occasional lightheadedness.  She denies any visible bleeding or bruising.  She previously attempted physical therapy, including aquatic therapy, for back pain but was unable to tolerate the exercises due to increased pain.  She is here today for evaluation repeat blood work.   She is due for her next restaging CT scan for her history of lung cancer in July 2026.   MEDICAL HISTORY: Past Medical History:  Diagnosis Date   Anemia    Anxiety    Asthma    related to sesonal allergies   Cancer (HCC)    Lung   Cataract    Chronic combined systolic and diastolic CHF, NYHA class 2 (HCC) CARDIOLOGIST-  DR [id number]   Coronary artery disease    Depression    History of colon polyps 07/2019  tubular adenoma - Dr. Luis   History of kidney stones    History of non-ST elevation myocardial infarction (NSTEMI) 11/27/2009   SECONDARY TO TAKOTSUDO SYNDROME (CARDIAC CATH NORMAL)   Hyperlipemia    Hypertension    Hypoglycemia    Hypothyroidism    LBBB (left bundle branch block)    Left ventricular ejection fraction less than 40%    38% PER CARDIOLOGIST NOTE (DR CROITORU)   Memory loss    Mood swings    Myocardial  infarction [facility]) 2011   Nonischemic dilated cardiomyopathy (HCC)    MODERATELY DEPRESSED LVF;EF 35-45% by Echo 05/27/11   OSA (obstructive sleep apnea) MODERATE PER STUDY 2005   CPAP NONCOMPLIANT   Pre-diabetes    Pulmonary embolism (HCC)    after the lobectomy   Seasonal allergies    Sleep apnea    SUI (stress urinary incontinence, female)     ALLERGIES:  is allergic to codeine, ciprofloxacin, cyanocobalamin , vortioxetine, ace inhibitors, citalopram , fetzima [levomilnacipran], lasix [furosemide], linzess [linaclotide], nsaids, sulfamethoxazole-trimethoprim, and xanax xr [alprazolam er].  MEDICATIONS:  Current Outpatient Medications  Medication Sig Dispense Refill   amLODipine  (NORVASC ) 5 MG tablet Take 1 tablet (5 mg total) by mouth daily. 90 tablet 3   atorvastatin  (LIPITOR ) 40 MG tablet Take 1 tablet (40 mg total) by mouth daily.     diazepam  (VALIUM ) 10 MG tablet Take 10 mg by mouth every 6 (six) hours as needed for anxiety.     irbesartan  (AVAPRO ) 300 MG tablet Take 300 mg by mouth daily.     levothyroxine  (SYNTHROID , LEVOTHROID) 88 MCG tablet Take 88 mcg by mouth daily before breakfast.     metoprolol  succinate (TOPROL -XL) 50 MG 24 hr tablet TAKE ONE AND ONE-HALF TABLETS BY MOUTH DAILY 90 tablet 3   NUVIGIL  250 MG tablet Take 250 mg by mouth daily as needed (energy boost).  3   oxyCODONE  (OXY IR/ROXICODONE ) 5 MG immediate release tablet Take 1 tablet (5 mg total) by mouth every 6 (six) hours as needed for severe pain (pain score 7-10). 20 tablet 0   pantoprazole  (PROTONIX ) 40 MG tablet Take 1 tablet (40 mg total) by mouth daily. 90 tablet 3   polyethylene glycol (MIRALAX ) 17 g packet Take 17 g by mouth 3 (three) times daily.     DULoxetine  (CYMBALTA ) 60 MG capsule Take 1 capsule by mouth daily.     Vibegron  75 MG TABS Take 1 tablet (75 mg total) by mouth daily. 30 tablet 5   Current Facility-Administered Medications  Medication Dose Route Frequency Provider Last Rate Last Admin    0.9 %  sodium chloride  infusion  500 mL Intravenous Once Dorsey, Ying C, MD       iohexol  (OMNIPAQUE ) 180 MG/ML injection 2 mL  2 mL Other Once        lidocaine  (XYLOCAINE ) 1 % (with pres) injection 5 mL  5 mL Other Once        lidocaine  HCl (PF) (XYLOCAINE ) 2 % injection 2 mL  2 mL Other Once         SURGICAL HISTORY:  Past Surgical History:  Procedure Laterality Date   CARDIAC CATHETERIZATION  09-04-1999;  08/25/2004;   12/09/2009  DR FRANCYNE   NORMAL CORONARIES/  APICAL BALLOONING OF LV CONSISTENT WITH TAKOTSUBO SYMPTOMS/ EF 30-35%   CATARACT EXTRACTION W/ INTRAOCULAR LENS  IMPLANT, BILATERAL     CHOLECYSTECTOMY N/A 09/29/2012   Procedure: LAPAROSCOPIC CHOLECYSTECTOMY WITH INTRAOPERATIVE CHOLANGIOGRAM;  Surgeon: Elspeth KYM Schultze, MD;  Location:  [facility] OR;  Service: General;  Laterality: N/A;   COLONOSCOPY  07/2020   CYSTOSCOPY N/A 09/19/2012   Procedure: CYSTOSCOPY FLEXIBLE;  Surgeon: Alm GORMAN Fragmin, MD;  Location: [facility];  Service: Urology;  Laterality: N/A;   DILATION AND CURETTAGE OF UTERUS     EYE SURGERY Bilateral    cataract removal   INTERCOSTAL NERVE BLOCK  05/30/2021   Procedure: INTERCOSTAL NERVE BLOCK;  Surgeon: Kerrin Elspeth BROCKS, MD;  Location: [facility] OR;  Service: Thoracic;;   KNEE ARTHROSCOPY W/ MENISCECTOMY Right 07/27/2011   MEDIAL AND LATERAL   LOBECTOMY  05/30/2021   Procedure: LEFT LOWER LOBECTOMY;  Surgeon: Kerrin Elspeth BROCKS, MD;  Location: [facility] OR;  Service: Thoracic;;   LYMPH NODE DISSECTION  05/30/2021   Procedure: LYMPH NODE DISSECTION;  Surgeon: Kerrin Elspeth BROCKS, MD;  Location: [facility] OR;  Service: Thoracic;;   NASAL SEPTUM SURGERY  02/28/1979   PUBOVAGINAL SLING N/A 09/19/2012   Procedure: SUBURETHRAL OZIE GLADE;  Surgeon: Alm GORMAN Fragmin, MD;  Location: [facility];  Service: Urology;  Laterality: N/A;   RIGHT URETEROSCOPIC STONE EXTRACTION  08/31/2000   TRANSTHORACIC ECHOCARDIOGRAM  05-27-2011  DR CROITORU   MODERATELY  DEPRESSED LVF DUE TO GLOBAL HYPOKINESIS AND MARKED SYSTOLIC ASYNCHRONY/ EF 38%/ MILD LEFT ATRIAL DILATATION   VAGINAL HYSTERECTOMY  06/30/1983   partial    REVIEW OF SYSTEMS:   Review of Systems  Constitutional: Positive for fatigue. Negative for appetite change, chills, fever and unexpected weight change.  HENT: Negative for mouth sores, nosebleeds, sore throat and trouble swallowing.   Eyes: Negative for eye problems and icterus.  Respiratory: Negative for cough, hemoptysis, shortness of breath and wheezing.   Cardiovascular: Negative for chest pain and leg swelling (none at this time).  Gastrointestinal: Negative for abdominal pain, constipation, diarrhea, nausea and vomiting.  Genitourinary: Negative for bladder incontinence, difficulty urinating, dysuria, frequency and hematuria.   Musculoskeletal: Positive for back pain. Negative for gait problem, neck pain and neck stiffness.  Skin: Negative for itching and rash.  Neurological: Negative for dizziness, extremity weakness, gait problem, headaches, light-headedness and seizures.  Hematological: Negative for adenopathy. Does not bruise/bleed easily.  Psychiatric/Behavioral: Negative for confusion, depression and sleep disturbance. The patient is not nervous/anxious.     PHYSICAL EXAMINATION:  Blood pressure (!) 172/81, pulse 85, temperature 97.7 F (36.5 C), resp. rate 18, weight 166 lb 12.8 oz (75.7 kg), SpO2 97%.  ECOG PERFORMANCE STATUS: 1  Physical Exam  Constitutional: Oriented to person, place, and time and elderly appearing female and in no distress.  HENT:  Head: Normocephalic and atraumatic.  Mouth/Throat: Oropharynx is clear and moist. No oropharyngeal exudate.  Eyes: Conjunctivae are normal. Right eye exhibits no discharge. Left eye exhibits no discharge. No scleral icterus.  Neck: Normal range of motion. Neck supple.  Cardiovascular: Normal rate, regular rhythm, normal heart sounds and intact distal pulses.    Pulmonary/Chest: Effort normal and breath sounds normal. No respiratory distress. No wheezes. No rales.  Abdominal: Soft. Bowel sounds are normal. Exhibits no distension and no mass. There is no tenderness.  Musculoskeletal: Normal range of motion. Exhibits no edema.  Lymphadenopathy:    No cervical adenopathy.  Neurological: Alert and oriented to person, place, and time. Exhibits muscle wasting. Gait normal. Coordination normal.  Skin: Skin is warm and dry. No rash noted. Not diaphoretic. No erythema. No pallor.  Psychiatric: Mood, memory and judgment normal.  Vitals reviewed.  LABORATORY DATA: Lab Results  Component Value Date  WBC 6.5 07/21/2024   HGB 11.9 (L) 07/21/2024   HCT 37.7 07/21/2024   MCV 76.6 (L) 07/21/2024   PLT 219 07/21/2024      Chemistry      Component Value Date/Time   NA 144 07/21/2024 0908   NA 144 08/30/2020 1550   K 3.4 (L) 07/21/2024 0908   CL 105 07/21/2024 0908   CO2 29 07/21/2024 0908   BUN 13 07/21/2024 0908   BUN 10 08/30/2020 1550   CREATININE 0.69 07/21/2024 0908   CREATININE 0.75 05/15/2015 1306      Component Value Date/Time   CALCIUM  9.4 07/21/2024 0908   ALKPHOS 105 07/21/2024 0908   AST 21 07/21/2024 0908   ALT 13 07/21/2024 0908   BILITOT 0.5 07/21/2024 0908       RADIOGRAPHIC STUDIES:  No results found.    ASSESSMENT/PLAN:  This is a very pleasant 79 year old Caucasian female with a history of stage Ia (T1c, N0, M0) non-small cell lung cancer, adenocarcinoma.  She was diagnosed in December 2022.  She status post left lower lobectomy and lymph node dissection under the care of Dr. Kerrin which was performed on 05/30/2021.  She is on observation and feeling fine and her next restaging CT scan of the chest is expected to be performed in July 2026.   She also has mild anemia.  She cannot take iron supplements.  Her iron storage at her last appointment did not require any IV iron she was found to have B12 deficiency.  She  had intolerance to B12 supplements with swelling.     She repeat CBC performed today.  CBC shows mild microcytic anemia with hemoglobin just slightly below the reference range at 11.9.  Her iron studies are normal.  Her ferritin is pending.  Her B12 is pending.  Unless her ferritin is low she likely does not need IV iron at this time.  Will wait for the results of her B12 and let her know next steps for this.  If this continues to be low she does have intolerance to dedicated B12 supplements p.o. and injection.  I did recommend that she at least start taking a low content B12 such as a women's once a day multivitamin if able to tolerate this without swelling.   We will see her back in July 2026 with a restaging CT scan.   The patient was advised to call immediately if she has any concerning symptoms in the interval. The patient voices understanding of current disease status and treatment options and is in agreement with the current care plan. All questions were answered. The patient knows to call the clinic with any problems, questions or concerns. We can certainly see the patient much sooner if necessary   Orders Placed This Encounter  Procedures   Ferritin    Standing Status:   Future    Expected Date:   01/18/2025    Expiration Date:   07/21/2025   Vitamin B12    Standing Status:   Future    Expected Date:   01/18/2025    Expiration Date:   07/21/2025   Iron and Iron Binding Capacity ([facility] only)    Standing Status:   Future    Expected Date:   01/18/2025    Expiration Date:   07/21/2025     The total time spent in the appointment was 20-29 minutes  Dillie Burandt L Brittian Renaldo, PA-C 07/21/24

## 2024-07-21 ENCOUNTER — Inpatient Hospital Stay

## 2024-07-21 ENCOUNTER — Telehealth: Payer: Self-pay

## 2024-07-21 ENCOUNTER — Inpatient Hospital Stay: Attending: Internal Medicine

## 2024-07-21 ENCOUNTER — Inpatient Hospital Stay: Admitting: Physician Assistant

## 2024-07-21 VITALS — BP 172/81 | HR 85 | Temp 97.7°F | Resp 18 | Wt 166.8 lb

## 2024-07-21 DIAGNOSIS — C3432 Malignant neoplasm of lower lobe, left bronchus or lung: Secondary | ICD-10-CM | POA: Diagnosis present

## 2024-07-21 DIAGNOSIS — Z7901 Long term (current) use of anticoagulants: Secondary | ICD-10-CM | POA: Insufficient documentation

## 2024-07-21 DIAGNOSIS — Z902 Acquired absence of lung [part of]: Secondary | ICD-10-CM | POA: Diagnosis not present

## 2024-07-21 DIAGNOSIS — D509 Iron deficiency anemia, unspecified: Secondary | ICD-10-CM | POA: Diagnosis not present

## 2024-07-21 DIAGNOSIS — Z86711 Personal history of pulmonary embolism: Secondary | ICD-10-CM | POA: Insufficient documentation

## 2024-07-21 DIAGNOSIS — E538 Deficiency of other specified B group vitamins: Secondary | ICD-10-CM | POA: Diagnosis not present

## 2024-07-21 LAB — CBC WITH DIFFERENTIAL (CANCER CENTER ONLY)
Abs Immature Granulocytes: 0.02 K/uL (ref 0.00–0.07)
Basophils Absolute: 0.1 K/uL (ref 0.0–0.1)
Basophils Relative: 1 %
Eosinophils Absolute: 0.2 K/uL (ref 0.0–0.5)
Eosinophils Relative: 3 %
HCT: 37.7 % (ref 36.0–46.0)
Hemoglobin: 11.9 g/dL — ABNORMAL LOW (ref 12.0–15.0)
Immature Granulocytes: 0 %
Lymphocytes Relative: 29 %
Lymphs Abs: 1.8 K/uL (ref 0.7–4.0)
MCH: 24.2 pg — ABNORMAL LOW (ref 26.0–34.0)
MCHC: 31.6 g/dL (ref 30.0–36.0)
MCV: 76.6 fL — ABNORMAL LOW (ref 80.0–100.0)
Monocytes Absolute: 0.6 K/uL (ref 0.1–1.0)
Monocytes Relative: 10 %
Neutro Abs: 3.7 K/uL (ref 1.7–7.7)
Neutrophils Relative %: 57 %
Platelet Count: 219 K/uL (ref 150–400)
RBC: 4.92 MIL/uL (ref 3.87–5.11)
RDW: 15 % (ref 11.5–15.5)
WBC Count: 6.5 K/uL (ref 4.0–10.5)
nRBC: 0 % (ref 0.0–0.2)

## 2024-07-21 LAB — CMP (CANCER CENTER ONLY)
ALT: 13 U/L (ref 0–44)
AST: 21 U/L (ref 15–41)
Albumin: 4.2 g/dL (ref 3.5–5.0)
Alkaline Phosphatase: 105 U/L (ref 38–126)
Anion gap: 10 (ref 5–15)
BUN: 13 mg/dL (ref 8–23)
CO2: 29 mmol/L (ref 22–32)
Calcium: 9.4 mg/dL (ref 8.9–10.3)
Chloride: 105 mmol/L (ref 98–111)
Creatinine: 0.69 mg/dL (ref 0.44–1.00)
GFR, Estimated: >60 mL/min
Glucose, Bld: 106 mg/dL — ABNORMAL HIGH (ref 70–99)
Potassium: 3.4 mmol/L — ABNORMAL LOW (ref 3.5–5.1)
Sodium: 144 mmol/L (ref 135–145)
Total Bilirubin: 0.5 mg/dL (ref 0.0–1.2)
Total Protein: 7.3 g/dL (ref 6.5–8.1)

## 2024-07-21 LAB — VITAMIN B12: Vitamin B-12: 413 pg/mL (ref 180–914)

## 2024-07-21 LAB — IRON AND IRON BINDING CAPACITY (CC-WL,HP ONLY)
Iron: 88 ug/dL (ref 28–170)
Saturation Ratios: 24 % (ref 10.4–31.8)
TIBC: 361 ug/dL (ref 250–450)
UIBC: 273 ug/dL

## 2024-07-21 LAB — FERRITIN: Ferritin: 15 ng/mL (ref 11–307)

## 2024-07-21 NOTE — Telephone Encounter (Signed)
 Spoke with patient regarding lab results. Per Cassie, PA, patients vitamin B12 level is within normal limits, and she may take a womens multivitamin if desired. Patient voiced understanding.

## 2024-08-01 ENCOUNTER — Encounter: Payer: Self-pay | Admitting: Physical Medicine & Rehabilitation

## 2024-08-01 ENCOUNTER — Encounter: Attending: Physical Medicine & Rehabilitation | Admitting: Physical Medicine & Rehabilitation

## 2024-08-01 VITALS — BP 174/82 | HR 64 | Ht 63.0 in | Wt 166.0 lb

## 2024-08-01 DIAGNOSIS — M1711 Unilateral primary osteoarthritis, right knee: Secondary | ICD-10-CM

## 2024-08-01 DIAGNOSIS — M1712 Unilateral primary osteoarthritis, left knee: Secondary | ICD-10-CM | POA: Diagnosis not present

## 2024-08-01 MED ORDER — BUPIVACAINE HCL (PF) 0.25 % IJ SOLN: 10.0000 mL | Freq: Once | INTRAMUSCULAR | Status: DC

## 2024-08-01 MED ORDER — LIDOCAINE HCL 1 % IJ SOLN
10.0000 mL | Freq: Once | INTRAMUSCULAR | Status: AC
  Administered 2024-08-01: 10 mL

## 2024-08-01 MED ORDER — BUPIVACAINE HCL (PF) 0.5 % IJ SOLN
10.0000 mL | Freq: Once | INTRAMUSCULAR | Status: AC
  Administered 2024-08-01: 10 mL

## 2024-08-01 MED ORDER — IOHEXOL 180 MG/ML  SOLN
3.0000 mL | Freq: Once | INTRAMUSCULAR | Status: AC
  Administered 2024-08-01: 3 mL

## 2024-08-01 NOTE — Progress Notes (Signed)
 Right  genicular nerve blocks under fluoroscopic guidance  Indication Chronic severe post operative knee pain that has not responded to PT, medication, and other conservative care.  Informed consent was obtained after discussing risks and benefits of procedure with the patient.  These include bleeding, bruising and infection as well as foot numbness. Pt placed in supine position on the fluoro table .  Static images identified distal femur and proximal tibia.  Lateral and medial supracondylar , as well as medial tibial flare prepped with betadine .  Marked under fluoro and then a 25 g 1.5 inch needle was used to anesthetize skin and subcutaneous tissue. 1.5 cc was infiltrate into each of 3 sites. Then a 22-gauge 3.5 inch needle was inserted under fluoroscopic guidance first targeting the medial tibial flare midpoint. After bone contact was made lateral images confirmed proper positioning and Isovue 200 times one ML was injected. This showed no evidence of intravascular uptake. Then 1.5 ML of the solution containing .5%marcaine  was injected. This same procedure was treated for the lateral and medial supracondylar areas. Patient tolerated procedure well. Post procedure instructions given.  Preinjection pain score 8 out of 10 Postinjection pain score 0 out of 10

## 2024-08-01 NOTE — Progress Notes (Signed)
" ° °  Subjective:    Patient ID: Laura [name], female    DOB: [birth year], 79 y.o.   MRN: [id number]  HPI    PROCEDURE RECORD  Physical Medicine and Rehabilitation   Name: ROWAN POLLMAN DOB:1946/01/26 MRN: [id number]  Date:08/01/2024  Physician: Dr. Carilyn    Nurse/CMA: Tye Kitty MA  Allergies: Allergies[1]  Consent Signed: Yes.    Is patient diabetic? Yes.    CBG today? Pre DM/ CBG not checked  Pregnant: No. LMP: No LMP recorded. Patient is postmenopausal. (age 85-55)  Anticoagulants: no Anti-inflammatory: no Antibiotics: no  Procedure: Right Genicular Knee Blocks  Position: Supine Start Time: 12:40  End Time: 1:09 PM  Fluoro Time: 1:25  RN/CMA Smith Potenza MA Schylar Wuebker MA    Time 12:33 pm 1:15 pm    BP 171/73 159/87    Pulse 64 72    Respirations 16 16    O2 Sat 95 95    S/S 66     Pain Level 8/10 0/10     D/C home with Sister, patient A & O X 3, D/C instructions reviewed, and sits independently.        Review of Systems     Objective:   Physical Exam        Assessment & Plan:       [1]  Allergies Allergen Reactions   Codeine Anaphylaxis, Hives and Other (See Comments)    Headache. Daughter reported that it caused her throat to swell up    Ciprofloxacin Nausea And Vomiting   Cyanocobalamin  Swelling    Lower extremity swelling   Vortioxetine Nausea Only    Other Reaction(s): dizziness   Ace Inhibitors Other (See Comments)    Unknown- it didn't agree with her    Citalopram  Other (See Comments)    Fatigue   Fetzima [Levomilnacipran] Other (See Comments)    Talking out of my head   Lasix [Furosemide] Other (See Comments)    HEADACHE   Linzess [Linaclotide] Nausea Only   Nsaids Other (See Comments)    Told not to take NSAIDs because of her heart    Sulfamethoxazole-Trimethoprim Other (See Comments)    NERVOUS AND DISORENTED   Xanax [place]] Other (See Comments)    confusion   "

## 2024-08-29 ENCOUNTER — Ambulatory Visit: Admitting: Cardiovascular Disease

## 2024-08-31 ENCOUNTER — Encounter: Admitting: Physical Medicine & Rehabilitation

## 2025-01-09 ENCOUNTER — Other Ambulatory Visit

## 2025-01-16 ENCOUNTER — Ambulatory Visit: Admitting: Internal Medicine
